# Patient Record
Sex: Female | Born: 1955
Health system: Southern US, Community
[De-identification: ages and names within clinical notes are randomized; demographics above are authoritative.]

## PROBLEM LIST (undated history)

## (undated) DIAGNOSIS — E1159 Type 2 diabetes mellitus with other circulatory complications: Secondary | ICD-10-CM

## (undated) DIAGNOSIS — K3184 Gastroparesis: Secondary | ICD-10-CM

## (undated) DIAGNOSIS — M545 Low back pain, unspecified: Secondary | ICD-10-CM

## (undated) DIAGNOSIS — N9489 Other specified conditions associated with female genital organs and menstrual cycle: Secondary | ICD-10-CM

## (undated) DIAGNOSIS — K760 Fatty (change of) liver, not elsewhere classified: Secondary | ICD-10-CM

## (undated) DIAGNOSIS — E1169 Type 2 diabetes mellitus with other specified complication: Secondary | ICD-10-CM

## (undated) DIAGNOSIS — E119 Type 2 diabetes mellitus without complications: Secondary | ICD-10-CM

## (undated) DIAGNOSIS — G35D Multiple sclerosis, unspecified: Secondary | ICD-10-CM

## (undated) DIAGNOSIS — K219 Gastro-esophageal reflux disease without esophagitis: Secondary | ICD-10-CM

## (undated) DIAGNOSIS — I10 Essential (primary) hypertension: Secondary | ICD-10-CM

## (undated) DIAGNOSIS — E669 Obesity, unspecified: Secondary | ICD-10-CM

## (undated) DIAGNOSIS — R0600 Dyspnea, unspecified: Secondary | ICD-10-CM

## (undated) DIAGNOSIS — G8929 Other chronic pain: Secondary | ICD-10-CM

## (undated) DIAGNOSIS — M1990 Unspecified osteoarthritis, unspecified site: Secondary | ICD-10-CM

## (undated) DIAGNOSIS — D509 Iron deficiency anemia, unspecified: Secondary | ICD-10-CM

## (undated) DIAGNOSIS — G35 Multiple sclerosis: Secondary | ICD-10-CM

## (undated) DIAGNOSIS — G4730 Sleep apnea, unspecified: Secondary | ICD-10-CM

## (undated) HISTORY — DX: Obesity, unspecified: E66.9

## (undated) HISTORY — DX: Other chronic pain: G89.29

## (undated) HISTORY — DX: Gastro-esophageal reflux disease without esophagitis: K21.9

## (undated) HISTORY — DX: Low back pain, unspecified: M54.50

## (undated) HISTORY — DX: Essential (primary) hypertension: I10

## (undated) HISTORY — PX: TENDON LENGTHENING: SHX395

## (undated) HISTORY — PX: UMBILICAL HERNIA REPAIR: SHX196

## (undated) HISTORY — PX: BLADDER SUSPENSION: SHX72

## (undated) HISTORY — PX: CHOLECYSTECTOMY: SHX55

## (undated) HISTORY — PX: KNEE ARTHROSCOPY W/ DEBRIDEMENT: SHX1867

## (undated) HISTORY — DX: Multiple sclerosis: G35

## (undated) HISTORY — DX: Sleep apnea, unspecified: G47.30

## (undated) HISTORY — DX: Iron deficiency anemia, unspecified: D50.9

## (undated) HISTORY — DX: Multiple sclerosis, unspecified: G35.D

## (undated) HISTORY — DX: Type 2 diabetes mellitus with other circulatory complications: E11.59

## (undated) HISTORY — DX: Other specified conditions associated with female genital organs and menstrual cycle: N94.89

## (undated) HISTORY — DX: Type 2 diabetes mellitus without complications: E11.9

## (undated) HISTORY — PX: LAPAROSCOPY ABDOMEN DIAGNOSTIC: PRO50

## (undated) HISTORY — DX: Fatty (change of) liver, not elsewhere classified: K76.0

## (undated) HISTORY — DX: Gastroparesis: K31.84

## (undated) HISTORY — DX: Low back pain: M54.5

## (undated) HISTORY — PX: ROTATOR CUFF REPAIR: SHX139

## (undated) HISTORY — DX: Type 2 diabetes mellitus with other specified complication: E11.69

## (undated) HISTORY — PX: DILATION AND CURETTAGE OF UTERUS: SHX78

---
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
## 2000-10-02 ENCOUNTER — Encounter: Payer: Self-pay | Admitting: Family Medicine

## 2000-10-02 ENCOUNTER — Ambulatory Visit (HOSPITAL_COMMUNITY): Admission: RE | Admit: 2000-10-02 | Discharge: 2000-10-02 | Payer: Self-pay | Admitting: Family Medicine

## 2000-11-12 ENCOUNTER — Ambulatory Visit (HOSPITAL_COMMUNITY): Admission: RE | Admit: 2000-11-12 | Discharge: 2000-11-12 | Payer: Self-pay | Admitting: Family Medicine

## 2000-11-12 ENCOUNTER — Encounter: Payer: Self-pay | Admitting: Family Medicine

## 2001-05-02 ENCOUNTER — Ambulatory Visit (HOSPITAL_COMMUNITY): Admission: RE | Admit: 2001-05-02 | Discharge: 2001-05-02 | Payer: Self-pay | Admitting: Family Medicine

## 2001-05-02 ENCOUNTER — Encounter: Payer: Self-pay | Admitting: Family Medicine

## 2001-05-15 ENCOUNTER — Ambulatory Visit (HOSPITAL_BASED_OUTPATIENT_CLINIC_OR_DEPARTMENT_OTHER): Admission: RE | Admit: 2001-05-15 | Discharge: 2001-05-15 | Payer: Self-pay | Admitting: Orthopedic Surgery

## 2001-10-24 ENCOUNTER — Ambulatory Visit (HOSPITAL_COMMUNITY): Admission: RE | Admit: 2001-10-24 | Discharge: 2001-10-24 | Payer: Self-pay | Admitting: Orthopedic Surgery

## 2001-10-24 ENCOUNTER — Encounter: Payer: Self-pay | Admitting: Orthopedic Surgery

## 2001-12-29 ENCOUNTER — Ambulatory Visit (HOSPITAL_COMMUNITY): Admission: RE | Admit: 2001-12-29 | Discharge: 2001-12-29 | Payer: Self-pay | Admitting: Unknown Physician Specialty

## 2001-12-29 ENCOUNTER — Encounter: Payer: Self-pay | Admitting: Family Medicine

## 2002-01-14 ENCOUNTER — Ambulatory Visit (HOSPITAL_BASED_OUTPATIENT_CLINIC_OR_DEPARTMENT_OTHER): Admission: RE | Admit: 2002-01-14 | Discharge: 2002-01-14 | Payer: Self-pay | Admitting: Orthopedic Surgery

## 2002-01-27 ENCOUNTER — Encounter (HOSPITAL_COMMUNITY): Admission: RE | Admit: 2002-01-27 | Discharge: 2002-02-26 | Payer: Self-pay | Admitting: Orthopedic Surgery

## 2002-02-27 ENCOUNTER — Encounter (HOSPITAL_COMMUNITY): Admission: RE | Admit: 2002-02-27 | Discharge: 2002-03-29 | Payer: Self-pay | Admitting: Orthopedic Surgery

## 2002-04-08 ENCOUNTER — Ambulatory Visit (HOSPITAL_COMMUNITY): Admission: RE | Admit: 2002-04-08 | Discharge: 2002-04-08 | Payer: Self-pay | Admitting: Orthopedic Surgery

## 2002-04-08 ENCOUNTER — Encounter: Payer: Self-pay | Admitting: Orthopedic Surgery

## 2002-04-16 ENCOUNTER — Ambulatory Visit (HOSPITAL_COMMUNITY): Admission: RE | Admit: 2002-04-16 | Discharge: 2002-04-16 | Payer: Self-pay | Admitting: Orthopedic Surgery

## 2002-04-16 ENCOUNTER — Encounter: Payer: Self-pay | Admitting: Orthopedic Surgery

## 2002-06-02 ENCOUNTER — Encounter: Payer: Self-pay | Admitting: Neurology

## 2002-06-02 ENCOUNTER — Ambulatory Visit (HOSPITAL_COMMUNITY): Admission: RE | Admit: 2002-06-02 | Discharge: 2002-06-02 | Payer: Self-pay | Admitting: Neurology

## 2002-06-10 ENCOUNTER — Encounter: Payer: Self-pay | Admitting: Family Medicine

## 2002-06-10 ENCOUNTER — Ambulatory Visit (HOSPITAL_COMMUNITY): Admission: RE | Admit: 2002-06-10 | Discharge: 2002-06-10 | Payer: Self-pay | Admitting: Family Medicine

## 2003-06-24 ENCOUNTER — Emergency Department (HOSPITAL_COMMUNITY): Admission: EM | Admit: 2003-06-24 | Discharge: 2003-06-25 | Payer: Self-pay | Admitting: Emergency Medicine

## 2003-07-08 ENCOUNTER — Ambulatory Visit (HOSPITAL_COMMUNITY): Admission: RE | Admit: 2003-07-08 | Discharge: 2003-07-08 | Payer: Self-pay | Admitting: Family Medicine

## 2003-08-13 ENCOUNTER — Ambulatory Visit (HOSPITAL_COMMUNITY): Admission: RE | Admit: 2003-08-13 | Discharge: 2003-08-13 | Payer: Self-pay | Admitting: Family Medicine

## 2003-11-02 ENCOUNTER — Other Ambulatory Visit: Admission: RE | Admit: 2003-11-02 | Discharge: 2003-11-02 | Payer: Self-pay | Admitting: Obstetrics and Gynecology

## 2003-12-17 ENCOUNTER — Ambulatory Visit (HOSPITAL_COMMUNITY): Admission: RE | Admit: 2003-12-17 | Discharge: 2003-12-17 | Payer: Self-pay | Admitting: Otolaryngology

## 2003-12-23 ENCOUNTER — Ambulatory Visit (HOSPITAL_COMMUNITY): Admission: RE | Admit: 2003-12-23 | Discharge: 2003-12-23 | Payer: Self-pay | Admitting: General Surgery

## 2004-01-26 ENCOUNTER — Ambulatory Visit: Payer: Self-pay | Admitting: Family Medicine

## 2004-04-11 ENCOUNTER — Ambulatory Visit: Payer: Self-pay | Admitting: Family Medicine

## 2004-06-20 ENCOUNTER — Ambulatory Visit: Payer: Self-pay | Admitting: Family Medicine

## 2004-06-30 ENCOUNTER — Ambulatory Visit (HOSPITAL_COMMUNITY): Admission: RE | Admit: 2004-06-30 | Discharge: 2004-06-30 | Payer: Self-pay | Admitting: Family Medicine

## 2004-07-13 ENCOUNTER — Ambulatory Visit (HOSPITAL_COMMUNITY): Admission: RE | Admit: 2004-07-13 | Discharge: 2004-07-13 | Payer: Self-pay | Admitting: Family Medicine

## 2004-07-17 ENCOUNTER — Ambulatory Visit (HOSPITAL_COMMUNITY): Admission: RE | Admit: 2004-07-17 | Discharge: 2004-07-17 | Payer: Self-pay | Admitting: Family Medicine

## 2004-09-19 ENCOUNTER — Ambulatory Visit: Payer: Self-pay | Admitting: Family Medicine

## 2004-10-03 ENCOUNTER — Ambulatory Visit: Payer: Self-pay | Admitting: Family Medicine

## 2004-10-05 ENCOUNTER — Ambulatory Visit: Payer: Self-pay | Admitting: Internal Medicine

## 2004-10-09 ENCOUNTER — Ambulatory Visit (HOSPITAL_COMMUNITY): Admission: RE | Admit: 2004-10-09 | Discharge: 2004-10-09 | Payer: Self-pay | Admitting: Internal Medicine

## 2004-10-09 ENCOUNTER — Ambulatory Visit: Payer: Self-pay | Admitting: Internal Medicine

## 2004-10-11 ENCOUNTER — Ambulatory Visit (HOSPITAL_COMMUNITY): Admission: RE | Admit: 2004-10-11 | Discharge: 2004-10-11 | Payer: Self-pay | Admitting: Internal Medicine

## 2004-11-07 ENCOUNTER — Ambulatory Visit: Payer: Self-pay | Admitting: Family Medicine

## 2004-11-15 ENCOUNTER — Ambulatory Visit: Payer: Self-pay | Admitting: Internal Medicine

## 2004-12-22 ENCOUNTER — Ambulatory Visit: Payer: Self-pay | Admitting: Family Medicine

## 2005-03-26 ENCOUNTER — Ambulatory Visit: Payer: Self-pay | Admitting: Internal Medicine

## 2005-04-06 ENCOUNTER — Ambulatory Visit: Payer: Self-pay | Admitting: Family Medicine

## 2005-05-09 ENCOUNTER — Encounter (HOSPITAL_COMMUNITY): Admission: RE | Admit: 2005-05-09 | Discharge: 2005-06-08 | Payer: Self-pay | Admitting: *Deleted

## 2005-05-18 ENCOUNTER — Ambulatory Visit: Payer: Self-pay | Admitting: Family Medicine

## 2005-05-21 ENCOUNTER — Ambulatory Visit: Admission: RE | Admit: 2005-05-21 | Discharge: 2005-05-21 | Payer: Self-pay | Admitting: *Deleted

## 2005-05-29 ENCOUNTER — Ambulatory Visit: Payer: Self-pay | Admitting: Pulmonary Disease

## 2005-07-02 ENCOUNTER — Ambulatory Visit (HOSPITAL_COMMUNITY): Admission: RE | Admit: 2005-07-02 | Discharge: 2005-07-02 | Payer: Self-pay | Admitting: Neurology

## 2005-07-12 ENCOUNTER — Ambulatory Visit: Payer: Self-pay | Admitting: Family Medicine

## 2005-07-12 ENCOUNTER — Ambulatory Visit (HOSPITAL_COMMUNITY): Admission: RE | Admit: 2005-07-12 | Discharge: 2005-07-12 | Payer: Self-pay | Admitting: Family Medicine

## 2005-07-19 ENCOUNTER — Ambulatory Visit (HOSPITAL_COMMUNITY): Admission: RE | Admit: 2005-07-19 | Discharge: 2005-07-19 | Payer: Self-pay | Admitting: Family Medicine

## 2005-10-12 ENCOUNTER — Ambulatory Visit: Payer: Self-pay | Admitting: Family Medicine

## 2005-10-30 ENCOUNTER — Ambulatory Visit: Payer: Self-pay | Admitting: Family Medicine

## 2005-12-20 ENCOUNTER — Ambulatory Visit: Payer: Self-pay | Admitting: Family Medicine

## 2005-12-21 ENCOUNTER — Ambulatory Visit (HOSPITAL_COMMUNITY): Admission: RE | Admit: 2005-12-21 | Discharge: 2005-12-21 | Payer: Self-pay | Admitting: Family Medicine

## 2005-12-24 ENCOUNTER — Other Ambulatory Visit: Admission: RE | Admit: 2005-12-24 | Discharge: 2005-12-24 | Payer: Self-pay | Admitting: Family Medicine

## 2005-12-24 ENCOUNTER — Encounter: Payer: Self-pay | Admitting: Family Medicine

## 2005-12-24 ENCOUNTER — Ambulatory Visit: Payer: Self-pay | Admitting: Family Medicine

## 2005-12-24 LAB — CONVERTED CEMR LAB: Pap Smear: NORMAL

## 2005-12-28 ENCOUNTER — Encounter: Payer: Self-pay | Admitting: Internal Medicine

## 2006-01-28 ENCOUNTER — Ambulatory Visit: Payer: Self-pay | Admitting: Family Medicine

## 2006-02-20 ENCOUNTER — Encounter: Payer: Self-pay | Admitting: Internal Medicine

## 2006-03-19 ENCOUNTER — Ambulatory Visit: Payer: Self-pay | Admitting: Internal Medicine

## 2006-04-29 ENCOUNTER — Ambulatory Visit: Payer: Self-pay | Admitting: Family Medicine

## 2006-04-29 LAB — CONVERTED CEMR LAB
ALT: 15 units/L (ref 0–35)
AST: 14 units/L (ref 0–37)
Albumin: 4.5 g/dL (ref 3.5–5.2)
Alkaline Phosphatase: 86 units/L (ref 39–117)
BUN: 24 mg/dL — ABNORMAL HIGH (ref 6–23)
Basophils Absolute: 0.1 10*3/uL (ref 0.0–0.1)
Basophils Relative: 1 % (ref 0–1)
Bilirubin, Direct: 0.1 mg/dL (ref 0.0–0.3)
CO2: 22 meq/L (ref 19–32)
Calcium: 9.7 mg/dL (ref 8.4–10.5)
Chloride: 104 meq/L (ref 96–112)
Cholesterol: 156 mg/dL (ref 0–200)
Creatinine, Ser: 0.77 mg/dL (ref 0.40–1.20)
Eosinophils Absolute: 0.1 10*3/uL (ref 0.0–0.7)
Eosinophils Relative: 1 % (ref 0–5)
Ferritin: 10 ng/mL (ref 10–291)
Folate: >20 ng/mL
Glucose, Bld: 102 mg/dL — ABNORMAL HIGH (ref 70–99)
HCT: 36.1 % (ref 36.0–46.0)
HDL: 56 mg/dL (ref >39)
Hemoglobin: 11.3 g/dL — ABNORMAL LOW (ref 12.0–15.0)
Hgb A1c MFr Bld: 7.2 % — ABNORMAL HIGH (ref 4.6–6.1)
Indirect Bilirubin: 0.3 mg/dL (ref 0.0–0.9)
Iron: 26 ug/dL — ABNORMAL LOW (ref 42–145)
LDL Cholesterol: 87 mg/dL (ref 0–99)
Lymphocytes Relative: 36 % (ref 12–46)
Lymphs Abs: 3 10*3/uL (ref 0.7–3.3)
MCHC: 31.3 g/dL (ref 30.0–36.0)
MCV: 78.3 fL (ref 78.0–100.0)
Microalb, Ur: 1.14 mg/dL (ref 0.00–1.89)
Monocytes Absolute: 0.4 10*3/uL (ref 0.2–0.7)
Monocytes Relative: 5 % (ref 3–11)
Neutro Abs: 4.8 10*3/uL (ref 1.7–7.7)
Neutrophils Relative %: 58 % (ref 43–77)
Platelets: 486 10*3/uL — ABNORMAL HIGH (ref 150–400)
Potassium: 4.5 meq/L (ref 3.5–5.3)
RBC: 4.61 M/uL (ref 3.87–5.11)
RDW: 17.9 % — ABNORMAL HIGH (ref 11.5–14.0)
Retic Count, Absolute: 55.3 (ref 19.0–186.0)
Retic Ct Pct: 1.2 % (ref 0.4–3.1)
Saturation Ratios: 6 % — ABNORMAL LOW (ref 20–55)
Sodium: 140 meq/L (ref 135–145)
TIBC: 401 ug/dL (ref 250–470)
Total Bilirubin: 0.4 mg/dL (ref 0.3–1.2)
Total CHOL/HDL Ratio: 2.8
Total Protein: 7.9 g/dL (ref 6.0–8.3)
Triglycerides: 64 mg/dL (ref <150)
UIBC: 375 ug/dL
VLDL: 13 mg/dL (ref 0–40)
Vitamin B-12: 456 pg/mL (ref 211–911)
WBC: 8.3 10*3/uL (ref 4.0–10.5)

## 2006-05-23 ENCOUNTER — Ambulatory Visit (HOSPITAL_COMMUNITY): Admission: RE | Admit: 2006-05-23 | Discharge: 2006-05-23 | Payer: Self-pay | Admitting: Family Medicine

## 2006-05-23 ENCOUNTER — Ambulatory Visit: Payer: Self-pay | Admitting: Family Medicine

## 2006-05-23 LAB — CONVERTED CEMR LAB
BUN: 10 mg/dL (ref 6–23)
Basophils Absolute: 0 10*3/uL (ref 0.0–0.1)
Basophils Relative: 0 % (ref 0–1)
CO2: 22 meq/L (ref 19–32)
Calcium: 8.8 mg/dL (ref 8.4–10.5)
Chloride: 99 meq/L (ref 96–112)
Creatinine, Ser: 1 mg/dL (ref 0.40–1.20)
Eosinophils Absolute: 0 10*3/uL (ref 0.0–0.7)
Eosinophils Relative: 0 % (ref 0–5)
Glucose, Bld: 110 mg/dL — ABNORMAL HIGH (ref 70–99)
HCT: 32.7 % — ABNORMAL LOW (ref 36.0–46.0)
Hemoglobin: 10.9 g/dL — ABNORMAL LOW (ref 12.0–15.0)
Lymphocytes Relative: 10 % — ABNORMAL LOW (ref 12–46)
Lymphs Abs: 1.4 10*3/uL (ref 0.7–3.3)
MCHC: 33.1 g/dL (ref 30.0–36.0)
MCV: 77.2 fL — ABNORMAL LOW (ref 78.0–100.0)
Monocytes Absolute: 0.9 10*3/uL — ABNORMAL HIGH (ref 0.2–0.7)
Monocytes Relative: 7 % (ref 3–11)
Neutro Abs: 10.1 10*3/uL — ABNORMAL HIGH (ref 1.7–7.7)
Neutrophils Relative %: 82 % — ABNORMAL HIGH (ref 43–77)
Platelets: 449 10*3/uL — ABNORMAL HIGH (ref 150–400)
Potassium: 3.7 meq/L (ref 3.5–5.3)
RBC: 4.24 M/uL (ref 3.87–5.11)
RDW: 18.6 % — ABNORMAL HIGH (ref 11.5–14.0)
Sodium: 131 meq/L — ABNORMAL LOW (ref 135–145)
WBC: 12.4 10*3/uL — ABNORMAL HIGH (ref 4.0–10.5)

## 2006-05-24 ENCOUNTER — Encounter: Payer: Self-pay | Admitting: Family Medicine

## 2006-06-05 ENCOUNTER — Ambulatory Visit: Payer: Self-pay | Admitting: Family Medicine

## 2006-06-05 LAB — CONVERTED CEMR LAB
Basophils Absolute: 0 10*3/uL (ref 0.0–0.1)
Basophils Relative: 1 % (ref 0–1)
Eosinophils Absolute: 0 10*3/uL (ref 0.0–0.7)
Eosinophils Relative: 0 % (ref 0–5)
HCT: 35.6 % — ABNORMAL LOW (ref 36.0–46.0)
Hemoglobin: 11.3 g/dL — ABNORMAL LOW (ref 12.0–15.0)
Lymphocytes Relative: 32 % (ref 12–46)
Lymphs Abs: 2.8 10*3/uL (ref 0.7–3.3)
MCHC: 31.7 g/dL (ref 30.0–36.0)
MCV: 79.1 fL (ref 78.0–100.0)
Monocytes Absolute: 0.4 10*3/uL (ref 0.2–0.7)
Monocytes Relative: 4 % (ref 3–11)
Neutro Abs: 5.4 10*3/uL (ref 1.7–7.7)
Neutrophils Relative %: 63 % (ref 43–77)
Platelets: 541 10*3/uL — ABNORMAL HIGH (ref 150–400)
RBC: 4.5 M/uL (ref 3.87–5.11)
RDW: 19.3 % — ABNORMAL HIGH (ref 11.5–14.0)
WBC: 8.5 10*3/uL (ref 4.0–10.5)

## 2006-06-20 ENCOUNTER — Encounter: Payer: Self-pay | Admitting: Family Medicine

## 2006-07-01 ENCOUNTER — Ambulatory Visit (HOSPITAL_COMMUNITY): Admission: RE | Admit: 2006-07-01 | Discharge: 2006-07-01 | Payer: Self-pay | Admitting: Family Medicine

## 2006-07-02 ENCOUNTER — Ambulatory Visit (HOSPITAL_COMMUNITY): Admission: RE | Admit: 2006-07-02 | Discharge: 2006-07-02 | Payer: Self-pay | Admitting: Family Medicine

## 2006-07-22 ENCOUNTER — Ambulatory Visit (HOSPITAL_COMMUNITY): Admission: RE | Admit: 2006-07-22 | Discharge: 2006-07-22 | Payer: Self-pay | Admitting: Family Medicine

## 2006-08-01 ENCOUNTER — Encounter: Payer: Self-pay | Admitting: Family Medicine

## 2006-08-01 LAB — CONVERTED CEMR LAB
BUN: 17 mg/dL (ref 6–23)
CO2: 19 meq/L (ref 19–32)
Calcium: 9.7 mg/dL (ref 8.4–10.5)
Chloride: 106 meq/L (ref 96–112)
Creatinine, Ser: 0.72 mg/dL (ref 0.40–1.20)
Glucose, Bld: 86 mg/dL (ref 70–99)
Hgb A1c MFr Bld: 6.6 % — ABNORMAL HIGH (ref 4.6–6.1)
Potassium: 4.9 meq/L (ref 3.5–5.3)
Sodium: 141 meq/L (ref 135–145)

## 2006-08-07 ENCOUNTER — Ambulatory Visit: Payer: Self-pay | Admitting: Family Medicine

## 2006-12-11 ENCOUNTER — Ambulatory Visit: Payer: Self-pay | Admitting: Family Medicine

## 2006-12-18 ENCOUNTER — Ambulatory Visit (HOSPITAL_COMMUNITY): Admission: RE | Admit: 2006-12-18 | Discharge: 2006-12-18 | Payer: Self-pay | Admitting: Family Medicine

## 2007-02-11 ENCOUNTER — Ambulatory Visit: Payer: Self-pay | Admitting: Family Medicine

## 2007-02-21 ENCOUNTER — Encounter: Payer: Self-pay | Admitting: Family Medicine

## 2007-03-13 DIAGNOSIS — G4730 Sleep apnea, unspecified: Secondary | ICD-10-CM

## 2007-03-13 HISTORY — DX: Sleep apnea, unspecified: G47.30

## 2007-03-14 ENCOUNTER — Encounter: Payer: Self-pay | Admitting: Family Medicine

## 2007-03-14 LAB — CONVERTED CEMR LAB
ALT: 38 units/L — ABNORMAL HIGH (ref 0–35)
AST: 28 units/L (ref 0–37)
Albumin: 4.4 g/dL (ref 3.5–5.2)
Alkaline Phosphatase: 86 units/L (ref 39–117)
BUN: 10 mg/dL (ref 6–23)
Bilirubin, Direct: 0.1 mg/dL (ref 0.0–0.3)
CO2: 23 meq/L (ref 19–32)
Calcium: 9.3 mg/dL (ref 8.4–10.5)
Chloride: 106 meq/L (ref 96–112)
Cholesterol: 177 mg/dL (ref 0–200)
Creatinine, Ser: 0.68 mg/dL (ref 0.40–1.20)
Glucose, Bld: 109 mg/dL — ABNORMAL HIGH (ref 70–99)
HDL: 51 mg/dL (ref >39)
Indirect Bilirubin: 0.2 mg/dL (ref 0.0–0.9)
LDL Cholesterol: 106 mg/dL — ABNORMAL HIGH (ref 0–99)
Potassium: 4.5 meq/L (ref 3.5–5.3)
Sodium: 141 meq/L (ref 135–145)
Total Bilirubin: 0.3 mg/dL (ref 0.3–1.2)
Total CHOL/HDL Ratio: 3.5
Total Protein: 7.7 g/dL (ref 6.0–8.3)
Triglycerides: 98 mg/dL (ref <150)
VLDL: 20 mg/dL (ref 0–40)

## 2007-03-17 ENCOUNTER — Encounter: Payer: Self-pay | Admitting: Family Medicine

## 2007-03-17 ENCOUNTER — Ambulatory Visit: Payer: Self-pay | Admitting: Family Medicine

## 2007-03-17 ENCOUNTER — Other Ambulatory Visit: Admission: RE | Admit: 2007-03-17 | Discharge: 2007-03-17 | Payer: Self-pay | Admitting: Family Medicine

## 2007-03-17 ENCOUNTER — Ambulatory Visit (HOSPITAL_COMMUNITY): Admission: RE | Admit: 2007-03-17 | Discharge: 2007-03-17 | Payer: Self-pay | Admitting: Family Medicine

## 2007-03-17 LAB — CONVERTED CEMR LAB: Pap Smear: NORMAL

## 2007-03-20 ENCOUNTER — Ambulatory Visit (HOSPITAL_COMMUNITY): Admission: RE | Admit: 2007-03-20 | Discharge: 2007-03-20 | Payer: Self-pay | Admitting: Family Medicine

## 2007-04-18 ENCOUNTER — Ambulatory Visit: Payer: Self-pay | Admitting: Internal Medicine

## 2007-04-18 DIAGNOSIS — J309 Allergic rhinitis, unspecified: Secondary | ICD-10-CM | POA: Insufficient documentation

## 2007-04-18 LAB — CONVERTED CEMR LAB
Basophils Absolute: 0.1 10*3/uL (ref 0.0–0.1)
Basophils Relative: 1 % (ref 0.0–1.0)
Eosinophils Absolute: 0 10*3/uL (ref 0.0–0.6)
Eosinophils Relative: 0.4 % (ref 0.0–5.0)
HCT: 35.1 % — ABNORMAL LOW (ref 36.0–46.0)
Hemoglobin: 11.5 g/dL — ABNORMAL LOW (ref 12.0–15.0)
IgE (Immunoglobulin E), Serum: 132 intl units/mL (ref 0.0–180.0)
Lymphocytes Relative: 30 % (ref 12.0–46.0)
MCHC: 32.9 g/dL (ref 30.0–36.0)
MCV: 81.7 fL (ref 78.0–100.0)
Monocytes Absolute: 0.5 10*3/uL (ref 0.2–0.7)
Monocytes Relative: 7.6 % (ref 3.0–11.0)
Neutro Abs: 4.3 10*3/uL (ref 1.4–7.7)
Neutrophils Relative %: 61 % (ref 43.0–77.0)
Platelets: 446 10*3/uL — ABNORMAL HIGH (ref 150–400)
RBC: 4.3 M/uL (ref 3.87–5.11)
RDW: 14.6 % (ref 11.5–14.6)
WBC: 7 10*3/uL (ref 4.5–10.5)

## 2007-04-20 DIAGNOSIS — E1143 Type 2 diabetes mellitus with diabetic autonomic (poly)neuropathy: Secondary | ICD-10-CM | POA: Insufficient documentation

## 2007-04-20 DIAGNOSIS — I10 Essential (primary) hypertension: Secondary | ICD-10-CM | POA: Insufficient documentation

## 2007-04-20 DIAGNOSIS — E1165 Type 2 diabetes mellitus with hyperglycemia: Secondary | ICD-10-CM

## 2007-05-09 ENCOUNTER — Encounter: Payer: Self-pay | Admitting: Internal Medicine

## 2007-05-09 ENCOUNTER — Ambulatory Visit: Admission: RE | Admit: 2007-05-09 | Discharge: 2007-05-09 | Payer: Self-pay | Admitting: Internal Medicine

## 2007-05-15 ENCOUNTER — Ambulatory Visit: Payer: Self-pay | Admitting: Family Medicine

## 2007-05-16 ENCOUNTER — Ambulatory Visit: Payer: Self-pay | Admitting: Internal Medicine

## 2007-05-16 DIAGNOSIS — G4733 Obstructive sleep apnea (adult) (pediatric): Secondary | ICD-10-CM | POA: Insufficient documentation

## 2007-05-17 ENCOUNTER — Ambulatory Visit: Payer: Self-pay | Admitting: Internal Medicine

## 2007-06-03 ENCOUNTER — Encounter: Payer: Self-pay | Admitting: Internal Medicine

## 2007-06-16 ENCOUNTER — Ambulatory Visit: Payer: Self-pay | Admitting: Internal Medicine

## 2007-06-16 DIAGNOSIS — G35 Multiple sclerosis: Secondary | ICD-10-CM | POA: Insufficient documentation

## 2007-06-26 ENCOUNTER — Ambulatory Visit: Payer: Self-pay | Admitting: Family Medicine

## 2007-06-27 ENCOUNTER — Encounter: Payer: Self-pay | Admitting: Family Medicine

## 2007-06-27 LAB — CONVERTED CEMR LAB: Microalb, Ur: 3.1 mg/dL — ABNORMAL HIGH (ref 0.00–1.89)

## 2007-07-24 ENCOUNTER — Ambulatory Visit (HOSPITAL_COMMUNITY): Admission: RE | Admit: 2007-07-24 | Discharge: 2007-07-24 | Payer: Self-pay | Admitting: Family Medicine

## 2007-12-19 ENCOUNTER — Ambulatory Visit: Payer: Self-pay | Admitting: Family Medicine

## 2007-12-19 LAB — CONVERTED CEMR LAB
Blood Glucose, Fasting: 159 mg/dL
Hgb A1c MFr Bld: 8.8 %

## 2007-12-29 ENCOUNTER — Encounter: Payer: Self-pay | Admitting: Family Medicine

## 2007-12-30 ENCOUNTER — Encounter: Payer: Self-pay | Admitting: Family Medicine

## 2007-12-30 LAB — CONVERTED CEMR LAB
ALT: 37 units/L — ABNORMAL HIGH (ref 0–35)
AST: 24 units/L (ref 0–37)
Albumin: 4.5 g/dL (ref 3.5–5.2)
Alkaline Phosphatase: 72 units/L (ref 39–117)
BUN: 11 mg/dL (ref 6–23)
Bilirubin, Direct: 0.1 mg/dL (ref 0.0–0.3)
CO2: 20 meq/L (ref 19–32)
Calcium: 9.5 mg/dL (ref 8.4–10.5)
Chloride: 106 meq/L (ref 96–112)
Cholesterol: 156 mg/dL (ref 0–200)
Creatinine, Ser: 0.74 mg/dL (ref 0.40–1.20)
Glucose, Bld: 94 mg/dL (ref 70–99)
HDL: 57 mg/dL (ref >39)
Indirect Bilirubin: 0.2 mg/dL (ref 0.0–0.9)
LDL Cholesterol: 89 mg/dL (ref 0–99)
Potassium: 4.6 meq/L (ref 3.5–5.3)
Sodium: 140 meq/L (ref 135–145)
Total Bilirubin: 0.3 mg/dL (ref 0.3–1.2)
Total CHOL/HDL Ratio: 2.7
Total Protein: 7.4 g/dL (ref 6.0–8.3)
Triglycerides: 49 mg/dL (ref <150)
VLDL: 10 mg/dL (ref 0–40)

## 2008-02-02 ENCOUNTER — Ambulatory Visit: Payer: Self-pay | Admitting: Family Medicine

## 2008-02-04 LAB — CONVERTED CEMR LAB
ALT: 20 units/L (ref 0–35)
AST: 17 units/L (ref 0–37)
Albumin: 4.6 g/dL (ref 3.5–5.2)
Alkaline Phosphatase: 79 units/L (ref 39–117)
Bilirubin, Direct: 0.1 mg/dL (ref 0.0–0.3)
Indirect Bilirubin: 0.3 mg/dL (ref 0.0–0.9)
Total Bilirubin: 0.4 mg/dL (ref 0.3–1.2)
Total Protein: 7.7 g/dL (ref 6.0–8.3)

## 2008-03-12 HISTORY — PX: ESOPHAGOGASTRODUODENOSCOPY: SHX1529

## 2008-03-12 HISTORY — PX: COLONOSCOPY: SHX174

## 2008-03-31 ENCOUNTER — Ambulatory Visit: Payer: Self-pay | Admitting: Family Medicine

## 2008-03-31 LAB — CONVERTED CEMR LAB
Blood Glucose, Fasting: 118 mg/dL
Hgb A1c MFr Bld: 6.7 %

## 2008-04-21 ENCOUNTER — Encounter: Payer: Self-pay | Admitting: Family Medicine

## 2008-05-12 ENCOUNTER — Other Ambulatory Visit: Admission: RE | Admit: 2008-05-12 | Discharge: 2008-05-12 | Payer: Self-pay | Admitting: Obstetrics and Gynecology

## 2008-05-25 ENCOUNTER — Ambulatory Visit (HOSPITAL_COMMUNITY): Admission: RE | Admit: 2008-05-25 | Discharge: 2008-05-25 | Payer: Self-pay | Admitting: Obstetrics and Gynecology

## 2008-06-21 ENCOUNTER — Telehealth: Payer: Self-pay | Admitting: Family Medicine

## 2008-07-01 ENCOUNTER — Ambulatory Visit: Payer: Self-pay | Admitting: Family Medicine

## 2008-07-01 LAB — CONVERTED CEMR LAB
Blood Glucose, Fasting: 97 mg/dL
Hgb A1c MFr Bld: 7 %

## 2008-07-06 LAB — CONVERTED CEMR LAB
ALT: 18 units/L (ref 0–35)
AST: 18 units/L (ref 0–37)
Albumin: 4.3 g/dL (ref 3.5–5.2)
Alkaline Phosphatase: 84 units/L (ref 39–117)
BUN: 11 mg/dL (ref 6–23)
Basophils Absolute: 0.1 10*3/uL (ref 0.0–0.1)
Basophils Relative: 1 % (ref 0–1)
Bilirubin, Direct: 0.1 mg/dL (ref 0.0–0.3)
CO2: 21 meq/L (ref 19–32)
Calcium: 9.3 mg/dL (ref 8.4–10.5)
Chloride: 103 meq/L (ref 96–112)
Cholesterol: 171 mg/dL (ref 0–200)
Creatinine, Ser: 0.66 mg/dL (ref 0.40–1.20)
Creatinine, Urine: 163.6 mg/dL
Eosinophils Absolute: 0.1 10*3/uL (ref 0.0–0.7)
Eosinophils Relative: 1 % (ref 0–5)
Glucose, Bld: 73 mg/dL (ref 70–99)
HCT: 34.6 % — ABNORMAL LOW (ref 36.0–46.0)
HDL: 60 mg/dL (ref >39)
Hemoglobin: 11.3 g/dL — ABNORMAL LOW (ref 12.0–15.0)
Indirect Bilirubin: 0.2 mg/dL (ref 0.0–0.9)
LDL Cholesterol: 97 mg/dL (ref 0–99)
Lymphocytes Relative: 20 % (ref 12–46)
Lymphs Abs: 2 10*3/uL (ref 0.7–4.0)
MCHC: 32.7 g/dL (ref 30.0–36.0)
MCV: 82.2 fL (ref 78.0–100.0)
Microalb Creat Ratio: 20.2 mg/g (ref 0.0–30.0)
Microalb, Ur: 3.3 mg/dL — ABNORMAL HIGH (ref 0.00–1.89)
Monocytes Absolute: 0.7 10*3/uL (ref 0.1–1.0)
Monocytes Relative: 7 % (ref 3–12)
Neutro Abs: 7 10*3/uL (ref 1.7–7.7)
Neutrophils Relative %: 72 % (ref 43–77)
Platelets: 442 10*3/uL — ABNORMAL HIGH (ref 150–400)
Potassium: 4 meq/L (ref 3.5–5.3)
RBC: 4.21 M/uL (ref 3.87–5.11)
RDW: 16.4 % — ABNORMAL HIGH (ref 11.5–15.5)
Sodium: 138 meq/L (ref 135–145)
TSH: 3.256 microintl units/mL (ref 0.350–4.500)
Total Bilirubin: 0.3 mg/dL (ref 0.3–1.2)
Total CHOL/HDL Ratio: 2.9
Total Protein: 7.9 g/dL (ref 6.0–8.3)
Triglycerides: 70 mg/dL (ref <150)
VLDL: 14 mg/dL (ref 0–40)
WBC: 9.8 10*3/uL (ref 4.0–10.5)

## 2008-07-26 ENCOUNTER — Ambulatory Visit (HOSPITAL_COMMUNITY): Admission: RE | Admit: 2008-07-26 | Discharge: 2008-07-26 | Payer: Self-pay | Admitting: Family Medicine

## 2008-08-06 ENCOUNTER — Encounter: Payer: Self-pay | Admitting: Family Medicine

## 2008-09-07 ENCOUNTER — Ambulatory Visit: Payer: Self-pay | Admitting: Family Medicine

## 2008-09-07 LAB — CONVERTED CEMR LAB: Glucose, Bld: 129 mg/dL

## 2008-09-13 DIAGNOSIS — F4321 Adjustment disorder with depressed mood: Secondary | ICD-10-CM | POA: Insufficient documentation

## 2008-09-21 ENCOUNTER — Telehealth: Payer: Self-pay | Admitting: Family Medicine

## 2008-10-05 ENCOUNTER — Ambulatory Visit: Payer: Self-pay | Admitting: Family Medicine

## 2008-10-05 LAB — CONVERTED CEMR LAB
Glucose, Bld: 205 mg/dL
Hgb A1c MFr Bld: 7.8 %

## 2008-11-01 DIAGNOSIS — K219 Gastro-esophageal reflux disease without esophagitis: Secondary | ICD-10-CM | POA: Insufficient documentation

## 2008-11-01 DIAGNOSIS — K3184 Gastroparesis: Secondary | ICD-10-CM | POA: Insufficient documentation

## 2008-11-02 ENCOUNTER — Ambulatory Visit: Payer: Self-pay | Admitting: Internal Medicine

## 2008-11-02 DIAGNOSIS — K921 Melena: Secondary | ICD-10-CM | POA: Insufficient documentation

## 2008-11-02 DIAGNOSIS — D509 Iron deficiency anemia, unspecified: Secondary | ICD-10-CM | POA: Insufficient documentation

## 2008-11-02 DIAGNOSIS — R1013 Epigastric pain: Secondary | ICD-10-CM | POA: Insufficient documentation

## 2008-11-03 ENCOUNTER — Ambulatory Visit: Payer: Self-pay | Admitting: Internal Medicine

## 2008-11-09 ENCOUNTER — Encounter: Payer: Self-pay | Admitting: Internal Medicine

## 2008-11-26 ENCOUNTER — Encounter: Payer: Self-pay | Admitting: Internal Medicine

## 2008-11-26 ENCOUNTER — Ambulatory Visit (HOSPITAL_COMMUNITY): Admission: RE | Admit: 2008-11-26 | Discharge: 2008-11-26 | Payer: Self-pay | Admitting: Internal Medicine

## 2008-11-26 ENCOUNTER — Ambulatory Visit: Payer: Self-pay | Admitting: Internal Medicine

## 2008-11-30 ENCOUNTER — Encounter: Payer: Self-pay | Admitting: Internal Medicine

## 2008-12-01 ENCOUNTER — Ambulatory Visit (HOSPITAL_COMMUNITY): Admission: RE | Admit: 2008-12-01 | Discharge: 2008-12-01 | Payer: Self-pay | Admitting: Internal Medicine

## 2008-12-01 ENCOUNTER — Encounter: Payer: Self-pay | Admitting: Internal Medicine

## 2008-12-29 ENCOUNTER — Ambulatory Visit: Payer: Self-pay | Admitting: Family Medicine

## 2008-12-29 ENCOUNTER — Ambulatory Visit: Payer: Self-pay | Admitting: Gastroenterology

## 2008-12-29 DIAGNOSIS — R740 Nonspecific elevation of levels of transaminase and lactic acid dehydrogenase [LDH]: Secondary | ICD-10-CM

## 2008-12-29 DIAGNOSIS — R7401 Elevation of levels of liver transaminase levels: Secondary | ICD-10-CM | POA: Insufficient documentation

## 2008-12-29 DIAGNOSIS — R7402 Elevation of levels of lactic acid dehydrogenase (LDH): Secondary | ICD-10-CM | POA: Insufficient documentation

## 2009-01-04 ENCOUNTER — Encounter: Payer: Self-pay | Admitting: Internal Medicine

## 2009-01-10 ENCOUNTER — Encounter: Payer: Self-pay | Admitting: Family Medicine

## 2009-01-10 LAB — CONVERTED CEMR LAB
ALT: 51 units/L — ABNORMAL HIGH (ref 0–35)
AST: 31 units/L (ref 0–37)
Albumin: 4.9 g/dL (ref 3.5–5.2)
Alkaline Phosphatase: 82 units/L (ref 39–117)
BUN: 12 mg/dL (ref 6–23)
Bilirubin, Direct: 0.1 mg/dL (ref 0.0–0.3)
CO2: 20 meq/L (ref 19–32)
Calcium: 9.9 mg/dL (ref 8.4–10.5)
Chloride: 102 meq/L (ref 96–112)
Cholesterol: 171 mg/dL (ref 0–200)
Creatinine, Ser: 0.7 mg/dL (ref 0.40–1.20)
Glucose, Bld: 106 mg/dL — ABNORMAL HIGH (ref 70–99)
HDL: 57 mg/dL (ref >39)
Indirect Bilirubin: 0.2 mg/dL (ref 0.0–0.9)
LDL Cholesterol: 97 mg/dL (ref 0–99)
Potassium: 3.9 meq/L (ref 3.5–5.3)
Sodium: 141 meq/L (ref 135–145)
Total Bilirubin: 0.3 mg/dL (ref 0.3–1.2)
Total CHOL/HDL Ratio: 3
Total Protein: 8.2 g/dL (ref 6.0–8.3)
Triglycerides: 83 mg/dL (ref <150)
VLDL: 17 mg/dL (ref 0–40)

## 2009-01-11 ENCOUNTER — Encounter: Payer: Self-pay | Admitting: Gastroenterology

## 2009-01-12 ENCOUNTER — Ambulatory Visit: Payer: Self-pay | Admitting: Family Medicine

## 2009-01-12 ENCOUNTER — Encounter: Payer: Self-pay | Admitting: Gastroenterology

## 2009-01-12 DIAGNOSIS — N3000 Acute cystitis without hematuria: Secondary | ICD-10-CM | POA: Insufficient documentation

## 2009-01-12 LAB — CONVERTED CEMR LAB
Blood in Urine, dipstick: NEGATIVE
Glucose, Bld: 161 mg/dL
Glucose, Urine, Semiquant: NEGATIVE
Hgb A1c MFr Bld: 8 %
Nitrite: NEGATIVE
Specific Gravity, Urine: 1.025
Urobilinogen, UA: 0.2
WBC Urine, dipstick: NEGATIVE
pH: 5.5

## 2009-01-14 LAB — CONVERTED CEMR LAB
Ferritin: 22 ng/mL (ref 10–291)
HCV Ab: NEGATIVE
Hepatitis B Surface Ag: NEGATIVE
Iron: 24 ug/dL — ABNORMAL LOW (ref 42–145)
Saturation Ratios: 6 % — ABNORMAL LOW (ref 20–55)
TIBC: 378 ug/dL (ref 250–470)
UIBC: 354 ug/dL

## 2009-01-26 ENCOUNTER — Telehealth: Payer: Self-pay | Admitting: Family Medicine

## 2009-01-26 ENCOUNTER — Encounter (INDEPENDENT_AMBULATORY_CARE_PROVIDER_SITE_OTHER): Payer: Self-pay

## 2009-03-02 ENCOUNTER — Ambulatory Visit: Payer: Self-pay | Admitting: Family Medicine

## 2009-03-02 LAB — CONVERTED CEMR LAB: Glucose, Bld: 91 mg/dL

## 2009-03-08 ENCOUNTER — Encounter: Payer: Self-pay | Admitting: Family Medicine

## 2009-03-12 DIAGNOSIS — I10 Essential (primary) hypertension: Secondary | ICD-10-CM

## 2009-03-12 DIAGNOSIS — E669 Obesity, unspecified: Secondary | ICD-10-CM

## 2009-03-12 DIAGNOSIS — E1169 Type 2 diabetes mellitus with other specified complication: Secondary | ICD-10-CM

## 2009-03-12 DIAGNOSIS — E1159 Type 2 diabetes mellitus with other circulatory complications: Secondary | ICD-10-CM

## 2009-03-12 HISTORY — DX: Type 2 diabetes mellitus with other specified complication: E11.69

## 2009-03-12 HISTORY — DX: Obesity, unspecified: E66.9

## 2009-04-01 ENCOUNTER — Telehealth: Payer: Self-pay | Admitting: Family Medicine

## 2009-04-04 ENCOUNTER — Encounter: Payer: Self-pay | Admitting: Family Medicine

## 2009-04-13 ENCOUNTER — Ambulatory Visit (HOSPITAL_COMMUNITY): Admission: RE | Admit: 2009-04-13 | Discharge: 2009-04-13 | Payer: Self-pay | Admitting: Neurology

## 2009-04-14 ENCOUNTER — Encounter: Payer: Self-pay | Admitting: Gastroenterology

## 2009-04-22 ENCOUNTER — Encounter: Payer: Self-pay | Admitting: Gastroenterology

## 2009-04-22 LAB — CONVERTED CEMR LAB
Basophils Absolute: 0.1 10*3/uL (ref 0.0–0.1)
Basophils Relative: 1 % (ref 0–1)
Eosinophils Absolute: 0 10*3/uL (ref 0.0–0.7)
Eosinophils Relative: 1 % (ref 0–5)
HCT: 35.3 % — ABNORMAL LOW (ref 36.0–46.0)
Hemoglobin: 11.3 g/dL — ABNORMAL LOW (ref 12.0–15.0)
Lymphocytes Relative: 39 % (ref 12–46)
Lymphs Abs: 2.6 10*3/uL (ref 0.7–4.0)
MCHC: 32 g/dL (ref 30.0–36.0)
MCV: 80.6 fL (ref 78.0–100.0)
Monocytes Absolute: 0.3 10*3/uL (ref 0.1–1.0)
Monocytes Relative: 4 % (ref 3–12)
Neutro Abs: 3.7 10*3/uL (ref 1.7–7.7)
Neutrophils Relative %: 55 % (ref 43–77)
Platelets: 485 10*3/uL — ABNORMAL HIGH (ref 150–400)
RBC: 4.38 M/uL (ref 3.87–5.11)
RDW: 18.5 % — ABNORMAL HIGH (ref 11.5–15.5)
WBC: 6.6 10*3/uL (ref 4.0–10.5)

## 2009-05-02 ENCOUNTER — Encounter (INDEPENDENT_AMBULATORY_CARE_PROVIDER_SITE_OTHER): Payer: Self-pay

## 2009-05-03 ENCOUNTER — Ambulatory Visit: Payer: Self-pay | Admitting: Family Medicine

## 2009-05-03 DIAGNOSIS — M25559 Pain in unspecified hip: Secondary | ICD-10-CM | POA: Insufficient documentation

## 2009-05-03 LAB — CONVERTED CEMR LAB: Glucose, Bld: 122 mg/dL

## 2009-05-06 ENCOUNTER — Encounter (INDEPENDENT_AMBULATORY_CARE_PROVIDER_SITE_OTHER): Payer: Self-pay | Admitting: *Deleted

## 2009-06-15 ENCOUNTER — Telehealth: Payer: Self-pay | Admitting: Family Medicine

## 2009-06-27 ENCOUNTER — Encounter: Payer: Self-pay | Admitting: Gastroenterology

## 2009-06-27 ENCOUNTER — Encounter (INDEPENDENT_AMBULATORY_CARE_PROVIDER_SITE_OTHER): Payer: Self-pay | Admitting: *Deleted

## 2009-06-28 ENCOUNTER — Telehealth (INDEPENDENT_AMBULATORY_CARE_PROVIDER_SITE_OTHER): Payer: Self-pay

## 2009-07-12 ENCOUNTER — Ambulatory Visit: Payer: Self-pay | Admitting: Internal Medicine

## 2009-07-12 LAB — CONVERTED CEMR LAB
ALT: 13 units/L (ref 0–35)
AST: 14 units/L (ref 0–37)
Albumin: 4.2 g/dL (ref 3.5–5.2)
Alkaline Phosphatase: 78 units/L (ref 39–117)
Basophils Absolute: 0.1 10*3/uL (ref 0.0–0.1)
Basophils Relative: 1 % (ref 0–1)
Bilirubin, Direct: 0.1 mg/dL (ref 0.0–0.3)
Eosinophils Absolute: 0 10*3/uL (ref 0.0–0.7)
Eosinophils Relative: 0 % (ref 0–5)
HCT: 33.4 % — ABNORMAL LOW (ref 36.0–46.0)
Hemoglobin: 10.9 g/dL — ABNORMAL LOW (ref 12.0–15.0)
Indirect Bilirubin: 0.3 mg/dL (ref 0.0–0.9)
Lymphocytes Relative: 26 % (ref 12–46)
Lymphs Abs: 2.5 10*3/uL (ref 0.7–4.0)
MCHC: 32.6 g/dL (ref 30.0–36.0)
MCV: 80.9 fL (ref 78.0–100.0)
Monocytes Absolute: 0.4 10*3/uL (ref 0.1–1.0)
Monocytes Relative: 4 % (ref 3–12)
Neutro Abs: 6.7 10*3/uL (ref 1.7–7.7)
Neutrophils Relative %: 69 % (ref 43–77)
Platelets: 440 10*3/uL — ABNORMAL HIGH (ref 150–400)
RBC: 4.13 M/uL (ref 3.87–5.11)
RDW: 15.5 % (ref 11.5–15.5)
Total Bilirubin: 0.4 mg/dL (ref 0.3–1.2)
Total Protein: 7.3 g/dL (ref 6.0–8.3)
WBC: 9.8 10*3/uL (ref 4.0–10.5)

## 2009-07-13 ENCOUNTER — Encounter: Payer: Self-pay | Admitting: Internal Medicine

## 2009-07-13 DIAGNOSIS — Z860100 Personal history of colon polyps, unspecified: Secondary | ICD-10-CM | POA: Insufficient documentation

## 2009-07-13 DIAGNOSIS — K760 Fatty (change of) liver, not elsewhere classified: Secondary | ICD-10-CM | POA: Insufficient documentation

## 2009-07-27 ENCOUNTER — Ambulatory Visit (HOSPITAL_COMMUNITY): Admission: RE | Admit: 2009-07-27 | Discharge: 2009-07-27 | Payer: Self-pay | Admitting: Family Medicine

## 2009-08-01 ENCOUNTER — Ambulatory Visit: Payer: Self-pay | Admitting: Family Medicine

## 2009-08-01 DIAGNOSIS — M5441 Lumbago with sciatica, right side: Secondary | ICD-10-CM | POA: Insufficient documentation

## 2009-08-03 LAB — CONVERTED CEMR LAB
BUN: 9 mg/dL (ref 6–23)
CO2: 21 meq/L (ref 19–32)
Calcium: 9.6 mg/dL (ref 8.4–10.5)
Chloride: 103 meq/L (ref 96–112)
Creatinine, Ser: 0.67 mg/dL (ref 0.40–1.20)
Creatinine, Urine: 156.3 mg/dL
Glucose, Bld: 103 mg/dL — ABNORMAL HIGH (ref 70–99)
Hgb A1c MFr Bld: 6.8 % — ABNORMAL HIGH (ref <5.7)
Microalb Creat Ratio: 4.4 mg/g (ref 0.0–30.0)
Microalb, Ur: 0.68 mg/dL (ref 0.00–1.89)
Potassium: 4.1 meq/L (ref 3.5–5.3)
Sodium: 138 meq/L (ref 135–145)
Vit D, 25-Hydroxy: 32 ng/mL (ref 30–89)

## 2009-08-25 ENCOUNTER — Telehealth: Payer: Self-pay | Admitting: Family Medicine

## 2009-08-29 ENCOUNTER — Encounter: Payer: Self-pay | Admitting: Family Medicine

## 2009-09-19 ENCOUNTER — Ambulatory Visit: Payer: Self-pay | Admitting: Family Medicine

## 2009-09-22 ENCOUNTER — Ambulatory Visit (HOSPITAL_COMMUNITY): Admission: RE | Admit: 2009-09-22 | Discharge: 2009-09-22 | Payer: Self-pay | Admitting: Family Medicine

## 2009-09-26 ENCOUNTER — Telehealth: Payer: Self-pay | Admitting: Family Medicine

## 2009-10-05 ENCOUNTER — Encounter (INDEPENDENT_AMBULATORY_CARE_PROVIDER_SITE_OTHER): Payer: Self-pay

## 2009-10-17 LAB — CONVERTED CEMR LAB
ALT: 23 units/L (ref 0–35)
AST: 17 units/L (ref 0–37)
Albumin: 4 g/dL (ref 3.5–5.2)
Alkaline Phosphatase: 67 units/L (ref 39–117)
Bilirubin, Direct: 0.1 mg/dL (ref 0.0–0.3)
Indirect Bilirubin: 0.1 mg/dL (ref 0.0–0.9)
Total Bilirubin: 0.2 mg/dL — ABNORMAL LOW (ref 0.3–1.2)
Total Protein: 7 g/dL (ref 6.0–8.3)

## 2009-11-28 ENCOUNTER — Ambulatory Visit: Payer: Self-pay | Admitting: Family Medicine

## 2009-11-28 DIAGNOSIS — R5381 Other malaise: Secondary | ICD-10-CM | POA: Insufficient documentation

## 2009-11-28 DIAGNOSIS — R5383 Other fatigue: Secondary | ICD-10-CM

## 2009-11-29 LAB — CONVERTED CEMR LAB
ALT: 44 units/L — ABNORMAL HIGH (ref 0–35)
AST: 30 units/L (ref 0–37)
Albumin: 4.5 g/dL (ref 3.5–5.2)
Alkaline Phosphatase: 77 units/L (ref 39–117)
BUN: 11 mg/dL (ref 6–23)
Bilirubin, Direct: 0.1 mg/dL (ref 0.0–0.3)
CO2: 23 meq/L (ref 19–32)
Calcium: 9.4 mg/dL (ref 8.4–10.5)
Chloride: 107 meq/L (ref 96–112)
Cholesterol: 154 mg/dL (ref 0–200)
Creatinine, Ser: 0.68 mg/dL (ref 0.40–1.20)
Glucose, Bld: 89 mg/dL (ref 70–99)
HDL: 55 mg/dL (ref >39)
Hgb A1c MFr Bld: 7.6 % — ABNORMAL HIGH (ref <5.7)
Indirect Bilirubin: 0.3 mg/dL (ref 0.0–0.9)
LDL Cholesterol: 86 mg/dL (ref 0–99)
Potassium: 4 meq/L (ref 3.5–5.3)
Sodium: 141 meq/L (ref 135–145)
TSH: 3.679 microintl units/mL (ref 0.350–4.500)
Total Bilirubin: 0.4 mg/dL (ref 0.3–1.2)
Total CHOL/HDL Ratio: 2.8
Total Protein: 7.5 g/dL (ref 6.0–8.3)
Triglycerides: 64 mg/dL (ref <150)
VLDL: 13 mg/dL (ref 0–40)

## 2010-01-10 ENCOUNTER — Ambulatory Visit: Payer: Self-pay | Admitting: Internal Medicine

## 2010-01-10 DIAGNOSIS — R110 Nausea: Secondary | ICD-10-CM | POA: Insufficient documentation

## 2010-01-11 ENCOUNTER — Telehealth: Payer: Self-pay | Admitting: Family Medicine

## 2010-01-12 ENCOUNTER — Encounter: Payer: Self-pay | Admitting: Gastroenterology

## 2010-01-16 ENCOUNTER — Encounter: Payer: Self-pay | Admitting: Urgent Care

## 2010-01-27 ENCOUNTER — Encounter (INDEPENDENT_AMBULATORY_CARE_PROVIDER_SITE_OTHER): Payer: Self-pay

## 2010-02-15 ENCOUNTER — Telehealth: Payer: Self-pay | Admitting: Family Medicine

## 2010-02-24 ENCOUNTER — Encounter: Payer: Self-pay | Admitting: Gastroenterology

## 2010-02-28 ENCOUNTER — Ambulatory Visit: Payer: Self-pay | Admitting: Family Medicine

## 2010-02-28 LAB — CONVERTED CEMR LAB
Bilirubin Urine: NEGATIVE
Blood in Urine, dipstick: NEGATIVE
Glucose, Urine, Semiquant: NEGATIVE
Ketones, urine, test strip: NEGATIVE
Nitrite: NEGATIVE
Protein, U semiquant: NEGATIVE
Specific Gravity, Urine: 1.025
Urobilinogen, UA: 0.2
WBC Urine, dipstick: NEGATIVE
pH: 5.5

## 2010-03-02 LAB — CONVERTED CEMR LAB: Hgb A1c MFr Bld: 7.9 % — ABNORMAL HIGH (ref <5.7)

## 2010-03-07 ENCOUNTER — Encounter (INDEPENDENT_AMBULATORY_CARE_PROVIDER_SITE_OTHER): Payer: Self-pay

## 2010-03-09 ENCOUNTER — Telehealth (INDEPENDENT_AMBULATORY_CARE_PROVIDER_SITE_OTHER): Payer: Self-pay | Admitting: *Deleted

## 2010-03-09 LAB — CONVERTED CEMR LAB
ALT: 39 units/L — ABNORMAL HIGH (ref 0–35)
AST: 30 units/L (ref 0–37)
Albumin: 4.6 g/dL (ref 3.5–5.2)
Alkaline Phosphatase: 76 units/L (ref 39–117)
Bilirubin, Direct: 0.1 mg/dL (ref 0.0–0.3)
Indirect Bilirubin: 0.2 mg/dL (ref 0.0–0.9)
Total Bilirubin: 0.3 mg/dL (ref 0.3–1.2)
Total Protein: 7.8 g/dL (ref 6.0–8.3)

## 2010-03-29 ENCOUNTER — Telehealth: Payer: Self-pay | Admitting: Family Medicine

## 2010-04-02 ENCOUNTER — Encounter: Payer: Self-pay | Admitting: Obstetrics and Gynecology

## 2010-04-02 ENCOUNTER — Encounter: Payer: Self-pay | Admitting: Family Medicine

## 2010-04-11 NOTE — Progress Notes (Signed)
Summary: WANTS NEURO APPT  Phone Note Call from Patient   Summary of Call: WHEN HERE LAST TIME DR ASKED HER DID SHE WANT HER TO CONTACT A NEUOSURGEON AND NOW SHE WANTS TO  BUT SHE WANTS TO SCHEDULE IT HER SELF BUT NEEDS A NAME OF ONE  CALL BACK AT [phone] Initial call taken by: [name],  September 26, 2009 10:24 AM  Follow-up for Phone Call        called pt and gave her [facility] number. she was going to call and see if she could get appt.  Follow-up by: Rudene Anda,  September 26, 2009 11:58 AM

## 2010-04-11 NOTE — Assessment & Plan Note (Signed)
Summary: 1 month/apc   Visit Type:  Follow-up Referred by:  M. Lodema Hong PCP:  Lodema Hong  Chief Complaint:  follow up.  History of Present Illness: Current Problems:  HYPERTENSION (ICD-401.9) DIABETES, TYPE 2 (ICD-250.00) ALLERGIC RHINITIS (ICD-477.9) ? of SLEEP APNEA (ICD-780.57)  Returns for follow-up after sleep study. ESS 16/24. AHI 12.3 with desat to 87%. Questionable bruxism noted. Her nasal congestion and watering eyes may have been just a little better recently. We discussed sleep apnea , medical concerns and availble treatments. she had cpap before but had stopped using it, partly because of her head congestion. Dislikes taste of Astepro and we discussed alternative samples. Would give astelin but we are out.       Current Allergies (reviewed today): ! PERCOCET ! PCN  Past Medical History:    Reviewed history from 04/18/2007 and no changes required:       Allergic Rhinitis       Diabetes, Type 2       Hypertension       Sleep Apnea NPSG 05/09/07 AHI 12.3, ESS 16/24       MS  Past Surgical History:    Reviewed history from 04/18/2007 and no changes required:       left rotator cuff repair       Right knee repair       C-section x 2       Hernia repair       R wrist     Review of Systems      See HPI   Vital Signs:  Patient Profile:   55 Years Old Female Weight:      212 pounds O2 Sat:      97 % O2 treatment:    Room Air Pulse rate:   97 / minute BP sitting:   122 / 80  (right arm) Cuff size:   regular  Vitals Entered By: Reynaldo Minium CMA (May 16, 2007 10:32 AM)                 Physical Exam  General:     obese.   Eyes:     periorbital edema/ fat Nose:     mild turbinate edema Mouth:     Melampatti Class III.   Neck:     no masses, thyromegaly, or abnormal cervical nodes Lungs:     clear bilaterally to auscultation and percussion Heart:     regular rate and rhythm, S1, S2 without murmurs, rubs, gallops, or clicks Cervical  Nodes:     no significant adenopathy     Impression & Recommendations:  Problem # 1:  SLEEP APNEA (ICD-780.57) Mild. Will ask [facility] to do autotitration. Orders: Est. Patient Level III ([address]) DME Referral (DME)   Problem # 2:  ALLERGIC RHINITIS (ICD-477.9) Emphasized environmental precautions.Will try sample Patanase.May need to reassess allergy vaccine option. She will ask Dr. Willa Rough to send skin test results. Her updated medication list for this problem includes:    Rhinocort Aqua 32 Mcg/act Susp (Budesonide (nasal)) ..... One spray each nostril once daily    Allegra 180 Mg Tabs (Fexofenadine hcl) ..... One tab by mouth once daily    Astepro 137 Mcg/spray Soln (Azelastine hcl) ..... Use as directed    Fexofenadine Hcl 180 Mg Tabs (Fexofenadine hcl) .Marland Kitchen... Take 1 tablet by mouth once a day  Orders: Est. Patient Level III (60454)   Medications Added to Medication List This Visit: 1)  Reglan 10 Mg Tabs (Metoclopramide hcl) .Marland KitchenMarland KitchenMarland Kitchen  Take 1 by mouth before meals and at bedtime   Patient Instructions: 1)  Please schedule a follow-up appointment in 1 month. 2)  [facility] will contact you about doing an autotitration cpap study at home 3)  Try sample Patanase nasal spray, 1 puff each nostril twice daily as needed. 4)  Please have Dr. Esperanza Richters office send results of your previous allergy testing.    ]

## 2010-04-11 NOTE — Miscellaneous (Signed)
Summary: HIPAA FORM  HIPAA FORM   Imported By: [name] 07/01/2008 10:05:02  _____________________________________________________________________  External Attachment:    Type:   Image     Comment:   External Document

## 2010-04-11 NOTE — Miscellaneous (Signed)
Summary: due labs and ov  Please note:  Patient is due for CBC and LFTs. Should have prior to OV with RMR only. Please make appt with first available with him.  Appended Document: due labs and ov unable to reach pt by phone. mailed letter with next available appt for RMR. 07/20/09 @3 :30pm

## 2010-04-11 NOTE — Letter (Signed)
Summary: TCS/EGD ORDER  TCS/EGD ORDER   Imported By: Ave Filter 11/09/2008 08:00:16  _____________________________________________________________________  External Attachment:    Type:   Image     Comment:   External Document

## 2010-04-11 NOTE — Miscellaneous (Signed)
Clinical Lists Changes  L-Hepatic Function Panel (HFP / LFT) - STATUS: Final                                            Perform Date: 17Sep10 10:00  Ordered By: Jena Gauss MD , Gerrit Friends           Ordered Date: 17Sep10 09:37                                       Last Updated Date: 17Sep10 10:38  Facility: [facility]                               Department: [facility]  Accession #: [id number]                    USN:       [id number]  Findings  Result Name                              Result     Abnl   Normal Range     Units      Perf. Loc.  Bilirubin, Total                         0.4               0.3-1.2          mg/dL  Bilirubin, Direct                        0.1               0.0-0.3          mg/dL  Indirect Bilirubin                       0.3               0.3-0.9          mg/dL  Alkaline Phosphatase                     81                39-117           U/L  SGOT (AST)                               65         h      0-37             U/L  SGPT (ALT)                               71         h      0-35             U/L  Total  Protein  7.3               6.0-8.3          g/dL  Albumin-Blood                            3.9               3.5-5.2          g/dL  Additional Information  HL7 RESULT STATUS : F  External IF Update Timestamp : 2008-11-26:10:36:00.000000

## 2010-04-11 NOTE — Progress Notes (Signed)
Summary: PERIOD  Phone Note Call from Patient   Summary of Call: period came on nov 14 AND CAME BACK ON  NOV 24 STILL SPOTTING STOMACH PAIN  PLEASE CALL BACK AT [phone] Initial call taken by: [name],  February 15, 2010 9:57 AM  Follow-up for Phone Call        pls verify that she sees gynae for her pap,with her, if so, she needs to call gynae Follow-up by: Syliva Overman MD,  February 16, 2010 1:01 PM  Additional Follow-up for Phone Call Additional follow up Details #1::        returned call, no answer Additional Follow-up by: Adella Hare LPN,  February 16, 2010 1:39 PM    Additional Follow-up for Phone Call Additional follow up Details #2::    patient aware Follow-up by: Adella Hare LPN,  February 16, 2010 2:33 PM

## 2010-04-11 NOTE — Assessment & Plan Note (Signed)
Summary: office visit   Vital Signs:  Patient profile:   55 year old female Menstrual status:  perimenopausal Height:      61.5 inches Weight:      205.25 pounds BMI:     38.29 O2 Sat:      95 % Pulse rate:   87 / minute Pulse rhythm:   regular Resp:     16 per minute BP sitting:   130 / 82  (left arm) Cuff size:   large  Vitals Entered By: Everitt Amber LPN (May 03, 2009 9:36 AM)  Nutrition Counseling: Patient's BMI is greater than 25 and therefore counseled on weight management options. CC: she was using some cleaner lastweek called awesome and she must have inhaled some because she has been coughing since lastweek   Primary Care Provider:  Syliva Overman  CC:  she was using some cleaner lastweek called awesome and she must have inhaled some because she has been coughing since lastweek.  History of Present Illness: 1 week h/o sinus pressure, head and chest congestion, fever , undocumented and chills. nasal drainage and sputum so fasr is clear. she always links her symptom trigger I again advised her to get a cleaner  as thisis a problenm  Current Medications (verified): 1)  Glipizide 10 Mg  Tb24 (Glipizide) .... One Tab By Mouth Two Times A Day 2)  Allegra 180 Mg  Tabs (Fexofenadine Hcl) .... One Tab By Mouth Once Daily 3)  Benazepril-Hydrochlorothiazide 20-12.5 Mg  Tabs (Benazepril-Hydrochlorothiazide) .... Two Tab By Mouth Once Daily 4)  Patanase 0.6 %  Soln (Olopatadine Hcl) .Marland Kitchen.. 1-2 Sprays Each Nostril Two Times A Day As Needed 5)  Cpap 8 [facility] 6)  Nifedipine 90 Mg Xr24h-Tab (Nifedipine) .... One Tab By Mouth Once Daily 7)  Astepro 137 Mcg/spray Soln (Azelastine Hcl) .... 2 Puffs Per Nostril Twice Daily 8)  Singulair 10 Mg Tabs (Montelukast Sodium) .... Take 1 Tablet By Mouth Once A Day 9)  Benadryl 25 Mg Caps (Diphenhydramine Hcl) .... As Needed 10)  Multivitamins  Tabs (Multiple Vitamin) .... Once Daily 11)  Dexilant 60 Mg Cpdr  (Dexlansoprazole) .... One By Mouth 30 Mins Before Breakfast 12)  Docusate Sodium 100 Mg Caps (Docusate Sodium) .... Take 1 Tablet By Mouth Once A Day As Needed 13)  Actoplus Met 15-850 Mg Tabs (Pioglitazone Hcl-Metformin Hcl) .... Take 1 Tablet By Mouth Two Times A Day 14)  Onetouch Ultra Test  Strp (Glucose Blood) .... Test One Time Daily. 15)  Tussionex Pennkinetic Er 8-10 Mg/90ml Lqcr (Chlorpheniramine-Hydrocodone) .... 5cc By Mouth Two Times A Day Prn 16)  Polysaccharide Iron 150 Mg Caps (Polysaccharide Iron Complex) .... One By Mouth Daily  Allergies (verified): 1)  ! Percocet 2)  ! Pcn 3)  ! Asa  Review of Systems      See HPI General:  Complains of fatigue, malaise, and weakness. ENT:  Complains of nasal congestion and sinus pressure. CV:  Denies chest pain or discomfort and palpitations. Resp:  Complains of cough and sputum productive. GI:  Denies abdominal pain, constipation, diarrhea, nausea, and vomiting. GU:  Denies dysuria and urinary frequency. MS:  Complains of joint pain and stiffness; bilatyeral. Endo:  reports testing at least 6 days per week once daily, but has lows to the 50's as ofteb as twice weekly, will need to further work on carb counting, test twice daily , definitely at bedtime tll resolved.  Physical Exam  General:  Well-developed, obese,in no acute distress;  alert,appropriate and cooperative throughout examination. HEENT: No facial asymmetry,  EOMI, No sinus tenderness, TM's Clear, oropharynx  pink and moist.   Chest: Clear to auscultation bilaterally.  CVS: S1, S2, No murmurs, No S3.   Abd: Soft, Nontender.  MS: Adequate ROM spine, , shoulders and knees.Decreased in hips  Ext: No edema.   CNS: CN 2-12 intact, power tone and sensation normal throughout.   Skin: Intact, no visible lesions or rashes.  Psych: Good eye contact, normal affect.  Memory intact, not anxious or depressed appearing.    Impression & Recommendations:  Problem # 1:  HIP PAIN,  BILATERAL (ICD-719.45) Assessment Deteriorated  Orders: Depo- Medrol 80mg  (J1040) Ketorolac-Toradol 15mg  (W1191) Admin of Therapeutic Inj  intramuscular or subcutaneous (47829)  Problem # 2:  ACUTE BRONCHITIS (ICD-466.0) Assessment: Comment Only  The following medications were removed from the medication list:    Doxycycline Hyclate 100 Mg Caps (Doxycycline hyclate) .Marland Kitchen... Take 1 tablet by mouth two times a day Her updated medication list for this problem includes:    Singulair 10 Mg Tabs (Montelukast sodium) .Marland Kitchen... Take 1 tablet by mouth once a day    Tussionex Pennkinetic Er 8-10 Mg/76ml Lqcr (Chlorpheniramine-hydrocodone) .Marland Kitchen... 5cc by mouth two times a day prn    Erythromycin Stearate 500 Mg Tabs (Erythromycin stearate) .Marland Kitchen... Take 1 tablet by mouth three times a day    Tessalon Perles 100 Mg Caps (Benzonatate) .Marland Kitchen... Take 1 capsule by mouth three times a day  Take antibiotics and other medications as directed. Encouraged to push clear liquids, get enough rest, and take acetaminophen as needed. To be seen in 5-7 days if no improvement, sooner if worse.  Problem # 3:  OBESITY (ICD-278.00) Assessment: Unchanged  Ht: 61.5 (05/03/2009)   Wt: 205.25 (05/03/2009)   BMI: 38.29 (05/03/2009)  Problem # 4:  HYPERTENSION (ICD-401.9) Assessment: Improved  Her updated medication list for this problem includes:    Benazepril-hydrochlorothiazide 20-12.5 Mg Tabs (Benazepril-hydrochlorothiazide) .Marland Kitchen..Marland Kitchen Two tab by mouth once daily    Nifedipine 90 Mg Xr24h-tab (Nifedipine) ..... One tab by mouth once daily  BP today: 130/82 Prior BP: 154/98 (03/02/2009)  Labs Reviewed: K+: 3.9 (01/10/2009) Creat: : 0.70 (01/10/2009)   Chol: 171 (01/10/2009)   HDL: 57 (01/10/2009)   LDL: 97 (01/10/2009)   TG: 83 (01/10/2009)  Problem # 5:  DIABETES, TYPE 2 (ICD-250.00) Assessment: Comment Only  The following medications were removed from the medication list:    Actoplus Met 15-850 Mg Tabs (Pioglitazone  hcl-metformin hcl) .Marland Kitchen... Take 1 tablet by mouth two times a day Her updated medication list for this problem includes:    Glipizide 10 Mg Tb24 (Glipizide) ..... One tab by mouth two times a day    Benazepril-hydrochlorothiazide 20-12.5 Mg Tabs (Benazepril-hydrochlorothiazide) .Marland Kitchen..Marland Kitchen Two tab by mouth once daily    Janumet 50-1000 Mg Tabs (Sitagliptin-metformin hcl) .Marland Kitchen... Take 1 tablet by mouth two times a day  Orders: Glucose, (CBG) (82962) T- Hemoglobin A1C [phone])  Labs Reviewed: Creat: 0.70 (01/10/2009)    Reviewed HgBA1c results: 8.0 (01/12/2009)  7.8 (10/05/2008)  Complete Medication List: 1)  Glipizide 10 Mg Tb24 (Glipizide) .... One tab by mouth two times a day 2)  Allegra 180 Mg Tabs (Fexofenadine hcl) .... One tab by mouth once daily 3)  Benazepril-hydrochlorothiazide 20-12.5 Mg Tabs (Benazepril-hydrochlorothiazide) .... Two tab by mouth once daily 4)  Patanase 0.6 % Soln (Olopatadine hcl) .Marland Kitchen.. 1-2 sprays each nostril two times a day as needed 5)  Cpap 8 Cwp [facility]  6)  Nifedipine 90 Mg Xr24h-tab (Nifedipine) .... One tab by mouth once daily 7)  Astepro 137 Mcg/spray Soln (Azelastine hcl) .... 2 puffs per nostril twice daily 8)  Singulair 10 Mg Tabs (Montelukast sodium) .... Take 1 tablet by mouth once a day 9)  Benadryl 25 Mg Caps (Diphenhydramine hcl) .... As needed 10)  Multivitamins Tabs (Multiple vitamin) .... Once daily 11)  Dexilant 60 Mg Cpdr (Dexlansoprazole) .... One by mouth 30 mins before breakfast 12)  Docusate Sodium 100 Mg Caps (Docusate sodium) .... Take 1 tablet by mouth once a day as needed 13)  Onetouch Ultra Test Strp (Glucose blood) .... Test one time daily. 14)  Tussionex Pennkinetic Er 8-10 Mg/76ml Lqcr (Chlorpheniramine-hydrocodone) .... 5cc by mouth two times a day prn 15)  Polysaccharide Iron 150 Mg Caps (Polysaccharide iron complex) .... One by mouth daily 16)  Erythromycin Stearate 500 Mg Tabs (Erythromycin stearate) .... Take 1  tablet by mouth three times a day 17)  Tessalon Perles 100 Mg Caps (Benzonatate) .... Take 1 capsule by mouth three times a day 18)  Janumet 50-1000 Mg Tabs (Sitagliptin-metformin hcl) .... Take 1 tablet by mouth two times a day 19)  Lancets and Strips  .... Two times a day testing for relion meter  Patient Instructions: 1)  Please schedule a follow-up appointment in 3 months. 2)  It is important that you exercise regularly at least 40 minutes 5 times a week. If you develop chest pain, have severe difficulty breathing, or feel very tired , stop exercising immediately and seek medical attention. 3)  You need to lose weight. Consider a lower calorie diet and regular exercise.  4)  HbgA1C prior to visit, ICD-9:  today  5)  pls start tesrting twice daily your blood sugars 6)  cal;l neurologist for result in 3 days if you do not hear from him pl;s 7)  pls stop actoplusmet on completion of current bottle new med to be started 8)  you are being treated for sinusitis and bronchitis, pls take all meds 9)  You will get injections today for your hip  Prescriptions: LANCETS AND STRIPS two times a day testing for Relion meter  #60 x 5   Entered and Authorized by:   Syliva Overman MD   Signed by:   Syliva Overman MD on 05/06/2009   Method used:   Handwritten   RxID:   [id number] JANUMET 50-1000 MG TABS (SITAGLIPTIN-METFORMIN HCL) Take 1 tablet by mouth two times a day  #60 x 3   Entered and Authorized by:   Syliva Overman MD   Signed by:   Syliva Overman MD on 05/03/2009   Method used:   Printed then faxed to ...       [facility]  [address] [phone]* (retail)       [address]       [place], [place]  [address]       Ph: [phone] or [phone]       Fax: [phone]   RxID:   [id number] TESSALON PERLES 100 MG CAPS (BENZONATATE) Take 1 capsule by mouth three times a day  #30 x 0   Entered and Authorized by:   Syliva Overman MD   Signed by:   Syliva Overman MD on  05/03/2009   Method used:   Electronically to        [facility] [phone]* (retail)       [address]  [place], [place]  [address]       Ph: [phone] or [phone]       Fax: [phone]   RxID:   [id number] ERYTHROMYCIN STEARATE 500 MG TABS (ERYTHROMYCIN STEARATE) Take 1 tablet by mouth three times a day  #30 x 0   Entered and Authorized by:   Syliva Overman MD   Signed by:   Syliva Overman MD on 05/03/2009   Method used:   [facility]ronically to        [facility] [phone]* (retail)       [address]       [place], [place]  [address]       Ph: [phone] or [phone]       Fax: [phone]   RxID:   [id number]   Laboratory Results   Blood Tests     Glucose (random): 122 mg/dL   (Normal Range: 38-453)      Medication Administration  Injection # 1:    Medication: Depo- Medrol 80mg     Diagnosis: HIP PAIN, BILATERAL (ICD-719.45)    Route: IM    Site: RUOQ gluteus    Exp Date: 01/2010    Lot #: [id number]    Mfr: [employer]    Comments: 80mg  given     Patient tolerated injection without complications    Given by: Everitt Amber LPN (May 03, 2009 10:35 AM)  Injection # 2:    Medication: Ketorolac-Toradol 15mg     Diagnosis: HIP PAIN, BILATERAL (ICD-719.45)    Route: IM    Site: LUOQ gluteus    Exp Date: 10/2010    Lot #: [id number]    Mfr: [email or web address]    Comments: 60mg  given     Patient tolerated injection without complications    Given by: Everitt Amber LPN (May 03, 2009 10:36 AM)  Orders Added: 1)  Glucose, (CBG) [82962] 2)  Est. Patient Level IV [64680] 3)  T- Hemoglobin A1C [83036-23375] 4)  Depo- Medrol 80mg  [J1040] 5)  Ketorolac-Toradol 15mg  [J1885] 6)  Admin of Therapeutic Inj  intramuscular or subcutaneous [32122]

## 2010-04-11 NOTE — Letter (Signed)
Summary: Appointment Reminder  [facility]  [address]   [place], [place] [address]   Phone: [phone]  Fax: [phone]       May 06, 2009   KATHRENE SINOPOLI [address] [place], [place]  [address] 10/30/1955    Dear Ms. Carmical,  We have been unable to reach you by phone to schedule a follow up   appointment that was recommended for you by Dr. Jena Gauss. It is very   important that we reach you to schedule an appointment. We hope that you  allow [place] to participate in your health care needs. Please contact us at  [phone] at your earliest convenience to schedule your appointment.  Sincerely,    [facility] R. Roetta Sessions, M.D.    Kassie Mends, M.D. Lorenza Burton, FNP-BC    Tana Coast, PA-C Phone: [phone]    Fax: [phone]

## 2010-04-11 NOTE — Assessment & Plan Note (Signed)
Summary: ov   Vital Signs:  Patient profile:   55 year old female Menstrual status:  perimenopausal Height:      61.5 inches Weight:      211.13 pounds BMI:     39.39 Pulse rate:   80 / minute Pulse rhythm:   regular Resp:     16 per minute BP sitting:   150 / 90  (left arm)  Vitals Entered By: Worthy Keeler LPN (September 07, 2008 11:22 AM)  Nutrition Counseling: Patient's BMI is greater than 25 and therefore counseled on weight management options. CC: follow-up visit Is Patient Diabetic? Yes  Pain Assessment Patient in pain? no        Primary Care Provider:  Lodema Hong  CC:  follow-up visit.  History of Present Illness: pt recently lost her ailing mother and is recovering from this slowly. Though she is happy that her mom is no longer suffering , she misses her, which I advise is natural. She recently had her bP checked at [facility], and was told it was very high , so she made this appt. She does admit to some sleep disturbance and inc stress since her mother's passing and feels as though this is the root of her problem. She has not been testing her blood sugars as she should, but denies symptoms of uncontrolled blood sugars. She denies head or chest congestion.   Current Medications (verified): 1)  Glipizide 10 Mg  Tb24 (Glipizide) .... One Tab By Mouth Two Times A Day 2)  Allegra 180 Mg  Tabs (Fexofenadine Hcl) .... One Tab By Mouth Once Daily 3)  Benazepril-Hydrochlorothiazide 20-12.5 Mg  Tabs (Benazepril-Hydrochlorothiazide) .... Two Tab By Mouth Once Daily 4)  Zegerid 40-1100 Mg  Caps (Omeprazole-Sodium Bicarbonate) .... Take 1 Tablet By Mouth Once A Day 5)  Patanase 0.6 %  Soln (Olopatadine Hcl) .Marland Kitchen.. 1-2 Sprays Each Nostril Two Times A Day As Needed 6)  Cpap 8 [facility] 7)  Nifedipine 90 Mg Xr24h-Tab (Nifedipine) .... One Tab By Mouth Once Daily 8)  Astepro 137 Mcg/spray Soln (Azelastine Hcl) .... 2 Puffs Per Nostril Twice Daily 9)  Singulair 10 Mg Tabs  (Montelukast Sodium) .... Take 1 Tablet By Mouth Once A Day 10)  Janumet 50-1000 Mg Tabs (Sitagliptin-Metformin Hcl) .... Take 1 Tablet By Mouth Two Times A Day  Allergies (verified): 1)  ! Percocet 2)  ! Pcn  Past History:  Past medical, surgical, family and social histories (including risk factors) reviewed, and no changes noted (except as noted below).  Past Medical History: Reviewed history from 05/16/2007 and no changes required. Allergic Rhinitis Diabetes, Type 2 Hypertension Sleep Apnea NPSG 05/09/07 AHI 12.3, ESS 16/24 MS  Past Surgical History: Reviewed history from 04/18/2007 and no changes required. left rotator cuff repair Right knee repair C-section x 2 Hernia repair R wrist  Family History: Reviewed history from 06/16/2007 and no changes required. allergies Asthma Aunt with cancer Mother deceasd in 2010, ventricular fibbrilation with cardiac arrest  Social History: Reviewed history from 04/18/2007 and no changes required. Patient never smoked.  Positive history of passive tobacco smoke exposure- lives with smokers.  Married, adult children, all healthy. unemployed.  Review of Systems General:  See HPI; Complains of fatigue and sleep disorder; denies chills and fever. ENT:  See HPI; Denies hoarseness, nasal congestion, sinus pressure, and sore throat. CV:  Denies chest pain or discomfort, palpitations, and swelling of feet. Resp:  See HPI; Denies cough and sputum productive. GI:  Denies abdominal  pain, constipation, diarrhea, nausea, and vomiting. GU:  Denies dysuria and urinary frequency. MS:  Denies joint pain, low back pain, mid back pain, and stiffness. Derm:  Denies itching, lesion(s), and rash. Psych:  See HPI; Complains of anxiety, depression, and easily tearful; mild due to mourning recent loss of her mom. Endo:  Denies cold intolerance, excessive hunger, excessive thirst, excessive urination, heat intolerance, polyuria, and weight  change.  Physical Exam  General:  alert, well-hydrated, and overweight-appearing.  HEENT: No facial asymmetry,  EOMI, No sinus tenderness, TM's Clear, oropharynx  pink and moist.   Chest: Clear to auscultation bilaterally.  CVS: S1, S2, No murmurs, No S3.   Abd: Soft, Nontender.  MS: Adequate ROM spine, hips, shoulders and knees.  Ext: No edema.   CNS: CN 2-12 intact, power tone and sensation normal throughout.   Skin: Intact, no visible lesions or rashes.  Psych: Good eye contact, normal affect.  Memory intact,tearful and mildly depressed appearing   Impression & Recommendations:  Problem # 1:  MORBID OBESITY (ICD-278.01) Assessment Improved  Ht: 61.5 (09/07/2008)   Wt: 211.13 (09/07/2008)   BMI: 39.39 (09/07/2008)  Problem # 2:  HYPERTENSION (ICD-401.9) Assessment: Deteriorated  Her updated medication list for this problem includes:    Benazepril-hydrochlorothiazide 20-12.5 Mg Tabs (Benazepril-hydrochlorothiazide) .Marland Kitchen..Marland Kitchen Two tab by mouth once daily    Nifedipine 90 Mg Xr24h-tab (Nifedipine) ..... One tab by mouth once daily  BP today: 150/90 Prior BP: 120/80 (07/01/2008), no change in meds, I think this is related to her current psychosocial situation  Labs Reviewed: K+: 4.0 (07/01/2008) Creat: : 0.66 (07/01/2008)   Chol: 171 (07/01/2008)   HDL: 60 (07/01/2008)   LDL: 97 (07/01/2008)   TG: 70 (07/01/2008)  Problem # 3:  DIABETES, TYPE 2 (ICD-250.00) Assessment: Unchanged  Her updated medication list for this problem includes:    Glipizide 10 Mg Tb24 (Glipizide) ..... One tab by mouth two times a day    Benazepril-hydrochlorothiazide 20-12.5 Mg Tabs (Benazepril-hydrochlorothiazide) .Marland Kitchen..Marland Kitchen Two tab by mouth once daily    Janumet 50-1000 Mg Tabs (Sitagliptin-metformin hcl) .Marland Kitchen... Take 1 tablet by mouth two times a day  Orders: Glucose, (CBG) [phone])  Labs Reviewed: Creat: 0.66 (07/01/2008)    Reviewed HgBA1c results: 7.0 (07/01/2008)  6.7 (03/31/2008)  Problem # 4:   MULTIPLE SCLEROSIS (ICD-340) Assessment: Unchanged pt non compliant with neurology f/u and needs to change this  Problem # 5:  GRIEF REACTION, ACUTE (ICD-309.0) Assessment: Comment Only pt encouraged to verbalise her feelings, and i made her aware that how she was feeling was normal, no therapy indicated or requested at this time  Complete Medication List: 1)  Glipizide 10 Mg Tb24 (Glipizide) .... One tab by mouth two times a day 2)  Allegra 180 Mg Tabs (Fexofenadine hcl) .... One tab by mouth once daily 3)  Benazepril-hydrochlorothiazide 20-12.5 Mg Tabs (Benazepril-hydrochlorothiazide) .... Two tab by mouth once daily 4)  Zegerid 40-1100 Mg Caps (Omeprazole-sodium bicarbonate) .... Take 1 tablet by mouth once a day 5)  Patanase 0.6 % Soln (Olopatadine hcl) .Marland Kitchen.. 1-2 sprays each nostril two times a day as needed 6)  Cpap 8 [facility]  7)  Nifedipine 90 Mg Xr24h-tab (Nifedipine) .... One tab by mouth once daily 8)  Astepro 137 Mcg/spray Soln (Azelastine hcl) .... 2 puffs per nostril twice daily 9)  Singulair 10 Mg Tabs (Montelukast sodium) .... Take 1 tablet by mouth once a day 10)  Janumet 50-1000 Mg Tabs (Sitagliptin-metformin hcl) .... Take 1 tablet by  mouth two times a day  Patient Instructions: 1)  F/U in 3 weeks, July 22 please cancel any later ijn July. 2)  It is important that you exercise regularly at least 20 minutes 5 times a week. If you develop chest pain, have severe difficulty breathing, or feel very tired , stop exercising immediately and seek medical attention. 3)  You need to lose weight. Consider a lower calorie diet and regular exercise.  4)  YOUR BP today is 152/90. IT is VITAL that you take your meds regularly and at the same time as they are prescribed.  Laboratory Results   Blood Tests   Date/Time Received: 09/07/08 Date/Time Reported: 09/07/08  Glucose (random): 129 mg/dL   (Normal Range: 16-109)

## 2010-04-11 NOTE — Letter (Signed)
Summary: Recall, Labs Needed  [facility]  [address]   [place], [place] [address]   Phone: [phone]  Fax: [phone]    January 26, 2009  ZANYLA KLEBBA [address] [place], [place]  [address] Jun 23, 1955   Dear Ms. Tonelli,   Our records indicate it is time to repeat your blood work.  You can take the enclosed form to the lab on or near the date indicated.  Please make note of the new location of the lab:   [address], [address]nd floor   [facility]  Our office will call you within a week to ten business days with the results.  If you do not hear from [place] in 10 business days, you should call the office.  If you have any questions regarding this, call the office at [phone], and ask for the nurse.  Labs are due on 03/08/09.   Sincerely,    Hendricks Limes LPN  [facility] Ph: [phone]   Fax: [phone]

## 2010-04-11 NOTE — Letter (Signed)
Summary: HANDICAP CARD  HANDICAP CARD   Imported By: [name] 11/28/2009 14:04:23  _____________________________________________________________________  External Attachment:    Type:   Image     Comment:   External Document

## 2010-04-11 NOTE — Medication Information (Signed)
Summary: Tax adviser   Imported By: [name] 03/08/2009 11:41:49  _____________________________________________________________________  External Attachment:    Type:   Image     Comment:   External Document

## 2010-04-11 NOTE — Progress Notes (Signed)
  Phone Note Call from Patient   Summary of Call: Yesterday she felt like she had sand in her eyes and this morning she woke up with her eyes matted shut with green discharge and when she looked at light everything was foggy. Advised that it sounded like an eye infection and she may need to go to urgent care. She stated she did not like urgent care and asked if she could see her eye doctor and I told her that would be fine. And to call back if she needed anything Initial call taken by: Everitt Amber,  June 21, 2008 8:12 AM

## 2010-04-11 NOTE — Assessment & Plan Note (Signed)
Summary: F UP   Vital Signs:  Patient Profile:   55 Years Old Female Height:     61 inches (154.94 cm) Weight:      209.31 pounds (95.14 kg) BMI:     39.69 BSA:     1.93 O2 Sat:      96 % Pulse rate:   71 / minute Resp:     14 per minute BP sitting:   140 / 86  (right arm)  Pt. in pain?   yes    Location:   shoulder    Intensity:   5    Type:       aching  Vitals Entered ByMarland Kitchen Everitt Amber (February 02, 2008 9:54 AM)                  Referred by:  Judie Petit. Lodema Hong PCP:  Lodema Hong  Chief Complaint:  Follow up and some joint pain and sinus pressure.  History of Present Illness: Right maxillary pressure for 1 week, inc postnasal drainage, chills no fever, using all allergy meds but symptoms are worsening. Cough started  last night and the cough is dry. Pt. has been having increased L shoulder and R knee pain and stiffness for the pst 1 week. Blood sugars are much improved  on actoplusmet, pt testing dauly had 1 hypoglycemic episode to 51 otherwise fasting blood sugars avg less than 130. Weight gain is a problem at this time however. Numbness in left buttock, cannot lie on the side and she experiences numbness down the left leg. Tender swelling on R foot by 5th toe for 1 month. The pt denies depression, anxiety or insomnia. a    Updated Prior Medication List: RHINOCORT AQUA 32 MCG/ACT  SUSP (BUDESONIDE (NASAL)) one spray each nostril once daily GLIPIZIDE 10 MG  TB24 (GLIPIZIDE) one tab by mouth two times a day ALLEGRA 180 MG  TABS (FEXOFENADINE HCL) one tab by mouth once daily BENAZEPRIL-HYDROCHLOROTHIAZIDE 20-12.5 MG  TABS (BENAZEPRIL-HYDROCHLOROTHIAZIDE) one tab by mouth once daily ZEGERID 40-1100 MG  CAPS (OMEPRAZOLE-SODIUM BICARBONATE) Take 1 tablet by mouth once a day PATANASE 0.6 %  SOLN (OLOPATADINE HCL) 1-2 sprays each nostril two times a day as needed * CPAP 8 [facility]  NIFEDIPINE 90 MG XR24H-TAB (NIFEDIPINE) one tab by mouth once daily COLON CLEANSER  CAPS  (MISC NATURAL PRODUCTS) One tab as needed ACTOPLUS MET 15-850 MG TABS (PIOGLITAZONE HCL-METFORMIN HCL) one tab by mouth bid  Current Allergies: ! PERCOCET ! PCN     Review of Systems  General      Complains of chills.      Denies fever and weight loss.  ENT      Complains of earache, postnasal drainage, sinus pressure, and sore throat.      Denies hoarseness.  CV      Denies chest pain or discomfort, palpitations, and swelling of feet.  Resp      Complains of cough.      Denies sputum productive and wheezing.  GI      Denies abdominal pain, constipation, diarrhea, nausea, and vomiting.  GU      Denies dysuria and urinary frequency.  MS      See HPI      Complains of joint pain and stiffness.  Endo      Complains of excessive thirst.      Denies cold intolerance, excessive hunger, excessive urination, heat intolerance, polyuria, and weight change.   Physical Exam  General:  obese pt in no C/P distress. Head:     normocephalic. positive R maxillary tenderness  Eyes:     vision grossly intact.   Ears:     External ear exam shows no significant lesions or deformities.  Otoscopic examination reveals clear canals, tympanic membranes are intact bilaterally without bulging, retraction, inflammation or discharge. Hearing is grossly normal bilaterally. Nose:     no external deformity.positiveerythema and edema of nasal mucosa   Mouth:     Oral mucosa and oropharynx without lesions or exudates.  Teeth in good repair. Neck:     No deformities, masses, or tenderness noted. Lungs:     Normal respiratory effort, chest expands symmetrically. Lungs are clear to auscultation, no crackles or wheezes. Heart:     Normal rate and regular rhythm. S1 and S2 normal without gallop, murmur, click, rub or other extra sounds. Abdomen:     soft and non-tender.   Msk:     decreased ROM lshoulder and R knee Extremities:     no edema Neurologic:     alert & oriented X3, cranial  nerves II-XII intact, and strength normal in all extremities.    Diabetes Management Exam:    Foot Exam (with socks and/or shoes not present):       Sensory-Pinprick/Light touch:          Left medial foot (L-4): diminished          Left dorsal foot (L-5): diminished          Left lateral foot (S-1): diminished          Right medial foot (L-4): diminished          Right dorsal foot (L-5): diminished          Right lateral foot (S-1): diminished       Inspection:          Left foot: normal          Right foot: normal       Nails:          Left foot: normal          Right foot: normal    Eye Exam:       Eye Exam done elsewhere    Impression & Recommendations:  Problem # 1:  SHOULDER PAIN, LEFT (ICD-719.41) Assessment: Deteriorated  Her updated medication list for this problem includes:    Ibu 800 Mg Tabs (Ibuprofen) .Marland Kitchen... Take 1 tablet by mouth two times a day as needed  Orders: Ketorolac-Toradol 15mg  (Z6109) Admin of Therapeutic Inj  intramuscular or subcutaneous ([address])   Problem # 2:  MORBID OBESITY (ICD-278.01) Assessment: Deteriorated i explained that actosmay be contributing to this andit is imptthat shereduce calories and incactivity to facilitate weight loss  Problem # 3:  HYPERTENSION (ICD-401.9) Assessment: Improved  Her updated medication list for this problem includes:    Benazepril-hydrochlorothiazide 20-12.5 Mg Tabs (Benazepril-hydrochlorothiazide) .Marland Kitchen..Marland Kitchen Two tab by mouth once daily    Nifedipine 90 Mg Xr24h-tab (Nifedipine) ..... One tab by mouth once daily Pt. has been using decongestants with her symptoms Orders: T-Hepatic Function (09811-91478)  BP today: 140/86 Prior BP: 160/86 (12/19/2007)  Labs Reviewed: Creat: 0.74 (12/29/2007) Chol: 156 (12/30/2007)   HDL: 57 (12/30/2007)   LDL: 89 (12/30/2007)   TG: 49 (12/30/2007)   Problem # 4:  DIABETES, TYPE 2 (ICD-250.00) Assessment: Improved  The following medications were removed from the  medication list:    Actoplus Met 15-850 Mg Tabs (Pioglitazone hcl-metformin hcl) .Marland KitchenMarland KitchenMarland KitchenMarland Kitchen  One tab by mouth bid  Her updated medication list for this problem includes:    Glipizide 10 Mg Tb24 (Glipizide) ..... One tab by mouth two times a day    Benazepril-hydrochlorothiazide 20-12.5 Mg Tabs (Benazepril-hydrochlorothiazide) .Marland Kitchen..Marland Kitchen Two tab by mouth once daily    Actoplus Met 15-850 Mg Tabs (Pioglitazone hcl-metformin hcl) ..... One tab by mouth bid  Labs Reviewed: HgBA1c: 8.8 (12/19/2007)   Creat: 0.74 (12/29/2007)   Microalbumin: 3.10 (06/27/2007)   Problem # 5:  ALLERGIC RHINITIS (ICD-477.9) Assessment: Deteriorated  Her updated medication list for this problem includes:    Rhinocort Aqua 32 Mcg/act Susp (Budesonide (nasal)) ..... One spray each nostril once daily    Allegra 180 Mg Tabs (Fexofenadine hcl) ..... One tab by mouth once daily    Patanase 0.6 % Soln (Olopatadine hcl) .Marland Kitchen... 1-2 sprays each nostril two times a day as needed    Astepro 137 Mcg/spray Soln (Azelastine hcl) .Marland Kitchen... 2 puffs per nostril twice daily   Problem # 6:  ACUTE MAXILLARY SINUSITIS (ICD-461.0) Assessment: Comment Only  Her updated medication list for this problem includes:    Rhinocort Aqua 32 Mcg/act Susp (Budesonide (nasal)) ..... One spray each nostril once daily    Patanase 0.6 % Soln (Olopatadine hcl) .Marland Kitchen... 1-2 sprays each nostril two times a day as needed    Septra Ds 800-160 Mg Tabs (Sulfamethoxazole-trimethoprim) .Marland Kitchen... Take 1 tablet by mouth two times a day    Astepro 137 Mcg/spray Soln (Azelastine hcl) .Marland Kitchen... 2 puffs per nostril twice daily   Complete Medication List: 1)  Rhinocort Aqua 32 Mcg/act Susp (Budesonide (nasal)) .... One spray each nostril once daily 2)  Glipizide 10 Mg Tb24 (Glipizide) .... One tab by mouth two times a day 3)  Allegra 180 Mg Tabs (Fexofenadine hcl) .... One tab by mouth once daily 4)  Benazepril-hydrochlorothiazide 20-12.5 Mg Tabs (Benazepril-hydrochlorothiazide) .... Two  tab by mouth once daily 5)  Zegerid 40-1100 Mg Caps (Omeprazole-sodium bicarbonate) .... Take 1 tablet by mouth once a day 6)  Patanase 0.6 % Soln (Olopatadine hcl) .Marland Kitchen.. 1-2 sprays each nostril two times a day as needed 7)  Cpap 8 [facility]  8)  Nifedipine 90 Mg Xr24h-tab (Nifedipine) .... One tab by mouth once daily 9)  Colon Cleanser Caps (Misc natural products) .... One tab as needed 10)  Actoplus Met 15-850 Mg Tabs (Pioglitazone hcl-metformin hcl) .... One tab by mouth bid 11)  Septra Ds 800-160 Mg Tabs (Sulfamethoxazole-trimethoprim) .... Take 1 tablet by mouth two times a day 12)  Ibu 800 Mg Tabs (Ibuprofen) .... Take 1 tablet by mouth two times a day as needed 13)  Astepro 137 Mcg/spray Soln (Azelastine hcl) .... 2 puffs per nostril twice daily   Patient Instructions: 1)  F/U in med January. 2)  Hepatic panel today. 3)  It is important that you exercise regularly at least 20 minutes 5 times a week. If you develop chest pain, have severe difficulty breathing, or feel very tired , stop exercising immediately and seek medical attention. 4)  You need to lose weight. Consider a lower calorie diet and regular exercise.  5)  Check your blood sugars regularly. If your readings are usually above : or below 70 you should contact our office. 6)  You will get injection today for your joint pains Toradol. 7)  Medication is sent in for arthritis and sinusitis.   Prescriptions: NIFEDIPINE 90 MG XR24H-TAB (NIFEDIPINE) one tab by mouth once daily  #90 x 5  Entered by:   Everitt Amber   Authorized by:   Syliva Overman MD   Signed by:   Everitt Amber on 02/02/2008   Method used:   Electronically to        [facility] [address] [phone]* (retail)       [address]       [place], [place]  [address]       Ph: [phone] or [phone]       Fax: [phone]   RxID:   [id number] ALLEGRA 180 MG  TABS (FEXOFENADINE HCL) one tab by mouth once daily  #30 x 5   Entered  by:   Everitt Amber   Authorized by:   Syliva Overman MD   Signed by:   Everitt Amber on 02/02/2008   Method used:   [facility]        [facility] [address] [phone]* (retail)       [address]       [place], [place]  [address]       Ph: [phone] or [phone]       Fax: [phone]   RxID:   [id number] GLIPIZIDE 10 MG  TB24 (GLIPIZIDE) one tab by mouth two times a day  #60 Tablet x 5   Entered by:   Everitt Amber   Authorized by:   Syliva Overman MD   Signed by:   Everitt Amber on 02/02/2008   Method used:   [facility]        [facility] [address] [phone]* (retail)       [address]       [place], [place]  [address]       Ph: [phone] or [phone]       Fax: [phone]   RxID:   [id number] BENAZEPRIL-HYDROCHLOROTHIAZIDE 20-12.5 MG  TABS (BENAZEPRIL-HYDROCHLOROTHIAZIDE) two tab by mouth once daily  #60 x 5   Entered and Authorized by:   Syliva Overman MD   Signed by:   Syliva Overman MD on 02/02/2008   Method used:   [facility]ronically to        [facility] [phone]* (retail)       [address]       [place], [place]  [address]       Ph: [phone] or [phone]       Fax: [phone]   RxID:   [id number] ASTEPRO 137 MCG/SPRAY SOLN (AZELASTINE HCL) 2 puffs per nostril twice daily  #1 x 3   Entered and Authorized by:   Syliva Overman MD   Signed by:   Syliva Overman MD on 02/02/2008   Method used:   Electronically to        [facility] [phone]* (retail)       [address]       [place], [place]  [address]       Ph: [phone] or [phone]       Fax: [phone]   RxID:   [id number] IBU 800 MG TABS (IBUPROFEN) Take 1 tablet by mouth two times a day as needed  #60 x 2   Entered and Authorized by:   Syliva Overman MD   Signed by:   Syliva Overman MD on 02/02/2008   Method used:  Electronically to        [facility] [phone]* (retail)       [address]       [place], [place]  [address]       Ph: [phone] or [phone]       Fax: [phone]   RxID:   [id number] SEPTRA DS 800-160 MG TABS (SULFAMETHOXAZOLE-TRIMETHOPRIM) Take 1 tablet by mouth two times a day  #28 x 0   Entered and Authorized by:   Syliva Overman MD   Signed by:   Syliva Overman MD on 02/02/2008   Method used:   Electronically to        [facility] [phone]* (retail)       [address]       [place], [place]  [address]       Ph: [phone] or [phone]       Fax: [phone]   RxID:   [id number]  ]  Medication Administration  Injection # 1:    Medication: Ketorolac-Toradol 15mg     Diagnosis: SHOULDER PAIN, LEFT (ICD-719.41)    Route: IM    Site: LUOQ gluteus    Exp Date: 05/10/2009    Lot #: [id number]    Mfr: Novaplus    Comments: 60 mg given    Patient tolerated injection without complications    Given by: Everitt Amber (February 02, 2008 10:52 AM)  Orders Added: 1)  Est. Patient Level IV [42706] 2)  T-Hepatic Function [23762-83151] 3)  Ketorolac-Toradol 15mg  [J1885] 4)  Admin of Therapeutic Inj  intramuscular or subcutaneous [76160]

## 2010-04-11 NOTE — Assessment & Plan Note (Signed)
Summary: office visit   Vital Signs:  Patient Profile:   55 Years Old Female Height:     61.5 inches (156.21 cm) Weight:      208.13 pounds (94.60 kg) BMI:     38.83 Pulse rate:   90 / minute Pulse rhythm:   regular Resp:     16 per minute BP sitting:   110 / 64  (left arm)  Pt. in pain?   no  Vitals Entered By: Worthy Keeler LPN (March 31, 2008 10:19 AM)                  Referred by:  Judie Petit. Lodema Hong PCP:  Lodema Hong  Chief Complaint:  follow up chronic problems.  History of Present Illness: Pt . has been doing much better with her blood sugars and is compliant with meds with excellent results.She denies fatigue, blurred visiob, polyuria, polydypsia or blurred vision.  She has started a dry cough with kerosene exposure, she used codeine cough syrup with some relief, but still is not taking her allergy meds as prescribed.  C/O bilat thigh pain while on cycles since past year, ibuprofen does not provide relief .she has an upcoming appt with gynae for her annual exam, and will have these concerns addressed at that visit also.  She denies depression, anxiety or insomnia.    Updated Prior Medication List: RHINOCORT AQUA 32 MCG/ACT  SUSP (BUDESONIDE (NASAL)) one spray each nostril once daily GLIPIZIDE 10 MG  TB24 (GLIPIZIDE) one tab by mouth two times a day ALLEGRA 180 MG  TABS (FEXOFENADINE HCL) one tab by mouth once daily BENAZEPRIL-HYDROCHLOROTHIAZIDE 20-12.5 MG  TABS (BENAZEPRIL-HYDROCHLOROTHIAZIDE) two tab by mouth once daily ZEGERID 40-1100 MG  CAPS (OMEPRAZOLE-SODIUM BICARBONATE) Take 1 tablet by mouth once a day PATANASE 0.6 %  SOLN (OLOPATADINE HCL) 1-2 sprays each nostril two times a day as needed * CPAP 8 [facility]  NIFEDIPINE 90 MG XR24H-TAB (NIFEDIPINE) one tab by mouth once daily ACTOPLUS MET 15-850 MG TABS (PIOGLITAZONE HCL-METFORMIN HCL) one tab by mouth bid IBU 800 MG TABS (IBUPROFEN) Take 1 tablet by mouth two times a day as needed ASTEPRO 137  MCG/SPRAY SOLN (AZELASTINE HCL) 2 puffs per nostril twice daily  Current Allergies: ! PERCOCET ! PCN     Review of Systems  General      See HPI  ENT      Denies earache, hoarseness, sinus pressure, and sore throat.  CV      Denies chest pain or discomfort, palpitations, shortness of breath with exertion, and swelling of feet.  Resp      Denies cough, sputum productive, and wheezing.  GI      Denies abdominal pain, constipation, diarrhea, nausea, and vomiting.  GU      Denies dysuria and urinary frequency.  MS      Complains of joint pain and stiffness.  Derm      Denies itching, lesion(s), and rash.  Psych      See HPI  Endo      Denies cold intolerance, excessive hunger, excessive thirst, excessive urination, heat intolerance, polyuria, and weight change.   Physical Exam  General:     Alert and oriented x3; no cardiopulmonary distress; HEENT; no sinus tenderness, TM clear, EOMI, oropharynx pink and moist. neck supple with no adenopathy; MS: Adequate ROM spine, hips, shoulders and knees;  Psych: Good eye contact, normal affect, memory intact, not anxious or depressed appearing; CNS: CN2-12 grossly intact.Power, tone ,and sensation  are  normal RESP;: chest clear to ascultation CVS: S1,S2, no murmurs. GI: Abdomen soft and non tender EXTREMITIES: No edema SKIN: Intact, no visible lesions or rashes.   Diabetes Management Exam:    Foot Exam (with socks and/or shoes not present):       Sensory-Pinprick/Light touch:          Left medial foot (L-4): normal          Left dorsal foot (L-5): normal          Left lateral foot (S-1): normal          Right medial foot (L-4): normal          Right dorsal foot (L-5): normal          Right lateral foot (S-1): normal       Inspection:          Left foot: normal          Right foot: normal       Nails:          Left foot: normal          Right foot: normal    Eye Exam:       Eye Exam done  elsewhere    Impression & Recommendations:  Problem # 1:  MORBID OBESITY (ICD-278.01) Assessment: Unchanged  Orders: T-Basic Metabolic Panel [id number]) T-TSH [id number])   Problem # 2:  HYPERTENSION (ICD-401.9) Assessment: Improved  Her updated medication list for this problem includes:    Benazepril-hydrochlorothiazide 20-12.5 Mg Tabs (Benazepril-hydrochlorothiazide) .Marland Kitchen..Marland Kitchen Two tab by mouth once daily    Nifedipine 90 Mg Xr24h-tab (Nifedipine) ..... One tab by mouth once daily  Orders: T-Hepatic Function [phone]) T-Lipid Profile [id number])  BP today: 110/64 Prior BP: 140/86 (02/02/2008)  Labs Reviewed: Creat: 0.74 (12/29/2007) Chol: 156 (12/30/2007)   HDL: 57 (12/30/2007)   LDL: 89 (12/30/2007)   TG: 49 (12/30/2007)   Problem # 3:  DIABETES, TYPE 2 (ICD-250.00) Assessment: Improved  Her updated medication list for this problem includes:    Glipizide 10 Mg Tb24 (Glipizide) ..... One tab by mouth two times a day    Benazepril-hydrochlorothiazide 20-12.5 Mg Tabs (Benazepril-hydrochlorothiazide) .Marland Kitchen..Marland Kitchen Two tab by mouth once daily    Actoplus Met 15-850 Mg Tabs (Pioglitazone hcl-metformin hcl) ..... One tab by mouth bid  Orders: Glucose, (CBG) (82962) Hemoglobin A1C (28413) Ophthalmology Referral (Ophthalmology)  Labs Reviewed: HgBA1c: 6.7 (03/31/2008)   Creat: 0.74 (12/29/2007)   Microalbumin: 3.10 (06/27/2007)   Problem # 4:  ALLERGIC RHINITIS (ICD-477.9) Assessment: Deteriorated  Her updated medication list for this problem includes:    Rhinocort Aqua 32 Mcg/act Susp (Budesonide (nasal)) ..... One spray each nostril once daily    Allegra 180 Mg Tabs (Fexofenadine hcl) ..... One tab by mouth once daily    Patanase 0.6 % Soln (Olopatadine hcl) .Marland Kitchen... 1-2 sprays each nostril two times a day as needed    Astepro 137 Mcg/spray Soln (Azelastine hcl) .Marland Kitchen... 2 puffs per nostril twice daily pt is noncompliant with meds  Complete Medication List: 1)   Rhinocort Aqua 32 Mcg/act Susp (Budesonide (nasal)) .... One spray each nostril once daily 2)  Glipizide 10 Mg Tb24 (Glipizide) .... One tab by mouth two times a day 3)  Allegra 180 Mg Tabs (Fexofenadine hcl) .... One tab by mouth once daily 4)  Benazepril-hydrochlorothiazide 20-12.5 Mg Tabs (Benazepril-hydrochlorothiazide) .... Two tab by mouth once daily 5)  Zegerid 40-1100 Mg Caps (Omeprazole-sodium bicarbonate) .... Take 1 tablet by mouth once a day 6)  Patanase 0.6 % Soln (Olopatadine hcl) .Marland Kitchen.. 1-2 sprays each nostril two times a day as needed 7)  Cpap 8 [facility]  8)  Nifedipine 90 Mg Xr24h-tab (Nifedipine) .... One tab by mouth once daily 9)  Actoplus Met 15-850 Mg Tabs (Pioglitazone hcl-metformin hcl) .... One tab by mouth bid 10)  Ibu 800 Mg Tabs (Ibuprofen) .... Take 1 tablet by mouth two times a day as needed 11)  Astepro 137 Mcg/spray Soln (Azelastine hcl) .... 2 puffs per nostril twice daily   Patient Instructions: 1)  Please schedule a follow-up appointment in 3 months. 2)  It is important that you exercise regularly at least 20 minutes 5 times a week. If you develop chest pain, have severe difficulty breathing, or feel very tired , stop exercising immediately and seek medical attention. 3)  You need to lose weight. Consider a lower calorie diet and regular exercise.  4)  CONGRATS on theblood sugar. 5)  You will start to take the meds for allergies daily. 6)  I will precribe a strong pain med for use as needed.     Prescriptions: GLIPIZIDE 10 MG  TB24 (GLIPIZIDE) one tab by mouth two times a day  #60 Tablet x 5   Entered and Authorized by:   Syliva Overman MD   Signed by:   Syliva Overman MD on 04/04/2008   Method used:   [facility]ronically to        [facility] [phone][facility] (retail)       [address]       [place], [place]  [address]       Ph: [phone] or [phone]       Fax: [phone]   RxID:   [id number]   Laboratory  Results   Blood Tests   Date/Time Received: 03/31/08 10:21am Date/Time Reported: 03/31/08 10:21am  Glucose (fasting): 118 mg/dL   (Normal Range: 36-644) HGBA1C: 6.7%   (Normal Range: Non-Diabetic - 3-6%   Control Diabetic - 6-8%)    note: the prescriptions were handwritten and given to the pt.  Appended Document: office visit        Current Allergies: ! PERCOCET ! PCN        Complete Medication List: 1)  Rhinocort Aqua 32 Mcg/act Susp (Budesonide (nasal)) .... One spray each nostril once daily 2)  Glipizide 10 Mg Tb24 (Glipizide) .... One tab by mouth two times a day 3)  Allegra 180 Mg Tabs (Fexofenadine hcl) .... One tab by mouth once daily 4)  Benazepril-hydrochlorothiazide 20-12.5 Mg Tabs (Benazepril-hydrochlorothiazide) .... Two tab by mouth once daily 5)  Zegerid 40-1100 Mg Caps (Omeprazole-sodium bicarbonate) .... Take 1 tablet by mouth once a day 6)  Patanase 0.6 % Soln (Olopatadine hcl) .Marland Kitchen.. 1-2 sprays each nostril two times a day as needed 7)  Cpap 8 [facility]  8)  Nifedipine 90 Mg Xr24h-tab (Nifedipine) .... One tab by mouth once daily 9)  Actoplus Met 15-850 Mg Tabs (Pioglitazone hcl-metformin hcl) .... One tab by mouth bid 10)  Ibu 800 Mg Tabs (Ibuprofen) .... Take 1 tablet by mouth two times a day as needed 11)  Astepro 137 Mcg/spray Soln (Azelastine hcl) .... 2 puffs per nostril twice daily 12)  [facility]othe[facility] Cough Syrup  .Marland Kitchen.. 1 tsp every 6 hours as needed    Prescriptions: [facility] COUGH SYRUP 1 tsp every 6 hours as needed  #120 mL x 0   Entered and Authorized by:  Syliva Overman MD   Signed by:   Syliva Overman MD on 04/06/2008   Method used:   Handwritten   RxID:   [id number]

## 2010-04-11 NOTE — Progress Notes (Signed)
Summary: REF  Phone Note Call from Patient   Summary of Call: WANTS TO KNOW HAVE YOU SEND OVER HER PAPERS FOR DR. Leodis Binet BACK AT [phone] Initial call taken by: [name],  January 26, 2009 9:04 AM  Follow-up for Phone Call        referred pt to dr.reynolds office for numbness. Faxed information and there referral person will call pt with appt. pt notified  Follow-up by: Rudene Anda,  January 26, 2009 10:20 AM

## 2010-04-11 NOTE — Assessment & Plan Note (Signed)
Summary: stomach hurting/ss   Visit Type:  Follow-up Visit Primary Care Provider:  simpson  CC:  abdominal pain.  History of Present Illness: Michaela Peterson presents today with complaints of epigastric pain. She has a history of gastroparesis, diabetes,  iron deficiency anemia, and mildly elevated transaminases secondary to NAFLD. She last saw [place] in May 2011, where she was doing fairly well but was instructed to take Dexilant daily. She states that she used the samples given, but she never filled the actual prescription. Reports epigastric pain started mid-September, reports as constant, dull ache. Eating/drinking sometimes worsens. +intermittent nausea, worsened with eating/drinking. Uses stool softeners daily, 1-2 BM daily, no brbpr or melena. Takes Zegerid as needed. Hasn't seen gynecologist yet (due to heavy menses),she is hesitant to do this but understands the importance. Last EGD/colonoscopy September 2010. Takes ibuprofen several tiems a week. LFTs done Sept 2011 with PCP.   Current Medications (verified): 1)  Glipizide 10 Mg  Tb24 (Glipizide) .... One Tab By Mouth Two Times A Day 2)  Cpap 8 [facility] 3)  Nifedipine 90 Mg Xr24h-Tab (Nifedipine) .... One Tab By Mouth Once Daily 4)  Onetouch Ultra Test  Strp (Glucose Blood) .... Test One Time Daily. 5)  Janumet 50-1000 Mg Tabs (Sitagliptin-Metformin Hcl) .... Take 1 Tablet By Mouth Two Times A Day 6)  Lancets and Strips .... Two Times A Day Testing For Relion Meter 7)  Ibuprofen 800 Mg Tabs (Ibuprofen) .... Take 1 Tablet By Mouth Two Times A Day As Needed 8)  Restasis 0.05 % Emul (Cyclosporine) .... As Directed 9)  Stool Softner .Marland Kitchen.. 2-3 Tablets Daily  Allergies (verified): 1)  ! Percocet 2)  ! Pcn 3)  ! Jonne Ply  Past History:  Past Medical History: Allergic Rhinitis Diabetes, Type 2 Hypertension Sleep Apnea NPSG 05/09/07 AHI 12.3, ESS 16/24 MS Gastroparesis IDA Left complex cystic adnexal mass followed by Dr.  Emelda Fear EGD/Colonoscopy Sept 2010: EGD: 1. Normal esophagus, small hiatal hernia,     questionable pale duodenal mucosa, status post biopsy. 2. Colonoscopy findings:  Normal rectum, few scattered diverticula,     diminutive polyp at base of ileocecal valve, status post cold     biopsy.  Remainder of the colonic mucosa and terminal mucosa     appeared normal.    Review of Systems General:  Denies fever, chills, and anorexia. Eyes:  Denies blurring, diplopia, and irritation. ENT:  Denies earache, sore throat, hoarseness, and difficulty swallowing. CV:  Denies chest pains, palpitations, and syncope. Resp:  Denies dyspnea at rest, cough, and wheezing. GI:  Complains of nausea and abdominal pain; denies difficulty swallowing, pain on swallowing, constipation, change in bowel habits, and bloody BM's. GU:  Denies urinary burning, blood in urine, and urinary frequency. MS:  Denies joint pain / LOM, joint swelling, and joint stiffness. Derm:  Denies rash, itching, and dry skin. Neuro:  Denies weakness, syncope, and headache. Psych:  Denies depression and anxiety.  Vital Signs:  Patient profile:   55 year old female Menstrual status:  perimenopausal Height:      61.5 inches Weight:      202.50 pounds BMI:     37.78 Temp:     98.1 degrees F oral Pulse rate:   72 / minute BP sitting:   138 / 82  (left arm) Cuff size:   large  Vitals Entered By: Cloria Spring LPN (January 10, 2010 9:51 AM)  Physical Exam  General:  Well developed, well nourished, no acute distress.  Head:  Normocephalic and atraumatic. Eyes:  sclera non-icteric Mouth:  No deformity or lesions, dentition normal. Lungs:  Clear throughout to auscultation. Heart:  Regular rate and rhythm; no murmurs, rubs,  or bruits. Abdomen:  normal bowel sounds, obese, epigastric tenderness, without guarding, without rebound, and no hepatomegally or splenomegaly.   Msk:  Symmetrical with no gross deformities. Normal posture. Pulses:   Normal pulses noted. Extremities:  No clubbing, cyanosis, edema or deformities noted. Neurologic:  Alert and  oriented x4;  grossly normal neurologically.  Impression & Recommendations:  Problem # 1:  EPIGASTRIC PAIN (ICD-789.06)  Epigastric pain X 1 month, exacerbated by eating/drinking. +nausea. Was prescribed Dexilant but only took the samples, never filled prescription. Takes Zegerid as needed. Ibuprofen several times/week.   [facility]  Instructed on importance of daily dosing, not skipping, giving adequate time for PPI to take effect Contact us if no improvement in 10-14 days  Orders: Est. Patient Level III (46270)  Problem # 2:  NAUSEA (ICD-787.02)  Likely secondary to epigastric pain. See #1  Orders: Est. Patient Level III (35009)  Patient Instructions: 1)  Dexilant daily, call if no improvement 2)  Gastroparesis diet 3)  Avoid Ibuprofen 4)  Have LFTs drawn in mid-December   Appended Document: stomach hurting/ss    Prescriptions: DEXILANT 60 MG CPDR (DEXLANSOPRAZOLE) take one by mouth daily  #30 x 11   Entered and Authorized by:   Gerrit Halls NP   Signed by:   Gerrit Halls NP on 01/10/2010   Method used:   [facility]       [facility]  [address] [phone][facility] ([facility]ail[facility]       [address]       [place], [place]  [address]       Ph: [phone] or [phone]       Fax: [phone]   RxID:   [id number]

## 2010-04-11 NOTE — Assessment & Plan Note (Signed)
Summary: office visit   Vital Signs:  Patient profile:   55 year old female Menstrual status:  perimenopausal Height:      61.5 inches Weight:      204.06 pounds BMI:     38.07 O2 Sat:      96 % Pulse rate:   72 / minute Pulse rhythm:   regular Resp:     16 per minute BP sitting:   160 / 98  (left arm) Cuff size:   large  Vitals Entered By: Everitt Amber (January 12, 2009 10:16 AM)  Nutrition Counseling: Patient's BMI is greater than 25 and therefore counseled on weight management options. CC: Follow up chronic problems, states she is hurting all over, knees and shoulders. She feel the friday before labor day and she has been having numbness off and on in different places since. Sometimes in leg and ankle bone   Primary Care Provider:  Syliva Overman  CC:  Follow up chronic problems, states she is hurting all over, and knees and shoulders. She feel the friday before labor day and she has been having numbness off and on in different places since. Sometimes in leg and ankle bone.  History of Present Illness: Reports  that she has been doing fairly well. she unfortunately reports that she has not been as diligent in taking her medsa andfollowing a diabetic diet as she should. She recently fell, about 6 weeks ago, since then she has experienced increased numbness, she has MS and has not seen her neurologist for years and is on no medication. Denies recent fever or chills. Denies sinus pressure, nasal congestion , ear pain or sore throat. Denies chest congestion, or cough productive of sputum. Denies chest pain, palpitations, PND, orthopnea or leg swelling. Denies abdominal pain, nausea, vomitting, diarrhea or constipation. Denies change in bowel movements or bloody stool.   Denies headaches, vertigo, seizures. Denies depression, anxiety or insomnia. Denies  rash, lesions, or itch.     Current Medications (verified): 1)  Glipizide 10 Mg  Tb24 (Glipizide) .... One Tab By Mouth  Two Times A Day 2)  Allegra 180 Mg  Tabs (Fexofenadine Hcl) .... One Tab By Mouth Once Daily 3)  Benazepril-Hydrochlorothiazide 20-12.5 Mg  Tabs (Benazepril-Hydrochlorothiazide) .... Two Tab By Mouth Once Daily 4)  Patanase 0.6 %  Soln (Olopatadine Hcl) .Marland Kitchen.. 1-2 Sprays Each Nostril Two Times A Day As Needed 5)  Cpap 8 [facility] 6)  Nifedipine 90 Mg Xr24h-Tab (Nifedipine) .... One Tab By Mouth Once Daily 7)  Astepro 137 Mcg/spray Soln (Azelastine Hcl) .... 2 Puffs Per Nostril Twice Daily 8)  Singulair 10 Mg Tabs (Montelukast Sodium) .... Take 1 Tablet By Mouth Once A Day 9)  Janumet 50-1000 Mg Tabs (Sitagliptin-Metformin Hcl) .... Take 1 Tablet By Mouth Two Times A Day 10)  Benadryl 25 Mg Caps (Diphenhydramine Hcl) .... As Needed 11)  Multivitamins  Tabs (Multiple Vitamin) .... Once Daily 12)  Dexilant 60 Mg Cpdr (Dexlansoprazole) .... One By Mouth 30 Mins Before Breakfast 13)  Promethazine Hcl 25 Mg Tabs (Promethazine Hcl) .... 1/2 To One By Mouth Every 6 Hours As Needed N/v.  Be Aware of Sedation. 14)  Docusate Sodium 100 Mg Caps (Docusate Sodium) .... Take 1 Tablet By Mouth Once A Day As Needed  Allergies (verified): 1)  ! Percocet 2)  ! Pcn 3)  ! Jonne Ply  Family History:    Mother deceased in 2008-08-08, ventricular fibbrilation with cardiac arrest Father deceased secondary to stroke,  age 60 No FH of Colon Cancer:  Social History: Reviewed history from 11/02/2008 and no changes required. Patient never smoked.  Positive history of passive tobacco smoke exposure- lives with smokers.  Married, adult children, 3, all healthy. unemployed.  Review of Systems      See HPI Eyes:  Denies blurring, discharge, and red eye. GU:  Complains of dysuria and urinary frequency; 3 day history. MS:  Complains of joint pain, joint swelling, low back pain, mid back pain, muscle aches, and stiffness; increased joint andgeneralised pains since recent fall. Endo:  Denies cold intolerance,  excessive hunger, excessive thirst, excessive urination, heat intolerance, polyuria, and weight change; tests on avg 3 times weekly, reports that her sugars are "high", she does not have her meter or log book. Allergy:  Complains of seasonal allergies.  Physical Exam  General:  Well-developed,obese,in no acute distress; alert,appropriate and cooperative throughout examination HEENT: No facial asymmetry,  EOMI, No sinus tenderness, TM's Clear, oropharynx  pink and moist.   Chest: Clear to auscultation bilaterally.  CVS: S1, S2, No murmurs, No S3.   Abd: Soft, Nontender.  MS: Adequate ROM spine, hips, shoulders and knees.  Ext: No edema.   CNS: CN 2-12 intact, power tone and sensation normal throughout.   Skin: Intact, no visible lesions or rashes.  Psych: Good eye contact, normal affect.  Memory intact, not anxious or depressed appearing.    Impression & Recommendations:  Problem # 1:  ACUTE CYSTITIS (ICD-595.0) Assessment Comment Only  Orders: UA Dipstick W/ Micro (manual) (09811)  Encouraged to push clear liquids, get enough rest, and take acetaminophen as needed. To be seen in 10 days if no improvement, sooner if worse.  Problem # 2:  MORBID OBESITY (ICD-278.01) Assessment: Deteriorated  Ht: 61.5 (01/12/2009)   Wt: 204.06 (01/12/2009)   BMI: 38.07 (01/12/2009)  Problem # 3:  HYPERTENSION (ICD-401.9) Assessment: Deteriorated  Her updated medication list for this problem includes:    Benazepril-hydrochlorothiazide 20-12.5 Mg Tabs (Benazepril-hydrochlorothiazide) .Marland Kitchen..Marland Kitchen Two tab by mouth once daily    Nifedipine 90 Mg Xr24h-tab (Nifedipine) ..... One tab by mouth once daily  BP today: 160/98 Prior BP: 138/100 (12/29/2008), pt non compliant with meds prescribed  Labs Reviewed: K+: 3.9 (01/10/2009) Creat: : 0.70 (01/10/2009)   Chol: 171 (01/10/2009)   HDL: 57 (01/10/2009)   LDL: 97 (01/10/2009)   TG: 83 (01/10/2009)  Problem # 4:  DIABETES, TYPE 2 (ICD-250.00) Assessment:  Deteriorated  The following medications were removed from the medication list:    Janumet 50-1000 Mg Tabs (Sitagliptin-metformin hcl) .Marland Kitchen... Take 1 tablet by mouth two times a day Her updated medication list for this problem includes:    Glipizide 10 Mg Tb24 (Glipizide) ..... One tab by mouth two times a day    Benazepril-hydrochlorothiazide 20-12.5 Mg Tabs (Benazepril-hydrochlorothiazide) .Marland Kitchen..Marland Kitchen Two tab by mouth once daily    Actoplus Met 15-850 Mg Tabs (Pioglitazone hcl-metformin hcl) .Marland Kitchen... Take 1 tablet by mouth two times a day  Orders: Glucose, (CBG) (91478) Hemoglobin A1C (29562)  Labs Reviewed: Creat: 0.70 (01/10/2009)    Reviewed HgBA1c results: 8.0 (01/12/2009)  7.8 (10/05/2008)  Problem # 5:  MULTIPLE SCLEROSIS (ICD-340) Assessment: Comment Only recommendation again made that she sched an appt with her neurologist esp in light of c/o generalised numbness  Problem # 6:  ALLERGIC RHINITIS (ICD-477.9) Assessment: Unchanged  The following medications were removed from the medication list:    Astelin 137 Mcg/spray Soln (Azelastine hcl) .Marland Kitchen... 2 sprays two times a day as  needed Her updated medication list for this problem includes:    Allegra 180 Mg Tabs (Fexofenadine hcl) ..... One tab by mouth once daily    Patanase 0.6 % Soln (Olopatadine hcl) .Marland Kitchen... 1-2 sprays each nostril two times a day as needed    Astepro 137 Mcg/spray Soln (Azelastine hcl) .Marland Kitchen... 2 puffs per nostril twice daily    Benadryl 25 Mg Caps (Diphenhydramine hcl) .Marland Kitchen... As needed    Promethazine Hcl 25 Mg Tabs (Promethazine hcl) .Marland Kitchen... 1/2 to one by mouth every 6 hours as needed n/v.  be aware of sedation.  Complete Medication List: 1)  Glipizide 10 Mg Tb24 (Glipizide) .... One tab by mouth two times a day 2)  Allegra 180 Mg Tabs (Fexofenadine hcl) .... One tab by mouth once daily 3)  Benazepril-hydrochlorothiazide 20-12.5 Mg Tabs (Benazepril-hydrochlorothiazide) .... Two tab by mouth once daily 4)  Patanase 0.6 %  Soln (Olopatadine hcl) .Marland Kitchen.. 1-2 sprays each nostril two times a day as needed 5)  Cpap 8 [facility]  6)  Nifedipine 90 Mg Xr24h-tab (Nifedipine) .... One tab by mouth once daily 7)  Astepro 137 Mcg/spray Soln (Azelastine hcl) .... 2 puffs per nostril twice daily 8)  Singulair 10 Mg Tabs (Montelukast sodium) .... Take 1 tablet by mouth once a day 9)  Benadryl 25 Mg Caps (Diphenhydramine hcl) .... As needed 10)  Multivitamins Tabs (Multiple vitamin) .... Once daily 11)  Dexilant 60 Mg Cpdr (Dexlansoprazole) .... One by mouth 30 mins before breakfast 12)  Promethazine Hcl 25 Mg Tabs (Promethazine hcl) .... 1/2 to one by mouth every 6 hours as needed n/v.  be aware of sedation. 13)  Docusate Sodium 100 Mg Caps (Docusate sodium) .... Take 1 tablet by mouth once a day as needed 14)  Actoplus Met 15-850 Mg Tabs (Pioglitazone hcl-metformin hcl) .... Take 1 tablet by mouth two times a day 15)  Onetouch Ultra Test Strp (Glucose blood) .... Test one time daily.  Patient Instructions: 1)  F/U in 7 weeks. 2)  It is important that you exercise regularly at least 20 minutes 5 times a week. If you develop chest pain, have severe difficulty breathing, or feel very tired , stop exercising immediately and seek medical attention. 3)  You need to lose weight. Consider a lower calorie diet and regular exercise.  4)  Check your blood sugars regularly. If your readings are usually above : or below 70 you should contact our office. 5)  Med changes as discussed. 6)  PLEASE TAKE your BP meds every day as prescribed, ansd PLS  test  once daily and record. 7)  Pls contact Dr. Thad Ranger about your new numbness, and also Jernnifer for nutrition counselling.  Prescriptions: ONETOUCH ULTRA TEST  STRP (GLUCOSE BLOOD) Test one time daily.  #1 month x 5   Entered by:   Everitt Amber   Authorized by:   Syliva Overman MD   Signed by:   Everitt Amber on 01/12/2009   Method used:   Electronically to        [facility]  [address]  [phone]* (retail)       [address]       [place], [place]  [address]       Ph: [phone] or [phone]       Fax: [phone]   RxID:   [id number] BENAZEPRIL-HYDROCHLOROTHIAZIDE 20-12.5 MG  TABS (BENAZEPRIL-HYDROCHLOROTHIAZIDE) two tab by mouth once daily  #60 Tablet x 4  Entered by:   Worthy Keeler LPN   Authorized by:   Syliva Overman MD   Signed by:   Worthy Keeler LPN on 16/12/9602   Method used:   Electronically to        [facility] [phone]* (retail)       [address]       [place], [place]  [address]       Ph: [phone] or [phone]       Fax: [phone]   RxID:   [id number] ACTOPLUS MET 15-850 MG TABS (PIOGLITAZONE HCL-METFORMIN HCL) Take 1 tablet by mouth two times a day  #60 x 4   Entered and Authorized by:   Syliva Overman MD   Signed by:   Syliva Overman MD on 01/12/2009   Method used:   [facility]ronically to        [facility] [phone]* (retail)       [address]       [place], [place]  [address]       Ph: [phone] or [phone]       Fax: [phone]   RxID:   [id number]   Laboratory Results   Urine Tests  Date/Time Received: January 12, 2009  Date/Time Reported: January 12, 2009   Routine Urinalysis   Color: yellow Glucose: negative   (Normal Range: Negative) Bilirubin: small   (Normal Range: Negative) Ketone: trace (5)   (Normal Range: Negative) Spec. Gravity: 1.025   (Normal Range: 1.003-1.035) Blood: negative   (Normal Range: Negative) pH: 5.5   (Normal Range: 5.0-8.0) Protein: trace   (Normal Range: Negative) Urobilinogen: 0.2   (Normal Range: 0-1) Nitrite: negative   (Normal Range: Negative) Leukocyte Esterace: negative   (Normal Range: Negative)     Blood Tests   Date/Time Received: January 12, 2009  Date/Time Reported: January 12, 2009   Glucose (random): 161 mg/dL   (Normal Range: 62-831) HGBA1C: 8.0%   (Normal Range: Non-Diabetic - 3-6%   Control  Diabetic - 6-8%)

## 2010-04-11 NOTE — Assessment & Plan Note (Signed)
Summary: office visit   Vital Signs:  Patient profile:   55 year old female Menstrual status:  perimenopausal Height:      61.5 inches Weight:      211 pounds BMI:     39.36 O2 Sat:      98 % Pulse rate:   91 / minute Pulse rhythm:   regular Resp:     16 per minute BP sitting:   112 / 80  (left arm) Cuff size:   large  Vitals Entered By: Everitt Amber (October 05, 2008 10:47 AM)  Nutrition Counseling: Patient's BMI is greater than 25 and therefore counseled on weight management options. CC: Follow up chronic problems Is Patient Diabetic? Yes   Primary Care Provider:  Lodema Hong  CC:  Follow up chronic problems.  History of Present Illness: the pt is still grieving the recnt unexpected loss of her mother. As a result she has not been as diligent with diet and exercise as she had been and her blood sugar control has deteriorated.She denies poluria, polydypsia, blurred vision or hypoglycemic episodes. She is not testing regularly as she staes the supplies are too expensive. I have suggested she call her ins company to see if they favor A PARTICULAR TYPE OF METER TO HELP CONTAIN THE COST. sHE HAS NASAL AND SINUS CONGESTION AND PRESSURE, BUT SHE DENIES FEVER , CHILLS, SORE THROAT, COUGH OR YELLOW SPUTUM. sHE DENIES CHEST PAIN, palpitations , pnd or orthopnea.  Current Medications (verified): 1)  Glipizide 10 Mg  Tb24 (Glipizide) .... One Tab By Mouth Two Times A Day 2)  Allegra 180 Mg  Tabs (Fexofenadine Hcl) .... One Tab By Mouth Once Daily 3)  Benazepril-Hydrochlorothiazide 20-12.5 Mg  Tabs (Benazepril-Hydrochlorothiazide) .... Two Tab By Mouth Once Daily 4)  Zegerid 40-1100 Mg  Caps (Omeprazole-Sodium Bicarbonate) .... Take 1 Tablet By Mouth Once A Day 5)  Patanase 0.6 %  Soln (Olopatadine Hcl) .Marland Kitchen.. 1-2 Sprays Each Nostril Two Times A Day As Needed 6)  Cpap 8 [facility] 7)  Nifedipine 90 Mg Xr24h-Tab (Nifedipine) .... One Tab By Mouth Once Daily 8)  Astepro 137 Mcg/spray  Soln (Azelastine Hcl) .... 2 Puffs Per Nostril Twice Daily 9)  Singulair 10 Mg Tabs (Montelukast Sodium) .... Take 1 Tablet By Mouth Once A Day 10)  Janumet 50-1000 Mg Tabs (Sitagliptin-Metformin Hcl) .... Take 1 Tablet By Mouth Two Times A Day  Allergies (verified): 1)  ! Percocet 2)  ! Pcn  Review of Systems      See HPI General:  Complains of fatigue; denies chills and fever. ENT:  See HPI. CV:  See HPI. Resp:  See HPI. GI:  Denies abdominal pain, constipation, diarrhea, nausea, and vomiting. GU:  Denies dysuria and urinary frequency; recently hd her pap, supposedly nl. MS:  Denies joint pain, low back pain, and muscle aches. Derm:  Denies itching, lesion(s), and rash. Psych:  See HPI; Denies depression. Endo:  See HPI.  Physical Exam  General:  alert, well-hydrated, and overweight-appearing. HEENT: No facial asymmetry,  EOMI, No sinus tenderness, TM's Clear, oropharynx  pink and moist.   Chest: Clear to auscultation bilaterally.  CVS: S1, S2, No murmurs, No S3.   Abd: Soft, Nontender.  MS: Adequate ROM spine, hips, shoulders and knees.  Ext: No edema.   CNS: CN 2-12 intact, power tone and sensation normal throughout.   Skin: Intact, no visible lesions or rashes.  Psych: Good eye contact, normal affect.  Memory intact, not anxious or depressed  appearing. ]    Diabetes Management Exam:    Foot Exam (with socks and/or shoes not present):       Sensory-Monofilament:          Left foot: normal          Right foot: normal       Inspection:          Left foot: normal          Right foot: normal       Nails:          Left foot: normal          Right foot: normal   Impression & Recommendations:  Problem # 1:  GRIEF REACTION, ACUTE (ICD-309.0) Assessment Improved  Problem # 2:  MORBID OBESITY (ICD-278.01) Assessment: Unchanged  Orders: T-Hepatic Function ([id number]) T-Lipid Profile (14782-95621)  Ht: 61.5 (10/05/2008)   Wt: 211 (10/05/2008)   BMI: 39.36  (10/05/2008)  Problem # 3:  HYPERTENSION (ICD-401.9) Assessment: Improved  Her updated medication list for this problem includes:    Benazepril-hydrochlorothiazide 20-12.5 Mg Tabs (Benazepril-hydrochlorothiazide) .Marland Kitchen..Marland Kitchen Two tab by mouth once daily    Nifedipine 90 Mg Xr24h-tab (Nifedipine) ..... One tab by mouth once daily  Orders: T-Basic Metabolic Panel [id number])  BP today: 112/80 Prior BP: 150/90 (09/07/2008)  Labs Reviewed: K+: 4.0 (07/01/2008) Creat: : 0.66 (07/01/2008)   Chol: 171 (07/01/2008)   HDL: 60 (07/01/2008)   LDL: 97 (07/01/2008)   TG: 70 (07/01/2008)  Problem # 4:  DIABETES, TYPE 2 (ICD-250.00) Assessment: Deteriorated  Her updated medication list for this problem includes:    Glipizide 10 Mg Tb24 (Glipizide) ..... One tab by mouth two times a day    Benazepril-hydrochlorothiazide 20-12.5 Mg Tabs (Benazepril-hydrochlorothiazide) .Marland Kitchen..Marland Kitchen Two tab by mouth once daily    Janumet 50-1000 Mg Tabs (Sitagliptin-metformin hcl) .Marland Kitchen... Take 1 tablet by mouth two times a day  Orders: Glucose, (CBG) (82962) Hemoglobin A1C (83036)  Labs Reviewed: Creat: 0.66 (07/01/2008)    Reviewed HgBA1c results: 7.8 (10/05/2008)  7.0 (07/01/2008)  Problem # 5:  ALLERGIC RHINITIS (ICD-477.9) Assessment: Improved  Her updated medication list for this problem includes:    Allegra 180 Mg Tabs (Fexofenadine hcl) ..... One tab by mouth once daily    Patanase 0.6 % Soln (Olopatadine hcl) .Marland Kitchen... 1-2 sprays each nostril two times a day as needed    Astepro 137 Mcg/spray Soln (Azelastine hcl) .Marland Kitchen... 2 puffs per nostril twice daily  Complete Medication List: 1)  Glipizide 10 Mg Tb24 (Glipizide) .... One tab by mouth two times a day 2)  Allegra 180 Mg Tabs (Fexofenadine hcl) .... One tab by mouth once daily 3)  Benazepril-hydrochlorothiazide 20-12.5 Mg Tabs (Benazepril-hydrochlorothiazide) .... Two tab by mouth once daily 4)  Zegerid 40-1100 Mg Caps (Omeprazole-sodium bicarbonate) .... Take 1  tablet by mouth once a day 5)  Patanase 0.6 % Soln (Olopatadine hcl) .Marland Kitchen.. 1-2 sprays each nostril two times a day as needed 6)  Cpap 8 [facility]  7)  Nifedipine 90 Mg Xr24h-tab (Nifedipine) .... One tab by mouth once daily 8)  Astepro 137 Mcg/spray Soln (Azelastine hcl) .... 2 puffs per nostril twice daily 9)  Singulair 10 Mg Tabs (Montelukast sodium) .... Take 1 tablet by mouth once a day 10)  Janumet 50-1000 Mg Tabs (Sitagliptin-metformin hcl) .... Take 1 tablet by mouth two times a day  Patient Instructions: 1)  Please schedule a follow-up appointment in 3 months. 2)  It is important that you exercise regularly  at least 30 minutes 6 times a week. If you develop chest pain, have severe difficulty breathing, or feel very tired , stop exercising immediately and seek medical attention. 3)  You need to lose weight. Consider a lower calorie diet and regular exercise. 4)  You need to be more compliant with your ditet and your meds. 5)  You are having kidney damage from uncontrolled blood sugars, so it is VITAL that you get your sugars righ . Your BP is greAT. 6)  BMP prior to visit, ICD-9: 7)  Hepatic Panel prior to visit, ICD-9: FASTING IN 8)  Lipid Panel prior to visit, ICD-9:  Laboratory Results   Blood Tests   Date/Time Received: October 05, 2008  Date/Time Reported: October 05, 2008   Glucose (random): 205 mg/dL   (Normal Range: 28-413) HGBA1C: 7.8%   (Normal Range: Non-Diabetic - 3-6%   Control Diabetic - 6-8%)

## 2010-04-11 NOTE — Miscellaneous (Signed)
Summary: Orders Update  Clinical Lists Changes  Orders: Added new Test order of T-Hepatic Function ([id number]) - Signed 

## 2010-04-11 NOTE — Assessment & Plan Note (Signed)
Summary: OV   Vital Signs:  Patient Profile:   55 Years Old Female Height:     61 inches Weight:      204 pounds BMI:     38.68 O2 Sat:      97 % Pulse rate:   87 / minute Resp:     16 per minute BP sitting:   160 / 86  (right arm)  Pt. in pain?   no  Vitals Entered By: Everitt Amber (December 19, 2007 10:35 AM)                  Referred by:  Judie Petit. Lodema Hong PCP:  Lodema Hong  Chief Complaint:  Follow up.  History of Present Illness: Pt. reports that she has been non compliant with meds and diet, she has noted elevated blood sugars for some time but did not think it impt. to call for med adjustments. She denies fever or chills. She denies depression or anxiety. She stays busy helping both children and grandchildren.    Updated Prior Medication List: RHINOCORT AQUA 32 MCG/ACT  SUSP (BUDESONIDE (NASAL)) one spray each nostril once daily METFORMIN HCL 1000 MG  TABS (METFORMIN HCL) one tab by mouth two times a day GLIPIZIDE 10 MG  TB24 (GLIPIZIDE) one tab by mouth two times a day ALLEGRA 180 MG  TABS (FEXOFENADINE HCL) one tab by mouth once daily BENAZEPRIL-HYDROCHLOROTHIAZIDE 20-12.5 MG  TABS (BENAZEPRIL-HYDROCHLOROTHIAZIDE) one tab by mouth once daily ZEGERID 40-1100 MG  CAPS (OMEPRAZOLE-SODIUM BICARBONATE) Take 1 tablet by mouth once a day PATANASE 0.6 %  SOLN (OLOPATADINE HCL) 1-2 sprays each nostril two times a day as needed * CPAP 8 [facility]  NIFEDIPINE 90 MG XR24H-TAB (NIFEDIPINE) one tab by mouth once daily COLON CLEANSER  CAPS (MISC NATURAL PRODUCTS) One tab as needed  Current Allergies: ! PERCOCET ! PCN     Review of Systems       The patient complains of weight gain.  The patient denies anorexia and fever.    Eyes      Denies blurring, discharge, eye pain, itching, and light sensitivity.  ENT      See HPI  CV      See HPI  Resp      See HPI  GI      Denies abdominal pain, change in bowel habits, constipation, diarrhea, nausea, and  vomiting.  GU      Denies abnormal vaginal bleeding, decreased libido, discharge, dysuria, genital sores, hematuria, incontinence, nocturia, urinary frequency, and urinary hesitancy.  MS      Complains of joint pain, muscle weakness, and stiffness.  Neuro      Denies falling down, memory loss, numbness, poor balance, seizures, and sensation of room spinning.  Endo      Denies excessive thirst and polyuria.  Allergy      Denies hives or rash, persistent infections, and sneezing.   Physical Exam  General:     overweight-appearing.   Head:     Normocephalic and atraumatic without obvious abnormalities. No apparent alopecia or balding. Eyes:     vision grossly intact.   Ears:     External ear exam shows no significant lesions or deformities.  Otoscopic examination reveals clear canals, tympanic membranes are intact bilaterally without bulging, retraction, inflammation or discharge. Hearing is grossly normal bilaterally. Nose:     External nasal examination shows no deformity or inflammation. Nasal mucosa are pink and moist without lesions or exudates. Mouth:  Oral mucosa and oropharynx without lesions or exudates.  Teeth in good repair. Neck:     No deformities, masses, or tenderness noted. Lungs:     Normal respiratory effort, chest expands symmetrically. Lungs are clear to auscultation, no crackles or wheezes. Heart:     Normal rate and regular rhythm. S1 and S2 normal without gallop, murmur, click, rub or other extra sounds. Abdomen:     soft and non-tender.   Extremities:     No clubbing, cyanosis, edema, or deformity noted with normal full range of motion of all joints.   Skin:     Intact without suspicious lesions or rashes Psych:     Cognition and judgment appear intact. Alert and cooperative with normal attention span and concentration. No apparent delusions, illusions, hallucinations  Diabetes Management Exam:    Foot Exam (with socks and/or shoes not  present):       Sensory-Pinprick/Light touch:          Left medial foot (L-4): normal          Left dorsal foot (L-5): normal          Left lateral foot (S-1): normal          Right dorsal foot (L-5): normal          Right lateral foot (S-1): normal       Inspection:          Left foot: normal          Right foot: normal       Nails:          Left foot: normal          Right foot: normal    Eye Exam:       Eye Exam done elsewhere    Impression & Recommendations:  Problem # 1:  MULTIPLE SCLEROSIS (ICD-340) Assessment: Comment Only pt. neds to f/u with her neurologist, she is non compliant with her treatment plan  Problem # 2:  HYPERTENSION (ICD-401.9) Assessment: Deteriorated  Her updated medication list for this problem includes:    Benazepril-hydrochlorothiazide 20-12.5 Mg Tabs (Benazepril-hydrochlorothiazide) ..... One tab by mouth once daily    Nifedipine 90 Mg Xr24h-tab (Nifedipine) ..... One tab by mouth once daily  Orders: T-Basic Metabolic Panel [id number])  BP today: 160/86 Prior BP: 124/74 (06/16/2007)Pt non compliant and often misses meds  Labs Reviewed: Creat: 0.68 (03/14/2007) Chol: 177 (03/14/2007)   HDL: 51 (03/14/2007)   LDL: 106 (03/14/2007)   TG: 98 (03/14/2007)   Problem # 3:  DIABETES, TYPE 2 (ICD-250.00) Assessment: Deteriorated  The following medications were removed from the medication list:    Metformin Hcl 1000 Mg Tabs (Metformin hcl) ..... One tab by mouth two times a day  Her updated medication list for this problem includes:    Glipizide 10 Mg Tb24 (Glipizide) ..... One tab by mouth two times a day    Benazepril-hydrochlorothiazide 20-12.5 Mg Tabs (Benazepril-hydrochlorothiazide) ..... One tab by mouth once daily    Actoplus Met 15-850 Mg Tabs (Pioglitazone hcl-metformin hcl) ..... One tab by mouth bid     Orders: Glucose, (CBG) (82962) Hemoglobin A1C (83036)  Labs Reviewed: HgBA1c: 8.8 (12/19/2007)   Creat: 0.68 (03/14/2007)    Microalbumin: 3.10 (06/27/2007)  The following medications were removed from the medication list:    Metformin Hcl 1000 Mg Tabs (Metformin hcl) ..... One tab by mouth two times a day  Her updated medication list for this problem includes:    Glipizide 10  Mg Tb24 (Glipizide) ..... One tab by mouth two times a day    Benazepril-hydrochlorothiazide 20-12.5 Mg Tabs (Benazepril-hydrochlorothiazide) ..... One tab by mouth once daily    Actoplus Met 15-850 Mg Tabs (Pioglitazone hcl-metformin hcl) ..... One tab by mouth bid    Actoplus Met 15-850 Mg Tabs (Pioglitazone hcl-metformin hcl) ..... One tab by mouth bid   Problem # 4:  MORBID OBESITY (ICD-278.01) Assessment: Deteriorated  Complete Medication List: 1)  Rhinocort Aqua 32 Mcg/act Susp (Budesonide (nasal)) .... One spray each nostril once daily 2)  Glipizide 10 Mg Tb24 (Glipizide) .... One tab by mouth two times a day 3)  Allegra 180 Mg Tabs (Fexofenadine hcl) .... One tab by mouth once daily 4)  Benazepril-hydrochlorothiazide 20-12.5 Mg Tabs (Benazepril-hydrochlorothiazide) .... One tab by mouth once daily 5)  Zegerid 40-1100 Mg Caps (Omeprazole-sodium bicarbonate) .... Take 1 tablet by mouth once a day 6)  Patanase 0.6 % Soln (Olopatadine hcl) .Marland Kitchen.. 1-2 sprays each nostril two times a day as needed 7)  Cpap 8 [facility]  8)  Nifedipine 90 Mg Xr24h-tab (Nifedipine) .... One tab by mouth once daily 9)  Colon Cleanser Caps (Misc natural products) .... One tab as needed 10)  Actoplus Met 15-850 Mg Tabs (Pioglitazone hcl-metformin hcl) .... One tab by mouth bid 11)  Actoplus Met 15-850 Mg Tabs (Pioglitazone hcl-metformin hcl) .... One tab by mouth bid  Other Orders: T-Lipid Profile [id number]) T-Hepatic Function [phone])   Patient Instructions: 1)  Foll0ow up in 6 weeks. 2)  Your blood sugar is high,  3)  STOP metfiormin and resume actoplusmet. 4)  It is important that you exercise regularly at least 20 minutes 5  times a week. If you develop chest pain, have severe difficulty breathing, or feel very tired , stop exercising immediately and seek medical attention. 5)  You need to lose weight. Consider a lower calorie diet and regular exercise.  6)  Check your blood sugars regularly. If your readings are usually above : or below 70 you should contact our office. 7)  PLEASE take your meds as prescribed for blood pressure, it is high. 8)  See your eye doctor yearly to check for diabetic eye damage.   Prescriptions: ACTOPLUS MET 15-850 MG TABS (PIOGLITAZONE HCL-METFORMIN HCL) one tab by mouth bid  #60 x 5   Entered and Authorized by:   Syliva Overman MD   Signed by:   Syliva Overman MD on 12/21/2007   Method used:   Handwritten   RxID:   [id number]  ] Laboratory Results   Blood Tests   Date/Time Received: December 19, 2007 10:00am Date/Time Reported: December 19, 2007 10:00am  Glucose (fasting): 159 mg/dL   (Normal Range: 59-563) HGBA1C: 8.8%   (Normal Range: Non-Diabetic - 3-6%   Control Diabetic - 6-8%)     Appended Document: OV    Clinical Lists Changes  Problems: Added new problem of BACK PAIN (ICD-724.5) Orders: Added new Test order of T-Basic Metabolic Panel [id number]) - Signed Added new Service order of Depo- Medrol 80mg  (J1040) - Signed Added new Service order of Ketorolac-Toradol 15mg  (J8841) - Signed Added new Service order of Admin of Therapeutic Inj  intramuscular or subcutaneous (66063) - Signed       Medication Administration  Injection # 1:    Medication: Depo- Medrol 80mg     Diagnosis: BACK PAIN (ICD-724.5)    Route: IM    Site: RUOQ gluteus    Exp Date: 12/2008  Lot #: [id number]    Mfr: Sicor    Comments: Depo 80mg  given    Patient tolerated injection without complications    Given by: Everitt Amber (December 29, 2007 11:41 AM)  Injection # 2:    Medication: Ketorolac-Toradol 15mg     Diagnosis: BACK PAIN (ICD-724.5)    Route: IM    Site:  LUOQ gluteus    Exp Date: 05/2009    Lot #: [id number]    Mfr: Novaplus    Comments: Toradol 60mg  given    Patient tolerated injection without complications    Given by: Everitt Amber (December 29, 2007 11:41 AM)  Orders Added: 1)  T-Basic Metabolic Panel [[id number]] 2)  Depo- Medrol 80mg  [J1040] 3)  Ketorolac-Toradol 15mg  [J1885] 4)  Admin of Therapeutic Inj  intramuscular or subcutaneous [[id number]]

## 2010-04-11 NOTE — Progress Notes (Signed)
Summary: [facility]  [facility]   Imported By: [name] 09/06/2009 12:59:28  _____________________________________________________________________  External Attachment:    Type:   Image     Comment:   External Document

## 2010-04-11 NOTE — Letter (Signed)
Summary: Patient Notice, Endo Biopsy Results  [facility]  [address]   [place], [place] [address]   Phone: [phone]  Fax: [phone]       November 30, 2008   [address]a [address] [place], [place]  [address] 1956/01/05    Dear Ms. Anastas,  I am pleased to inform you that the biopsies taken during your recent endoscopic examination did not show any evidence of cancer upon pathologic examination.  There was no evidence for celiac disease as well.  Additional information/recommendations:   Continue with the treatment plan as outlined on the day of your exam.  You should have a repeat endoscopic examination in 10 years.  Please call us if you are having persistent problems or have questions about your condition that have not been fully answered at this time.  Sincerely,    R. Roetta Sessions MD  [facility] Ph: [phone]   Fax: [phone]   Appended Document: Patient Notice, Endo Biopsy Results Letter mailed to pt.

## 2010-04-11 NOTE — Medication Information (Signed)
Summary: Tax adviser   Imported By: [name] 03/08/2009 11:42:16  _____________________________________________________________________  External Attachment:    Type:   Image     Comment:   External Document

## 2010-04-11 NOTE — Progress Notes (Signed)
Summary: COUGH  Phone Note Call from Patient   Summary of Call: coughing and sneezing  BUT IT IS WHITE  AND WOULD LIKE SOME COUGH SYRUP [facility] [place] AND HAS BEEN AROUND GRANDCHILDREN AND THIS ALL STARTED WITH A COUGH AND BEING AROUND THE KIDS CALL HER BACK AT [phone] Initial call taken by: [name],  January 26, 2009 9:00 AM  Follow-up for Phone Call        advise and erx tussionex 5 cc twice daily as needed x 300cc no refill pls Follow-up by: Syliva Overman MD,  January 26, 2009 11:51 AM  Additional Follow-up for Phone Call Additional follow up Details #1::        Phone Call Completed, Rx Called In Additional Follow-up by: Worthy Keeler LPN,  January 26, 2009 1:56 PM    New/Updated Medications: [name] ER 8-10 MG/5ML LQCR (CHLORPHENIRAMINE-HYDROCODONE) 5cc by mouth two times a day prn Prescriptions: TUSSIONEX PENNKINETIC ER 8-10 MG/5ML LQCR (CHLORPHENIRAMINE-HYDROCODONE) 5cc by mouth two times a day prn  #300cc x 0   Entered by:   Worthy Keeler LPN   Authorized by:   Syliva Overman MD   Signed by:   Worthy Keeler LPN on 78/29/5621   Method used:   Print[facility] then [facility] ...       [facility]  [address] [phone]* (retail)       [address]       [place], [place]  [address]       Ph: [phone] or [phone]       Fax: [phone]   RxID:   [id number]

## 2010-04-11 NOTE — Miscellaneous (Signed)
Summary: Sensitivity tests/[facility]  Sensitivity tests/[facility]   Imported By: Lester Winchester 08/13/2007 08:34:54  _____________________________________________________________________  External Attachment:    Type:   Image     Comment:   External Document

## 2010-04-11 NOTE — Miscellaneous (Signed)
Summary: Orders Update  Clinical Lists Changes  Orders: Added new Test order of T-CBC w/Diff ([id number]) - Signed 

## 2010-04-11 NOTE — Medication Information (Signed)
Summary: Tax adviser   Imported By: [name] 08/10/2008 08:31:02  _____________________________________________________________________  External Attachment:    Type:   Image     Comment:   External Document

## 2010-04-11 NOTE — Assessment & Plan Note (Signed)
Summary: FOBT/AMS   Pt returned the iFOB and it was negative.     Allergies: 1)  ! Percocet 2)  ! Pcn 3)  ! Asa  Other Orders: Immuno-chemical Fecal Occult ([address])  Appended Document: FOBT/AMS Called and no answer.  Appended Document: FOBT/AMS Pt informed.

## 2010-04-11 NOTE — Assessment & Plan Note (Signed)
Summary: office visit   Vital Signs:  Patient profile:   55 year old female Menstrual status:  perimenopausal Height:      61.5 inches (156.21 cm) Weight:      217 pounds (98.64 kg) BMI:     40.48 O2 Sat:      98 % Pulse rate:   75 / minute Pulse rhythm:   regular Resp:     16 per minute BP sitting:   120 / 80  (left arm) Cuff size:   large  Vitals Entered By: Everitt Amber (July 01, 2008 10:07 AM)  Nutrition Counseling: Patient's BMI is greater than 25 and therefore counseled on weight management options. CC: Sore throat at night and nasal drainage  years   days  Menstrual Status perimenopausal Last PAP Result Normal   Primary Care Provider:  Lodema Hong  CC:  Sore throat at night and nasal drainage.  History of Present Illness: Pt has a 1 week h/o progressive facial pressure, with green nasal drainage, cough which is noin productive, fatigue and chills.She denies sore throat. She denies depression or anxiety. She has been non compliant with diet and exercise with weight gain and deterioration inher blood sugar control. She denies significant joint pain and stiffness.  Current Medications (verified): 1)  Glipizide 10 Mg  Tb24 (Glipizide) .... One Tab By Mouth Two Times A Day 2)  Allegra 180 Mg  Tabs (Fexofenadine Hcl) .... One Tab By Mouth Once Daily 3)  Benazepril-Hydrochlorothiazide 20-12.5 Mg  Tabs (Benazepril-Hydrochlorothiazide) .... Two Tab By Mouth Once Daily 4)  Zegerid 40-1100 Mg  Caps (Omeprazole-Sodium Bicarbonate) .... Take 1 Tablet By Mouth Once A Day 5)  Patanase 0.6 %  Soln (Olopatadine Hcl) .Marland Kitchen.. 1-2 Sprays Each Nostril Two Times A Day As Needed 6)  Cpap 8 [facility] 7)  Nifedipine 90 Mg Xr24h-Tab (Nifedipine) .... One Tab By Mouth Once Daily 8)  Actoplus Met 15-850 Mg Tabs (Pioglitazone Hcl-Metformin Hcl) .... One Tab By Mouth Bid 9)  Ibu 800 Mg Tabs (Ibuprofen) .... Take 1 Tablet By Mouth Two Times A Day As Needed 10)  Astepro 137 Mcg/spray  Soln (Azelastine Hcl) .... 2 Puffs Per Nostril Twice Daily 11)  Vigamox 0.5 % Soln (Moxifloxacin Hcl) .... Instill One Drop Into Both Eyes Once Daily  Allergies (verified): 1)  ! Percocet 2)  ! Pcn  Review of Systems General:  See HPI; Complains of chills, fatigue, and malaise; denies fever. ENT:  See HPI; Complains of postnasal drainage, sinus pressure, and sore throat. CV:  Denies chest pain or discomfort, palpitations, and swelling of feet. Resp:  See HPI; Complains of cough; denies shortness of breath, sputum productive, and wheezing. GI:  Denies abdominal pain, constipation, diarrhea, and indigestion. GU:  Denies dysuria, urinary frequency, and urinary hesitancy. MS:  Denies joint pain and stiffness. Derm:  Denies itching, lesion(s), and rash. Psych:  Denies anxiety and depression.  Physical Exam  General:  alert, well-hydrated, and overweight-appearing. HEENT: No facial asymmetry,  EOMI,maxillary sinus tenderness, TM's Clear, oropharynx  pink and moist.   Chest: Clear to auscultation bilaterally.  CVS: S1, S2, No murmurs, No S3.   Abd: Soft, Nontender.  MS: Adequate ROM spine, hips, shoulders and knees.  Ext: No edema.   CNS: CN 2-12 intact, power tone and sensation normal throughout.   Skin: Intact, no visible lesions or rashes.  Psych: Good eye contact, normal affect.  Memory intact, not anxious or depressed appearing.     Diabetes Management Exam:  Foot Exam (with socks and/or shoes not present):       Sensory-Monofilament:          Left foot: normal          Right foot: normal       Inspection:          Left foot: normal          Right foot: normal       Nails:          Left foot: normal          Right foot: normal   Impression & Recommendations:  Problem # 1:  ACUTE MAXILLARY SINUSITIS (ICD-461.0) Assessment Comment Only  The following medications were removed from the medication list:    Rhinocort Aqua 32 Mcg/act Susp (Budesonide (nasal)) ..... One  spray each nostril once daily Her updated medication list for this problem includes:    Patanase 0.6 % Soln (Olopatadine hcl) .Marland Kitchen... 1-2 sprays each nostril two times a day as needed    Astepro 137 Mcg/spray Soln (Azelastine hcl) .Marland Kitchen... 2 puffs per nostril twice daily    Septra Ds 800-160 Mg Tabs (Sulfamethoxazole-trimethoprim) .Marland Kitchen... Take 1 tablet by mouth two times a day  Problem # 2:  MORBID OBESITY (ICD-278.01) Assessment: Deteriorated  Ht: 61.5 (07/01/2008)   Wt: 217 (07/01/2008)   BMI: 40.48 (07/01/2008)  Problem # 3:  HYPERTENSION (ICD-401.9) Assessment: Unchanged  Her updated medication list for this problem includes:    Benazepril-hydrochlorothiazide 20-12.5 Mg Tabs (Benazepril-hydrochlorothiazide) .Marland Kitchen..Marland Kitchen Two tab by mouth once daily    Nifedipine 90 Mg Xr24h-tab (Nifedipine) ..... One tab by mouth once daily  BP today: 120/80 Prior BP: 110/64 (03/31/2008)  Labs Reviewed: K+: 4.6 (12/29/2007) Creat: : 0.74 (12/29/2007)   Chol: 156 (12/30/2007)   HDL: 57 (12/30/2007)   LDL: 89 (12/30/2007)   TG: 49 (12/30/2007)  Problem # 4:  DIABETES, TYPE 2 (ICD-250.00) Assessment: Deteriorated  The following medications were removed from the medication list:    Actoplus Met 15-850 Mg Tabs (Pioglitazone hcl-metformin hcl) ..... One tab by mouth bid Her updated medication list for this problem includes:    Glipizide 10 Mg Tb24 (Glipizide) ..... One tab by mouth two times a day    Benazepril-hydrochlorothiazide 20-12.5 Mg Tabs (Benazepril-hydrochlorothiazide) .Marland Kitchen..Marland Kitchen Two tab by mouth once daily    Janumet 50-1000 Mg Tabs (Sitagliptin-metformin hcl) .Marland Kitchen... Take 1 tablet by mouth two times a day  Orders: Glucose, (CBG) (82962) Hemoglobin A1C (83036) T-Urine Microalbumin w/creat. ratio [phone] / [id number])  Labs Reviewed: Creat: 0.74 (12/29/2007)    Reviewed HgBA1c results: 7.0 (07/01/2008)  6.7 (03/31/2008)  Complete Medication List: 1)  Glipizide 10 Mg Tb24 (Glipizide) .... One tab by  mouth two times a day 2)  Allegra 180 Mg Tabs (Fexofenadine hcl) .... One tab by mouth once daily 3)  Benazepril-hydrochlorothiazide 20-12.5 Mg Tabs (Benazepril-hydrochlorothiazide) .... Two tab by mouth once daily 4)  Zegerid 40-1100 Mg Caps (Omeprazole-sodium bicarbonate) .... Take 1 tablet by mouth once a day 5)  Patanase 0.6 % Soln (Olopatadine hcl) .Marland Kitchen.. 1-2 sprays each nostril two times a day as needed 6)  Cpap 8 [facility]  7)  Nifedipine 90 Mg Xr24h-tab (Nifedipine) .... One tab by mouth once daily 8)  Ibu 800 Mg Tabs (Ibuprofen) .... Take 1 tablet by mouth two times a day as needed 9)  Astepro 137 Mcg/spray Soln (Azelastine hcl) .... 2 puffs per nostril twice daily 10)  Vigamox 0.5 % Soln (Moxifloxacin hcl) .... Instill  one drop into both eyes once daily 11)  Septra Ds 800-160 Mg Tabs (Sulfamethoxazole-trimethoprim) .... Take 1 tablet by mouth two times a day 12)  Singulair 10 Mg Tabs (Montelukast sodium) .... Take 1 tablet by mouth once a day 13)  Janumet 50-1000 Mg Tabs (Sitagliptin-metformin hcl) .... Take 1 tablet by mouth two times a day  Other Orders: T-CBC w/Diff ([id number])  Patient Instructions: 1)  Please schedule a follow-up appointment in 2 months. 2)  CBC w/ Diff prior to visit, ICD-9: 3)  Urine Microalbumin prior to visit, ICD-9:  today 4)  It is important that you exercise regularly at least 20 minutes 5 times a week. If you develop chest pain, have severe difficulty breathing, or feel very tired , stop exercising immediately and seek medical attention. 5)  You need to lose weight. Consider a lower calorie diet and regular exercise.  6)  sTOP the actoplusmet you will take a new prescription Janumet 50/1000 0ne twice daily. 7)  Start singulair 1 daily for allergies and continue the other meds. 8)  you will be prescriped a3 week course of septra for your sinuses..  Prescriptions: JANUMET 50-1000 MG TABS (SITAGLIPTIN-METFORMIN HCL) Take 1 tablet by mouth  two times a day  #60 x 4   Entered and Authorized by:   Syliva Overman MD   Signed by:   Syliva Overman MD on 07/01/2008   Method used:   [facility]        [facility] [phone]* (retail)       [address]       [place], [place]  [address]       Ph: [phone] or [phone]       Fax: [phone]   RxID:   [id number] SINGULAIR 10 MG TABS (MONTELUKAST SODIUM) Take 1 tablet by mouth once a day  #30 x 3   Entered and Authorized by:   Syliva Overman MD   Signed by:   Syliva Overman MD on 07/01/2008   Method used:   [facility]ically to        [facility] [phone]* (retail)       [address]       [place], [place]  [address]       Ph: [phone] or [phone]       Fax: [phone]   RxID:   [id number] SEPTRA DS 800-160 MG TABS (SULFAMETHOXAZOLE-TRIMETHOPRIM) Take 1 tablet by mouth two times a day  #42 x 0   Entered and Authorized by:   Syliva Overman MD   Signed by:   Syliva Overman MD on 07/01/2008   Method used:   [facility]ronically to        [facility] [phone]* (retail)       [address]       [place], [place]  [address]       Ph: [phone] or [phone]       Fax: [phone]   RxID:   [id number]   Laboratory Results   Blood Tests   Date/Time Received: July 01, 2008  Date/Time Reported: July 01, 2008 10am  Glucose (fasting): 97 mg/dL   (Normal Range: 77-824) HGBA1C: 7.0%   (Normal Range: Non-Diabetic - 3-6%   Control Diabetic - 6-8%)

## 2010-04-11 NOTE — Assessment & Plan Note (Signed)
Summary: office visit   Vital Signs:  Patient profile:   55 year old female Menstrual status:  perimenopausal Height:      61.5 inches Weight:      207.75 pounds BMI:     38.76 O2 Sat:      96 % Pulse rate:   87 / minute Pulse rhythm:   regular Resp:     16 per minute BP sitting:   128 / 84  (left arm) Cuff size:   large  Vitals Entered By: Everitt Amber LPN (September 19, 2009 8:48 AM)  Nutrition Counseling: Patient's BMI is greater than 25 and therefore counseled on weight management options. CC: Follow up chronic problems   Primary Care Provider:  Johnisha Louks  CC:  Follow up chronic problems.  History of Present Illness: Pt comes in for review of uncontrolled HTN and other chronic problems. she denies any adverse side effects from her meds, specifically light headedness, cramps.or leg swelling. She c/o increase stabbing pain in her left mid thigh whioch causes her to stop in her tracks at times. This is becoming more frequent and severe and occurs on a daily basis. She also experiences low back pai with burning in the left buttock and left groin , also tingling and burning around the left knee and ankle. She has recently been to her neurologist, but before these symptoms started, I have advise she mention them to him also and still believe that an mRI of her low back will be useful. Reports  that she has otherwise been well. Denies recent fever or chills. Denies sinus pressure, nasal congestion , ear pain or sore throat. Denies chest congestion, or cough productive of sputum. Denies chest pain, palpitations, PND, orthopnea or leg swelling. Denies abdominal pain, nausea, vomitting, diarrhea or constipation. Denies change in bowel movements or bloody stool. Denies dysuria , frequency, incontinence or hesitancy.  Denies headaches, vertigo, seizures. Denies depression, anxiety or insomnia. Denies  rash, lesions, or itch.     Current Medications (verified): 1)  Glipizide 10 Mg   Tb24 (Glipizide) .... One Tab By Mouth Two Times A Day 2)  Allegra 180 Mg  Tabs (Fexofenadine Hcl) .... One Tab By Mouth Once Daily 3)  Benazepril-Hydrochlorothiazide 20-12.5 Mg  Tabs (Benazepril-Hydrochlorothiazide) .... Two Tab By Mouth Once Daily 4)  Patanase 0.6 %  Soln (Olopatadine Hcl) .Marland Kitchen.. 1-2 Sprays Each Nostril Two Times A Day As Needed 5)  Cpap 8 [facility] 6)  Nifedipine 90 Mg Xr24h-Tab (Nifedipine) .... One Tab By Mouth Once Daily 7)  Astepro 137 Mcg/spray Soln (Azelastine Hcl) .... 2 Puffs Per Nostril Twice Daily 8)  Singulair 10 Mg Tabs (Montelukast Sodium) .... Take 1 Tablet By Mouth Once A Day 9)  Multivitamins  Tabs (Multiple Vitamin) .... Once Daily 10)  Onetouch Ultra Test  Strp (Glucose Blood) .... Test One Time Daily. 11)  Janumet 50-1000 Mg Tabs (Sitagliptin-Metformin Hcl) .... Take 1 Tablet By Mouth Two Times A Day 12)  Lancets and Strips .... Two Times A Day Testing For Relion Meter 13)  Vitamin D .... Once Daily 14)  Cyclobenzaprine Hcl 10 Mg Tabs (Cyclobenzaprine Hcl) .... Take 1 Tab By Mouth At Bedtime As Needed 15)  Tramadol Hcl 50 Mg Tabs (Tramadol Hcl) .... One Tablet Twice Daily As Needed For Pain  Allergies (verified): 1)  ! Percocet 2)  ! Pcn 3)  ! Asa  Review of Systems      See HPI General:  Complains of fatigue and malaise;  denies chills and fever. Eyes:  Denies discharge, eye pain, and red eye. MS:  Complains of muscle weakness; left thigh pain stabbing and grabbing up to a 10, no aggravating factor, feels as though she will fall with the pain wonders if coming fall last fall in the tub, 3 times daily every day , abnormal sensations above left knee and ankle intermittently, symptoms , initially was in the groin and  buttock. Endo:  Complains of excessive thirst and excessive urination; tests 4 times weekly, fasting 130 to 140. Allergy:  Complains of seasonal allergies; denies hives or rash and itching eyes.  Physical Exam  General:   Well-developed, obese,in no acute distress; alert,appropriate and cooperative throughout examination. HEENT: No facial asymmetry,  EOMI, No sinus tenderness, TM's Clear, oropharynx  pink and moist. No jVD, no lymphadenopathy.  Chest: Clear to auscultation bilaterally.  CVS: S1, S2, No murmurs, No S3.   Abd: Soft, Nontender.  MS: Adequate ROM spine, , shoulders and knees.Decreased in hips, right greater than left  Ext: No edema.   CNS: CN 2-12 intact, decreased power and sensation in left thigh, otherwise nl throughout.   Skin: Intact, no visible lesions or rashes.  Psych: Good eye contact, normal affect.  Memory intact, not anxious or depressed appearing.   Diabetes Management Exam:    Foot Exam (with socks and/or shoes not present):       Sensory-Monofilament:          Left foot: diminished          Right foot: diminished       Inspection:          Left foot: normal          Right foot: normal       Nails:          Left foot: thickened          Right foot: thickened   Impression & Recommendations:  Problem # 1:  BACK PAIN WITH RADICULOPATHY (ICD-729.2) Assessment Deteriorated  Orders: Radiology Referral (Radiology)  Problem # 2:  OBESITY (ICD-278.00) Assessment: Improved  Ht: 61.5 (09/19/2009)   Wt: 207.75 (09/19/2009)   BMI: 38.76 (09/19/2009)  Problem # 3:  MULTIPLE SCLEROSIS (ICD-340) Assessment: Comment Only has upcoming neurology f/u which i encourage jher strongly to keep, esp in light of new symptoms  Problem # 4:  HYPERTENSION (ICD-401.9) Assessment: Improved  Her updated medication list for this problem includes:    Benazepril-hydrochlorothiazide 20-12.5 Mg Tabs (Benazepril-hydrochlorothiazide) .Marland Kitchen..Marland Kitchen Two tab by mouth once daily    Nifedipine 90 Mg Xr24h-tab (Nifedipine) ..... One tab by mouth once daily  Orders: T-Basic Metabolic Panel [id number])  BP today: 128/84 Prior BP: 182/86 (08/01/2009)  Labs Reviewed: K+: 4.1 (08/02/2009) Creat: : 0.67  (08/02/2009)   Chol: 171 (01/10/2009)   HDL: 57 (01/10/2009)   LDL: 97 (01/10/2009)   TG: 83 (01/10/2009)  Problem # 5:  ALLERGIC RHINITIS (ICD-477.9) Assessment: Improved  The following medications were removed from the medication list:    Benadryl 25 Mg Caps (Diphenhydramine hcl) .Marland Kitchen... As needed Her updated medication list for this problem includes:    Allegra 180 Mg Tabs (Fexofenadine hcl) ..... One tab by mouth once daily    Patanase 0.6 % Soln (Olopatadine hcl) .Marland Kitchen... 1-2 sprays each nostril two times a day as needed    Astepro 137 Mcg/spray Soln (Azelastine hcl) .Marland Kitchen... 2 puffs per nostril twice daily  Complete Medication List: 1)  Glipizide 10 Mg Tb24 (Glipizide) .... One tab  by mouth two times a day 2)  Allegra 180 Mg Tabs (Fexofenadine hcl) .... One tab by mouth once daily 3)  Benazepril-hydrochlorothiazide 20-12.5 Mg Tabs (Benazepril-hydrochlorothiazide) .... Two tab by mouth once daily 4)  Patanase 0.6 % Soln (Olopatadine hcl) .Marland Kitchen.. 1-2 sprays each nostril two times a day as needed 5)  Cpap 8 [facility]  6)  Nifedipine 90 Mg Xr24h-tab (Nifedipine) .... One tab by mouth once daily 7)  Astepro 137 Mcg/spray Soln (Azelastine hcl) .... 2 puffs per nostril twice daily 8)  Singulair 10 Mg Tabs (Montelukast sodium) .... Take 1 tablet by mouth once a day 9)  Multivitamins Tabs (Multiple vitamin) .... Once daily 10)  Onetouch Ultra Test Strp (Glucose blood) .... Test one time daily. 11)  Janumet 50-1000 Mg Tabs (Sitagliptin-metformin hcl) .... Take 1 tablet by mouth two times a day 12)  Lancets and Strips  .... Two times a day testing for relion meter 13)  Vitamin D  .... Once daily 14)  Cyclobenzaprine Hcl 10 Mg Tabs (Cyclobenzaprine hcl) .... Take 1 tab by mouth at bedtime as needed 15)  Tramadol Hcl 50 Mg Tabs (Tramadol hcl) .... One tablet twice daily as needed for pain  Other Orders: T- Hemoglobin A1C ([id number])  Patient Instructions: 1)  f/u in mid september 2)   BMP prior to visit, ICD-9:   mid Sept, not fasting 3)  HbgA1C prior to visit, ICD-9: 4)  iwill refer you fro an MRI of your low back 5)  your bP is great ,no med change. 6)  congrats on weight loss 7)  It is important that you exercise regularly at least 20 minutes 5 times a week. If you develop chest pain, have severe difficulty breathing, or feel very tired , stop exercising immediately and seek medical attention. 8)  You need to lose weight. Consider a lower calorie diet and regular exercise.  Prescriptions: TRAMADOL HCL 50 MG TABS (TRAMADOL HCL) one tablet twice daily as needed for pain  #60 x 0   Entered by:   Everitt Amber LPN   Authorized by:   Syliva Overman MD   Signed by:   Everitt Amber LPN on 34/74/2595   Method used:   Electronically to        [facility] [phone]* (retail)       [address]       [place], [place]  [address]       Ph: [phone] or [phone]       Fax: [phone]   RxID:   [id number] CYCLOBENZAPRINE HCL 10 MG TABS (CYCLOBENZAPRINE HCL) Take 1 tab by mouth at bedtime as needed  #30 x 0   Entered by:   Everitt Amber LPN   Authorized by:   Syliva Overman MD   Signed by:   Everitt Amber LPN on 17/61/6073   Method used:   Electronically to        [facility] [phone]* (retail)       [address]       [place], [place]  [address]       Ph: [phone] or [phone]       Fax: [phone]   RxID:   [id number] SINGULAIR 10 MG TABS (MONTELUKAST SODIUM) Take 1 tablet by mouth once a day  #30 x 3   Entered by:   Everitt Amber LPN   Authorized by:  Syliva Overman MD   Signed by:   Everitt Amber LPN on 95/62/1308   Method used:   Electronically to        [facility]. [phone]* (retail)       [address]       [place], [place]  [address]       Ph: [phone] or [phone]       Fax: [phone]   RxID:   [id number] BENAZEPRIL-HYDROCHLOROTHIAZIDE 20-12.5 MG  TABS  (BENAZEPRIL-HYDROCHLOROTHIAZIDE) two tab by mouth once daily  #60 Tablet x 4   Entered by:   Everitt Amber LPN   Authorized by:   Syliva Overman MD   Signed by:   Everitt Amber LPN on 41/66/0630   Method used:   Electronically to        [facility] [phone]* (retail)       [address]       [place], [place]  [address]       Ph: [phone] or [phone]       Fax: [phone]   RxID:   [id number] ALLEGRA 180 MG  TABS (FEXOFENADINE HCL) one tab by mouth once daily  #30 x 4   Entered by:   Everitt Amber LPN   Authorized by:   Syliva Overman MD   Signed by:   Everitt Amber LPN on 35/00/9381   Method used:   Electronically to        [facility] [phone]* (retail)       [address]       [place], [place]  [address]       Ph: [phone] or [phone]       Fax: [phone]   RxID:   [id number] GLIPIZIDE 10 MG  TB24 (GLIPIZIDE) one tab by mouth two times a day  #60 Tablet x 4   Entered by:   Everitt Amber LPN   Authorized by:   Syliva Overman MD   Signed by:   Everitt Amber LPN on 32/67/1245   Method used:   Electronically to        [facility] [phone]* (retail)       [address]       [place], [place]  [address]       Ph: [phone] or [phone]       Fax: [phone]   RxID:   [id number]

## 2010-04-11 NOTE — Assessment & Plan Note (Signed)
Summary: FLU SHOT  Nurse Visit   Vitals Entered By: Everitt Amber (December 29, 2008 10:24 AM)  Allergies: 1)  ! Percocet 2)  ! Pcn 3)  ! Asa  Orders Added: 1)  Influenza Vaccine NON MCR [00028]   Influenza Vaccine    Vaccine Type: Fluvax Non-MCR    Site: left deltoid    Mfr: novartis    Dose: 0.5 ml    Route: IM    Given by: Everitt Amber    Exp. Date: 06/2009    Lot #: [id number]

## 2010-04-11 NOTE — Consult Note (Signed)
Summary: Consultation Report/[facility]  Consultation Report/[facility]   Imported By: Lester Evans 08/13/2007 08:33:13  _____________________________________________________________________  External Attachment:    Type:   Image     Comment:   External Document

## 2010-04-11 NOTE — Assessment & Plan Note (Signed)
Summary: SLEEP APNEA/ MBW   Visit Type:  Initial Consult Referred by:  M. Simpson PCP:  Lodema Hong  Chief Complaint:  unable to use sleep apnea machine-Pt c/o stops breathing while sleeping.  History of Present Illness: Current Problems: Consult for Dr. Lodema Hong  CC stop  breathing in sleep also c/o allergies  55 yo woman whose husband tells her she stops breathing in her sleep, snores loudly. She has to sleep up on extra pillows to feel comfortable, partly because of nasal congestion. Bed time 11 PM to 1:00 AM with variable sleep latency. Wakes up to twice, bathroom. Usually up at 6:30 AM. Had a sleep study at [facility] 2 years ago, diagnosed with sleep apnea and got CPAP but couldn't keep it on comfortably, didn't follow-up and stopped using CPAP. Not told that she exhibits other unusual sleep behaviors. Tired in the daytime. Legs crawl at bedtime. Wakes choking or gasping.Sleep not refreshing .Daytime naps. Weight has varied on steroids given for knee pain and chronic sinus congestion. Had a CT scan of sinuses negative for nasal polyps by her report. No history of ENT surgery or of thyroid disease. Nasal congestion is a major complaint. She had seen Dr. Katrinka Blazing in [place] and Dr. Willa Rough in [place] with skin testing positive for mold and dust mite. She says allergy vaccine was never discussed as an option and she isn't seeing those doctors any longer. Has tried several eye dropsw and nasal sprays. Environmental exposures were reviewed. The home had a water leak long ago, but she says that was repaired and there is no obvious mildew or musty odor noted in the home now. No pets. Lives with smokers.     Current Allergies: ! PERCOCET ! PCN  Past Medical History:    Reviewed history and no changes required:       Allergic Rhinitis       Diabetes, Type 2       Hypertension       Sleep Apnea       MS  Past Surgical History:    Reviewed history and no changes required:       left rotator  cuff repair       Right knee repair       C-section x 2       Hernia repair       R wrist   Family History:    Reviewed history and no changes required:       alleries       Asthma       Aunt with cancer       Mother has OSA  Social History:    Reviewed history and no changes required:       Patient never smoked.        Positive history of passive tobacco smoke exposure- lives with smokers.    Risk Factors:  Tobacco use:  never Passive smoke exposure:  yes   Review of Systems       Indigestion Denies wheezing, use of or need for inhaler but intolerant of strong odors that bother nose and chest.   Vital Signs:  Patient Profile:   55 Years Old Female Weight:      211 pounds O2 Sat:      99 % O2 treatment:    Room Air Pulse rate:   78 / minute BP sitting:   162 / 90  (right arm)  Vitals Entered By: Darra Lis RMA (April 18, 2007  10:16 AM)             Is Patient Diabetic? Yes  Comments Medications reviewed with patient ..................................................................Marland KitchenDarra Lis RMA  April 18, 2007 10:29 AM      Physical Exam  General:     obese.  alert, looks uncomfortable Eyes:     periorbital edema overflow lacrimation, with tears running down her cheeks Ears:     TMs intact and clear with normal canals Nose:     Turbiates edematous with pale mucosa, thin mucus, no visible polyps Mouth:     Melampatti Class IV.   Neck:     no masses, thyromegaly, or abnormal cervical nodes no JVD.Voice normal  Lungs:     clear bilaterally to auscultation and percussion Heart:     regular rate and rhythm, S1, S2 without murmurs, rubs, gallops, or clicks Neurologic:     Grossly normal while sitting in exam room     Impression & Recommendations:  Problem # 1:  ? of SLEEP APNEA (ICD-780.57) History srongly consistent. Don't have original NPSG. Preliminary discussion done.Will schedule NPSG split at [facility], then work with cpap  if confirmed. She used [employer] before. Majpor limiting issue is her nasal congestion but weight loss would also help.  Orders: Consultation Level IV (81191) Sleep Disorder Referral (Sleep Disorder)   Problem # 2:  ALLERGIC RHINITIS (ICD-477.9) Not sure how much is atopic vs irritant, but clearly nasal congestion is a problem and will affect her use of cpap if needed. Will give sample Astepro for trial. there may also be mechanical obstruction and possibly lacrimal duct insufficiency. Pror steroids may have helped.  The following medications were removed from the medication list:    Flonase 50 Mcg/act Susp (Fluticasone propionate) ..... One puff each nostril once daily  Her updated medication list for this problem includes:    Rhinocort Aqua 32 Mcg/act Susp (Budesonide (nasal)) ..... One spray each nostril once daily    Allegra 180 Mg Tabs (Fexofenadine hcl) ..... One tab by mouth once daily    Astepro 137 Mcg/spray Soln (Azelastine hcl) ..... Use as directed    Fexofenadine Hcl 180 Mg Tabs (Fexofenadine hcl) .Marland Kitchen... Take 1 tablet by mouth once a day  Orders: Consultation Level IV (47829) TLB-CBC Platelet - w/Differential (85025-CBCD) T-"RAST" (Allergy Full Profile) IGE ([id number]) "RAST" (Allergy Full Profile) IGE ([id number]) "RAST" (Allergy Full Profile) IGE ([id number]) "RAST" (Allergy Full Profile) IGE ([id number]) "RAST" (Allergy Full Profile) IGE ([id number]) "RAST" (Allergy Full Profile) IGE ([id number]) "RAST" (Allergy Full Profile) IGE ([id number]) "RAST" (Allergy Full Profile) IGE ([id number]) "RAST" (Allergy Full Profile) IGE ([id number]) "RAST" (Allergy Full Profile) IGE ([id number]) "RAST" (Allergy Full Profile) IGE ([id number])   Medications Added to Medication List This Visit: 1)  Astepro 137 Mcg/spray Soln (Azelastine hcl) .... Use as directed 2)  Fexofenadine Hcl 180 Mg Tabs (Fexofenadine hcl) .... Take 1 tablet by mouth once a day 3)   Pataday 0.2 % Soln (Olopatadine hcl) .... Use as directed 4)  Zegerid 40-1100 Mg Caps (Omeprazole-sodium bicarbonate) .... Take 1 tablet by mouth once a day 5)  Alahist Dm 7.07-13-13 Mg/64ml Liqd (Phenylephrine-bromphen-dm) .... Use as directed 6)  Doxycycline Hyclate 100 Mg Cpep (Doxycycline hyclate) .... Take 1 tablet by mouth twice a day 7)  Levaquin 500 Mg Tabs (Levofloxacin) .... Take 1 tablet by mouth once a day   Patient Instructions: 1)  Please schedule a follow-up appointment in 1 month. 2)  [facility] will help schedule a sleep study at  [facility] for my reading. 3)  Try sample Astepro nasal spray, 1 puff each nostril twice daily as needed.    ]

## 2010-04-11 NOTE — Assessment & Plan Note (Signed)
Summary: FU/LABS PRIOR TO APPT/RMR ONLY/MORNING APPT ONLY/SS   Visit Type:  Follow-up Visit Primary Care Provider:  simpson  Chief Complaint:  follow up- doing better.  History of Present Illness: 55 year old lady with diabetes and GERD. History of iron deficiency anemia. History of gastroparesis.  Patient states her bloating and reflux are much much better. She takes Dexilant only sporadically when needed. She is watching her diet and trying  to eat multiple small meals daily. She's not been able to loose weight, however. Heavy menstrual flow has tapered off as well. Her mild transaminitis the fatty-appearing liver on prior imaging. Hepatitis C and B serologies negative.  Overall, she said she doing very well from a GI standpoint. IDA felt to be due to menstrual losses. She is on iron supplementation. She knows that she is supposed to see the gynecologist. She is hesitant in making an appointment. Colonoscopy last year demonstrated a small adenoma.  Current Medications (verified): 1)  Glipizide 10 Mg  Tb24 (Glipizide) .... One Tab By Mouth Two Times A Day 2)  Allegra 180 Mg  Tabs (Fexofenadine Hcl) .... One Tab By Mouth Once Daily 3)  Benazepril-Hydrochlorothiazide 20-12.5 Mg  Tabs (Benazepril-Hydrochlorothiazide) .... Two Tab By Mouth Once Daily 4)  Patanase 0.6 %  Soln (Olopatadine Hcl) .Marland Kitchen.. 1-2 Sprays Each Nostril Two Times A Day As Needed 5)  Cpap 8 [facility] 6)  Nifedipine 90 Mg Xr24h-Tab (Nifedipine) .... One Tab By Mouth Once Daily 7)  Astepro 137 Mcg/spray Soln (Azelastine Hcl) .... 2 Puffs Per Nostril Twice Daily 8)  Singulair 10 Mg Tabs (Montelukast Sodium) .... Take 1 Tablet By Mouth Once A Day 9)  Benadryl 25 Mg Caps (Diphenhydramine Hcl) .... As Needed 10)  Multivitamins  Tabs (Multiple Vitamin) .... Once Daily 11)  Dexilant 60 Mg Cpdr (Dexlansoprazole) .... One By Mouth 30 Mins Before Breakfast As Needed 12)  Docusate Sodium 100 Mg Caps (Docusate Sodium) ....  Take 1 Tablet By Mouth Once A Day As Needed 13)  Onetouch Ultra Test  Strp (Glucose Blood) .... Test One Time Daily. 14)  Janumet 50-1000 Mg Tabs (Sitagliptin-Metformin Hcl) .... Take 1 Tablet By Mouth Two Times A Day 15)  Lancets and Strips .... Two Times A Day Testing For Relion Meter 16)  Vitamin D .... Once Daily  Allergies (verified): 1)  ! Percocet 2)  ! Pcn 3)  ! Jonne Ply  Past History:  Past Medical History: Last updated: 11/02/2008 Allergic Rhinitis Diabetes, Type 2 Hypertension Sleep Apnea NPSG 05/09/07 AHI 12.3, ESS 16/24 MS Gastroparesis IDA Left complex cystic adnexal mass followed by Dr. Emelda Fear  Past Surgical History: Last updated: 11/01/2008  C SECTION X 2 FOUR D&Cs LUMPECTOMY LEFT BREAST LAPAROSCOPY FOR ADHESIONS TENDINITIS SURGERY INGUINAL HERNINA REPAIR CHOLECYSTECTOMY RIGHT KNEE SURGERY LEFT SHOULDER SURGERY BLADDER TACKING PROCEDURE  Family History: Last updated: 02-08-09    Mother deceased in 2008/08/18, ventricular fibbrilation with cardiac arrest Father deceased secondary to stroke, age 72 No FH of Colon Cancer:  Social History: Last updated: 11/02/2008 Patient never smoked.  Positive history of passive tobacco smoke exposure- lives with smokers.  Married, adult children, 3, all healthy. unemployed.  Risk Factors: Smoking Status: never (04/18/2007) Passive Smoke Exposure: yes (04/18/2007)  Vital Signs:  Patient profile:   55 year old female Menstrual status:  perimenopausal Height:      61.5 inches Weight:      213 pounds BMI:     39.74 Temp:     97.7 degrees F oral Pulse  rate:   60 / minute BP sitting:   138 / 82  (left arm) Cuff size:   regular  Vitals Entered By: Hendricks Limes LPN (Jul 13, 8411 11:09 AM)  Physical Exam  General:  alert conversant no acute distress Eyes:  no scleral icterus. Lungs:  clear to auscultation Heart:  regular rate rhythm without murmur gallop rub Abdomen:  obese positive bowel sounds. No  succussion splash. Abdomen is soft and nontender without appreciable mass or organomegaly  Impression & Recommendations: Impression: 55 year old lady with obesity type 2 diabetes mellitus and nonalcoholic fatty liver disease, gastroparesis and iron deficiency anemia doing very well from a GI standpoint. History of colonic adenoma.  Recommendations: Hepatic function profile 3 months  Office visit here in one year.  Aerobic exercise 30 minutes 3 times weekly.  I recommended she continue taking Dexilant  60 mg orally daily. The benefits outweigh the risks.  Appended Document: Orders Update    Clinical Lists Changes  Problems: Added new problem of OTHER CHRONIC NONALCOHOLIC LIVER DISEASE (ICD-571.8) Added new problem of COLONIC POLYPS, HX OF (ICD-V12.72) Orders: Added new Service order of Est. Patient Level IV (24401) - Signed

## 2010-04-11 NOTE — Letter (Signed)
Summary: Recall, Labs Needed  [facility]  [address]   [place], [place] [address]   Phone: [phone]  Fax: [phone]    January 27, 2010  ZEYNA MKRTCHYAN [address] [place], [place]  [address] 1955-04-06   Dear Ms. Faerber,   Our records indicate it is time to repeat your blood work.  You can take the enclosed form to the lab on or near the date indicated.  Please make note of the new location of the lab:   [address], 2nd floor   [facility]  Our office will call you within a week to ten business days with the results.  If you do not hear from [place] in 10 business days, you should call the office.  If you have any questions regarding this, call the office at [phone], and ask for the nurse.  Labs are due on 02/23/2010.   Sincerely,    Hendricks Limes LPN  [facility] Ph: [phone]   Fax: [phone]

## 2010-04-11 NOTE — Letter (Signed)
Summary: TO DR. EYNOLDS  TO DR. EYNOLDS   Imported By: [name] 05/03/2009 16:02:37  _____________________________________________________________________  External Attachment:    Type:   Image     Comment:   External Document

## 2010-04-11 NOTE — Letter (Signed)
Summary: Recall, Labs Needed  [facility]  [address]   [place], [place] [address]   Phone: [phone]  Fax: [phone]    May 02, 2009  Michaela Peterson [address] [place], [place]  [address] [birth year]   Dear Ms. Bernstein,   Our records indicate it is time to repeat your blood work.  You can take the enclosed form to the lab on or near the date indicated.  Please make note of the new location of the lab:   [address], [address] floor   [facility]  Our office will call you within a week to ten business days with the results.  If you do not hear from [place] in 10 business days, you should call the office.  If you have any questions regarding this, call the office at [phone], and ask for the nurse.  Labs are due on 05/12/2009.   Sincerely,    Hendricks Limes LPN  [facility] Ph: [phone]   Fax: [phone]

## 2010-04-11 NOTE — Assessment & Plan Note (Signed)
Summary: office visit   Vital Signs:  Patient profile:   55 year old female Menstrual status:  perimenopausal Height:      61.5 inches Weight:      209.75 pounds BMI:     39.13 O2 Sat:      97 % Pulse rate:   59 / minute Pulse rhythm:   regular Resp:     16 per minute BP sitting:   180 / 100  (left arm) Cuff size:   large  Vitals Entered By: Everitt Amber LPN (November 28, 2009 9:24 AM)  Nutrition Counseling: Patient's BMI is greater than 25 and therefore counseled on weight management options. CC: Follow up chronic problems    Primary Care Provider:  Tywaun Hiltner  CC:  Follow up chronic problems .  History of Present Illness: Pt reports non compliance withher meds, as well as with blood sugar testing. States she "just gets tired of taking meds, and is bad".As usual I reiterate the looming disaster with this approach , adn she state sshe will change. She has nor ad her pap recurrent irreg bleeding. She saw the neurologist who is pleasantly surprised by her neurologic exam, and she remains off of med for MS.   Current Medications (verified): 1)  Glipizide 10 Mg  Tb24 (Glipizide) .... One Tab By Mouth Two Times A Day 2)  Allegra 180 Mg  Tabs (Fexofenadine Hcl) .... One Tab By Mouth Once Daily 3)  Benazepril-Hydrochlorothiazide 20-12.5 Mg  Tabs (Benazepril-Hydrochlorothiazide) .... Two Tab By Mouth Once Daily 4)  Patanase 0.6 %  Soln (Olopatadine Hcl) .Marland Kitchen.. 1-2 Sprays Each Nostril Two Times A Day As Needed 5)  Cpap 8 [facility] 6)  Nifedipine 90 Mg Xr24h-Tab (Nifedipine) .... One Tab By Mouth Once Daily 7)  Singulair 10 Mg Tabs (Montelukast Sodium) .... Take 1 Tablet By Mouth Once A Day 8)  Multivitamins  Tabs (Multiple Vitamin) .... Once Daily 9)  Onetouch Ultra Test  Strp (Glucose Blood) .... Test One Time Daily. 10)  Janumet 50-1000 Mg Tabs (Sitagliptin-Metformin Hcl) .... Take 1 Tablet By Mouth Two Times A Day 11)  Lancets and Strips .... Two Times A Day Testing For  Relion Meter 12)  Vitamin D .... Once Daily  Allergies (verified): 1)  ! Percocet 2)  ! Pcn 3)  ! Asa  Review of Systems      See HPI General:  Complains of fatigue; denies chills and fever. Eyes:  Complains of blurring and light sensitivity; pt dx with dry eyes , but non compliant with m,eds. ENT:  Denies hoarseness, nasal congestion, and sinus pressure. CV:  Denies chest pain or discomfort, difficulty breathing while lying down, palpitations, shortness of breath with exertion, and swelling of feet. Resp:  Denies cough and sputum productive. GI:  Complains of constipation; denies abdominal pain, diarrhea, nausea, and vomiting. GU:  Denies dysuria, incontinence, and urinary frequency. MS:  Complains of joint pain. Derm:  Denies itching and rash. Neuro:  MS is stable per neurologist has f/u in 6 monthss. Psych:  Denies anxiety and depression. Endo:  Denies excessive thirst and excessive urination; tests occasionally, generally fastings are in the 140's. Heme:  Denies abnormal bruising and bleeding. Allergy:  Complains of seasonal allergies.  Physical Exam  General:  Well-developed, obese,in no acute distress; alert,appropriate and cooperative throughout examination. HEENT: No facial asymmetry,  EOMI, No sinus tenderness, TM's Clear, oropharynx  pink and moist. No jVD, no lymphadenopathy.  Chest: Clear to auscultation bilaterally.  CVS: S1,  S2, No murmurs, No S3.   Abd: Soft, Nontender.  MS: Adequate ROM spine, , shoulders and knees.Decreased in hips, right greater than left  Ext: No edema.   CNS: CN 2-12 intact, decreased power and sensation in left thigh, otherwise nl throughout.   Skin: Intact, no visible lesions or rashes.  Psych: Good eye contact, normal affect.  Memory intact, not anxious or depressed appearing.   Diabetes Management Exam:    Foot Exam (with socks and/or shoes not present):       Sensory-Monofilament:          Left foot: diminished          Right foot:  diminished       Inspection:          Left foot: normal          Right foot: normal       Nails:          Left foot: normal          Right foot: normal   Impression & Recommendations:  Problem # 1:  OBESITY (ICD-278.00) Assessment Unchanged  Ht: 61.5 (11/28/2009)   Wt: 209.75 (11/28/2009)   BMI: 39.13 (11/28/2009) therapeutic lifestyle change discussed and encouraged  Problem # 2:  HYPERTENSION (ICD-401.9) Assessment: Deteriorated  Her updated medication list for this problem includes:    Benazepril-hydrochlorothiazide 20-12.5 Mg Tabs (Benazepril-hydrochlorothiazide) .Marland Kitchen..Marland Kitchen Two tab by mouth once daily    Nifedipine 90 Mg Xr24h-tab (Nifedipine) ..... One tab by mouth once daily  Orders: T-Basic Metabolic Panel [phone])  BP today: 180/100 Prior BP: 128/84 (09/19/2009)  Labs Reviewed: K+: 4.1 (08/02/2009) Creat: : 0.67 (08/02/2009)   Chol: 171 (01/10/2009)   HDL: 57 (01/10/2009)   LDL: 97 (01/10/2009)   TG: 83 (01/10/2009)  Problem # 3:  DIABETES, TYPE 2 (ICD-250.00) Assessment: Comment Only  Her updated medication list for this problem includes:    Glipizide 10 Mg Tb24 (Glipizide) ..... One tab by mouth two times a day    Benazepril-hydrochlorothiazide 20-12.5 Mg Tabs (Benazepril-hydrochlorothiazide) .Marland Kitchen..Marland Kitchen Two tab by mouth once daily    Janumet 50-1000 Mg Tabs (Sitagliptin-metformin hcl) .Marland Kitchen... Take 1 tablet by mouth two times a day Patient advised to reduce carbs and sweets, commit to regular physical activity, take meds as prescribed, test blood sugars as directed, and attempt to lose weight , to improve blood sugar control.   Orders: T- Hemoglobin A1C ([id number])  Complete Medication List: 1)  Glipizide 10 Mg Tb24 (Glipizide) .... One tab by mouth two times a day 2)  Allegra 180 Mg Tabs (Fexofenadine hcl) .... One tab by mouth once daily 3)  Benazepril-hydrochlorothiazide 20-12.5 Mg Tabs (Benazepril-hydrochlorothiazide) .... Two tab by mouth once daily 4)   Patanase 0.6 % Soln (Olopatadine hcl) .Marland Kitchen.. 1-2 sprays each nostril two times a day as needed 5)  Cpap 8 [facility]  6)  Nifedipine 90 Mg Xr24h-tab (Nifedipine) .... One tab by mouth once daily 7)  Singulair 10 Mg Tabs (Montelukast sodium) .... Take 1 tablet by mouth once a day 8)  Multivitamins Tabs (Multiple vitamin) .... Once daily 9)  Onetouch Ultra Test Strp (Glucose blood) .... Test one time daily. 10)  Janumet 50-1000 Mg Tabs (Sitagliptin-metformin hcl) .... Take 1 tablet by mouth two times a day 11)  Lancets and Strips  .... Two times a day testing for relion meter 12)  Vitamin D  .... Once daily 13)  Ibuprofen 800 Mg Tabs (Ibuprofen) .... Take 1 tablet by mouth  two times a day as needed  Other Orders: Influenza Vaccine NON MCR ([address]) T-Lipid Profile ([id number]) T-Hepatic Function [phone]) T-TSH [phone]) Ketorolac-Toradol 15mg  (M0102) Admin of Therapeutic Inj  intramuscular or subcutaneous (72536)  Patient Instructions: 1)  Please schedule a follow-up appointment in 3 months. 2)  It is important that you exercise regularly at least 40 minutes 5 times a week. If you develop chest pain, have severe difficulty breathing, or feel very tired , stop exercising immediately and seek medical attention. 3)  You need to lose weight. Consider a lower calorie diet and regular exercise.  4)  Your BP is extrem,ly high, it is VITAL that you take your meds as prescribed. 5)  You will get fasting labs today 6)  Patient advised to ensure adequate intake of fruit and vegetables daily, at least 3 servings of each, as well as water intake of at least 48 ounces daily, and regular exercise. 7)  OTC stool softeners are helpful for daily use , up to 4 daily eg colace. 8)  Fiber intake daily is needed, in the form of Bran, Shredded Wheat or senekot. 9)  Laxative every 3 days if no BM eg dulcolax, magcitrate, milk of magnesia. Prescriptions: IBUPROFEN 800 MG TABS (IBUPROFEN) Take 1  tablet by mouth two times a day as needed  #60 x 4   Entered and Authorized by:   Syliva Overman MD   Signed by:   Syliva Overman MD on 11/28/2009   Method used:   [facility]ronically to        [facility] [phone]* (retail)       [address]       [place], [place]  [address]       Ph: [phone] or [phone]       Fax: [phone]   RxID:   [id number]     Influenza Vaccine    Vaccine Type: Fluvax Non-MCR    Site: left deltoid    Mfr: novartis     Dose: 0.5 ml    Route: IM    Given by: Everitt Amber LPN    Exp. Date: 07/2010    Lot #: [id number]    Medication Administration  Injection # 1:    Medication: Ketorolac-Toradol 15mg     Diagnosis: BACK PAIN WITH RADICULOPATHY (ICD-729.2)    Route: IM    Site: LUOQ gluteus    Exp Date: 05/2011    Lot #: [id number]     Mfr: novaplus    Comments: 60mg  given     Patient tolerated injection without complications    Given by: Everitt Amber LPN (November 28, 2009 10:15 AM)  Orders Added: 1)  Influenza Vaccine NON MCR [00028] 2)  Est. Patient Level IV [99214] 3)  T- Hemoglobin A1C [83036-23375] 4)  T-Lipid Profile [80061-22930] 5)  T-Hepatic Function [80076-22960] 6)  T-Basic Metabolic Panel [[id number]] 7)  T-TSH [28315-17616] 8)  Ketorolac-Toradol 15mg  [J1885] 9)  Admin of Therapeutic Inj  intramuscular or subcutaneous [07371]

## 2010-04-11 NOTE — Progress Notes (Signed)
Summary: eye examination  eye examination   Imported By: [name] 04/21/2008 08:11:24  _____________________________________________________________________  External Attachment:    Type:   Image     Comment:   External Document

## 2010-04-11 NOTE — Progress Notes (Signed)
Summary: phone note/   Phone Note Call from Patient   Caller: Patient Summary of Call: Pt left VM that she has been unable to do stool test, because she has been on Ibuprofen for awhile and her literature advised against it while she is on the Ibuprofen. Initial call taken by: Cloria Spring LPN,  June 28, 2009 11:53 AM     Appended Document: phone note/  Make sure she is doing labs as recommended.  Appended Document: phone note/  Pt informed.

## 2010-04-11 NOTE — Progress Notes (Signed)
Summary: results from mri  Phone Note Call from Patient   Summary of Call: wants results on her mri that dr.reynolds had done but last time here dr. Lodema Hong  had received the results and she wants someone from here to call her and give her the results [phone] Initial call taken by: [name],  June 15, 2009 3:25 PM  Follow-up for Phone Call        patient aware to call dr Thad Ranger office Follow-up by: Adella Hare LPN,  June 16, 2009 10:29 AM

## 2010-04-11 NOTE — Progress Notes (Signed)
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

## 2010-04-11 NOTE — Assessment & Plan Note (Signed)
Summary: FOLLOW UP   Vital Signs:  Patient profile:   55 year old female Menstrual status:  perimenopausal Height:      61.5 inches Weight:      201 pounds BMI:     37.50 O2 Sat:      99 % Pulse rate:   66 / minute Pulse rhythm:   regular Resp:     16 per minute BP sitting:   154 / 98 Cuff size:   large  Vitals Entered By: Everitt Amber (March 02, 2009 10:18 AM)  Nutrition Counseling: Patient's BMI is greater than 25 and therefore counseled on weight management options. CC: has had a cough for over a month, everytime she sprays something with an odor it makes her cough Is Patient Diabetic? Yes   Primary Care Provider:  Syliva Overman  CC:  has had a cough for over a month and everytime she sprays something with an odor it makes her cough.  History of Present Illness: Reports  that she has been doing fairly well. Denies recent fever or chills. Denies sinus pressure, nasal congestion , ear pain or sore throat. Denies chest congestion, or cough productive of sputum.she states however that she has had uncontrolled cough for the past 1 month, often triggerd by exposure to cleaners, which she is unable to avoid. Denies chest pain, palpitations, PND, orthopnea or leg swelling. Denies abdominal pain, nausea, vomitting, diarrhea or constipation. Denies change in bowel movements or bloody stool. Denies dysuria , frequency, incontinence or hesitancy. Denies  joint pain, swelling, or reduced mobility. Denies headaches, vertigo, seizures. Denies depression, anxiety or insomnia. Denies  rash, lesions, or itch.     Current Medications (verified): 1)  Glipizide 10 Mg  Tb24 (Glipizide) .... One Tab By Mouth Two Times A Day 2)  Allegra 180 Mg  Tabs (Fexofenadine Hcl) .... One Tab By Mouth Once Daily 3)  Benazepril-Hydrochlorothiazide 20-12.5 Mg  Tabs (Benazepril-Hydrochlorothiazide) .... Two Tab By Mouth Once Daily 4)  Patanase 0.6 %  Soln (Olopatadine Hcl) .Marland Kitchen.. 1-2 Sprays Each  Nostril Two Times A Day As Needed 5)  Cpap 8 [facility] 6)  Nifedipine 90 Mg Xr24h-Tab (Nifedipine) .... One Tab By Mouth Once Daily 7)  Astepro 137 Mcg/spray Soln (Azelastine Hcl) .... 2 Puffs Per Nostril Twice Daily 8)  Singulair 10 Mg Tabs (Montelukast Sodium) .... Take 1 Tablet By Mouth Once A Day 9)  Benadryl 25 Mg Caps (Diphenhydramine Hcl) .... As Needed 10)  Multivitamins  Tabs (Multiple Vitamin) .... Once Daily 11)  Dexilant 60 Mg Cpdr (Dexlansoprazole) .... One By Mouth 30 Mins Before Breakfast 12)  Promethazine Hcl 25 Mg Tabs (Promethazine Hcl) .... 1/2 To One By Mouth Every 6 Hours As Needed N/v.  Be Aware of Sedation. 13)  Docusate Sodium 100 Mg Caps (Docusate Sodium) .... Take 1 Tablet By Mouth Once A Day As Needed 14)  Actoplus Met 15-850 Mg Tabs (Pioglitazone Hcl-Metformin Hcl) .... Take 1 Tablet By Mouth Two Times A Day 15)  Onetouch Ultra Test  Strp (Glucose Blood) .... Test One Time Daily. 16)  Tussionex Pennkinetic Er 8-10 Mg/46ml Lqcr (Chlorpheniramine-Hydrocodone) .... 5cc By Mouth Two Times A Day Prn  Allergies (verified): 1)  ! Percocet 2)  ! Pcn 3)  ! Asa  Review of Systems Eyes:  Denies blurring and discharge. ENT:  Complains of nasal congestion and postnasal drainage; denies nosebleeds and sinus pressure. Resp:  Complains of cough; denies sputum productive and wheezing. Neuro:  Denies headaches, seizures,  and sensation of room spinning; needs to resched appt with neurology regarding her MS. Endo:  Denies cold intolerance, excessive hunger, excessive thirst, excessive urination, heat intolerance, polyuria, and weight change; testing ion avg once daily , and reports improvement in her blood sugars, with fastings averaging less than 130. Heme:  Denies abnormal bruising and bleeding. Allergy:  Complains of seasonal allergies; uncontrolled allergic symptoms , often related to exposure to cleaning agents.  Physical Exam  General:  Well-developed,obese,in  no acute distress; alert,appropriate and cooperative throughout examination HEENT: No facial asymmetry,  EOMI, No sinus tenderness, TM's Clear, oropharynx  pink and moist.   Chest: Clear to auscultation bilaterally.  CVS: S1, S2, No murmurs, No S3.   Abd: Soft, Nontender.  MS: Adequate ROM spine, hips, shoulders and knees.  Ext: No edema.   CNS: CN 2-12 intact, power tone and sensation normal throughout.   Skin: Intact, no visible lesions or rashes.  Psych: Good eye contact, normal affect.  Memory intact, not anxious or depressed appearing.    Impression & Recommendations:  Problem # 1:  GERD (ICD-530.81) Assessment Improved  Her updated medication list for this problem includes:    Dexilant 60 Mg Cpdr (Dexlansoprazole) ..... One by mouth 30 mins before breakfast  Problem # 2:  OBESITY (ICD-278.00) Assessment: Improved  Ht: 61.5 (03/02/2009)   Wt: 201 (03/02/2009)   BMI: 37.50 (03/02/2009)  Problem # 3:  HYPERTENSION (ICD-401.9) Assessment: Unchanged  Her updated medication list for this problem includes:    Benazepril-hydrochlorothiazide 20-12.5 Mg Tabs (Benazepril-hydrochlorothiazide) .Marland Kitchen..Marland Kitchen Two tab by mouth once daily    Nifedipine 90 Mg Xr24h-tab (Nifedipine) ..... One tab by mouth once daily  BP today: 154/98, pt reports still being non compliant with meds, not taking on a regular basisi as prescribed Prior BP: 160/98 (01/12/2009)  Labs Reviewed: K+: 3.9 (01/10/2009) Creat: : 0.70 (01/10/2009)   Chol: 171 (01/10/2009)   HDL: 57 (01/10/2009)   LDL: 97 (01/10/2009)   TG: 83 (01/10/2009)  Problem # 4:  DIABETES, TYPE 2 (ICD-250.00) Assessment: Improved  Her updated medication list for this problem includes:    Glipizide 10 Mg Tb24 (Glipizide) ..... One tab by mouth two times a day    Benazepril-hydrochlorothiazide 20-12.5 Mg Tabs (Benazepril-hydrochlorothiazide) .Marland Kitchen..Marland Kitchen Two tab by mouth once daily    Actoplus Met 15-850 Mg Tabs (Pioglitazone hcl-metformin hcl) .Marland Kitchen... Take  1 tablet by mouth two times a day  Orders: Glucose, (CBG) [phone])  Labs Reviewed: Creat: 0.70 (01/10/2009)    Reviewed HgBA1c results: 8.0 (01/12/2009)  7.8 (10/05/2008)  Complete Medication List: 1)  Glipizide 10 Mg Tb24 (Glipizide) .... One tab by mouth two times a day 2)  Allegra 180 Mg Tabs (Fexofenadine hcl) .... One tab by mouth once daily 3)  Benazepril-hydrochlorothiazide 20-12.5 Mg Tabs (Benazepril-hydrochlorothiazide) .... Two tab by mouth once daily 4)  Patanase 0.6 % Soln (Olopatadine hcl) .Marland Kitchen.. 1-2 sprays each nostril two times a day as needed 5)  Cpap 8 [facility]  6)  Nifedipine 90 Mg Xr24h-tab (Nifedipine) .... One tab by mouth once daily 7)  Astepro 137 Mcg/spray Soln (Azelastine hcl) .... 2 puffs per nostril twice daily 8)  Singulair 10 Mg Tabs (Montelukast sodium) .... Take 1 tablet by mouth once a day 9)  Benadryl 25 Mg Caps (Diphenhydramine hcl) .... As needed 10)  Multivitamins Tabs (Multiple vitamin) .... Once daily 11)  Dexilant 60 Mg Cpdr (Dexlansoprazole) .... One by mouth 30 mins before breakfast 12)  Promethazine Hcl 25 Mg Tabs (  Promethazine hcl) .... 1/2 to one by mouth every 6 hours as needed n/v.  be aware of sedation. 13)  Docusate Sodium 100 Mg Caps (Docusate sodium) .... Take 1 tablet by mouth once a day as needed 14)  Actoplus Met 15-850 Mg Tabs (Pioglitazone hcl-metformin hcl) .... Take 1 tablet by mouth two times a day 15)  Onetouch Ultra Test Strp (Glucose blood) .... Test one time daily. 16)  Tussionex Pennkinetic Er 8-10 Mg/64ml Lqcr (Chlorpheniramine-hydrocodone) .... 5cc by mouth two times a day prn  Patient Instructions: 1)  F/U ijn mid Feb. 2)  PLS take  your  meds on a daily regular schedule as prescribed, your BP is too high, because you do not. 3)  It is important that you exercise regularly at least 20 minutes 5 times a week. If you develop chest pain, have severe difficulty breathing, or feel very tired , stop exercising  immediately and seek medical attention. 4)  You need to lose weight. Consider a lower calorie diet and regular exercise.  Prescriptions: [name] ER 8-10 MG/5ML LQCR (CHLORPHENIRAMINE-HYDROCODONE) 5cc by mouth two times a day prn  #300cc x 3   Entered by:   Worthy Keeler LPN   Authorized by:   Syliva Overman MD   Signed by:   Worthy Keeler LPN on 16/12/9602   Method used:   Printed then [facility]ed [facility] ...       [facility]  [address] [phone][facility] (retail)       [address]       [place], [place]  [address]       Ph: [phone] or [phone]       Fax: [phone]   RxID:   [id number]   Laboratory Results   Blood Tests   Date/Time Received: March 02, 2009  Date/Time Reported: March 02, 2009   Glucose (random): 91 mg/dL   (Normal Range: 74-259)

## 2010-04-11 NOTE — Assessment & Plan Note (Signed)
Summary: fu ov with LSL per RMR,abd pain/ss   Visit Type:  f/u Primary Care Provider:  Syliva Overman  Chief Complaint:  follow up- still has pain at times and depends on what she eats.  History of Present Illness:    Michaela Peterson is here for a followup visit. She has h/o abd pain, gastroparesis, GERD.  She underwent EGD/TCS/TI recently which showedWhen asked her to come back for followup abdominal pain as well as abnormal LFTs. At time of her EGD she had LFTs that showed normal esophagus, small hiatal hernia, questionable pale duodenal mucosa, status post biopsy. Colonoscopy findings:  Normal rectum, few scattered diverticula, diminutive polyp at base of ileocecal valve, remainder of the colonic mucosa and terminal mucosa appeared normal.  Duodenal bx negative.  Polyp was tubular adenoma. Abd u/s showed fatty liver.  LFTs showed  AST of 65 and ALT of 71. Previous transaminases were normal over the last couple years.   She tells me she continues to feel bloated especially if she doesn't have a regular bowel movement or doesn't exercise daily. She notes that if she drinks in Ensure instead of larger breakfast she feels better. Her reflux is generally well controlled whenever she takes Zegerid. She has trouble affording the medication. Previously she was on AcipHex with good relief but  her insurance wouldn't pay. She took omeprazole in the remote but doesn't remember significant benefit. Denies any blood in the stool or melena.      Current Medications (verified): 1)  Glipizide 10 Mg  Tb24 (Glipizide) .... One Tab By Mouth Two Times A Day 2)  Allegra 180 Mg  Tabs (Fexofenadine Hcl) .... One Tab By Mouth Once Daily 3)  Benazepril-Hydrochlorothiazide 20-12.5 Mg  Tabs (Benazepril-Hydrochlorothiazide) .... Two Tab By Mouth Once Daily 4)  Zegerid 40-1100 Mg  Caps (Omeprazole-Sodium Bicarbonate) .... Take 1 Tablet By Mouth Once A Day 5)  Patanase 0.6 %  Soln (Olopatadine Hcl) .Marland Kitchen.. 1-2 Sprays Each  Nostril Two Times A Day As Needed 6)  Cpap 8 [facility] 7)  Nifedipine 90 Mg Xr24h-Tab (Nifedipine) .... One Tab By Mouth Once Daily 8)  Astepro 137 Mcg/spray Soln (Azelastine Hcl) .... 2 Puffs Per Nostril Twice Daily 9)  Singulair 10 Mg Tabs (Montelukast Sodium) .... Take 1 Tablet By Mouth Once A Day 10)  Janumet 50-1000 Mg Tabs (Sitagliptin-Metformin Hcl) .... Take 1 Tablet By Mouth Two Times A Day 11)  Astelin 137 Mcg/spray Soln (Azelastine Hcl) .... 2 Sprays Two Times A Day As Needed 12)  Benadryl 25 Mg Caps (Diphenhydramine Hcl) .... As Needed 13)  Multivitamins  Tabs (Multiple Vitamin) .... Once Daily  Allergies (verified): 1)  ! Percocet 2)  ! Pcn 3)  ! Asa  Review of Systems      See HPI  Vital Signs:  Patient profile:   55 year old female Menstrual status:  perimenopausal Height:      61.5 inches Weight:      207 pounds BMI:     38.62 Temp:     97.8 degrees F oral Pulse rate:   64 / minute BP sitting:   138 / 100  (right arm) Cuff size:   regular  Vitals Entered By: Hendricks Limes LPN (December 29, 2008 10:48 AM)  Physical Exam  General:  Well developed, well nourished, no acute distress. Head:  Normocephalic and atraumatic. Eyes:  Scleral nonicteric. Mouth:  OP moist. Lungs:  Clear throughout to auscultation. Heart:  Regular rate and rhythm; no  murmurs, rubs,  or bruits. Abdomen:  Bowel sounds normal.  Abdomen is soft, nontender, nondistended.  No rebound or guarding.  No hepatosplenomegaly, masses or hernias.  No abdominal bruits.  Extremities:  No clubbing, cyanosis, edema or deformities noted. Neurologic:  Alert and  oriented x4;  grossly normal neurologically. Skin:  Intact without significant lesions or rashes. Psych:  Alert and cooperative. Normal mood and affect.  Impression & Recommendations:  Problem # 1:  GERD (ICD-530.81)  stop Zegerid, try Dexilant 60 mg daily. Samples provided as well as prescription and rebate card. Antireflux  measures addressed. Encouraged ongoing exercise and weight loss.  Orders: Est. Patient Level III ([address])  Problem # 2:  GASTROPARESIS (ICD-536.3)  intolerant to Reglan. Does better when she follows gastroparesis diet. Reiterated need to avoid fatty and fried foods, continue to eat multiple small meals daily, never overeat. At this time would avoid promotility agent. She asked for anti-emetics to use only when absolutely needed.  Orders: Est. Patient Level III ([address])  Problem # 3:  TRANSAMINASES, SERUM, ELEVATED (ICD-790.4) New transaminitis. Likely due to fatty liver. Encouraged weight loss and exercise. Encouraged tight controlled diabetes mellitus. Begin workup of elevated LFTs. Orders: T-Hepatic Function [phone]) T-Ferritin [phone]) T-Iron [phone]) T-Iron Binding Capacity (TIBC) ([id number]) T-Hepatitis B Surface Antigen ([id number]) T-Hepatitis C Antibody ([phone]) Est. Patient Level III ([address])  Patient Instructions: 1)  Stop Zegerid. 2)  Try Dexilant one 30 mins before breakfast.  Samples given.  [facility]  Re[facility] card given, please take to pharmacy with you. 3)  Do lab work. 4)  [facility]a [facility]. 5)  The medication list was reviewed and reconciled.  All changed / newly prescribed medications were explained.  A complete medication list was provided to the patient / caregiver. Prescriptions: PROMETHAZINE HCL 25 MG TABS (PROMETHAZINE HCL) 1/2 to one by mouth every 6 hours as needed n/v.  Be aware of sedation.  #10 x 0   Entered and Authorized by:   Leanna Battles. Dixon Boos   Signed by:   Leanna Battles Dixon Boos on 12/29/2008   Method used:   [facility] to        [facility] [phone]* (retail)       [address]       [place], [place]  [address]       Ph: [phone] or [phone]       Fax: [phone]   RxID:   [id number] DEXILANT 60 MG CPDR (DEXLANSOPRAZOLE) one by mouth 30 mins before breakfast  #30 x 5    Entered and Authorized by:   Leanna Battles. Dixon Boos   Signed by:   Leanna Battles Dixon Boos on 12/29/2008   Method used:   [facility]ically to        [facility] [phone]* (retail)       [address]       [place], [place]  [address]       Ph: [phone] or [phone]       Fax: [phone]   RxID:   [id number]   Appended Document: fu ov with LSL per RMR,abd pain/ss Please find out if she has done BW yet.  Appended Document: fu ov with LSL per RMR,abd pain/ss Pt said she will go Monday and do her labs.

## 2010-04-11 NOTE — Progress Notes (Signed)
Summary: sinus infection  Phone Note Call from Patient   Summary of Call: having sinus problems and would [phone] Initial call taken by: Rudene Anda,  September 21, 2008 8:35 AM  Follow-up for Phone Call        sneezing, sore throat, cough with clear sputum, clear nasal drainage, feels like she does have some fever Follow-up by: Worthy Keeler LPN,  September 21, 2008 8:44 AM  Additional Follow-up for Phone Call Additional follow up Details #1::        advise without established fever , yellow/gree drainage or sputum itis unlikely she actually has bac infection, I advise normal saline nasal washes 3 to  times daily, use her allergy meds, and mucinex1 twice daily for 5 days.Drink alot of fluids and tylenol for pain as needed. Call back if she develops the symptoms mentioned Additional Follow-up by: Syliva Overman MD,  September 21, 2008 12:59 PM    Additional Follow-up for Phone Call Additional follow up Details #2::    returned call, no answer Follow-up by: Worthy Keeler LPN,  September 21, 2008 2:20 PM  Additional Follow-up for Phone Call Additional follow up Details #3:: Details for Additional Follow-up Action Taken: returned call, no answer Additional Follow-up by: Worthy Keeler LPN,  September 22, 2008 9:25 AM

## 2010-04-11 NOTE — Progress Notes (Signed)
Summary: [facility]  [facility]   Imported By: [name] 04/04/2009 14:16:08  _____________________________________________________________________  External Attachment:    Type:   Image     Comment:   External Document

## 2010-04-11 NOTE — Letter (Signed)
Summary: Recall, Labs Needed  [facility]  [address]   [place], [place] [address]   Phone: [phone]  Fax: [phone]    October 05, 2009  DEVINE DANT [address] [place], [place]  [address] October 05, 1955   Dear Ms. Allensworth,   Our records indicate it is time to repeat your blood work.  You can take the enclosed form to the lab on or near the date indicated.  Please make note of the new location of the lab:   [address], [address]nd floor   [facility]  Our office will call you within a week to ten business days with the results.  If you do not hear from [place] in 10 business days, you should call the office.  If you have any questions regarding this, call the office at [phone], and ask for the nurse.  Labs are due on 10/13/2009.   Sincerely,    Hendricks Limes LPN  [facility] Ph: [phone]   Fax: [phone]

## 2010-04-11 NOTE — Progress Notes (Signed)
  Phone Note Call from Patient   Summary of Call: has a boil in her pubic area and now she has one down further and wants something for it. [facility] Initial call taken by: Everitt Amber,  April 01, 2009 10:04 AM  Follow-up for Phone Call        antibiotic sent in per verbal order from dr. Lodema Hong Follow-up by: Everitt Amber,  April 01, 2009 10:45 AM    New/Updated Medications: DOXYCYCLINE HYCLATE 100 MG CAPS (DOXYCYCLINE HYCLATE) Take 1 tablet by mouth two times a day Prescriptions: DOXYCYCLINE HYCLATE 100 MG CAPS (DOXYCYCLINE HYCLATE) Take 1 tablet by mouth two times a day  #14 x 0   Entered by:   Everitt Amber   Authorized by:   Syliva Overman MD   Signed by:   Everitt Amber on 04/01/2009   Method used:   Electronically to        [facility] [phone][facility] (retail)       [address]       [place], [place]  [address]       Ph: [phone] or [phone]       Fax: [phone]   RxID:   [id number]

## 2010-04-11 NOTE — Assessment & Plan Note (Signed)
Summary: F UP   Vital Signs:  Patient profile:   55 year old female Menstrual status:  perimenopausal Height:      61.5 inches Weight:      210 pounds BMI:     39.18 O2 Sat:      97 % Pulse rate:   65 / minute Pulse rhythm:   regular Resp:     16 per minute BP sitting:   182 / 86  (left arm) Cuff size:   large  Vitals Entered By: Everitt Amber LPN (Aug 01, 2009 9:18 AM)  Nutrition Counseling: Patient's BMI is greater than 25 and therefore counseled on weight management options. CC: Follow up chronic problems, some left leg pain   Primary Care Provider:  Ivanell Deshotel  CC:  Follow up chronic problems and some left leg pain.  History of Present Illness: Pt reports that hse was out of her BP med fpr approx 2 weeks, siting trans[pt as the problem, she did fill it this past weekend but did not take it yesterday since she was going to [place]. She states it causes her to urinat often , eso when she just resumes talking it, the option of changing  the med was brought up, however, she wants to give her current meds more of a chance. denies any recent fever or chills, reports regularly testing at least once daily, and reports some hypoglycemia into the 50's , as a result she seldom take her glipizide as prescribed. She denies any recent fever or chills , head or chest congerstion.   Current Medications (verified): 1)  Glipizide 10 Mg  Tb24 (Glipizide) .... One Tab By Mouth Two Times A Day 2)  Allegra 180 Mg  Tabs (Fexofenadine Hcl) .... One Tab By Mouth Once Daily 3)  Benazepril-Hydrochlorothiazide 20-12.5 Mg  Tabs (Benazepril-Hydrochlorothiazide) .... Two Tab By Mouth Once Daily 4)  Patanase 0.6 %  Soln (Olopatadine Hcl) .Marland Kitchen.. 1-2 Sprays Each Nostril Two Times A Day As Needed 5)  Cpap 8 [facility] 6)  Nifedipine 90 Mg Xr24h-Tab (Nifedipine) .... One Tab By Mouth Once Daily 7)  Astepro 137 Mcg/spray Soln (Azelastine Hcl) .... 2 Puffs Per Nostril Twice Daily 8)  Singulair 10 Mg Tabs  (Montelukast Sodium) .... Take 1 Tablet By Mouth Once A Day 9)  Benadryl 25 Mg Caps (Diphenhydramine Hcl) .... As Needed 10)  Multivitamins  Tabs (Multiple Vitamin) .... Once Daily 11)  Dexilant 60 Mg Cpdr (Dexlansoprazole) .... One By Mouth 30 Mins Before Breakfast As Needed 12)  Docusate Sodium 100 Mg Caps (Docusate Sodium) .... Take 1 Tablet By Mouth Once A Day As Needed 13)  Onetouch Ultra Test  Strp (Glucose Blood) .... Test One Time Daily. 14)  Janumet 50-1000 Mg Tabs (Sitagliptin-Metformin Hcl) .... Take 1 Tablet By Mouth Two Times A Day 15)  Lancets and Strips .... Two Times A Day Testing For Relion Meter 16)  Vitamin D .... Once Daily  Allergies (verified): 1)  ! Percocet 2)  ! Pcn 3)  ! Asa  Review of Systems      See HPI General:  Complains of fatigue. Eyes:  Denies blurring and discharge; needs eye exam. ENT:  Denies hoarseness, nasal congestion, and sinus pressure. CV:  Denies chest pain or discomfort, difficulty breathing while lying down, palpitations, and swelling of feet. Resp:  Denies cough, sputum productive, and wheezing. GI:  Denies abdominal pain, constipation, diarrhea, nausea, and vomiting. GU:  Denies dysuria and urinary frequency. MS:  Complains of joint  pain and stiffness; 4 nights  ago she experienced sharp pain in the left calf, medial aspect , first episode,no provocation, lasted approx  3 days was still sore, at it's worst irt was  an 8, she also reports that intermittently she has had a sharp pain from the left buttock to groin intermittently since she did a split in her tub last Sept, and the joint feels as if it will give out, nearly fell [employer] yesterday as a result, also c/o left shoulder pain x 2 weeks. Derm:  Denies itching, lesion(s), and rash. Neuro:  Denies falling down, inability to speak, poor balance, seizures, and sensation of room spinning. Psych:  Denies anxiety and depression. Endo:  Denies excessive thirst and excessive urination; tests  twice daily on avg 5 days per week, mornings range between 90 to 133, bedtime range 139 to 159. Rarely up to 200, have had lows into the 40's and does not always take the glipizide. Heme:  Denies abnormal bruising and bleeding. Allergy:  Complains of seasonal allergies.  Physical Exam  General:  Well-developed, obese,in no acute distress; alert,appropriate and cooperative throughout examination. HEENT: No facial asymmetry,  EOMI, No sinus tenderness, TM's Clear, oropharynx  pink and moist. No jVD, no lymphadenopathy.  Chest: Clear to auscultation bilaterally.  CVS: S1, S2, No murmurs, No S3.   Abd: Soft, Nontender.  MS: Adequate ROM spine, , shoulders and knees.Decreased in hips, right greater than left  Ext: No edema.   CNS: CN 2-12 intact, power and sensation normal in left thigh, otherwise nl .  Skin: Intact, no visible lesions or rashes.  Psych: Good eye contact, normal affect.  Memory intact, not anxious or depressed appearing.   Diabetes Management Exam:    Foot Exam (with socks and/or shoes not present):       Sensory-Monofilament:          Left foot: normal          Right foot: normal       Inspection:          Left foot: normal          Right foot: normal       Nails:          Left foot: normal          Right foot: normal   Impression & Recommendations:  Problem # 1:  BACK PAIN WITH RADICULOPATHY (ICD-729.2) Assessment Deteriorated  Orders: Radiology Referral (Radiology)  Problem # 2:  HIP PAIN, LEFT (ZOX-096.04) Assessment: Deteriorated  Orders: Radiology Referral (Radiology)  Problem # 3:  OBESITY (ICD-278.00) Assessment: Unchanged  Ht: 61.5 (08/01/2009)   Wt: 210 (08/01/2009)   BMI: 39.18 (08/01/2009)  Problem # 4:  HYPERTENSION (ICD-401.9) Assessment: Deteriorated  Her updated medication list for this problem includes:    Benazepril-hydrochlorothiazide 20-12.5 Mg Tabs (Benazepril-hydrochlorothiazide) .Marland Kitchen..Marland Kitchen Two tab by mouth once daily    Nifedipine  90 Mg Xr24h-tab (Nifedipine) ..... One tab by mouth once daily  Orders: T-Basic Metabolic Panel [id number])  BP today: 182/86 Prior BP: 138/82 (07/12/2009)  Labs Reviewed: K+: 3.9 (01/10/2009) Creat: : 0.70 (01/10/2009)   Chol: 171 (01/10/2009)   HDL: 57 (01/10/2009)   LDL: 97 (01/10/2009)   TG: 83 (01/10/2009)  Problem # 5:  DIABETES, TYPE 2 (ICD-250.00) Assessment: Comment Only  Her updated medication list for this problem includes:    Glipizide 10 Mg Tb24 (Glipizide) ..... One tab by mouth two times a day    Benazepril-hydrochlorothiazide 20-12.5 Mg Tabs (  Benazepril-hydrochlorothiazide) .Marland Kitchen..Marland Kitchen Two tab by mouth once daily    Janumet 50-1000 Mg Tabs (Sitagliptin-metformin hcl) .Marland Kitchen... Take 1 tablet by mouth two times a day  Orders: T-Urine Microalbumin w/creat. ratio [id number]) T- Hemoglobin A1C ([id number])Future Orders: Ophthalmology Referral (Ophthalmology) ... 08/18/2009  Labs Reviewed: Creat: 0.70 (01/10/2009)    Reviewed HgBA1c results: 8.0 (01/12/2009)  7.8 (10/05/2008)  Complete Medication List: 1)  Glipizide 10 Mg Tb24 (Glipizide) .... One tab by mouth two times a day 2)  Allegra 180 Mg Tabs (Fexofenadine hcl) .... One tab by mouth once daily 3)  Benazepril-hydrochlorothiazide 20-12.5 Mg Tabs (Benazepril-hydrochlorothiazide) .... Two tab by mouth once daily 4)  Patanase 0.6 % Soln (Olopatadine hcl) .Marland Kitchen.. 1-2 sprays each nostril two times a day as needed 5)  Cpap 8 [facility]  6)  Nifedipine 90 Mg Xr24h-tab (Nifedipine) .... One tab by mouth once daily 7)  Astepro 137 Mcg/spray Soln (Azelastine hcl) .... 2 puffs per nostril twice daily 8)  Singulair 10 Mg Tabs (Montelukast sodium) .... Take 1 tablet by mouth once a day 9)  Benadryl 25 Mg Caps (Diphenhydramine hcl) .... As needed 10)  Multivitamins Tabs (Multiple vitamin) .... Once daily 11)  Dexilant 60 Mg Cpdr (Dexlansoprazole) .... One by mouth 30 mins before breakfast as needed 12)  Docusate  Sodium 100 Mg Caps (Docusate sodium) .... Take 1 tablet by mouth once a day as needed 13)  Onetouch Ultra Test Strp (Glucose blood) .... Test one time daily. 14)  Janumet 50-1000 Mg Tabs (Sitagliptin-metformin hcl) .... Take 1 tablet by mouth two times a day 15)  Lancets and Strips  .... Two times a day testing for relion meter 16)  Vitamin D  .... Once daily  Other Orders: T-Vitamin D (25-Hydroxy) [id number])  Patient Instructions: 1)  f/u in 6 week' 2)  it is vital you take bP meds daily as prescribed.This will reduce the risk of stroke, heart disease, loss of vision and kidney failure. 3)  Check your blood sugars regularly. If your readings are usually above : or below 70 you should contact our office. 4)  Urine Microalbumin prior to visit, ICD-9:  today 5)  vitamin d chem7, hBa1c 6)  you are referred for eye exam,, also for tests regarding back pain aND LEFT LEG WEAKNESS

## 2010-04-11 NOTE — Progress Notes (Signed)
Summary: CALL BACK  Phone Note Call from Patient   Summary of Call: Michaela Peterson TO CALL HER AT [phone] Initial call taken by: [name],  August 25, 2009 3:18 PM  Follow-up for Phone Call        MRI denied by insurance  wants to know what should she take? what is plan of action for her pain? Follow-up by: Adella Hare LPN,  August 25, 2009 4:07 PM  Additional Follow-up for Phone Call Additional follow up Details #1::        pls advise I have sent in a muscle relaxant and tramadol, both have a potentially sedative s/e, so take at bedtime initially, or do not plan to drive until accustomed to the med Additional Follow-up by: Syliva Overman MD,  August 25, 2009 5:35 PM    Additional Follow-up for Phone Call Additional follow up Details #2::    called patient, no answer Follow-up by: Adella Hare LPN,  August 26, 2009 10:35 AM  Additional Follow-up for Phone Call Additional follow up Details #3:: Details for Additional Follow-up Action Taken: patient aware Additional Follow-up by: Adella Hare LPN,  August 29, 2009 1:36 PM  New/Updated Medications: CYCLOBENZAPRINE HCL 10 MG TABS (CYCLOBENZAPRINE HCL) Take 1 tab by mouth at bedtime as needed TRAMADOL HCL 50 MG TABS (TRAMADOL HCL) one tablet twice daily as needed for pain Prescriptions: TRAMADOL HCL 50 MG TABS (TRAMADOL HCL) one tablet twice daily as needed for pain  #60 x 0   Entered and Authorized by:   Syliva Overman MD   Signed by:   Syliva Overman MD on 08/25/2009   Method used:   [facility]        [facility] [phone]* (retail)       [address]       [place], [place]  [address]       Ph: [phone] or [phone]       Fax: [phone]   RxID:   [id number] CYCLOBENZAPRINE HCL 10 MG TABS (CYCLOBENZAPRINE HCL) Take 1 tab by mouth at bedtime as needed  #30 x 0   Entered and Authorized by:   Syliva Overman MD   Signed by:   Syliva Overman MD on 08/25/2009   Method used:   [facility] to         [facility] [phone]* (retail)       [address]       [place], [place]  [address]       Ph: [phone] or [phone]       Fax: [phone]   RxID:   [id number]

## 2010-04-11 NOTE — Miscellaneous (Signed)
Summary: PA Dexilant  Clinical Lists Changes  t has to try & fail prilosec, prevacid and protonix prior to dexilant being covered.  Has she done this?  KJ  Appended Document: PA Dexilant pt has tried and failed prilosec and prevacid.  Appended Document: PA Dexilant Please let her know trial of protonix 40mg  daily x 1 month, 0RF  Appended Document: PA Dexilant [facility] [place]  Appended Document: PA Dexilant tried to call pt- NA  Appended Document: PA Dexilant pt aware

## 2010-04-11 NOTE — Assessment & Plan Note (Signed)
Summary: having trouble eating,gets full fast/ams   Visit Type:  Follow-up Visit Primary Care Provider:  Syliva Overman  Chief Complaint:  difficulty eating and abd pain.  History of Present Illness: Patient is a pleasant 55 year old [place] American female, patient of Dr. Lodema Hong, who presents today for further evaluation of abdominal pain and trouble eating. She was last seen in January of 2008. She has a history of gastroesophageal reflux disease, gastroparesis. She previously took low-dose Reglan for gastroparesis but tells me that she stopped the medication due to facial twitching. She complains of postprandial fullness and early satiety. She does have heartburn at times and sometimes has pain in her chest which is relieved with belching. She admits to forgetting to take her over-the-counter Zegerid at times. She complains of intermittent epigastric pain. She has early a.m. nausea. She denies dysphagia. She states her bowel movements are fairly regular sometimes more frequent than others. She complains of a pulling or stretching sensation around her navel. She is also concerned that this spring Dr. Emelda Fear told her she was Hemoccult positive on digital rectal exam. Her last colonoscopy was around Aug 04, 2003 by Dr. Elpidio Anis and she was told that she had hemorrhoids at that time. She also has a history of iron deficiency anemia dating back to at least Aug 04, 2006. She does have frequent heavy menses with heavy bleeding at least 2 days of her cycle. Each cycle occurs about every 21 days or so.    Current Medications (verified): 1)  Glipizide 10 Mg  Tb24 (Glipizide) .... One Tab By Mouth Two Times A Day 2)  Allegra 180 Mg  Tabs (Fexofenadine Hcl) .... One Tab By Mouth Once Daily 3)  Benazepril-Hydrochlorothiazide 20-12.5 Mg  Tabs (Benazepril-Hydrochlorothiazide) .... Two Tab By Mouth Once Daily 4)  Zegerid 40-1100 Mg  Caps (Omeprazole-Sodium Bicarbonate) .... Take 1 Tablet By Mouth Once A Day 5)  Patanase  0.6 %  Soln (Olopatadine Hcl) .Marland Kitchen.. 1-2 Sprays Each Nostril Two Times A Day As Needed 6)  Cpap 8 [facility] 7)  Nifedipine 90 Mg Xr24h-Tab (Nifedipine) .... One Tab By Mouth Once Daily 8)  Astepro 137 Mcg/spray Soln (Azelastine Hcl) .... 2 Puffs Per Nostril Twice Daily 9)  Singulair 10 Mg Tabs (Montelukast Sodium) .... Take 1 Tablet By Mouth Once A Day 10)  Janumet 50-1000 Mg Tabs (Sitagliptin-Metformin Hcl) .... Take 1 Tablet By Mouth Two Times A Day 11)  Astelin 137 Mcg/spray Soln (Azelastine Hcl) .... 2 Sprays Two Times A Day As Needed 12)  Benadryl 25 Mg Caps (Diphenhydramine Hcl) .... As Needed 13)  Multivitamins  Tabs (Multiple Vitamin) .... Once Daily  Allergies (verified): 1)  ! Percocet 2)  ! Pcn 3)  ! Jonne Ply  Past History:  Past Surgical History: Last updated: 11/01/2008  C SECTION X 2 FOUR D&Cs LUMPECTOMY LEFT BREAST LAPAROSCOPY FOR ADHESIONS TENDINITIS SURGERY INGUINAL HERNINA REPAIR CHOLECYSTECTOMY RIGHT KNEE SURGERY LEFT SHOULDER SURGERY BLADDER TACKING PROCEDURE  Past Medical History: Allergic Rhinitis Diabetes, Type 2 Hypertension Sleep Apnea NPSG 05/09/07 AHI 12.3, ESS 16/24 MS Gastroparesis IDA Left complex cystic adnexal mass followed by Dr. Emelda Fear  Family History: allergies Asthma Aunt with cancer Mother deceased in Aug 03, 2008, ventricular fibbrilation with cardiac arrest Father deceased secondary to stroke, age 66 No FH of Colon Cancer:  Social History: Patient never smoked.  Positive history of passive tobacco smoke exposure- lives with smokers.  Married, adult children, 3, all healthy. unemployed.  Review of Systems General:  Denies fever, chills, and weight loss. Eyes:  Denies vision loss. ENT:  Denies nasal congestion, loss of smell, sore throat, hoarseness, and difficulty swallowing. CV:  Denies chest pains, angina, palpitations, dyspnea on exertion, and peripheral edema. Resp:  Denies dyspnea at rest, dyspnea with exercise,  and cough. GI:  See HPI. GU:  Denies urinary burning and blood in urine. MS:  Denies joint pain / LOM. Derm:  Denies rash. Neuro:  Denies weakness, paralysis, memory loss, and confusion. Psych:  Denies depression and anxiety. Endo:  Denies unusual weight change. Heme:  Denies bruising and bleeding. Allergy:  Denies hives and recurrent infections.  Vital Signs:  Patient profile:   55 year old female Menstrual status:  perimenopausal Height:      61.5 inches Weight:      208 pounds BMI:     38.80 Temp:     98.0 degrees F oral Pulse rate:   76 / minute BP sitting:   122 / 78  (left arm) Cuff size:   regular  Vitals Entered By: Hendricks Limes LPN (November 02, 2008 9:19 AM)  Physical Exam  General:  Well developed, well nourished, no acute distress. Head:  Normocephalic and atraumatic. Eyes:  Conjunctivae pink, no scleral icterus.  Mouth:  Oropharyngeal mucosa moist, pink.  No lesions, erythema or exudate.    Neck:  Supple; no masses or thyromegaly. Lungs:  Clear throughout to auscultation. Heart:  Regular rate and rhythm; no murmurs, rubs,  or bruits. Abdomen:  Bowel sounds normal.  Abdomen is soft, nontender, nondistended.  No rebound or guarding.  No hepatosplenomegaly, masses or hernias.  No abdominal bruits.  Extremities:  No clubbing, cyanosis, edema or deformities noted. Neurologic:  Alert and  oriented x4;  grossly normal neurologically. Skin:  Intact without significant lesions or rashes. Cervical Nodes:  No significant cervical adenopathy. Psych:  Alert and cooperative. Normal mood and affect.  Impression & Recommendations:  Problem # 1:  GASTROPARESIS (ICD-536.3)  History of mild gastroparesis with postprandial early satiety, poor appetite in the setting of recent loss of mother. She previously did well on low-dose Reglan until she developed facial twitching therefore would avoid further use. We may be able to adjust her diet and control her symptoms. Otherwise choices  would include erythromycin or domperidone. I explained to her that domperidone is not FDA approved and would have to be obtained from [place]. For now she desires to follow a gastroparesis diet.  Orders: Est. Patient Level IV (54098)  Problem # 2:  ANEMIA, IRON DEFICIENCY, CHRONIC (ICD-280.9)  Chronic iron deficiency anemia for at least 2 years duration. Previous anemia profile consistent with IDA with low ferritin as well. Hemoglobin has been stable in the 11 range. She recalls having a recent occult positive stool DRE by Dr. Emelda Fear. Last colonoscopy 2005. She has frequent heavy menses which could easily explain IDA however we need to exclude source from chronic occult GI bleeding. Initially would like to complete I FOBT. This will help [place] determine the extent of workup needed. She will likely need colonoscopy and endoscopy. Further recommendations to follow.  Orders: Est. Patient Level IV (11914)  Problem # 3:  GERD (ICD-530.81)  controlled on Zegerid OTC when she takes the medication.  Orders: Est. Patient Level IV (78295)  Problem # 4:  EPIGASTRIC PAIN (ICD-789.06)  mild intermittent epigastric pain. Encouraged daily Zegerid use. If symptoms are ongoing she will need endoscopy. Await pending IF and BP.  Orders: Est. Patient Level IV (62130)  Appended Document: having trouble eating,gets full fast/ams ifobt  negative.  Recent heme positive by Dr. Emelda Fear, DRE.  IDA, chronic.  Positive UGI symptoms.  Last TCS 2005.  Discussed with Dr. Jena Gauss.  Recommend TCS/EGD.  Day of prep, take 1/2 dose of glipizide and janumet.  Appended Document: having trouble eating,gets full fast/ams Called and no answer.  Appended Document: having trouble eating,gets full fast/ams Pt informed.  Appended Document: having trouble eating,gets full fast/ams Pt is scheduled for 11/26/08@ 8:15am  Pt is aware of appt.  Instructions placed in the mail

## 2010-04-11 NOTE — Assessment & Plan Note (Signed)
Summary: 1 month/apc   Visit Type:  Follow-up Referred by:  M. Lodema Hong PCP:  Lodema Hong  Chief Complaint:  follow up.  History of Present Illness: Current Problems:  HYPERTENSION (ICD-401.9) DIABETES, TYPE 2 (ICD-250.00) ALLERGIC RHINITIS (ICD-477.9) SLEEP APNEA (ICD-780.57)  Michaela Peterson lost the skin test results that she was going to get [place] from her previous allergist and is going to ask for another copy.  She feels tired, swollen and puffy.  She can't tell if there's been any change in her multiple sclerosis for which she sees Dr. Thad Ranger.  Indoor heat makes her feel worse.  She's always itching sneezing and watering without seasonal change.  She has tried Zyrtec and she has tried Zadiitor eyedrops.  She prefers Patanase nasal spray, which was refilled.  She had CPAP auto titration done at [facility].  The chip has been turned in but we don't have the report yet.        Current Allergies (reviewed today): ! PERCOCET ! PCN  Past Medical History:    Reviewed history from 05/16/2007 and no changes required:       Allergic Rhinitis       Diabetes, Type 2       Hypertension       Sleep Apnea NPSG 05/09/07 AHI 12.3, ESS 16/24       MS  Past Surgical History:    Reviewed history from 04/18/2007 and no changes required:       left rotator cuff repair       Right knee repair       C-section x 2       Hernia repair       R wrist   Family History:    Reviewed history from 04/18/2007 and no changes required:       allergies       Asthma       Aunt with cancer       Mother has OSA  Social History:    Reviewed history from 04/18/2007 and no changes required:       Patient never smoked.        Positive history of passive tobacco smoke exposure- lives with smokers.     Review of Systems      See HPI   Vital Signs:  Patient Profile:   55 Years Old Female Weight:      217.25 pounds O2 Sat:      96 % O2 treatment:    Room Air Pulse rate:   73 / minute BP  sitting:   124 / 74  (left arm) Cuff size:   large  Vitals Entered By: Reynaldo Minium CMA (June 16, 2007 10:55 AM)             Comments Medications reviewed with patient n ..................................................................Marland KitchenReynaldo Minium CMA  June 16, 2007 10:55 AM      Physical Exam  Eyes:     periorbital edema conjuctivae not injected Ears:     TMs intact and clear with normal canals Nose:     mild turbinated edema Mouth:     Melampatti Class III.   Neck:     no JVD.   Lungs:     clear bilaterally to auscultation and percussion Heart:     regular rate and rhythm, S1, S2 without murmurs, rubs, gallops, or clicks     Impression & Recommendations:  Problem # 1:  ALLERGIC RHINITIS (ICD-477.9) Assessment: Deteriorated Marked seasonal exacerbation. She likes  Patanase and that is being refilled. The following medications were removed from the medication list:    Astepro 137 Mcg/spray Soln (Azelastine hcl) ..... Use as directed    Fexofenadine Hcl 180 Mg Tabs (Fexofenadine hcl) .Marland Kitchen... Take 1 tablet by mouth once a day  Her updated medication list for this problem includes:    Rhinocort Aqua 32 Mcg/act Susp (Budesonide (nasal)) ..... One spray each nostril once daily    Allegra 180 Mg Tabs (Fexofenadine hcl) ..... One tab by mouth once daily    Patanase 0.6 % Soln (Olopatadine hcl) .Marland Kitchen... 1-2 sprays each nostril two times a day as needed  Orders: Est. Patient Level III ([address])   Problem # 2:  SLEEP APNEA (ICD-780.57) some of her fatigue may reflect her MS. She is to turn in her autotitration chip to [facility]. She will work with her neurologist to assess importance of Multiple Sclerosis in her symptoms. Orders: Est. Patient Level III (60454) DME Referral (DME)   Medications Added to Medication List This Visit: 1)  Patanase 0.6 % Soln (Olopatadine hcl) .Marland Kitchen.. 1-2 sprays each nostril two times a day as needed   Patient Instructions: 1)   Please schedule a follow-up appointment in 4 months. 2)  See your MS doctor  3)  Try taking Zyrtec at night so it won't make you sleepier    Prescriptions: PATANASE 0.6 %  SOLN (OLOPATADINE HCL) 1-2 sprays each nostril two times a day as needed  #1 x prn   Entered and Authorized by:   Waymon Budge MD   Signed by:   Waymon Budge MD on 06/16/2007   Method used:   Electronically sent to ...       [facility]  [place] [phone][facility]       [address]       [place], [place]  [address]       Ph: [phone] or [phone]       Fax: [phone]   RxID:   [id number]  ]  Appended Document: Orders Update [facility] autotitration download for cpap 8   Clinical Lists Changes  Medications: Added new medication of * CPAP 8 [facility] Orders: Added new Referral order of DME Referral (DME) - Signed

## 2010-04-11 NOTE — Letter (Signed)
Summary: Scheduled Appointment  [facility]  [address]   [place], [place] [address]   Phone: [phone]  Fax: [phone]    June 27, 2009   Dear: Michaela Peterson            DOB: December 12, 1955    I have been instructed to schedule you an appointment in our office.  Your appointment is as follows:   Date:  Jul 20, 2009   Time:  3:30 PM   Please be here 15 minutes early.   Provider:  DR Jena Gauss    Please contact the office if you need to reschedule this appointment for a more convenient time.  Also, please have lab work done prior to your appointment.   Thank you,    Diana Eves       [facility] Ph: [phone]   Fax: [phone]

## 2010-04-12 ENCOUNTER — Other Ambulatory Visit: Payer: Self-pay | Admitting: Obstetrics and Gynecology

## 2010-04-12 ENCOUNTER — Other Ambulatory Visit (HOSPITAL_COMMUNITY)
Admission: RE | Admit: 2010-04-12 | Discharge: 2010-04-12 | Disposition: A | Discharge status: Home / Self Care | Payer: BC Managed Care – PPO | Source: Ambulatory Visit | Attending: Obstetrics and Gynecology | Admitting: Obstetrics and Gynecology

## 2010-04-12 DIAGNOSIS — Z01419 Encounter for gynecological examination (general) (routine) without abnormal findings: Secondary | ICD-10-CM | POA: Insufficient documentation

## 2010-04-13 ENCOUNTER — Telehealth: Payer: Self-pay | Admitting: Family Medicine

## 2010-04-13 NOTE — Medication Information (Signed)
Summary: PANTOPRAZOLE SOD DR 40MG   PANTOPRAZOLE SOD DR 40MG    Imported By: Rexene Alberts 02/24/2010 09:55:45  _____________________________________________________________________  External Attachment:    Type:   Image     Comment:   External Document  Appended Document: PANTOPRAZOLE SOD DR 40MG  are we able to do a PA for dexilant now?  Appended Document: PANTOPRAZOLE SOD DR 40MG  pt needed to try, prilosec, prevacid and protonix prior to getting dexilant  Appended Document: PANTOPRAZOLE SOD DR 40MG  per documentation pt has tried all three. Do you want to start PA?  Appended Document: PANTOPRAZOLE SOD DR 40MG  yes that'd be great. If she needs an additional refill to get her through, we can do that. Pantoprazole 40 mg by mouth 30 minutes prior to meal, disp#30 with 0 refills to pharmacy. maybe that will get [place] through till dexilant covered (if it does).   Appended Document: PANTOPRAZOLE SOD DR 40MG  spoke with pt- she would like to try the pantoprazole one more month prior to doing PA for dexilant. Asked pt to call us before she runs out to let us know if she would like to continue pantoprazole or try PA for dexilant so we have time to do paperwork for PA. pt verbalized understanding. [facility] [place] [facility]

## 2010-04-13 NOTE — Letter (Signed)
Summary: Plan of Care, Need to Discuss  [facility]  [address]   [place], [place] [address]   Phone: [phone]  Fax: [phone]    March 07, 2010  Michaela Peterson [address] [place], [place]  [address] Jun 09, 1955   Dear Ms. Strain,   We are writing this letter to inform you of treatment plans and/or discuss your plan of care.  We have tried several times to contact you; however, we have yet to reach you.  We ask that you please contact our office for follow-up on your gastrointestinal issues.  We can  be reached at [phone] to schedule an appointment, or to speak with someone regarding your health care needs.  Please do not neglect your health.   Sincerely,    Cloria Spring LPN  [facility] Ph: [phone]    Fax: [phone]

## 2010-04-13 NOTE — Progress Notes (Signed)
  Phone Note Call from Patient   Reason for Call: Talk to Nurse Summary of Call: Pt received letter and has questions regarding her lab work done recently. [phone] Initial call taken by: Diana Eves,  March 09, 2010 2:32 PM     Appended Document:  spoke with pt, informed of lab results

## 2010-04-13 NOTE — Assessment & Plan Note (Signed)
Summary: office visit   Vital Signs:  Patient profile:   55 year old female Menstrual status:  perimenopausal Height:      61.5 inches Weight:      201.75 pounds BMI:     37.64 O2 Sat:      98 % on Room air Pulse rate:   84 / minute Pulse rhythm:   regular Resp:     16 per minute BP sitting:   126 / 80  (left arm)  Vitals Entered By: Adella Hare LPN (February 28, 2010 10:22 AM)  Nutrition Counseling: Patient's BMI is greater than 25 and therefore counseled on weight management options.  O2 Flow:  Room air CC: follow-up visit Is Patient Diabetic? Yes   Primary Care Provider:  Mannix Kroeker  CC:  follow-up visit.  History of Present Illness: Reports  that she has been doing fairly well Denies recent fever or chills. Denies sinus pressure, nasal congestion ,  or sore throat. Denies chest congestion, or cough productive of sputum. Denies chest pain, palpitations, PND, orthopnea or leg swelling. Denies abdominal pain, nausea, vomitting, diarrhea or constipation. Denies change in bowel movements or bloody stool.  Denies  joint pain, swelling, or reduced mobility. Denies headaches, vertigo, seizures. Denies depression, anxiety or insomnia. Denies  rash, lesions, or itch.     Current Medications (verified): 1)  Glipizide 10 Mg  Tb24 (Glipizide) .... One Tab By Mouth Two Times A Day 2)  Cpap 8 [facility] 3)  Nifedipine 90 Mg Xr24h-Tab (Nifedipine) .... One Tab By Mouth Once Daily 4)  Onetouch Ultra Test  Strp (Glucose Blood) .... Test One Time Daily. 5)  Janumet 50-1000 Mg Tabs (Sitagliptin-Metformin Hcl) .... Take 1 Tablet By Mouth Two Times A Day 6)  Lancets and Strips .... Two Times A Day Testing For Relion Meter 7)  Ibuprofen 800 Mg Tabs (Ibuprofen) .... Take 1 Tablet By Mouth Two Times A Day As Needed 8)  Restasis 0.05 % Emul (Cyclosporine) .... As Directed 9)  Stool Softner .Marland Kitchen.. 2-3 Tablets Daily 10)  Pantoprazole Sodium 40 Mg Tbec (Pantoprazole Sodium)  .... One Tab By Mouth Once Daily 11)  Lotensin Hct 20-12.5 Mg Tabs (Benazepril-Hydrochlorothiazide) .... Two Tabs By Mouth Once Daily  Allergies (verified): 1)  ! Percocet 2)  ! Pcn 3)  ! Asa  Review of Systems      See HPI General:  Complains of fatigue. Eyes:  Denies discharge, eye pain, and red eye. ENT:  bilateral itching ears with vertigo and pressure. GU:  Complains of dysuria and urinary frequency; pressure with urinating, x 3 weeks. Neuro:  Complains of sensation of room spinning; 2 weeks. Endo:  Denies cold intolerance, excessive thirst, excessive urination, and heat intolerance; testing on avg once daily, blood sugars uncontrolled, elevated out of range. Heme:  Denies abnormal bruising and bleeding. Allergy:  Complains of seasonal allergies; denies hives or rash and itching eyes.  Physical Exam  General:  Well-developed,well-nourished,in no acute distress; alert,appropriate and cooperative throughout examination HEENT: No facial asymmetry,  EOMI, No sinus tenderness, TM's Clear, oropharynx  pink and moist.   Chest: Clear to auscultation bilaterally.  CVS: S1, S2, No murmurs, No S3.   Abd: Soft, Nontender.  MS: Adequate ROM spine, hips, shoulders and knees.  Ext: No edema.   CNS: CN 2-12 intact, power tone and sensation normal throughout.   Skin: Intact, no visible lesions or rashes.  Psych: Good eye contact, normal affect.  Memory intact, not anxious or depressed appearing.  Impression & Recommendations:  Problem # 1:  ACUTE CYSTITIS (ICD-595.0) Assessment Comment Only  Orders: Urinalysis (81003-65000)negative for infection  Encouraged to push clear liquids, get enough rest, and take acetaminophen as needed. To be seen in 10 days if no improvement, sooner if worse.  Problem # 2:  OBESITY (ICD-278.00) Assessment: Unchanged  Ht: 61.5 (02/28/2010)   Wt: 201.75 (02/28/2010)   BMI: 37.64 (02/28/2010) therapeutic lifestyle change discussed and  encouraged  Problem # 3:  DIABETES, TYPE 2 (ICD-250.00) Assessment: Comment Only  Her updated medication list for this problem includes:    Glipizide 10 Mg Tb24 (Glipizide) ..... One tab by mouth two times a day    Janumet 50-1000 Mg Tabs (Sitagliptin-metformin hcl) .Marland Kitchen... Take 1 tablet by mouth two times a day    Lotensin Hct 20-12.5 Mg Tabs (Benazepril-hydrochlorothiazide) .Marland Kitchen..Marland Kitchen Two tabs by mouth once daily Patient advised to reduce carbs and sweets, commit to regular physical activity, take meds as prescribed, test blood sugars as directed, and attempt to lose weight , to improve blood sugar control.  Orders: T- Hemoglobin A1C [phone]) T- Hemoglobin A1C ([id number])  Labs Reviewed: Creat: 0.68 (11/28/2009)    Reviewed HgBA1c results: 7.6 (11/28/2009)  6.8 (08/02/2009)  Problem # 4:  MULTIPLE SCLEROSIS (ICD-340) Assessment: Comment Only recently seen by pA , need to find neurologist to follow pt, on no meds  Complete Medication List: 1)  Glipizide 10 Mg Tb24 (Glipizide) .... One tab by mouth two times a day 2)  Cpap 8 [facility]  3)  Nifedipine 90 Mg Xr24h-tab (Nifedipine) .... One tab by mouth once daily 4)  Onetouch Ultra Test Strp (Glucose blood) .... Test one time daily. 5)  Janumet 50-1000 Mg Tabs (Sitagliptin-metformin hcl) .... Take 1 tablet by mouth two times a day 6)  Lancets and Strips  .... Two times a day testing for relion meter 7)  Ibuprofen 800 Mg Tabs (Ibuprofen) .... Take 1 tablet by mouth two times a day as needed 8)  Restasis 0.05 % Emul (Cyclosporine) .... As directed 9)  Stool Softner  .Marland Kitchen.. 2-3 tablets daily 10)  Pantoprazole Sodium 40 Mg Tbec (Pantoprazole sodium) .... One tab by mouth once daily 11)  Lotensin Hct 20-12.5 Mg Tabs (Benazepril-hydrochlorothiazide) .... Two tabs by mouth once daily 12)  Meclizine Hcl 25 Mg Chew (Meclizine hcl) .... Take 1 tablet by mouth three times a day as needed for vertigo 13)  Auralgan 1.4-5.5 % Soln  (Benzocaine-antipyrine) .... One drop to affected ear(s) three times daily as needed for pain  Other Orders: T-Basic Metabolic Panel [id number]) T-Hepatic Function [id number]) T-Lipid Profile [id number])  Patient Instructions: 1)  Please schedule a follow-up appointment in 3 months. 2)  It is important that you exercise regularly at least 20 minutes 5 times a week. If you develop chest pain, have severe difficulty breathing, or feel very tired , stop exercising immediately and seek medical attention. 3)  You need to lose weight. Consider a lower calorie diet and regular exercise.  4)  Check your blood sugars once daily. If your readings are usually above 250 or below 70 you should contact our office. 5)  HbgA1C prior to visit, ICD-9:  today. 6)  BMP prior to visit, ICD-9: 7)  Hepatic Panel prior to visit, ICD-9: 8)  Lipid Panel prior to visit, ICD-9:  fasting  in 3 months 9)  HbgA1C prior to visit, ICD-9: 10)  we will  check you urine for infection, if abn we will send ciprofloxacin for  this, we will let you know Prescriptions: JANUMET 50-1000 MG TABS (SITAGLIPTIN-METFORMIN HCL) Take 1 tablet by mouth two times a day  #60 Each x 3   Entered by:   Adella Hare LPN   Authorized by:   Syliva Overman MD   Signed by:   Adella Hare LPN on 40/34/7425   Method used:   [facility] to        [facility] [phone][facility] (retail)       [address]       [place], [place]  [address]       Ph: [phone] or [phone]       Fax: [phone]   RxID:   [id number] AURALGAN 1.4-5.5 % SOLN (BENZOCAINE-ANTIPYRINE) one drop to affected ear(s) three times daily as needed for pain  #10cc x 0   Entered and Authorized by:   Syliva Overman MD   Signed by:   Syliva Overman MD on 02/28/2010   Method used:   Electronically to        [facility] [phone]* (retail)       [address]       [place], [place]  [address]       Ph: [phone] or [phone]        Fax: [phone]   RxID:   [id number] MECLIZINE HCL 25 MG CHEW (MECLIZINE HCL) Take 1 tablet by mouth three times a day as needed for vertigo  #30 x 0   Entered and Authorized by:   Syliva Overman MD   Signed by:   Syliva Overman MD on 02/28/2010   Method used:   Electronically to        [facility] [phone]* (retail)       [address]       [place], [place]  [address]       Ph: [phone] or [phone]       Fax: [phone]   RxID:   [id number]    Orders Added: 1)  Est. Patient Level IV [76734] 2)  T- Hemoglobin A1C [83036-23375] 3)  T-Basic Metabolic Panel [[id number]] 4)  T-Hepatic Function [80076-22960] 5)  T-Lipid Profile [80061-22930] 6)  T- Hemoglobin A1C [83036-23375] 7)  Urinalysis [81003-65000]    Laboratory Results   Urine Tests  Date/Time Received: February 28, 2010 11:26 AM  Date/Time Reported: February 28, 2010 11:26 AM   Routine Urinalysis   Color: yellow Appearance: Clear Glucose: negative   (Normal Range: Negative) Bilirubin: negative   (Normal Range: Negative) Ketone: negative   (Normal Range: Negative) Spec. Gravity: 1.025   (Normal Range: 1.003-1.035) Blood: negative   (Normal Range: Negative) pH: 5.5   (Normal Range: 5.0-8.0) Protein: negative   (Normal Range: Negative) Urobilinogen: 0.2   (Normal Range: 0-1) Nitrite: negative   (Normal Range: Negative) Leukocyte Esterace: negative   (Normal Range: Negative)

## 2010-04-13 NOTE — Progress Notes (Signed)
Summary: medicine  Phone Note Call from Patient   Summary of Call: needs her fexofenadine 180 mg [facility] [place] [facility] said that they [facility]ed over the rx and have not received anyhing Initial call taken by: [name],  March 29, 2010 2:03 PM    New/Updated Medications: FEXOFENADINE HCL 180 MG TABS (FEXOFENADINE HCL) one tab by mouth once daily Prescriptions: FEXOFENADINE HCL 180 MG TABS (FEXOFENADINE HCL) one tab by mouth once daily  #30 x 2   Entered by:   Adella Hare LPN   Authorized by:   Syliva Overman MD   Signed by:   Adella Hare LPN on 42/59/5638   Method used:   Electronically to        [facility] [phone]* (retail)       [address]       [place], [place]  [address]       Ph: [phone]       Fax: [phone]   RxID:   [id number]

## 2010-04-25 ENCOUNTER — Other Ambulatory Visit: Payer: Self-pay | Admitting: Obstetrics and Gynecology

## 2010-04-27 NOTE — Progress Notes (Signed)
Summary: MEDICINE  Phone Note Call from Patient   Summary of Call: PATIENT WANTS HER GENERIC RX 90 DAY SUPPLY TO [facility] Initial call taken by: [name],  April 13, 2010 8:34 AM  Follow-up for Phone Call        what med?  called patient, left message Follow-up by: Adella Hare LPN,  April 13, 2010 10:45 AM  Additional Follow-up for Phone Call Additional follow up Details #1::        called patient, no answer Additional Follow-up by: Adella Hare LPN,  April 14, 2010 1:21 PM    Prescriptions: LOTENSIN HCT 20-12.5 MG TABS (BENAZEPRIL-HYDROCHLOROTHIAZIDE) two tabs by mouth once daily  #180 x 0   Entered by:   Adella Hare LPN   Authorized by:   Syliva Overman MD   Signed by:   Adella Hare LPN on 16/12/9602   Method used:   Faxed to ...       [facility] (mail-order)             , [place]         Ph: [phone]       Fax: [phone]   RxID:   [id number] NIFEDIPINE 90 MG XR24H-TAB (NIFEDIPINE) one tab by mouth once daily  #90 x 0   Entered by:   Adella Hare LPN   Authorized by:   Syliva Overman MD   Signed by:   Adella Hare LPN on 32/44/0102   Method used:   Faxed to ...       [facility] (mail-order)             , [place]         Ph: [phone]       Fax: [phone]   RxID:   [id number] GLIPIZIDE 10 MG  TB24 (GLIPIZIDE) one tab by mouth two times a day  #180 x 0   Entered by:   Adella Hare LPN   Authorized by:   Syliva Overman MD   Signed by:   Adella Hare LPN on 09/09/1599   Method used:   Faxed to ...       [facility] (mail-order)             , [place]         Ph: [phone]       Fax: [phone]   RxID:   [id number]

## 2010-05-22 ENCOUNTER — Telehealth: Payer: Self-pay | Admitting: Family Medicine

## 2010-05-23 ENCOUNTER — Encounter: Payer: Self-pay | Admitting: Internal Medicine

## 2010-05-29 ENCOUNTER — Encounter: Payer: Self-pay | Admitting: Family Medicine

## 2010-05-30 ENCOUNTER — Ambulatory Visit (INDEPENDENT_AMBULATORY_CARE_PROVIDER_SITE_OTHER): Payer: BC Managed Care – PPO | Admitting: Family Medicine

## 2010-05-30 ENCOUNTER — Encounter: Payer: Self-pay | Admitting: Family Medicine

## 2010-05-30 ENCOUNTER — Telehealth: Payer: Self-pay | Admitting: Family Medicine

## 2010-05-30 ENCOUNTER — Other Ambulatory Visit: Payer: Self-pay | Admitting: Family Medicine

## 2010-05-30 VITALS — BP 122/82 | HR 74 | Resp 16 | Ht 61.0 in | Wt 198.4 lb

## 2010-05-30 DIAGNOSIS — H9209 Otalgia, unspecified ear: Secondary | ICD-10-CM

## 2010-05-30 DIAGNOSIS — R002 Palpitations: Secondary | ICD-10-CM

## 2010-05-30 DIAGNOSIS — E669 Obesity, unspecified: Secondary | ICD-10-CM

## 2010-05-30 DIAGNOSIS — K219 Gastro-esophageal reflux disease without esophagitis: Secondary | ICD-10-CM

## 2010-05-30 DIAGNOSIS — R42 Dizziness and giddiness: Secondary | ICD-10-CM

## 2010-05-30 DIAGNOSIS — G4730 Sleep apnea, unspecified: Secondary | ICD-10-CM

## 2010-05-30 DIAGNOSIS — IMO0001 Reserved for inherently not codable concepts without codable children: Secondary | ICD-10-CM

## 2010-05-30 DIAGNOSIS — E119 Type 2 diabetes mellitus without complications: Secondary | ICD-10-CM

## 2010-05-30 DIAGNOSIS — I10 Essential (primary) hypertension: Secondary | ICD-10-CM

## 2010-05-30 DIAGNOSIS — Z79899 Other long term (current) drug therapy: Secondary | ICD-10-CM

## 2010-05-30 DIAGNOSIS — R079 Chest pain, unspecified: Secondary | ICD-10-CM

## 2010-05-30 DIAGNOSIS — L0390 Cellulitis, unspecified: Secondary | ICD-10-CM

## 2010-05-30 LAB — BASIC METABOLIC PANEL
BUN: 16 mg/dL (ref 6–23)
CO2: 22 mEq/L (ref 19–32)
Calcium: 9.6 mg/dL (ref 8.4–10.5)
Chloride: 104 mEq/L (ref 96–112)
Creat: 0.78 mg/dL (ref 0.40–1.20)
Glucose, Bld: 105 mg/dL — ABNORMAL HIGH (ref 70–99)
Potassium: 4 mEq/L (ref 3.5–5.3)
Sodium: 139 mEq/L (ref 135–145)

## 2010-05-30 LAB — HEMOGLOBIN A1C
Hgb A1c MFr Bld: 6.9 % — ABNORMAL HIGH (ref <5.7)
Mean Plasma Glucose: 151 mg/dL — ABNORMAL HIGH (ref <117)

## 2010-05-30 MED ORDER — TERBINAFINE HCL 250 MG PO TABS: 250.0000 mg | ORAL_TABLET | Freq: Every day | ORAL | Status: AC

## 2010-05-30 NOTE — Progress Notes (Signed)
  Subjective:    Patient ID: Michaela Peterson, female    DOB: [birth year], 55 y.o.   MRN: [id number]  HPI HPI Pt in for follow up of her chronic health problems. She does have new concerns regarding her cardiovascular system in particular, as well as her skin and nails , and chronic ear pain and vertigo. Patient reports that she is otherwise doing well Denies recent fever or chills. Denies sinus pressure, nasal congestion,or sore throat. Denies chest congestion, productive cough or wheezing. Denies abdominal pain, nausea, vomiting,diarrhea or constipation.  Denies rectal bleeding or change in bowel movement. Denies dysuria, frequency, hesitancy or incontinence. Intermittent  joint pain, and limitation and mobility. Denies headaches, seizure, numbness, or tingling. Denies depression, anxiety or insomnia. Denies skin break down       Review of Systems  HENT: Positive for ear pain ( 6 month history, bilateral, with itching and crawly sensation).   Respiratory: Positive for chest tightness and shortness of breath.   Cardiovascular: Positive for chest pain, palpitations and leg swelling.       Chronic complaints , more than 1 year, but concerned due to personal and family history of CAD, last cardiac eval over 4 yrs ago  Skin: Positive for rash.       Thickened toenails and cracked feet  Neurological: Positive for dizziness (3 times weekly , less than 5 mins, not driving as a result).       Objective:   Physical Exam        Assessment & Plan:   Patient alert and oriented and in no Cardiopulmonary distress.  HEENT: No facial asymmetry, EOMI, no sinus tenderness, TM's clear, Oropharynx pink and moist.  Neck supple no adenopathy.  Chest: Clear to auscultation bilaterally.  CVS: S1, S2 no murmurs, no S3.  ABD: Soft non tender. Bowel sounds normal.  Ext: No edema  MS: Adequate ROM spine, shoulders, hips and knees.  Skin: Intact,cracked soles, with thickened toenails Psych:  Good eye contact, normal affect. Memory intact not anxious or depressed appearing.  CNS: CN 2-12 intact, power, tone and sensation normal throughout.   Asesment  And plan. Hypertension: controlled, no med change High cardiovascular risk due to hTn and diabetes, with increased chest pain, palpitation and fatigue, will refer for cardiology re-eval, last was approx 5 yrs ago  3. Type 2 DM: pt encouraged to follow treatment plan and diet and test regulalrly, check HBA1C today. 4.Obesity: lifestyle change to facilitate weight loss discussed and encouraged, with goals se tby pt 5.Otalgia, unchanged, with normal ear exam, pt to call if symptoms worsen for ENT eval. Vertigo; chronic, antivert as needed and possible ENT evakl per pt decision.Marland Kitchen

## 2010-05-30 NOTE — Progress Notes (Signed)
Summary: strips  Phone Note Call from Patient   Summary of Call: needs her strips sent to [facility] in [place] Initial call taken by: [name],  May 22, 2010 10:41 AM    New/Updated Medications: RELION CONFIRM/MICRO TEST  STRP (GLUCOSE BLOOD) two times a day testing Prescriptions: RELION CONFIRM/MICRO TEST  STRP (GLUCOSE BLOOD) two times a day testing  #60 x 3   Entered by:   Everitt Amber LPN   Authorized by:   Syliva Overman MD   Signed by:   Everitt Amber LPN on 16/12/9602   Method used:   Historical   RxID:   [id number]

## 2010-05-30 NOTE — Telephone Encounter (Signed)
Patient will need lab test which insurance is not likely to pay for. Is this correct?

## 2010-05-30 NOTE — Letter (Signed)
Summary: RX REQUEST PANTOPRAZOLE  RX REQUEST PANTOPRAZOLE   Imported By: Rexene Alberts 05/23/2010 10:36:06  _____________________________________________________________________  External Attachment:    Type:   Image     Comment:   External Document

## 2010-05-31 LAB — CREATININE, SERUM
Creatinine, Ser: 0.66 mg/dL (ref 0.4–1.2)
GFR calc Af Amer: >60 mL/min (ref >60)
GFR calc non Af Amer: >60 mL/min (ref >60)

## 2010-05-31 NOTE — Telephone Encounter (Signed)
Message meant for Dr Lodema Hong

## 2010-05-31 NOTE — Telephone Encounter (Signed)
Blood type is not covered,labs she needs were requested at [facility] yesterday, pls let her know

## 2010-05-31 NOTE — Telephone Encounter (Signed)
Patient aware.

## 2010-06-01 MED ORDER — MUPIROCIN CALCIUM 2 % EX CREA: TOPICAL_CREAM | CUTANEOUS | Status: DC

## 2010-06-01 NOTE — Telephone Encounter (Signed)
rx ordered in [facility], pls let pt know

## 2010-06-02 NOTE — Telephone Encounter (Signed)
Med sent to pharmacy.

## 2010-06-04 NOTE — Patient Instructions (Signed)
F/u in 3 months.  Labs due and are ordered. You are referred to cardiology. Please call for ENT eval if you decide. Medication is sent in for fungal infection.  Lifestyle changes and regular blood sugar testing as discussed for improved health.

## 2010-06-16 LAB — HEPATIC FUNCTION PANEL
ALT: 71 U/L — ABNORMAL HIGH (ref 0–35)
AST: 65 U/L — ABNORMAL HIGH (ref 0–37)
Albumin: 3.9 g/dL (ref 3.5–5.2)
Alkaline Phosphatase: 81 U/L (ref 39–117)
Bilirubin, Direct: 0.1 mg/dL (ref 0.0–0.3)
Indirect Bilirubin: 0.3 mg/dL (ref 0.3–0.9)
Total Bilirubin: 0.4 mg/dL (ref 0.3–1.2)
Total Protein: 7.3 g/dL (ref 6.0–8.3)

## 2010-06-16 LAB — GLUCOSE, CAPILLARY: Glucose-Capillary: 172 mg/dL — ABNORMAL HIGH (ref 70–99)

## 2010-06-26 ENCOUNTER — Encounter: Payer: Self-pay | Admitting: Cardiology

## 2010-06-26 ENCOUNTER — Encounter: Payer: Self-pay | Admitting: *Deleted

## 2010-06-26 ENCOUNTER — Ambulatory Visit (INDEPENDENT_AMBULATORY_CARE_PROVIDER_SITE_OTHER): Payer: BC Managed Care – PPO | Admitting: Cardiology

## 2010-06-26 DIAGNOSIS — I10 Essential (primary) hypertension: Secondary | ICD-10-CM | POA: Insufficient documentation

## 2010-06-26 DIAGNOSIS — G4730 Sleep apnea, unspecified: Secondary | ICD-10-CM

## 2010-06-26 DIAGNOSIS — R002 Palpitations: Secondary | ICD-10-CM

## 2010-06-26 DIAGNOSIS — R0602 Shortness of breath: Secondary | ICD-10-CM

## 2010-06-26 DIAGNOSIS — I272 Pulmonary hypertension, unspecified: Secondary | ICD-10-CM | POA: Insufficient documentation

## 2010-06-26 DIAGNOSIS — E785 Hyperlipidemia, unspecified: Secondary | ICD-10-CM

## 2010-06-26 DIAGNOSIS — G35 Multiple sclerosis: Secondary | ICD-10-CM

## 2010-06-26 MED ORDER — PRAVASTATIN SODIUM 40 MG PO TABS: 40.0000 mg | ORAL_TABLET | Freq: Every day | ORAL | Status: DC

## 2010-06-26 NOTE — Assessment & Plan Note (Addendum)
Diagnosis apparently was not definitive, but patient was thought to have restless leg syndrome.  She may well have sleep apnea in addition and may benefit from positive pressure ventilation.  A repeat sleep study will be obtained.  Patient is strongly encouraged to lose weight, which will likely benefit orthopaedic issues, ventilatory problems and mobility.

## 2010-06-26 NOTE — Progress Notes (Signed)
HPI:  Michaela Peterson is seen at the kind request of Dr. Lodema Hong for assessment of palpitations, exertional dyspnea and chest discomfort in the setting of multiple cardiovascular risk factors.  This nice woman has enjoyed generally good health.  She was evaluated by [facility] a few years ago without cardiac disease being identified.  She generally is capable of performing her activities of daily living , but has class II dyspnea on exertion.  She notes occasional intermittent brief palpitations consistent with the description of PVCs or PACs.  She has minimal intermittent chest discomfort.  Current Outpatient Prescriptions on File Prior to Visit  Medication Sig Dispense Refill  . benazepril-hydrochlorthiazide (LOTENSIN HCT) 20-12.5 MG per tablet Take 2 tablets by mouth daily.        . Benzocaine-Antipyrine (AURALGAN) 1.4-5.5 % SOLN Place in ear(s). Apply one drop to affected ear (s) three times a day as needed for pain      . cycloSPORINE (RESTASIS) 0.05 % ophthalmic emulsion 1 drop as directed.        . fexofenadine (ALLEGRA) 180 MG tablet Take 180 mg by mouth daily.        Marland Kitchen glipiZIDE (GLIPIZIDE XL) 10 MG 24 hr tablet Take 10 mg by mouth daily.        Marland Kitchen glucose blood (RELION CONFIRM/MICRO TEST) test strip 1 each by Other route 2 (two) times daily. Use as instructed       . glucose blood test strip 1 each by Other route as needed. Use as instructed       . ibuprofen (ADVIL,MOTRIN) 800 MG tablet Take 800 mg by mouth. Take one tablet by mouth two times a day as needed       . Lancets MISC by Does not apply route.        . Meclizine HCl 25 MG CHEW Chew by mouth. Take one tablet by mouth three times a day as needed for vertigo       . montelukast (SINGULAIR) 10 MG tablet Take 10 mg by mouth at bedtime.        . mupirocin (BACTROBAN) 2 % cream Apply to affected area 3 times daily  30 g  0  . NIFEdipine (PROCARDIA XL) 90 MG 24 hr tablet Take 90 mg by mouth daily.        . pantoprazole (PROTONIX)  40 MG tablet Take 40 mg by mouth daily.        . pravastatin (PRAVACHOL) 40 MG tablet Take 1 tablet (40 mg total) by mouth daily.  30 tablet  6  . sitaGLIPtan-metformin (JANUMET) 50-1000 MG per tablet Take 1 tablet by mouth 2 (two) times daily with a meal.        . terbinafine (LAMISIL) 250 MG tablet Take 1 tablet (250 mg total) by mouth daily.  90 tablet  0    Allergies  Allergen Reactions  . Aspirin     REACTION: unknown reaction  . Oxycodone-Acetaminophen   . Penicillins       Past Medical History  Diagnosis Date  . Diabetes mellitus, type 2     A1c of 6.9 in 05/2010  . Sleep apnea 2009  . MS (multiple sclerosis)   . GERD (gastroesophageal reflux disease)     GastroparesisS.;small hiatal hernia  . Hypertension   . Chronic low back pain   . Nonalcoholic fatty liver disease   . Obesity   . Anemia, iron deficiency   . Adnexal mass     Left; followed  by Dr. Emelda Fear     Past Surgical History  Procedure Date  . Dilation and curettage of uterus     4  . Laparoscopy abdomen diagnostic     lysis of adhesions  . Tendinitis surgery   . Cholecystectomy   . Right knee surgery   . Left shoulder surgery   . Bladder suspension   . Cesarean section     x2   . Inguinal hernia repair   . Knee arthroscopy     Right  . Shoulder arthroscopy   . Wrist surgery   . Colonoscopy 2010    polyp biopsy-negative     Family History  Problem Relation Age of Onset  . Heart failure Mother   . Hypertension Mother   . Arthritis Mother   . Stroke Father   . Hypertension Sister   . Diabetes Sister   . Heart disease Sister   . Hypertension Son      History   Social History  . Marital Status: Married    Spouse Name: N/A    Number of Children: 3  . Years of Education: N/A   Occupational History  . unemployed     Social History Main Topics  . Smoking status: Never Smoker   . Smokeless tobacco: Not on file   Comment: patient lives with a smoker   . Alcohol Use: No  . Drug Use:  No  . Sexually Active: Not on file   Other Topics Concern  . Not on file   Social History Narrative  . No narrative on file     ROS: occasionally lightheaded; requires corrective lenses for near vision; rare episodes of vertigo; partial dentures; mild intermittent pedal edema.  All other systems reviewed and are negative.  PHYSICAL EXAM: BP 128/78  Pulse 72  Ht 5\' 1"  (1.549 m)  Wt 195 lb (88.451 kg)  BMI 36.84 kg/m2  SpO2 96%  LMP 05/14/2010  General-Well-developed; no acute distress HEENT-[place]/AT; PERRL; EOM intact; conjunctiva and lids nl Neck-No JVD; no carotid bruits Endocrine-No thyromegaly Lungs-No tachypnea, clear without rales, rhonchi or wheezes Cardiovascular- normal PMI; normal S1 and S2 Abdomen-BS normal; soft and non-tender without masses or organomegaly Musculoskeletal-No deformities, cyanosis or clubbing Neurologic-Nl cranial nerves; symmetric strength and tone Skin- Warm, no sig. Lesions Extremities-Nl distal pulses; 1/2+ edema  EKG:   Normal sinus rhythm; prominent QRS voltage; delayed R-wave progression; otherwise normal.  No previous tracings for comparison.  ASSESSMENT AND PLAN:

## 2010-06-26 NOTE — Patient Instructions (Signed)
Your physician recommends that you schedule a follow-up appointment in:4 MONTHS Your physician has recommended you make the following change in your medication:PRAVASTATIN 20MG  DAILY Your physician recommends that you return for lab work in: 2 MONTHS Your physician encouraged you to lose weight for better health.  Your physician discussed the importance of regular exercise and recommended that you start or continue a regular exercise program for good health.

## 2010-06-26 NOTE — Assessment & Plan Note (Signed)
Probably the result of hypoventilation obesity syndrome with the patient weight of 288 pounds and moderate CO2 retention.  With a moderately elevated d-dimer and a history of pulmonary embolism, chronic pulmonary embolization should be excluded with a VQ scan.

## 2010-06-26 NOTE — Assessment & Plan Note (Signed)
Blood pressure control is good; current medications will be continued. 

## 2010-06-29 ENCOUNTER — Other Ambulatory Visit: Payer: Self-pay | Admitting: Family Medicine

## 2010-06-29 DIAGNOSIS — Z139 Encounter for screening, unspecified: Secondary | ICD-10-CM

## 2010-07-11 ENCOUNTER — Ambulatory Visit: Payer: BC Managed Care – PPO | Admitting: Gastroenterology

## 2010-07-25 NOTE — Procedures (Signed)
NAME:  Michaela Peterson, Michaela Peterson NO.:  [id number]   MEDICAL RECORD NO.:  [id number]          PATIENT TYPE:  OUT   LOCATION:  SLEEP CENTER                 FACILITY:  [facility]   PHYSICIAN:  Clinton D. Maple Hudson, MD, FCCP, FACPDATE OF BIRTH:  29-Jul-1955   DATE OF STUDY:  05/09/2007                            NOCTURNAL POLYSOMNOGRAM   REFERRING PHYSICIAN:  Clinton D. Young, MD, FCCP, FACP   INDICATIONS FOR PROCEDURE:  Hypersomnia with sleep apnea.   RESULTS:  Epward sleepiness score 16/24, BMI 51, weight 270 pounds,  height 61 inches, neck 15 inches.   HOME MEDICATIONS:  Charted and reviewed.   SLEEP ARCHITECTURE:  Total sleep time 214.5 minutes with sleep  efficiency 49.3%. Stage 1 was 3.7%; Stage 2, 52.4%; Stage 3, 28%; REM  15.9% of total sleep time. Sleep latency 7 minutes, REM latency 45  minutes. Awake after sleep onset 9 minutes. Arousal index 3.9.   MEDICATIONS:  No bedtime medication was taken.   RESPIRATORY DATA:  Apnea hypopnea index (AHI) 12.3 per hour, indicating  mild obstructive sleep apnea/hypopnea syndrome. There were a total of 43  respiratory events including 1 mixed apnea and 42 hypopnea's. REM AHI  22.9. Events were more common while supine. There were insufficient  early events to permit CPAP titration by split protocol. The patient  indicated that she was uncomfortable on her back.   OXYGEN DATA:  Moderately loud snoring with oxygen desaturation to a  nadir of 87%. Mean oxygen saturation through the study was 93.7% on room  air.   CARDIAC DATA:  Normal sinus rhythm.   MOVEMENT/PARASOMNIA:  No movement disturbance with arousal. No bathroom  trips.   IMPRESSION/RECOMMENDATION:  1. Mild obstructive sleep apnea/hypopnea syndrome, AHI 12.3 per hour      with events more common while supine. Moderately loud snoring with      oxygen desaturation to a nadir of 87%.  2. Technician does not state why the patient wished to end the study      at 2:45 a.m.,  except to report that she had      been uncomfortable and was up to the bathroom.  3. Consider trial of CPAP therapy, returning for CPAP titration if      appropriate.      Clinton D. Maple Hudson, MD, FCCP, FACP  Diplomate, Biomedical engineer of Sleep Medicine  Electronically Signed     CDY/MEDQ  D:  05/17/2007 15:31:15  T:  05/18/2007 13:22:19  Job:  [id number]

## 2010-07-28 NOTE — Op Note (Signed)
[facility]  Patient:    Michaela Peterson, Michaela Peterson Visit Number: [id number] MRN: [id number]          Service Type: DSU Location: [facility] Attending Physician:  Colbert Ewing Dictated by:   Loreta Ave, M.D. Proc. Date: 05/15/01 Admit Date:  05/15/2001 Discharge Date: 05/15/2001                             Operative Report  PREOPERATIVE DIAGNOSIS:  Impingement syndrome, left shoulder.  POSTOPERATIVE DIAGNOSES:  Impingement syndrome, left shoulder, with torn anterior labrum, partial tearing long head biceps tendon, and grade 3 chondromalacia, acromioclavicular joint.  PROCEDURES: 1. Left shoulder examination under anesthesia, arthroscopy, with debridement    of labrum and biceps tendon, left shoulder. 2. Arthroscopic acromioplasty and bursectomy, left shoulder. 3. Excision, distal clavicle, left shoulder.  SURGEON:  Loreta Ave, M.D.  ASSISTANT:  Arlys John D. Petrarca, P.A.-C.  ANESTHESIA:  General.  ESTIMATED BLOOD LOSS:  Minimal.  SPECIMENS:  None.  CULTURES:  None.  COMPLICATIONS:  None.  DRESSING:  Soft compressive.  DESCRIPTION OF PROCEDURE:  Patient brought to the operating room and placed on the operating table in supine position.  After adequate anesthesia had been obtained, the left shoulder examined.  Full motion, good stability.  Placed in beach chair position on the shoulder positioner, prepped and draped in the usual sterile fashion.  Three standard arthroscopic portals, anterior, posterior, and lateral.  Shoulder entered with a blunt obturator.  Distended and inspected with findings as follows:  No degenerative changes.  Capsular and ligamentous structures intact.  Frayed anterior labrum, debrided with a shaver to a stable surface.  Little fraying biceps tendon near the attachment, also debrided.  No significant compromise in structure.  No SLAP lesion. Undersurface rotator cuff, remaining structures normal.  Cannulas  were redirected subacromially.  Typical impingement with a type 2 acromion.  Bursa resected, cuff debrided.  Acromioplasty to a type 1 acromion.  CA ligament released.  The cuff intact.  Distal clavicle had spurs contributing to impingement and also grade 3 chondromalacia.  Lateral centimeter sharply resected.  Adequacy of decompression confirmed viewing from all portals. Instruments and fluid removed.  Portals, shoulder, and bursa injected with Marcaine.  Portals closed with 4-0 nylon.  Sterile compressive dressing applied.  Anesthesia reversed.  Brought to the recovery room.  Tolerated the surgery well with no complications. Dictated by:   Loreta Ave, M.D. Attending Physician:  Colbert Ewing DD:  05/15/01 TD:  05/17/01 Job: [id number]

## 2010-07-28 NOTE — H&P (Signed)
NAME:  Michaela Peterson, Michaela Peterson                 ACCOUNT NO.:  [id number]   MEDICAL RECORD NO.:  [id number]          PATIENT TYPE:  AMB   LOCATION:  DAY                           FACILITY:  [facility]   PHYSICIAN:  Jerolyn Shin C. Katrinka Blazing, M.D.   DATE OF BIRTH:  Apr 04, 1955   DATE OF ADMISSION:  DATE OF DISCHARGE:  LH                                HISTORY & PHYSICAL   A 55 year old female with history of guaiac-positive stool, history of  intermittent diarrhea with noted blood in her stool.  She has a negative  family history of colon cancer or polyps.  The patient also has symptomatic  gastroesophageal reflux disease and increasing dyspepsia.  She is scheduled  for upper and lower endoscopy.   PAST MEDICAL HISTORY:  1.  Hypertension.  2.  Diabetes mellitus.  3.  Osteoarthritis.  4.  Ventricular arrhythmias.  5.  Multiple sclerosis.   MEDICATIONS:  1.  Glipizide 10 mg 2 daily.  2.  Metformin 100 mg 2 daily.  3.  Lotrel 10/20 daily.  4.  Benazepril HCT 20/12.5 daily.   ALLERGIES:  PENICILLIN, PERCOCET.   PAST SURGICAL HISTORY:  1.  Cholecystectomy.  2.  Umbilical hernia repair.  3.  Laparoscopic adhesiolysis.  4.  C section x 2.  5.  Left breast biopsy.  6.  Left rotator cuff repair.   SOCIAL HISTORY:  Unremarkable.   PHYSICAL EXAMINATION:  VITAL SIGNS:  Blood pressure 156/88, pulse 80,  respirations 18, weight 194 pounds.  HEENT:  Unremarkable.  NECK:  Supple.  No JVD or bruit.  CHEST:  Clear to auscultation.  HEART:  Regular rate and rhythm without murmur, gallop, or rub.  ABDOMEN:  Soft, nontender.  No masses.  RECTAL:  Not repeated since the patient has guaiac-positive stool and bright  red rectal bleeding.  EXTREMITIES:  No cyanosis, clubbing, or edema.  NEUROLOGIC:  No focal motor, sensory, or cerebellar deficits.   IMPRESSION:  1.  Bright red rectal bleeding.  2.  Guaiac-positive stool.  3.  Diabetes mellitus.  4.  Hypertension.  5.  Osteoarthritis.  6.  Chronic dyspepsia.  7.   Multiple sclerosis.   PLAN:  Upper and lower endoscopy.     Lero   LCS/MEDQ  D:  12/22/2003  T:  12/22/2003  Job:  [id number]   cc:   Milus Mallick. Lodema Hong, M.D.  [address]  [place], [place] [address]  Fax: [phone]   [facility]  Fax: [phone]

## 2010-07-28 NOTE — Consult Note (Signed)
NAME:  Michaela Peterson, Michaela Peterson                 ACCOUNT NO.:  [id number]   MEDICAL RECORD NO.:  [id number]          PATIENT TYPE:  AMB   LOCATION:  DAY                           FACILITY:  [facility]   PHYSICIAN:  R. Roetta Sessions, M.D. DATE OF BIRTH:  [birth year]   DATE OF CONSULTATION:  DATE OF DISCHARGE:                                   CONSULTATION   REASON FOR CONSULTATION:  Abdominal pain, nausea, bloating.   Michaela Peterson is a pleasant 55 year old African-American female with type  2 diabetes mellitus.  Referred by Dr. Syliva Overman to further evaluate  at least a six month to eight month history of vague upper abdominal  fullness, bloating, regurgitation and reflux symptoms.  She has difficulty  sometimes swallowing pills, but really reports no difficulty with meats or  bread or other formed food elements.   She tells me she can eat supper at 7 o'clock and still taste it and  regurgitates it at midnight.  She rarely has any vomiting.  She has this  vague fullness quite a bit of the time, although she really denies true  early satiety and has not lost any weight per her report.  She is a little  on the constipated side, but has not had any melena or rectal bleeding.  She  was found to be Hemoccult positive on digital exam last year for which she  underwent a colonoscopy with Dr. Katrinka Blazing.  Apparently, she was told she had  hemorrhoids, but without other significant findings.  EGD also demonstrated  gastritis. I do not have the reports for review at this time.  Her  gallbladder was removed by Dr. Katrinka Blazing years ago.  It was reported to be  nonfunctioning.  Although I note her hemoglobin A1C was in the 6.8 range  one year ago, she says it is over 8 now.  She has had trouble with keeping  her blood sugars controlled.  She also tells me Dr. Lodema Hong gave her some  Aciphex, but she really did not take it for any longer than a week and could  not tell any difference in her symptoms.  She admits that  she does not like  taking medications.  She feels she is taking too many medications at this  time anyway.   She has not had a fever or chills.  Labs from December 02, 2003:  CBC 9.1,  H&H 11.2/35.3, MCV 81.1.  MET-7 okay except for glucose 127.  Liver profile  normal.  H. pylori 0.5.  Serologies normal at 0.5.  As stated above, her  LFTs are completely normal.   PAST MEDICAL HISTORY:  She really has a history of multiple sclerosis  followed by Dr. Thad Ranger in [place].  History of hypertension and  diabetes mellitus.   PAST SURGICAL HISTORY:  1.  C-section x2.  2.  Four D&Cs.  3.  Lumpectomy left breast.  4.  Laparoscopy for adhesions.  5.  Tendinitis surgery.  6.  Inguinal hernia repair.  7.  Cholecystectomy.  8.  Right knee surgery.  9.  Left shoulder surgery.  10. Bladder tacking procedure.   CURRENT MEDICATIONS:  1.  Nifedical XL 60 mg daily.  2.  Benazepril/HCTZ 20/12.5 mg daily.  3.  Glipizide 10 mg b.i.d.  4.  Actos plus 15/850 mg b.i.d.   ALLERGIES:  1.  PENICILLIN.  2.  PERCOCET.  3.  ASPIRIN.   FAMILY HISTORY:  Negative for chronic GI or liver disease.  Mother is alive  in fair health.  Father deceased secondary to CVA at age 3.  Otherwise  negative.   SOCIAL HISTORY:  The patient is married and has three sons.  She is  disabled.  No tobacco, no alcohol, no illicit drugs.  She does take herbal  remedies from time to time.   REVIEW OF SYSTEMS:  No chest pain or dyspnea on exertion.  No change in  weight.   PHYSICAL EXAMINATION:  GENERAL:  Reveals a pleasant 55 year old lady,  resting comfortably.  VITAL SIGNS:  Weight 193.5, height 5 feet 1 inches, temperature 98.1, blood  pressure 150/86, pulse 68.  SKIN:  Warm and dry.  HEENT:  No scleral icterus.  Conjunctivae are pink.  Oral cavity:  Very few  teeth remain.  Dentition is poor.  No lesions.  NECK:  JVD is not prominent.  CHEST:  Lungs are clear to auscultation.  CARDIOVASCULAR:  Regular rate  and rhythm without murmur, gallop or rub.  ABDOMEN:  Nondistended, obese, positive bowel sounds.  She has minimal  epigastric tenderness.  No succussion splash.  No obvious mass or  organomegaly.  EXTREMITIES:  No edema.   IMPRESSION:  Michaela Peterson is a pleasant 55 year old lady with vague upper  abdominal symptoms including bloating and regurgitation.  She does have a  component of gastroesophageal reflux disease symptoms.   She has some difficulty getting pills to go down, but no difficulties with  other solid food.  I really do not get a sense that she has true esophageal  dysphagia symptoms.   I am concerned about the possibility of gastroesophageal reflux disease  possibly compounded by occult gastroparesis.  Of course, we need to consider  other intraluminal upper GI pathology such as peptic ulcer disease, etc.  It  is good to know she had an unremarkable colonoscopy just one year ago.   RECOMMENDATIONS:  I feel we should go ahead and perform an EGD at this time,  to reassess her upper GI tract, potential risks, benefits, alternatives have  been reviewed.  The potential for esophageal dilation as appropriate have  also been reviewed with Ms. Manson Passey.  Depending on what is found at EGD, I  told Ms. Manson Passey we may end up  getting a solid phase gastric emptying study to further evaluate this nice  lady for potential gastroparesis.  We will plan to perform further  evaluation in the very near future.   I thank Dr. Syliva Overman for letting me see this nice lady today.       RMR/MEDQ  D:  10/05/2004  T:  10/05/2004  Job:  [id number]   cc:   Milus Mallick. Lodema Hong, M.D.  [address]  [place], [place] [address]  Fax: [phone]

## 2010-07-28 NOTE — Op Note (Signed)
NAME:  Michaela Peterson, Michaela Peterson                 ACCOUNT NO.:  [id number]   MEDICAL RECORD NO.:  [id number]          PATIENT TYPE:  AMB   LOCATION:  DAY                           FACILITY:  [facility]   PHYSICIAN:  R. Roetta Sessions, M.D. DATE OF BIRTH:  11-10-1955   DATE OF PROCEDURE:  10/09/2004  DATE OF DISCHARGE:                                 OPERATIVE REPORT   PROCEDURE:  Esophagogastroduodenoscopy with Elease Hashimoto dilation.   INDICATIONS:  The patient is a 55 year old lady with abdominal bloody  regurgitation in the setting of diabetes mellitus.  She has some difficulty  initiating swallowing with pills.  Does not have any typical esophageal  dysphagia symptoms.  An EGD is now being done.  This approach has been  discussed with the patient at length.  The potential risks, benefits and  alternatives have been reviewed, questions answered and she is agreeable.  Please see documentation in the medical record.   DESCRIPTION OF PROCEDURE:  Oxygen saturation, blood pressure, pulse and  respiration were monitored throughout the entire procedure.  Conscious  sedation with Versed 4 mg IV, Demerol 75 mg IV in divided doses.  The  instrument was the Olympus video chip system.   FINDINGS:  Esophagus:  Examination of the tubular esophagus revealed a very  noncritical subtle Schatzki's ring.  The esophageal mucosa otherwise  appeared normal.  UES appeared normal.  The EG junction was easily  traversed.   Stomach:  The gastric cavity had quite a bit of bile-stained mucus within  it.  It insufflated well with air.  A thoroughly examination of the gastric  mucosa included retroflexion into the proximal stomach, esophagogastric  junction demonstrated no abnormalities otherwise.  The pylorus was patent  and easily traversed.  Examination of the bulb and second portion revealed  no abnormalities.   THERAPY AND DIAGNOSTIC MANEUVERS:  The scope was withdrawn.  A 58-French  Maloney dilator was passed to fully easily  and met with no apparent  complications related to passage of the dilator.  The patient tolerated the  procedure well and was reactive after  endoscopy.   IMPRESSION:  1.  Noncritical/subtle Schatzki's ring, status post passage of 58-French      Maloney dilator.  Otherwise normal esophagus.  2.  Bile-stained gastric mucus.  Otherwise normal gastric mucosa.  3.  Normal D1 and D2.   RECOMMENDATIONS:  1.  Will put her on proton pump inhibitor therapy empirically and with      Aciphex 20 mg orally daily.  2.  Will proceed with a __________ gastric emptying study, rule out occult      gastroparesis.  3.  Further recommendations to follow.       RMR/MEDQ  D:  10/09/2004  T:  10/09/2004  Job:  [id number]   cc:   Milus Mallick. Lodema Hong, M.D.  [address]  [place], [place] [address]  Fax: [phone]

## 2010-07-28 NOTE — Procedures (Signed)
NAME:  Michaela Peterson, Michaela Peterson NO.:  [id number]   MEDICAL RECORD NO.:  [id number]          PATIENT TYPE:  REC   LOCATION:                                FACILITY:  [facility]   PHYSICIAN:  Dani Gobble, MD       DATE OF BIRTH:  1955-10-01   DATE OF PROCEDURE:  DATE OF DISCHARGE:                                  ECHOCARDIOGRAM   INDICATION:  A 55 year old patient with past medical history of hypertension  and diabetes who has been experiencing chest pain.   Technical quality of the study is adequate.   The aorta measures normally at 2.6 cm.   The left atrium also measures normally at 3.8 cm.   The interventricular septum and posterior wall are mildly thickened measured  at 1.4 cm and 1.2 cm, respectively.   The aortic valve appears minimally thickened but with normal leaflet  excursion. No significant aortic insufficiency is noted. Doppler  interrogation of the aortic valve is mildly elevated consistent with mild  aortic sclerosis without hemodynamically significant aortic stenosis.   Mitral valve appears grossly structurally normal. No mitral valve prolapse  is noted. Mild mitral regurgitation is noted. Doppler interrogation of the  mitral valve is within normal limits.   The pulmonic valve is not well visualized.   The tricuspid valve appears grossly structurally normal with mild tricuspid  regurgitation noted.   Left ventricle is normal size with LVIDD measured at 4 cm and LVISD measured  at 2.7 cm. Overall left ventricular systolic function is normal. No regional  wall motion abnormalities are noted. The presence of diastolic dysfunction  is inferred from pulsar Doppler across the mitral valve.   The right ventricle is mild to moderately dilated with preserved right  ventricular systolic function. The right atrium is normal size.   IMPRESSION:  1.  Mild concentric left ventricular hypertrophy.  2.  Mild aortic sclerosis without hemodynamic significant aortic  stenosis.  3.  Mild mitral and tricuspid regurgitation.  4.  Normal left ventricular size and systolic function without regional wall      motion abnormality noted.  5.  Presence of diastolic dysfunction is inferred from pulsar Doppler across      the mitral valve.  6.  Mild to moderate right ventricular enlargement with preserved right      ventricular systolic function.           ______________________________  Dani Gobble, MD     AB/MEDQ  D:  05/09/2005  T:  05/10/2005  Job:  [id number]

## 2010-07-28 NOTE — Op Note (Signed)
NAME:  Michaela Peterson, Michaela Peterson NO.:  [id number]   MEDICAL RECORD NO.:  [id number]                   PATIENT TYPE:  AMB   LOCATION:  DSC                                  FACILITY:  [facility]   PHYSICIAN:  Loreta Ave, M.D.              DATE OF BIRTH:  06-02-1955   DATE OF PROCEDURE:  01/14/2002  DATE OF DISCHARGE:                                 OPERATIVE REPORT   PREOPERATIVE DIAGNOSIS:  Degenerative arthritis, chondromalacia of the  patella, and lateral meniscus tear, right knee.   POSTOPERATIVE DIAGNOSIS:  Degenerative arthritis, chondromalacia of the  patella, and lateral meniscus tear, right knee.   PROCEDURES:  1. Right knee examination under anesthesia.  2. Arthroscopy.  3. Chondroplasty of the patellofemoral joint mainly on the trochlea.  4 . Chondroplasty of the medial femoral condyle and lateral tibial plateau.  1. Removal of chondral loose bodies.  2. Partial lateral meniscectomy.   SURGEON:  Loreta Ave, M.D.   ASSISTANT:  Arlys John D. Petrarca, P.A.-C.   ANESTHESIA:  General.   ESTIMATED BLOOD LOSS:  Minimal.   TOURNIQUET:  Not employed.   SPECIMENS:  None.   CULTURES:  None.   COMPLICATIONS:  None.   DRESSING:  Soft compressive.   DESCRIPTION OF PROCEDURE:  Patient brought to the operating room and placed  on the operating table in supine position.  After adequate anesthesia had  been obtained, knee examined.  Full motion and good stability.  Lateral  tracking and increased Q-angle but did not significantly tether.  All  ligaments stable, including ACL.  Tourniquet and leg holder applied.  Leg  prepped and draped in the usual sterile fashion.  Three portals created, one  superolateral, one each medial and lateral parapatellar.  The inflow  catheter introduced, knee distended, arthroscope introduced, and knee  inspected.  Lateral tracking but without tethering of patellofemoral joint.  The patella itself looked good.  Deep  grade 3, almost grade 4 lesion  centrally in the trochlea 12-13 mm in diameter.  Treated with chondroplasty  throughout.  Traces of hyaline cartilage and fibrocartilage at the base, so  drilling not indicated.  Cruciate ligaments were intact.  Medial compartment  revealed deep grade 3 changes with flaps and fissuring.  Articular cartilage  over much of the weightbearing dome.  Chondroplasty to a stable surface,  leaving half thickness of cartilage at completion.  Medial meniscus without  tears.  Medial tibial plateau intact.  Lateral meniscus had frayed flap  tearing, degenerative type, centrally and anterior third.  Saucerized out,  tapered in smoothly.  No other significant tears posteriorly.  Marked grade  3 changes on the plateau with flaps of articular cartilage.  Chondroplasty  to a stable surface.  Reasonable thickness of cartilage left.  The lateral  femoral condyle looked good.  The entire knee examined and no other  significant findings appreciated.  Instruments and fluid removed.  Portals  and knee injected with Marcaine.  Portals closed with 4-0 nylon.  Sterile  compressive dressing applied.  Anesthesia reversed.  Brought to the recovery  room.  Tolerated the surgery well with no complications.                                               Loreta Ave, M.D.    DFM/MEDQ  D:  01/14/2002  T:  01/15/2002  Job:  [id number]

## 2010-07-28 NOTE — Procedures (Signed)
NAMETYCHELLE, PURKEY NO.:  [id number]   MEDICAL RECORD NO.:  [id number]          PATIENT TYPE:  OUT   LOCATION:  SLEEP LAB                     FACILITY:  [facility]   PHYSICIAN:  Marcelyn Bruins, M.D. [facility] DATE OF BIRTH:  May 10, 1955   DATE OF STUDY:  05/21/2005                              NOCTURNAL POLYSOMNOGRAM   REFERRING PHYSICIAN:  Dr. Kem Boroughs.   DATE OF STUDY:  May 21, 2005.   INDICATION FOR STUDY:  Hypersomnia with sleep apnea.   EPWORTH SCORE:  14.   SLEEP ARCHITECTURE:  The patient had a total sleep time of 252 minutes with  decreased slow wave sleep and REM. Sleep onset latency was prolonged at 37  minutes as was REM onset at 226 minutes. Sleep efficiency was decreased to  65%.   RESPIRATORY DATA:  The patient was found to have 136 hypopneas as well as 9  obstructive apneas for a respiratory disturbance index of 35 events per  hour. Events were not positional but mild to moderate snoring was noted  throughout.   OXYGEN DATA:  The patient had O2 desaturation as low as 86% with her  obstructive events.   CARDIAC DATA:  No clinically significant cardiac arrhythmias.   MOVEMENT/PARASOMNIA:  The patient was found to have very small numbers of  leg jerks that were not clinically significant.   IMPRESSION/RECOMMENDATIONS:  Moderate to severe obstructive sleep apnea with  a respiratory disturbance index of 35 events per hour and O2 desaturation as  low as 86%. Treatment for this degree of sleep apnea should focus on weight  loss as well as C-PAP therapy. Other alternatives can be considered,  however, there success rate is quite variable. The patient should also be  instructed to not drive a motor vehicle if she is sleepy.                                           ______________________________  Marcelyn Bruins, M.D. [facility]  Diplomate, American Board of Sleep  Medicine    KC/MEDQ  D:  05/30/2005 11:36:23  T:  05/31/2005 01:37:52  Job:  [id number]

## 2010-07-28 NOTE — Procedures (Signed)
NAMELINDSAY, STRAKA NO.:  [id number]   MEDICAL RECORD NO.:  [id number]         PATIENT TYPE:  REC   LOCATION:                                FACILITY:  [facility]   PHYSICIAN:  Dani Gobble, MD       DATE OF BIRTH:  Dec 08, 1955   DATE OF PROCEDURE:  05/09/2005  DATE OF DISCHARGE:                                    STRESS TEST   INDICATIONS:  Chest discomfort.   ELECTROCARDIOGRAPHIC HEMODYNAMIC DATA:  Baseline blood pressure was 132/74,  pulse 76 beats per minutes.  Based on 12-lead EKG reveals normal sinus  rhythm without significant changes noted.  The patient was infused with  Adenosine per protocol.  The patient experienced chest discomfort described  as heaviness, abdominal discomfort and a sensation in her throat which  resolved by __________ .  She had mild nonspecific ST changes during the  effusion.   IMPRESSION:  1.  EKG negative for ischemia.  2.  Scintigraphic images are pending.           ______________________________  Dani Gobble, MD     AB/MEDQ  D:  05/09/2005  T:  05/09/2005  Job:  [id number]

## 2010-07-31 ENCOUNTER — Ambulatory Visit (HOSPITAL_COMMUNITY)
Admission: RE | Admit: 2010-07-31 | Discharge: 2010-07-31 | Disposition: A | Discharge status: Home / Self Care | Payer: BC Managed Care – PPO | Source: Ambulatory Visit | Attending: Family Medicine | Admitting: Family Medicine

## 2010-07-31 DIAGNOSIS — Z139 Encounter for screening, unspecified: Secondary | ICD-10-CM

## 2010-07-31 DIAGNOSIS — Z1231 Encounter for screening mammogram for malignant neoplasm of breast: Secondary | ICD-10-CM | POA: Insufficient documentation

## 2010-08-22 ENCOUNTER — Other Ambulatory Visit: Payer: Self-pay | Admitting: *Deleted

## 2010-08-22 MED ORDER — SITAGLIPTIN PHOS-METFORMIN HCL 50-1000 MG PO TABS: 1.0000 | ORAL_TABLET | Freq: Two times a day (BID) | ORAL | Status: DC

## 2010-08-28 ENCOUNTER — Telehealth: Payer: Self-pay | Admitting: Family Medicine

## 2010-08-28 NOTE — Telephone Encounter (Signed)
Called patient, left message.

## 2010-08-29 ENCOUNTER — Encounter: Payer: Self-pay | Admitting: Family Medicine

## 2010-08-29 NOTE — Telephone Encounter (Signed)
ADVISED HER CARDIOLOGIST ORDERED LABS AT HER LAST VISIT

## 2010-08-30 ENCOUNTER — Encounter: Payer: Self-pay | Admitting: Family Medicine

## 2010-08-30 ENCOUNTER — Ambulatory Visit (INDEPENDENT_AMBULATORY_CARE_PROVIDER_SITE_OTHER): Payer: BC Managed Care – PPO | Admitting: Family Medicine

## 2010-08-30 VITALS — BP 140/90 | HR 74 | Resp 16 | Ht 61.5 in | Wt 196.1 lb

## 2010-08-30 DIAGNOSIS — Z13820 Encounter for screening for osteoporosis: Secondary | ICD-10-CM

## 2010-08-30 DIAGNOSIS — L0390 Cellulitis, unspecified: Secondary | ICD-10-CM

## 2010-08-30 DIAGNOSIS — I10 Essential (primary) hypertension: Secondary | ICD-10-CM

## 2010-08-30 DIAGNOSIS — L0291 Cutaneous abscess, unspecified: Secondary | ICD-10-CM

## 2010-08-30 DIAGNOSIS — Z23 Encounter for immunization: Secondary | ICD-10-CM

## 2010-08-30 DIAGNOSIS — E669 Obesity, unspecified: Secondary | ICD-10-CM

## 2010-08-30 DIAGNOSIS — E119 Type 2 diabetes mellitus without complications: Secondary | ICD-10-CM

## 2010-08-30 DIAGNOSIS — Z13220 Encounter for screening for lipoid disorders: Secondary | ICD-10-CM

## 2010-08-30 DIAGNOSIS — D509 Iron deficiency anemia, unspecified: Secondary | ICD-10-CM

## 2010-08-30 MED ORDER — FLUCONAZOLE 150 MG PO TABS: 150.0000 mg | ORAL_TABLET | Freq: Once | ORAL | Status: DC

## 2010-08-30 MED ORDER — DOXYCYCLINE HYCLATE 100 MG PO TABS: 100.0000 mg | ORAL_TABLET | Freq: Two times a day (BID) | ORAL | Status: AC

## 2010-08-30 NOTE — Assessment & Plan Note (Signed)
Painful lesions ion scalp intermittently

## 2010-08-30 NOTE — Progress Notes (Signed)
  Subjective:    Patient ID: Michaela Peterson, female    DOB: Mar 21, 1955, 55 y.o.   MRN: [id number]  HPI Hit left foorearm last week, still has tender nodule in the are.  Concerned about januvia and link with pancreatic  Cancer.I m unaware of this, called pharmacy at hosp who is also unaware, but will send any info available. Pt to attempt to improve blood sugars by testing regularly and eating more vegetables, with a weight loss goal  Not testing but will startas well as changing her diett with a goal a goal of d/c januvia in the next 3 month C/o painful bumps in her scalp intermittently x 3 months   Review of Systems Denies recent fever or chills. Denies sinus pressure, nasal congestion, ear pain or sore throat. Denies chest congestion, productive cough or wheezing. Denies chest pains, palpitations, paroxysmal nocturnal dyspnea, orthopnea and leg swelling Denies abdominal pain, nausea, vomiting,diarrhea or constipation.  Denies rectal bleeding or change in bowel movement. Denies dysuria, frequency, hesitancy or incontinence. Denies joint pain, swelling and limitation in mobility. Denies headaches, seizure, numbness, or tingling. Denies depression, anxiety or insomnia.       Objective:   Physical Exam Patient alert and oriented and in no Cardiopulmonary distress.  HEENT: No facial asymmetry, EOMI, no sinus tenderness, TM's clear, Oropharynx pink and moist.  Neck supple no adenopathy.  Chest: Clear to auscultation bilaterally.  CVS: S1, S2 no murmurs, no S3.  ABD: Soft non tender. Bowel sounds normal.  Ext: No edema  MS: Adequate ROM spine, shoulders, hips and knees.  Skin: painful scalp lesions, and cystic nodule on forearm.  Psych: Good eye contact, normal affect. Memory intact not anxious or depressed appearing.  CNS: CN 2-12 intact, power, tone and sensation normal throughout.        Assessment & Plan:

## 2010-08-30 NOTE — Assessment & Plan Note (Signed)
Controlled, no change in medication  

## 2010-08-30 NOTE — Assessment & Plan Note (Signed)
Improved, rept lab today, pt to try to wean off januvia due to safety concerns

## 2010-08-30 NOTE — Patient Instructions (Signed)
F/u in 3.5 months.  It is important that you exercise regularly at least 30 minutes 5 times a week. If you develop chest pain, have severe difficulty breathing, or feel very tired, stop exercising immediately and seek medical attention    A healthy diet is rich in fruit, vegetables and whole grains. Poultry fish, nuts and beans are a healthy choice for protein rather then red meat. A low sodium diet and drinking 64 ounces of water daily is generally recommended. Oils and sweet should be limited. Carbohydrates especially for those who are diabetic or overweight, should be limited to 30-45 gram per meal. It is important to eat on a regular schedule, at least 3 times daily. Snacks should be primarily fruits, vegetables or nuts.  Fasting labs today.  hBA1c and chem 7 in 3.5 months.  Med is sent in for the bumps on your scalp

## 2010-08-30 NOTE — Assessment & Plan Note (Signed)
Improved. Pt applauded on succesful weight loss through lifestyle change, and encouraged to continue same. Weight loss goal set for the next several months.  

## 2010-08-31 LAB — HEMOGLOBIN A1C
Hgb A1c MFr Bld: 7.2 % — ABNORMAL HIGH (ref <5.7)
Mean Plasma Glucose: 160 mg/dL — ABNORMAL HIGH (ref <117)

## 2010-08-31 LAB — CBC WITH DIFFERENTIAL/PLATELET
Basophils Absolute: 0.1 10*3/uL (ref 0.0–0.1)
Basophils Relative: 1 % (ref 0–1)
Eosinophils Absolute: 0.1 10*3/uL (ref 0.0–0.7)
Eosinophils Relative: 1 % (ref 0–5)
HCT: 33.6 % — ABNORMAL LOW (ref 36.0–46.0)
Hemoglobin: 10.7 g/dL — ABNORMAL LOW (ref 12.0–15.0)
Lymphocytes Relative: 33 % (ref 12–46)
Lymphs Abs: 3.1 10*3/uL (ref 0.7–4.0)
MCH: 24.2 pg — ABNORMAL LOW (ref 26.0–34.0)
MCHC: 31.8 g/dL (ref 30.0–36.0)
MCV: 75.8 fL — ABNORMAL LOW (ref 78.0–100.0)
Monocytes Absolute: 0.4 10*3/uL (ref 0.1–1.0)
Monocytes Relative: 5 % (ref 3–12)
Neutro Abs: 5.8 10*3/uL (ref 1.7–7.7)
Neutrophils Relative %: 62 % (ref 43–77)
Platelets: 527 10*3/uL — ABNORMAL HIGH (ref 150–400)
RBC: 4.43 MIL/uL (ref 3.87–5.11)
RDW: 16.8 % — ABNORMAL HIGH (ref 11.5–15.5)
WBC: 9.4 10*3/uL (ref 4.0–10.5)

## 2010-08-31 LAB — LIPID PANEL
Cholesterol: 170 mg/dL (ref 0–200)
HDL: 53 mg/dL (ref >39)
LDL Cholesterol: 105 mg/dL — ABNORMAL HIGH (ref 0–99)
Total CHOL/HDL Ratio: 3.2 Ratio
Triglycerides: 59 mg/dL (ref <150)
VLDL: 12 mg/dL (ref 0–40)

## 2010-09-01 LAB — COMPLETE METABOLIC PANEL WITH GFR
ALT: 21 U/L (ref 0–35)
AST: 21 U/L (ref 0–37)
Albumin: 4.5 g/dL (ref 3.5–5.2)
Alkaline Phosphatase: 80 U/L (ref 39–117)
BUN: 16 mg/dL (ref 6–23)
CO2: 22 mEq/L (ref 19–32)
Calcium: 10 mg/dL (ref 8.4–10.5)
Chloride: 105 mEq/L (ref 96–112)
Creat: 0.78 mg/dL (ref 0.50–1.10)
GFR, Est African American: >60 mL/min (ref >60)
GFR, Est Non African American: >60 mL/min (ref >60)
Glucose, Bld: 101 mg/dL — ABNORMAL HIGH (ref 70–99)
Potassium: 4.6 mEq/L (ref 3.5–5.3)
Sodium: 139 mEq/L (ref 135–145)
Total Bilirubin: 0.4 mg/dL (ref 0.3–1.2)
Total Protein: 7.8 g/dL (ref 6.0–8.3)

## 2010-09-01 LAB — MICROALBUMIN / CREATININE URINE RATIO
Creatinine, Urine: 199.2 mg/dL
Microalb Creat Ratio: 3.6 mg/g (ref 0.0–30.0)
Microalb, Ur: 0.72 mg/dL (ref 0.00–1.89)

## 2010-09-02 LAB — VITAMIN D 1,25 DIHYDROXY
Vitamin D 1, 25 (OH)2 Total: 61 pg/mL (ref 18–72)
Vitamin D2 1, 25 (OH)2: <8 pg/mL
Vitamin D3 1, 25 (OH)2: 61 pg/mL

## 2010-09-05 ENCOUNTER — Other Ambulatory Visit: Payer: Self-pay | Admitting: Family Medicine

## 2010-11-22 ENCOUNTER — Other Ambulatory Visit: Payer: Self-pay | Admitting: Family Medicine

## 2010-12-05 ENCOUNTER — Other Ambulatory Visit: Payer: Self-pay | Admitting: Family Medicine

## 2010-12-18 ENCOUNTER — Ambulatory Visit (INDEPENDENT_AMBULATORY_CARE_PROVIDER_SITE_OTHER): Payer: BC Managed Care – PPO | Admitting: Family Medicine

## 2010-12-18 ENCOUNTER — Encounter: Payer: Self-pay | Admitting: Family Medicine

## 2010-12-18 VITALS — BP 130/78 | HR 78 | Resp 16 | Ht 61.5 in | Wt 196.1 lb

## 2010-12-18 DIAGNOSIS — J209 Acute bronchitis, unspecified: Secondary | ICD-10-CM | POA: Insufficient documentation

## 2010-12-18 DIAGNOSIS — J309 Allergic rhinitis, unspecified: Secondary | ICD-10-CM

## 2010-12-18 DIAGNOSIS — R21 Rash and other nonspecific skin eruption: Secondary | ICD-10-CM

## 2010-12-18 MED ORDER — AZITHROMYCIN 250 MG PO TABS: ORAL_TABLET | ORAL | Status: AC

## 2010-12-18 MED ORDER — GUAIFENESIN-CODEINE 100-10 MG/5ML PO SYRP: 5.0000 mL | ORAL_SOLUTION | Freq: Three times a day (TID) | ORAL | Status: AC | PRN

## 2010-12-18 MED ORDER — ANTIPYRINE-BENZOCAINE 55-14 MG/ML OT SOLN: 1.0000 [drp] | Freq: Three times a day (TID) | OTIC | Status: DC | PRN

## 2010-12-18 MED ORDER — OLOPATADINE HCL 0.6 % NA SOLN: NASAL | Status: DC

## 2010-12-18 NOTE — Assessment & Plan Note (Signed)
Zpak given, pt is diabetic, non smoker but exposed to second hand smoke Cough medication Given red flags, if no improvement- CXR

## 2010-12-18 NOTE — Progress Notes (Signed)
  Subjective:    Patient ID: Michaela Peterson, female    DOB: 01-16-1956, 55 y.o.   MRN: [id number]  HPI  1. Cough productive, started initially as sore sore throat 2 weeks ago, cough started 8 days ago,+ ear pain, allergies, unclear of fever, +chills, no bodyaches, has not received flu shot, received pneumonia shot , +sick contacts   Using OTC Mucinex and tussin which has not helped much, out of her nasal spray   2.Rash- has had a  few skin lesions on face and abdomen and scalp- currently using bactroban which dries them up, no pus expressed from lesions, told they were boils, uses Sulfer 8 products  ROS- When laying flat has SOB, has sleep apnea, no Chest pain, no N/V, no change in stools+ feels drainage down throat    Review of Systems - per above     Objective:   Physical Exam GEN- NAD, alert and oriented x3, fatigued appearing HEENT- PERRL, EOMI, non injected sclera, pink conjunctiva, MMM, erythema of posterior oropharynx, no exudate, TM clear bilat, no effusion Neck- Supple,  Nodes- small shotty nodes along anterior cervical chain and submandibular region, mild TTP CVS- RRR, no murmur RESP-slightly decreased at bases, no wheeze, no rhonchi, harsh cough EXT- No edema Pulses- Radial, DP- 2+ Skin- small fluid filled cyst on right temple, dry crusted lesion in posterior occipital region, well healed scars on abdomen and chest, no erythema       Assessment & Plan:

## 2010-12-18 NOTE — Patient Instructions (Signed)
I am treating you for bronchitis  Take the antibiotics-start today Use the cough medication as needed Restart the nasal spray If you are not better in 1 week please call.  If you have worse shortness of breath, chest pain or high fever > 101F that wont go down with tylenol please call

## 2010-12-18 NOTE — Assessment & Plan Note (Signed)
Refilled Pantanase, pt has post nasal drip at this time

## 2010-12-18 NOTE — Assessment & Plan Note (Signed)
Based on location of lesions likley boils on trunk. Possible form of acne on face and post scalp line. Continue bactroban as this dries lesions up. If no improvement or they return consider derm referral

## 2010-12-28 ENCOUNTER — Telehealth: Payer: Self-pay | Admitting: Family Medicine

## 2010-12-28 MED ORDER — DOXYCYCLINE HYCLATE 100 MG PO TABS: 100.0000 mg | ORAL_TABLET | Freq: Two times a day (BID) | ORAL | Status: AC

## 2010-12-28 NOTE — Telephone Encounter (Signed)
Patient aware.

## 2010-12-28 NOTE — Telephone Encounter (Addendum)
I treated her for bronchitis on Oct 8th, is she still having cough that is productive because that is different for her sinus- which she has nasal spray. The antibiotics should have been finished after 5 days. Can you clarify??  I have sent doxycyline. If she is still having productive cough she needs a 2 view CXR.

## 2010-12-28 NOTE — Telephone Encounter (Signed)
Called patient left message

## 2011-01-02 ENCOUNTER — Encounter: Payer: Self-pay | Admitting: Family Medicine

## 2011-01-03 ENCOUNTER — Encounter: Payer: Self-pay | Admitting: Family Medicine

## 2011-01-03 ENCOUNTER — Ambulatory Visit (INDEPENDENT_AMBULATORY_CARE_PROVIDER_SITE_OTHER): Payer: BC Managed Care – PPO | Admitting: Family Medicine

## 2011-01-03 VITALS — BP 128/82 | HR 64 | Resp 16 | Ht 61.5 in | Wt 197.4 lb

## 2011-01-03 DIAGNOSIS — E785 Hyperlipidemia, unspecified: Secondary | ICD-10-CM

## 2011-01-03 DIAGNOSIS — I10 Essential (primary) hypertension: Secondary | ICD-10-CM

## 2011-01-03 DIAGNOSIS — J309 Allergic rhinitis, unspecified: Secondary | ICD-10-CM

## 2011-01-03 DIAGNOSIS — E669 Obesity, unspecified: Secondary | ICD-10-CM

## 2011-01-03 DIAGNOSIS — H9209 Otalgia, unspecified ear: Secondary | ICD-10-CM | POA: Insufficient documentation

## 2011-01-03 DIAGNOSIS — E119 Type 2 diabetes mellitus without complications: Secondary | ICD-10-CM

## 2011-01-03 LAB — BASIC METABOLIC PANEL
BUN: 13 mg/dL (ref 6–23)
CO2: 23 mEq/L (ref 19–32)
Calcium: 9.5 mg/dL (ref 8.4–10.5)
Chloride: 104 mEq/L (ref 96–112)
Creat: 0.73 mg/dL (ref 0.50–1.10)
Glucose, Bld: 84 mg/dL (ref 70–99)
Potassium: 4.4 mEq/L (ref 3.5–5.3)
Sodium: 139 mEq/L (ref 135–145)

## 2011-01-03 LAB — HEMOGLOBIN A1C
Hgb A1c MFr Bld: 6.8 % — ABNORMAL HIGH (ref <5.7)
Mean Plasma Glucose: 148 mg/dL — ABNORMAL HIGH (ref <117)

## 2011-01-03 NOTE — Patient Instructions (Addendum)
F/U in 3.5 months.  A healthy diet is rich in fruit, vegetables and whole grains. Poultry fish, nuts and beans are a healthy choice for protein rather then red meat. A low sodium diet and drinking 64 ounces of water daily is generally recommended. Oils and sweet should be limited. Carbohydrates especially for those who are diabetic or overweight, should be limited to 30-45 gram per meal. It is important to eat on a regular schedule, at least 3 times daily. Snacks should be primarily fruits, vegetables or nuts.   It is important that you exercise regularly at least 30 minutes 5 times a week. If you develop chest pain, have severe difficulty breathing, or feel very tired, stop exercising immediately and seek medical attention   LABWORK  NEEDS TO BE DONE BETWEEN 3 TO 7 DAYS BEFORE YOUR NEXT SCEDULED  VISIT.  THIS WILL IMPROVE THE QUALITY OF YOUR CARE.  CMP and EGFR , hBa1C, lipid in 3.5 month, target weight loss of 6 pounds Return for flu vaccine in 2 weeks  You are referred to ENT for ear pain and dizziness  You are being given 2 scripts, fill only ONE, either generic claritin or singulair  Info on how to make saline nasal flushes, and the [facility] low carb diet info is being given

## 2011-01-04 ENCOUNTER — Telehealth: Payer: Self-pay | Admitting: Family Medicine

## 2011-01-04 NOTE — Telephone Encounter (Signed)
Patient is aware of the appointment. 

## 2011-01-04 NOTE — Telephone Encounter (Signed)
Pt was called due to teoh office calling back and changed her appt again back to 01/11/2011 4:00. Pt is aware

## 2011-01-17 ENCOUNTER — Ambulatory Visit (INDEPENDENT_AMBULATORY_CARE_PROVIDER_SITE_OTHER): Payer: BC Managed Care – PPO

## 2011-01-17 DIAGNOSIS — Z23 Encounter for immunization: Secondary | ICD-10-CM

## 2011-01-18 ENCOUNTER — Ambulatory Visit (INDEPENDENT_AMBULATORY_CARE_PROVIDER_SITE_OTHER): Payer: BC Managed Care – PPO | Admitting: Otolaryngology

## 2011-02-02 DIAGNOSIS — E785 Hyperlipidemia, unspecified: Secondary | ICD-10-CM | POA: Insufficient documentation

## 2011-02-02 NOTE — Assessment & Plan Note (Signed)
Controlled, no change in medication  

## 2011-02-02 NOTE — Assessment & Plan Note (Signed)
Uncontrolled, low fat diet discussed and encouraged. No med change

## 2011-02-02 NOTE — Assessment & Plan Note (Signed)
Increased symptoms, pt to start saline flushes, info provided on how to make these

## 2011-02-02 NOTE — Assessment & Plan Note (Signed)
Markedly improved, no med change, encouraged pt to be more diligent with diet

## 2011-02-02 NOTE — Progress Notes (Signed)
  Subjective:    Patient ID: Michaela Peterson, female    DOB: 30-Aug-1955, 55 y.o.   MRN: [id number]  HPI The PT is here for follow up and re-evaluation of chronic medical conditions, medication management and review of any available recent lab and radiology data.  Preventive health is updated, specifically  Cancer screening and Immunization.   Questions or concerns regarding consultations or procedures which the PT has had in the interim are  addressed. The PT denies any adverse reactions to current medications since the last visit.  There are no new concerns.  C/o increased and uncontrolled allergy symptoms x 2 weeks. Nasal congestion with clear drainage. Denies polyuria, polysipsia, blurred vision or hypoglycemic episodes. Fasting blood sugars are seldom over 140    Review of Systems See HPI Denies recent fever or chills. Denies ear pain or sore throat. Denies chest congestion, productive cough or wheezing. Denies chest pains, palpitations and leg swelling Denies abdominal pain, nausea, vomiting,diarrhea or constipation.   Denies dysuria, frequency, hesitancy or incontinence. Denies joint pain, swelling and limitation in mobility. Denies headaches, seizures, numbness, or tingling. Denies depression, anxiety or insomnia. Denies skin break down or rash.        Objective:   Physical Exam Patient alert and oriented and in no cardiopulmonary distress.  HEENT: No facial asymmetry, EOMI, no sinus tenderness,  oropharynx pink and moist.  Neck supple no adenopathy.  Chest: Clear to auscultation bilaterally.  CVS: S1, S2 no murmurs, no S3.  ABD: Soft non tender. Bowel sounds normal.  Ext: No edema  MS: Adequate ROM spine, shoulders, hips and knees.  Skin: Intact, no ulcerations or rash noted.  Psych: Good eye contact, normal affect. Memory intact not anxious or depressed appearing.  CNS: CN 2-12 intact, power, tone and sensation normal throughout.        Assessment &  Plan:

## 2011-02-02 NOTE — Assessment & Plan Note (Signed)
Unchanged. Patient re-educated about  the importance of commitment to a  minimum of 150 minutes of exercise per week. The importance of healthy food choices with portion control discussed. Encouraged to start a food diary, count calories and to consider  joining a support group. Sample diet sheets offered. Goals set by the patient for the next several months.    

## 2011-03-27 ENCOUNTER — Other Ambulatory Visit: Payer: Self-pay | Admitting: Family Medicine

## 2011-04-18 ENCOUNTER — Other Ambulatory Visit: Payer: Self-pay | Admitting: Family Medicine

## 2011-05-01 ENCOUNTER — Telehealth: Payer: Self-pay | Admitting: Family Medicine

## 2011-05-01 NOTE — Telephone Encounter (Signed)
Left message for pt and faxed the order for fasting bloodwork to the lab

## 2011-05-02 ENCOUNTER — Ambulatory Visit (INDEPENDENT_AMBULATORY_CARE_PROVIDER_SITE_OTHER): Payer: BC Managed Care – PPO | Admitting: Family Medicine

## 2011-05-02 ENCOUNTER — Encounter: Payer: Self-pay | Admitting: Family Medicine

## 2011-05-02 VITALS — BP 140/80 | HR 75 | Resp 18 | Ht 61.5 in | Wt 200.0 lb

## 2011-05-02 DIAGNOSIS — J309 Allergic rhinitis, unspecified: Secondary | ICD-10-CM

## 2011-05-02 DIAGNOSIS — I10 Essential (primary) hypertension: Secondary | ICD-10-CM

## 2011-05-02 DIAGNOSIS — G35 Multiple sclerosis: Secondary | ICD-10-CM

## 2011-05-02 DIAGNOSIS — E119 Type 2 diabetes mellitus without complications: Secondary | ICD-10-CM

## 2011-05-02 DIAGNOSIS — M940 Chondrocostal junction syndrome [Tietze]: Secondary | ICD-10-CM

## 2011-05-02 DIAGNOSIS — E785 Hyperlipidemia, unspecified: Secondary | ICD-10-CM

## 2011-05-02 LAB — LIPID PANEL
Cholesterol: 169 mg/dL (ref 0–200)
HDL: 59 mg/dL (ref >39)
LDL Cholesterol: 100 mg/dL — ABNORMAL HIGH (ref 0–99)
Total CHOL/HDL Ratio: 2.9 Ratio
Triglycerides: 51 mg/dL (ref <150)
VLDL: 10 mg/dL (ref 0–40)

## 2011-05-02 LAB — COMPLETE METABOLIC PANEL WITH GFR
ALT: 26 U/L (ref 0–35)
AST: 20 U/L (ref 0–37)
Albumin: 4 g/dL (ref 3.5–5.2)
Alkaline Phosphatase: 74 U/L (ref 39–117)
BUN: 10 mg/dL (ref 6–23)
CO2: 24 mEq/L (ref 19–32)
Calcium: 9.1 mg/dL (ref 8.4–10.5)
Chloride: 105 mEq/L (ref 96–112)
Creat: 0.73 mg/dL (ref 0.50–1.10)
GFR, Est African American: >89 mL/min (ref >90)
GFR, Est Non African American: >89 mL/min (ref >90)
Glucose, Bld: 83 mg/dL (ref 70–99)
Potassium: 4 mEq/L (ref 3.5–5.3)
Sodium: 136 mEq/L (ref 135–145)
Total Bilirubin: 0.4 mg/dL (ref 0.3–1.2)
Total Protein: 6.9 g/dL (ref 6.0–8.3)

## 2011-05-02 LAB — HEMOGLOBIN A1C
Hgb A1c MFr Bld: 6.7 % — ABNORMAL HIGH (ref <5.7)
Mean Plasma Glucose: 146 mg/dL — ABNORMAL HIGH (ref <117)

## 2011-05-02 MED ORDER — PANTOPRAZOLE SODIUM 40 MG PO TBEC: 40.0000 mg | DELAYED_RELEASE_TABLET | Freq: Every day | ORAL | Status: DC

## 2011-05-02 MED ORDER — BENAZEPRIL-HYDROCHLOROTHIAZIDE 20-12.5 MG PO TABS: 2.0000 | ORAL_TABLET | Freq: Every day | ORAL | Status: DC

## 2011-05-02 MED ORDER — NIFEDIPINE ER OSMOTIC RELEASE 90 MG PO TB24: 90.0000 mg | ORAL_TABLET | Freq: Every day | ORAL | Status: DC

## 2011-05-02 MED ORDER — GLIPIZIDE 10 MG PO TABS: 10.0000 mg | ORAL_TABLET | Freq: Two times a day (BID) | ORAL | Status: DC

## 2011-05-02 NOTE — Assessment & Plan Note (Signed)
Uncontrolled due to non compliance, pt to start daily med and saline flush

## 2011-05-02 NOTE — Assessment & Plan Note (Signed)
Sub optimal control,. No med change at this time however

## 2011-05-02 NOTE — Assessment & Plan Note (Signed)
Non compliant with statin but good numbers, will use 3 times weekly

## 2011-05-02 NOTE — Assessment & Plan Note (Signed)
Controlled, no change in medication Concerned about cost of janumet, will ask pt to price kombiglyze

## 2011-05-02 NOTE — Assessment & Plan Note (Signed)
1 week h/o right chest wall pain aggravated by movement, pt to take ibuprofen OTC 200mg  one twice daily for 1 week, and stop exercise

## 2011-05-02 NOTE — Patient Instructions (Signed)
F/U in 4 month, end June  CBC, HBA1C, TSH  And chem 7 in 4 month, non fasting, Microalb, June 20 or after  It is important that you exercise regularly at least 30 minutes 5 times a week. If you develop chest pain, have severe difficulty breathing, or feel very tired, stop exercising immediately and seek medical attention    A healthy diet is rich in fruit, vegetables and whole grains. Poultry fish, nuts and beans are a healthy choice for protein rather then red meat. A low sodium diet and drinking 64 ounces of water daily is generally recommended. Oils and sweet should be limited. Carbohydrates especially for those who are diabetic or overweight, should be limited to 34-45 gram per meal. It is important to eat on a regular schedule, at least 3 times daily. Snacks should be primarily fruits, vegetables or nuts.  Pls check the cost of the janumet  And kombiglyze, we will provide coupons for both so you can see if there is a big price difference  Pls lower sodium intake , and use the CPAP machine

## 2011-05-07 ENCOUNTER — Telehealth: Payer: Self-pay | Admitting: Family Medicine

## 2011-05-07 NOTE — Telephone Encounter (Signed)
None on past or present med list that I can see, advise keigel exercises,[id number] she is having excessive symptoms and wants to try med let me know. If she can hold till next visit to start med that's better.

## 2011-05-07 NOTE — Assessment & Plan Note (Signed)
Asymptomatic , followed by neurology 

## 2011-05-07 NOTE — Telephone Encounter (Signed)
[phone] call let her know

## 2011-05-07 NOTE — Telephone Encounter (Signed)
Is she supposed to be taking urinary incontinence med?

## 2011-05-07 NOTE — Progress Notes (Signed)
  Subjective:    Patient ID: Michaela Peterson, female    DOB: 1955-10-05, 56 y.o.   MRN: [id number]  HPI The PT is here for follow up and re-evaluation of chronic medical conditions, medication management and review of any available recent lab and radiology data.  Preventive health is updated, specifically  Cancer screening and Immunization.   Questions or concerns regarding consultations or procedures which the PT has had in the interim are  addressed. The PT denies any adverse reactions to current medications since the last visit.  1 week h/o chest wall pain aggravated by movement. C/o increased nasal congestion with clear drainage, no fever or chills, no productive cough. Denies polyuria, polydipsia or blurred vision. Fasting sugars are seldom over 110    Review of Systems See HPI Denies recent fever or chills. Denies sinus pressure, nasal congestion, ear pain or sore throat. Denies chest congestion, productive cough or wheezing. Denies  palpitations and leg swelling Denies abdominal pain, nausea, vomiting,diarrhea or constipation.   Denies dysuria, frequency, hesitancy or incontinence. Denies joint pain, swelling and limitation in mobility. Denies headaches, seizures, numbness, or tingling. Denies depression, anxiety or insomnia. Denies skin break down or rash.        Objective:   Physical Exam Patient alert and oriented and in no cardiopulmonary distress.  HEENT: No facial asymmetry, EOMI, no sinus tenderness,  oropharynx pink and moist.  Neck supple no adenopathy.  Chest: Clear to auscultation bilaterally.  CVS: S1, S2 no murmurs, no S3.  ABD: Soft non tender. Bowel sounds normal.  Ext: No edema  MS: Adequate ROM spine, shoulders, hips and knees.  Skin: Intact, no ulcerations or rash noted.  Psych: Good eye contact, normal affect. Memory intact not anxious or depressed appearing.  CNS: CN 2-12 intact, power, tone and sensation normal throughout.          Assessment & Plan:

## 2011-05-10 NOTE — Telephone Encounter (Signed)
She was needed something for allergies but she has been taking benadryl and is better now

## 2011-05-11 NOTE — Progress Notes (Signed)
  Subjective:    Patient ID: Michaela Peterson, female    DOB: 11-Jan-1956, 56 y.o.   MRN: [id number]  HPI    Review of Systems     Objective:   Physical Exam Diabetic Foot Check:  Appearance -  calluses Skin - no unusual pallor or redness Sensation - grossly intact to light touch Monofilament testing -  Right - Great toe, medial, central, lateral ball and posterior foot diminished Left - Great toe, medial, central, lateral ball and posterior foot diminished Pulses Left - Dorsalis Pedis and Posterior Tibia diminished Right - Dorsalis Pedis and Posterior Tibia diminished        Assessment & Plan:

## 2011-07-02 ENCOUNTER — Other Ambulatory Visit: Payer: Self-pay | Admitting: Family Medicine

## 2011-07-02 DIAGNOSIS — Z139 Encounter for screening, unspecified: Secondary | ICD-10-CM

## 2011-08-06 ENCOUNTER — Ambulatory Visit (HOSPITAL_COMMUNITY): Payer: BC Managed Care – PPO

## 2011-08-07 ENCOUNTER — Ambulatory Visit (HOSPITAL_COMMUNITY): Payer: BC Managed Care – PPO

## 2011-08-13 ENCOUNTER — Ambulatory Visit (HOSPITAL_COMMUNITY)
Admission: RE | Admit: 2011-08-13 | Discharge: 2011-08-13 | Disposition: A | Discharge status: Home / Self Care | Payer: BC Managed Care – PPO | Source: Ambulatory Visit | Attending: Family Medicine | Admitting: Family Medicine

## 2011-08-13 DIAGNOSIS — Z139 Encounter for screening, unspecified: Secondary | ICD-10-CM

## 2011-08-13 DIAGNOSIS — Z1231 Encounter for screening mammogram for malignant neoplasm of breast: Secondary | ICD-10-CM | POA: Insufficient documentation

## 2011-08-31 LAB — BASIC METABOLIC PANEL
BUN: 14 mg/dL (ref 6–23)
CO2: 25 mEq/L (ref 19–32)
Calcium: 9.7 mg/dL (ref 8.4–10.5)
Chloride: 104 mEq/L (ref 96–112)
Creat: 0.66 mg/dL (ref 0.50–1.10)
Glucose, Bld: 112 mg/dL — ABNORMAL HIGH (ref 70–99)
Potassium: 4.3 mEq/L (ref 3.5–5.3)
Sodium: 140 mEq/L (ref 135–145)

## 2011-08-31 LAB — CBC
HCT: 34.8 % — ABNORMAL LOW (ref 36.0–46.0)
Hemoglobin: 11.4 g/dL — ABNORMAL LOW (ref 12.0–15.0)
MCH: 24.1 pg — ABNORMAL LOW (ref 26.0–34.0)
MCHC: 32.8 g/dL (ref 30.0–36.0)
MCV: 73.6 fL — ABNORMAL LOW (ref 78.0–100.0)
Platelets: 498 10*3/uL — ABNORMAL HIGH (ref 150–400)
RBC: 4.73 MIL/uL (ref 3.87–5.11)
RDW: 17.4 % — ABNORMAL HIGH (ref 11.5–15.5)
WBC: 10.3 10*3/uL (ref 4.0–10.5)

## 2011-08-31 LAB — TSH: TSH: 2.97 u[IU]/mL (ref 0.350–4.500)

## 2011-09-01 LAB — HEMOGLOBIN A1C
Hgb A1c MFr Bld: 7.3 % — ABNORMAL HIGH (ref <5.7)
Mean Plasma Glucose: 163 mg/dL — ABNORMAL HIGH (ref <117)

## 2011-09-01 LAB — MICROALBUMIN / CREATININE URINE RATIO
Creatinine, Urine: 107.5 mg/dL
Microalb Creat Ratio: 5.2 mg/g (ref 0.0–30.0)
Microalb, Ur: 0.56 mg/dL (ref 0.00–1.89)

## 2011-09-03 ENCOUNTER — Ambulatory Visit (INDEPENDENT_AMBULATORY_CARE_PROVIDER_SITE_OTHER): Payer: BC Managed Care – PPO | Admitting: Family Medicine

## 2011-09-03 ENCOUNTER — Encounter: Payer: Self-pay | Admitting: Family Medicine

## 2011-09-03 VITALS — BP 130/80 | HR 68 | Resp 18 | Ht 61.5 in | Wt 201.1 lb

## 2011-09-03 DIAGNOSIS — E669 Obesity, unspecified: Secondary | ICD-10-CM

## 2011-09-03 DIAGNOSIS — E119 Type 2 diabetes mellitus without complications: Secondary | ICD-10-CM

## 2011-09-03 DIAGNOSIS — G35 Multiple sclerosis: Secondary | ICD-10-CM

## 2011-09-03 DIAGNOSIS — N926 Irregular menstruation, unspecified: Secondary | ICD-10-CM

## 2011-09-03 DIAGNOSIS — E785 Hyperlipidemia, unspecified: Secondary | ICD-10-CM

## 2011-09-03 DIAGNOSIS — I10 Essential (primary) hypertension: Secondary | ICD-10-CM

## 2011-09-03 MED ORDER — MONTELUKAST SODIUM 10 MG PO TABS: 10.0000 mg | ORAL_TABLET | Freq: Every day | ORAL | Status: DC

## 2011-09-03 NOTE — Progress Notes (Signed)
  Subjective:    Patient ID: Michaela Peterson, female    DOB: 21-Aug-1955, 56 y.o.   MRN: [id number]  HPI The PT is here for follow up and re-evaluation of chronic medical conditions, medication management and review of any available recent lab and radiology data.  Preventive health is updated, specifically  Cancer screening and Immunization.   Questions or concerns regarding consultations or procedures which the PT has had in the interim are  addressed. The PT denies any adverse reactions to current medications since the last visit.  There are no new concerns. Has not been good with diet, increased sugar intake, exercise also inconsistent Still experiencing irregular menstrual bleeding and is lost to gyne follow up      Review of Systems See HPI Denies recent fever or chills. Denies sinus pressure, nasal congestion, ear pain or sore throat.Increased allergy symptoms x 1 month Denies chest congestion, productive cough or wheezing. Denies chest pains, palpitations and leg swelling Denies abdominal pain, nausea, vomiting,diarrhea or constipation.   Denies dysuria, frequency, hesitancy or incontinence. Denies uncontrolled  joint pain, swelling and limitation in mobility. Denies headaches, seizures, numbness, or tingling. Denies depression, anxiety or insomnia. Denies skin break down or rash.Tick recently removed from toes, still irtches, not painful and no purulent drainage        Objective:   Physical Exam Patient alert and oriented and in no cardiopulmonary distress.  HEENT: No facial asymmetry, EOMI, no sinus tenderness,  oropharynx pink and moist.  Neck supple no adenopathy.  Chest: Clear to auscultation bilaterally.  CVS: S1, S2 no murmurs, no S3.  ABD: Soft non tender. Bowel sounds normal.  Ext: No edema  MS: Adequate ROM spine, shoulders, hips and knees.  Skin: Intact, no ulcerations or rash noted.  Psych: Good eye contact, normal affect. Memory intact not anxious or  depressed appearing.  CNS: CN 2-12 intact, power, tone and sensation normal throughout. Diabetic Foot Check:  Appearance - no lesions, ulcers or calluses Skin - no unusual pallor or redness Sensation - grossly intact to light touch Monofilament testing -  Right - Great toe, medial, central, lateral ball and posterior foot intact Left - Great toe, medial, central, lateral ball and posterior foot intact Pulses Left - Dorsalis Pedis and Posterior Tibia normal Right - Dorsalis Pedis and Posterior Tibia normal        Assessment & Plan:

## 2011-09-03 NOTE — Patient Instructions (Addendum)
F/u in 4 month  Your blood sugar is not as good as it was, you need to cut back/cut out the "sweets" you have been eating.One serving per two weeks the most.  Continue to commit to regular exercise   Blood pressure is excellent  Pelvic US will be scheduled for October for irregular bleeding  HBa1C , fasting lipid and cmp and EGFr in 4 months  Use sudafed one daily for 3 to 5 days, then , as needed for sinus pressure.  Eye exam in September

## 2011-09-03 NOTE — Assessment & Plan Note (Signed)
Unchanged. Patient re-educated about  the importance of commitment to a  minimum of 150 minutes of exercise per week. The importance of healthy food choices with portion control discussed. Encouraged to start a food diary, count calories and to consider  joining a support group. Sample diet sheets offered. Goals set by the patient for the next several months.    

## 2011-09-03 NOTE — Assessment & Plan Note (Signed)
deriorated and uncontrolled. No med changes, just better dietary control

## 2011-09-03 NOTE — Assessment & Plan Note (Signed)
Asymptomatic , followed by neurology

## 2011-09-03 NOTE — Assessment & Plan Note (Signed)
Elevated LDL, counseled re low fat diet, updated labs next visit, no med change

## 2011-09-03 NOTE — Assessment & Plan Note (Signed)
Controlled, no change in medication  

## 2011-09-03 NOTE — Assessment & Plan Note (Signed)
Ongoing problem needs f/u pelvic US, currently lost to gyne follow up

## 2011-09-04 ENCOUNTER — Other Ambulatory Visit: Payer: Self-pay | Admitting: Family Medicine

## 2011-09-14 ENCOUNTER — Telehealth: Payer: Self-pay | Admitting: Family Medicine

## 2011-09-14 NOTE — Telephone Encounter (Signed)
error 

## 2011-09-18 ENCOUNTER — Ambulatory Visit (HOSPITAL_COMMUNITY)
Admission: RE | Admit: 2011-09-18 | Discharge: 2011-09-18 | Disposition: A | Discharge status: Home / Self Care | Payer: BC Managed Care – PPO | Source: Ambulatory Visit | Attending: Family Medicine | Admitting: Family Medicine

## 2011-09-18 ENCOUNTER — Other Ambulatory Visit: Payer: Self-pay | Admitting: Family Medicine

## 2011-09-18 DIAGNOSIS — N926 Irregular menstruation, unspecified: Secondary | ICD-10-CM

## 2011-09-18 DIAGNOSIS — D251 Intramural leiomyoma of uterus: Secondary | ICD-10-CM | POA: Insufficient documentation

## 2011-09-27 ENCOUNTER — Telehealth: Payer: Self-pay | Admitting: Family Medicine

## 2011-09-27 NOTE — Telephone Encounter (Signed)
Message note  Documented alreadyn with request to advise pt, pls read note and let her know

## 2011-09-28 NOTE — Telephone Encounter (Signed)
Pt aware and will await on referral at this time.

## 2011-09-28 NOTE — Telephone Encounter (Signed)
She may be referred to ortho of her choice re hand pain, may also use brace fo carpal tunnel at night see if this helps.let me know if needs referral will address next week

## 2011-09-29 NOTE — Telephone Encounter (Signed)
Noted, no referral now

## 2011-10-03 NOTE — Telephone Encounter (Signed)
Patient aware.

## 2011-10-04 ENCOUNTER — Telehealth: Payer: Self-pay | Admitting: Family Medicine

## 2011-10-04 NOTE — Telephone Encounter (Signed)
Advise her to use antibiotic ointment to affected areas twice daily and keep areas clean and dry, appts will not be available before next week, if worsens go to [facility]

## 2011-10-04 NOTE — Telephone Encounter (Signed)
Patient aware.

## 2011-10-04 NOTE — Telephone Encounter (Signed)
Has been having recurrent boils and now she has one between her eyes and she has been using warm compresses and its almost come to a white head and now she has one that has come up beside her ear but that one looks better. The one between her eyes is red and sore and she wants to know do you want her to come in or does she just need something called in for it?

## 2011-11-02 ENCOUNTER — Other Ambulatory Visit: Payer: Self-pay | Admitting: Family Medicine

## 2011-11-03 ENCOUNTER — Other Ambulatory Visit: Payer: Self-pay | Admitting: Family Medicine

## 2011-11-15 ENCOUNTER — Encounter: Payer: Self-pay | Admitting: Family Medicine

## 2011-11-15 ENCOUNTER — Ambulatory Visit (HOSPITAL_COMMUNITY)
Admission: RE | Admit: 2011-11-15 | Discharge: 2011-11-15 | Disposition: A | Discharge status: Home / Self Care | Payer: BC Managed Care – PPO | Source: Ambulatory Visit | Attending: Family Medicine | Admitting: Family Medicine

## 2011-11-15 ENCOUNTER — Ambulatory Visit (INDEPENDENT_AMBULATORY_CARE_PROVIDER_SITE_OTHER): Payer: BC Managed Care – PPO | Admitting: Family Medicine

## 2011-11-15 VITALS — BP 128/74 | HR 95 | Resp 18 | Ht 61.5 in | Wt 196.1 lb

## 2011-11-15 DIAGNOSIS — I10 Essential (primary) hypertension: Secondary | ICD-10-CM

## 2011-11-15 DIAGNOSIS — E119 Type 2 diabetes mellitus without complications: Secondary | ICD-10-CM

## 2011-11-15 DIAGNOSIS — R05 Cough: Secondary | ICD-10-CM | POA: Insufficient documentation

## 2011-11-15 DIAGNOSIS — J309 Allergic rhinitis, unspecified: Secondary | ICD-10-CM

## 2011-11-15 DIAGNOSIS — J019 Acute sinusitis, unspecified: Secondary | ICD-10-CM

## 2011-11-15 DIAGNOSIS — J209 Acute bronchitis, unspecified: Secondary | ICD-10-CM

## 2011-11-15 DIAGNOSIS — R059 Cough, unspecified: Secondary | ICD-10-CM | POA: Insufficient documentation

## 2011-11-15 MED ORDER — METHYLPREDNISOLONE ACETATE 80 MG/ML IJ SUSP
80.0000 mg | Freq: Once | INTRAMUSCULAR | Status: AC
  Administered 2011-11-15: 80 mg via INTRAMUSCULAR

## 2011-11-15 MED ORDER — SULFAMETHOXAZOLE-TRIMETHOPRIM 800-160 MG PO TABS: 1.0000 | ORAL_TABLET | Freq: Two times a day (BID) | ORAL | Status: AC

## 2011-11-15 MED ORDER — GLIPIZIDE 10 MG PO TABS: 10.0000 mg | ORAL_TABLET | Freq: Two times a day (BID) | ORAL | Status: DC

## 2011-11-15 MED ORDER — BENAZEPRIL-HYDROCHLOROTHIAZIDE 20-12.5 MG PO TABS: 2.0000 | ORAL_TABLET | Freq: Every day | ORAL | Status: DC

## 2011-11-15 MED ORDER — FEXOFENADINE HCL 180 MG PO TABS: 180.0000 mg | ORAL_TABLET | Freq: Every day | ORAL | Status: DC

## 2011-11-15 MED ORDER — NIFEDIPINE ER OSMOTIC RELEASE 90 MG PO TB24: 90.0000 mg | ORAL_TABLET | Freq: Every day | ORAL | Status: DC

## 2011-11-15 MED ORDER — PREDNISONE (PAK) 5 MG PO TABS: 5.0000 mg | ORAL_TABLET | ORAL | Status: DC

## 2011-11-15 MED ORDER — FLUCONAZOLE 150 MG PO TABS: ORAL_TABLET | ORAL | Status: AC

## 2011-11-15 MED ORDER — ALBUTEROL SULFATE HFA 108 (90 BASE) MCG/ACT IN AERS: 2.0000 | INHALATION_SPRAY | Freq: Four times a day (QID) | RESPIRATORY_TRACT | Status: DC | PRN

## 2011-11-15 MED ORDER — PROMETHAZINE-DM 6.25-15 MG/5ML PO SYRP: ORAL_SOLUTION | ORAL | Status: AC

## 2011-11-15 NOTE — Patient Instructions (Addendum)
F/u as before  You are being treated for acute sinusitis , bronchitis, uncontrolled allergies. new scripts are sent in. Use sudafed one daily to reduce sinus drainage please  Depo medrol 80mg  IM in the office and a breathing treatment.  CXR today please. Lab sheet will be provided for labs before next visit

## 2011-11-16 ENCOUNTER — Other Ambulatory Visit: Payer: Self-pay

## 2011-11-16 MED ORDER — GLIPIZIDE 10 MG PO TABS: 10.0000 mg | ORAL_TABLET | Freq: Two times a day (BID) | ORAL | Status: DC

## 2011-11-16 MED ORDER — NIFEDIPINE ER OSMOTIC RELEASE 90 MG PO TB24: 90.0000 mg | ORAL_TABLET | Freq: Every day | ORAL | Status: DC

## 2011-11-16 MED ORDER — BENAZEPRIL-HYDROCHLOROTHIAZIDE 20-12.5 MG PO TABS: 2.0000 | ORAL_TABLET | Freq: Every day | ORAL | Status: DC

## 2011-11-17 DIAGNOSIS — J019 Acute sinusitis, unspecified: Secondary | ICD-10-CM | POA: Insufficient documentation

## 2011-11-17 NOTE — Assessment & Plan Note (Signed)
Antibiotic course prescribed 

## 2011-11-17 NOTE — Assessment & Plan Note (Signed)
Uncontrolled, prednisone dose pack prescribed 

## 2011-11-17 NOTE — Assessment & Plan Note (Signed)
Uncontrolled when last checked. The importance of low carb diet stressed and regular physical activity

## 2011-11-17 NOTE — Assessment & Plan Note (Signed)
antibiotic course prescribed 

## 2011-11-17 NOTE — Progress Notes (Signed)
  Subjective:    Patient ID: Michaela Peterson, female    DOB: [birth year], 56 y.o.   MRN: [id number]  HPI 2 week h/o worsening head and chest congestion, facial pressure with drainage, and low grade fever, cough and chest tightness ,  C/o excessive nasal congestion and difficulty breathing through nostrils and sore thraot with ear pain. Blood sugars still generally average under 130 when checked in the mornings Review of Systems See HPI  Denies chest pains, palpitations and leg swelling Denies abdominal pain, nausea, vomiting,diarrhea or constipation.   Denies dysuria, frequency, hesitancy or incontinence. Denies joint pain, swelling and limitation in mobility. Denies headaches, seizures, numbness, or tingling. Denies depression, anxiety or insomnia. Denies skin break down or rash.        Objective:   Physical Exam Patient alert and oriented and in mild cardiopulmonary distress.  HEENT: No facial asymmetry,  Frontal and maxillary sinus tenderness,  oropharynx pink and moist.  Neck supple no adenopathy.  Chest: decreased air entry, scattered crackles, few wheezes  CVS: S1, S2 no murmurs, no S3.  ABD: Soft non tender. Bowel sounds normal.  Ext: No edema  MS: Adequate ROM spine, shoulders, hips and knees.  Skin: Intact, no ulcerations or rash noted.  Psych: Good eye contact, normal affect. Memory intact not anxious or depressed appearing.  CNS: CN 2-12 intact, power, tone and sensation normal throughout.        Assessment & Plan:

## 2011-11-17 NOTE — Assessment & Plan Note (Signed)
Controlled, no change in medication DASH diet and commitment to daily physical activity for a minimum of 30 minutes discussed and encouraged, as a part of hypertension management. The importance of attaining a healthy weight is also discussed.  

## 2012-01-07 ENCOUNTER — Other Ambulatory Visit: Payer: Self-pay | Admitting: Family Medicine

## 2012-01-08 ENCOUNTER — Other Ambulatory Visit (HOSPITAL_COMMUNITY): Payer: BC Managed Care – PPO

## 2012-01-08 LAB — COMPLETE METABOLIC PANEL WITH GFR
ALT: 34 U/L (ref 0–35)
AST: 24 U/L (ref 0–37)
Albumin: 4.4 g/dL (ref 3.5–5.2)
Alkaline Phosphatase: 81 U/L (ref 39–117)
BUN: 9 mg/dL (ref 6–23)
CO2: 26 mEq/L (ref 19–32)
Calcium: 9.6 mg/dL (ref 8.4–10.5)
Chloride: 104 mEq/L (ref 96–112)
Creat: 0.61 mg/dL (ref 0.50–1.10)
GFR, Est African American: >89 mL/min
GFR, Est Non African American: >89 mL/min
Glucose, Bld: 85 mg/dL (ref 70–99)
Potassium: 4 mEq/L (ref 3.5–5.3)
Sodium: 141 mEq/L (ref 135–145)
Total Bilirubin: 0.4 mg/dL (ref 0.3–1.2)
Total Protein: 7.7 g/dL (ref 6.0–8.3)

## 2012-01-08 LAB — LIPID PANEL
Cholesterol: 175 mg/dL (ref 0–200)
HDL: 64 mg/dL (ref >39)
LDL Cholesterol: 99 mg/dL (ref 0–99)
Total CHOL/HDL Ratio: 2.7 Ratio
Triglycerides: 61 mg/dL (ref <150)
VLDL: 12 mg/dL (ref 0–40)

## 2012-01-08 LAB — HEMOGLOBIN A1C
Hgb A1c MFr Bld: 7.2 % — ABNORMAL HIGH (ref <5.7)
Mean Plasma Glucose: 160 mg/dL — ABNORMAL HIGH (ref <117)

## 2012-01-09 ENCOUNTER — Ambulatory Visit (INDEPENDENT_AMBULATORY_CARE_PROVIDER_SITE_OTHER): Payer: BC Managed Care – PPO | Admitting: Family Medicine

## 2012-01-09 ENCOUNTER — Encounter: Payer: Self-pay | Admitting: Family Medicine

## 2012-01-09 VITALS — BP 126/76 | HR 76 | Resp 18 | Ht 61.5 in | Wt 198.0 lb

## 2012-01-09 DIAGNOSIS — I10 Essential (primary) hypertension: Secondary | ICD-10-CM

## 2012-01-09 DIAGNOSIS — H9209 Otalgia, unspecified ear: Secondary | ICD-10-CM

## 2012-01-09 DIAGNOSIS — H9201 Otalgia, right ear: Secondary | ICD-10-CM | POA: Insufficient documentation

## 2012-01-09 DIAGNOSIS — E785 Hyperlipidemia, unspecified: Secondary | ICD-10-CM

## 2012-01-09 DIAGNOSIS — E119 Type 2 diabetes mellitus without complications: Secondary | ICD-10-CM

## 2012-01-09 DIAGNOSIS — E669 Obesity, unspecified: Secondary | ICD-10-CM

## 2012-01-09 DIAGNOSIS — G35 Multiple sclerosis: Secondary | ICD-10-CM

## 2012-01-09 DIAGNOSIS — Z23 Encounter for immunization: Secondary | ICD-10-CM

## 2012-01-09 DIAGNOSIS — R42 Dizziness and giddiness: Secondary | ICD-10-CM

## 2012-01-09 NOTE — Patient Instructions (Addendum)
Annual exam in 4 month, early March  It is important that you exercise regularly at least 30 minutes 5 times a week. If you develop chest pain, have severe difficulty breathing, or feel very tired, stop exercising immediately and seek medical attention   A healthy diet is rich in fruit, vegetables and whole grains. Poultry fish, nuts and beans are a healthy choice for protein rather then red meat. A low sodium diet and drinking 64 ounces of water daily is generally recommended. Oils and sweet should be limited. Carbohydrates especially for those who are diabetic or overweight, should be limited to 34-45 gram per meal. It is important to eat on a regular schedule, at least 3 times daily. Snacks should be primarily fruits, vegetables or nuts.   BLOOD sugar needs to improve  hBA1C in March, non fast   Excellent blood pressure and cholesterol. No cholesterol med  Needed   Commit to regular meals and janumet as prescribed, start off with half glipizide once or twice daily   Flu vaccine today  Make appt for check on MS with neurologist  Use vaseline only on the lesion on your left forearm, do not squeeze, if persists or enlarges call, you will be referred to dermatology  You are referred to ENT re right ear pain and recurrent vertigo

## 2012-01-09 NOTE — Progress Notes (Signed)
  Subjective:    Patient ID: Michaela Peterson, female    DOB: 03-Sep-1955, 56 y.o.   MRN: [id number]  HPI The PT is here for follow up and re-evaluation of chronic medical conditions, medication management and review of any available recent lab and radiology data.  Preventive health is updated, specifically  Cancer screening and Immunization.   Questions or concerns regarding consultations or procedures which the PT has had in the interim are  addressed. The PT denies any adverse reactions to current medications since the last visit.  C/o persistent right ear discomfort and excessive itching has also had recurrent vertigo. C/o intermittent sharp stinging pain n lower and upper ext , still has neither made nor kept her neurology appt, has a dx of MS with no other neurologic complaints. C/o dark lesion on left forearm,had purulent drainage at one time, not currently painful Non compliant with diabetic meds, and does not eat on a regular schedule     Review of Systems See HPI Denies recent fever or chills. Denies sinus pressure, nasal congestion,  or sore throat. Denies chest congestion, productive cough or wheezing. Denies chest pains, palpitations and leg swelling Denies abdominal pain, nausea, vomiting,diarrhea or constipation.   Denies dysuria, frequency, hesitancy or incontinence. Denies joint pain, swelling and limitation in mobility. Denies headaches, seizures, numbness, or tingling. Denies depression, anxiety or insomnia.         Objective:   Physical Exam Patient alert and oriented and in no cardiopulmonary distress.  HEENT: No facial asymmetry, EOMI, no sinus tenderness,  oropharynx pink and moist.  Neck supple no adenopathy.Right TM dull, not erythematous  Chest: Clear to auscultation bilaterally.  CVS: S1, S2 no murmurs, no S3.  ABD: Soft non tender. Bowel sounds normal.  Ext: No edema  MS: Adequate ROM spine, shoulders, hips and knees.  Skin: Intact,  hyperpigmented lesion dia approx 2mm noted on forearm, non tender, no erythema or darinage  Psych: Good eye contact, normal affect. Memory intact not anxious or depressed appearing.  CNS: CN 2-12 intact, power, tone and sensation normal throughout.  Diabetic Foot Check:  Appearance - no lesions, ulcers or calluses Skin - no unusual pallor or redness Sensation - grossly intact to light touch  Pulses Left - Dorsalis Pedis and Posterior Tibia normal Right - Dorsalis Pedis and Posterior Tibia normal       Assessment & Plan:

## 2012-01-10 MED ORDER — MONTELUKAST SODIUM 10 MG PO TABS: 10.0000 mg | ORAL_TABLET | Freq: Every day | ORAL | Status: DC

## 2012-01-12 ENCOUNTER — Other Ambulatory Visit: Payer: Self-pay | Admitting: Family Medicine

## 2012-01-14 NOTE — Assessment & Plan Note (Signed)
Deteriorated. Patient re-educated about  the importance of commitment to a  minimum of 150 minutes of exercise per week. The importance of healthy food choices with portion control discussed. Encouraged to start a food diary, count calories and to consider  joining a support group. Sample diet sheets offered. Goals set by the patient for the next several months.    

## 2012-01-14 NOTE — Assessment & Plan Note (Signed)
Deteriorated, non compliant with med, diet or exercise, needs to work on all 3

## 2012-01-14 NOTE — Assessment & Plan Note (Signed)
Controlled, no change in medication DASH diet and commitment to daily physical activity for a minimum of 30 minutes discussed and encouraged, as a part of hypertension management. The importance of attaining a healthy weight is also discussed.  

## 2012-01-14 NOTE — Assessment & Plan Note (Signed)
persistent associated with recurrent vertigo, ENT eval

## 2012-01-14 NOTE — Assessment & Plan Note (Signed)
At goal and has been on no medications, no meds indicat for lipids at this time though asa diabetic the case can be made, pt is electing to stay off statins now

## 2012-01-14 NOTE — Assessment & Plan Note (Signed)
Needs to f/u with neurologist, new complaints of spotty abnormal sensation in upper and lower extremities, intermittently in the past 3 to 4 month

## 2012-02-21 ENCOUNTER — Ambulatory Visit (INDEPENDENT_AMBULATORY_CARE_PROVIDER_SITE_OTHER): Payer: BC Managed Care – PPO | Admitting: Otolaryngology

## 2012-02-21 DIAGNOSIS — H9209 Otalgia, unspecified ear: Secondary | ICD-10-CM

## 2012-02-21 DIAGNOSIS — R42 Dizziness and giddiness: Secondary | ICD-10-CM

## 2012-02-21 DIAGNOSIS — H93299 Other abnormal auditory perceptions, unspecified ear: Secondary | ICD-10-CM

## 2012-03-25 ENCOUNTER — Telehealth: Payer: Self-pay | Admitting: Family Medicine

## 2012-03-25 NOTE — Telephone Encounter (Signed)
Tylenol, salt water gargles, for couigh robitussin DM as directed on bottle, pls let her know if worsens in next 7 to 10 days with fever , chills, green sputum etc needs Ov

## 2012-03-26 ENCOUNTER — Encounter: Payer: Self-pay | Admitting: Family Medicine

## 2012-03-26 ENCOUNTER — Ambulatory Visit (INDEPENDENT_AMBULATORY_CARE_PROVIDER_SITE_OTHER): Payer: BC Managed Care – PPO | Admitting: Family Medicine

## 2012-03-26 VITALS — BP 134/82 | HR 71 | Temp 98.5°F | Resp 16

## 2012-03-26 DIAGNOSIS — J302 Other seasonal allergic rhinitis: Secondary | ICD-10-CM | POA: Insufficient documentation

## 2012-03-26 DIAGNOSIS — Z9109 Other allergy status, other than to drugs and biological substances: Secondary | ICD-10-CM

## 2012-03-26 DIAGNOSIS — Z889 Allergy status to unspecified drugs, medicaments and biological substances status: Secondary | ICD-10-CM | POA: Insufficient documentation

## 2012-03-26 DIAGNOSIS — E785 Hyperlipidemia, unspecified: Secondary | ICD-10-CM

## 2012-03-26 DIAGNOSIS — I10 Essential (primary) hypertension: Secondary | ICD-10-CM

## 2012-03-26 DIAGNOSIS — J029 Acute pharyngitis, unspecified: Secondary | ICD-10-CM

## 2012-03-26 DIAGNOSIS — L049 Acute lymphadenitis, unspecified: Secondary | ICD-10-CM

## 2012-03-26 DIAGNOSIS — L040 Acute lymphadenitis of face, head and neck: Secondary | ICD-10-CM | POA: Insufficient documentation

## 2012-03-26 DIAGNOSIS — J309 Allergic rhinitis, unspecified: Secondary | ICD-10-CM

## 2012-03-26 LAB — POCT RAPID STREP A (OFFICE): Rapid Strep A Screen: NEGATIVE

## 2012-03-26 MED ORDER — METHYLPREDNISOLONE ACETATE 80 MG/ML IJ SUSP
80.0000 mg | Freq: Once | INTRAMUSCULAR | Status: AC
  Administered 2012-03-26: 80 mg via INTRAMUSCULAR

## 2012-03-26 MED ORDER — PREDNISONE 5 MG PO TABS: 5.0000 mg | ORAL_TABLET | Freq: Two times a day (BID) | ORAL | Status: AC

## 2012-03-26 MED ORDER — TRIAMTERENE-HCTZ 37.5-25 MG PO TABS: 1.0000 | ORAL_TABLET | Freq: Every day | ORAL | Status: DC

## 2012-03-26 MED ORDER — AZITHROMYCIN 250 MG PO TABS: ORAL_TABLET | ORAL | Status: AC

## 2012-03-26 NOTE — Patient Instructions (Addendum)
F/u as before.  You are treated for uncontrolled allergies, bronchitis and tender node in your  Neck  You will be referred to Dr Willa Rough for allergy testing again. STOP benazepril/hctz, I believe this is a cause of some of your cough. New for your blood pressure is maxzide in place of this. Start taking this tomorrow

## 2012-03-26 NOTE — Progress Notes (Signed)
  Subjective:    Patient ID: Michaela Peterson, female    DOB: 1955/06/19, 57 y.o.   MRN: [id number]  HPI Pt c/o sore throat x 3 days, and tender gland under left jaw. She has had a cough with excessive nasal drainage and pressure x 2 month. She has had intermittent chills but no documented fever Her eyes are swollen, she c/o tickle in her throat  and dry cough constantly      Review of Systems See HPI Denies chest pains, palpitations and leg swelling Denies abdominal pain, nausea, vomiting,diarrhea or constipation.   Denies dysuria, frequency, hesitancy or incontinence. Denies joint pain, swelling and limitation in mobility. Denies headaches, seizures, numbness, or tingling. Denies depression, anxiety or insomnia. Denies skin break down or rash.        Objective:   Physical Exam  Patient alert and oriented and in no cardiopulmonary distress.  HEENT: No facial asymmetry, EOMI, no sinus tenderness,  oropharynx pink and moist.No visible exudate  Neck supple left submental adenitis, bilateral supraorbital edema  Chest: adequate air entry, bilateral crackles,  No wheezes  CVS: S1, S2 no murmurs, no S3.  ABD: Soft non tender. Bowel sounds normal.  Ext: No edema  MS: Adequate ROM spine, shoulders, hips and knees.  Skin: Intact, no ulcerations or rash noted.  Psych: Good eye contact, normal affect. Memory intact not anxious or depressed appearing.  CNS: CN 2-12 intact, power, tone and sensation normal throughout.       Assessment & Plan:

## 2012-03-26 NOTE — Telephone Encounter (Signed)
Patient states that problem has been going on since Thanksgiving and she has tried the advised meds already.  Given work in slot for 1/15

## 2012-03-27 ENCOUNTER — Telehealth: Payer: Self-pay | Admitting: Family Medicine

## 2012-03-27 NOTE — Telephone Encounter (Signed)
Med refaxed

## 2012-03-30 DIAGNOSIS — J029 Acute pharyngitis, unspecified: Secondary | ICD-10-CM | POA: Insufficient documentation

## 2012-03-30 NOTE — Assessment & Plan Note (Signed)
Acute pharyngitis, rapid strep test negative

## 2012-03-30 NOTE — Assessment & Plan Note (Signed)
Uncontrolled, refer for allergy testing

## 2012-03-30 NOTE — Assessment & Plan Note (Signed)
Hyperlipidemia:Low fat diet discussed and encouraged.  Controlled, no change in medication   

## 2012-03-30 NOTE — Assessment & Plan Note (Signed)
Uncontrolled, recurrent/continues allergy complaints, refer for testing again

## 2012-03-30 NOTE — Assessment & Plan Note (Signed)
Controlled, however I believe the ACe is contributing to the cough therefore will change to maxzide DASH diet and commitment to daily physical activity for a minimum of 30 minutes discussed and encouraged, as a part of hypertension management. The importance of attaining a healthy weight is also discussed.

## 2012-03-30 NOTE — Assessment & Plan Note (Signed)
Acute tender left submandibular node, antibiotic prescribed. No dental pathology identified

## 2012-04-04 ENCOUNTER — Other Ambulatory Visit: Payer: Self-pay

## 2012-05-12 ENCOUNTER — Other Ambulatory Visit: Payer: Self-pay | Admitting: Family Medicine

## 2012-06-02 ENCOUNTER — Other Ambulatory Visit: Payer: Self-pay | Admitting: Family Medicine

## 2012-06-03 ENCOUNTER — Other Ambulatory Visit (HOSPITAL_COMMUNITY)
Admission: RE | Admit: 2012-06-03 | Discharge: 2012-06-03 | Disposition: A | Discharge status: Home / Self Care | Payer: BC Managed Care – PPO | Source: Ambulatory Visit | Attending: Family Medicine | Admitting: Family Medicine

## 2012-06-03 ENCOUNTER — Other Ambulatory Visit: Payer: Self-pay | Admitting: Family Medicine

## 2012-06-03 ENCOUNTER — Ambulatory Visit (INDEPENDENT_AMBULATORY_CARE_PROVIDER_SITE_OTHER): Payer: BC Managed Care – PPO | Admitting: Family Medicine

## 2012-06-03 ENCOUNTER — Encounter: Payer: Self-pay | Admitting: Family Medicine

## 2012-06-03 VITALS — BP 136/82 | HR 100 | Resp 18 | Ht 61.5 in | Wt 203.0 lb

## 2012-06-03 DIAGNOSIS — I10 Essential (primary) hypertension: Secondary | ICD-10-CM

## 2012-06-03 DIAGNOSIS — M25569 Pain in unspecified knee: Secondary | ICD-10-CM

## 2012-06-03 DIAGNOSIS — D509 Iron deficiency anemia, unspecified: Secondary | ICD-10-CM

## 2012-06-03 DIAGNOSIS — M25561 Pain in right knee: Secondary | ICD-10-CM | POA: Insufficient documentation

## 2012-06-03 DIAGNOSIS — E785 Hyperlipidemia, unspecified: Secondary | ICD-10-CM

## 2012-06-03 DIAGNOSIS — Z1211 Encounter for screening for malignant neoplasm of colon: Secondary | ICD-10-CM

## 2012-06-03 DIAGNOSIS — Z0000 Encounter for general adult medical examination without abnormal findings: Secondary | ICD-10-CM

## 2012-06-03 DIAGNOSIS — Z1151 Encounter for screening for human papillomavirus (HPV): Secondary | ICD-10-CM | POA: Insufficient documentation

## 2012-06-03 DIAGNOSIS — E119 Type 2 diabetes mellitus without complications: Secondary | ICD-10-CM

## 2012-06-03 DIAGNOSIS — E669 Obesity, unspecified: Secondary | ICD-10-CM

## 2012-06-03 DIAGNOSIS — Z01419 Encounter for gynecological examination (general) (routine) without abnormal findings: Secondary | ICD-10-CM | POA: Insufficient documentation

## 2012-06-03 DIAGNOSIS — J309 Allergic rhinitis, unspecified: Secondary | ICD-10-CM

## 2012-06-03 DIAGNOSIS — Z124 Encounter for screening for malignant neoplasm of cervix: Secondary | ICD-10-CM

## 2012-06-03 LAB — HEMOGLOBIN A1C
Hgb A1c MFr Bld: 7.9 % — ABNORMAL HIGH (ref <5.7)
Mean Plasma Glucose: 180 mg/dL — ABNORMAL HIGH (ref <117)

## 2012-06-03 LAB — POC HEMOCCULT BLD/STL (OFFICE/1-CARD/DIAGNOSTIC): Fecal Occult Blood, POC: NEGATIVE

## 2012-06-03 NOTE — Patient Instructions (Addendum)
F/u in 4 month  Please change your eating habits, your blood sugar is out of control  Fasting lipid, cmp and EGFr, HBA1C, TSH, CBC, and microalb in 4 month  You are referred to Dr Eulah Pont re right knee pain  pls remember to discuss voiding symptoms again at next visit if they continue to be a problem

## 2012-06-09 DIAGNOSIS — Z0000 Encounter for general adult medical examination without abnormal findings: Secondary | ICD-10-CM | POA: Insufficient documentation

## 2012-06-09 NOTE — Assessment & Plan Note (Signed)
Uncontrolled and increased with intermittent instability, ortho to re eval

## 2012-06-09 NOTE — Assessment & Plan Note (Signed)
Controlled, no change in medication DASH diet and commitment to daily physical activity for a minimum of 30 minutes discussed and encouraged, as a part of hypertension management. The importance of attaining a healthy weight is also discussed.  

## 2012-06-09 NOTE — Assessment & Plan Note (Signed)
Uncontrolled and deteriorated. Has been non compliant with med management No med change Patient advised to reduce carb and sweets, commit to regular physical activity, take meds as prescribed, test blood as directed, and attempt to lose weight, to improve blood sugar control.

## 2012-06-09 NOTE — Assessment & Plan Note (Signed)
Pelvic, breast and rectal exam as documented at visit. Pt to see ortho re worsening knee pain and instability. Closer attention to diet to improve blood sugar as well as med compliance stressed

## 2012-06-09 NOTE — Progress Notes (Signed)
  Subjective:    Patient ID: Michaela Peterson, female    DOB: [birth year], 57 y.o.   MRN: [id number]  HPI The PT is here for annual exam  and re-evaluation of chronic medical conditions, medication management and review of any available recent lab and radiology data.  Preventive health is updated, specifically  Cancer screening and Immunization.   Questions or concerns regarding consultations or procedures which the PT has had in the interim are  addressed. The PT denies any adverse reactions to current medications since the last visit.  C/o worsening knee pain and instability, denies falls, has established osteoarthritis Non compliant with diabetic meds    Review of Systems See HPI Denies recent fever or chills. Denies sinus pressure, nasal congestion, ear pain or sore throat. Denies chest congestion, productive cough or wheezing. Denies chest pains, palpitations and leg swelling Denies abdominal pain, nausea, vomiting,diarrhea or constipation.   Denies dysuria, frequency, hesitancy or incontinence.  Denies headaches, seizures, numbness, or tingling. Denies depression, anxiety or insomnia. Denies skin break down or rash.        Objective:   Physical Exam Pleasant well nourished female, alert and oriented x 3, in no cardio-pulmonary distress. Afebrile. HEENT No facial trauma or asymetry. Sinuses non tender.  EOMI, PERTL, fundoscopic exam is normal, no hemorhage or exudate.  External ears normal, tympanic membranes clear. Oropharynx moist, no exudate, fair dentition. Neck: supple, no adenopathy,JVD or thyromegaly.No bruits.  Chest: Clear to ascultation bilaterally.No crackles or wheezes. Non tender to palpation  Breast: No asymetry,no masses. No nipple discharge or inversion.No dimpling of skin or skin lesions No axillary or supraclavicular adenopathy  Cardiovascular system; Heart sounds normal,  S1 and  S2 ,no S3.  No murmur, or thrill. Apical beat not  displaced Peripheral pulses normal.  Abdomen: Soft, non tender, no organomegaly or masses. No bruits. Bowel sounds normal. No guarding, tenderness or rebound.  Rectal:  No mass. Guaiac negative stool.  GU: External genitalia normal. No lesions. Vaginal canal normal.No discharge. Uterus normal size, no adnexal masses, no cervical motion or adnexal tenderness.  Musculoskeletal exam: Full ROM of spine, hips , shoulders and reduced in right  knee. No deformity ,swelling or crepitus noted. No muscle wasting or atrophy.   Neurologic: Cranial nerves 2 to 12 intact. Power, tone ,sensation and reflexes normal throughout. No disturbance in gait. No tremor.  Skin: Intact, no ulceration, erythema , scaling or rash noted. Pigmentation normal throughout  Psych; Normal mood and affect. Judgement and concentration normal        Assessment & Plan:

## 2012-06-09 NOTE — Assessment & Plan Note (Signed)
Controlled, no change in medication  

## 2012-07-04 ENCOUNTER — Telehealth: Payer: Self-pay | Admitting: Internal Medicine

## 2012-07-04 NOTE — Telephone Encounter (Signed)
Pt also wants a prescription sent to [facility] for at least 10 tablets since the mail order will take awhile. She has 3 tablets left and I told her it might be Monday before it was done.

## 2012-07-04 NOTE — Telephone Encounter (Signed)
Patient is asking for a new Rx for Pantoprazole [facility] to [facility] fax number [phone]

## 2012-07-07 ENCOUNTER — Other Ambulatory Visit: Payer: Self-pay | Admitting: Family Medicine

## 2012-07-07 DIAGNOSIS — Z139 Encounter for screening, unspecified: Secondary | ICD-10-CM

## 2012-07-07 NOTE — Telephone Encounter (Signed)
It appears that we have not seen this patient since 01/2010. We cannot provide refills without OV.

## 2012-07-07 NOTE — Telephone Encounter (Signed)
Called and informed pt. She is going to check with her PCP.

## 2012-07-08 ENCOUNTER — Other Ambulatory Visit: Payer: Self-pay | Admitting: Family Medicine

## 2012-07-08 ENCOUNTER — Other Ambulatory Visit: Payer: Self-pay

## 2012-07-08 ENCOUNTER — Telehealth: Payer: Self-pay | Admitting: Family Medicine

## 2012-07-08 MED ORDER — PANTOPRAZOLE SODIUM 40 MG PO TBEC: 40.0000 mg | DELAYED_RELEASE_TABLET | Freq: Every day | ORAL | Status: DC

## 2012-07-08 NOTE — Telephone Encounter (Signed)
Med refilled.

## 2012-07-09 ENCOUNTER — Other Ambulatory Visit: Payer: Self-pay | Admitting: Family Medicine

## 2012-07-25 ENCOUNTER — Ambulatory Visit (INDEPENDENT_AMBULATORY_CARE_PROVIDER_SITE_OTHER): Payer: BC Managed Care – PPO | Admitting: Nurse Practitioner

## 2012-07-25 ENCOUNTER — Encounter: Payer: Self-pay | Admitting: Nurse Practitioner

## 2012-07-25 VITALS — BP 151/85 | HR 95 | Ht 61.5 in | Wt 203.0 lb

## 2012-07-25 DIAGNOSIS — G35 Multiple sclerosis: Secondary | ICD-10-CM

## 2012-07-25 NOTE — Patient Instructions (Addendum)
Neuro exam normal Followup yearly

## 2012-07-25 NOTE — Progress Notes (Signed)
HPI: Patient returns for followup after last visit with  Dr. Marjory Lies 06/02/2010. She has a history of multiple sclerosis without significant clinical progression. Last MRI every second 2011 did not show progression from previous, she is not interested in resuming any MS treatment even after discussing the importance of maintaining her current functional status. She is a prior patient of Dr. Thad Ranger who  left our practice. She was diagnosed in 2004 on the basis of demyelinating plaques in the cervical spine and typical CSF findings. She was initially treated with Rebif but discontinued the drug due to her injection site reactions in April of 2005. She has not been on disease modifying treatment since that time, she reports some numbness and tingling in the feet which she feels has been stable for many years. She denies any double vision, dysarthria, dysphagia, bowel or bladder difficulties.   ROS:  fatigue, swelling in legs, snoring, blurred vision, feeling hot, joint pain, joint swelling, allergies, runny nose, skin sensitivity, headace, restless leg, dizziness,    Physical Exam General: well developed, obese female , seated, in no evident distress Head: head normocephalic and atraumatic. Oropharynx benign Neck: supple with no carotid or supraclavicular bruits Cardiovascular: regular rate and rhythm, no murmurs  Neurologic Exam Mental Status: Awake and fully alert. Oriented to place and time. Follows all commands speech and language are normal .  Cranial Nerves: Fundoscopic exam reveals sharp disc margins. Pupils equal, briskly reactive to light. Extraocular movements full without nystagmus. Visual fields full to confrontation.Exophthalmos.  Hearing intact and symmetric to finger snap. Facial sensation intact. Face, tongue, palate move normally and symmetrically. Neck flexion and extension normal.  Motor: Normal bulk and tone. Normal strength in all tested extremity muscles. Sensory.: intact to  touch and pinprick and vibratory.  Coordination: Rapid alternating movements normal in all extremities. Finger-to-nose and heel-to-shin performed accurately bilaterally. Gait and Station: Arises from chair without difficulty. Stance is normal. Gait demonstrates normal stride length and balance . Able to heel, toe and tandem walk without difficulty.  Reflexes: 2+ and symmetric except absent at ankles. Toes downgoing.     ASSESSMENT: History of multiple sclerosis diagnosed in 2004, has not been on disease modifying medications since April 2005 MS is clinically stable with no progression of the disease on MRI 04/13/2009.     PLAN: Discussed starting disease modifying therapies but patient is not interested in medication at this time. She is aware that there are oral preparations on the market. She wants to continue to observe even though we discussed  treatment to avoid long-term progression to disability.  Nilda Riggs, GNP-BC APRN

## 2012-07-28 NOTE — Progress Notes (Signed)
I reviewed note and agree with plan.   Murlin Schrieber R. Elizabeht Suto, MD  Certified in Neurology, Neurophysiology and Neuroimaging  [facility] [address], [address] [place], [place] [address] [phone]   

## 2012-08-18 ENCOUNTER — Ambulatory Visit (HOSPITAL_COMMUNITY)
Admission: RE | Admit: 2012-08-18 | Discharge: 2012-08-18 | Disposition: A | Discharge status: Home / Self Care | Payer: BC Managed Care – PPO | Source: Ambulatory Visit | Attending: Family Medicine | Admitting: Family Medicine

## 2012-08-18 DIAGNOSIS — Z139 Encounter for screening, unspecified: Secondary | ICD-10-CM

## 2012-08-18 DIAGNOSIS — Z1231 Encounter for screening mammogram for malignant neoplasm of breast: Secondary | ICD-10-CM | POA: Insufficient documentation

## 2012-09-30 ENCOUNTER — Other Ambulatory Visit: Payer: Self-pay

## 2012-09-30 MED ORDER — GLUCOSE BLOOD VI STRP: ORAL_STRIP | Status: DC

## 2012-10-06 ENCOUNTER — Other Ambulatory Visit: Payer: Self-pay | Admitting: Family Medicine

## 2012-10-07 LAB — COMPLETE METABOLIC PANEL WITH GFR
ALT: 81 U/L — ABNORMAL HIGH (ref 0–35)
AST: 68 U/L — ABNORMAL HIGH (ref 0–37)
Albumin: 4.6 g/dL (ref 3.5–5.2)
Alkaline Phosphatase: 64 U/L (ref 39–117)
BUN: 13 mg/dL (ref 6–23)
CO2: 25 mEq/L (ref 19–32)
Calcium: 9.7 mg/dL (ref 8.4–10.5)
Chloride: 103 mEq/L (ref 96–112)
Creat: 0.7 mg/dL (ref 0.50–1.10)
GFR, Est African American: >89 mL/min
GFR, Est Non African American: >89 mL/min
Glucose, Bld: 113 mg/dL — ABNORMAL HIGH (ref 70–99)
Potassium: 4.1 mEq/L (ref 3.5–5.3)
Sodium: 139 mEq/L (ref 135–145)
Total Bilirubin: 0.4 mg/dL (ref 0.3–1.2)
Total Protein: 7.9 g/dL (ref 6.0–8.3)

## 2012-10-07 LAB — CBC WITH DIFFERENTIAL/PLATELET
Basophils Absolute: 0.1 10*3/uL (ref 0.0–0.1)
Basophils Relative: 1 % (ref 0–1)
Eosinophils Absolute: 0.1 10*3/uL (ref 0.0–0.7)
Eosinophils Relative: 1 % (ref 0–5)
HCT: 34.9 % — ABNORMAL LOW (ref 36.0–46.0)
Hemoglobin: 11.5 g/dL — ABNORMAL LOW (ref 12.0–15.0)
Lymphocytes Relative: 34 % (ref 12–46)
Lymphs Abs: 2.8 10*3/uL (ref 0.7–4.0)
MCH: 25.6 pg — ABNORMAL LOW (ref 26.0–34.0)
MCHC: 33 g/dL (ref 30.0–36.0)
MCV: 77.7 fL — ABNORMAL LOW (ref 78.0–100.0)
Monocytes Absolute: 0.4 10*3/uL (ref 0.1–1.0)
Monocytes Relative: 5 % (ref 3–12)
Neutro Abs: 4.8 10*3/uL (ref 1.7–7.7)
Neutrophils Relative %: 59 % (ref 43–77)
Platelets: 484 10*3/uL — ABNORMAL HIGH (ref 150–400)
RBC: 4.49 MIL/uL (ref 3.87–5.11)
RDW: 16.2 % — ABNORMAL HIGH (ref 11.5–15.5)
WBC: 8.1 10*3/uL (ref 4.0–10.5)

## 2012-10-07 LAB — HEMOGLOBIN A1C
Hgb A1c MFr Bld: 8.1 % — ABNORMAL HIGH (ref <5.7)
Mean Plasma Glucose: 186 mg/dL — ABNORMAL HIGH (ref <117)

## 2012-10-07 LAB — MICROALBUMIN / CREATININE URINE RATIO
Creatinine, Urine: 206.3 mg/dL
Microalb Creat Ratio: 5.2 mg/g (ref 0.0–30.0)
Microalb, Ur: 1.08 mg/dL (ref 0.00–1.89)

## 2012-10-07 LAB — LIPID PANEL
Cholesterol: 162 mg/dL (ref 0–200)
HDL: 57 mg/dL (ref >39)
LDL Cholesterol: 92 mg/dL (ref 0–99)
Total CHOL/HDL Ratio: 2.8 Ratio
Triglycerides: 67 mg/dL (ref <150)
VLDL: 13 mg/dL (ref 0–40)

## 2012-10-07 LAB — TSH: TSH: 2.147 u[IU]/mL (ref 0.350–4.500)

## 2012-10-08 ENCOUNTER — Encounter: Payer: Self-pay | Admitting: Family Medicine

## 2012-10-08 ENCOUNTER — Ambulatory Visit (INDEPENDENT_AMBULATORY_CARE_PROVIDER_SITE_OTHER): Payer: BC Managed Care – PPO | Admitting: Family Medicine

## 2012-10-08 ENCOUNTER — Telehealth: Payer: Self-pay | Admitting: Family Medicine

## 2012-10-08 VITALS — BP 130/80 | HR 88 | Resp 18 | Ht 61.5 in | Wt 202.0 lb

## 2012-10-08 DIAGNOSIS — I10 Essential (primary) hypertension: Secondary | ICD-10-CM

## 2012-10-08 DIAGNOSIS — K219 Gastro-esophageal reflux disease without esophagitis: Secondary | ICD-10-CM

## 2012-10-08 DIAGNOSIS — E669 Obesity, unspecified: Secondary | ICD-10-CM

## 2012-10-08 DIAGNOSIS — E119 Type 2 diabetes mellitus without complications: Secondary | ICD-10-CM

## 2012-10-08 DIAGNOSIS — J309 Allergic rhinitis, unspecified: Secondary | ICD-10-CM

## 2012-10-08 MED ORDER — DAPAGLIFLOZIN PROPANEDIOL 5 MG PO TABS: 1.0000 | ORAL_TABLET | Freq: Every day | ORAL | Status: DC

## 2012-10-08 NOTE — Patient Instructions (Addendum)
F/u in 3 month, call 2nd week in September if blood sugar is still high for dose adjustment  PLEASE keep apppt with dietitian, impt  Daily exercise 15 to 20 mins  nEW is farxiga 5mg  one daily, continue other medication for diabetes  You will get info from the office re healthy diabetic eating  Test once every day and record  Goal for fasting blood sugar ranges from 80 to 120 and 2 hours after any meal or at bedtime should be between 130 to 170.  HBA1C and chem 7 noon fast in 3 month, before visit  Hepatitis panel result will be sent to you, sign in office into my chart today  WEIGHT LOSS is essentiol for your health  Headache is due to sinus allergies, flush EVERY DAY  No sign of infection or foreign body in your toe  Blood sugar  diary will be given,please use this

## 2012-10-08 NOTE — Telephone Encounter (Signed)
Noted  

## 2012-10-08 NOTE — Progress Notes (Signed)
  Subjective:    Patient ID: Claudell Kyle, female    DOB: 13-Apr-1955, 57 y.o.   MRN: [id number]  HPI The PT is here for follow up and re-evaluation of chronic medical conditions, medication management and review of any available recent lab and radiology data.  Preventive health is updated, specifically  Cancer screening and Immunization.   The PT denies any adverse reactions to current medications since the last visit.  There are no new concerns.  There are no specific complaints  Has not made the necessary commitment to improved blood sugar control, has not been testing regularly either and this has significantly deteriorated. Denies polyuria , polydipsia or hypoglycemic episodes      Review of Systems See HPI Denies recent fever or chills. Chronic  sinus pressure, nasal congestion, improved more faithful use of medication, denies ear pain or sore throat. Denies chest congestion, productive cough or wheezing. Denies chest pains, palpitations and leg swelling Denies abdominal pain, nausea, vomiting,diarrhea or constipation.   Denies dysuria, frequency, hesitancy or incontinence. Denies joint pain, swelling and limitation in mobility. Denies headaches, seizures, numbness, or tingling. Denies depression, anxiety or insomnia. Denies skin break down or rash.        Objective:   Physical Exam  Patient alert and oriented and in no cardiopulmonary distress.  HEENT: No facial asymmetry, EOMI, no sinus tenderness,  oropharynx pink and moist.  Neck supple no adenopathy.  Chest: Clear to auscultation bilaterally.  CVS: S1, S2 no murmurs, no S3.  ABD: Soft non tender. Bowel sounds normal.  Ext: No edema  MS: Adequate ROM spine, shoulders, hips and knees.  Skin: Intact, no ulcerations or rash noted.  Psych: Good eye contact, normal affect. Memory intact not anxious or depressed appearing.  CNS: CN 2-12 intact, power, tone and sensation normal throughout.       Assessment  & Plan:

## 2012-10-09 ENCOUNTER — Other Ambulatory Visit: Payer: Self-pay

## 2012-10-09 LAB — HEPATITIS PANEL, ACUTE
HCV Ab: NEGATIVE
Hep A IgM: NEGATIVE
Hep B C IgM: NEGATIVE
Hepatitis B Surface Ag: NEGATIVE

## 2012-10-09 MED ORDER — SITAGLIPTIN PHOS-METFORMIN HCL 50-1000 MG PO TABS: ORAL_TABLET | ORAL | Status: DC

## 2012-10-09 NOTE — Telephone Encounter (Signed)
Noted  

## 2012-10-10 MED ORDER — LORATADINE 10 MG PO TABS: ORAL_TABLET | ORAL | Status: DC

## 2012-10-11 NOTE — Assessment & Plan Note (Signed)
Controlled, no change in medication  

## 2012-10-11 NOTE — Assessment & Plan Note (Signed)
Controlled, no change in medication DASH diet and commitment to daily physical activity for a minimum of 30 minutes discussed and encouraged, as a part of hypertension management. The importance of attaining a healthy weight is also discussed.  

## 2012-10-11 NOTE — Assessment & Plan Note (Signed)
Deteriorated. Patient re-educated about  the importance of commitment to a  minimum of 150 minutes of exercise per week. The importance of healthy food choices with portion control discussed. Encouraged to start a food diary, count calories and to consider  joining a support group. Sample diet sheets offered. Goals set by the patient for the next several months.    

## 2012-10-11 NOTE — Assessment & Plan Note (Signed)
Deteriorated, needs diabetic ed, also needs to follow plan for improved control discussed Patient advised to reduce carb and sweets, commit to regular physical activity, take meds as prescribed, test blood as directed, and attempt to lose weight, to improve blood sugar control. Additional med , farxiga, call back if uncontrolled still

## 2012-10-11 NOTE — Assessment & Plan Note (Signed)
Unchanged, pt encouraged and reminded to continue daily medication for improved symptom control

## 2012-11-11 ENCOUNTER — Other Ambulatory Visit: Payer: Self-pay

## 2012-11-11 MED ORDER — NIFEDIPINE ER OSMOTIC RELEASE 90 MG PO TB24: 90.0000 mg | ORAL_TABLET | Freq: Every day | ORAL | Status: DC

## 2012-11-11 MED ORDER — GLIPIZIDE 10 MG PO TABS: 10.0000 mg | ORAL_TABLET | Freq: Two times a day (BID) | ORAL | Status: DC

## 2012-11-21 ENCOUNTER — Other Ambulatory Visit: Payer: Self-pay | Admitting: Family Medicine

## 2012-11-21 MED ORDER — DAPAGLIFLOZIN PROPANEDIOL 10 MG PO TABS: 1.0000 | ORAL_TABLET | Freq: Every day | ORAL | Status: DC

## 2012-11-21 NOTE — Telephone Encounter (Signed)
Scri[pt was printed and I generated a tele msg

## 2012-11-21 NOTE — Telephone Encounter (Signed)
Noted  

## 2012-11-21 NOTE — Telephone Encounter (Signed)
pls inc farxiga to 10mg  one daily let pt know send in historically entered script pls

## 2013-01-08 ENCOUNTER — Encounter: Payer: Self-pay | Admitting: Family Medicine

## 2013-01-08 ENCOUNTER — Ambulatory Visit (INDEPENDENT_AMBULATORY_CARE_PROVIDER_SITE_OTHER): Payer: BC Managed Care – PPO | Admitting: Family Medicine

## 2013-01-08 VITALS — BP 160/84 | HR 66 | Resp 16 | Ht 61.5 in | Wt 200.1 lb

## 2013-01-08 DIAGNOSIS — E1149 Type 2 diabetes mellitus with other diabetic neurological complication: Secondary | ICD-10-CM

## 2013-01-08 DIAGNOSIS — E669 Obesity, unspecified: Secondary | ICD-10-CM

## 2013-01-08 DIAGNOSIS — G35 Multiple sclerosis: Secondary | ICD-10-CM

## 2013-01-08 DIAGNOSIS — J309 Allergic rhinitis, unspecified: Secondary | ICD-10-CM

## 2013-01-08 DIAGNOSIS — F919 Conduct disorder, unspecified: Secondary | ICD-10-CM

## 2013-01-08 DIAGNOSIS — R4689 Other symptoms and signs involving appearance and behavior: Secondary | ICD-10-CM

## 2013-01-08 DIAGNOSIS — E119 Type 2 diabetes mellitus without complications: Secondary | ICD-10-CM

## 2013-01-08 DIAGNOSIS — Z79899 Other long term (current) drug therapy: Secondary | ICD-10-CM

## 2013-01-08 DIAGNOSIS — Z23 Encounter for immunization: Secondary | ICD-10-CM

## 2013-01-08 DIAGNOSIS — I10 Essential (primary) hypertension: Secondary | ICD-10-CM

## 2013-01-08 LAB — BASIC METABOLIC PANEL
BUN: 14 mg/dL (ref 6–23)
CO2: 26 mEq/L (ref 19–32)
Calcium: 9.8 mg/dL (ref 8.4–10.5)
Chloride: 101 mEq/L (ref 96–112)
Creat: 0.77 mg/dL (ref 0.50–1.10)
Glucose, Bld: 111 mg/dL — ABNORMAL HIGH (ref 70–99)
Potassium: 4.3 mEq/L (ref 3.5–5.3)
Sodium: 139 mEq/L (ref 135–145)

## 2013-01-08 LAB — HEMOGLOBIN A1C
Hgb A1c MFr Bld: 8.1 % — ABNORMAL HIGH (ref <5.7)
Mean Plasma Glucose: 186 mg/dL — ABNORMAL HIGH (ref <117)

## 2013-01-08 MED ORDER — DAPAGLIFLOZIN PROPANEDIOL 10 MG PO TABS: 1.0000 | ORAL_TABLET | Freq: Every day | ORAL | Status: DC

## 2013-01-08 MED ORDER — SITAGLIP PHOS-METFORMIN HCL ER 50-1000 MG PO TB24: 2.0000 | ORAL_TABLET | Freq: Every day | ORAL | Status: DC

## 2013-01-08 NOTE — Progress Notes (Signed)
  Subjective:    Patient ID: Michaela Peterson, female    DOB: [birth year], 57 y.o.   MRN: [id number]  HPI The PT is here for follow up and re-evaluation of chronic medical conditions, medication management and review of any available recent lab and radiology data.  Preventive health is updated, specifically  Cancer screening and Immunization.    The PT denies any adverse reactions to current medications since the last visit. Still takes chronic medication inconsistently, often "forgetting" and no regular testing of blood sugar There are no new concerns. Continues to report the responsibility of adult children and grandchildren is taking up all of her energy, denies depression There are no specific complaints       Review of Systems See HPI Denies recent fever or chills. Denies sinus pressure, nasal congestion, ear pain or sore throat. Denies chest congestion, productive cough or wheezing. Denies chest pains, palpitations and leg swelling Denies abdominal pain, nausea, vomiting,diarrhea or constipation.   Denies dysuria, frequency, hesitancy or incontinence. Denies joint pain, swelling and limitation in mobility. Denies headaches, seizures, numbness, or tingling. Denies depression, anxiety or insomnia. Denies skin break down or rash.        Objective:   Physical Exam Patient alert and oriented and in no cardiopulmonary distress.  HEENT: No facial asymmetry, EOMI, no sinus tenderness,  oropharynx pink and moist.  Neck supple no adenopathy.  Chest: Clear to auscultation bilaterally.  CVS: S1, S2 no murmurs, no S3.  ABD: Soft non tender. Bowel sounds normal.  Ext: No edema  MS: Adequate ROM spine, shoulders, hips and knees.  Skin: Intact, no ulcerations or rash noted.  Psych: Good eye contact, normal affect. Memory intact not anxious or depressed appearing.  CNS: CN 2-12 intact, power, tone and sensation normal throughout.        Assessment & Plan:

## 2013-01-08 NOTE — Patient Instructions (Addendum)
F/u in 3 month, call if you need me before  NEW for blood sugar take all 3 with breakfast  Glipizide 10mg  tablet take HALF every morning Janumet TWO every morning Farxiga 10mg  ONE every morning (coupon today)  Goal for fasting blood sugar ranges from 80 to 120 and 2 hours after any meal or at bedtime should be between 130 to 170.   Flu vaccine  Today    Fasting lipid, cmp and EGFr and HBA1c in 3 month  Take the 2 blood pressure pills at breakfast along with all 3 diabetes tablets  Keep away from sugar

## 2013-01-11 DIAGNOSIS — R4689 Other symptoms and signs involving appearance and behavior: Secondary | ICD-10-CM | POA: Insufficient documentation

## 2013-01-11 NOTE — Assessment & Plan Note (Signed)
Stable off medication, has had neurologic eval this year

## 2013-01-11 NOTE — Assessment & Plan Note (Signed)
Uncontrolled, non compliant with meds, states has a lot on her mind with family , often "forgets " to take meds. Re educated re need to change this habit to improve her health, she remains at high risk for all the complications of uncontrolled HTN due to ongoing non compliance DASH diet and commitment to daily physical activity for a minimum of 30 minutes discussed and encouraged, as a part of hypertension management. The importance of attaining a healthy weight is also discussed.

## 2013-01-11 NOTE — Assessment & Plan Note (Signed)
Ongoing poor control due to on compliance. Pt not even testing blood sugars regularly or taking meds as prescribed. Re educated and encouraged to change, to return in 6 to 7 weeks with log and meter Patient advised to reduce carb and sweets, commit to regular physical activity, take meds as prescribed, test blood as directed, and attempt to lose weight, to improve blood sugar control.

## 2013-01-11 NOTE — Assessment & Plan Note (Signed)
Ongoing non compliance with medical mangement , increasing risk of CVD, increased morbidity and increased risk of mortality

## 2013-01-11 NOTE — Assessment & Plan Note (Addendum)
unchnagedeteriorated. Patient re-educated about  the importance of commitment to a  minimum of 150 minutes of exercise per week. The importance of healthy food choices with portion control discussed. Encouraged to start a food diary, count calories and to consider  joining a support group. Sample diet sheets offered. Goals set by the patient for the next several months.

## 2013-01-11 NOTE — Assessment & Plan Note (Signed)
Improved, no recent flares

## 2013-01-20 ENCOUNTER — Telehealth: Payer: Self-pay | Admitting: Family Medicine

## 2013-01-20 NOTE — Telephone Encounter (Signed)
erx fluconazole 150mg  #2 tabs, one tab once daily as needed, for vag itch refill 1 pls [place] let her know that as she reduce sweet and carbs , which is the goal, she should nott be bothered by yeast infection

## 2013-01-20 NOTE — Telephone Encounter (Signed)
Please advise 

## 2013-01-21 MED ORDER — FLUCONAZOLE 150 MG PO TABS: 150.0000 mg | ORAL_TABLET | Freq: Every day | ORAL | Status: DC | PRN

## 2013-01-21 NOTE — Telephone Encounter (Signed)
Patient aware and med sent to pharmacy.  

## 2013-03-24 ENCOUNTER — Other Ambulatory Visit: Payer: Self-pay | Admitting: Family Medicine

## 2013-04-09 ENCOUNTER — Encounter: Payer: Self-pay | Admitting: Family Medicine

## 2013-04-09 ENCOUNTER — Ambulatory Visit (INDEPENDENT_AMBULATORY_CARE_PROVIDER_SITE_OTHER): Payer: BC Managed Care – PPO | Admitting: Family Medicine

## 2013-04-09 VITALS — BP 140/82 | HR 70 | Resp 18 | Ht 61.5 in | Wt 193.0 lb

## 2013-04-09 DIAGNOSIS — E669 Obesity, unspecified: Secondary | ICD-10-CM

## 2013-04-09 DIAGNOSIS — G909 Disorder of the autonomic nervous system, unspecified: Secondary | ICD-10-CM

## 2013-04-09 DIAGNOSIS — N926 Irregular menstruation, unspecified: Secondary | ICD-10-CM

## 2013-04-09 DIAGNOSIS — J309 Allergic rhinitis, unspecified: Secondary | ICD-10-CM

## 2013-04-09 DIAGNOSIS — E1169 Type 2 diabetes mellitus with other specified complication: Secondary | ICD-10-CM

## 2013-04-09 DIAGNOSIS — M25561 Pain in right knee: Secondary | ICD-10-CM

## 2013-04-09 DIAGNOSIS — M25569 Pain in unspecified knee: Secondary | ICD-10-CM

## 2013-04-09 DIAGNOSIS — I10 Essential (primary) hypertension: Secondary | ICD-10-CM

## 2013-04-09 DIAGNOSIS — E1149 Type 2 diabetes mellitus with other diabetic neurological complication: Secondary | ICD-10-CM

## 2013-04-09 DIAGNOSIS — E1143 Type 2 diabetes mellitus with diabetic autonomic (poly)neuropathy: Secondary | ICD-10-CM

## 2013-04-09 DIAGNOSIS — IMO0002 Reserved for concepts with insufficient information to code with codable children: Secondary | ICD-10-CM

## 2013-04-09 DIAGNOSIS — E1159 Type 2 diabetes mellitus with other circulatory complications: Secondary | ICD-10-CM

## 2013-04-09 DIAGNOSIS — R9389 Abnormal findings on diagnostic imaging of other specified body structures: Secondary | ICD-10-CM

## 2013-04-09 DIAGNOSIS — K219 Gastro-esophageal reflux disease without esophagitis: Secondary | ICD-10-CM

## 2013-04-09 DIAGNOSIS — E1165 Type 2 diabetes mellitus with hyperglycemia: Secondary | ICD-10-CM

## 2013-04-09 DIAGNOSIS — E119 Type 2 diabetes mellitus without complications: Secondary | ICD-10-CM

## 2013-04-09 DIAGNOSIS — M7989 Other specified soft tissue disorders: Secondary | ICD-10-CM | POA: Insufficient documentation

## 2013-04-09 LAB — COMPLETE METABOLIC PANEL WITH GFR
ALT: 44 U/L — ABNORMAL HIGH (ref 0–35)
AST: 30 U/L (ref 0–37)
Albumin: 4.5 g/dL (ref 3.5–5.2)
Alkaline Phosphatase: 87 U/L (ref 39–117)
BUN: 16 mg/dL (ref 6–23)
CO2: 27 mEq/L (ref 19–32)
Calcium: 9.9 mg/dL (ref 8.4–10.5)
Chloride: 104 mEq/L (ref 96–112)
Creat: 0.77 mg/dL (ref 0.50–1.10)
GFR, Est African American: >89 mL/min
GFR, Est Non African American: 86 mL/min
Glucose, Bld: 108 mg/dL — ABNORMAL HIGH (ref 70–99)
Potassium: 4.4 mEq/L (ref 3.5–5.3)
Sodium: 139 mEq/L (ref 135–145)
Total Bilirubin: 0.3 mg/dL (ref 0.2–1.2)
Total Protein: 7.8 g/dL (ref 6.0–8.3)

## 2013-04-09 LAB — LIPID PANEL
Cholesterol: 166 mg/dL (ref 0–200)
HDL: 61 mg/dL (ref >39)
LDL Cholesterol: 93 mg/dL (ref 0–99)
Total CHOL/HDL Ratio: 2.7 Ratio
Triglycerides: 60 mg/dL (ref <150)
VLDL: 12 mg/dL (ref 0–40)

## 2013-04-09 LAB — HEMOGLOBIN A1C
Hgb A1c MFr Bld: 7.5 % — ABNORMAL HIGH (ref <5.7)
Mean Plasma Glucose: 169 mg/dL — ABNORMAL HIGH (ref <117)

## 2013-04-09 MED ORDER — DICLOFENAC SODIUM 1 % TD GEL: 2.0000 g | Freq: Four times a day (QID) | TRANSDERMAL | Status: DC

## 2013-04-09 MED ORDER — IBUPROFEN 800 MG PO TABS: 800.0000 mg | ORAL_TABLET | Freq: Three times a day (TID) | ORAL | Status: DC | PRN

## 2013-04-09 NOTE — Patient Instructions (Addendum)
F./U  In 4 month, call if you need me before  Congrats on improved blood sugar and weight loss , keep it up  You are referred to Dr [name] re irreg bleeding  Please try to get eye exam  Ibuprofen twice daily for 5 days for knee and shoulder pain, then as needed  Try voltaren gel (new) when NOT using ibuprofen, may apply to affected areas up to 4 times daily  Non fasting chem 7 and EGFR and HBA1C in 4 month  Spoke with pt , she still has not been told of doppler for rigth leg, will address with referral staff, she needs this in the morning, she is aware, it is actually past due

## 2013-04-12 MED ORDER — NIFEDIPINE ER 90 MG PO TB24: 90.0000 mg | ORAL_TABLET | Freq: Every day | ORAL | Status: DC

## 2013-04-12 NOTE — Assessment & Plan Note (Signed)
Controlled, no change in medication  

## 2013-04-12 NOTE — Assessment & Plan Note (Signed)
Thickened endometrium with irregular bleeding, needs gyne f/u , referral entered

## 2013-04-12 NOTE — Progress Notes (Signed)
   Subjective:    Patient ID: Michaela Peterson, female    DOB: 12-May-1955, 58 y.o.   MRN: [id number]  HPI The PT is here for follow up and re-evaluation of chronic medical conditions, medication management and review of any available recent lab and radiology data.  Preventive health is updated, specifically  Cancer screening and Immunization.   Has been having a lot of dental work, still needs more done, and is trying to get this done before she follows up on eye exam C/o right calf pain and swelling ever since wearing a pair of boots approx 4 weeks ago, Thinks she feels "a knot" denies cough, shortness of breath or hemoptysis , no previous h/o DVt The PT denies any adverse reactions to current medications since the last visit.  There are no new concerns. Has been more diligent with diet and exercise, doing well on farxiga, blood sugars have improved, denies polyuria, polydipsia or blurred vision     Review of Systems See HPI Denies recent fever or chills. Denies sinus pressure, nasal congestion, ear pain or sore throat.Has had difficulty eating solid food due to ongoing dental work Denies chest congestion, productive cough or wheezing. Denies chest pains, palpitations PND or orthopnea, c/o swelling and tenderness in right calf Denies abdominal pain, nausea, vomiting,diarrhea or constipation.   Denies dysuria, frequency, hesitancy or incontinence. Denies joint pain, swelling and limitation in mobility. Denies headaches, seizures, numbness, or tingling. Denies depression, anxiety or insomnia. Denies skin break down or rash.        Objective:   Physical Exam Patient alert and oriented and in no cardiopulmonary distress.  HEENT: No facial asymmetry, EOMI, no sinus tenderness,  oropharynx pink and moist.  Neck supple no adenopathy.  Chest: Clear to auscultation bilaterally.  CVS: S1, S2 no murmurs, no S3.  ABD: Soft non tender. Bowel sounds normal.  Ext: No edema. Right calf  larger than left with mild tenderness to palpation. No "nodule" palpated, Homan's negative  MS: Adequate ROM spine, shoulders, hips and knees.  Skin: Intact, no ulcerations or rash noted.  Psych: Good eye contact, normal affect. Memory intact not anxious or depressed appearing.  CNS: CN 2-12 intact, power, tone and sensation normal throughout.        Assessment & Plan:

## 2013-04-12 NOTE — Assessment & Plan Note (Signed)
Controlled, no change in medication DASH diet and commitment to daily physical activity for a minimum of 30 minutes discussed and encouraged, as a part of hypertension management. The importance of attaining a healthy weight is also discussed.  

## 2013-04-12 NOTE — Assessment & Plan Note (Signed)
Controlled on daily claritin, continue same

## 2013-04-12 NOTE — Addendum Note (Signed)
Addended by: [name] on: 04/12/2013 03:47 PM   Modules accepted: Orders

## 2013-04-12 NOTE — Assessment & Plan Note (Signed)
Physica;l exam inconclusive, needs venous doppler to r/o DVt , exam is ordered as a stat,

## 2013-04-12 NOTE — Assessment & Plan Note (Signed)
Improved, needs to improve further. Dietary improvement with reduced carb intake stressed

## 2013-04-12 NOTE — Assessment & Plan Note (Signed)
Improved. Pt applauded on succesful weight loss through lifestyle change, and encouraged to continue same. Weight loss goal set for the next several months.  

## 2013-04-13 ENCOUNTER — Ambulatory Visit (HOSPITAL_COMMUNITY)
Admission: RE | Admit: 2013-04-13 | Discharge: 2013-04-13 | Disposition: A | Discharge status: Home / Self Care | Payer: BC Managed Care – PPO | Source: Ambulatory Visit | Attending: Family Medicine | Admitting: Family Medicine

## 2013-04-13 ENCOUNTER — Encounter: Payer: Self-pay | Admitting: Family Medicine

## 2013-04-13 DIAGNOSIS — M7989 Other specified soft tissue disorders: Secondary | ICD-10-CM | POA: Insufficient documentation

## 2013-04-13 DIAGNOSIS — M79609 Pain in unspecified limb: Secondary | ICD-10-CM | POA: Insufficient documentation

## 2013-04-14 ENCOUNTER — Telehealth: Payer: Self-pay | Admitting: Family Medicine

## 2013-04-14 ENCOUNTER — Other Ambulatory Visit: Payer: Self-pay

## 2013-04-14 DIAGNOSIS — M25561 Pain in right knee: Secondary | ICD-10-CM

## 2013-04-14 MED ORDER — DICLOFENAC SODIUM 1 % TD GEL: 2.0000 g | Freq: Four times a day (QID) | TRANSDERMAL | Status: DC

## 2013-04-15 NOTE — Telephone Encounter (Signed)
Medication clarification given.  Med cancelled and sent too local pharmacy.

## 2013-04-22 ENCOUNTER — Encounter: Payer: Self-pay | Admitting: Obstetrics and Gynecology

## 2013-04-22 ENCOUNTER — Ambulatory Visit (INDEPENDENT_AMBULATORY_CARE_PROVIDER_SITE_OTHER): Payer: BC Managed Care – PPO | Admitting: Obstetrics and Gynecology

## 2013-04-22 VITALS — BP 140/76 | Ht 61.5 in | Wt 194.0 lb

## 2013-04-22 DIAGNOSIS — D259 Leiomyoma of uterus, unspecified: Secondary | ICD-10-CM

## 2013-04-22 DIAGNOSIS — N950 Postmenopausal bleeding: Secondary | ICD-10-CM

## 2013-04-22 NOTE — Progress Notes (Signed)
This chart was scribed by Jenne Campus, Medical Scribe, for Dr. Mallory Shirk on @DATE @ at 10:49 AM. This chart was reviewed by Dr. Mallory Shirk and is accurate.     [facility] Visit  Patient name: Michaela Peterson MRN [id number]  Date of birth: 02-12-1956  CC & HPI:  Michaela Peterson is a 58 y.o. female presenting today for vaginal bleeding after PAP last year (from March to September) done at [facility] office. She has a h/o fibroids. She had an endometrial biopsy in 2012 that was normal. She had an [place] in 2013 that showed a thin, normal endometrial lining.  ROS:  Vaginal bleeding last year after PAP, none since   Pertinent History Reviewed:  Medical & Surgical Hx:  Reviewed: Significant for fibroids Medications: Reviewed & Updated - see associated section Social History: Reviewed -  reports that she has never smoked. She has never used smokeless tobacco.  Objective Findings:  Vitals: BP 140/76  Ht 5' 1.5" (1.562 m)  Wt 194 lb (87.998 kg)  BMI 36.07 kg/m2  Physical Examination: General appearance - alert, well appearing, and in no distress and oriented to person, place, and time Mental status - alert, oriented to person, place, and time, normal mood, behavior, speech, dress, motor activity, and thought processes   Assessment & Plan:  Postmenopausal bleeding in 2014,  Hx fibroids , hx benign endometrial biopsy 2012 for irreg bleeding  Pt to return on 04/30/13 for an US transvaginal to assess endometrium

## 2013-04-23 ENCOUNTER — Other Ambulatory Visit: Payer: Self-pay

## 2013-04-23 DIAGNOSIS — I10 Essential (primary) hypertension: Secondary | ICD-10-CM

## 2013-04-23 MED ORDER — TRIAMTERENE-HCTZ 37.5-25 MG PO TABS: 1.0000 | ORAL_TABLET | Freq: Every day | ORAL | Status: DC

## 2013-04-30 ENCOUNTER — Other Ambulatory Visit: Payer: BC Managed Care – PPO

## 2013-05-04 ENCOUNTER — Other Ambulatory Visit: Payer: BC Managed Care – PPO

## 2013-05-06 ENCOUNTER — Ambulatory Visit (INDEPENDENT_AMBULATORY_CARE_PROVIDER_SITE_OTHER): Payer: BC Managed Care – PPO | Admitting: Obstetrics and Gynecology

## 2013-05-06 ENCOUNTER — Ambulatory Visit (INDEPENDENT_AMBULATORY_CARE_PROVIDER_SITE_OTHER): Payer: BC Managed Care – PPO

## 2013-05-06 ENCOUNTER — Other Ambulatory Visit: Payer: Self-pay | Admitting: Obstetrics and Gynecology

## 2013-05-06 ENCOUNTER — Encounter: Payer: Self-pay | Admitting: Obstetrics and Gynecology

## 2013-05-06 VITALS — BP 152/62 | Ht 61.0 in | Wt 193.0 lb

## 2013-05-06 DIAGNOSIS — N950 Postmenopausal bleeding: Secondary | ICD-10-CM

## 2013-05-06 DIAGNOSIS — D259 Leiomyoma of uterus, unspecified: Secondary | ICD-10-CM

## 2013-05-06 NOTE — Progress Notes (Signed)
This chart was scribed by Jenne Campus, Medical Scribe, for Dr. Mallory Shirk on 05/06/13 at 11:54 AM. This chart was reviewed by Dr. Mallory Shirk and is accurate.    [facility] Visit  Patient name: Michaela Peterson MRN [id number]  Date of birth: Dec 07, 1955  CC & HPI:  Michaela Peterson is a 58 y.o. female presenting today for U/S for uterine fibroids. She denies having any further vaginal bleeding. Last PAP was 06/03/12. Last biopsy was 3 years ago for the same problem, endometrial bx was "benign Proliferative Endometrium. ROS:  Denies any complaints  Pertinent History Reviewed:  Medical & Surgical Hx:  Reviewed: Significant for uterine fibroids Medications: Reviewed & Updated - see associated section Social History: Reviewed -  reports that she has never smoked. She has never used smokeless tobacco.  Objective Findings:  Vitals: BP 152/62  Ht 5\' 1"  (1.549 m)  Wt 193 lb (87.544 kg)  BMI 36.49 kg/m2  LMP 08/18/2012  Physical Examination: General appearance - alert, well appearing, and in no distress and oriented to person, place, and time Mental status - alert, oriented to person, place, and time, normal mood, behavior, speech, dress, motor activity, and thought processes  U/S 05/06/13 Michaela Peterson is a 58 y.o.for a pelvic sonogram for post menopausal bleeding and h/o fibroid.  Uterus 8.4 x 5.5 x 5.0 cm, anteverted, 1 fibroid noted within posterior fundus= 2.3 x 1.7cm  Endometrium 2.6 mm, distorted anterior by posterior fibroid  Right ovary 2.7 x 1.6 x 1.0 cm,  Left ovary 2.9 x 1.8 x 1.6 cm,  No free fluid or adnexal masses noted within pelvis  Technician Comments:  Anteverted uterus noted with posterior fundal fibroid noted, endom=2.7mm, bilateral adnexa/ovaries appears wnl, no free fluid or adnexal masses noted within pelvis   Assessment & Plan:  A: Postmenopausal bleeding , recurrent, with benign u/s P: endometrium biopsy in 4 weeks, with ECC at same visit.

## 2013-05-06 NOTE — Patient Instructions (Signed)
Take some Motrin before your visit to help with discomfort Endometrial Biopsy Endometrial biopsy is a procedure in which a tissue sample is taken from inside the uterus. The tissue sample is then looked at under a microscope to see if the tissue is normal or abnormal. The endometrium is the lining of the uterus. This procedure helps determine where you are in your menstrual cycle and how hormone levels are affecting the lining of the uterus. This procedure may also be used to evaluate uterine bleeding or to diagnose endometrial cancer, tuberculosis, polyps, or inflammatory conditions.  LET [facility] CARE PROVIDER KNOW ABOUT:  Any allergies you have.  All medicines you are taking, including vitamins, herbs, eye drops, creams, and over-the-counter medicines.  Previous problems you or members of your family have had with the use of anesthetics.  Any blood disorders you have.  Previous surgeries you have had.  Medical conditions you have.  Possibility of pregnancy. RISKS AND COMPLICATIONS Generally, this is a safe procedure. However, as with any procedure, complications can occur. Possible complications include:  Bleeding.  Pelvic infection.  Puncture of the uterine wall with the biopsy device (rare). BEFORE THE PROCEDURE   Keep a record of your menstrual cycles as directed by your health care provider. You may need to schedule your procedure for a specific time in your cycle.  You may want to bring a sanitary pad to wear home after the procedure.  Arrange for someone to drive you home after the procedure if you will be given a medicine to help you relax (sedative). PROCEDURE   You may be given a sedative to relax you.  You will lie on an exam table with your feet and legs supported as in a pelvic exam.  Your health care provider will insert an instrument (speculum) into your vagina to see your cervix.  Your cervix will be cleansed with an antiseptic solution. A medicine  (local anesthetic) will be used to numb the cervix.  A forceps instrument (tenaculum) will be used to hold your cervix steady for the biopsy.  A thin, rodlike instrument (uterine sound) will be inserted through your cervix to determine the length of your uterus and the location where the biopsy sample will be removed.  A thin, flexible tube (catheter) will be inserted through your cervix and into the uterus. The catheter is used to collect the biopsy sample from your endometrial tissue.  The catheter and speculum will then be removed, and the tissue sample will be sent to a lab for examination. AFTER THE PROCEDURE  You will rest in a recovery area until you are ready to go home.  You may have mild cramping and a small amount of vaginal bleeding for a few days after the procedure. This is normal.  Make sure you find out how to get your test results. Document Released: 06/29/2004 Document Revised: 10/29/2012 Document Reviewed: 08/13/2012 [facility] Patient Information 2014 [place], [place].

## 2013-05-28 LAB — HM DIABETES EYE EXAM

## 2013-06-03 ENCOUNTER — Other Ambulatory Visit: Payer: BC Managed Care – PPO | Admitting: Obstetrics and Gynecology

## 2013-07-06 ENCOUNTER — Other Ambulatory Visit: Payer: Self-pay

## 2013-07-06 ENCOUNTER — Telehealth: Payer: Self-pay

## 2013-07-06 ENCOUNTER — Other Ambulatory Visit: Payer: Self-pay | Admitting: Family Medicine

## 2013-07-06 MED ORDER — GLIPIZIDE 10 MG PO TABS: 10.0000 mg | ORAL_TABLET | Freq: Two times a day (BID) | ORAL | Status: DC

## 2013-07-06 NOTE — Telephone Encounter (Signed)
Med sent.

## 2013-07-10 ENCOUNTER — Telehealth: Payer: Self-pay | Admitting: Family Medicine

## 2013-07-10 DIAGNOSIS — L0390 Cellulitis, unspecified: Secondary | ICD-10-CM

## 2013-07-10 MED ORDER — MUPIROCIN CALCIUM 2 % EX CREA: TOPICAL_CREAM | CUTANEOUS | Status: AC

## 2013-07-10 NOTE — Telephone Encounter (Signed)
Patient states that she has a boil in her groin area as she has had in the past.  Area is draining clear fluid and is sore to the touch.  No redness or warmth.  Would like to know if a cream that was prescribed in the past can be refilled to [facility].

## 2013-07-10 NOTE — Telephone Encounter (Signed)
Med sent to pharmacy.  Patient aware to check with pharmacy this evening.

## 2013-07-10 NOTE — Telephone Encounter (Signed)
pls re send /reorder [place] ordered since 2012, same direction, no refill and let her know , thanks

## 2013-07-10 NOTE — Addendum Note (Signed)
Addended by: Denman George B on: 07/10/2013 02:17 PM   Modules accepted: Orders

## 2013-07-15 ENCOUNTER — Other Ambulatory Visit: Payer: Self-pay | Admitting: Family Medicine

## 2013-07-15 DIAGNOSIS — Z1231 Encounter for screening mammogram for malignant neoplasm of breast: Secondary | ICD-10-CM

## 2013-07-27 ENCOUNTER — Ambulatory Visit (INDEPENDENT_AMBULATORY_CARE_PROVIDER_SITE_OTHER): Payer: BC Managed Care – PPO | Admitting: Nurse Practitioner

## 2013-07-27 ENCOUNTER — Encounter: Payer: Self-pay | Admitting: Nurse Practitioner

## 2013-07-27 ENCOUNTER — Telehealth: Payer: Self-pay | Admitting: Family Medicine

## 2013-07-27 VITALS — BP 140/80 | HR 59 | Ht 61.5 in | Wt 201.0 lb

## 2013-07-27 DIAGNOSIS — G35 Multiple sclerosis: Secondary | ICD-10-CM

## 2013-07-27 NOTE — Progress Notes (Signed)
[facility]  PATIENT: Michaela Peterson DOB: [birth year]   REASON FOR VISIT: follow up for MS   HISTORY OF PRESENT ILLNESS: Michaela Peterson, 58 year old female returns for followup. She was last seen 07/25/2012. She has a history of multiple sclerosis without significant clinical progression. Last MRI of the brain in  2011 did not show progression from previous, she is not interested in resuming any MS treatment even after discussing the importance of maintaining her current functional status. She is a prior patient of Dr. Doy Mince who left our practice. She was diagnosed in 2004 on the basis of demyelinating plaques in the cervical spine and typical CSF findings. She was initially treated with Rebif but discontinued the drug due to her injection site reactions in April of 2005. She has not been on disease modifying treatment since that time, she reports some numbness and tingling in the feet which she feels has been stable for many years. She denies any double vision, dysarthria, dysphagia, bowel or bladder difficulties. She has not had any falls. She denies any balance issues. She has multiple other chronic conditions including diabetes which is not in good control. She returns for reevaluation   REVIEW OF SYSTEMS: Full 14 system review of systems performed and notable only for those listed, all others are neg:  Constitutional: N/A  Cardiovascular: Swelling legs, palpitations Ear/Nose/Throat: N/A  Skin: N/A  Eyes: N/A  Respiratory: N/A  Gastroitestinal: N/A  Hematology/Lymphatic: N/A  Endocrine: N/A Musculoskeletal: Joint pain, aching muscles Allergy/Immunology: Seasonal allergies Neurological: Numbness Psychiatric: N/A Sleep : NA   ALLERGIES: Allergies  Allergen Reactions  . Ace Inhibitors Cough  . Aspirin     REACTION: unknown reaction  . Oxycodone-Acetaminophen   . Penicillins   . Statins     NAFLD    HOME MEDICATIONS: Outpatient Prescriptions Prior to Visit    Medication Sig Dispense Refill  . cycloSPORINE (RESTASIS) 0.05 % ophthalmic emulsion 1 drop as directed.        . Dapagliflozin Propanediol 10 MG TABS Take 1 tablet by mouth daily with breakfast.  30 tablet  4  . docusate sodium (COLACE) 50 MG capsule Take by mouth daily as needed.        Marland Kitchen glipiZIDE (GLUCOTROL) 10 MG tablet Take 1 tablet (10 mg total) by mouth 2 (two) times daily before a meal.  180 tablet  0  . glucose blood (RELION CONFIRM/MICRO TEST) test strip Use one strip to test blood sugar twice daily dx 250.00  100 each  5  . ibuprofen (ADVIL,MOTRIN) 800 MG tablet Take 1 tablet (800 mg total) by mouth every 8 (eight) hours as needed.  30 tablet  0  . loratadine (CLARITIN) 10 MG tablet TAKE 1 TABLET BY MOUTH EVERY DAY  90 tablet  1  . mupirocin cream (BACTROBAN) 2 % Apply to affected area 3 times daily  30 g  0  . NIFEdipine (ADALAT CC) 90 MG 24 hr tablet Take 1 tablet (90 mg total) by mouth daily.  90 tablet  3  . Olopatadine HCl (PATANASE) 0.6 % SOLN Place into the nose.      . pantoprazole (PROTONIX) 40 MG tablet TAKE 1 TABLET EVERY DAY  30 tablet  3  . RELION CONFIRM/MICRO TEST test strip USE ONE STRIP TO TEST BLOOD SUGAR TWICE DAILY  60 each  5  . RELION ULTRA THIN PLUS LANCETS MISC USE ONE LANCET TO TEST BLOOD SUGAR TWICE DAILY  100 each  4  . triamterene-hydrochlorothiazide (  MAXZIDE-25) 37.5-25 MG per tablet TAKE 1 TABLET BY MOUTH EVERY DAY  30 tablet  2  . diclofenac sodium (VOLTAREN) 1 % GEL Apply 2 g topically 4 (four) times daily.  100 g  3  . SitaGLIPtin-MetFORMIN HCl 50-1000 MG TB24 Take 2 tablets by mouth daily with breakfast.  60 tablet  11   No facility-administered medications prior to visit.    PAST MEDICAL HISTORY: Past Medical History  Diagnosis Date  . Diabetes mellitus, type 2     A1c of 6.9 in 05/2010  . Sleep apnea 2009  . MS (multiple sclerosis)   . GERD (gastroesophageal reflux disease)     GastroparesisS.;small hiatal hernia  . Hypertension   .  Chronic low back pain   . Nonalcoholic fatty liver disease   . Obesity   . Anemia, iron deficiency   . Adnexal mass     Left; followed by Dr. [name]    PAST SURGICAL HISTORY: Past Surgical History  Procedure Laterality Date  . Dilation and curettage of uterus      4  . Laparoscopy abdomen diagnostic      lysis of adhesions  . Tendon lengthening      Left wrist  . Cholecystectomy    . Knee arthroscopy w/ debridement    . Rotator cuff repair    . Bladder suspension    . Cesarean section      x2   . Umbilical hernia repair    . Colonoscopy  2010    polyp biopsy-negative    FAMILY HISTORY: Family History  Problem Relation Age of Onset  . Heart failure Mother   . Hypertension Mother   . Arthritis Mother   . Stroke Father   . Hypertension Sister   . Diabetes Sister   . Heart disease Sister   . Hypertension Son     SOCIAL HISTORY: History   Social History  . Marital Status: Married    Spouse Name: [name]     Number of Children: 4  . Years of Education: 13   Occupational History  . unemployed     Social History Main Topics  . Smoking status: Never Smoker   . Smokeless tobacco: Never Used     Comment: patient lives with a smoker   . Alcohol Use: No  . Drug Use: No  . Sexual Activity: Yes   Other Topics Concern  . Not on file   Social History Narrative   Patient lives at home with husband [name].    Patient has 4 children.    Patient has a some college.    Patient is right handed.    Patient is currently not working.      PHYSICAL EXAM  Filed Vitals:   07/27/13 1022 07/27/13 1027  BP: 192/79 181/81  Pulse: 65 59  Height: 5' 1.5" (1.562 m)   Weight: 201 lb (91.173 kg)    Body mass index is 37.37 kg/(m^2).  Generalized: Well developed, obese female in no acute distress  Head: normocephalic and atraumatic,. Oropharynx benign  Neck: Supple, no carotid bruits  Cardiac: Regular rate rhythm, no murmur  Musculoskeletal: No deformity   Neurological  examination   Mentation: Alert oriented to time, place, history taking. Follows all commands speech and language fluent  Cranial nerve II-XII: Visual acuity 20/20 bilaterally. Fundoscopic exam reveals sharp disc margins.Pupils were equal round reactive to light extraocular movements were full, visual field were full on confrontational test. Facial sensation and strength were normal.  hearing was intact to finger rubbing bilaterally. Uvula tongue midline. head turning and shoulder shrug were normal and symmetric.Tongue protrusion into cheek strength was normal. Motor: normal bulk and tone, full strength in the BUE, BLE, fine finger movements normal, no pronator drift. No focal weakness Sensory: normal and symmetric to light touch, pinprick, and  vibration in the upper and lower extremities Coordination: finger-nose-finger, heel-to-shin bilaterally, no dysmetria Reflexes: Brachioradialis 2/2, biceps 2/2, triceps 2/2, patellar 2/2, Achilles absent, plantar responses were flexor bilaterally. Gait and Station: Rising up from seated position without assistance, normal stance,  moderate stride, good arm swing, smooth turning, able to perform tiptoe, and heel walking without difficulty. Tandem gait is steady  DIAGNOSTIC DATA (LABS, IMAGING, TESTING) - I reviewed patient records, labs, notes, testing and imaging myself where available.      Component Value Date/Time   NA 139 04/08/2013 1104   K 4.4 04/08/2013 1104   CL 104 04/08/2013 1104   CO2 27 04/08/2013 1104   GLUCOSE 108* 04/08/2013 1104   BUN 16 04/08/2013 1104   CREATININE 0.77 04/08/2013 1104   CREATININE 0.68 11/28/2009 2005   CALCIUM 9.9 04/08/2013 1104   PROT 7.8 04/08/2013 1104   ALBUMIN 4.5 04/08/2013 1104   AST 30 04/08/2013 1104   ALT 44* 04/08/2013 1104   ALKPHOS 87 04/08/2013 1104   BILITOT 0.3 04/08/2013 1104   GFRNONAA 86 04/08/2013 1104   GFRNONAA >60 04/13/2009 1046   GFRAA >89 04/08/2013 1104   GFRAA  Value: >60        The eGFR has been  calculated using the MDRD equation. This calculation has not been validated in all clinical situations. eGFR's persistently <60 mL/min signify possible Chronic Kidney Disease. 04/13/2009 1046   Lab Results  Component Value Date   CHOL 166 04/08/2013   HDL 61 04/08/2013   LDLCALC 93 04/08/2013   TRIG 60 04/08/2013   CHOLHDL 2.7 04/08/2013   Lab Results  Component Value Date   HGBA1C 7.5* 04/08/2013       ASSESSMENT AND PLAN  58 y.o. year old female  has a past medical history of  Sleep apnea (2009); MS (multiple sclerosis); diagnosed in 2004 and has been off of disease modifying therapy since April 2005. Most  recent MRI of the brain in 2011 did not show any progression of the disease since 2007 MRI of the brain. Discussed long-term progression to disability without treatment.    Discussed disease modifying agents, specifically oral preparations that are now available but patient is not interested Would like to repeat MRI of the brain and compare to 2011 which was the last MRI of the brain , patient will call me back if she wants to have this done She will followup yearly and when necessary Dennie Bible, [facility], [facility], [facility] [address] [place], [place] [address] [phone]

## 2013-07-27 NOTE — Telephone Encounter (Signed)
Pt is advised to Call ins and see if kombiglyze is covered or what will cover in  it's place,( pt advised to do this) and she is to call back tomorrow with the info. If you have access to her formulary pls check if she still has no answer by Wednesday  (If unable to get coverage for combination drug them metformin 1000mg  twice daily can be sent, but PLS let us discuss this before changing to that , less effective)

## 2013-07-27 NOTE — Patient Instructions (Signed)
Discussed disease modifying agents, specifically oral preparations that are now available but patient is not interested Would like to repeat MRI of the brain and compare to 2011 which was the last MRI of the brain , patient will call me back if she wants to have this done She will followup yearly and when necessary

## 2013-07-28 NOTE — Telephone Encounter (Signed)
The kombiglyze is covered but she would still have to pay an $80 copay, as well as with the janumet. Wants to know if she can have glipizide or metformin and she will really watch her diet. Please advise

## 2013-07-28 NOTE — Telephone Encounter (Signed)
Ok metformin 1000mg  one twice daily ,Pls verify still taking farxiga 10mg  daily. Stop janumet, get hBa1C which is past due and will see if she also needs the glipizide . Needs to test daily and call if BG is higher than goal consisitently

## 2013-07-29 ENCOUNTER — Telehealth: Payer: Self-pay | Admitting: Family Medicine

## 2013-07-29 ENCOUNTER — Other Ambulatory Visit: Payer: Self-pay

## 2013-07-29 MED ORDER — METFORMIN HCL 1000 MG PO TABS
1000.0000 mg | ORAL_TABLET | Freq: Two times a day (BID) | ORAL | Status: DC
Start: 2013-07-29 — End: 2014-01-21

## 2013-07-29 NOTE — Telephone Encounter (Signed)
Patient aware if too expensive that it can be sent to [facility]

## 2013-07-29 NOTE — Telephone Encounter (Signed)
Patient aware and new med sent in. Still on farxiga

## 2013-08-12 ENCOUNTER — Ambulatory Visit (INDEPENDENT_AMBULATORY_CARE_PROVIDER_SITE_OTHER): Payer: BC Managed Care – PPO | Admitting: Family Medicine

## 2013-08-12 ENCOUNTER — Encounter: Payer: Self-pay | Admitting: Family Medicine

## 2013-08-12 VITALS — BP 124/80 | HR 98 | Resp 16 | Wt 203.0 lb

## 2013-08-12 DIAGNOSIS — J309 Allergic rhinitis, unspecified: Secondary | ICD-10-CM

## 2013-08-12 DIAGNOSIS — K219 Gastro-esophageal reflux disease without esophagitis: Secondary | ICD-10-CM

## 2013-08-12 DIAGNOSIS — E1165 Type 2 diabetes mellitus with hyperglycemia: Principal | ICD-10-CM

## 2013-08-12 DIAGNOSIS — E1149 Type 2 diabetes mellitus with other diabetic neurological complication: Secondary | ICD-10-CM

## 2013-08-12 DIAGNOSIS — K3189 Other diseases of stomach and duodenum: Secondary | ICD-10-CM

## 2013-08-12 DIAGNOSIS — G909 Disorder of the autonomic nervous system, unspecified: Secondary | ICD-10-CM

## 2013-08-12 DIAGNOSIS — R1013 Epigastric pain: Secondary | ICD-10-CM

## 2013-08-12 DIAGNOSIS — E1143 Type 2 diabetes mellitus with diabetic autonomic (poly)neuropathy: Secondary | ICD-10-CM

## 2013-08-12 DIAGNOSIS — R110 Nausea: Secondary | ICD-10-CM

## 2013-08-12 DIAGNOSIS — R0789 Other chest pain: Secondary | ICD-10-CM

## 2013-08-12 DIAGNOSIS — I10 Essential (primary) hypertension: Secondary | ICD-10-CM

## 2013-08-12 DIAGNOSIS — IMO0002 Reserved for concepts with insufficient information to code with codable children: Secondary | ICD-10-CM

## 2013-08-12 LAB — COMPLETE METABOLIC PANEL WITH GFR
ALT: 54 U/L — ABNORMAL HIGH (ref 0–35)
AST: 35 U/L (ref 0–37)
Albumin: 4.5 g/dL (ref 3.5–5.2)
Alkaline Phosphatase: 87 U/L (ref 39–117)
BUN: 19 mg/dL (ref 6–23)
CO2: 24 mEq/L (ref 19–32)
Calcium: 9.8 mg/dL (ref 8.4–10.5)
Chloride: 98 mEq/L (ref 96–112)
Creat: 0.86 mg/dL (ref 0.50–1.10)
GFR, Est African American: 87 mL/min
GFR, Est Non African American: 75 mL/min
Glucose, Bld: 166 mg/dL — ABNORMAL HIGH (ref 70–99)
Potassium: 4.4 mEq/L (ref 3.5–5.3)
Sodium: 139 mEq/L (ref 135–145)
Total Bilirubin: 0.5 mg/dL (ref 0.2–1.2)
Total Protein: 7.7 g/dL (ref 6.0–8.3)

## 2013-08-12 LAB — LIPASE: Lipase: 61 U/L — ABNORMAL HIGH (ref 11–59)

## 2013-08-12 LAB — POCT URINALYSIS DIPSTICK
Bilirubin, UA: NEGATIVE
Blood, UA: NEGATIVE
Glucose, UA: 500
Ketones, UA: NEGATIVE
Leukocytes, UA: NEGATIVE
Nitrite, UA: NEGATIVE
Protein, UA: NEGATIVE
Spec Grav, UA: 1.015
Urobilinogen, UA: 0.2
pH, UA: 6

## 2013-08-12 LAB — GLUCOSE, POCT (MANUAL RESULT ENTRY): POC Glucose: 190 mg/dl — AB (ref 70–99)

## 2013-08-12 LAB — HEMOGLOBIN A1C
Hgb A1c MFr Bld: 9.2 % — ABNORMAL HIGH (ref <5.7)
Mean Plasma Glucose: 217 mg/dL — ABNORMAL HIGH (ref <117)

## 2013-08-12 LAB — AMYLASE: Amylase: 57 U/L (ref 0–105)

## 2013-08-12 MED ORDER — ONDANSETRON HCL 4 MG/2ML IJ SOLN
4.0000 mg | Freq: Once | INTRAMUSCULAR | Status: AC
  Administered 2013-08-12: 4 mg via INTRAMUSCULAR

## 2013-08-12 MED ORDER — ONDANSETRON HCL 4 MG PO TABS: 4.0000 mg | ORAL_TABLET | Freq: Three times a day (TID) | ORAL | Status: DC | PRN

## 2013-08-12 NOTE — Progress Notes (Signed)
   Subjective:    Patient ID: Michaela Peterson, female    DOB: 05-23-1955, 58 y.o.   MRN: [id number]  HPI  The PT is here for follow up and re-evaluation of chronic medical conditions, medication management and review of any available recent lab and radiology data.  Preventive health is updated, specifically  Cancer screening and Immunization.   4 day h/o intermittent LUQ and epigastric burning pain duration 2 to 3 mins once daily for the past  4days,  Nausea this morning, no change in BM, No fever  Or chills has hot flashes LLQ pain intermittently x 4 day Left knee pain and instability still holding out on surgery Increased and uncontrolled allergy symptoms , not using medication daily , for past 1 month     Review of Systems See HPI Denies recent fever or chills.But feels acutely ill on day of visit and generally states she has fe;ly excessively tired in past month. Blood sugar when testd is seldom under 200, inconsitent with both diet and medciation Denies ear pain or sore throat Denies chest congestion, productive cough or wheezing. C/o central chest pain x 1 day, no lightheadedness or diaphoresis, more associated with gI symptoms. No aggravation by physical activity. Denies PND, orthopnea palpitations and leg swelling   Denies dysuria, frequency, hesitancy or incontinence. . Denies headaches, seizures, numbness, or tingling. Denies depression, anxiety or insomnia. Denies skin break down or rash.        Objective:   Physical Exam BP 124/80  Pulse 98  Resp 16  Wt 203 lb (92.08 kg)  SpO2 100%  LMP 08/18/2012 Patient alert and oriented and in no cardiopulmonary distress.Ill appearing , visible weight loss since last visit, likley due to uncontrolled BG  HEENT: No facial asymmetry, EOMI,   oropharynx pink and moist.  Neck supple no JVD, no mass.  Chest: Clear to auscultation bilaterally.no reproducible chest wall tenderness  CVS: S1, S2 no murmurs, no S3. EKG: NSR no  ischemia  ABD: Soft  Epigastric tenderness, no rebound mild guarding, no mass or organomegaly. BS normal  Ext: No edema  MS: Adequate ROM spine, shoulders, hips and knees.  Skin: Intact, no ulcerations or rash noted.  Psych: Good eye contact, normal affect. Memory intact  anxious not  depressed appearing.  CNS: CN 2-12 intact, power,  normal throughout.no focal deficits noted.        Assessment & Plan:  Nausea alone Acute onset nausea and central abdominal pain, zofran administered, labs to eval for acute pancreatitis, H pylori and LFT  Uncontrolled diabetes mellitus with peripheral autonomic neuropathy Deteriorated, non compliance continues to detriment of pt's health, with both medication and ndiet, cost is not a barrier reportedly F/u in 6 weeks with log Patient advised to reduce carb and sweets, commit to regular physical activity, take meds as prescribed, test blood as directed, and attempt to lose weight, to improve blood sugar control.   HYPERTENSION Controlled, no change in medication DASH diet and commitment to daily physical activity for a minimum of 30 minutes discussed and encouraged, as a part of hypertension management. The importance of attaining a healthy weight is also discussed.   Atypical chest pain High risk for CAD and new epigastric pain radiating to both sides of chest EKG in office negative for acute ischemia , rhythm is regular  GERD Uncontrolled  With avcute nausea, refer for GI eval  ALLERGIC RHINITIS Uncontrolled needs to use medication daily, re educated.

## 2013-08-12 NOTE — Patient Instructions (Addendum)
F/u 2nd week in July, call if you need me befre  New medication to be taken daily  For nausea as needed, and heartburn/GERD every day  You are referred to  GI for further evaluation  Labs today stat amylase, lipase troponin , also H pylori not stat   EKG today shows no evidence of heart damage and rhythm is regular  If you feel worse or no better you need to go to the ED   Use the 2 medications for allergies every day , nasal spray and claritin   Blood sugar is too high, and this will make you tired, Take medication every day as prescribed and eat healthily. Record blood sugars daily and return with meter and log, call if blood sugars remain high

## 2013-08-13 LAB — TROPONIN I: Troponin I: <0.01 ng/mL (ref <0.06)

## 2013-08-13 LAB — H. PYLORI ANTIBODY, IGG: H Pylori IgG: <0.4 {ISR}

## 2013-08-16 DIAGNOSIS — R0789 Other chest pain: Secondary | ICD-10-CM | POA: Insufficient documentation

## 2013-08-16 DIAGNOSIS — R079 Chest pain, unspecified: Secondary | ICD-10-CM | POA: Insufficient documentation

## 2013-08-16 NOTE — Assessment & Plan Note (Signed)
Deteriorated, non compliance continues to detriment of pt's health, with both medication and ndiet, cost is not a barrier reportedly F/u in 6 weeks with log Patient advised to reduce carb and sweets, commit to regular physical activity, take meds as prescribed, test blood as directed, and attempt to lose weight, to improve blood sugar control.

## 2013-08-16 NOTE — Assessment & Plan Note (Signed)
Uncontrolled  With avcute nausea, refer for GI eval

## 2013-08-16 NOTE — Assessment & Plan Note (Signed)
High risk for CAD and new epigastric pain radiating to both sides of chest EKG in office negative for acute ischemia , rhythm is regular

## 2013-08-16 NOTE — Assessment & Plan Note (Signed)
Acute onset nausea and central abdominal pain, zofran administered, labs to eval for acute pancreatitis, H pylori and LFT

## 2013-08-16 NOTE — Assessment & Plan Note (Signed)
Uncontrolled needs to use medication daily, re educated.

## 2013-08-16 NOTE — Assessment & Plan Note (Signed)
Controlled, no change in medication DASH diet and commitment to daily physical activity for a minimum of 30 minutes discussed and encouraged, as a part of hypertension management. The importance of attaining a healthy weight is also discussed.  

## 2013-08-17 ENCOUNTER — Other Ambulatory Visit: Payer: Self-pay | Admitting: *Deleted

## 2013-08-18 ENCOUNTER — Ambulatory Visit: Payer: BC Managed Care – PPO | Admitting: Gastroenterology

## 2013-08-20 ENCOUNTER — Ambulatory Visit (HOSPITAL_COMMUNITY)
Admission: RE | Admit: 2013-08-20 | Discharge: 2013-08-20 | Disposition: A | Discharge status: Home / Self Care | Payer: BC Managed Care – PPO | Source: Ambulatory Visit | Attending: Family Medicine | Admitting: Family Medicine

## 2013-08-20 DIAGNOSIS — Z1231 Encounter for screening mammogram for malignant neoplasm of breast: Secondary | ICD-10-CM | POA: Insufficient documentation

## 2013-08-21 NOTE — Progress Notes (Signed)
I reviewed note and agree with plan.   Tiant Peixoto R. Laroy Mustard, MD  Certified in Neurology, Neurophysiology and Neuroimaging  [facility] [address], [address] [place], [place] [address] [phone]   

## 2013-08-26 ENCOUNTER — Encounter: Payer: Self-pay | Admitting: Family Medicine

## 2013-09-03 ENCOUNTER — Other Ambulatory Visit: Payer: Self-pay | Admitting: Family Medicine

## 2013-09-08 ENCOUNTER — Other Ambulatory Visit: Payer: Self-pay | Admitting: Family Medicine

## 2013-09-15 ENCOUNTER — Ambulatory Visit: Payer: BC Managed Care – PPO | Admitting: Family Medicine

## 2013-09-17 ENCOUNTER — Encounter: Payer: Self-pay | Admitting: Gastroenterology

## 2013-09-17 ENCOUNTER — Ambulatory Visit (INDEPENDENT_AMBULATORY_CARE_PROVIDER_SITE_OTHER): Payer: BC Managed Care – PPO | Admitting: Gastroenterology

## 2013-09-17 VITALS — BP 129/79 | HR 69 | Temp 97.4°F | Ht 61.0 in | Wt 189.4 lb

## 2013-09-17 DIAGNOSIS — D509 Iron deficiency anemia, unspecified: Secondary | ICD-10-CM

## 2013-09-17 DIAGNOSIS — K3189 Other diseases of stomach and duodenum: Secondary | ICD-10-CM

## 2013-09-17 DIAGNOSIS — R1013 Epigastric pain: Secondary | ICD-10-CM | POA: Insufficient documentation

## 2013-09-17 DIAGNOSIS — D369 Benign neoplasm, unspecified site: Secondary | ICD-10-CM

## 2013-09-17 MED ORDER — DEXLANSOPRAZOLE 60 MG PO CPDR: 60.0000 mg | DELAYED_RELEASE_CAPSULE | Freq: Every day | ORAL | Status: DC

## 2013-09-17 NOTE — Patient Instructions (Signed)
Please complete the blood work today.   Stop Protonix for now. Start taking samples of Dexilant once daily in the morning. I sent the prescription to your pharmacy as well. Hopefully, your insurance will cover this. If not, we may need to do a prior authorization.  We have scheduled you for a colonoscopy and upper endoscopy with Dr. [name].

## 2013-09-17 NOTE — Progress Notes (Signed)
Primary Care Physician:  Tula Nakayama, MD Primary Gastroenterologist:  Dr. [name]   Chief Complaint  Patient presents with  . Abdominal Pain  . Nausea    HPI:   Michaela Peterson presents today with a history of gastroparesis, IDA, mildly elevated transaminases in the setting of NAFLD, chronic abdominal pain. Last EGD in 2010 without acute findings. Last colonoscopy in 2010 with tubular adenoma. Due for surveillance now.  Notes intermittent epigastric pain for a few months. ACute on chronic. Eating stuff that makes her symptoms worse such as tomatoes. Associated nausea, no vomiting. When pain occurs, doesn't want to eat. Associated belching. Drinks a pepsi, starts burping, relieves discomfort. Went off Protonix for awhile and didn't have any trouble, now back on it for several months. Still with persistent pain despite PPI. Taking every day. Every blue moon will take Ibuprofen. No aspirin powders. No dysphagia. No melena. No hematochezia. No constipation, diarrhea.   Past Medical History  Diagnosis Date  . Diabetes mellitus, type 2     A1c of 6.9 in 05/2010  . Sleep apnea 2009  . MS (multiple sclerosis)   . GERD (gastroesophageal reflux disease)     GastroparesisS.;small hiatal hernia  . Hypertension   . Chronic low back pain   . Nonalcoholic fatty liver disease   . Obesity   . Anemia, iron deficiency   . Adnexal mass     Left; followed by Dr. [name]  . Gastroparesis     Past Surgical History  Procedure Laterality Date  . Dilation and curettage of uterus      4  . Laparoscopy abdomen diagnostic      lysis of adhesions  . Tendon lengthening      Left wrist  . Cholecystectomy    . Knee arthroscopy w/ debridement    . Rotator cuff repair    . Bladder suspension    . Cesarean section      x2   . Umbilical hernia repair    . Colonoscopy  2010    Dr. [name]: tubular adenoma, few scattered diverticula  . Esophagogastroduodenoscopy  2010    Dr. [name]: normal  esophagus, small hiatal hernia, questionable pale duodenal mucosa but negative for celiac sprue.     Current Outpatient Prescriptions  Medication Sig Dispense Refill  . cycloSPORINE (RESTASIS) 0.05 % ophthalmic emulsion 1 drop as directed.        . Dapagliflozin Propanediol 10 MG TABS Take 1 tablet by mouth daily with breakfast.  30 tablet  4  . diclofenac sodium (VOLTAREN) 1 % GEL Apply 2 g topically as needed.      . docusate sodium (COLACE) 50 MG capsule Take by mouth daily as needed.        Marland Kitchen glipiZIDE (GLUCOTROL) 10 MG tablet Take 1 tablet (10 mg total) by mouth 2 (two) times daily before a meal.  180 tablet  0  . ibuprofen (ADVIL,MOTRIN) 800 MG tablet Take 1 tablet (800 mg total) by mouth every 8 (eight) hours as needed.  30 tablet  0  . loratadine (CLARITIN) 10 MG tablet TAKE 1 TABLET BY MOUTH EVERY DAY  90 tablet  1  . metFORMIN (GLUCOPHAGE) 1000 MG tablet Take 1 tablet (1,000 mg total) by mouth 2 (two) times daily with a meal.  60 tablet  5  . mupirocin cream (BACTROBAN) 2 % Apply to affected area 3 times daily  30 g  0  . NIFEdipine (ADALAT CC) 90 MG 24  hr tablet Take 1 tablet (90 mg total) by mouth daily.  90 tablet  3  . Olopatadine HCl (PATANASE) 0.6 % SOLN Place into the nose.      . ondansetron (ZOFRAN) 4 MG tablet Take 1 tablet (4 mg total) by mouth every 8 (eight) hours as needed for nausea or vomiting.  20 tablet  0  . pantoprazole (PROTONIX) 40 MG tablet TAKE 1 TABLET EVERY DAY  30 tablet  3  . triamterene-hydrochlorothiazide (MAXZIDE-25) 37.5-25 MG per tablet TAKE 1 TABLET BY MOUTH EVERY DAY  30 tablet  2  . dexlansoprazole (DEXILANT) 60 MG capsule Take 1 capsule (60 mg total) by mouth daily.  30 capsule  3  . glucose blood (RELION CONFIRM/MICRO TEST) test strip Use one strip to test blood sugar twice daily dx 250.00  100 each  5  . loratadine (CLARITIN) 10 MG tablet TAKE 1 TABLET BY MOUTH EVERY DAY  90 tablet  0  . RELION CONFIRM/MICRO TEST test strip USE ONE STRIP TO TEST  BLOOD SUGAR TWICE DAILY  60 each  5  . RELION LANCETS THIN 26G MISC USE ONE LANCET TO CHECK GLUCOSE TWICE DAILY  100 each  3   No current facility-administered medications for this visit.    Allergies as of 09/17/2013 - Review Complete 09/17/2013  Allergen Reaction Noted  . Ace inhibitors Cough 03/26/2012  . Aspirin    . Oxycodone-acetaminophen  02/21/2007  . Penicillins    . Statins  10/11/2012    Family History  Problem Relation Age of Onset  . Heart failure Mother   . Hypertension Mother   . Arthritis Mother   . Stroke Father   . Hypertension Sister   . Diabetes Sister   . Heart disease Sister   . Hypertension Son   . Colon cancer Neg Hx     History   Social History  . Marital Status: Married    Spouse Name: [name]     Number of Children: 4  . Years of Education: 13   Occupational History  . unemployed     Social History Main Topics  . Smoking status: Never Smoker   . Smokeless tobacco: Never Used     Comment: patient lives with a smoker   . Alcohol Use: No  . Drug Use: No  . Sexual Activity: Yes   Other Topics Concern  . Not on file   Social History Narrative   Patient lives at home with husband [name].    Patient has 4 children.    Patient has a some college.    Patient is right handed.    Patient is currently not working.     Review of Systems: Gen: Denies any fever, chills, fatigue, weight loss, lack of appetite.  CV: Denies chest pain, heart palpitations, peripheral edema, syncope.  Resp: Denies shortness of breath at rest or with exertion. Denies wheezing or cough.  GI: see HPI GU : Denies urinary burning, urinary frequency, urinary hesitancy MS: +joint pain Derm: Denies rash, itching, dry skin Psych: Denies depression, anxiety, memory loss, and confusion Heme: Denies bruising, bleeding, and enlarged lymph nodes.  Physical Exam: BP 129/79  Pulse 69  Temp(Src) 97.4 F (36.3 C) (Oral)  Ht 5\' 1"  (1.549 m)  Wt 189 lb 6.4 oz (85.911 kg)  BMI  35.81 kg/m2  LMP 08/18/2012 General:   Alert and oriented. Pleasant and cooperative. Well-nourished and well-developed.  Head:  Normocephalic and atraumatic. Eyes:  Without icterus, sclera clear and  conjunctiva pink.  Ears:  Normal auditory acuity. Nose:  No deformity, discharge,  or lesions. Mouth:  No deformity or lesions, oral mucosa pink.  Lungs:  Clear to auscultation bilaterally. No wheezes, rales, or rhonchi. No distress.  Heart:  S1, S2 present without murmurs appreciated.  Abdomen:  +BS, soft, very mild tenderness epigastric/LUQ and non-distended. No HSM noted. No guarding or rebound. No masses appreciated.  Rectal:  Deferred  Msk:  Symmetrical without gross deformities. Normal posture. Extremities:  Without clubbing or edema. Neurologic:  Alert and  oriented x4;  grossly normal neurologically. Skin:  Intact without significant lesions or rashes. Psych:  Alert and cooperative. Normal mood and affect.  Lab Results  Component Value Date   ALT 54* 08/11/2013   AST 35 08/11/2013   ALKPHOS 87 08/11/2013   BILITOT 0.5 08/11/2013    Lab Results  Component Value Date   LIPASE 61* 08/12/2013   Lab Results  Component Value Date   AMYLASE 57 08/12/2013   Lab Results  Component Value Date   CREATININE 0.86 08/11/2013   BUN 19 08/11/2013   NA 139 08/11/2013   K 4.4 08/11/2013   CL 98 08/11/2013   CO2 24 08/11/2013   June 2015: H.pylori serology negative  Lab Results  Component Value Date   WBC 8.1 10/06/2012   HGB 11.5* 10/06/2012   HCT 34.9* 10/06/2012   MCV 77.7* 10/06/2012   PLT 484* 10/06/2012

## 2013-09-17 NOTE — Assessment & Plan Note (Signed)
58 year old female with worsening epigastric pain, acute on chronic but without any acute findings on exam. Overall, labs unimpressive to include H.pylori serology, which is negative. Last EGD in 2010 benign. Likely has chronic abdominal pain, possible NUD, gallbladder absent. Known fatty liver, with intermittent mild transaminitis over the years. Will stop Protonix and trial Dexilant; proceed with EGD with Dr. [name] in near future. CT scan if EGD negative. Risks and benefits discussed in detail with patient, who states understanding.

## 2013-09-17 NOTE — Assessment & Plan Note (Signed)
Last colonoscopy 2010. Proceed with surveillance at time of EGD. Proceed with TCS with Dr. [name] in near future: the risks, benefits, and alternatives have been discussed with the patient in detail. The patient states understanding and desires to proceed.

## 2013-09-17 NOTE — Progress Notes (Signed)
cc'd to pcp 

## 2013-09-17 NOTE — Assessment & Plan Note (Signed)
History of IDA. Check CBC, iron, ferritin now.

## 2013-09-18 LAB — CBC
HCT: 36.6 % (ref 36.0–46.0)
Hemoglobin: 12 g/dL (ref 12.0–15.0)
MCH: 25.2 pg — ABNORMAL LOW (ref 26.0–34.0)
MCHC: 32.8 g/dL (ref 30.0–36.0)
MCV: 76.9 fL — ABNORMAL LOW (ref 78.0–100.0)
Platelets: 455 10*3/uL — ABNORMAL HIGH (ref 150–400)
RBC: 4.76 MIL/uL (ref 3.87–5.11)
RDW: 16.9 % — ABNORMAL HIGH (ref 11.5–15.5)
WBC: 8.5 10*3/uL (ref 4.0–10.5)

## 2013-09-18 LAB — FERRITIN: Ferritin: 24 ng/mL (ref 10–291)

## 2013-09-18 LAB — IRON: Iron: 34 ug/dL — ABNORMAL LOW (ref 42–145)

## 2013-09-22 ENCOUNTER — Ambulatory Visit: Payer: BC Managed Care – PPO | Admitting: Family Medicine

## 2013-09-24 ENCOUNTER — Telehealth: Payer: Self-pay | Admitting: Family Medicine

## 2013-09-24 MED ORDER — PREDNISONE 5 MG PO KIT: PACK | ORAL | Status: DC

## 2013-09-24 MED ORDER — OLOPATADINE HCL 0.6 % NA SOLN: NASAL | Status: DC

## 2013-09-24 NOTE — Telephone Encounter (Signed)
Ensure she is using daily steroid nasal spray like flonase, the patanase nasal spray daily just prescribed , netty pot saline nose flushes and claritin, absolutely needs top do all 4 daily also send in pred 5 mg dose pack #21 for uncontrolled allergies pls, and let her knwio

## 2013-09-24 NOTE — Telephone Encounter (Signed)
Please advise.  Patient missed last appt that was offered.

## 2013-09-24 NOTE — Telephone Encounter (Signed)
Went and seen the eye Dr and he said that she has sinus and she is taking what Dr Moshe Cipro gave her and also suphedrine has helped some but not enough she is going out of town tomorrow and needs something or what else can she do please call patient back

## 2013-09-24 NOTE — Telephone Encounter (Signed)
Patient aware and med sent to pharmacy.  Refill on Patanase sent as well.

## 2013-09-24 NOTE — Addendum Note (Signed)
Addended by: Denman George B on: 09/24/2013 04:04 PM   Modules accepted: Orders

## 2013-09-29 NOTE — Progress Notes (Signed)
Quick Note:  Ferritin low normal. Iron still low. Hgb 12 Recommend iron 325 mg po BID Recheck in 6 months. ______

## 2013-09-30 ENCOUNTER — Encounter (HOSPITAL_COMMUNITY): Payer: Self-pay | Admitting: Pharmacy Technician

## 2013-10-01 ENCOUNTER — Encounter (HOSPITAL_COMMUNITY)
Admission: RE | Disposition: A | Discharge status: Home / Self Care | Payer: Self-pay | Source: Ambulatory Visit | Attending: Internal Medicine

## 2013-10-01 ENCOUNTER — Ambulatory Visit (HOSPITAL_COMMUNITY)
Admission: RE | Admit: 2013-10-01 | Discharge: 2013-10-01 | Disposition: A | Discharge status: Home / Self Care | Payer: BC Managed Care – PPO | Source: Ambulatory Visit | Attending: Internal Medicine | Admitting: Internal Medicine

## 2013-10-01 ENCOUNTER — Encounter (HOSPITAL_COMMUNITY): Payer: Self-pay | Admitting: *Deleted

## 2013-10-01 ENCOUNTER — Telehealth: Payer: Self-pay | Admitting: General Practice

## 2013-10-01 ENCOUNTER — Other Ambulatory Visit: Payer: Self-pay | Admitting: Family Medicine

## 2013-10-01 ENCOUNTER — Other Ambulatory Visit: Payer: Self-pay | Admitting: Internal Medicine

## 2013-10-01 DIAGNOSIS — Z09 Encounter for follow-up examination after completed treatment for conditions other than malignant neoplasm: Secondary | ICD-10-CM | POA: Insufficient documentation

## 2013-10-01 DIAGNOSIS — D509 Iron deficiency anemia, unspecified: Secondary | ICD-10-CM

## 2013-10-01 DIAGNOSIS — E669 Obesity, unspecified: Secondary | ICD-10-CM | POA: Insufficient documentation

## 2013-10-01 DIAGNOSIS — R109 Unspecified abdominal pain: Secondary | ICD-10-CM

## 2013-10-01 DIAGNOSIS — R1013 Epigastric pain: Secondary | ICD-10-CM | POA: Insufficient documentation

## 2013-10-01 DIAGNOSIS — D369 Benign neoplasm, unspecified site: Secondary | ICD-10-CM

## 2013-10-01 DIAGNOSIS — Z79899 Other long term (current) drug therapy: Secondary | ICD-10-CM | POA: Insufficient documentation

## 2013-10-01 DIAGNOSIS — E119 Type 2 diabetes mellitus without complications: Secondary | ICD-10-CM | POA: Insufficient documentation

## 2013-10-01 DIAGNOSIS — K317 Polyp of stomach and duodenum: Secondary | ICD-10-CM

## 2013-10-01 DIAGNOSIS — R110 Nausea: Secondary | ICD-10-CM | POA: Insufficient documentation

## 2013-10-01 DIAGNOSIS — Z6835 Body mass index (BMI) 35.0-35.9, adult: Secondary | ICD-10-CM | POA: Insufficient documentation

## 2013-10-01 DIAGNOSIS — D131 Benign neoplasm of stomach: Secondary | ICD-10-CM

## 2013-10-01 DIAGNOSIS — I10 Essential (primary) hypertension: Secondary | ICD-10-CM | POA: Insufficient documentation

## 2013-10-01 DIAGNOSIS — K3189 Other diseases of stomach and duodenum: Secondary | ICD-10-CM

## 2013-10-01 DIAGNOSIS — Z86010 Personal history of colonic polyps: Secondary | ICD-10-CM

## 2013-10-01 HISTORY — PX: ESOPHAGOGASTRODUODENOSCOPY: SHX5428

## 2013-10-01 HISTORY — PX: COLONOSCOPY: SHX5424

## 2013-10-01 LAB — GLUCOSE, CAPILLARY: Glucose-Capillary: 113 mg/dL — ABNORMAL HIGH (ref 70–99)

## 2013-10-01 SURGERY — COLONOSCOPY
Anesthesia: Moderate Sedation

## 2013-10-01 MED ORDER — MIDAZOLAM HCL 5 MG/5ML IJ SOLN
INTRAMUSCULAR | Status: AC
  Filled 2013-10-01: qty 10

## 2013-10-01 MED ORDER — MIDAZOLAM HCL 5 MG/5ML IJ SOLN
INTRAMUSCULAR | Status: DC | PRN
  Administered 2013-10-01 (×2): 1 mg via INTRAVENOUS
  Administered 2013-10-01: 2 mg via INTRAVENOUS

## 2013-10-01 MED ORDER — STERILE WATER FOR IRRIGATION IR SOLN
Status: DC | PRN
  Administered 2013-10-01: 13:00:00

## 2013-10-01 MED ORDER — LIDOCAINE VISCOUS 2 % MT SOLN
OROMUCOSAL | Status: AC
  Filled 2013-10-01: qty 15

## 2013-10-01 MED ORDER — ONDANSETRON HCL 4 MG/2ML IJ SOLN
INTRAMUSCULAR | Status: AC
  Filled 2013-10-01: qty 2

## 2013-10-01 MED ORDER — SODIUM CHLORIDE 0.9 % IV SOLN
INTRAVENOUS | Status: DC
  Administered 2013-10-01: 12:00:00 via INTRAVENOUS

## 2013-10-01 MED ORDER — MEPERIDINE HCL 100 MG/ML IJ SOLN
INTRAMUSCULAR | Status: DC | PRN
  Administered 2013-10-01: 25 mg via INTRAVENOUS
  Administered 2013-10-01: 50 mg via INTRAVENOUS

## 2013-10-01 MED ORDER — LIDOCAINE VISCOUS 2 % MT SOLN
OROMUCOSAL | Status: DC | PRN
  Administered 2013-10-01: 3 mL via OROMUCOSAL

## 2013-10-01 MED ORDER — MEPERIDINE HCL 100 MG/ML IJ SOLN
INTRAMUSCULAR | Status: AC
  Filled 2013-10-01: qty 2

## 2013-10-01 MED ORDER — ONDANSETRON HCL 4 MG/2ML IJ SOLN
INTRAMUSCULAR | Status: DC | PRN
  Administered 2013-10-01: 4 mg via INTRAVENOUS

## 2013-10-01 NOTE — Op Note (Signed)
[facility] [address] [place], [address]   ENDOSCOPY PROCEDURE REPORT  PATIENT: Michaela Peterson, Michaela Peterson  MR#: [id number] BIRTHDATE: 09-16-1955 , 38  yrs. old GENDER: Female ENDOSCOPIST: R.  Garfield Cornea, MD FACP FACG REFERRED BY:  Tula Nakayama, M.D. PROCEDURE DATE:  10/01/2013 PROCEDURE:     EGD with gastric biopsy  INDICATIONS:     Epigastric pain.  INFORMED CONSENT:   The risks, benefits, limitations, alternatives and imponderables have been discussed.  The potential for biopsy, esophogeal dilation, etc. have also been reviewed.  Questions have been answered.  All parties agreeable.  Please see the history and physical in the medical record for more information.  MEDICATIONS:     Versed 3 mg IV and Demerol 75 mg IV in divided doses. Xylocaine gel orally. Zofran 4 mg IV  DESCRIPTION OF PROCEDURE:   The QM-[id number] ([id number])  endoscope was introduced through the mouth and advanced to the second portion of the duodenum without difficulty or limitations.  The mucosal surfaces were surveyed very carefully during advancement of the scope and upon withdrawal.  Retroflexion view of the proximal stomach and esophagogastric junction was performed.      FINDINGS:Normal esophagus. Stomach empty. Multiple vitamin. Gastric polyps in the fundus and body. No ulcer, infiltrating process or abnormality observed. Patent pylorus. Normal first and second portion of the duodenum.  THERAPEUTIC / DIAGNOSTIC MANEUVERS PERFORMED:  One of the gastric polyp was biopsied.   COMPLICATIONS:  None  IMPRESSION:   gastric polyps-status post biopsy. Otherwise, normal EGD  RECOMMENDATIONS:   Followup on pathology. Proceed with abdominal and pelvic CT. See colonoscopy report.    _______________________________ R. Garfield Cornea, MD FACP [facility] eSigned:  R. Garfield Cornea, MD FACP [facility] 10/01/2013 1:25 PM     CC:

## 2013-10-01 NOTE — Telephone Encounter (Signed)
CT Is scheduled for Tuesday July 28th at 8:00 am and Michaela Peterson is aware

## 2013-10-01 NOTE — Telephone Encounter (Signed)
Per RMR the patient needs a ct scan of the abd/pelvis with contrast to evaluate for abd pain  Routing to Darius Bump

## 2013-10-01 NOTE — Discharge Instructions (Addendum)
Colonoscopy Discharge Instructions  Read the instructions outlined below and refer to this sheet in the next few weeks. These discharge instructions provide you with general information on caring for yourself after you leave the hospital. Your doctor may also give you specific instructions. While your treatment has been planned according to the most current medical practices available, unavoidable complications occasionally occur. If you have any problems or questions after discharge, call Dr. [name] at [phone]. ACTIVITY  You may resume your regular activity, but move at a slower pace for the next 24 hours.   Take frequent rest periods for the next 24 hours.   Walking will help get rid of the air and reduce the bloated feeling in your belly (abdomen).   No driving for 24 hours (because of the medicine (anesthesia) used during the test).    Do not sign any important legal documents or operate any machinery for 24 hours (because of the anesthesia used during the test).  NUTRITION  Drink plenty of fluids.   You may resume your normal diet as instructed by your doctor.   Begin with a light meal and progress to your normal diet. Heavy or fried foods are harder to digest and may make you feel sick to your stomach (nauseated).   Avoid alcoholic beverages for 24 hours or as instructed.  MEDICATIONS  You may resume your normal medications unless your doctor tells you otherwise.  WHAT YOU CAN EXPECT TODAY  Some feelings of bloating in the abdomen.   Passage of more gas than usual.   Spotting of blood in your stool or on the toilet paper.  IF YOU HAD POLYPS REMOVED DURING THE COLONOSCOPY:  No aspirin products for 7 days or as instructed.   No alcohol for 7 days or as instructed.   Eat a soft diet for the next 24 hours.  FINDING OUT THE RESULTS OF YOUR TEST Not all test results are available during your visit. If your test results are not back during the visit, make an appointment  with your caregiver to find out the results. Do not assume everything is normal if you have not heard from your caregiver or the medical facility. It is important for you to follow up on all of your test results.  SEEK IMMEDIATE MEDICAL ATTENTION IF:  You have more than a spotting of blood in your stool.   Your belly is swollen (abdominal distention).   You are nauseated or vomiting.   You have a temperature over 101.   You have abdominal pain or discomfort that is severe or gets worse throughout the day.   EGD Discharge instructions Please read the instructions outlined below and refer to this sheet in the next few weeks. These discharge instructions provide you with general information on caring for yourself after you leave the hospital. Your doctor may also give you specific instructions. While your treatment has been planned according to the most current medical practices available, unavoidable complications occasionally occur. If you have any problems or questions after discharge, please call your doctor. ACTIVITY  You may resume your regular activity but move at a slower pace for the next 24 hours.   Take frequent rest periods for the next 24 hours.   Walking will help expel (get rid of) the air and reduce the bloated feeling in your abdomen.   No driving for 24 hours (because of the anesthesia (medicine) used during the test).   You may shower.   Do not sign any  important legal documents or operate any machinery for 24 hours (because of the anesthesia used during the test).  NUTRITION  Drink plenty of fluids.   You may resume your normal diet.   Begin with a light meal and progress to your normal diet.   Avoid alcoholic beverages for 24 hours or as instructed by your caregiver.  MEDICATIONS  You may resume your normal medications unless your caregiver tells you otherwise.  WHAT YOU CAN EXPECT TODAY  You may experience abdominal discomfort such as a feeling of  fullness or gas pains.  FOLLOW-UP  Your doctor will discuss the results of your test with you.  SEEK IMMEDIATE MEDICAL ATTENTION IF ANY OF THE FOLLOWING OCCUR:  Excessive nausea (feeling sick to your stomach) and/or vomiting.   Severe abdominal pain and distention (swelling).   Trouble swallowing.   Temperature over 101 F (37.8 C).   Rectal bleeding or vomiting of blood.   Resume protonix daily for now  Repeat colonoscopy for surveillance in 5 years.  Contrast CT of the abdomen and pelvis to further evaluate abdominal pain   Further recommendations to follow pending pathology

## 2013-10-01 NOTE — Interval H&P Note (Signed)
History and Physical Interval Note:  10/01/2013 1:07 PM  Michaela Peterson  has presented today for surgery, with the diagnosis of DYSPEPSIA   SURVEILLANCE COLONOSCOPY  The various methods of treatment have been discussed with the patient and family. After consideration of risks, benefits and other options for treatment, the patient has consented to  Procedure(s) with comments: COLONOSCOPY (N/A) - 12:45 ESOPHAGOGASTRODUODENOSCOPY (EGD) (N/A) - 12:45 as a surgical intervention .  The patient's history has been reviewed, patient examined, no change in status, stable for surgery.  I have reviewed the patient's chart and labs.  Questions were answered to the patient's satisfaction.      No change. EGD and colonoscopy per plan.The risks, benefits, limitations, imponderables and alternatives regarding both EGD and colonoscopy have been reviewed with the patient. Questions have been answered. All parties agreeable.    Manus Rudd

## 2013-10-01 NOTE — Op Note (Signed)
[facility] [address] [place], [address]   COLONOSCOPY PROCEDURE REPORT  PATIENT: Michaela Peterson, Michaela Peterson  MR#:         [id number] BIRTHDATE: [birth year] , 50  yrs. old GENDER: Female ENDOSCOPIST: R.  Garfield Cornea, MD FACP FACG REFERRED BY:  Tula Nakayama, M.D. PROCEDURE DATE:  10/01/2013 PROCEDURE:     Surveillance colonoscopy  INDICATIONS: History of colonic adenoma  INFORMED CONSENT:  The risks, benefits, alternatives and imponderables including but not limited to bleeding, perforation as well as the possibility of a missed lesion have been reviewed.  The potential for biopsy, lesion removal, etc. have also been discussed.  Questions have been answered.  All parties agreeable. Please see the history and physical in the medical record for more information.  MEDICATIONS: Versed 4 mg IV and Demerol 75 mg IV in divided doses. Zofran 4 mg IV.  DESCRIPTION OF PROCEDURE:  After a digital rectal exam was performed, the EC-[id number] ([id number])  colonoscope was advanced from the anus through the rectum and colon to the area of the cecum, ileocecal valve and appendiceal orifice.  The cecum was deeply intubated.  These structures were well-seen and photographed for the record.  From the level of the cecum and ileocecal valve, the scope was slowly and cautiously withdrawn.  The mucosal surfaces were carefully surveyed utilizing scope tip deflection to facilitate fold flattening as needed.  The scope was pulled down into the rectum where a thorough examination including retroflexion was performed.    FINDINGS:  Adequate preparation. Normal rectum. Normal. Colonic mucosa.  THERAPEUTIC / DIAGNOSTIC MANEUVERS PERFORMED:  none  COMPLICATIONS: none  CECAL WITHDRAWAL TIME:  8 minutes  IMPRESSION:  Normal colonoscopy  RECOMMENDATIONS: Repeat examination 5 years   _______________________________ eSigned:  R. Garfield Cornea, MD FACP [facility] 10/01/2013 1:44  PM   CC:

## 2013-10-01 NOTE — H&P (View-Only) (Signed)
Primary Care Physician:  Tula Nakayama, MD Primary Gastroenterologist:  Dr. [name]   Chief Complaint  Patient presents with  . Abdominal Pain  . Nausea    HPI:   Michaela Peterson presents today with a history of gastroparesis, IDA, mildly elevated transaminases in the setting of NAFLD, chronic abdominal pain. Last EGD in 2010 without acute findings. Last colonoscopy in 2010 with tubular adenoma. Due for surveillance now.  Notes intermittent epigastric pain for a few months. ACute on chronic. Eating stuff that makes her symptoms worse such as tomatoes. Associated nausea, no vomiting. When pain occurs, doesn't want to eat. Associated belching. Drinks a pepsi, starts burping, relieves discomfort. Went off Protonix for awhile and didn't have any trouble, now back on it for several months. Still with persistent pain despite PPI. Taking every day. Every blue moon will take Ibuprofen. No aspirin powders. No dysphagia. No melena. No hematochezia. No constipation, diarrhea.   Past Medical History  Diagnosis Date  . Diabetes mellitus, type 2     A1c of 6.9 in 05/2010  . Sleep apnea 2009  . MS (multiple sclerosis)   . GERD (gastroesophageal reflux disease)     GastroparesisS.;small hiatal hernia  . Hypertension   . Chronic low back pain   . Nonalcoholic fatty liver disease   . Obesity   . Anemia, iron deficiency   . Adnexal mass     Left; followed by Dr. [name]  . Gastroparesis     Past Surgical History  Procedure Laterality Date  . Dilation and curettage of uterus      4  . Laparoscopy abdomen diagnostic      lysis of adhesions  . Tendon lengthening      Left wrist  . Cholecystectomy    . Knee arthroscopy w/ debridement    . Rotator cuff repair    . Bladder suspension    . Cesarean section      x2   . Umbilical hernia repair    . Colonoscopy  2010    Dr. [name]: tubular adenoma, few scattered diverticula  . Esophagogastroduodenoscopy  2010    Dr. [name]: normal  esophagus, small hiatal hernia, questionable pale duodenal mucosa but negative for celiac sprue.     Current Outpatient Prescriptions  Medication Sig Dispense Refill  . cycloSPORINE (RESTASIS) 0.05 % ophthalmic emulsion 1 drop as directed.        . Dapagliflozin Propanediol 10 MG TABS Take 1 tablet by mouth daily with breakfast.  30 tablet  4  . diclofenac sodium (VOLTAREN) 1 % GEL Apply 2 g topically as needed.      . docusate sodium (COLACE) 50 MG capsule Take by mouth daily as needed.        Marland Kitchen glipiZIDE (GLUCOTROL) 10 MG tablet Take 1 tablet (10 mg total) by mouth 2 (two) times daily before a meal.  180 tablet  0  . ibuprofen (ADVIL,MOTRIN) 800 MG tablet Take 1 tablet (800 mg total) by mouth every 8 (eight) hours as needed.  30 tablet  0  . loratadine (CLARITIN) 10 MG tablet TAKE 1 TABLET BY MOUTH EVERY DAY  90 tablet  1  . metFORMIN (GLUCOPHAGE) 1000 MG tablet Take 1 tablet (1,000 mg total) by mouth 2 (two) times daily with a meal.  60 tablet  5  . mupirocin cream (BACTROBAN) 2 % Apply to affected area 3 times daily  30 g  0  . NIFEdipine (ADALAT CC) 90 MG 24  hr tablet Take 1 tablet (90 mg total) by mouth daily.  90 tablet  3  . Olopatadine HCl (PATANASE) 0.6 % SOLN Place into the nose.      . ondansetron (ZOFRAN) 4 MG tablet Take 1 tablet (4 mg total) by mouth every 8 (eight) hours as needed for nausea or vomiting.  20 tablet  0  . pantoprazole (PROTONIX) 40 MG tablet TAKE 1 TABLET EVERY DAY  30 tablet  3  . triamterene-hydrochlorothiazide (MAXZIDE-25) 37.5-25 MG per tablet TAKE 1 TABLET BY MOUTH EVERY DAY  30 tablet  2  . dexlansoprazole (DEXILANT) 60 MG capsule Take 1 capsule (60 mg total) by mouth daily.  30 capsule  3  . glucose blood (RELION CONFIRM/MICRO TEST) test strip Use one strip to test blood sugar twice daily dx 250.00  100 each  5  . loratadine (CLARITIN) 10 MG tablet TAKE 1 TABLET BY MOUTH EVERY DAY  90 tablet  0  . RELION CONFIRM/MICRO TEST test strip USE ONE STRIP TO TEST  BLOOD SUGAR TWICE DAILY  60 each  5  . RELION LANCETS THIN 26G MISC USE ONE LANCET TO CHECK GLUCOSE TWICE DAILY  100 each  3   No current facility-administered medications for this visit.    Allergies as of 09/17/2013 - Review Complete 09/17/2013  Allergen Reaction Noted  . Ace inhibitors Cough 03/26/2012  . Aspirin    . Oxycodone-acetaminophen  02/21/2007  . Penicillins    . Statins  10/11/2012    Family History  Problem Relation Age of Onset  . Heart failure Mother   . Hypertension Mother   . Arthritis Mother   . Stroke Father   . Hypertension Sister   . Diabetes Sister   . Heart disease Sister   . Hypertension Son   . Colon cancer Neg Hx     History   Social History  . Marital Status: Married    Spouse Name: [name]     Number of Children: 4  . Years of Education: 13   Occupational History  . unemployed     Social History Main Topics  . Smoking status: Never Smoker   . Smokeless tobacco: Never Used     Comment: patient lives with a smoker   . Alcohol Use: No  . Drug Use: No  . Sexual Activity: Yes   Other Topics Concern  . Not on file   Social History Narrative   Patient lives at home with husband [name].    Patient has 4 children.    Patient has a some college.    Patient is right handed.    Patient is currently not working.     Review of Systems: Gen: Denies any fever, chills, fatigue, weight loss, lack of appetite.  CV: Denies chest pain, heart palpitations, peripheral edema, syncope.  Resp: Denies shortness of breath at rest or with exertion. Denies wheezing or cough.  GI: see HPI GU : Denies urinary burning, urinary frequency, urinary hesitancy MS: +joint pain Derm: Denies rash, itching, dry skin Psych: Denies depression, anxiety, memory loss, and confusion Heme: Denies bruising, bleeding, and enlarged lymph nodes.  Physical Exam: BP 129/79  Pulse 69  Temp(Src) 97.4 F (36.3 C) (Oral)  Ht 5\' 1"  (1.549 m)  Wt 189 lb 6.4 oz (85.911 kg)  BMI  35.81 kg/m2  LMP 08/18/2012 General:   Alert and oriented. Pleasant and cooperative. Well-nourished and well-developed.  Head:  Normocephalic and atraumatic. Eyes:  Without icterus, sclera clear and  conjunctiva pink.  Ears:  Normal auditory acuity. Nose:  No deformity, discharge,  or lesions. Mouth:  No deformity or lesions, oral mucosa pink.  Lungs:  Clear to auscultation bilaterally. No wheezes, rales, or rhonchi. No distress.  Heart:  S1, S2 present without murmurs appreciated.  Abdomen:  +BS, soft, very mild tenderness epigastric/LUQ and non-distended. No HSM noted. No guarding or rebound. No masses appreciated.  Rectal:  Deferred  Msk:  Symmetrical without gross deformities. Normal posture. Extremities:  Without clubbing or edema. Neurologic:  Alert and  oriented x4;  grossly normal neurologically. Skin:  Intact without significant lesions or rashes. Psych:  Alert and cooperative. Normal mood and affect.  Lab Results  Component Value Date   ALT 54* 08/11/2013   AST 35 08/11/2013   ALKPHOS 87 08/11/2013   BILITOT 0.5 08/11/2013    Lab Results  Component Value Date   LIPASE 61* 08/12/2013   Lab Results  Component Value Date   AMYLASE 57 08/12/2013   Lab Results  Component Value Date   CREATININE 0.86 08/11/2013   BUN 19 08/11/2013   NA 139 08/11/2013   K 4.4 08/11/2013   CL 98 08/11/2013   CO2 24 08/11/2013   June 2015: H.pylori serology negative  Lab Results  Component Value Date   WBC 8.1 10/06/2012   HGB 11.5* 10/06/2012   HCT 34.9* 10/06/2012   MCV 77.7* 10/06/2012   PLT 484* 10/06/2012

## 2013-10-02 ENCOUNTER — Encounter (HOSPITAL_COMMUNITY): Payer: Self-pay | Admitting: Internal Medicine

## 2013-10-05 ENCOUNTER — Encounter: Payer: Self-pay | Admitting: Internal Medicine

## 2013-10-06 ENCOUNTER — Encounter (HOSPITAL_COMMUNITY): Payer: Self-pay

## 2013-10-06 ENCOUNTER — Ambulatory Visit (HOSPITAL_COMMUNITY)
Admission: RE | Admit: 2013-10-06 | Discharge: 2013-10-06 | Disposition: A | Discharge status: Home / Self Care | Payer: BC Managed Care – PPO | Source: Ambulatory Visit | Attending: Internal Medicine | Admitting: Internal Medicine

## 2013-10-06 DIAGNOSIS — R109 Unspecified abdominal pain: Secondary | ICD-10-CM | POA: Insufficient documentation

## 2013-10-06 LAB — POCT I-STAT CREATININE: Creatinine, Ser: 0.9 mg/dL (ref 0.50–1.10)

## 2013-10-06 MED ORDER — SODIUM CHLORIDE 0.9 % IJ SOLN
INTRAMUSCULAR | Status: AC
  Filled 2013-10-06: qty 500

## 2013-10-06 MED ORDER — IOHEXOL 300 MG/ML  SOLN
100.0000 mL | Freq: Once | INTRAMUSCULAR | Status: AC | PRN
  Administered 2013-10-06: 100 mL via INTRAVENOUS

## 2013-10-09 NOTE — Progress Notes (Signed)
Quick Note:  No mention of patient having symptoms suggestive of lung infection.  I would suggest patient see PCP next week for these lung findings. If she has cough, fever, SOB she should be seen ASAP by PCP or ER.   RMR can address non-chest findings upon his return. ______

## 2013-10-12 ENCOUNTER — Telehealth: Payer: Self-pay | Admitting: Family Medicine

## 2013-10-12 ENCOUNTER — Other Ambulatory Visit: Payer: Self-pay | Admitting: Family Medicine

## 2013-10-12 DIAGNOSIS — R9389 Abnormal findings on diagnostic imaging of other specified body structures: Secondary | ICD-10-CM

## 2013-10-12 NOTE — Telephone Encounter (Signed)
Study is already ordered , I took care of this

## 2013-10-12 NOTE — Telephone Encounter (Signed)
Pls contact pt, let her know I have a msg from GI that her abdominal scan shows abnormality in lower chest which may reflect infection. Ask and document igfany productive cough, fever or chills, if no suiymptoms of infection, she will need to be seen by a lung specialist , but will need as dedicated chest scan. Pls give me feedback This is important

## 2013-10-12 NOTE — Telephone Encounter (Signed)
Patient not having any signs of infection. Still having the chronic nausea and every now and then will have a pain between her breast bone but no cough or any signs of infection. What is the diagnosis for referral and I will enter

## 2013-10-12 NOTE — Telephone Encounter (Signed)
Abnormal chest scna on imaging study is dx

## 2013-10-13 ENCOUNTER — Other Ambulatory Visit: Payer: Self-pay | Admitting: Family Medicine

## 2013-10-13 ENCOUNTER — Other Ambulatory Visit: Payer: Self-pay | Admitting: Gastroenterology

## 2013-10-13 DIAGNOSIS — D509 Iron deficiency anemia, unspecified: Secondary | ICD-10-CM

## 2013-10-16 ENCOUNTER — Ambulatory Visit (HOSPITAL_COMMUNITY)
Admission: RE | Admit: 2013-10-16 | Discharge: 2013-10-16 | Disposition: A | Discharge status: Home / Self Care | Payer: BC Managed Care – PPO | Source: Ambulatory Visit | Attending: Family Medicine | Admitting: Family Medicine

## 2013-10-16 DIAGNOSIS — J984 Other disorders of lung: Secondary | ICD-10-CM | POA: Insufficient documentation

## 2013-10-16 DIAGNOSIS — R9389 Abnormal findings on diagnostic imaging of other specified body structures: Secondary | ICD-10-CM

## 2013-10-16 MED ORDER — IOHEXOL 300 MG/ML  SOLN
80.0000 mL | Freq: Once | INTRAMUSCULAR | Status: AC | PRN
  Administered 2013-10-16: 80 mL via INTRAVENOUS

## 2013-10-19 ENCOUNTER — Ambulatory Visit (INDEPENDENT_AMBULATORY_CARE_PROVIDER_SITE_OTHER): Payer: BC Managed Care – PPO | Admitting: Family Medicine

## 2013-10-19 ENCOUNTER — Encounter: Payer: Self-pay | Admitting: Family Medicine

## 2013-10-19 VITALS — BP 122/78 | HR 82 | Resp 16 | Ht 61.5 in | Wt 191.1 lb

## 2013-10-19 DIAGNOSIS — G909 Disorder of the autonomic nervous system, unspecified: Secondary | ICD-10-CM

## 2013-10-19 DIAGNOSIS — Z23 Encounter for immunization: Secondary | ICD-10-CM

## 2013-10-19 DIAGNOSIS — R4689 Other symptoms and signs involving appearance and behavior: Secondary | ICD-10-CM

## 2013-10-19 DIAGNOSIS — IMO0002 Reserved for concepts with insufficient information to code with codable children: Secondary | ICD-10-CM

## 2013-10-19 DIAGNOSIS — E1149 Type 2 diabetes mellitus with other diabetic neurological complication: Secondary | ICD-10-CM

## 2013-10-19 DIAGNOSIS — I10 Essential (primary) hypertension: Secondary | ICD-10-CM

## 2013-10-19 DIAGNOSIS — E1143 Type 2 diabetes mellitus with diabetic autonomic (poly)neuropathy: Secondary | ICD-10-CM

## 2013-10-19 DIAGNOSIS — E1165 Type 2 diabetes mellitus with hyperglycemia: Secondary | ICD-10-CM

## 2013-10-19 DIAGNOSIS — Z79899 Other long term (current) drug therapy: Secondary | ICD-10-CM

## 2013-10-19 DIAGNOSIS — R9389 Abnormal findings on diagnostic imaging of other specified body structures: Secondary | ICD-10-CM

## 2013-10-19 DIAGNOSIS — E119 Type 2 diabetes mellitus without complications: Secondary | ICD-10-CM

## 2013-10-19 DIAGNOSIS — E1169 Type 2 diabetes mellitus with other specified complication: Secondary | ICD-10-CM

## 2013-10-19 DIAGNOSIS — E669 Obesity, unspecified: Secondary | ICD-10-CM

## 2013-10-19 DIAGNOSIS — E1159 Type 2 diabetes mellitus with other circulatory complications: Secondary | ICD-10-CM

## 2013-10-19 MED ORDER — GLIPIZIDE 10 MG PO TABS: ORAL_TABLET | ORAL | Status: DC

## 2013-10-19 MED ORDER — LORATADINE 10 MG PO TABS: ORAL_TABLET | ORAL | Status: DC

## 2013-10-19 NOTE — Assessment & Plan Note (Signed)
Deteriorated Patient educated about the importance of limiting  Carbohydrate intake , the need to commit to daily physical activity for a minimum of 30 minutes , and to commit weight loss. The fact that changes in all these areas will reduce or eliminate all together the development of diabetes is stressed.   \Updated lab needed at/ before next visit.  

## 2013-10-19 NOTE — Assessment & Plan Note (Signed)
Deteriorated. Patient re-educated about  the importance of commitment to a  minimum of 150 minutes of exercise per week. The importance of healthy food choices with portion control discussed. Encouraged to start a food diary, count calories and to consider  joining a support group. Sample diet sheets offered. Goals set by the patient for the next several months.    

## 2013-10-19 NOTE — Assessment & Plan Note (Signed)
Ground glass opacities on film in 10/2013. Pt asymptomatic, refer pulmonary

## 2013-10-19 NOTE — Assessment & Plan Note (Signed)
Vaccine administered.

## 2013-10-19 NOTE — Assessment & Plan Note (Signed)
Controlled, no change in medication DASH diet and commitment to daily physical activity for a minimum of 30 minutes discussed and encouraged, as a part of hypertension management. The importance of attaining a healthy weight is also discussed.  

## 2013-10-19 NOTE — Assessment & Plan Note (Signed)
Still not testing blood sugars regularly, and not following regular carb controlled diet. Voices understanding of the grave consequences she faces if this continues, states she will change, blood sugar has deteriorated

## 2013-10-19 NOTE — Progress Notes (Signed)
Subjective:    Patient ID: Michaela Peterson, female    DOB: 15-Feb-1956, 58 y.o.   MRN: [id number]  HPI The PT is here for follow up and re-evaluation of chronic medical conditions, medication management and review of any available recent lab and radiology data.  Preventive health is updated, specifically  Cancer screening and Immunization.   Has referral in for pulmonary due to abnormal finding on recent chest scan Questions or concerns regarding consultations or procedures which the PT has had in the interim are  addressed. The PT denies any adverse reactions to current medications since the last visit.  There are no new concerns.  There are no specific complaints  Still not testing regularly or following controlled nutrition, blood sugars have escalated and are out of control      Review of Systems See HPI Denies recent fever or chills. Denies sinus pressure, nasal congestion, ear pain or sore throat. Denies chest congestion, productive cough or wheezing. Denies chest pains, palpitations and leg swelling Denies abdominal pain, nausea, vomiting,diarrhea or constipation.   Denies dysuria, frequency, hesitancy or incontinence. Denies joint pain, swelling and limitation in mobility. Denies headaches, seizures, numbness, or tingling. Denies depression, anxiety or insomnia. Denies skin break down or rash.        Objective:   Physical Exam BP 122/78  Pulse 82  Resp 16  Ht 5' 1.5" (1.562 m)  Wt 191 lb 1.9 oz (86.691 kg)  BMI 35.53 kg/m2  SpO2 99%  LMP 08/18/2012 Patient alert and oriented and in no cardiopulmonary distress.  HEENT: No facial asymmetry, EOMI,   oropharynx pink and moist.  Neck supple no JVD, no mass.  Chest: Clear to auscultation bilaterally.  CVS: S1, S2 no murmurs, no S3.Regular rate.  ABD: Soft non tender.   Ext: No edema  MS: Adequate ROM spine, shoulders, hips and knees.  Skin: Intact, no ulcerations or rash noted.  Psych: Good eye contact,  normal affect. Memory intact not anxious or depressed appearing.  CNS: CN 2-12 intact, power,  normal throughout.no focal deficits noted.        Assessment & Plan:  Hypertension goal BP (blood pressure) < 140/90 Controlled, no change in medication DASH diet and commitment to daily physical activity for a minimum of 30 minutes discussed and encouraged, as a part of hypertension management. The importance of attaining a healthy weight is also discussed.   Uncontrolled diabetes mellitus with peripheral autonomic neuropathy Deteriorated. Patient educated about the importance of limiting  Carbohydrate intake , the need to commit to daily physical activity for a minimum of 30 minutes , and to commit weight loss. The fact that changes in all these areas will reduce or eliminate all together the development of diabetes is stressed.   Updated lab needed at/ before next visit.   Need for vaccination with 13-polyvalent pneumococcal conjugate vaccine Vaccine administered  Abnormal CT scan, chest Ground glass opacities on film in 10/2013. Pt asymptomatic, refer pulmonary  Non-compliant behavior Still not testing blood sugars regularly, and not following regular carb controlled diet. Voices understanding of the grave consequences she faces if this continues, states she will change, blood sugar has deteriorated  Obesity, diabetes, and hypertension syndrome Deteriorated. Patient re-educated about  the importance of commitment to a  minimum of 150 minutes of exercise per week. The importance of healthy food choices with portion control discussed. Encouraged to start a food diary, count calories and to consider  joining a support group. Sample diet sheets  offered. Goals set by the patient for the next several months.

## 2013-10-19 NOTE — Patient Instructions (Addendum)
F/u 2nd week in October, call if you need me before  You are referred to lung specialist re abnormal chest scan as we discussed  Microalb today  Prevnar today, call for flu vaccine  Commit to eating on a regular schedule and testing blood sugars every morning before breakfast. Each meal shoul;d have 45 to 60 gm carb, we will re educate here today further  Goal for fasting blood sugar ranges from 80 to 130 and 2 hours after any meal or at bedtime should be between 130 to 170.  Call every week that your fasting  blood sugars are high  Plan to attend class in September  Commit to daily walking/exercise in the home    Fasting lipid, cmp and EGFR, hBA1C 2nd week in October

## 2013-10-20 LAB — MICROALBUMIN / CREATININE URINE RATIO
Creatinine, Urine: 37.7 mg/dL
Microalb Creat Ratio: 13.3 mg/g (ref 0.0–30.0)
Microalb, Ur: 0.5 mg/dL (ref 0.00–1.89)

## 2013-10-22 ENCOUNTER — Other Ambulatory Visit: Payer: Self-pay | Admitting: Internal Medicine

## 2013-10-22 ENCOUNTER — Encounter: Payer: Self-pay | Admitting: Internal Medicine

## 2013-10-22 DIAGNOSIS — K760 Fatty (change of) liver, not elsewhere classified: Secondary | ICD-10-CM

## 2013-10-30 ENCOUNTER — Telehealth: Payer: Self-pay

## 2013-10-30 NOTE — Telephone Encounter (Signed)
FOLLOW UP CALL TO CHECK ON DIABETES States her sugars have been running a lot better. Some fasting readings are 114,106,129,171. The one high reading came from where she ate late. Encouraged her to continue testing and logging and to bring them to her next visit. Congratulated patient on getting her sugars down.

## 2013-11-10 ENCOUNTER — Ambulatory Visit (INDEPENDENT_AMBULATORY_CARE_PROVIDER_SITE_OTHER): Payer: BC Managed Care – PPO | Admitting: Emergency Medicine

## 2013-11-10 ENCOUNTER — Encounter: Payer: Self-pay | Admitting: Emergency Medicine

## 2013-11-10 VITALS — BP 110/70 | HR 89 | Ht 61.0 in | Wt 189.0 lb

## 2013-11-10 DIAGNOSIS — R9389 Abnormal findings on diagnostic imaging of other specified body structures: Secondary | ICD-10-CM

## 2013-11-10 DIAGNOSIS — R918 Other nonspecific abnormal finding of lung field: Secondary | ICD-10-CM

## 2013-11-10 NOTE — Assessment & Plan Note (Signed)
Etiology of groundglass infiltrates is unclear. This could be straightforward as acute inflammation due to a viral process or bronchitis. Must consider other etiologies including autoimmune disease, infectious or noninfectious pneumonitis, eosinophilic pneumonia, etc. She is asymptomatic at this point and I believe it would be reasonable to repeat her CT scan in 6 months. Do this sooner if she develops new symptoms. In the meantime we will perform pulmonary function testing to assess her for obstructive lung disease which could correspond with an etiology for her interstitial changes.

## 2013-11-10 NOTE — Progress Notes (Signed)
Subjective:    Patient ID: Michaela Peterson, female    DOB: Jan 04, 1956, 58 y.o.   MRN: [id number]  HPI 58 yo never smoker with hx MS, DM, HTN, OSA. She had a recent CT abdomen that showed some scattered GGI without clear etiology, no clear scarring. Led to a CT chest that confirmed the same. She is referred to eval the abnormal CT scan. She has OSA but cannot wear CPAP.   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 She is sensitive to perfumes and cleansers. She gets nasal congestion and obstruction at these times. Has required BD's before. She was treated for "walking pna" in the past as well.  No mold or animal exposure except dog. No family hx lung disease or auto-immune disease.    Review of Systems  Constitutional: Negative for fever and unexpected weight change.  HENT: Positive for sinus pressure. Negative for congestion, dental problem, ear pain, nosebleeds, postnasal drip, rhinorrhea, sneezing, sore throat and trouble swallowing.   Eyes: Negative for redness and itching.  Respiratory: Positive for shortness of breath. Negative for cough, chest tightness and wheezing.   Cardiovascular: Positive for chest pain, palpitations and leg swelling.  Gastrointestinal: Positive for nausea. Negative for vomiting.  Genitourinary: Negative for dysuria.  Musculoskeletal: Negative for joint swelling.  Skin: Negative for rash.  Neurological: Negative for headaches.  Hematological: Does not bruise/bleed easily.  Psychiatric/Behavioral: Negative for dysphoric mood. The patient is not nervous/anxious.     Past Medical History  Diagnosis Date  . Diabetes mellitus, type 2     A1c of 6.9 in 05/2010  . Sleep apnea 2009  . MS (multiple sclerosis)   . GERD (gastroesophageal reflux disease)     GastroparesisS.;small hiatal hernia  . Hypertension   . Chronic low back pain   . Nonalcoholic fatty liver disease   . Obesity   . Anemia, iron deficiency   . Adnexal mass     Left;  followed by Dr. [name]  . Gastroparesis      Family History  Problem Relation Age of Onset  . Heart failure Mother   . Hypertension Mother   . Arthritis Mother   . Stroke Father   . Hypertension Sister   . Diabetes Sister   . Heart disease Sister   . Hypertension Son   . Colon cancer Neg Hx      History   Social History  . Marital Status: Married    Spouse Name: [name]     Number of Children: 4  . Years of Education: 13   Occupational History  . unemployed     Social History Main Topics  . Smoking status: Never Smoker   . Smokeless tobacco: Never Used     Comment: patient lives with a smoker   . Alcohol Use: No  . Drug Use: No  . Sexual Activity: Yes   Other Topics Concern  . Not on file   Social History Narrative   Patient lives at home with husband [name].    Patient has 4 children.    Patient has a some college.    Patient is right handed.    Patient is currently not working.      Allergies  Allergen Reactions  . Ace Inhibitors Cough  . Aspirin     REACTION: unknown reaction  . Oxycodone-Acetaminophen   . Penicillins   . Statins     NAFLD     Outpatient Prescriptions Prior  to Visit  Medication Sig Dispense Refill  . cycloSPORINE (RESTASIS) 0.05 % ophthalmic emulsion Place 1 drop into both eyes 2 (two) times daily.       . diclofenac sodium (VOLTAREN) 1 % GEL Apply 2 g topically as needed (inflammation/pain).       Marland Kitchen docusate sodium (COLACE) 50 MG capsule Take 50 mg by mouth daily as needed for mild constipation.       Marland Kitchen FARXIGA 10 MG TABS TAKE 1 TABLET BY MOUTH EVERY DAY AT BREAKFAST  30 tablet  4  . glipiZIDE (GLUCOTROL) 10 MG tablet TAKE 1 TABLET (10 MG TOTAL) BY MOUTH 2 (TWO) TIMES DAILY BEFORE A MEAL.  180 tablet  1  . ibuprofen (ADVIL,MOTRIN) 800 MG tablet Take 1 tablet (800 mg total) by mouth every 8 (eight) hours as needed.  30 tablet  0  . loratadine (CLARITIN) 10 MG tablet TAKE 1 TABLET BY MOUTH EVERY DAY  90 tablet  1  . metFORMIN  (GLUCOPHAGE) 1000 MG tablet Take 1 tablet (1,000 mg total) by mouth 2 (two) times daily with a meal.  60 tablet  5  . mupirocin cream (BACTROBAN) 2 % Apply to affected area 3 times daily  30 g  0  . NIFEdipine (ADALAT CC) 90 MG 24 hr tablet Take 1 tablet (90 mg total) by mouth daily.  90 tablet  3  . Olopatadine HCl (PATANASE) 0.6 % SOLN As directed  30.5 g  2  . pantoprazole (PROTONIX) 40 MG tablet Take 40 mg by mouth daily.      . phenylephrine (SUDAFED PE) 10 MG TABS tablet Take 10 mg by mouth every 4 (four) hours as needed (congestion).      . Propylene Glycol (SYSTANE BALANCE OP) Apply 1 drop to eye daily.      Marland Kitchen triamterene-hydrochlorothiazide (MAXZIDE-25) 37.5-25 MG per tablet TAKE 1 TABLET BY MOUTH EVERY DAY  30 tablet  2  . dexlansoprazole (DEXILANT) 60 MG capsule Take 1 capsule (60 mg total) by mouth daily.  30 capsule  3  . ondansetron (ZOFRAN) 4 MG tablet Take 1 tablet (4 mg total) by mouth every 8 (eight) hours as needed for nausea or vomiting.  20 tablet  0   No facility-administered medications prior to visit.       Objective:   Physical Exam Filed Vitals:   11/10/13 1059  BP: 110/70  Pulse: 89  Height: 5\' 1"  (1.549 m)  Weight: 189 lb (85.73 kg)  SpO2: 96%   Gen: Pleasant, well-nourished, in no distress,  normal affect  ENT: No lesions,  mouth clear,  oropharynx clear, no postnasal drip  Neck: No JVD, no TMG, no carotid bruits  Lungs: No use of accessory muscles, no dullness to percussion, clear without rales or rhonchi  Cardiovascular: RRR, heart sounds normal, no murmur or gallops, no peripheral edema  Abdomen: soft and NT, no HSM,  BS normal  Musculoskeletal: No deformities, no cyanosis or clubbing  Neuro: alert, non focal  Skin: Warm, no lesions or rashes      10/16/13 --  COMPARISON: CT abdomen and pelvis 10/06/2013. Chest radiographs  11/15/2011.  FINDINGS:  No enlarged axillary or hilar lymph nodes are seen. Small  mediastinal lymph nodes are  noted. Heart is normal in size. There is  no pleural or pericardial effusion.  Patchy areas of predominantly peripheral ground-glass opacity are  again seen. Opacity in the medial right lower lobe and posterior  left lower lobe do not appear significantly  changed. Additional  areas not previously imaged are present more superiorly in the right  lower lobe. Minimal subpleural ground-glass opacity in the right  middle lobe and lingula do not appear significantly changed. No lung  nodules are seen.  Diffusely low attenuation of the liver is compatible with moderate  steatosis. Cholecystectomy clips are partially visualized.  IMPRESSION:  Bilateral, predominantly peripheral ground-glass lung opacities,  with opacities in the lung bases unchanged from recent abdominal CT.  While these may reflect sequelae of infection, early interstitial  lung disease is also a consideration. Follow-up high-resolution  chest CT is recommended in 1 year to assess for temporal change      Assessment & Plan:  SLEEP APNEA Untreated   Abnormal CT scan, chest Etiology of groundglass infiltrates is unclear. This could be straightforward as acute inflammation due to a viral process or bronchitis. Must consider other etiologies including autoimmune disease, infectious or noninfectious pneumonitis, eosinophilic pneumonia, etc. She is asymptomatic at this point and I believe it would be reasonable to repeat her CT scan in 6 months. Do this sooner if she develops new symptoms. In the meantime we will perform pulmonary function testing to assess her for obstructive lung disease which could correspond with an etiology for her interstitial changes.

## 2013-11-10 NOTE — Patient Instructions (Signed)
We will repeat your CT scan of the chest in 6 months We will perform full pulmonary function testing at your next visit Follow with Dr Lamonte Sakai in 6 months or sooner if you have any problems

## 2013-11-10 NOTE — Assessment & Plan Note (Signed)
Untreated  

## 2013-11-26 ENCOUNTER — Other Ambulatory Visit: Payer: Self-pay | Admitting: Family Medicine

## 2013-12-23 ENCOUNTER — Encounter: Payer: Self-pay | Admitting: Family Medicine

## 2013-12-23 ENCOUNTER — Other Ambulatory Visit: Payer: Self-pay

## 2013-12-23 ENCOUNTER — Ambulatory Visit (INDEPENDENT_AMBULATORY_CARE_PROVIDER_SITE_OTHER): Payer: BC Managed Care – PPO | Admitting: Family Medicine

## 2013-12-23 VITALS — BP 126/80 | HR 91 | Resp 18 | Ht 61.0 in | Wt 190.0 lb

## 2013-12-23 DIAGNOSIS — IMO0002 Reserved for concepts with insufficient information to code with codable children: Secondary | ICD-10-CM

## 2013-12-23 DIAGNOSIS — G990 Autonomic neuropathy in diseases classified elsewhere: Secondary | ICD-10-CM

## 2013-12-23 DIAGNOSIS — E119 Type 2 diabetes mellitus without complications: Secondary | ICD-10-CM

## 2013-12-23 DIAGNOSIS — E1143 Type 2 diabetes mellitus with diabetic autonomic (poly)neuropathy: Secondary | ICD-10-CM

## 2013-12-23 DIAGNOSIS — E1165 Type 2 diabetes mellitus with hyperglycemia: Principal | ICD-10-CM

## 2013-12-23 DIAGNOSIS — J3089 Other allergic rhinitis: Secondary | ICD-10-CM

## 2013-12-23 DIAGNOSIS — E1169 Type 2 diabetes mellitus with other specified complication: Secondary | ICD-10-CM

## 2013-12-23 DIAGNOSIS — I10 Essential (primary) hypertension: Secondary | ICD-10-CM

## 2013-12-23 DIAGNOSIS — K219 Gastro-esophageal reflux disease without esophagitis: Secondary | ICD-10-CM

## 2013-12-23 DIAGNOSIS — E1159 Type 2 diabetes mellitus with other circulatory complications: Secondary | ICD-10-CM

## 2013-12-23 DIAGNOSIS — Z23 Encounter for immunization: Secondary | ICD-10-CM

## 2013-12-23 DIAGNOSIS — E669 Obesity, unspecified: Secondary | ICD-10-CM

## 2013-12-23 DIAGNOSIS — K760 Fatty (change of) liver, not elsewhere classified: Secondary | ICD-10-CM

## 2013-12-23 LAB — COMPLETE METABOLIC PANEL WITH GFR
ALT: 42 U/L — ABNORMAL HIGH (ref 0–35)
AST: 35 U/L (ref 0–37)
Albumin: 4.4 g/dL (ref 3.5–5.2)
Alkaline Phosphatase: 94 U/L (ref 39–117)
BUN: 13 mg/dL (ref 6–23)
CO2: 26 mEq/L (ref 19–32)
Calcium: 10 mg/dL (ref 8.4–10.5)
Chloride: 104 mEq/L (ref 96–112)
Creat: 0.77 mg/dL (ref 0.50–1.10)
GFR, Est African American: >89 mL/min
GFR, Est Non African American: 85 mL/min
Glucose, Bld: 106 mg/dL — ABNORMAL HIGH (ref 70–99)
Potassium: 4.5 mEq/L (ref 3.5–5.3)
Sodium: 139 mEq/L (ref 135–145)
Total Bilirubin: 0.4 mg/dL (ref 0.2–1.2)
Total Protein: 7.4 g/dL (ref 6.0–8.3)

## 2013-12-23 LAB — HEMOGLOBIN A1C
Hgb A1c MFr Bld: 7.8 % — ABNORMAL HIGH (ref <5.7)
Mean Plasma Glucose: 177 mg/dL — ABNORMAL HIGH (ref <117)

## 2013-12-23 LAB — LIPID PANEL
Cholesterol: 162 mg/dL (ref 0–200)
HDL: 66 mg/dL (ref >39)
LDL Cholesterol: 85 mg/dL (ref 0–99)
Total CHOL/HDL Ratio: 2.5 Ratio
Triglycerides: 55 mg/dL (ref <150)
VLDL: 11 mg/dL (ref 0–40)

## 2013-12-23 NOTE — Assessment & Plan Note (Signed)
Vaccine administered at visit.  

## 2013-12-23 NOTE — Progress Notes (Signed)
   Subjective:    Patient ID: Michaela Peterson, female    DOB: 1955-06-22, 58 y.o.   MRN: [id number]  HPI The PT is here for follow up and re-evaluation of chronic medical conditions, medication management and review of any available recent lab and radiology data.  Preventive health is updated, specifically  Cancer screening and Immunization.   Has seen pulmonary for abnormal chest scan and has f/u in Februaury The PT denies any adverse reactions to current medications since the last visit.  There are no new concerns.  There are no specific complaints       Review of Systems See HPI Denies recent fever or chills. Denies sinus pressure, nasal congestion, ear pain or sore throat. Denies chest congestion, productive cough or wheezing. Denies chest pains, palpitations and leg swelling Denies abdominal pain, nausea, vomiting,diarrhea or constipation.   Denies dysuria, frequency, hesitancy or incontinence. Denies joint pain, swelling and limitation in mobility. Denies headaches, seizures, numbness, or tingling. Denies depression, anxiety or insomnia. Denies skin break down or rash.        Objective:   Physical Exam  BP 126/80  Pulse 91  Resp 18  Ht 5\' 1"  (1.549 m)  Wt 190 lb (86.183 kg)  BMI 35.92 kg/m2  SpO2 99%  LMP 08/18/2012 Patient alert and oriented and in no cardiopulmonary distress.  HEENT: No facial asymmetry, EOMI,   oropharynx pink and moist.  Neck supple no JVD, no mass.  Chest: Clear to auscultation bilaterally.  CVS: S1, S2 no murmurs, no S3.Regular rate.  ABD: Soft non tender.   Ext: No edema  MS: Adequate ROM spine, shoulders, hips and knees.  Skin: Intact, no ulcerations or rash noted.  Psych: Good eye contact, normal affect. Memory intact not anxious or depressed appearing.  CNS: CN 2-12 intact, power,  normal throughout.no focal deficits noted.       Assessment & Plan:  Hypertension goal BP (blood pressure) < 140/90 Controlled, no change  in medication DASH diet and commitment to daily physical activity for a minimum of 30 minutes discussed and encouraged, as a part of hypertension management. The importance of attaining a healthy weight is also discussed.   Uncontrolled diabetes mellitus with peripheral autonomic neuropathy Marked improvement, pt applauded on this New goal set for 6.9 Pt needs to remember to take meds regularly and to start regular exercise. No med changes Patient advised to reduce carb and sweets, commit to regular physical activity, take meds as prescribed, test blood as directed, and attempt to lose weight, to improve blood sugar control. Updated lab needed at/ before next visit.   Allergic rhinitis Controlled, no change in medication   Need for Tdap vaccination Vaccine administered at visit.   Need for prophylactic vaccination and inoculation against influenza Vaccine administered at visit.   GERD Controlled, no change in medication   Obesity, diabetes, and hypertension syndrome Unchanged Patient re-educated about  the importance of commitment to a  minimum of 150 minutes of exercise per week. The importance of healthy food choices with portion control discussed. Encouraged to start a food diary, count calories and to consider  joining a support group. Sample diet sheets offered. Goals set by the patient for the next several months.

## 2013-12-23 NOTE — Assessment & Plan Note (Signed)
Unchanged. Patient re-educated about  the importance of commitment to a  minimum of 150 minutes of exercise per week. The importance of healthy food choices with portion control discussed. Encouraged to start a food diary, count calories and to consider  joining a support group. Sample diet sheets offered. Goals set by the patient for the next several months.    

## 2013-12-23 NOTE — Assessment & Plan Note (Signed)
Controlled, no change in medication  

## 2013-12-23 NOTE — Assessment & Plan Note (Signed)
Controlled, no change in medication DASH diet and commitment to daily physical activity for a minimum of 30 minutes discussed and encouraged, as a part of hypertension management. The importance of attaining a healthy weight is also discussed.  

## 2013-12-23 NOTE — Assessment & Plan Note (Signed)
Marked improvement, pt applauded on this New goal set for 6.9 Pt needs to remember to take meds regularly and to start regular exercise. No med changes Patient advised to reduce carb and sweets, commit to regular physical activity, take meds as prescribed, test blood as directed, and attempt to lose weight, to improve blood sugar control. Updated lab needed at/ before next visit.

## 2013-12-23 NOTE — Patient Instructions (Addendum)
F/u in 4.month, call if you need me before  TDaP and Flu vaccine today  Congrats on excellent blood pressure and cholesterol, also marked improvement in blood sugar  PLS use pill box so that you remember to take all meds at same time every day  It is important that you exercise regularly at least 30 minutes 5 times a week. If you develop chest pain, have severe difficulty breathing, or feel very tired, stop exercising immediately and seek medical attention    Any bleeding after now will need to be eval;uated by gyne since you have achieved menopause  Cmp and EgFr , HBa!C non fast in 4 months

## 2013-12-24 ENCOUNTER — Other Ambulatory Visit: Payer: Self-pay

## 2013-12-24 MED ORDER — LORATADINE 10 MG PO TABS: ORAL_TABLET | ORAL | Status: DC

## 2013-12-27 ENCOUNTER — Other Ambulatory Visit: Payer: Self-pay | Admitting: Family Medicine

## 2014-01-15 ENCOUNTER — Telehealth: Payer: Self-pay | Admitting: Internal Medicine

## 2014-01-15 NOTE — Telephone Encounter (Signed)
PATIENT CALLED INQUIRING ABOUT LABS SHE IS SUPPOSED TO HAVE DRAWN.  DR SIMPSON DREW LABS LAST MONTH AND SHE WANTS TO MAKE SURE SHE IS NOT HAVING ANY REPEAT LABS TAKEN.  PLEASE ADVISE [phone]  SAID IF SHE IS NOT THERE SHE WILL HAVE ANSWERING MACHINE ON.

## 2014-01-19 ENCOUNTER — Telehealth: Payer: Self-pay | Admitting: Internal Medicine

## 2014-01-19 ENCOUNTER — Encounter: Payer: Self-pay | Admitting: *Deleted

## 2014-01-19 NOTE — Telephone Encounter (Signed)
Patient had called on 11/6 and hasn't heard back from [place]. She has an OV coming up with [place] soon and wants to know if she needs to get labs done or not. She said that she had some done with Dr Moshe Cipro recently and didn't know if it would be the same thing as we would need. Then she asked if she did need to get labs does she need to be fasting. Please advise

## 2014-01-19 NOTE — Telephone Encounter (Signed)
Pt has OV this Friday with [place]

## 2014-01-19 NOTE — Telephone Encounter (Signed)
Pt had labs done in October and LFT's were included. pts ov with AS is this week. I advised pt that this was in the bloodwork that Dr.Simpson had done and that if AS felt she needed anything else when she comes in she would let her know. I also informed her that we have her on the recall to repeat some blood work in February and that she would be getting another letter from [place] then. She said that was ok. Routing to AS for FYI

## 2014-01-20 NOTE — Telephone Encounter (Signed)
I spoke with the pt yesterday. See other phone note.

## 2014-01-20 NOTE — Telephone Encounter (Signed)
Noted  

## 2014-01-21 ENCOUNTER — Other Ambulatory Visit: Payer: Self-pay | Admitting: Family Medicine

## 2014-01-22 ENCOUNTER — Ambulatory Visit (INDEPENDENT_AMBULATORY_CARE_PROVIDER_SITE_OTHER): Payer: BC Managed Care – PPO | Admitting: Gastroenterology

## 2014-01-22 ENCOUNTER — Encounter: Payer: Self-pay | Admitting: Gastroenterology

## 2014-01-22 ENCOUNTER — Other Ambulatory Visit: Payer: Self-pay | Admitting: Gastroenterology

## 2014-01-22 VITALS — BP 156/91 | HR 69 | Temp 97.2°F | Ht 61.0 in | Wt 191.0 lb

## 2014-01-22 DIAGNOSIS — K219 Gastro-esophageal reflux disease without esophagitis: Secondary | ICD-10-CM

## 2014-01-22 DIAGNOSIS — D509 Iron deficiency anemia, unspecified: Secondary | ICD-10-CM

## 2014-01-22 DIAGNOSIS — K760 Fatty (change of) liver, not elsewhere classified: Secondary | ICD-10-CM

## 2014-01-22 DIAGNOSIS — D369 Benign neoplasm, unspecified site: Secondary | ICD-10-CM

## 2014-01-22 MED ORDER — FUSION PLUS PO CAPS: 1.0000 | ORAL_CAPSULE | Freq: Every day | ORAL | Status: DC

## 2014-01-22 NOTE — Progress Notes (Signed)
Referring Provider: [name], MD Primary Care Physician:  Tula Nakayama, MD  Primary GI: Dr. [name]   Chief Complaint  Patient presents with  . Follow-up    HPI:   Michaela Peterson presents today in follow-up with history of IDA, GERD, gastroparesis, mildly elevated transaminases in the setting of NAFLD. EGD and colonoscopy completed and benign. No overt signs of melena or hematochezia. Next colonoscopy in 2020 due to history of polyps. CT completed due to chronic abdominal pain, with no acute findings. Fatty liver. Recent labs as noted below. Iron low, ferritin low normal. Not taking iron as directed. No prior capsule study.   Dexilant without improvement. Back on Protonix once daily. Has retrosternal discomfort if eats late or eats the wrong thing. Not taking iron, states it constipates her. Has issues with dentures. Unable to chew nuts, meats well. Otherwise, doing well from a GI standpoint.    Past Medical History  Diagnosis Date  . Diabetes mellitus, type 2     A1c of 6.9 in 05/2010  . Sleep apnea 2009  . MS (multiple sclerosis)   . GERD (gastroesophageal reflux disease)     GastroparesisS.;small hiatal hernia  . Hypertension   . Chronic low back pain   . Nonalcoholic fatty liver disease   . Obesity   . Anemia, iron deficiency   . Adnexal mass     Left; followed by Dr. [name]  . Gastroparesis     Past Surgical History  Procedure Laterality Date  . Dilation and curettage of uterus      4  . Laparoscopy abdomen diagnostic      lysis of adhesions  . Tendon lengthening      Left wrist  . Cholecystectomy    . Knee arthroscopy w/ debridement    . Rotator cuff repair    . Bladder suspension    . Cesarean section      x2   . Umbilical hernia repair    . Colonoscopy  2010    Dr. [name]: tubular adenoma, few scattered diverticula  . Esophagogastroduodenoscopy  2010    Dr. [name]: normal esophagus, small hiatal hernia, questionable pale duodenal mucosa but  negative for celiac sprue.   . Colonoscopy N/A 10/01/2013    Dr. Pixie Casino preparation. Normal rectum. Normal colonic mucosa  . Esophagogastroduodenoscopy N/A 10/01/2013    Dr. Rourk:gastric polyps-status post biopsy. Otherwise, normal EGD. fundic gland polyp, negative H.pylori    Current Outpatient Prescriptions  Medication Sig Dispense Refill  . cycloSPORINE (RESTASIS) 0.05 % ophthalmic emulsion Place 1 drop into both eyes 2 (two) times daily.     . diclofenac sodium (VOLTAREN) 1 % GEL Apply 2 g topically as needed (inflammation/pain).     Marland Kitchen docusate sodium (COLACE) 50 MG capsule Take 50 mg by mouth daily as needed for mild constipation.     Marland Kitchen FARXIGA 10 MG TABS TAKE 1 TABLET BY MOUTH EVERY DAY AT BREAKFAST 30 tablet 4  . glipiZIDE (GLUCOTROL) 10 MG tablet TAKE 1 TABLET (10 MG TOTAL) BY MOUTH 2 (TWO) TIMES DAILY BEFORE A MEAL. 180 tablet 1  . loratadine (CLARITIN) 10 MG tablet TAKE 1 TABLET BY MOUTH EVERY DAY 90 tablet 1  . metFORMIN (GLUCOPHAGE) 1000 MG tablet TAKE 1 TABLET BY MOUTH TWICE A DAY WITH FOOD 60 tablet 5  . mupirocin cream (BACTROBAN) 2 % Apply to affected area 3 times daily 30 g 0  . NIFEdipine (ADALAT CC) 90 MG 24 hr tablet Take 1  tablet (90 mg total) by mouth daily. 90 tablet 3  . Olopatadine HCl (PATANASE) 0.6 % SOLN As directed 30.5 g 2  . pantoprazole (PROTONIX) 40 MG tablet TAKE 1 TABLET BY MOUTH EVERY DAY 30 tablet 3  . phenylephrine (SUDAFED PE) 10 MG TABS tablet Take 10 mg by mouth every 4 (four) hours as needed (congestion).    . Propylene Glycol (SYSTANE BALANCE OP) Apply 1 drop to eye daily.    Marland Kitchen RELION CONFIRM/MICRO TEST test strip USE STRIPS TO TEST BLOOD SUGAR TWICE DAILY 100 each 0  . triamterene-hydrochlorothiazide (MAXZIDE-25) 37.5-25 MG per tablet TAKE 1 TABLET BY MOUTH EVERY DAY 30 tablet 2   No current facility-administered medications for this visit.    Allergies as of 01/22/2014 - Review Complete 01/22/2014  Allergen Reaction Noted  . Ace  inhibitors Cough 03/26/2012  . Aspirin    . Oxycodone-acetaminophen  02/21/2007  . Penicillins    . Statins  10/11/2012    Family History  Problem Relation Age of Onset  . Heart failure Mother   . Hypertension Mother   . Arthritis Mother   . Stroke Father   . Hypertension Sister   . Diabetes Sister   . Heart disease Sister   . Hypertension Son   . Colon cancer Neg Hx     History   Social History  . Marital Status: Married    Spouse Name: [name]     Number of Children: 4  . Years of Education: 13   Occupational History  . unemployed     Social History Main Topics  . Smoking status: Never Smoker   . Smokeless tobacco: Never Used     Comment: patient lives with a smoker   . Alcohol Use: No  . Drug Use: No  . Sexual Activity: Yes   Other Topics Concern  . None   Social History Narrative   Patient lives at home with husband [name].    Patient has 4 children.    Patient has a some college.    Patient is right handed.    Patient is currently not working.     Review of Systems: As mentioned in HPI,.   Physical Exam: BP 156/91 mmHg  Pulse 69  Temp(Src) 97.2 F (36.2 C) (Oral)  Ht 5\' 1"  (1.549 m)  Wt 191 lb (86.637 kg)  BMI 36.11 kg/m2  LMP 08/18/2012 General:   Alert and oriented. No distress noted. Pleasant and cooperative.  Abdomen:  +BS, soft, non-tender and non-distended. No rebound or guarding. No HSM or masses noted. Msk:  Symmetrical without gross deformities. Normal posture. Extremities:  Without edema. Neurologic:  Alert and  oriented x4;  grossly normal neurologically. Skin:  Intact without significant lesions or rashes. Psych:  Alert and cooperative. Normal mood and affect.  Lab Results  Component Value Date   ALT 42* 12/22/2013   AST 35 12/22/2013   ALKPHOS 94 12/22/2013   BILITOT 0.4 12/22/2013   Lab Results  Component Value Date   WBC 8.5 09/17/2013   HGB 12.0 09/17/2013   HCT 36.6 09/17/2013   MCV 76.9* 09/17/2013   PLT 455*  09/17/2013   Lab Results  Component Value Date   IRON 34* 09/17/2013   TIBC 378 01/10/2009   FERRITIN 24 09/17/2013

## 2014-01-22 NOTE — Assessment & Plan Note (Signed)
Continue Protonix. Follow GERD diet. Dietary and behavior modifications discussed.

## 2014-01-22 NOTE — Assessment & Plan Note (Signed)
Follow low-fat diet. Last LFTs on file stable.

## 2014-01-22 NOTE — Assessment & Plan Note (Signed)
Next colonoscopy 2020.

## 2014-01-22 NOTE — Assessment & Plan Note (Signed)
Chronic. Ferritin low normal, iron low. EGD/TCS without significant findings. No prior capsule study. Start iron (fusion plus) daily. Recheck CBC, iron, ferritin in Jan. If persistent or no improvement, proceed with capsule to wrap up GI evaluation. No concerning features at time of visit today.

## 2014-01-22 NOTE — Patient Instructions (Signed)
Start taking the iron samples 1 capsule daily (Fusion plus). I have sent this to your pharmacy. If it is not covered, let me know.   I would like you to do blood work in January. We will be sending you orders for this.   Follow the reflux diet.   Further recommendations to follow after blood work completed!

## 2014-01-22 NOTE — Progress Notes (Signed)
cc'ed to pcp °

## 2014-01-23 ENCOUNTER — Other Ambulatory Visit: Payer: Self-pay | Admitting: Family Medicine

## 2014-01-26 ENCOUNTER — Telehealth: Payer: Self-pay | Admitting: *Deleted

## 2014-01-26 MED ORDER — CLOTRIMAZOLE-BETAMETHASONE 1-0.05 % EX CREA: 1.0000 "application " | TOPICAL_CREAM | Freq: Two times a day (BID) | CUTANEOUS | Status: DC

## 2014-01-26 NOTE — Telephone Encounter (Signed)
Itching and burning around her toes- thinks fungal infection. Wants something called in if the muprocin cream that she already has won't work for it. Please advise

## 2014-01-26 NOTE — Telephone Encounter (Signed)
Pt called stating her toes are itching and burning on the top. Pt is requesting to speak with a nurse about the mupirotine cream it was prescribed to her for the boils pt would like to know if she can put it on her feet. Please advise [phone]

## 2014-01-26 NOTE — Telephone Encounter (Signed)
Pt aware.

## 2014-01-26 NOTE — Telephone Encounter (Signed)
Med sent pls let her know 

## 2014-02-01 ENCOUNTER — Encounter: Payer: Self-pay | Admitting: Nurse Practitioner

## 2014-02-02 ENCOUNTER — Other Ambulatory Visit: Payer: Self-pay | Admitting: Family Medicine

## 2014-03-09 ENCOUNTER — Other Ambulatory Visit: Payer: Self-pay

## 2014-03-09 DIAGNOSIS — D509 Iron deficiency anemia, unspecified: Secondary | ICD-10-CM

## 2014-04-04 ENCOUNTER — Other Ambulatory Visit: Payer: Self-pay | Admitting: Family Medicine

## 2014-04-06 ENCOUNTER — Other Ambulatory Visit: Payer: Self-pay | Admitting: Family Medicine

## 2014-04-18 ENCOUNTER — Other Ambulatory Visit: Payer: Self-pay | Admitting: Family Medicine

## 2014-04-19 ENCOUNTER — Ambulatory Visit (HOSPITAL_COMMUNITY)
Admission: RE | Admit: 2014-04-19 | Discharge: 2014-04-19 | Disposition: A | Discharge status: Home / Self Care | Payer: BLUE CROSS/BLUE SHIELD | Source: Ambulatory Visit | Attending: Emergency Medicine | Admitting: Emergency Medicine

## 2014-04-19 DIAGNOSIS — K760 Fatty (change of) liver, not elsewhere classified: Secondary | ICD-10-CM | POA: Insufficient documentation

## 2014-04-19 DIAGNOSIS — R918 Other nonspecific abnormal finding of lung field: Secondary | ICD-10-CM

## 2014-04-19 DIAGNOSIS — R0602 Shortness of breath: Secondary | ICD-10-CM | POA: Diagnosis not present

## 2014-04-27 ENCOUNTER — Ambulatory Visit: Payer: BC Managed Care – PPO | Admitting: Family Medicine

## 2014-05-25 ENCOUNTER — Other Ambulatory Visit: Payer: Self-pay | Admitting: Family Medicine

## 2014-06-11 ENCOUNTER — Other Ambulatory Visit: Payer: Self-pay | Admitting: Gastroenterology

## 2014-06-11 LAB — CBC WITH DIFFERENTIAL/PLATELET
Basophils Absolute: 0.1 10*3/uL (ref 0.0–0.1)
Basophils Relative: 1 % (ref 0–1)
Eosinophils Absolute: 0.1 10*3/uL (ref 0.0–0.7)
Eosinophils Relative: 1 % (ref 0–5)
HCT: 37.1 % (ref 36.0–46.0)
Hemoglobin: 12.3 g/dL (ref 12.0–15.0)
Lymphocytes Relative: 37 % (ref 12–46)
Lymphs Abs: 3 10*3/uL (ref 0.7–4.0)
MCH: 26.9 pg (ref 26.0–34.0)
MCHC: 33.2 g/dL (ref 30.0–36.0)
MCV: 81.2 fL (ref 78.0–100.0)
MPV: 9.7 fL (ref 8.6–12.4)
Monocytes Absolute: 0.3 10*3/uL (ref 0.1–1.0)
Monocytes Relative: 4 % (ref 3–12)
Neutro Abs: 4.6 10*3/uL (ref 1.7–7.7)
Neutrophils Relative %: 57 % (ref 43–77)
Platelets: 414 10*3/uL — ABNORMAL HIGH (ref 150–400)
RBC: 4.57 MIL/uL (ref 3.87–5.11)
RDW: 16 % — ABNORMAL HIGH (ref 11.5–15.5)
WBC: 8.1 10*3/uL (ref 4.0–10.5)

## 2014-06-12 LAB — COMPLETE METABOLIC PANEL WITHOUT GFR
ALT: 41 U/L — ABNORMAL HIGH (ref 0–35)
AST: 27 U/L (ref 0–37)
Albumin: 4.6 g/dL (ref 3.5–5.2)
Alkaline Phosphatase: 98 U/L (ref 39–117)
BUN: 13 mg/dL (ref 6–23)
CO2: 27 meq/L (ref 19–32)
Calcium: 9.7 mg/dL (ref 8.4–10.5)
Chloride: 104 meq/L (ref 96–112)
Creat: 0.69 mg/dL (ref 0.50–1.10)
GFR, Est African American: >89 mL/min
GFR, Est Non African American: >89 mL/min
Glucose, Bld: 141 mg/dL — ABNORMAL HIGH (ref 70–99)
Potassium: 4.3 meq/L (ref 3.5–5.3)
Sodium: 138 meq/L (ref 135–145)
Total Bilirubin: 0.3 mg/dL (ref 0.2–1.2)
Total Protein: 7.8 g/dL (ref 6.0–8.3)

## 2014-06-12 LAB — HEMOGLOBIN A1C
Hgb A1c MFr Bld: 8.1 % — ABNORMAL HIGH (ref <5.7)
Mean Plasma Glucose: 186 mg/dL — ABNORMAL HIGH (ref <117)

## 2014-06-12 LAB — FERRITIN: Ferritin: 39 ng/mL (ref 10–291)

## 2014-06-12 LAB — IRON: Iron: 38 ug/dL — ABNORMAL LOW (ref 42–145)

## 2014-06-14 ENCOUNTER — Encounter: Payer: Self-pay | Admitting: Family Medicine

## 2014-06-14 ENCOUNTER — Ambulatory Visit (INDEPENDENT_AMBULATORY_CARE_PROVIDER_SITE_OTHER): Payer: BLUE CROSS/BLUE SHIELD | Admitting: Family Medicine

## 2014-06-14 VITALS — BP 120/70 | HR 80 | Resp 16 | Ht 61.0 in | Wt 196.0 lb

## 2014-06-14 DIAGNOSIS — I10 Essential (primary) hypertension: Secondary | ICD-10-CM

## 2014-06-14 DIAGNOSIS — G990 Autonomic neuropathy in diseases classified elsewhere: Secondary | ICD-10-CM

## 2014-06-14 DIAGNOSIS — I152 Hypertension secondary to endocrine disorders: Secondary | ICD-10-CM

## 2014-06-14 DIAGNOSIS — J309 Allergic rhinitis, unspecified: Secondary | ICD-10-CM | POA: Diagnosis not present

## 2014-06-14 DIAGNOSIS — E119 Type 2 diabetes mellitus without complications: Secondary | ICD-10-CM

## 2014-06-14 DIAGNOSIS — E1143 Type 2 diabetes mellitus with diabetic autonomic (poly)neuropathy: Secondary | ICD-10-CM

## 2014-06-14 DIAGNOSIS — E1159 Type 2 diabetes mellitus with other circulatory complications: Secondary | ICD-10-CM

## 2014-06-14 DIAGNOSIS — G35 Multiple sclerosis: Secondary | ICD-10-CM

## 2014-06-14 DIAGNOSIS — E669 Obesity, unspecified: Secondary | ICD-10-CM

## 2014-06-14 DIAGNOSIS — K219 Gastro-esophageal reflux disease without esophagitis: Secondary | ICD-10-CM

## 2014-06-14 DIAGNOSIS — E1165 Type 2 diabetes mellitus with hyperglycemia: Principal | ICD-10-CM

## 2014-06-14 DIAGNOSIS — M25561 Pain in right knee: Secondary | ICD-10-CM

## 2014-06-14 DIAGNOSIS — IMO0002 Reserved for concepts with insufficient information to code with codable children: Secondary | ICD-10-CM

## 2014-06-14 DIAGNOSIS — E1169 Type 2 diabetes mellitus with other specified complication: Secondary | ICD-10-CM

## 2014-06-14 NOTE — Patient Instructions (Addendum)
F/u in 3.5 month, call if you need me before  It is important that you exercise regularly at least 30 minutes 5 times a week. If you develop chest pain, have severe difficulty breathing, or feel very tired, stop exercising immediately and seek medical attention   A healthy diet is rich in fruit, vegetables and whole grains. Poultry fish, nuts and beans are a healthy choice for protein rather then red meat. A low sodium diet and drinking 64 ounces of water daily is generally recommended. Oils and sweet should be limited. Carbohydrates especially for those who are diabetic or overweight, should be limited to 60-45 gram per meal. It is important to eat on a regular schedule, at least 3 times daily. Snacks should be primarily fruits, vegetables or nuts.  You are referred to diabetic educator, morning appt as requested  Knees , call when decided  Keep eye exam appt, pls schedule  Goal for fasting blood sugar ranges from 90 to 120 and 2 hours after any meal or at bedtime should be between 140 to 180.    HBA1C, chem 7 and EGFR and TSH in 3 months, non fasting

## 2014-06-16 ENCOUNTER — Other Ambulatory Visit: Payer: Self-pay

## 2014-06-16 LAB — LIPID PANEL
Cholesterol: 187 mg/dL (ref 0–200)
HDL: 75 mg/dL (ref >=46)
LDL Cholesterol: 101 mg/dL — ABNORMAL HIGH (ref 0–99)
Total CHOL/HDL Ratio: 2.5 Ratio
Triglycerides: 54 mg/dL (ref <150)
VLDL: 11 mg/dL (ref 0–40)

## 2014-06-16 MED ORDER — DAPAGLIFLOZIN PROPANEDIOL 10 MG PO TABS: ORAL_TABLET | ORAL | Status: DC

## 2014-06-16 MED ORDER — TRIAMTERENE-HCTZ 37.5-25 MG PO TABS: 1.0000 | ORAL_TABLET | Freq: Every day | ORAL | Status: DC

## 2014-06-16 MED ORDER — LORATADINE 10 MG PO TABS: 10.0000 mg | ORAL_TABLET | Freq: Every day | ORAL | Status: DC

## 2014-06-16 MED ORDER — GLIPIZIDE 10 MG PO TABS: ORAL_TABLET | ORAL | Status: DC

## 2014-06-16 NOTE — Progress Notes (Signed)
Quick Note:  Not sure how this labs got attached to my name. I've never seen the patient and don't usually order lipid panels. Looks fine. Let patient know. Other labs ordered by Laban Emperor to be addressed.   FYI Dr. Moshe Cipro. ______

## 2014-06-17 ENCOUNTER — Telehealth: Payer: Self-pay | Admitting: Family Medicine

## 2014-06-17 ENCOUNTER — Other Ambulatory Visit: Payer: Self-pay | Admitting: Family Medicine

## 2014-06-17 NOTE — Telephone Encounter (Signed)
Pls let her know that I recommend she start a very low dose statin , pravachol 10 mg, to help to reduce risk of heart disease, as she is diabetic. Liver enzyme is only slightly elevated, will need cmp and EGFR instead of bMP ordered for July visit, if you have questions about this , pls ask Let her know cholesterol "slightly higher than it should bve, asa diabetic, needs to reduce fried and fatty foods   pLS send in the entered pravachol after you spk with her

## 2014-06-18 NOTE — Telephone Encounter (Signed)
Patient is refusing this medication at this time

## 2014-06-18 NOTE — Telephone Encounter (Signed)
Called patient and left message for them to return call at the office   

## 2014-07-21 ENCOUNTER — Other Ambulatory Visit: Payer: Self-pay | Admitting: Family Medicine

## 2014-07-23 ENCOUNTER — Other Ambulatory Visit: Payer: Self-pay | Admitting: Family Medicine

## 2014-07-23 DIAGNOSIS — Z1231 Encounter for screening mammogram for malignant neoplasm of breast: Secondary | ICD-10-CM

## 2014-07-26 NOTE — Progress Notes (Signed)
Quick Note:  Hgb normal at 12.3. Iron persistently around upper 30 range, still considered below normal. Ferritin is 39, which is low normal but still improved from the past. Is she taking iron? If so, to wrap up the GI evaluation, I would recommend a capsule study. HOWEVER: due to past abdominal surgeries and likely adhesive disease, proceed with agile patency capsule FIRST, then capsule study. ______

## 2014-07-27 NOTE — Progress Notes (Signed)
Quick Note:  Well, I had sent in iron to her pharmacy back in Nov 2015. Yes, start taking iron 325 mg po BID. Need to hold it 7 days before actual capsule study. Yes, proceed with agile capsule FIRST, then regular capsule study if no problems with agile. ______

## 2014-07-29 ENCOUNTER — Ambulatory Visit: Payer: BC Managed Care – PPO | Admitting: Nurse Practitioner

## 2014-07-29 ENCOUNTER — Encounter: Payer: Self-pay | Admitting: Nurse Practitioner

## 2014-07-29 ENCOUNTER — Ambulatory Visit (INDEPENDENT_AMBULATORY_CARE_PROVIDER_SITE_OTHER): Payer: BLUE CROSS/BLUE SHIELD | Admitting: Nurse Practitioner

## 2014-07-29 ENCOUNTER — Other Ambulatory Visit: Payer: Self-pay

## 2014-07-29 VITALS — BP 151/82 | HR 73 | Ht 61.0 in | Wt 193.2 lb

## 2014-07-29 DIAGNOSIS — G35 Multiple sclerosis: Secondary | ICD-10-CM | POA: Diagnosis not present

## 2014-07-29 DIAGNOSIS — D509 Iron deficiency anemia, unspecified: Secondary | ICD-10-CM

## 2014-07-29 NOTE — Progress Notes (Signed)
[facility]  PATIENT: Michaela Peterson DOB: September 03, 1955   REASON FOR VISIT: Follow-up for history of multiple sclerosis  HISTORY FROM: Patient    HISTORY OF PRESENT ILLNESS:Michaela Peterson, 59 year old female returns for followup. She was last seen 07/27/2013. She has a history of multiple sclerosis without significant clinical progression. Last MRI of the brain in 2011 did not show progression from previous, she is not interested in resuming any MS treatment even after discussing the importance of maintaining her current functional status. She is a prior patient of Dr. Doy Mince who left our practice. She was diagnosed in 2004 on the basis of demyelinating plaques in the cervical spine and typical CSF findings. She was initially treated with Rebif but discontinued the drug due to her injection site reactions in April of 2005. She has not been on disease modifying treatment since that time, she reports some numbness and tingling in the feet which she feels has been stable for many years. She is also a diabetic which is not in good control with most recent hemoglobin A1c 8.1 .She also  has hypertension. She denies any double vision, dysarthria, dysphagia, bowel or bladder difficulties. She has not had any falls. She denies any balance issues.  She returns for reevaluation   REVIEW OF SYSTEMS: Full 14 system review of systems performed and notable only for those listed, all others are neg:  Constitutional: neg  Cardiovascular: neg Ear/Nose/Throat: neg  Skin: neg Eyes: neg Respiratory: neg Gastroitestinal: neg  Hematology/Lymphatic: neg  Endocrine: neg Musculoskeletal: Joint pain, back pain Allergy/Immunology: neg Neurological: neg Psychiatric: neg Sleep : Snoring ALLERGIES: Allergies  Allergen Reactions  . Ace Inhibitors Cough  . Aspirin     REACTION: unknown reaction  . Oxycodone-Acetaminophen   . Penicillins   . Statins     NAFLD    HOME MEDICATIONS: Outpatient  Prescriptions Prior to Visit  Medication Sig Dispense Refill  . clotrimazole-betamethasone (LOTRISONE) cream Apply 1 application topically 2 (two) times daily. (Patient taking differently: Apply 1 application topically 2 (two) times daily as needed. ) 45 g 1  . cycloSPORINE (RESTASIS) 0.05 % ophthalmic emulsion Place 1 drop into both eyes 2 (two) times daily.     . dapagliflozin propanediol (FARXIGA) 10 MG TABS tablet TAKE 1 TABLET BY MOUTH EVERY DAY AT BREAKFAST 30 tablet 4  . diclofenac sodium (VOLTAREN) 1 % GEL Apply 2 g topically as needed (inflammation/pain).     Marland Kitchen docusate sodium (COLACE) 50 MG capsule Take 50 mg by mouth daily as needed for mild constipation.     Marland Kitchen FIBER COMPLETE PO Take 5 mg by mouth daily.    Marland Kitchen glipiZIDE (GLUCOTROL) 10 MG tablet TAKE 1 TABLET (10 MG TOTAL) BY MOUTH 2 (TWO) TIMES DAILY BEFORE A MEAL. 180 tablet 1  . loratadine (CLARITIN) 10 MG tablet Take 1 tablet (10 mg total) by mouth daily. 90 tablet 1  . metFORMIN (GLUCOPHAGE) 1000 MG tablet TAKE 1 TABLET BY MOUTH TWICE A DAY WITH FOOD 60 tablet 5  . NIFEdipine (ADALAT CC) 90 MG 24 hr tablet TAKE 1 TABLET DAILY 90 tablet 1  . Olopatadine HCl (PATANASE) 0.6 % SOLN As directed 30.5 g 2  . pantoprazole (PROTONIX) 40 MG tablet TAKE 1 TABLET BY MOUTH EVERY DAY 30 tablet 3  . Propylene Glycol (SYSTANE BALANCE OP) Apply 1 drop to eye daily.    Marland Kitchen RELION CONFIRM/MICRO TEST test strip USE STRIPS TO TEST BLOOD SUGAR TWICE DAILY 100 each 0  . triamterene-hydrochlorothiazide (  MAXZIDE-25) 37.5-25 MG per tablet Take 1 tablet by mouth daily. 30 tablet 4  . metFORMIN (GLUCOPHAGE) 1000 MG tablet TAKE 1 TABLET BY MOUTH TWICE A DAY WITH FOOD 60 tablet 4  . pravastatin (PRAVACHOL) 10 MG tablet Take 1 tablet (10 mg total) by mouth daily. 30 tablet 5   No facility-administered medications prior to visit.    PAST MEDICAL HISTORY: Past Medical History  Diagnosis Date  . Diabetes mellitus, type 2     A1c of 6.9 in 05/2010  . Sleep apnea  2009  . MS (multiple sclerosis)   . GERD (gastroesophageal reflux disease)     GastroparesisS.;small hiatal hernia  . Hypertension   . Chronic low back pain   . Nonalcoholic fatty liver disease   . Obesity   . Anemia, iron deficiency   . Adnexal mass     Left; followed by Dr. [name]  . Gastroparesis     PAST SURGICAL HISTORY: Past Surgical History  Procedure Laterality Date  . Dilation and curettage of uterus      4  . Laparoscopy abdomen diagnostic      lysis of adhesions  . Tendon lengthening      Left wrist  . Cholecystectomy    . Knee arthroscopy w/ debridement    . Rotator cuff repair    . Bladder suspension    . Cesarean section      x2   . Umbilical hernia repair    . Colonoscopy  2010    Dr. [name]: tubular adenoma, few scattered diverticula  . Esophagogastroduodenoscopy  2010    Dr. [name]: normal esophagus, small hiatal hernia, questionable pale duodenal mucosa but negative for celiac sprue.   . Colonoscopy N/A 10/01/2013    Dr. Pixie Casino preparation. Normal rectum. Normal colonic mucosa  . Esophagogastroduodenoscopy N/A 10/01/2013    Dr. Rourk:gastric polyps-status post biopsy. Otherwise, normal EGD. fundic gland polyp, negative H.pylori    FAMILY HISTORY: Family History  Problem Relation Age of Onset  . Heart failure Mother   . Hypertension Mother   . Arthritis Mother   . Stroke Father   . Hypertension Sister   . Diabetes Sister   . Heart disease Sister   . Hypertension Son   . Colon cancer Neg Hx     SOCIAL HISTORY: History   Social History  . Marital Status: Married    Spouse Name: [name]   . Number of Children: 4  . Years of Education: 13   Occupational History  . unemployed     Social History Main Topics  . Smoking status: Never Smoker   . Smokeless tobacco: Never Used     Comment: patient lives with a smoker   . Alcohol Use: No  . Drug Use: No  . Sexual Activity: Yes   Other Topics Concern  . Not on file   Social History  Narrative   Patient lives at home with husband [name].    Patient has 4 children.    Patient has a some college.    Patient is right handed.    Patient is currently not working.      PHYSICAL EXAM  Filed Vitals:   07/29/14 0915  BP: 151/82  Pulse: 73  Height: 5' 1"  (1.549 m)  Weight: 193 lb 3.2 oz (87.635 kg)   Body mass index is 36.52 kg/(m^2). Generalized: Well developed, obese female in no acute distress  Head: normocephalic and atraumatic,. Oropharynx benign  Neck: Supple, no carotid bruits  Musculoskeletal:  No deformity   Neurological examination   Mentation: Alert oriented to time, place, history taking. Follows all commands speech and language fluent  Cranial nerve II-XII: Visual acuity 20/30 bilaterally. Fundoscopic exam reveals sharp disc margins.Pupils were equal round reactive to light extraocular movements were full, visual field were full on confrontational test. Facial sensation and strength were normal. hearing was intact to finger rubbing bilaterally. Uvula tongue midline. head turning and shoulder shrug were normal and symmetric.Tongue protrusion into cheek strength was normal. Motor: normal bulk and tone, full strength in the BUE, BLE, fine finger movements normal, no pronator drift. No focal weakness Sensory: normal and symmetric to light touch, pinprick, and vibration in the upper and lower extremities Coordination: finger-nose-finger, heel-to-shin bilaterally, no dysmetria Reflexes: Brachioradialis 2/2, biceps 2/2, triceps 2/2, patellar 2/2, Achilles absent, plantar responses were flexor bilaterally. Gait and Station: Rising up from seated position without assistance, normal stance, moderate stride, good arm swing, smooth turning, able to perform tiptoe, and heel walking without difficulty. Tandem gait is steady. Romberg negative DIAGNOSTIC DATA (LABS, IMAGING, TESTING) - I reviewed patient records, labs, notes, testing and imaging myself where  available.  Lab Results  Component Value Date   WBC 8.1 06/11/2014   HGB 12.3 06/11/2014   HCT 37.1 06/11/2014   MCV 81.2 06/11/2014   PLT 414* 06/11/2014      Component Value Date/Time   NA 138 06/11/2014 1047   K 4.3 06/11/2014 1047   CL 104 06/11/2014 1047   CO2 27 06/11/2014 1047   GLUCOSE 141* 06/11/2014 1047   BUN 13 06/11/2014 1047   CREATININE 0.69 06/11/2014 1047   CREATININE 0.90 10/06/2013 0823   CALCIUM 9.7 06/11/2014 1047   PROT 7.8 06/11/2014 1047   ALBUMIN 4.6 06/11/2014 1047   AST 27 06/11/2014 1047   ALT 41* 06/11/2014 1047   ALKPHOS 98 06/11/2014 1047   BILITOT 0.3 06/11/2014 1047   GFRNONAA >89 06/11/2014 1047   GFRNONAA >60 04/13/2009 1046   GFRAA >89 06/11/2014 1047   GFRAA  04/13/2009 1046    >60        The eGFR has been calculated using the MDRD equation. This calculation has not been validated in all clinical situations. eGFR's persistently <60 mL/min signify possible Chronic Kidney Disease.   Lab Results  Component Value Date   CHOL 187 06/11/2014   HDL 75 06/11/2014   LDLCALC 101* 06/11/2014   TRIG 54 06/11/2014   CHOLHDL 2.5 06/11/2014   Lab Results  Component Value Date   HGBA1C 8.1* 06/11/2014    ASSESSMENT AND PLAN  59 y.o. year old female  has a past medical history of Diabetes mellitus, type 2; Sleep apnea (2009); MS (multiple sclerosis); diagnosed in 2004 and has been off of disease modifying therapy since April 2005 due to injection site problems. Most recent MRI of the brain in 2011 did not show any progression of the disease since 2007.  Once again discussed disease modifying agents specifically oral preparations that are available now for multiple sclerosis the patient is not interested in any treatment for her multiple sclerosis. She is not interested in repeat MRI of the brain  She will continue to follow up yearly and when necessary Dennie Bible, [facility], [facility], APRN  [facility] [address],  [place], [place] [address] [phone]

## 2014-07-29 NOTE — Patient Instructions (Signed)
MS symptoms are stable Regular exercise for overall health and well-being along with weight loss Follow-up yearly and when necessary

## 2014-08-01 NOTE — Assessment & Plan Note (Signed)
Increased pain and instability, pt not yet ready to have orhto re eval, cautioned to avoid falls, she will call when ready

## 2014-08-01 NOTE — Assessment & Plan Note (Signed)
Encouraged to keep f/u with neurology , which she was not doing as she should have in the past , but this has improved, denies any neurologic symptoms

## 2014-08-01 NOTE — Assessment & Plan Note (Signed)
Controlled, no change in medication  

## 2014-08-01 NOTE — Assessment & Plan Note (Signed)
Deteriorated Patient educated about the importance of limiting  Carbohydrate intake , the need to commit to daily physical activity for a minimum of 30 minutes , and to commit weight loss.  Diabetic Labs Latest Ref Rng 06/11/2014 12/22/2013 10/19/2013 10/06/2013 08/11/2013  HbA1c <5.7 % 8.1(H) 7.8(H) - - 9.2(H)  Microalbumin 0.00 - 1.89 mg/dL - - 0.50 - -  Micro/Creat Ratio 0.0 - 30.0 mg/g - - 13.3 - -  Chol 0 - 200 mg/dL 187 162 - - -  HDL >=46 mg/dL 75 66 - - -  Calc LDL 0 - 99 mg/dL 101(H) 85 - - -  Triglycerides <150 mg/dL 54 55 - - -  Creatinine 0.50 - 1.10 mg/dL 0.69 0.77 - 0.90 0.86   BP/Weight 07/29/2014 06/14/2014 01/22/2014 12/23/2013 11/10/2013 10/19/2013 08/16/3014  Systolic BP 010 932 355 732 202 542 706  Diastolic BP 82 70 91 80 70 78 51  Wt. (Lbs) 193.2 196 191 190 189 191.12 189  BMI 36.52 37.05 36.11 35.92 35.73 35.53 35.73   Foot/eye exam completion dates Latest Ref Rng 08/12/2013 05/28/2013  Eye Exam No Retinopathy - No Retinopathy  Foot Form Completion - Done -

## 2014-08-01 NOTE — Progress Notes (Signed)
Subjective:    Patient ID: Michaela Peterson, female    DOB: 1956/02/03, 59 y.o.   MRN: [id number]  HPI The PT is here for follow up and re-evaluation of chronic medical conditions, medication management and review of any available recent lab and radiology data.  Preventive health is updated, specifically  Cancer screening and Immunization.    The PT denies any adverse reactions to current medications since the last visit.  C/o increased knee pain and instability also wriost pain but holding off on ortho at this time Denies polyuria, polydipsia, blurred vision , or hypoglycemic episodes. Not testing as regularly as she should and states she knows blood sugars vary more than they should    Review of Systems See HPI Denies recent fever or chills. Denies sinus pressure, nasal congestion, ear pain or sore throat. Denies chest congestion, productive cough or wheezing. Denies chest pains, palpitations and leg swelling Denies abdominal pain, nausea, vomiting,diarrhea or constipation.   Denies dysuria, frequency, hesitancy or incontinence.  Denies headaches, seizures, numbness, or tingling. Denies depression, anxiety or insomnia. Denies skin break down or rash.        Objective:   Physical Exam BP 120/70 mmHg  Pulse 80  Resp 16  Ht 5\' 1"  (1.549 m)  Wt 196 lb (88.905 kg)  BMI 37.05 kg/m2  SpO2 97%  LMP 08/18/2012 Patient alert and oriented and in no cardiopulmonary distress.  HEENT: No facial asymmetry, EOMI,   oropharynx pink and moist.  Neck supple no JVD, no mass.  Chest: Clear to auscultation bilaterally.  CVS: S1, S2 no murmurs, no S3.Regular rate.  ABD: Soft non tender.   Ext: No edema  MS: Adequate ROM spine, shoulders, hips and reduced ROM knees.  Skin: Intact, no ulcerations or rash noted.  Psych: Good eye contact, normal affect. Memory intact not anxious or depressed appearing.  CNS: CN 2-12 intact, power,  normal throughout.no focal deficits  noted.        Assessment & Plan:  Hypertension goal BP (blood pressure) < 140/90 Controlled, no change in medication DASH diet and commitment to daily physical activity for a minimum of 30 minutes discussed and encouraged, as a part of hypertension management. The importance of attaining a healthy weight is also discussed.  BP/Weight 07/29/2014 06/14/2014 01/22/2014 12/23/2013 11/10/2013 10/19/2013 5/53/7482  Systolic BP 707 867 544 920 100 712 197  Diastolic BP 82 70 91 80 70 78 51  Wt. (Lbs) 193.2 196 191 190 189 191.12 189  BMI 36.52 37.05 36.11 35.92 35.73 35.53 35.73         Uncontrolled diabetes mellitus with peripheral autonomic neuropathy Deteriorated Patient educated about the importance of limiting  Carbohydrate intake , the need to commit to daily physical activity for a minimum of 30 minutes , and to commit weight loss.  Diabetic Labs Latest Ref Rng 06/11/2014 12/22/2013 10/19/2013 10/06/2013 08/11/2013  HbA1c <5.7 % 8.1(H) 7.8(H) - - 9.2(H)  Microalbumin 0.00 - 1.89 mg/dL - - 0.50 - -  Micro/Creat Ratio 0.0 - 30.0 mg/g - - 13.3 - -  Chol 0 - 200 mg/dL 187 162 - - -  HDL >=46 mg/dL 75 66 - - -  Calc LDL 0 - 99 mg/dL 101(H) 85 - - -  Triglycerides <150 mg/dL 54 55 - - -  Creatinine 0.50 - 1.10 mg/dL 0.69 0.77 - 0.90 0.86   BP/Weight 07/29/2014 06/14/2014 01/22/2014 12/23/2013 11/10/2013 10/19/2013 5/88/3254  Systolic BP 982 641 583 094 110 122 109  Diastolic BP 82 70 91 80 70 78 51  Wt. (Lbs) 193.2 196 191 190 189 191.12 189  BMI 36.52 37.05 36.11 35.92 35.73 35.53 35.73   Foot/eye exam completion dates Latest Ref Rng 08/12/2013 05/28/2013  Eye Exam No Retinopathy - No Retinopathy  Foot Form Completion - Done -        Obesity, diabetes, and hypertension syndrome Deteriorated. Patient re-educated about  the importance of commitment to a  minimum of 150 minutes of exercise per week.  The importance of healthy food choices with portion control discussed. Encouraged to  start a food diary, count calories and to consider  joining a support group. Sample diet sheets offered. Goals set by the patient for the next several months.   Weight /BMI 07/29/2014 06/14/2014 01/22/2014  WEIGHT 193 lb 3.2 oz 196 lb 191 lb  HEIGHT 5\' 1"  5\' 1"  5\' 1"   BMI 36.52 kg/m2 37.05 kg/m2 36.11 kg/m2    Current exercise per week 90 minutes.    Allergic rhinitis Controlled, no change in  management     GERD Controlled, no change in medication    Knee pain, right anterior Increased pain and instability, pt not yet ready to have orhto re eval, cautioned to avoid falls, she will call when ready   MULTIPLE SCLEROSIS Encouraged to keep f/u with neurology , which she was not doing as she should have in the past , but this has improved, denies any neurologic symptoms

## 2014-08-01 NOTE — Assessment & Plan Note (Signed)
Controlled, no change in medication DASH diet and commitment to daily physical activity for a minimum of 30 minutes discussed and encouraged, as a part of hypertension management. The importance of attaining a healthy weight is also discussed.  BP/Weight 07/29/2014 06/14/2014 01/22/2014 12/23/2013 11/10/2013 10/19/2013 9/41/7408  Systolic BP 144 818 563 149 702 637 858  Diastolic BP 82 70 91 80 70 78 51  Wt. (Lbs) 193.2 196 191 190 189 191.12 189  BMI 36.52 37.05 36.11 35.92 35.73 35.53 35.73

## 2014-08-01 NOTE — Assessment & Plan Note (Signed)
Deteriorated. Patient re-educated about  the importance of commitment to a  minimum of 150 minutes of exercise per week.  The importance of healthy food choices with portion control discussed. Encouraged to start a food diary, count calories and to consider  joining a support group. Sample diet sheets offered. Goals set by the patient for the next several months.   Weight /BMI 07/29/2014 06/14/2014 01/22/2014  WEIGHT 193 lb 3.2 oz 196 lb 191 lb  HEIGHT 5\' 1"  5\' 1"  5\' 1"   BMI 36.52 kg/m2 37.05 kg/m2 36.11 kg/m2    Current exercise per week 90 minutes.

## 2014-08-01 NOTE — Assessment & Plan Note (Signed)
Controlled , no change in management 

## 2014-08-02 ENCOUNTER — Telehealth: Payer: Self-pay

## 2014-08-02 NOTE — Telephone Encounter (Signed)
Noted thanks °

## 2014-08-02 NOTE — Telephone Encounter (Signed)
She has been taking the [place] 10 mg but admits to sometimes missing doses a couple times a week. She will try to do better. Reports her fasting readings are usually between 121-155

## 2014-08-11 ENCOUNTER — Ambulatory Visit (HOSPITAL_COMMUNITY)
Admission: RE | Admit: 2014-08-11 | Discharge: 2014-08-11 | Disposition: A | Discharge status: Home / Self Care | Payer: BLUE CROSS/BLUE SHIELD | Source: Ambulatory Visit | Attending: Internal Medicine | Admitting: Internal Medicine

## 2014-08-11 ENCOUNTER — Encounter (HOSPITAL_COMMUNITY): Payer: Self-pay | Admitting: *Deleted

## 2014-08-11 ENCOUNTER — Encounter (HOSPITAL_COMMUNITY)
Admission: RE | Disposition: A | Discharge status: Home / Self Care | Payer: Self-pay | Source: Ambulatory Visit | Attending: Internal Medicine

## 2014-08-11 DIAGNOSIS — D509 Iron deficiency anemia, unspecified: Secondary | ICD-10-CM | POA: Insufficient documentation

## 2014-08-11 DIAGNOSIS — K5900 Constipation, unspecified: Secondary | ICD-10-CM | POA: Diagnosis not present

## 2014-08-11 HISTORY — DX: Unspecified osteoarthritis, unspecified site: M19.90

## 2014-08-11 HISTORY — PX: AGILE CAPSULE: SHX5420

## 2014-08-11 SURGERY — AGILE CAPSULE

## 2014-08-12 ENCOUNTER — Encounter (HOSPITAL_COMMUNITY): Payer: Self-pay | Admitting: Internal Medicine

## 2014-08-12 ENCOUNTER — Ambulatory Visit (HOSPITAL_COMMUNITY): Payer: BLUE CROSS/BLUE SHIELD

## 2014-08-12 DIAGNOSIS — K5900 Constipation, unspecified: Secondary | ICD-10-CM | POA: Diagnosis not present

## 2014-08-13 ENCOUNTER — Encounter: Payer: Self-pay | Admitting: Nutrition

## 2014-08-13 ENCOUNTER — Encounter: Payer: BLUE CROSS/BLUE SHIELD | Attending: Family Medicine | Admitting: Nutrition

## 2014-08-13 VITALS — Ht 61.0 in | Wt 192.0 lb

## 2014-08-13 DIAGNOSIS — E118 Type 2 diabetes mellitus with unspecified complications: Secondary | ICD-10-CM | POA: Diagnosis present

## 2014-08-13 DIAGNOSIS — Z6835 Body mass index (BMI) 35.0-35.9, adult: Secondary | ICD-10-CM | POA: Diagnosis not present

## 2014-08-13 DIAGNOSIS — E669 Obesity, unspecified: Secondary | ICD-10-CM | POA: Insufficient documentation

## 2014-08-13 DIAGNOSIS — Z713 Dietary counseling and surveillance: Secondary | ICD-10-CM | POA: Diagnosis not present

## 2014-08-13 DIAGNOSIS — E1165 Type 2 diabetes mellitus with hyperglycemia: Secondary | ICD-10-CM

## 2014-08-13 DIAGNOSIS — IMO0002 Reserved for concepts with insufficient information to code with codable children: Secondary | ICD-10-CM

## 2014-08-13 NOTE — Patient Instructions (Signed)
Goal: 1. Follow Plate Method. 2. Eat three balanced meals daily. 3. Do not skip meals. 4. Avoid Kem Kays and all you can eat buffets. 5. Cut out snacks between meals. 6. Cut out sweets, cakes, cookies, sodas, diet sodas, tea, juices and junk food. 7. Increase fresh fruits and vegetables. 8. Drink 5 bottles of water daily 9. Get A1C to 7% in three months.

## 2014-08-13 NOTE — Progress Notes (Signed)
  Medical Nutrition Therapy:  Appt start time: 0930 end time:  1030.  Assessment:  Primary concerns today: Diabetes Type 2.. Lives with her husband and son. She does the cooking and shopping. Does a lot of frying and trying to cut back and baking and broiling more. Most recent A1C was 8.1%. Physical activity: Not exercising much. Has some burning in her feet. She sees her Podiatrist. She has to schedule her eye doctor appt. Meds: Metformin 1000 mg BID, Glipizide and Farxiga Admits to only eating 2 meals a day and overeating a lot. Eats a lot of fried food. Skip breakfast usually. Can't sleep a night due to frequent urination. Sleeps in in the morning and meal schedules are off schedule. She is willing to improve meal pattern and eating habits to get DM under better goal with A1C of less than 7%.    Preferred Learning Style:   Auditory  Visual  Hands on   Learning Readiness  Ready  Change in progress  MEDICATIONS: See list   DIETARY INTAKE:   24-hr recall:  B ( AM): skips usually: 1 cup of fruit or yogurt sometimes or frosted flakes or 2 eggs Snk ( AM): none L ( PM): [place]: broccoli, caulidfloer, string beans, yams, fish-fried 2-hot wings and roll,  Sweet team. Snk ( PM): none D ( PM): 5-6 fried squash, 1/2 [place] sandwich, water Snk ( PM): 2-3 pb crackers, or chips, Beverages: water, Diet Mt Dew or Sweet Tea or Pepsi  Usual physical activity: ADL's.  Estimated energy needs: 1500 calories 170 g carbohydrates 112 g protein 42 g fat  Progress Towards Goal(s):  In progress.   Nutritional Diagnosis:  NB-1.2 Harmful beliefs/attitudes about food or nutrition-related topics (use with caution) As related to Diabetes.  As evidenced by A1C 8.1%.    Intervention:  Nutrition and diabetes education provided on disease, CHO counting, meal planning, target ranges for blood sugars, monitoring and using results, portion sizes, signs/symptoms and treatment of hyper/hypoglycemia  and prevention of complications.. Low fat high fiber low salt diet.  Goal: 1. Follow Plate Method. 2. Eat three balanced meals daily. 3. Do not skip meals. 4. Avoid Kem Kays and all you can eat buffets. 5. Cut out snacks between meals. 6. Cut out sweets, cakes, cookies, sodas, diet sodas, tea, juices and junk food. 7. Increase fresh fruits and vegetables. 8. Drink 5 bottles of water daily 9. Get A1C to 7% in three months.   Teaching Method Utilized:  Visual Auditory Hands on  Handouts given during visit include:  The Plate Method  Meal Plan Card  Diabetes Instructions  Barriers to learning/adherence to lifestyle change: None  Demonstrated degree of understanding via:  Teach Back   Monitoring/Evaluation:  Dietary intake, exercise, meal planning, SBG, and body weight in 1 month(s).

## 2014-08-16 ENCOUNTER — Telehealth: Payer: Self-pay | Admitting: General Practice

## 2014-08-16 ENCOUNTER — Other Ambulatory Visit: Payer: Self-pay

## 2014-08-16 NOTE — Telephone Encounter (Signed)
Insurance approval is pending

## 2014-08-16 NOTE — Telephone Encounter (Signed)
-----   Message from Orvil Feil, NP sent at 08/16/2014 11:24 AM EDT ----- The xray wasn't forwarded to me to sign off on, so I didn't know it was done. I spoke with Thornton Papas, and he doesn't see a capsule there. So, yes, we can set up for a capsule study.   ----- Message -----    From: Idamae Schuller    Sent: 08/16/2014  11:10 AM      To: Daneil Dolin, MD, Orvil Feil, NP  Patient had an agile capsule stduy done last week.    Please decline the H&P request and note "it's not required".  Vicente Males, are we proceeding with the capsule study?  ----- Message -----    From: Daneil Dolin, MD    Sent: 08/16/2014   9:09 AM      To: Idamae Schuller  They have asked for H&P. Don't need H&P's for capsule studies. Was his capsule study done. Who is reading ?

## 2014-08-16 NOTE — Telephone Encounter (Signed)
Please see message below from Vicente Males to schedule the patient for a capsule study.   Hgb normal at 12.3. Iron persistently around upper 30 range, still considered below normal. Ferritin is 39, which is low normal but still improved from the past. Is she taking iron? If so, to wrap up the GI evaluation, I would recommend a capsule study.

## 2014-08-18 NOTE — Telephone Encounter (Signed)
refaxed clinical notes to [facility]

## 2014-08-19 ENCOUNTER — Telehealth: Payer: Self-pay | Admitting: Gastroenterology

## 2014-08-19 NOTE — Telephone Encounter (Signed)
Opened in error

## 2014-08-19 NOTE — Telephone Encounter (Signed)
Contacted insurance. Case is still pending.

## 2014-08-19 NOTE — Telephone Encounter (Deleted)
-----   Message from Idamae Schuller sent at 08/16/2014 11:10 AM EDT ----- Patient had an agile capsule stduy done last week.    Please decline the H&P request and note "it's not required".  Vicente Males, are we proceeding with the capsule study?  ----- Message -----    From: Daneil Dolin, MD    Sent: 08/16/2014   9:09 AM      To: Idamae Schuller  They have asked for H&P. Don't need H&P's for capsule studies. Was his capsule study done. Who is reading ?

## 2014-08-23 ENCOUNTER — Other Ambulatory Visit: Payer: Self-pay

## 2014-08-23 ENCOUNTER — Ambulatory Visit (HOSPITAL_COMMUNITY)
Admission: RE | Admit: 2014-08-23 | Discharge: 2014-08-23 | Disposition: A | Discharge status: Home / Self Care | Payer: BLUE CROSS/BLUE SHIELD | Source: Ambulatory Visit | Attending: Family Medicine | Admitting: Family Medicine

## 2014-08-23 DIAGNOSIS — Z1231 Encounter for screening mammogram for malignant neoplasm of breast: Secondary | ICD-10-CM | POA: Insufficient documentation

## 2014-08-23 DIAGNOSIS — D509 Iron deficiency anemia, unspecified: Secondary | ICD-10-CM

## 2014-08-23 NOTE — Telephone Encounter (Signed)
refaxed inquiry to [facility]. Mailed them clinical notes today as well. I hope to here from them soon.

## 2014-08-23 NOTE — Telephone Encounter (Signed)
Pt is aware and is set for 09/08/2014

## 2014-08-23 NOTE — Telephone Encounter (Signed)
Spoke with [employer]. From [employer] about Given pre-cert. She states that a Pre-Cert is not required. Ref # [id number]

## 2014-09-08 ENCOUNTER — Ambulatory Visit (HOSPITAL_COMMUNITY)
Admission: RE | Admit: 2014-09-08 | Discharge: 2014-09-08 | Disposition: A | Discharge status: Home / Self Care | Payer: BLUE CROSS/BLUE SHIELD | Source: Ambulatory Visit | Attending: Internal Medicine | Admitting: Internal Medicine

## 2014-09-08 ENCOUNTER — Encounter (HOSPITAL_COMMUNITY)
Admission: RE | Disposition: A | Discharge status: Home / Self Care | Payer: Self-pay | Source: Ambulatory Visit | Attending: Internal Medicine

## 2014-09-08 DIAGNOSIS — D509 Iron deficiency anemia, unspecified: Secondary | ICD-10-CM | POA: Insufficient documentation

## 2014-09-08 HISTORY — PX: GIVENS CAPSULE STUDY: SHX5432

## 2014-09-08 SURGERY — IMAGING PROCEDURE, GI TRACT, INTRALUMINAL, VIA CAPSULE

## 2014-09-10 ENCOUNTER — Encounter (HOSPITAL_COMMUNITY): Payer: Self-pay | Admitting: Internal Medicine

## 2014-09-15 ENCOUNTER — Ambulatory Visit (INDEPENDENT_AMBULATORY_CARE_PROVIDER_SITE_OTHER): Payer: BLUE CROSS/BLUE SHIELD | Admitting: Family Medicine

## 2014-09-15 ENCOUNTER — Encounter: Payer: Self-pay | Admitting: Family Medicine

## 2014-09-15 VITALS — BP 138/78 | HR 72 | Resp 18 | Ht 61.0 in | Wt 189.1 lb

## 2014-09-15 DIAGNOSIS — E1159 Type 2 diabetes mellitus with other circulatory complications: Secondary | ICD-10-CM

## 2014-09-15 DIAGNOSIS — E669 Obesity, unspecified: Secondary | ICD-10-CM

## 2014-09-15 DIAGNOSIS — I10 Essential (primary) hypertension: Secondary | ICD-10-CM

## 2014-09-15 DIAGNOSIS — E1165 Type 2 diabetes mellitus with hyperglycemia: Principal | ICD-10-CM

## 2014-09-15 DIAGNOSIS — IMO0002 Reserved for concepts with insufficient information to code with codable children: Secondary | ICD-10-CM

## 2014-09-15 DIAGNOSIS — D509 Iron deficiency anemia, unspecified: Secondary | ICD-10-CM | POA: Diagnosis not present

## 2014-09-15 DIAGNOSIS — E1143 Type 2 diabetes mellitus with diabetic autonomic (poly)neuropathy: Secondary | ICD-10-CM

## 2014-09-15 DIAGNOSIS — J309 Allergic rhinitis, unspecified: Secondary | ICD-10-CM

## 2014-09-15 DIAGNOSIS — E1169 Type 2 diabetes mellitus with other specified complication: Secondary | ICD-10-CM

## 2014-09-15 DIAGNOSIS — E119 Type 2 diabetes mellitus without complications: Secondary | ICD-10-CM

## 2014-09-15 DIAGNOSIS — G990 Autonomic neuropathy in diseases classified elsewhere: Secondary | ICD-10-CM

## 2014-09-15 LAB — HEMOGLOBIN A1C
Hgb A1c MFr Bld: 7.6 % — ABNORMAL HIGH (ref <5.7)
Mean Plasma Glucose: 171 mg/dL — ABNORMAL HIGH (ref <117)

## 2014-09-15 LAB — BASIC METABOLIC PANEL WITH GFR
BUN: 18 mg/dL (ref 6–23)
CO2: 25 mEq/L (ref 19–32)
Calcium: 9.9 mg/dL (ref 8.4–10.5)
Chloride: 100 mEq/L (ref 96–112)
Creat: 0.79 mg/dL (ref 0.50–1.10)
GFR, Est African American: >89 mL/min
GFR, Est Non African American: 83 mL/min
Glucose, Bld: 68 mg/dL — ABNORMAL LOW (ref 70–99)
Potassium: 4 mEq/L (ref 3.5–5.3)
Sodium: 136 mEq/L (ref 135–145)

## 2014-09-15 LAB — TSH: TSH: 2.983 u[IU]/mL (ref 0.350–4.500)

## 2014-09-15 NOTE — Assessment & Plan Note (Signed)
Improved, pt applauded on this, no med change Michaela Peterson is reminded of the importance of commitment to daily physical activity for 30 minutes or more, as able and the need to limit carbohydrate intake to 30 to 60 grams per meal to help with blood sugar control.   The need to take medication as prescribed, test blood sugar as directed, and to call between visits if there is a concern that blood sugar is uncontrolled is also discussed.   Michaela Peterson is reminded of the importance of daily foot exam, annual eye examination, and good blood sugar, blood pressure and cholesterol control.  Diabetic Labs Latest Ref Rng 09/14/2014 06/11/2014 12/22/2013 10/19/2013 10/06/2013  HbA1c <5.7 % 7.6(H) 8.1(H) 7.8(H) - -  Microalbumin 0.00 - 1.89 mg/dL - - - 0.50 -  Micro/Creat Ratio 0.0 - 30.0 mg/g - - - 13.3 -  Chol 0 - 200 mg/dL - 187 162 - -  HDL >=46 mg/dL - 75 66 - -  Calc LDL 0 - 99 mg/dL - 101(H) 85 - -  Triglycerides <150 mg/dL - 54 55 - -  Creatinine 0.50 - 1.10 mg/dL 0.79 0.69 0.77 - 0.90   BP/Weight 09/15/2014 09/08/2014 08/13/2014 08/11/2014 07/29/2014 4/65/0354 08/15/6810  Systolic BP 751 - - - - 700 174  Diastolic BP 78 - - - - 82 70  Wt. (Lbs) 189.08 190 192 193 193.2 - 196  BMI 35.74 35.92 36.3 36.49 36.52 - 37.05   Foot/eye exam completion dates Latest Ref Rng 09/15/2014 08/12/2013  Eye Exam No Retinopathy - -  Foot Form Completion - Done Done   Updated lab needed at/ before next visit.

## 2014-09-15 NOTE — Patient Instructions (Addendum)
F/u with rectal in 4 month, call if you need me before  CONGRATS on improved blood sugar  It is important that you exercise regularly at least 30 minutes 5 times a week. If you develop chest pain, have severe difficulty breathing, or feel very tired, stop exercising immediately and seek medical attention   Fasting lipid, cmp and EGFr and hBa1C and iron and ferritin in 4 months  SLow Fe (iron) 65 mg try one daily  Please make your eye appt  Please work on good  health habits so that your health will improve. 1. Commitment to daily physical activity for 30 to 60  minutes, if you are able to do this.  2. Commitment to wise food choices. Aim for half of your  food intake to be vegetable and fruit, one quarter starchy foods, and one quarter protein. Try to eat on a regular schedule  3 meals per day, snacking between meals should be limited to vegetables or fruits or small portions of nuts. 64 ounces of water per day is generally recommended, unless you have specific health conditions, like heart failure or kidney failure where you will need to limit fluid intake.  3. Commitment to sufficient and a  good quality of physical and mental rest daily, generally between 6 to 8 hours per day.  WITH PERSISTANCE AND PERSEVERANCE, THE IMPOSSIBLE , BECOMES THE NORM!   Thanks for choosing [facility], we consider it a privelige to serve you.

## 2014-09-15 NOTE — Assessment & Plan Note (Signed)
Controlled, no change in medication DASH diet and commitment to daily physical activity for a minimum of 30 minutes discussed and encouraged, as a part of hypertension management. The importance of attaining a healthy weight is also discussed.  BP/Weight 09/15/2014 09/08/2014 08/13/2014 08/11/2014 07/29/2014 4/81/8590 11/13/1119  Systolic BP 624 - - - - 469 507  Diastolic BP 78 - - - - 82 70  Wt. (Lbs) 189.08 190 192 193 193.2 - 196  BMI 35.74 35.92 36.3 36.49 36.52 - 37.05

## 2014-09-15 NOTE — Assessment & Plan Note (Signed)
Controlled, no change in medication  

## 2014-09-15 NOTE — Progress Notes (Signed)
Michaela Peterson     MRN: [id number]      DOB: 1955/09/01   HPI Michaela Peterson is here for follow up and re-evaluation of chronic medical conditions, medication management and review of any available recent lab and radiology data.  Preventive health is updated, specifically  Cancer screening and Immunization.   Questions or concerns regarding consultations or procedures which the PT has had in the interim are  addressed. The PT denies any adverse reactions to current medications since the last visit.  There are no new concerns.  There are no specific complaints  Denies polyuria, polydipsia, blurred vision , or hypoglycemic episodes. States her experience with the dietitian was beneficial, and indeed her blood sugar has improved  ROS Denies recent fever or chills. Denies sinus pressure, nasal congestion, ear pain or sore throat. Denies chest congestion, productive cough or wheezing. Denies chest pains, palpitations and leg swelling Denies abdominal pain, nausea, vomiting,diarrhea or constipation.   Denies dysuria, frequency, hesitancy or incontinence. C/o bilateral foot pai for 1 month after attempting to walk for 30 minutes Denies headaches, seizures, numbness, or tingling. Denies depression, anxiety or insomnia. Denies skin break down or rash.   PE  BP 138/78 mmHg  Pulse 72  Resp 18  Ht 5\' 1"  (1.549 m)  Wt 189 lb 1.3 oz (85.766 kg)  BMI 35.74 kg/m2  SpO2 95%  LMP 08/18/2012  Patient alert and oriented and in no cardiopulmonary distress.  HEENT: No facial asymmetry, EOMI,   oropharynx pink and moist.  Neck supple no JVD, no mass.  Chest: Clear to auscultation bilaterally.  CVS: S1, S2 no murmurs, no S3.Regular rate.  ABD: Soft non tender.   Ext: No edema  MS: Adequate ROM spine, shoulders, hips and knees.  Skin: Intact, no ulcerations or rash noted.  Psych: Good eye contact, normal affect. Memory intact not anxious or depressed appearing.  CNS: CN 2-12 intact, power,   normal throughout.no focal deficits noted.   Assessment & Plan   Uncontrolled diabetes mellitus with peripheral autonomic neuropathy Improved, pt applauded on this, no med change Michaela Peterson is reminded of the importance of commitment to daily physical activity for 30 minutes or more, as able and the need to limit carbohydrate intake to 30 to 60 grams per meal to help with blood sugar control.   The need to take medication as prescribed, test blood sugar as directed, and to call between visits if there is a concern that blood sugar is uncontrolled is also discussed.   Michaela Peterson is reminded of the importance of daily foot exam, annual eye examination, and good blood sugar, blood pressure and cholesterol control.  Diabetic Labs Latest Ref Rng 09/14/2014 06/11/2014 12/22/2013 10/19/2013 10/06/2013  HbA1c <5.7 % 7.6(H) 8.1(H) 7.8(H) - -  Microalbumin 0.00 - 1.89 mg/dL - - - 0.50 -  Micro/Creat Ratio 0.0 - 30.0 mg/g - - - 13.3 -  Chol 0 - 200 mg/dL - 187 162 - -  HDL >=46 mg/dL - 75 66 - -  Calc LDL 0 - 99 mg/dL - 101(H) 85 - -  Triglycerides <150 mg/dL - 54 55 - -  Creatinine 0.50 - 1.10 mg/dL 0.79 0.69 0.77 - 0.90   BP/Weight 09/15/2014 09/08/2014 08/13/2014 08/11/2014 07/29/2014 3/79/0240 11/17/3530  Systolic BP 992 - - - - 426 834  Diastolic BP 78 - - - - 82 70  Wt. (Lbs) 189.08 190 192 193 193.2 - 196  BMI 35.74 35.92 36.3 36.49 36.52 -  37.05   Foot/eye exam completion dates Latest Ref Rng 09/15/2014 08/12/2013  Eye Exam No Retinopathy - -  Foot Form Completion - Done Done   Updated lab needed at/ before next visit.        Obesity, diabetes, and hypertension syndrome Unchanged. Patient re-educated about  the importance of commitment to a  minimum of 150 minutes of exercise per week.  The importance of healthy food choices with portion control discussed. Encouraged to start a food diary, count calories and to consider  joining a support group. Sample diet sheets offered. Goals set by the  patient for the next several months.   Weight /BMI 09/15/2014 09/08/2014 08/13/2014  WEIGHT 189 lb 1.3 oz 190 lb 192 lb  HEIGHT 5\' 1"  5\' 1"  5\' 1"   BMI 35.74 kg/m2 35.92 kg/m2 36.3 kg/m2    Current exercise per week 30 minutes.   Hypertension goal BP (blood pressure) < 140/90 Controlled, no change in medication DASH diet and commitment to daily physical activity for a minimum of 30 minutes discussed and encouraged, as a part of hypertension management. The importance of attaining a healthy weight is also discussed.  BP/Weight 09/15/2014 09/08/2014 08/13/2014 08/11/2014 07/29/2014 6/76/1950 11/12/2669  Systolic BP 245 - - - - 809 983  Diastolic BP 78 - - - - 82 70  Wt. (Lbs) 189.08 190 192 193 193.2 - 196  BMI 35.74 35.92 36.3 36.49 36.52 - 37.05        Allergic rhinitis Controlled, no change in medication   IDA (iron deficiency anemia) Encouraged pt to commit to once daily low iron tab, and f/u with GI as to the plan of management of her iron deficiency, recently had givens capsule study and is to hear from them

## 2014-09-15 NOTE — Assessment & Plan Note (Signed)
Encouraged pt to commit to once daily low iron tab, and f/u with GI as to the plan of management of her iron deficiency, recently had givens capsule study and is to hear from them

## 2014-09-15 NOTE — Assessment & Plan Note (Signed)
Unchanged. Patient re-educated about  the importance of commitment to a  minimum of 150 minutes of exercise per week.  The importance of healthy food choices with portion control discussed. Encouraged to start a food diary, count calories and to consider  joining a support group. Sample diet sheets offered. Goals set by the patient for the next several months.   Weight /BMI 09/15/2014 09/08/2014 08/13/2014  WEIGHT 189 lb 1.3 oz 190 lb 192 lb  HEIGHT 5\' 1"  5\' 1"  5\' 1"   BMI 35.74 kg/m2 35.92 kg/m2 36.3 kg/m2    Current exercise per week 30 minutes.

## 2014-09-17 ENCOUNTER — Telehealth: Payer: Self-pay | Admitting: Gastroenterology

## 2014-09-17 NOTE — Telephone Encounter (Signed)
Capsule study complete:  59 year old female with history of low normal ferritin, low iron, Hgb in 12 range. Colonoscopy/EGD unremarkable, and capsule study overall unremarkable. No obvious source for IDA identified. Would recommend daily supplemental iron and serial checks. Avoid NSAIDs, aspirin powders. If persistent or worsening iron studies in future would refer to hematology; however, will follow serially for now.   1. Needs to take daily iron 325 mg.  2. I believe Dr. Moshe Cipro ordered ferritin and iron at her appt on July 6th. Let's make sure patient completes.  3. Recheck every 6-8 months (CBC, iron, ferritin)

## 2014-09-17 NOTE — Op Note (Signed)
Small Bowel Givens Capsule Study Procedure date:  09/08/14  Referring Provider:  Laban Emperor, NP PCP:  Dr. Tula Nakayama, MD  Indication for procedure:   59 year old female with chronic IDA and recent endoscopy/colonoscopy without significant findings. Ferritin remains low normal at 39, iron low at 38, Hgb in 12 range. No overt signs of GI bleeding. Capsule study now indicated to exclude any small bowel etiology for IDA, as no capsule study has been completed to date.     Findings:   Capsule study complete to the cecum. No obvious mass, tumor, stricture, overt bleeding, AVMs. Unable to definitively say any areas of erosions but possible scattered areas. Overall, poor prep which precludes complete visualization of entire small bowel.   First Gastric image:  00:00:25 First Duodenal image: 00:12:04 First Cecal image: 02:31:31 Gastric Passage time: 0h 93m Small Bowel Passage time:  2h 22m  Summary & Recommendations: 59 year old female with history of low normal ferritin, low iron, Hgb in 12 range. Colonoscopy/EGD unremarkable, and capsule study overall unremarkable. No obvious source for IDA identified. Would recommend daily supplemental iron and serial checks. Avoid NSAIDs, aspirin powders. If persistent or worsening iron studies in future would refer to hematology; however, will follow serially for now.   Orvil Feil, ANP-BC [facility] Gastroenterology

## 2014-09-20 NOTE — Telephone Encounter (Signed)
Tried to call pt- NA 

## 2014-09-22 NOTE — Telephone Encounter (Signed)
Tried to call pt- phone was busy. 

## 2014-09-23 ENCOUNTER — Other Ambulatory Visit: Payer: Self-pay | Admitting: Gastroenterology

## 2014-09-23 DIAGNOSIS — D509 Iron deficiency anemia, unspecified: Secondary | ICD-10-CM

## 2014-09-23 NOTE — Telephone Encounter (Signed)
Letter mailed to the pt.  Lab orders on file.  

## 2014-09-24 ENCOUNTER — Other Ambulatory Visit: Payer: Self-pay

## 2014-09-24 MED ORDER — FLUCONAZOLE 150 MG PO TABS: 150.0000 mg | ORAL_TABLET | Freq: Once | ORAL | Status: DC

## 2014-10-21 ENCOUNTER — Ambulatory Visit: Payer: BLUE CROSS/BLUE SHIELD | Admitting: Nutrition

## 2014-11-09 ENCOUNTER — Telehealth: Payer: Self-pay | Admitting: Emergency Medicine

## 2014-11-09 NOTE — Telephone Encounter (Signed)
Notes Recorded by Collene Gobble, MD on 11/06/2014 at 9:21 PM Needs an OV to discuss her OSA and to decide whether she needs any further CT scans performed. ------ Spoke with pt. She has been scheduled for 12/03/14 at 10:30am. Nothing further was needed.

## 2014-11-16 ENCOUNTER — Other Ambulatory Visit: Payer: Self-pay | Admitting: Family Medicine

## 2014-11-30 ENCOUNTER — Other Ambulatory Visit: Payer: Self-pay | Admitting: Family Medicine

## 2014-12-03 ENCOUNTER — Encounter: Payer: Self-pay | Admitting: Emergency Medicine

## 2014-12-03 ENCOUNTER — Ambulatory Visit (INDEPENDENT_AMBULATORY_CARE_PROVIDER_SITE_OTHER): Payer: BLUE CROSS/BLUE SHIELD | Admitting: Emergency Medicine

## 2014-12-03 VITALS — BP 128/88 | HR 78 | Ht 61.0 in | Wt 187.0 lb

## 2014-12-03 DIAGNOSIS — R9389 Abnormal findings on diagnostic imaging of other specified body structures: Secondary | ICD-10-CM

## 2014-12-03 DIAGNOSIS — R938 Abnormal findings on diagnostic imaging of other specified body structures: Secondary | ICD-10-CM

## 2014-12-03 DIAGNOSIS — J4520 Mild intermittent asthma, uncomplicated: Secondary | ICD-10-CM | POA: Diagnosis not present

## 2014-12-03 DIAGNOSIS — G4733 Obstructive sleep apnea (adult) (pediatric): Secondary | ICD-10-CM | POA: Diagnosis not present

## 2014-12-03 NOTE — Patient Instructions (Signed)
Your CT scan of the chest is improved compared with 2015 Your spirometry today does not show any evidence of active asthma Please continue to keep an albuterol inhaler available to use 2 puffs if needed for shortness of breath or wheezing Follow with Dr. Moshe Cipro as planned Follow with Dr. Lamonte Sakai if you have any changes in your breathing

## 2014-12-03 NOTE — Assessment & Plan Note (Signed)
She has CPAP at home but has been unable to tolerate. she is not interested in retrying at this time

## 2014-12-03 NOTE — Assessment & Plan Note (Addendum)
With transient groundglass infiltrates that were present in August 2015 but then resolved in February 2016. This could've reflected a pneumonitis versus air trapping. She does have a sensitivity to inhaled exposures and may have asthma. She does not currently have a short acting beta agonist. I would like for her to have spirometry today and depending on the results We will coach her on rescue albuterol use. Should not need any further CT scans as she changes clinically. She will follow-up with Dr. Moshe Cipro. I asked her to see me if her albuterol use increases or if her breathing changes in any way.

## 2014-12-03 NOTE — Progress Notes (Signed)
Subjective:    Patient ID: Michaela Peterson, female    DOB: 12-14-1955, 59 y.o.   MRN: [id number]  HPI 59 yo never smoker with hx MS, DM, HTN, OSA. She had a recent CT abdomen that showed some scattered GGI without clear etiology, no clear scarring. Led to a CT chest that confirmed the same. She is referred to eval the abnormal CT scan. She has OSA but cannot wear CPAP.   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 She is sensitive to perfumes and cleansers. She gets nasal congestion and obstruction at these times. Has required BD's before. She was treated for "walking pna" in the past as well.  No mold or animal exposure except dog. No family hx lung disease or auto-immune disease.   ROV 12/03/14 -- follow-up visit for history of groundglass changes on CT scan of the chest that date back to 10/16/13, then on repeat scanning 04/19/14 the groundglass changes had resolved with no evidence of bronchiectasis or architectural distortion or interstitial disease. Also no evidence of air trapping. She has been doing well as long as she is not exposed to fumes, smokes.  She has CPAP, does not use it.    Review of Systems  Constitutional: Negative for fever and unexpected weight change.  HENT: Positive for sinus pressure. Negative for congestion, dental problem, ear pain, nosebleeds, postnasal drip, rhinorrhea, sneezing, sore throat and trouble swallowing.   Eyes: Negative for redness and itching.  Respiratory: Positive for shortness of breath. Negative for cough, chest tightness and wheezing.   Cardiovascular: Positive for chest pain, palpitations and leg swelling.  Gastrointestinal: Positive for nausea. Negative for vomiting.  Genitourinary: Negative for dysuria.  Musculoskeletal: Negative for joint swelling.  Skin: Negative for rash.  Neurological: Negative for headaches.  Hematological: Does not bruise/bleed easily.  Psychiatric/Behavioral: Negative for dysphoric mood. The patient  is not nervous/anxious.     Past Medical History  Diagnosis Date  . Diabetes mellitus, type 2     A1c of 6.9 in 05/2010  . Sleep apnea 2009  . MS (multiple sclerosis)   . GERD (gastroesophageal reflux disease)     GastroparesisS.;small hiatal hernia  . Hypertension   . Chronic low back pain   . Nonalcoholic fatty liver disease   . Obesity   . Anemia, iron deficiency   . Adnexal mass     Left; followed by Dr. [name]  . Gastroparesis   . Arthritis      Family History  Problem Relation Age of Onset  . Heart failure Mother   . Hypertension Mother   . Arthritis Mother   . Stroke Father   . Hypertension Sister   . Diabetes Sister   . Heart disease Sister   . Hypertension Son   . Colon cancer Neg Hx      Social History   Social History  . Marital Status: Married    Spouse Name: [name]   . Number of Children: 4  . Years of Education: 13   Occupational History  . unemployed     Social History Main Topics  . Smoking status: Never Smoker   . Smokeless tobacco: Never Used     Comment: patient lives with a smoker   . Alcohol Use: No  . Drug Use: No  . Sexual Activity: Yes   Other Topics Concern  . Not on file   Social History Narrative   Patient lives at home with husband [name].  Patient has 4 children.    Patient has a some college.    Patient is right handed.    Patient is currently not working.      Allergies  Allergen Reactions  . Ace Inhibitors Cough  . Aspirin     REACTION: unknown reaction  . Oxycodone-Acetaminophen Hives  . Penicillins Hives  . Statins     NAFLD     Outpatient Prescriptions Prior to Visit  Medication Sig Dispense Refill  . clotrimazole-betamethasone (LOTRISONE) cream Apply 1 application topically 2 (two) times daily. (Patient taking differently: Apply 1 application topically 2 (two) times daily as needed (feet). ) 45 g 1  . cycloSPORINE (RESTASIS) 0.05 % ophthalmic emulsion Place 1 drop into both eyes 2 (two) times daily.       . dapagliflozin propanediol (FARXIGA) 10 MG TABS tablet TAKE 1 TABLET BY MOUTH EVERY DAY AT BREAKFAST (Patient taking differently: Take 10 mg by mouth daily. TAKE 1 TABLET BY MOUTH EVERY DAY AT BREAKFAST) 30 tablet 4  . diclofenac sodium (VOLTAREN) 1 % GEL Apply 2 g topically as needed (inflammation/pain).     Marland Kitchen docusate sodium (COLACE) 50 MG capsule Take 50 mg by mouth daily as needed for mild constipation.     Marland Kitchen FIBER COMPLETE PO Take 5 mg by mouth daily.    Marland Kitchen glipiZIDE (GLUCOTROL) 10 MG tablet TAKE 1 TABLET (10 MG TOTAL) BY MOUTH 2 (TWO) TIMES DAILY BEFORE A MEAL. 180 tablet 1  . ibuprofen (ADVIL,MOTRIN) 200 MG tablet Take 200 mg by mouth every 6 (six) hours as needed for moderate pain.     Marland Kitchen loratadine (CLARITIN) 10 MG tablet Take 1 tablet (10 mg total) by mouth daily. 90 tablet 1  . metFORMIN (GLUCOPHAGE) 1000 MG tablet TAKE 1 TABLET BY MOUTH TWICE A DAY WITH FOOD 60 tablet 5  . NIFEdipine (ADALAT CC) 90 MG 24 hr tablet TAKE 1 TABLET DAILY 90 tablet 0  . Olopatadine HCl (PATANASE) 0.6 % SOLN As directed (Patient taking differently: Place 1 puff into both nostrils daily as needed (congestion). As directed) 30.5 g 2  . pantoprazole (PROTONIX) 40 MG tablet TAKE 1 TABLET BY MOUTH EVERY DAY 30 tablet 3  . Propylene Glycol (SYSTANE BALANCE OP) Apply 1 drop to eye daily.    Marland Kitchen RELION CONFIRM/MICRO TEST test strip USE STRIPS TO TEST BLOOD SUGAR TWICE DAILY 100 each 0  . triamterene-hydrochlorothiazide (MAXZIDE-25) 37.5-25 MG per tablet TAKE 1 TABLET BY MOUTH DAILY. 30 tablet 4   No facility-administered medications prior to visit.       Objective:   Physical Exam Filed Vitals:   12/03/14 1025 12/03/14 1026  BP:  128/88  Pulse:  78  Height: 5\' 1"  (1.549 m)   Weight: 187 lb (84.823 kg)   SpO2:  98%   Gen: Pleasant, well-nourished, in no distress,  normal affect  ENT: No lesions,  mouth clear,  oropharynx clear, no postnasal drip  Neck: No JVD, no TMG, no carotid bruits  Lungs: No use  of accessory muscles, no dullness to percussion, clear without rales or rhonchi  Cardiovascular: RRR, heart sounds normal, no murmur or gallops, no peripheral edema  Abdomen: soft and NT, no HSM,  BS normal  Musculoskeletal: No deformities, no cyanosis or clubbing  Neuro: alert, non focal  Skin: Warm, no lesions or rashes      10/16/13 --  COMPARISON: CT abdomen and pelvis 10/06/2013. Chest radiographs  11/15/2011.  FINDINGS:  No enlarged axillary or hilar lymph  nodes are seen. Small  mediastinal lymph nodes are noted. Heart is normal in size. There is  no pleural or pericardial effusion.  Patchy areas of predominantly peripheral ground-glass opacity are  again seen. Opacity in the medial right lower lobe and posterior  left lower lobe do not appear significantly changed. Additional  areas not previously imaged are present more superiorly in the right  lower lobe. Minimal subpleural ground-glass opacity in the right  middle lobe and lingula do not appear significantly changed. No lung  nodules are seen.  Diffusely low attenuation of the liver is compatible with moderate  steatosis. Cholecystectomy clips are partially visualized.  IMPRESSION:  Bilateral, predominantly peripheral ground-glass lung opacities,  with opacities in the lung bases unchanged from recent abdominal CT.  While these may reflect sequelae of infection, early interstitial  lung disease is also a consideration. Follow-up high-resolution  chest CT is recommended in 1 year to assess for temporal change      Assessment & Plan:  Abnormal CT scan, chest With transient groundglass infiltrates that were present in August 2015 but then resolved in February 2016. This could've reflected a pneumonitis versus air trapping. She does have a sensitivity to inhaled exposures and may have asthma. She does not currently have a short acting beta agonist. I would like for her to have spirometry today and depending on the  results We will coach her on rescue albuterol use. Should not need any further CT scans as she changes clinically. She will follow-up with Dr. Moshe Cipro. I asked her to see me if her albuterol use increases or if her breathing changes in any way.   Obstructive sleep apnea She has CPAP at home but has been unable to tolerate. she is not interested in retrying at this time   Addendum: her spirometry today shows normal airflows. This makes asthma unlikely although certainly she could still have sensitivity to inhaled substances. I'd like for her to use albuterol when necessary, follow with Dr. Moshe Cipro.   Baltazar Apo, MD, PhD 12/03/2014, 11:00 AM  Pulmonary and Critical Care [phone] or if no answer [phone]

## 2014-12-19 ENCOUNTER — Other Ambulatory Visit: Payer: Self-pay | Admitting: Family Medicine

## 2015-01-03 ENCOUNTER — Other Ambulatory Visit: Payer: Self-pay | Admitting: Family Medicine

## 2015-01-15 ENCOUNTER — Other Ambulatory Visit: Payer: Self-pay | Admitting: Family Medicine

## 2015-01-17 ENCOUNTER — Other Ambulatory Visit: Payer: Self-pay | Admitting: Family Medicine

## 2015-01-17 LAB — HEMOGLOBIN A1C
Hgb A1c MFr Bld: 7.4 % — ABNORMAL HIGH (ref <5.7)
Mean Plasma Glucose: 166 mg/dL — ABNORMAL HIGH (ref <117)

## 2015-01-18 ENCOUNTER — Encounter: Payer: Self-pay | Admitting: Family Medicine

## 2015-01-18 ENCOUNTER — Ambulatory Visit (INDEPENDENT_AMBULATORY_CARE_PROVIDER_SITE_OTHER): Payer: BLUE CROSS/BLUE SHIELD | Admitting: Family Medicine

## 2015-01-18 VITALS — BP 124/84 | HR 71 | Resp 16 | Ht 61.0 in | Wt 186.0 lb

## 2015-01-18 DIAGNOSIS — I152 Hypertension secondary to endocrine disorders: Secondary | ICD-10-CM

## 2015-01-18 DIAGNOSIS — E119 Type 2 diabetes mellitus without complications: Secondary | ICD-10-CM

## 2015-01-18 DIAGNOSIS — IMO0002 Reserved for concepts with insufficient information to code with codable children: Secondary | ICD-10-CM

## 2015-01-18 DIAGNOSIS — K921 Melena: Secondary | ICD-10-CM

## 2015-01-18 DIAGNOSIS — E1165 Type 2 diabetes mellitus with hyperglycemia: Secondary | ICD-10-CM

## 2015-01-18 DIAGNOSIS — R195 Other fecal abnormalities: Secondary | ICD-10-CM

## 2015-01-18 DIAGNOSIS — Z1211 Encounter for screening for malignant neoplasm of colon: Secondary | ICD-10-CM | POA: Diagnosis not present

## 2015-01-18 DIAGNOSIS — E1143 Type 2 diabetes mellitus with diabetic autonomic (poly)neuropathy: Secondary | ICD-10-CM

## 2015-01-18 DIAGNOSIS — Z23 Encounter for immunization: Secondary | ICD-10-CM | POA: Diagnosis not present

## 2015-01-18 DIAGNOSIS — E1169 Type 2 diabetes mellitus with other specified complication: Secondary | ICD-10-CM

## 2015-01-18 DIAGNOSIS — E669 Obesity, unspecified: Secondary | ICD-10-CM

## 2015-01-18 DIAGNOSIS — I10 Essential (primary) hypertension: Secondary | ICD-10-CM

## 2015-01-18 DIAGNOSIS — E1159 Type 2 diabetes mellitus with other circulatory complications: Secondary | ICD-10-CM

## 2015-01-18 LAB — COMPLETE METABOLIC PANEL WITH GFR
ALT: 34 U/L — ABNORMAL HIGH (ref 6–29)
AST: 21 U/L (ref 10–35)
Albumin: 4.2 g/dL (ref 3.6–5.1)
Alkaline Phosphatase: 99 U/L (ref 33–130)
BUN: 15 mg/dL (ref 7–25)
CO2: 25 mmol/L (ref 20–31)
Calcium: 9.5 mg/dL (ref 8.6–10.4)
Chloride: 101 mmol/L (ref 98–110)
Creat: 0.82 mg/dL (ref 0.50–1.05)
GFR, Est African American: >89 mL/min (ref >=60)
GFR, Est Non African American: 79 mL/min (ref >=60)
Glucose, Bld: 140 mg/dL — ABNORMAL HIGH (ref 65–99)
Potassium: 4 mmol/L (ref 3.5–5.3)
Sodium: 140 mmol/L (ref 135–146)
Total Bilirubin: 0.3 mg/dL (ref 0.2–1.2)
Total Protein: 7.6 g/dL (ref 6.1–8.1)

## 2015-01-18 LAB — LIPID PANEL
Cholesterol: 167 mg/dL (ref 125–200)
HDL: 56 mg/dL (ref >=46)
LDL Cholesterol: 85 mg/dL (ref <130)
Total CHOL/HDL Ratio: 3 Ratio (ref <=5.0)
Triglycerides: 131 mg/dL (ref <150)
VLDL: 26 mg/dL (ref <30)

## 2015-01-18 LAB — FERRITIN: Ferritin: 44 ng/mL (ref 10–291)

## 2015-01-18 LAB — POC HEMOCCULT BLD/STL (OFFICE/1-CARD/DIAGNOSTIC): Fecal Occult Blood, POC: POSITIVE — AB

## 2015-01-18 LAB — IRON: Iron: 28 ug/dL — ABNORMAL LOW (ref 45–160)

## 2015-01-18 MED ORDER — METFORMIN HCL 1000 MG PO TABS: 1000.0000 mg | ORAL_TABLET | Freq: Two times a day (BID) | ORAL | Status: DC

## 2015-01-18 MED ORDER — FLUCONAZOLE 150 MG PO TABS: ORAL_TABLET | ORAL | Status: DC

## 2015-01-18 NOTE — Progress Notes (Signed)
Subjective:    Patient ID: Michaela Peterson, female    DOB: 1955-10-23, 59 y.o.   MRN: [id number]  HPI   Michaela Peterson     MRN: [id number]      DOB: 04-07-1955   HPI [name] is here for follow up and re-evaluation of chronic medical conditions, medication management and review of any available recent lab and radiology data.  Preventive health is updated, specifically  Cancer screening and Immunization.   Questions or concerns regarding consultations or procedures which the PT has had in the interim are  addressed. The PT denies any adverse reactions to current medications since the last visit.  C/o change in recent bowel habits Denies polyuria, polydipsia, blurred vision , or hypoglycemic episodes. ROS Denies recent fever or chills. Denies sinus pressure, nasal congestion, ear pain or sore throat. Denies chest congestion, productive cough or wheezing. Denies chest pains, palpitations and leg swelling Denies abdominal pain, nausea, vomiting,diarrhea or constipation.   Denies dysuria, frequency, hesitancy or incontinence. Denies joint pain, swelling and limitation in mobility. Denies headaches, seizures, numbness, or tingling. Denies depression, anxiety or insomnia. Denies skin break down or rash.   PE  BP 124/84 mmHg  Pulse 71  Resp 16  Ht 5\' 1"  (1.549 m)  Wt 186 lb (84.369 kg)  BMI 35.16 kg/m2  SpO2 97%  LMP 08/18/2012  Patient alert and oriented and in no cardiopulmonary distress.  HEENT: No facial asymmetry, EOMI,   oropharynx pink and moist.  Neck supple no JVD, no mass.  Chest: Clear to auscultation bilaterally.  CVS: S1, S2 no murmurs, no S3.Regular rate.  ABD: Soft non tender. No organomegaly or mass. Normal bS Rectal: no mass, heme positive stool  Ext: No edema  MS: Adequate ROM spine, shoulders, hips and knees.  Skin: Intact, no ulcerations or rash noted.  Psych: Good eye contact, normal affect. Memory intact not anxious or depressed appearing.  CNS:  CN 2-12 intact, power,  normal throughout.no focal deficits noted.   Assessment & Plan   Hypertension goal BP (blood pressure) < 140/90 Controlled, no change in medication DASH diet and commitment to daily physical activity for a minimum of 30 minutes discussed and encouraged, as a part of hypertension management. The importance of attaining a healthy weight is also discussed.  BP/Weight 01/18/2015 12/03/2014 09/15/2014 09/08/2014 08/13/2014 08/11/2014 5/39/7673  Systolic BP 419 379 024 - - - -  Diastolic BP 84 88 78 - - - -  Wt. (Lbs) 186 187 189.08 190 192 193 193.2  BMI 35.16 35.35 35.74 35.92 36.3 36.49 36.52        Uncontrolled diabetes mellitus with peripheral autonomic neuropathy Improved, she is applauded on this No change in medication [name] is reminded of the importance of commitment to daily physical activity for 30 minutes or more, as able and the need to limit carbohydrate intake to 30 to 60 grams per meal to help with blood sugar control.   The need to take medication as prescribed, test blood sugar as directed, and to call between visits if there is a concern that blood sugar is uncontrolled is also discussed.   [name] is reminded of the importance of daily foot exam, annual eye examination, and good blood sugar, blood pressure and cholesterol control.  Diabetic Labs Latest Ref Rng 01/18/2015 01/17/2015 09/14/2014 06/11/2014 12/22/2013  HbA1c <5.7 % - 7.4(H) 7.6(H) 8.1(H) 7.8(H)  Microalbumin Not estab mg/dL 0.7 - - - -  Micro/Creat Ratio <30 mcg/mg  creat 5 - - - -  Chol 125 - 200 mg/dL - 167 - 187 162  HDL >=46 mg/dL - 56 - 75 66  Calc LDL <130 mg/dL - 85 - 101(H) 85  Triglycerides <150 mg/dL - 131 - 54 55  Creatinine 0.50 - 1.05 mg/dL - 0.82 0.79 0.69 0.77   BP/Weight 01/18/2015 12/03/2014 09/15/2014 09/08/2014 08/13/2014 08/11/2014 11/25/9448  Systolic BP 388 828 003 - - - -  Diastolic BP 84 88 78 - - - -  Wt. (Lbs) 186 187 189.08 190 192 193 193.2  BMI 35.16 35.35 35.74  35.92 36.3 36.49 36.52   Foot/eye exam completion dates Latest Ref Rng 09/15/2014 08/12/2013  Eye Exam No Retinopathy - -  Foot Form Completion - Done Done         Obesity, diabetes, and hypertension syndrome Improved. Patient re-educated about  the importance of commitment to a  minimum of 150 minutes of exercise per week.  The importance of healthy food choices with portion control discussed. Encouraged to start a food diary, count calories and to consider  joining a support group. Sample diet sheets offered. Goals set by the patient for the next several months.   Weight /BMI 01/18/2015 12/03/2014 09/15/2014  WEIGHT 186 lb 187 lb 189 lb 1.3 oz  HEIGHT 5\' 1"  5\' 1"  5\' 1"   BMI 35.16 kg/m2 35.35 kg/m2 35.74 kg/m2    Current exercise per week 90 minutes.        Review of Systems     Objective:   Physical Exam        Assessment & Plan:

## 2015-01-18 NOTE — Patient Instructions (Signed)
CPE end March with papa, call if you need me sooner.  Congrats on exxcellent BP, labs and weight loss , keep it up  Microalb a,d urine test today  Fluconazole #2 tabs sent for itch and additional blood test ordered  There is hidden blood in stool, so because of recent change in stool, and abdominal pain you are referred to you GI Doc  Please work on good  health habits so that your health will improve. 1. Commitment to daily physical activity for 30 to 60  minutes, if you are able to do this.  2. Commitment to wise food choices. Aim for half of your  food intake to be vegetable and fruit, one quarter starchy foods, and one quarter protein. Try to eat on a regular schedule  3 meals per day, snacking between meals should be limited to vegetables or fruits or small portions of nuts. 64 ounces of water per day is generally recommended, unless you have specific health conditions, like heart failure or kidney failure where you will need to limit fluid intake.  3. Commitment to sufficient and a  good quality of physical and mental rest daily, generally between 6 to 8 hours per day.  WITH PERSISTANCE AND PERSEVERANCE, THE IMPOSSIBLE , BECOMES THE NORM!  Thanks for choosing [facility], we consider it a privelige to serve you.

## 2015-01-19 ENCOUNTER — Encounter: Payer: Self-pay | Admitting: Internal Medicine

## 2015-01-19 LAB — MICROALBUMIN / CREATININE URINE RATIO
Creatinine, Urine: 155 mg/dL (ref 20–320)
Microalb Creat Ratio: 5 mcg/mg creat (ref <30)
Microalb, Ur: 0.7 mg/dL

## 2015-01-20 LAB — HSV 2 ANTIBODY, IGG: HSV 2 Glycoprotein G Ab, IgG: <0.1 IV

## 2015-01-29 NOTE — Assessment & Plan Note (Signed)
Improved, she is applauded on this No change in medication Michaela Peterson is reminded of the importance of commitment to daily physical activity for 30 minutes or more, as able and the need to limit carbohydrate intake to 30 to 60 grams per meal to help with blood sugar control.   The need to take medication as prescribed, test blood sugar as directed, and to call between visits if there is a concern that blood sugar is uncontrolled is also discussed.   Michaela Peterson is reminded of the importance of daily foot exam, annual eye examination, and good blood sugar, blood pressure and cholesterol control.  Diabetic Labs Latest Ref Rng 01/18/2015 01/17/2015 09/14/2014 06/11/2014 12/22/2013  HbA1c <5.7 % - 7.4(H) 7.6(H) 8.1(H) 7.8(H)  Microalbumin Not estab mg/dL 0.7 - - - -  Micro/Creat Ratio <30 mcg/mg creat 5 - - - -  Chol 125 - 200 mg/dL - 167 - 187 162  HDL >=46 mg/dL - 56 - 75 66  Calc LDL <130 mg/dL - 85 - 101(H) 85  Triglycerides <150 mg/dL - 131 - 54 55  Creatinine 0.50 - 1.05 mg/dL - 0.82 0.79 0.69 0.77   BP/Weight 01/18/2015 12/03/2014 09/15/2014 09/08/2014 08/13/2014 08/11/2014 [id number]  Systolic BP A999333 0000000 0000000 - - - -  Diastolic BP 84 88 78 - - - -  Wt. (Lbs) 186 187 189.08 190 192 193 193.2  BMI 35.16 35.35 35.74 35.92 36.3 36.49 36.52   Foot/eye exam completion dates Latest Ref Rng 09/15/2014 08/12/2013  Eye Exam No Retinopathy - -  Foot Form Completion - Done Done

## 2015-01-29 NOTE — Assessment & Plan Note (Signed)
Improved. Patient re-educated about  the importance of commitment to a  minimum of 150 minutes of exercise per week.  The importance of healthy food choices with portion control discussed. Encouraged to start a food diary, count calories and to consider  joining a support group. Sample diet sheets offered. Goals set by the patient for the next several months.   Weight /BMI 01/18/2015 12/03/2014 09/15/2014  WEIGHT 186 lb 187 lb 189 lb 1.3 oz  HEIGHT 5\' 1"  5\' 1"  5\' 1"   BMI 35.16 kg/m2 35.35 kg/m2 35.74 kg/m2    Current exercise per week 90 minutes.

## 2015-01-29 NOTE — Assessment & Plan Note (Signed)
Controlled, no change in medication DASH diet and commitment to daily physical activity for a minimum of 30 minutes discussed and encouraged, as a part of hypertension management. The importance of attaining a healthy weight is also discussed.  BP/Weight 01/18/2015 12/03/2014 09/15/2014 09/08/2014 08/13/2014 08/11/2014 [id number]  Systolic BP A999333 0000000 0000000 - - - -  Diastolic BP 84 88 78 - - - -  Wt. (Lbs) 186 187 189.08 190 192 193 193.2  BMI 35.16 35.35 35.74 35.92 36.3 36.49 36.52

## 2015-02-09 ENCOUNTER — Encounter: Payer: Self-pay | Admitting: Gastroenterology

## 2015-02-09 ENCOUNTER — Ambulatory Visit (INDEPENDENT_AMBULATORY_CARE_PROVIDER_SITE_OTHER): Payer: BLUE CROSS/BLUE SHIELD | Admitting: Gastroenterology

## 2015-02-09 VITALS — BP 139/83 | HR 74 | Temp 97.3°F | Ht 61.0 in | Wt 187.6 lb

## 2015-02-09 DIAGNOSIS — K5900 Constipation, unspecified: Secondary | ICD-10-CM | POA: Diagnosis not present

## 2015-02-09 MED ORDER — LINACLOTIDE 145 MCG PO CAPS: 145.0000 ug | ORAL_CAPSULE | Freq: Every day | ORAL | Status: DC

## 2015-02-09 NOTE — Progress Notes (Signed)
cc'ed to pcp °

## 2015-02-09 NOTE — Patient Instructions (Signed)
Start taking Linzess 1 capsule each morning, 30 minutes before breakfast. You may have a small amount of diarrhea the first few days, but this should get better. If not, call me.   We will see you back in 6 weeks!

## 2015-02-09 NOTE — Progress Notes (Signed)
Referring Provider: [name], MD Primary Care Physician:  Tula Nakayama, MD  Primary GI: Dr. [name]   Chief Complaint  Patient presents with  . Constipation  . Blood In Stools    HPI:   Michaela Peterson is a 59 y.o. female presenting today with a history of low normal ferritin, low iron, Hgb in 12 range historically. Colonoscopy/EGD/capsule unremarkable. Recently heme positive through PCP office. Low iron at 28, ferritin actually better than prior results.   Notes chronic history of constipation. Off and on. Straining recently. Hasn't seen any overt GI bleeding. Taking 325 mg iron. Taking Miralax or stool softener in order to have a BM. Corn, tomatoes sometimes irritate her stomach. If she is constipated, just feels full and doesn't want to eat. Turnip greens constipates her.    Past Medical History  Diagnosis Date  . Diabetes mellitus, type 2 (HCC)     A1c of 6.9 in 05/2010  . Sleep apnea 2009  . MS (multiple sclerosis) ([place])   . GERD (gastroesophageal reflux disease)     GastroparesisS.;small hiatal hernia  . Hypertension   . Chronic low back pain   . Nonalcoholic fatty liver disease   . Obesity   . Anemia, iron deficiency   . Adnexal mass     Left; followed by Dr. [name]  . Gastroparesis   . Arthritis     Past Surgical History  Procedure Laterality Date  . Dilation and curettage of uterus      4  . Laparoscopy abdomen diagnostic      lysis of adhesions  . Tendon lengthening      Left wrist  . Cholecystectomy    . Knee arthroscopy w/ debridement    . Rotator cuff repair    . Bladder suspension    . Cesarean section      x2   . Umbilical hernia repair    . Colonoscopy  2010    Dr. [name]: tubular adenoma, few scattered diverticula  . Esophagogastroduodenoscopy  2010    Dr. [name]: normal esophagus, small hiatal hernia, questionable pale duodenal mucosa but negative for celiac sprue.   . Colonoscopy N/A 10/01/2013    Dr. Pixie Casino preparation.  Normal rectum. Normal colonic mucosa  . Esophagogastroduodenoscopy N/A 10/01/2013    Dr. Rourk:gastric polyps-status post biopsy. Otherwise, normal EGD. fundic gland polyp, negative H.pylori  . Agile capsule N/A 08/11/2014    Procedure: AGILE CAPSULE;  Surgeon: Daneil Dolin, MD;  Location: [facility];  Service: Endoscopy;  Laterality: N/A;  0700  . [place] capsule study N/A 09/08/2014    Poor prep but overall unremarkable     Current Outpatient Prescriptions  Medication Sig Dispense Refill  . clotrimazole-betamethasone (LOTRISONE) cream Apply 1 application topically 2 (two) times daily. (Patient taking differently: Apply 1 application topically 2 (two) times daily as needed (feet). ) 45 g 1  . cycloSPORINE (RESTASIS) 0.05 % ophthalmic emulsion Place 1 drop into both eyes 2 (two) times daily.     . diclofenac sodium (VOLTAREN) 1 % GEL Apply 2 g topically as needed (inflammation/pain).     Marland Kitchen docusate sodium (COLACE) 50 MG capsule Take 50 mg by mouth daily as needed for mild constipation.     Marland Kitchen FARXIGA 10 MG TABS tablet TAKE 1 TABLET BY MOUTH EVERY DAY AT BREAKFAST 30 tablet 4  . ferrous sulfate 325 (65 FE) MG tablet Take 325 mg by mouth daily with breakfast.    . FIBER COMPLETE PO  Take 5 mg by mouth daily.    . fluconazole (DIFLUCAN) 150 MG tablet One tablet once daily for 2 days 2 tablet 0  . glipiZIDE (GLUCOTROL) 10 MG tablet TAKE 1 TABLET BY MOUTH TWICE A DAY BEFORE A MEAL 180 tablet 0  . ibuprofen (ADVIL,MOTRIN) 200 MG tablet Take 200 mg by mouth every 6 (six) hours as needed for moderate pain.     Marland Kitchen loratadine (CLARITIN) 10 MG tablet Take 1 tablet (10 mg total) by mouth daily. 90 tablet 1  . metFORMIN (GLUCOPHAGE) 1000 MG tablet Take 1 tablet (1,000 mg total) by mouth 2 (two) times daily with a meal. 180 tablet 1  . NIFEdipine (ADALAT CC) 90 MG 24 hr tablet TAKE 1 TABLET DAILY 90 tablet 0  . Olopatadine HCl (PATANASE) 0.6 % SOLN As directed (Patient taking differently: Place 1 puff into  both nostrils daily as needed (congestion). As directed) 30.5 g 2  . pantoprazole (PROTONIX) 40 MG tablet TAKE 1 TABLET BY MOUTH EVERY DAY 30 tablet 4  . Propylene Glycol (SYSTANE BALANCE OP) Apply 1 drop to eye daily.    Marland Kitchen RELION CONFIRM/MICRO TEST test strip USE STRIPS TO TEST BLOOD SUGAR TWICE DAILY 100 each 0  . triamterene-hydrochlorothiazide (MAXZIDE-25) 37.5-25 MG per tablet TAKE 1 TABLET BY MOUTH DAILY. 30 tablet 4   No current facility-administered medications for this visit.    Allergies as of 02/09/2015 - Review Complete 02/09/2015  Allergen Reaction Noted  . Ace inhibitors Cough 03/26/2012  . Aspirin    . Oxycodone-acetaminophen Hives 02/21/2007  . Penicillins Hives   . Statins  10/11/2012    Family History  Problem Relation Age of Onset  . Heart failure Mother   . Hypertension Mother   . Arthritis Mother   . Stroke Father   . Hypertension Sister   . Diabetes Sister   . Heart disease Sister   . Hypertension Son   . Colon cancer Neg Hx     Social History   Social History  . Marital Status: Married    Spouse Name: [name]   . Number of Children: 4  . Years of Education: 13   Occupational History  . unemployed     Social History Main Topics  . Smoking status: Never Smoker   . Smokeless tobacco: Never Used     Comment: patient lives with a smoker   . Alcohol Use: No  . Drug Use: No  . Sexual Activity: Yes   Other Topics Concern  . None   Social History Narrative   Patient lives at home with husband [name].    Patient has 4 children.    Patient has a some college.    Patient is right handed.    Patient is currently not working.     Review of Systems: As mentioned in HPI   Physical Exam: BP 139/83 mmHg  Pulse 74  Temp(Src) 97.3 F (36.3 C) (Oral)  Ht 5\' 1"  (1.549 m)  Wt 187 lb 9.6 oz (85.095 kg)  BMI 35.47 kg/m2  LMP 08/18/2012 General:   Alert and oriented. No distress noted. Pleasant and cooperative.  Head:  Normocephalic and  atraumatic. Eyes:  Conjuctiva clear without scleral icterus. Mouth:  Oral mucosa pink and moist. Good dentition. No lesions. Abdomen:  +BS, soft, non-tender and non-distended. No rebound or guarding. No HSM or masses noted. Msk:  Symmetrical without gross deformities. Normal posture. Extremities:  Without edema. Neurologic:  Alert and  oriented x4;  grossly normal  neurologically. Psych:  Alert and cooperative. Normal mood and affect.

## 2015-02-09 NOTE — Assessment & Plan Note (Signed)
59 year old female with history of chronic constipation, failing OTC agents of stool softeners and Miralax. As of note, recently heme positive in PCP office; however, she has had a thorough work-up last year with TCS/EGD/capsule study due to possible evolving IDA. This was all normal. Likely heme positive stool benign in setting of constipation and straining. No overt GI bleeding.   Start Linzess  145 mcg daily Return in 6 weeks

## 2015-02-12 ENCOUNTER — Other Ambulatory Visit: Payer: Self-pay | Admitting: Family Medicine

## 2015-02-15 ENCOUNTER — Other Ambulatory Visit: Payer: Self-pay

## 2015-02-15 DIAGNOSIS — D509 Iron deficiency anemia, unspecified: Secondary | ICD-10-CM

## 2015-02-22 ENCOUNTER — Other Ambulatory Visit: Payer: Self-pay | Admitting: Family Medicine

## 2015-03-01 LAB — HM DIABETES EYE EXAM

## 2015-03-08 ENCOUNTER — Telehealth: Payer: Self-pay | Admitting: Family Medicine

## 2015-03-08 ENCOUNTER — Other Ambulatory Visit: Payer: Self-pay | Admitting: Family Medicine

## 2015-03-08 MED ORDER — FLUCONAZOLE 150 MG PO TABS: ORAL_TABLET | ORAL | Status: DC

## 2015-03-08 NOTE — Telephone Encounter (Signed)
Refill sent.

## 2015-03-08 NOTE — Telephone Encounter (Signed)
Patient is requesting a refill on Diflucan please advise?

## 2015-03-16 ENCOUNTER — Encounter: Payer: Self-pay | Admitting: Family Medicine

## 2015-03-16 ENCOUNTER — Other Ambulatory Visit (HOSPITAL_COMMUNITY)
Admission: RE | Admit: 2015-03-16 | Discharge: 2015-03-16 | Disposition: A | Discharge status: Home / Self Care | Payer: BLUE CROSS/BLUE SHIELD | Source: Ambulatory Visit | Attending: Family Medicine | Admitting: Family Medicine

## 2015-03-16 ENCOUNTER — Ambulatory Visit (INDEPENDENT_AMBULATORY_CARE_PROVIDER_SITE_OTHER): Payer: BLUE CROSS/BLUE SHIELD | Admitting: Family Medicine

## 2015-03-16 VITALS — BP 134/82 | HR 62 | Resp 16 | Ht 61.0 in | Wt 185.0 lb

## 2015-03-16 DIAGNOSIS — N760 Acute vaginitis: Secondary | ICD-10-CM

## 2015-03-16 DIAGNOSIS — IMO0002 Reserved for concepts with insufficient information to code with codable children: Secondary | ICD-10-CM

## 2015-03-16 DIAGNOSIS — E1165 Type 2 diabetes mellitus with hyperglycemia: Secondary | ICD-10-CM

## 2015-03-16 DIAGNOSIS — Z113 Encounter for screening for infections with a predominantly sexual mode of transmission: Secondary | ICD-10-CM | POA: Insufficient documentation

## 2015-03-16 DIAGNOSIS — I10 Essential (primary) hypertension: Secondary | ICD-10-CM | POA: Diagnosis not present

## 2015-03-16 DIAGNOSIS — J3089 Other allergic rhinitis: Secondary | ICD-10-CM

## 2015-03-16 DIAGNOSIS — E1143 Type 2 diabetes mellitus with diabetic autonomic (poly)neuropathy: Secondary | ICD-10-CM

## 2015-03-16 DIAGNOSIS — K219 Gastro-esophageal reflux disease without esophagitis: Secondary | ICD-10-CM

## 2015-03-16 MED ORDER — MONTELUKAST SODIUM 10 MG PO TABS: 10.0000 mg | ORAL_TABLET | Freq: Every day | ORAL | Status: DC

## 2015-03-16 MED ORDER — MUPIROCIN 2 % EX OINT: 1.0000 "application " | TOPICAL_OINTMENT | Freq: Two times a day (BID) | CUTANEOUS | Status: DC

## 2015-03-16 MED ORDER — AZELASTINE HCL 0.1 % NA SOLN: 2.0000 | Freq: Two times a day (BID) | NASAL | Status: DC

## 2015-03-16 MED ORDER — PROMETHAZINE-DM 6.25-15 MG/5ML PO SYRP: 5.0000 mL | ORAL_SOLUTION | Freq: Four times a day (QID) | ORAL | Status: DC | PRN

## 2015-03-16 MED ORDER — FLUCONAZOLE 150 MG PO TABS: 150.0000 mg | ORAL_TABLET | Freq: Once | ORAL | Status: DC

## 2015-03-16 NOTE — Progress Notes (Signed)
Subjective:    Patient ID: Michaela Peterson, female    DOB: [birth year], 60 y.o.   MRN: [id number]  HPI Vaginal itch and sting, stopped farxiga 3 days ago Uncontrolled nasal congestion , clear, cough x r 3 days, had chills one day, sputum is clear Uncontrolled reflux  With heartburn , protonix Denies polyuria, polydipsia, blurred vision , or hypoglycemic episodes.    Review of Systems    See HPI Denies recent fever or chills. . Denies chest congestion, productive cough or wheezing. Denies chest pains, palpitations and leg swelling Denies abdominal pain, nausea, vomiting,diarrhea or constipation.   Denies dysuria, frequency, hesitancy or incontinence. Denies joint pain, swelling and limitation in mobility. Denies headaches, seizures, numbness, or tingling. Denies depression, anxiety or insomnia. Denies skin break down or rash.     Objective:   Physical Exam BP 134/82 mmHg  Pulse 62  Resp 16  Ht 5\' 1"  (1.549 m)  Wt 185 lb (83.915 kg)  BMI 34.97 kg/m2  SpO2 99%  LMP 08/18/2012 Patient alert and oriented and in no cardiopulmonary distress.  HEENT: No facial asymmetry, EOMI,   oropharynx pink and moist.  Neck supple no JVD, no mass. Nasal mucosa erythematous and edematous, TM clear Chest: Clear to auscultation bilaterally.  CVS: S1, S2 no murmurs, no S3.Regular rate.  ABD: Soft non tender.  Pelvic: mild erythema of vaginal canal, no ulcers, no adnexal or cervical motion tenderness, white d/c present Ext: No edema  MS: Adequate ROM spine, shoulders, hips and knees.  Skin: Intact, no ulcerations or rash noted.  Psych: Good eye contact, normal affect. Memory intact not anxious or depressed appearing.  CNS: CN 2-12 intact, power,  normal throughout.no focal deficits noted.        Assessment & Plan:  Allergic rhinitis Uncontrolled , causing excessive cough Needs to commit top daily med and cough suppressant also prescriibed  Hypertension goal BP (blood  pressure) < 140/90 Controlled, no change in medication DASH diet and commitment to daily physical activity for a minimum of 30 minutes discussed and encouraged, as a part of hypertension management. The importance of attaining a healthy weight is also discussed.  BP/Weight 03/23/2015 03/16/2015 02/09/2015 01/18/2015 12/03/2014 09/15/2014 0000000  Systolic BP 0000000 [id number] A999333 0000000 0000000 -  Diastolic BP 80 82 83 84 88 78 -  Wt. (Lbs) 187.4 185 187.6 186 187 189.08 190  BMI 35.43 34.97 35.47 35.16 35.35 35.74 35.92        Uncontrolled diabetes mellitus with peripheral autonomic neuropathy Ms. Riach is reminded of the importance of commitment to daily physical activity for 30 minutes or more, as able and the need to limit carbohydrate intake to 30 to 60 grams per meal to help with blood sugar control.   The need to take medication as prescribed, test blood sugar as directed, and to call between visits if there is a concern that blood sugar is uncontrolled is also discussed.   Ms. Schlarb is reminded of the importance of daily foot exam, annual eye examination, and good blood sugar, blood pressure and cholesterol control. Updated lab needed at/ before next visit. Pt currently off farxiga due to excess vaginalitch, encouraged her to resume and reduce carb intake  Diabetic Labs Latest Ref Rng 01/18/2015 01/17/2015 09/14/2014 06/11/2014 12/22/2013  HbA1c <5.7 % - 7.4(H) 7.6(H) 8.1(H) 7.8(H)  Microalbumin Not estab mg/dL 0.7 - - - -  Micro/Creat Ratio <30 mcg/mg creat 5 - - - -  Chol 125 - 200 mg/dL -  167 - 187 162  HDL >=46 mg/dL - 56 - 75 66  Calc LDL <130 mg/dL - 85 - 101(H) 85  Triglycerides <150 mg/dL - 131 - 54 55  Creatinine 0.50 - 1.05 mg/dL - 0.82 0.79 0.69 0.77   BP/Weight 03/23/2015 03/16/2015 02/09/2015 01/18/2015 12/03/2014 09/15/2014 0000000  Systolic BP 0000000 [id number] A999333 0000000 0000000 -  Diastolic BP 80 82 83 84 88 78 -  Wt. (Lbs) 187.4 185 187.6 186 187 189.08 190  BMI 35.43 34.97 35.47 35.16 35.35  35.74 35.92   Foot/eye exam completion dates Latest Ref Rng 03/01/2015 09/15/2014  Eye Exam No Retinopathy No Retinopathy -  Foot Form Completion - - Done         Vaginitis and vulvovaginitis Excessive itching and stinging noted, likely yeast , swabs sent and pt tested for hSV2 exposure also Fluconazole sent in prsumptively  GERD Uncontrolled , not responding to med covered by her formulary, will address further with GI

## 2015-03-16 NOTE — Patient Instructions (Addendum)
F/u as before , in March, call if you need me sooner  Swabs today and lab  For uncontrolled allergies with cough astelin , singulair daily. Cough suppressant at night as needed  I suggest holding off on refilling protonix since not working ,  Discuss with GI next week  Fluconazole 2 tablets sent for itch, we will call with other results  Bactroban ointment twice daily as needed to areas of painful skin infection, do NOT scratrch skin , this increases risk of infection!

## 2015-03-17 ENCOUNTER — Other Ambulatory Visit: Payer: Self-pay | Admitting: Family Medicine

## 2015-03-17 ENCOUNTER — Telehealth: Payer: Self-pay

## 2015-03-17 LAB — CERVICOVAGINAL ANCILLARY ONLY
Chlamydia: NEGATIVE
Neisseria Gonorrhea: NEGATIVE
Wet Prep (BD Affirm): POSITIVE — AB

## 2015-03-17 LAB — HSV 2 ANTIBODY, IGG: HSV 2 Glycoprotein G Ab, IgG: <0.1 IV

## 2015-03-17 NOTE — Telephone Encounter (Signed)
Yes, restart the farxiga  If she has not been using the patanase daily for allergies, then she needs to do so consistently. Since her allergies were uncontrolled. If using the patanase gets allergies controlled , no need to fill the  Astelin, I will remove it , let her know to call in next 1 to 2 weeks if she still has [place] allergies , then I will re send the astelin, but needs to consistently use the med she has

## 2015-03-18 ENCOUNTER — Other Ambulatory Visit: Payer: Self-pay

## 2015-03-18 MED ORDER — FLUCONAZOLE 150 MG PO TABS: 150.0000 mg | ORAL_TABLET | Freq: Once | ORAL | Status: DC

## 2015-03-18 NOTE — Telephone Encounter (Signed)
Patient aware.

## 2015-03-23 ENCOUNTER — Ambulatory Visit (INDEPENDENT_AMBULATORY_CARE_PROVIDER_SITE_OTHER): Payer: BLUE CROSS/BLUE SHIELD | Admitting: Gastroenterology

## 2015-03-23 ENCOUNTER — Encounter: Payer: Self-pay | Admitting: Gastroenterology

## 2015-03-23 VITALS — BP 125/80 | HR 65 | Temp 97.5°F | Ht 61.0 in | Wt 187.4 lb

## 2015-03-23 DIAGNOSIS — D509 Iron deficiency anemia, unspecified: Secondary | ICD-10-CM | POA: Diagnosis not present

## 2015-03-23 DIAGNOSIS — K219 Gastro-esophageal reflux disease without esophagitis: Secondary | ICD-10-CM

## 2015-03-23 MED ORDER — PANTOPRAZOLE SODIUM 40 MG PO TBEC: 40.0000 mg | DELAYED_RELEASE_TABLET | Freq: Two times a day (BID) | ORAL | Status: DC

## 2015-03-23 NOTE — Patient Instructions (Signed)
I have increased Protonix to twice a day for 30 days, then decrease back to once a day. See if you notice any pattern with the discomfort eating. We may need to do an xray.   I will see you back in 4 months. Let's go ahead and get blood work today.

## 2015-03-23 NOTE — Progress Notes (Signed)
Referring Provider: [name], MD Primary Care Physician:  Tula Nakayama, MD  Primary GI: Dr. [name]   Chief Complaint  Patient presents with  . Follow-up    HPI:   Michaela Peterson is a 60 y.o. female presenting today with a history of low normal ferritin, low iron, Hgb in 12 range historically. Colonoscopy/EGD in July 2015 and capsule unremarkable June 2016. Heme positive last year but had recently been fully evaluated as noted. Likely benign anorectal source.   Ran out of Protonix. However, prior to this, would eat and get a heavy sensation in chest, take 2 swallows of pepsi and feels better after burping. No dysphagia. No trend in foods that she eats. Unable to eat corn.    Not taking Linzess. Taking stool softeners with good results.   Past Medical History  Diagnosis Date  . Diabetes mellitus, type 2 (HCC)     A1c of 6.9 in 05/2010  . Sleep apnea 2009  . MS (multiple sclerosis) ([place])   . GERD (gastroesophageal reflux disease)     GastroparesisS.;small hiatal hernia  . Hypertension   . Chronic low back pain   . Nonalcoholic fatty liver disease   . Obesity   . Anemia, iron deficiency   . Adnexal mass     Left; followed by Dr. [name]  . Gastroparesis   . Arthritis     Past Surgical History  Procedure Laterality Date  . Dilation and curettage of uterus      4  . Laparoscopy abdomen diagnostic      lysis of adhesions  . Tendon lengthening      Left wrist  . Cholecystectomy    . Knee arthroscopy w/ debridement    . Rotator cuff repair    . Bladder suspension    . Cesarean section      x2   . Umbilical hernia repair    . Colonoscopy  2010    Dr. [name]: tubular adenoma, few scattered diverticula  . Esophagogastroduodenoscopy  2010    Dr. [name]: normal esophagus, small hiatal hernia, questionable pale duodenal mucosa but negative for celiac sprue.   . Colonoscopy N/A 10/01/2013    Dr. Pixie Casino preparation. Normal rectum. Normal colonic mucosa    . Esophagogastroduodenoscopy N/A 10/01/2013    Dr. Rourk:gastric polyps-status post biopsy. Otherwise, normal EGD. fundic gland polyp, negative H.pylori  . Agile capsule N/A 08/11/2014    Procedure: AGILE CAPSULE;  Surgeon: Daneil Dolin, MD;  Location: [facility];  Service: Endoscopy;  Laterality: N/A;  0700  . [place]ivens capsule study N/A 09/08/2014    Poor prep but overall unremarkable     Current Outpatient Prescriptions  Medication Sig Dispense Refill  . clotrimazole-betamethasone (LOTRISONE) cream Apply 1 application topically 2 (two) times daily. (Patient taking differently: Apply 1 application topically 2 (two) times daily as needed (feet). ) 45 g 1  . cycloSPORINE (RESTASIS) 0.05 % ophthalmic emulsion Place 1 drop into both eyes 2 (two) times daily.     . diclofenac sodium (VOLTAREN) 1 % GEL Apply 2 g topically as needed (inflammation/pain). Reported on 03/16/2015    . docusate sodium (COLACE) 50 MG capsule Take 50 mg by mouth daily as needed for mild constipation.     Marland Kitchen FARXIGA 10 MG TABS tablet TAKE 1 TABLET BY MOUTH EVERY DAY AT BREAKFAST 30 tablet 4  . ferrous sulfate 325 (65 FE) MG tablet Take 325 mg by mouth daily with breakfast.    .  fluconazole (DIFLUCAN) 150 MG tablet Take 1 tablet (150 mg total) by mouth once. 2 tablet 2  . glipiZIDE (GLUCOTROL) 10 MG tablet TAKE 1 TABLET BY MOUTH TWICE A DAY BEFORE A MEAL 180 tablet 0  . ibuprofen (ADVIL,MOTRIN) 200 MG tablet Take 200 mg by mouth every 6 (six) hours as needed for moderate pain.     . metFORMIN (GLUCOPHAGE) 1000 MG tablet Take 1 tablet (1,000 mg total) by mouth 2 (two) times daily with a meal. 180 tablet 1  . montelukast (SINGULAIR) 10 MG tablet Take 1 tablet (10 mg total) by mouth at bedtime. 30 tablet 3  . mupirocin ointment (BACTROBAN) 2 % Apply 1 application topically 2 (two) times daily. 30 g 1  . NIFEdipine (ADALAT CC) 90 MG 24 hr tablet TAKE 1 TABLET DAILY 90 tablet 0  . Olopatadine HCl (PATANASE) 0.6 % SOLN As directed  (Patient taking differently: Place 1 puff into both nostrils daily as needed (congestion). As directed) 30.5 g 2  . pantoprazole (PROTONIX) 40 MG tablet TAKE 1 TABLET BY MOUTH EVERY DAY 30 tablet 4  . promethazine-dextromethorphan (PROMETHAZINE-DM) 6.25-15 MG/5ML syrup Take 5 mLs by mouth 4 (four) times daily as needed for cough. 118 mL 0  . Propylene Glycol (SYSTANE BALANCE OP) Apply 1 drop to eye daily.    Marland Kitchen RELION CONFIRM/MICRO TEST test strip USE STRIPS TO TEST BLOOD SUGAR TWICE DAILY 100 each 0  . triamterene-hydrochlorothiazide (MAXZIDE-25) 37.5-25 MG per tablet TAKE 1 TABLET BY MOUTH DAILY. 30 tablet 4   No current facility-administered medications for this visit.    Allergies as of 03/23/2015 - Review Complete 03/23/2015  Allergen Reaction Noted  . Ace inhibitors Cough 03/26/2012  . Aspirin    . Oxycodone-acetaminophen Hives 02/21/2007  . Penicillins Hives   . Statins  10/11/2012    Family History  Problem Relation Age of Onset  . Heart failure Mother   . Hypertension Mother   . Arthritis Mother   . Stroke Father   . Hypertension Sister   . Diabetes Sister   . Heart disease Sister   . Hypertension Son   . Colon cancer Neg Hx     Social History   Social History  . Marital Status: Married    Spouse Name: [name]   . Number of Children: 4  . Years of Education: 13   Occupational History  . unemployed     Social History Main Topics  . Smoking status: Never Smoker   . Smokeless tobacco: Never Used     Comment: patient lives with a smoker   . Alcohol Use: No  . Drug Use: No  . Sexual Activity: Yes   Other Topics Concern  . None   Social History Narrative   Patient lives at home with husband [name].    Patient has 4 children.    Patient has a some college.    Patient is right handed.    Patient is currently not working.     Review of Systems: As noted in HPI   Physical Exam: BP 125/80 mmHg  Pulse 65  Temp(Src) 97.5 F (36.4 C) (Oral)  Ht 5\' 1"  (1.549  m)  Wt 187 lb 6.4 oz (85.004 kg)  BMI 35.43 kg/m2  LMP 08/18/2012 General:   Alert and oriented. No distress noted. Pleasant and cooperative.  Head:  Normocephalic and atraumatic. Abdomen:  +BS, soft, non-tender and non-distended. No rebound or guarding. No HSM or masses noted. Msk:  Symmetrical without gross deformities.  Normal posture. Extremities:  Without edema. Neurologic:  Alert and  oriented x4;  grossly normal neurologically. Psych:  Alert and cooperative. Normal mood and affect.

## 2015-03-24 LAB — CBC WITH DIFFERENTIAL/PLATELET
Basophils Absolute: 0.1 10*3/uL (ref 0.0–0.1)
Basophils Relative: 1 % (ref 0–1)
Eosinophils Absolute: 0.1 10*3/uL (ref 0.0–0.7)
Eosinophils Relative: 1 % (ref 0–5)
HCT: 39 % (ref 36.0–46.0)
Hemoglobin: 12.8 g/dL (ref 12.0–15.0)
Lymphocytes Relative: 41 % (ref 12–46)
Lymphs Abs: 3.5 10*3/uL (ref 0.7–4.0)
MCH: 27.2 pg (ref 26.0–34.0)
MCHC: 32.8 g/dL (ref 30.0–36.0)
MCV: 83 fL (ref 78.0–100.0)
MPV: 9.4 fL (ref 8.6–12.4)
Monocytes Absolute: 0.4 10*3/uL (ref 0.1–1.0)
Monocytes Relative: 5 % (ref 3–12)
Neutro Abs: 4.4 10*3/uL (ref 1.7–7.7)
Neutrophils Relative %: 52 % (ref 43–77)
Platelets: 425 10*3/uL — ABNORMAL HIGH (ref 150–400)
RBC: 4.7 MIL/uL (ref 3.87–5.11)
RDW: 15.6 % — ABNORMAL HIGH (ref 11.5–15.5)
WBC: 8.5 10*3/uL (ref 4.0–10.5)

## 2015-03-25 LAB — FERRITIN: Ferritin: 63 ng/mL (ref 10–291)

## 2015-03-25 LAB — IRON: Iron: 48 ug/dL (ref 45–160)

## 2015-03-29 NOTE — Assessment & Plan Note (Signed)
Thorough work-up 2015/2016. Check CBC, iron, ferritin now.

## 2015-03-29 NOTE — Progress Notes (Signed)
CC'ED TO PCP 

## 2015-03-29 NOTE — Assessment & Plan Note (Signed)
Symptomatic off PPI. However, symptoms of belching were noted prior to coming off of Protonix. Increase to BID for one month and then back to once daily. Dietary and behavior modifications discussed. Return in 4 months. If no improvement in symptoms or symptoms of dysphagia, proceed with BPE.

## 2015-04-02 DIAGNOSIS — N760 Acute vaginitis: Secondary | ICD-10-CM | POA: Insufficient documentation

## 2015-04-02 NOTE — Assessment & Plan Note (Signed)
Uncontrolled , not responding to med covered by her formulary, will address further with GI

## 2015-04-02 NOTE — Assessment & Plan Note (Signed)
Excessive itching and stinging noted, likely yeast , swabs sent and pt tested for hSV2 exposure also Fluconazole sent in prsumptively

## 2015-04-02 NOTE — Assessment & Plan Note (Signed)
Michaela Peterson is reminded of the importance of commitment to daily physical activity for 30 minutes or more, as able and the need to limit carbohydrate intake to 30 to 60 grams per meal to help with blood sugar control.   The need to take medication as prescribed, test blood sugar as directed, and to call between visits if there is a concern that blood sugar is uncontrolled is also discussed.   Michaela Peterson is reminded of the importance of daily foot exam, annual eye examination, and good blood sugar, blood pressure and cholesterol control. Updated lab needed at/ before next visit. Pt currently off farxiga due to excess vaginalitch, encouraged her to resume and reduce carb intake  Diabetic Labs Latest Ref Rng 01/18/2015 01/17/2015 09/14/2014 06/11/2014 12/22/2013  HbA1c <5.7 % - 7.4(H) 7.6(H) 8.1(H) 7.8(H)  Microalbumin Not estab mg/dL 0.7 - - - -  Micro/Creat Ratio <30 mcg/mg creat 5 - - - -  Chol 125 - 200 mg/dL - 167 - 187 162  HDL >=46 mg/dL - 56 - 75 66  Calc LDL <130 mg/dL - 85 - 101(H) 85  Triglycerides <150 mg/dL - 131 - 54 55  Creatinine 0.50 - 1.05 mg/dL - 0.82 0.79 0.69 0.77   BP/Weight 03/23/2015 03/16/2015 02/09/2015 01/18/2015 12/03/2014 09/15/2014 0000000  Systolic BP 0000000 [id number] [id number] 0000000 0000000 -  Diastolic BP 80 82 83 84 88 78 -  Wt. (Lbs) 187.4 185 187.6 186 187 189.08 190  BMI 35.43 34.97 35.47 35.16 35.35 35.74 35.92   Foot/eye exam completion dates Latest Ref Rng 03/01/2015 09/15/2014  Eye Exam No Retinopathy No Retinopathy -  Foot Form Completion - - Done

## 2015-04-02 NOTE — Assessment & Plan Note (Signed)
Controlled, no change in medication DASH diet and commitment to daily physical activity for a minimum of 30 minutes discussed and encouraged, as a part of hypertension management. The importance of attaining a healthy weight is also discussed.  BP/Weight 03/23/2015 03/16/2015 02/09/2015 01/18/2015 12/03/2014 09/15/2014 0000000  Systolic BP 0000000 [id number] [id number] 0000000 0000000 -  Diastolic BP 80 82 83 84 88 78 -  Wt. (Lbs) 187.4 185 187.6 186 187 189.08 190  BMI 35.43 34.97 35.47 35.16 35.35 35.74 35.92

## 2015-04-02 NOTE — Assessment & Plan Note (Signed)
Uncontrolled , causing excessive cough Needs to commit top daily med and cough suppressant also prescriibed

## 2015-04-04 NOTE — Progress Notes (Signed)
Quick Note:  Iron and ferritin both improved. Hgb stable. She has chronic thrombocytosis. She may need hematology evaluation for this. Let's repeat CBC in 6 weeks. If persistent, we need to refer to hematology. ______

## 2015-04-06 ENCOUNTER — Other Ambulatory Visit: Payer: Self-pay | Admitting: Gastroenterology

## 2015-04-06 DIAGNOSIS — D509 Iron deficiency anemia, unspecified: Secondary | ICD-10-CM

## 2015-04-12 ENCOUNTER — Telehealth: Payer: Self-pay | Admitting: Family Medicine

## 2015-04-12 NOTE — Telephone Encounter (Addendum)
pls order HBA1C, chem 7 anfd EGFR and HBA1C and cBCand diff to be done Feb 7 Explain 10 months ago platelet was slightly above normal , and much lower than 3 years ago, all other bone marrow tests were norma If this platelet/ blood test is abnormal , I will refer her to hematology, NEEDS to get lab next TUESDAY

## 2015-04-12 NOTE — Telephone Encounter (Signed)
Wants to know if it will be ok to take biotin 10,000mg  daily.  Also said the GI dr told her that her platelets were high and had been high for a while and may have to send her to hematology. Said she had never heard anything about this from her labs here. Please advise

## 2015-04-12 NOTE — Telephone Encounter (Signed)
Michaela Peterson is asking for a returned call from the nurse, she has questions regarding her medications, please advise?

## 2015-04-13 NOTE — Telephone Encounter (Signed)
Patient wants to wait and do Laban Emperor recommendation and recheck in 6 weeks and have referral placed at that time if platelets are still elevated

## 2015-04-23 ENCOUNTER — Other Ambulatory Visit: Payer: Self-pay | Admitting: Family Medicine

## 2015-04-25 ENCOUNTER — Other Ambulatory Visit: Payer: Self-pay

## 2015-04-25 DIAGNOSIS — D509 Iron deficiency anemia, unspecified: Secondary | ICD-10-CM

## 2015-05-16 LAB — CBC WITH DIFFERENTIAL/PLATELET
Basophils Absolute: 0.1 10*3/uL (ref 0.0–0.1)
Basophils Relative: 1 % (ref 0–1)
Eosinophils Absolute: 0 10*3/uL (ref 0.0–0.7)
Eosinophils Relative: 0 % (ref 0–5)
HCT: 37.1 % (ref 36.0–46.0)
Hemoglobin: 12.8 g/dL (ref 12.0–15.0)
Lymphocytes Relative: 41 % (ref 12–46)
Lymphs Abs: 2.8 10*3/uL (ref 0.7–4.0)
MCH: 28.3 pg (ref 26.0–34.0)
MCHC: 34.5 g/dL (ref 30.0–36.0)
MCV: 81.9 fL (ref 78.0–100.0)
MPV: 9.3 fL (ref 8.6–12.4)
Monocytes Absolute: 0.3 10*3/uL (ref 0.1–1.0)
Monocytes Relative: 4 % (ref 3–12)
Neutro Abs: 3.7 10*3/uL (ref 1.7–7.7)
Neutrophils Relative %: 54 % (ref 43–77)
Platelets: 415 10*3/uL — ABNORMAL HIGH (ref 150–400)
RBC: 4.53 MIL/uL (ref 3.87–5.11)
RDW: 15.6 % — ABNORMAL HIGH (ref 11.5–15.5)
WBC: 6.9 10*3/uL (ref 4.0–10.5)

## 2015-05-20 ENCOUNTER — Telehealth: Payer: Self-pay | Admitting: Family Medicine

## 2015-05-20 NOTE — Telephone Encounter (Signed)
Yes  , pls let her know!

## 2015-05-20 NOTE — Telephone Encounter (Signed)
Patient is asking is she would benefit from doing a [facility] Screening that is coming to [place] on April 11th at the [facility] ([facility]), please advise?

## 2015-05-20 NOTE — Telephone Encounter (Signed)
Patient aware.

## 2015-05-25 ENCOUNTER — Other Ambulatory Visit: Payer: Self-pay | Admitting: Family Medicine

## 2015-06-07 NOTE — Progress Notes (Signed)
Quick Note:  Persistent mildly elevated thrombocytosis. Let's refer to hematology. ______

## 2015-06-08 ENCOUNTER — Ambulatory Visit (INDEPENDENT_AMBULATORY_CARE_PROVIDER_SITE_OTHER): Payer: BLUE CROSS/BLUE SHIELD | Admitting: Family Medicine

## 2015-06-08 ENCOUNTER — Encounter: Payer: Self-pay | Admitting: Family Medicine

## 2015-06-08 ENCOUNTER — Other Ambulatory Visit (HOSPITAL_COMMUNITY)
Admission: RE | Admit: 2015-06-08 | Discharge: 2015-06-08 | Disposition: A | Discharge status: Home / Self Care | Payer: BLUE CROSS/BLUE SHIELD | Source: Ambulatory Visit | Attending: Family Medicine | Admitting: Family Medicine

## 2015-06-08 VITALS — BP 140/80 | HR 60 | Resp 18 | Ht 61.0 in | Wt 184.0 lb

## 2015-06-08 DIAGNOSIS — Z01419 Encounter for gynecological examination (general) (routine) without abnormal findings: Secondary | ICD-10-CM | POA: Diagnosis present

## 2015-06-08 DIAGNOSIS — IMO0002 Reserved for concepts with insufficient information to code with codable children: Secondary | ICD-10-CM

## 2015-06-08 DIAGNOSIS — R21 Rash and other nonspecific skin eruption: Secondary | ICD-10-CM | POA: Diagnosis not present

## 2015-06-08 DIAGNOSIS — I10 Essential (primary) hypertension: Secondary | ICD-10-CM

## 2015-06-08 DIAGNOSIS — Z1151 Encounter for screening for human papillomavirus (HPV): Secondary | ICD-10-CM | POA: Insufficient documentation

## 2015-06-08 DIAGNOSIS — E1143 Type 2 diabetes mellitus with diabetic autonomic (poly)neuropathy: Secondary | ICD-10-CM

## 2015-06-08 DIAGNOSIS — Z124 Encounter for screening for malignant neoplasm of cervix: Secondary | ICD-10-CM

## 2015-06-08 DIAGNOSIS — Z1211 Encounter for screening for malignant neoplasm of colon: Secondary | ICD-10-CM

## 2015-06-08 DIAGNOSIS — Z0000 Encounter for general adult medical examination without abnormal findings: Secondary | ICD-10-CM | POA: Insufficient documentation

## 2015-06-08 DIAGNOSIS — R350 Frequency of micturition: Secondary | ICD-10-CM | POA: Diagnosis not present

## 2015-06-08 DIAGNOSIS — Z1159 Encounter for screening for other viral diseases: Secondary | ICD-10-CM

## 2015-06-08 DIAGNOSIS — D75839 Thrombocytosis, unspecified: Secondary | ICD-10-CM

## 2015-06-08 DIAGNOSIS — D473 Essential (hemorrhagic) thrombocythemia: Secondary | ICD-10-CM | POA: Diagnosis not present

## 2015-06-08 DIAGNOSIS — E1165 Type 2 diabetes mellitus with hyperglycemia: Secondary | ICD-10-CM

## 2015-06-08 LAB — LIPID PANEL
Cholesterol: 175 mg/dL (ref 125–200)
HDL: 65 mg/dL (ref >=46)
LDL Cholesterol: 94 mg/dL (ref <130)
Total CHOL/HDL Ratio: 2.7 Ratio (ref <=5.0)
Triglycerides: 79 mg/dL (ref <150)
VLDL: 16 mg/dL (ref <30)

## 2015-06-08 LAB — POCT URINALYSIS DIPSTICK
Bilirubin, UA: NEGATIVE
Blood, UA: NEGATIVE
Glucose, UA: 500
Leukocytes, UA: NEGATIVE
Nitrite, UA: NEGATIVE
Protein, UA: NEGATIVE
Spec Grav, UA: 1.02
Urobilinogen, UA: 0.2
pH, UA: 5

## 2015-06-08 LAB — COMPLETE METABOLIC PANEL WITH GFR
ALT: 38 U/L — ABNORMAL HIGH (ref 6–29)
AST: 28 U/L (ref 10–35)
Albumin: 4.5 g/dL (ref 3.6–5.1)
Alkaline Phosphatase: 88 U/L (ref 33–130)
BUN: 18 mg/dL (ref 7–25)
CO2: 26 mmol/L (ref 20–31)
Calcium: 9.9 mg/dL (ref 8.6–10.4)
Chloride: 103 mmol/L (ref 98–110)
Creat: 0.91 mg/dL (ref 0.50–1.05)
GFR, Est African American: 80 mL/min (ref >=60)
GFR, Est Non African American: 69 mL/min (ref >=60)
Glucose, Bld: 95 mg/dL (ref 65–99)
Potassium: 4.2 mmol/L (ref 3.5–5.3)
Sodium: 142 mmol/L (ref 135–146)
Total Bilirubin: 0.5 mg/dL (ref 0.2–1.2)
Total Protein: 7.6 g/dL (ref 6.1–8.1)

## 2015-06-08 LAB — POC HEMOCCULT BLD/STL (OFFICE/1-CARD/DIAGNOSTIC): Fecal Occult Blood, POC: NEGATIVE

## 2015-06-08 NOTE — Patient Instructions (Signed)
F/u in 4.5 mon, call iof you need me before  You are referred to dermatologist and also to Blood specialist, re rash and slight elevated platelet count (I will let Dr Sydell Axon know)  You need labs today and urine is being checked also.   Please work on good  health habits so that your health will improve. 1. Commitment to daily physical activity for 30 to 60  minutes, if you are able to do this.  2. Commitment to wise food choices. Aim for half of your  food intake to be vegetable and fruit, one quarter starchy foods, and one quarter protein. Try to eat on a regular schedule  3 meals per day, snacking between meals should be limited to vegetables or fruits or small portions of nuts. 64 ounces of water per day is generally recommended, unless you have specific health conditions, like heart failure or kidney failure where you will need to limit fluid intake.  3. Commitment to sufficient and a  good quality of physical and mental rest daily, generally between 6 to 8 hours per day.  WITH PERSISTANCE AND PERSEVERANCE, THE IMPOSSIBLE , BECOMES THE NORM! Thanks for choosing [facility], we consider it a privelige to serve you.

## 2015-06-08 NOTE — Assessment & Plan Note (Signed)

## 2015-06-08 NOTE — Progress Notes (Signed)
   Subjective:    Patient ID: Michaela Peterson, female    DOB: [birth year], 60 y.o.   MRN: [id number]  HPI Patient is in for annual physical exam. C/o recurrent rash on face and in scalp , wants more help with this  Rept ;labs by GI show chronic mild thrombocytosis, will refer to hematology , as discussion  Had been started by GI and pt wants follow through, will notify her GI Doc that referral completed. Recent labs, if available are reviewed. Immunization is reviewed , and  updated if needed.    Review of Systems See HPI     Objective:   Physical Exam BP 140/80 mmHg  Pulse 60  Resp 18  Ht 5\' 1"  (1.549 m)  Wt 184 lb 0.6 oz (83.48 kg)  BMI 34.79 kg/m2  SpO2 99%  LMP 08/18/2012 Pleasant well nourished female, alert and oriented x 3, in no cardio-pulmonary distress. Afebrile. HEENT No facial trauma or asymetry. Sinuses non tender.  Extra occullar muscles intact, pupils equally reactive to light. External ears normal, tympanic membranes clear. Oropharynx moist, no exudate, fairly good dentition. Neck: supple, no adenopathy,JVD or thyromegaly.No bruits.  Chest: Clear to ascultation bilaterally.No crackles or wheezes. Non tender to palpation  Breast: No asymetry,no masses or lumps. No tenderness. No nipple discharge or inversion. No axillary or supraclavicular adenopathy  Cardiovascular system; Heart sounds normal,  S1 and  S2 ,no S3.  No murmur, or thrill. Apical beat not displaced Peripheral pulses normal.  Abdomen: Soft, non tender, no organomegaly or masses. No bruits. Bowel sounds normal. No guarding, tenderness or rebound.  Rectal:  Normal sphincter tone. No mass.No rectal masses.  Guaiac negative stool.  GU: External genitalia normal female genitalia , female distribution of hair. No lesions. Urethral meatus normal in size, no  Prolapse, no lesions visibly  Present. Bladder non tender. Vagina pink and moist , with no visible lesions , discharge present  . Adequate pelvic support no  cystocele or rectocele noted Cervix pink and appears healthy, no lesions or ulcerations noted, no discharge noted from os Uterus normal size, no adnexal masses, no cervical motion or adnexal tenderness.   Musculoskeletal exam: Full ROM of spine, hips , shoulders and knees. No deformity ,swelling or crepitus noted. No muscle wasting or atrophy.   Neurologic: Cranial nerves 2 to 12 intact. Power, tone ,sensation and reflexes normal throughout. No disturbance in gait. No tremor.  Skin: Intact, erythematous macular rash on cheeks , and patchy alopecia and erythema on scalp No ulcers, no purulent drainage  Psych; Normal mood and affect. Judgement and concentration normal        Assessment & Plan:  Encounter for annual physical exam Annual exam as documented. Counseling done  re healthy lifestyle involving commitment to 150 minutes exercise per week, heart healthy diet, and attaining healthy weight.The importance of adequate sleep also discussed. Regular seat belt use and home safety, is also discussed. Changes in health habits are decided on by the patient with goals and time frames  set for achieving them. Immunization and cancer screening needs are specifically addressed at this visit.   Rash and nonspecific skin eruption recurerent pruritic rash on face and scalp , dermatology to eval  Thrombocytosis (HCC) Chronic mild thrombocytosis, refer for hematology evaluation, will alert GI to fact that referral completed

## 2015-06-08 NOTE — Assessment & Plan Note (Signed)
recurerent pruritic rash on face and scalp , dermatology to eval

## 2015-06-09 LAB — HIV ANTIBODY (ROUTINE TESTING W REFLEX): HIV 1&2 Ab, 4th Generation: NONREACTIVE

## 2015-06-09 LAB — HEMOGLOBIN A1C
Hgb A1c MFr Bld: 7.6 % — ABNORMAL HIGH (ref <5.7)
Mean Plasma Glucose: 171 mg/dL

## 2015-06-10 LAB — CYTOLOGY - PAP

## 2015-06-11 NOTE — Assessment & Plan Note (Signed)
Chronic mild thrombocytosis, refer for hematology evaluation, will alert GI to fact that referral completed

## 2015-06-13 ENCOUNTER — Other Ambulatory Visit: Payer: Self-pay | Admitting: Family Medicine

## 2015-07-15 ENCOUNTER — Other Ambulatory Visit: Payer: Self-pay | Admitting: Family Medicine

## 2015-07-15 DIAGNOSIS — Z1231 Encounter for screening mammogram for malignant neoplasm of breast: Secondary | ICD-10-CM

## 2015-07-21 ENCOUNTER — Encounter: Payer: Self-pay | Admitting: Gastroenterology

## 2015-07-21 ENCOUNTER — Ambulatory Visit: Payer: BLUE CROSS/BLUE SHIELD | Admitting: Gastroenterology

## 2015-07-21 ENCOUNTER — Ambulatory Visit (INDEPENDENT_AMBULATORY_CARE_PROVIDER_SITE_OTHER): Payer: BLUE CROSS/BLUE SHIELD | Admitting: Gastroenterology

## 2015-07-21 VITALS — BP 133/78 | HR 70 | Temp 97.0°F | Ht 61.0 in | Wt 179.2 lb

## 2015-07-21 DIAGNOSIS — K219 Gastro-esophageal reflux disease without esophagitis: Secondary | ICD-10-CM

## 2015-07-21 DIAGNOSIS — K5900 Constipation, unspecified: Secondary | ICD-10-CM

## 2015-07-21 DIAGNOSIS — K3184 Gastroparesis: Secondary | ICD-10-CM | POA: Diagnosis not present

## 2015-07-21 MED ORDER — ONDANSETRON HCL 4 MG PO TABS: 4.0000 mg | ORAL_TABLET | Freq: Three times a day (TID) | ORAL | Status: DC

## 2015-07-21 NOTE — Patient Instructions (Signed)
Start taking Protonix in the morning, 30 minutes before breakfast.   Take Miralax every evening to every other evening.   I have sent in a nausea medication to your pharmacy to take as needed.   We will see you in 6 weeks!

## 2015-07-21 NOTE — Progress Notes (Signed)
Referring Provider: [name], MD Primary Care Physician:  Tula Nakayama, MD  Primary GI: Dr. [name]   Chief Complaint  Patient presents with  . Follow-up    HPI:   Michaela Peterson is a 60 y.o. female presenting today with a history of low normal ferritin, low iron, Hgb in 12 range historically. Colonoscopy/EGD in July 2015 and capsule unremarkable June 2016. Recently heme negative (March 2017). Thrombocytosis noted chronically, and she has been referred to Hematology.    Has had a little bit of discomfort in epigastric region, points to xiphoid process. For a few months. Drinking ginger ale. Burps and gets some relief. Will eat some and feels like she needs to throw up. States she is under some considerable stress. Saw dermatologist because she has been having bumps in her scalp and face "coming up". Prescribed an antibiotic. On Protonix once a day. Rarely taking Ibuprofen unless hurting very bad. When doesn't have a good BM one day, can only eat a little bit the next day. After having a BM, she can eat better. Uses a stool softener. Has Linzess at home but hasn't ever taken it. Does not want to take this now.  Not exercising. Purposeful weight loss. Heme negative recently. Drinks a small amount of water or ginger ale at night and then lays down, feels it come up. Wonders if she is dehydrated. Not getting enough water in. Has been taking Protonix at night.   A1c 7.6 in March 2017.    Past Medical History  Diagnosis Date  . Diabetes mellitus, type 2 (HCC)     A1c of 6.9 in 05/2010  . Sleep apnea 2009  . MS (multiple sclerosis) ([place])   . GERD (gastroesophageal reflux disease)     GastroparesisS.;small hiatal hernia  . Hypertension   . Chronic low back pain   . Nonalcoholic fatty liver disease   . Obesity   . Anemia, iron deficiency   . Adnexal mass     Left; followed by Dr. [name]  . Gastroparesis   . Arthritis     Past Surgical History  Procedure Laterality Date   . Dilation and curettage of uterus      4  . Laparoscopy abdomen diagnostic      lysis of adhesions  . Tendon lengthening      Left wrist  . Cholecystectomy    . Knee arthroscopy w/ debridement    . Rotator cuff repair    . Bladder suspension    . Cesarean section      x2   . Umbilical hernia repair    . Colonoscopy  2010    Dr. [name]: tubular adenoma, few scattered diverticula  . Esophagogastroduodenoscopy  2010    Dr. [name]: normal esophagus, small hiatal hernia, questionable pale duodenal mucosa but negative for celiac sprue.   . Colonoscopy N/A 10/01/2013    Dr. Pixie Casino preparation. Normal rectum. Normal colonic mucosa  . Esophagogastroduodenoscopy N/A 10/01/2013    Dr. Rourk:gastric polyps-status post biopsy. Otherwise, normal EGD. fundic gland polyp, negative H.pylori  . Agile capsule N/A 08/11/2014    Procedure: AGILE CAPSULE;  Surgeon: Daneil Dolin, MD;  Location: [facility];  Service: Endoscopy;  Laterality: N/A;  0700  . [place] capsule study N/A 09/08/2014    Poor prep but overall unremarkable     Current Outpatient Prescriptions  Medication Sig Dispense Refill  . clotrimazole-betamethasone (LOTRISONE) cream Apply 1 application topically 2 (two) times daily. (Patient taking differently:  Apply 1 application topically 2 (two) times daily as needed (feet). ) 45 g 1  . cycloSPORINE (RESTASIS) 0.05 % ophthalmic emulsion Place 1 drop into both eyes 2 (two) times daily.     . diclofenac sodium (VOLTAREN) 1 % GEL Apply 2 g topically as needed (inflammation/pain). Reported on 03/16/2015    . docusate sodium (COLACE) 50 MG capsule Take 50 mg by mouth daily as needed for mild constipation.     Marland Kitchen doxycycline (VIBRAMYCIN) 100 MG capsule Take 100 mg by mouth 2 (two) times daily.    Marland Kitchen FARXIGA 10 MG TABS tablet TAKE 1 TABLET BY MOUTH EVERY DAY AT BREAKFAST 30 tablet 4  . ferrous sulfate 325 (65 FE) MG tablet Take 325 mg by mouth daily with breakfast.    . fluconazole (DIFLUCAN)  150 MG tablet Take 1 tablet (150 mg total) by mouth once. 2 tablet 2  . glipiZIDE (GLUCOTROL) 10 MG tablet TAKE 1 TABLET BY MOUTH TWICE A DAY BEFORE A MEAL 180 tablet 0  . ibuprofen (ADVIL,MOTRIN) 200 MG tablet Take 200 mg by mouth every 6 (six) hours as needed for moderate pain.     . metFORMIN (GLUCOPHAGE) 1000 MG tablet Take 1 tablet (1,000 mg total) by mouth 2 (two) times daily with a meal. 180 tablet 1  . montelukast (SINGULAIR) 10 MG tablet Take 1 tablet (10 mg total) by mouth at bedtime. 30 tablet 3  . mupirocin ointment (BACTROBAN) 2 % Apply 1 application topically 2 (two) times daily. 30 g 1  . NIFEdipine (ADALAT CC) 90 MG 24 hr tablet TAKE 1 TABLET DAILY 90 tablet 1  . Olopatadine HCl (PATANASE) 0.6 % SOLN As directed (Patient taking differently: Place 1 puff into both nostrils daily as needed (congestion). As directed) 30.5 g 2  . pantoprazole (PROTONIX) 40 MG tablet Take 1 tablet (40 mg total) by mouth 2 (two) times daily before a meal. 180 tablet 3  . promethazine-dextromethorphan (PROMETHAZINE-DM) 6.25-15 MG/5ML syrup Take 5 mLs by mouth 4 (four) times daily as needed for cough. 118 mL 0  . Propylene Glycol (SYSTANE BALANCE OP) Apply 1 drop to eye daily.    Marland Kitchen RELION CONFIRM/MICRO TEST test strip USE STRIPS TO TEST BLOOD SUGAR TWICE DAILY 100 each 0  . triamterene-hydrochlorothiazide (MAXZIDE-25) 37.5-25 MG tablet TAKE 1 TABLET BY MOUTH DAILY. 30 tablet 4   No current facility-administered medications for this visit.    Allergies as of 07/21/2015 - Review Complete 07/21/2015  Allergen Reaction Noted  . Ace inhibitors Cough 03/26/2012  . Aspirin    . Oxycodone-acetaminophen Hives 02/21/2007  . Penicillins Hives   . Statins  10/11/2012    Family History  Problem Relation Age of Onset  . Heart failure Mother   . Hypertension Mother   . Arthritis Mother   . Stroke Father   . Hypertension Sister   . Diabetes Sister   . Heart disease Sister   . Hypertension Son   . Colon  cancer Neg Hx     Social History   Social History  . Marital Status: Married    Spouse Name: [name]   . Number of Children: 4  . Years of Education: 13   Occupational History  . unemployed     Social History Main Topics  . Smoking status: Never Smoker   . Smokeless tobacco: Never Used     Comment: patient lives with a smoker   . Alcohol Use: No  . Drug Use: No  . Sexual  Activity: Yes   Other Topics Concern  . None   Social History Narrative   Patient lives at home with husband [name].    Patient has 4 children.    Patient has a some college.    Patient is right handed.    Patient is currently not working.     Review of Systems: Gen: Denies fever, chills, anorexia. Denies fatigue, weakness, weight loss.  CV: Denies chest pain, palpitations, syncope, peripheral edema, and claudication. Resp: Denies dyspnea at rest, cough, wheezing, coughing up blood, and pleurisy. GI: Denies vomiting blood, jaundice, and fecal incontinence.   Denies dysphagia or odynophagia. Derm: Denies rash, itching, dry skin Psych: Denies depression, anxiety, memory loss, confusion. No homicidal or suicidal ideation.  Heme: Denies bruising, bleeding, and enlarged lymph nodes.  Physical Exam: BP 133/78 mmHg  Pulse 70  Temp(Src) 97 F (36.1 C)  Ht 5\' 1"  (1.549 m)  Wt 179 lb 3.2 oz (81.285 kg)  BMI 33.88 kg/m2  LMP 08/18/2012 General:   Alert and oriented. No distress noted. Pleasant and cooperative.  Head:  Normocephalic and atraumatic. Eyes:  Conjuctiva clear without scleral icterus. Mouth:  Oral mucosa pink and moist. Good dentition. No lesions. Abdomen:  +BS, soft, non-tender and non-distended. No rebound or guarding. No HSM or masses noted. Msk:  Symmetrical without gross deformities. Normal posture. Extremities:  Without edema. Neurologic:  Alert and  oriented x4;  grossly normal neurologically. Psych:  Alert and cooperative. Normal mood and affect.

## 2015-07-25 ENCOUNTER — Ambulatory Visit (HOSPITAL_COMMUNITY): Payer: BLUE CROSS/BLUE SHIELD | Admitting: Hematology & Oncology

## 2015-07-26 ENCOUNTER — Encounter (HOSPITAL_COMMUNITY): Payer: BLUE CROSS/BLUE SHIELD | Attending: Oncology | Admitting: Oncology

## 2015-07-26 ENCOUNTER — Encounter (HOSPITAL_COMMUNITY): Payer: BLUE CROSS/BLUE SHIELD

## 2015-07-26 ENCOUNTER — Encounter (HOSPITAL_COMMUNITY): Payer: Self-pay | Admitting: Oncology

## 2015-07-26 VITALS — BP 138/71 | HR 75 | Temp 98.2°F | Resp 16 | Ht 61.5 in | Wt 182.5 lb

## 2015-07-26 DIAGNOSIS — D509 Iron deficiency anemia, unspecified: Secondary | ICD-10-CM | POA: Insufficient documentation

## 2015-07-26 DIAGNOSIS — D473 Essential (hemorrhagic) thrombocythemia: Secondary | ICD-10-CM

## 2015-07-26 DIAGNOSIS — G4730 Sleep apnea, unspecified: Secondary | ICD-10-CM | POA: Insufficient documentation

## 2015-07-26 DIAGNOSIS — Z9889 Other specified postprocedural states: Secondary | ICD-10-CM | POA: Diagnosis not present

## 2015-07-26 DIAGNOSIS — M1990 Unspecified osteoarthritis, unspecified site: Secondary | ICD-10-CM | POA: Diagnosis not present

## 2015-07-26 DIAGNOSIS — Z808 Family history of malignant neoplasm of other organs or systems: Secondary | ICD-10-CM | POA: Diagnosis not present

## 2015-07-26 DIAGNOSIS — E669 Obesity, unspecified: Secondary | ICD-10-CM | POA: Insufficient documentation

## 2015-07-26 DIAGNOSIS — G35 Multiple sclerosis: Secondary | ICD-10-CM | POA: Diagnosis not present

## 2015-07-26 DIAGNOSIS — G8929 Other chronic pain: Secondary | ICD-10-CM | POA: Diagnosis not present

## 2015-07-26 DIAGNOSIS — Z79899 Other long term (current) drug therapy: Secondary | ICD-10-CM | POA: Insufficient documentation

## 2015-07-26 DIAGNOSIS — D75839 Thrombocytosis, unspecified: Secondary | ICD-10-CM

## 2015-07-26 DIAGNOSIS — Z7984 Long term (current) use of oral hypoglycemic drugs: Secondary | ICD-10-CM | POA: Diagnosis not present

## 2015-07-26 DIAGNOSIS — M545 Low back pain: Secondary | ICD-10-CM | POA: Diagnosis not present

## 2015-07-26 DIAGNOSIS — K3184 Gastroparesis: Secondary | ICD-10-CM | POA: Insufficient documentation

## 2015-07-26 DIAGNOSIS — E119 Type 2 diabetes mellitus without complications: Secondary | ICD-10-CM | POA: Diagnosis not present

## 2015-07-26 DIAGNOSIS — Z880 Allergy status to penicillin: Secondary | ICD-10-CM | POA: Diagnosis not present

## 2015-07-26 DIAGNOSIS — K219 Gastro-esophageal reflux disease without esophagitis: Secondary | ICD-10-CM | POA: Insufficient documentation

## 2015-07-26 DIAGNOSIS — Z823 Family history of stroke: Secondary | ICD-10-CM | POA: Insufficient documentation

## 2015-07-26 DIAGNOSIS — I10 Essential (primary) hypertension: Secondary | ICD-10-CM | POA: Diagnosis not present

## 2015-07-26 DIAGNOSIS — Z9049 Acquired absence of other specified parts of digestive tract: Secondary | ICD-10-CM | POA: Insufficient documentation

## 2015-07-26 LAB — CBC WITH DIFFERENTIAL/PLATELET
Basophils Absolute: 0 10*3/uL (ref 0.0–0.1)
Basophils Relative: 1 %
Eosinophils Absolute: 0 10*3/uL (ref 0.0–0.7)
Eosinophils Relative: 0 %
HCT: 39.3 % (ref 36.0–46.0)
Hemoglobin: 13 g/dL (ref 12.0–15.0)
Lymphocytes Relative: 42 %
Lymphs Abs: 3.2 10*3/uL (ref 0.7–4.0)
MCH: 27.8 pg (ref 26.0–34.0)
MCHC: 33.1 g/dL (ref 30.0–36.0)
MCV: 84 fL (ref 78.0–100.0)
Monocytes Absolute: 0.3 10*3/uL (ref 0.1–1.0)
Monocytes Relative: 4 %
Neutro Abs: 4.1 10*3/uL (ref 1.7–7.7)
Neutrophils Relative %: 53 %
Platelets: 382 10*3/uL (ref 150–400)
RBC: 4.68 MIL/uL (ref 3.87–5.11)
RDW: 14.6 % (ref 11.5–15.5)
WBC: 7.7 10*3/uL (ref 4.0–10.5)

## 2015-07-26 LAB — FERRITIN: Ferritin: 53 ng/mL (ref 11–307)

## 2015-07-26 LAB — C-REACTIVE PROTEIN: CRP: 1.9 mg/dL — ABNORMAL HIGH (ref <1.0)

## 2015-07-26 LAB — SEDIMENTATION RATE: Sed Rate: 35 mm/hr — ABNORMAL HIGH (ref 0–22)

## 2015-07-26 MED ORDER — POLYSACCHAR IRON-FA-B12 150-1-25 MG-MG-MCG PO CAPS: 1.0000 | ORAL_CAPSULE | Freq: Every day | ORAL | Status: DC

## 2015-07-26 NOTE — Assessment & Plan Note (Addendum)
Thrombocytosis dating back to at least 2008 with preservation of WBC and HGB/RBC/HCT; but with a history of iron deficiency being managed with PO iron replacement therapy with normalization of HGB and improvement in Ferritin.  An elevated platelet count may be caused by either a cytokine-driven (reactive) mechanism), or may be the result of growth factor-independent (autonomous) overproduction of platelets by clonal/neoplastic megakaryocytes, as in one to the myeloproliferative or myelodysplastic disorders.  As mentioned, the initial diagnostic question is whether thrombocytosis is a reactive phenomenon or a marker for the presence of a hematologic disorder? Causes of reactive thrombocytosis:  Infection- 31%  Infectious plus postsurgical status- 27%  Postsurgical status- 16%  Malignancy- 9%  Postsplenectomy state- 9%  Acute blood loss or iron deficiency- 8% Reactive thrombocytosis is a much more frequent cause of thrombocytosis than autonomous; even in cases with extreme thrombocytosis (plt count > 1,000,000/microL).  Infection- 31%  Postsplenectomy or hyposplenism- 19%  Malignancy- 14%  Trauma- 14%  Inflammation (noninfectious- 9%  Blood loss- 6%  Rebound thrombocytosis- 3%  Uncertain cause- 4% Regardless of cause, a high platelet count has the potential to be associated with vasomotor (headache, visual symptoms, lightheadedness, atypical chest pain, acral dysesthesia, erythromelalgia), thrombotic, or bleeding complications.  However, these events are much less likely to occur in association with reactive thrombocytosis than autonomous thrombocytosis.   Initial evaluation should be confirmed on repeat testing and examination of the peripheral blood smear (to exclude cases of spurious thrombocytosis).  If thrombocytosis is confirmed, then a comprehensive history and physical examination is warranted with special attention to the following:  Recent trauma or surgery  Prior surgical removal of  the spleen  Local or systemic complaints suggesting infection or inflammation  Present and past history of bleeding, thrombosis, or iron deficiency  Prior diagnosis of a chronic hematologic disorder  Weight loss, fatigue, and other systemic complaints suggesting the presence of a malignancy  Medication use  If initial tests and observations are unrevealing, measurement of nonspecific markers of infection or inflammation may be helpful (all would be expected to be increased in patients with reactive thrombocytosis):  CRP  ESR  Plasma fibrinogen  Ferritin  If reactive process has been ruled out, the next steps include classifying the thrombocytosis as being due to one of the defined myeloproliferative or myelodysplastic disorders.  Generally speaking, autonomous thrombocytosis is a reasonable probability in a patient with chronic thrombocytosis ibn the setting of normal iron stores and intact spleen.  Patients with chronic myeloid leukemia (CML), primary myelofibrosis (PMF), polycythemia vera (PV), a number of myelodysplastic syndrome variants (MDS), atypical CML, chronic myelomonocytic leukemia (CMML), and acute myeloid leukemia (AML) can sometimes present with thrombocytosis as a prominent feature. Thus, all patients in whom a reactive process cannot be identified require a bone marrow examination with reticulin staining and cytogenetic studies. The following findings are helpful in making these diagnoses:  Chronic Myeloid Leukemia- splenomegaly, leukocytosis with circulating early granulocyte forms, thrombocytosis, and low leukocyte alkaline phosphatase activity, and BCR/ABL  Polycythemia vera-  Increased RBC mass, splenomegaly, normal arterial O2 sat, low serum EPO level, and positive JAK2 mutation  Primary myelofibrosis- splenomegaly and the presence of nucleated RBCs, teardrop RBCs, and early WBC precursors, on peripheral smear.  Presence of substantial bone marrow fibrosis also favors  diagnosis  Myelodysplastic syndromes- occasionally associated with thrombocytosis, variable degrees of anemia, leukocytosis or leukopenia, ringed sideroblasts, dysplasia, and clonal chromosomal abnormalities can occur in 40% of patients.  Acute myeloid leukemia- rarely patients with AML have thrombocytosis associated  with chromosome abnormalities and the presence of abnormal megakaryocytes on bone marrow examination.  Essential thrombocytosis- diagnosis of exclusion by excluding all causes of reactive thrombocytosis as well as all other causes of autonomous thrombocytosis.  There are no laboratory findings that are pathognomonic for ET.  Diagnostic criteria for ET proposed by the [facility] requires a sustained platelet count of over 450,000/microL . The rate of cytogenetic abnormalities is less than 5 percent, although the JAK2 V617F mutation is seen in approximately 50 percent of patients with ET.  Mutation in JAK2 has been noted in virtually all patients with PV and RARS-T (refractory anemia with ringed sideroblasts and thrombocytosis), in about 55 to 60 percent of patients with ET and PMF, and absent in patients with CML, reactive thrombocytosis, and secondary polycythemia. Mutations in CALR are present in 20 to 30 percent of patients with ET and PMF, while MPL mutations are seen in 5 to 10 percent of those with ET or PMF.  Labs today: CBC with differential, CRP, ESR, ferritin, pathologist smear review, JAK2 with reflex to exon 12/13 and CALR/E12/MPL.  Given that her platelet count is minimally elevated and improved since normalization of HGB and improvement in ferritin since starting ferrous sulfate ~ 9 months ago, suspicion for iron deficiency induced thrombocytosis is a consideration.  Return in 2-3 weeks for follow-up and review of aforementioned laboratory work.

## 2015-07-26 NOTE — Progress Notes (Signed)
[facility] Hematology/Oncology Consultation   Name: Michaela Peterson      MRN: [id number]   Date: 07/26/2015 Time:4:13 PM   REFERRING PHYSICIAN:  Tula Nakayama, MD (Primary Care Provider)  REASON FOR CONSULT:  Thrombocytosis   DIAGNOSIS:  Thrombocytosis dating back to at least 2008 with preservation of WBC and HGB/RBC/HCT  HISTORY OF PRESENT ILLNESS:   Michaela Peterson is a 60 y.o. female with a medical history significant for uncontrolled DM, obesity, hepatic steatosis, Multiple Sclerosis, H/O IDA, HTN, GERD, gastroparesis, radicular pain,  who is referred to the [facility] for thrombocytosis.  I personally reviewed and went over laboratory results with the patient.  The results are noted within this dictation.  Thrombocytosis dating back to at least 2008 with preservation of WBC and HGB/RBC/HCT  I personally reviewed and went over radiographic studies with the patient.  The results are noted within this dictation.  CT chest high-resolution from 04/21/2014 by Dr. Lamonte Sakai (pulmonology) showing no evidence of interstitial lung disease, probable mild postinfectious scarring in the medial aspect of right lower lobe, and hepatic steatosis. Mammogram performed on 08/24/2014 BI-RADS Category 1.  Chart reviewed.  Medications reviewed.  She underwent EGD/Colonoscopy by Dr. [name] in July 2015 showing a normal colon and gastric polyps treated with polypectomy and negative pathology.  She is not longer menstruating, with her [facility] about 2 years ago she reports.  She notes a history of menorrhagia secondary to uterine fibroids, but this has resolved naturally.  She denies any B symptoms.  14 lbs weight loss over the past 12 months is noted.  She denies a history and family history of blood clots or similar issue.   Review of Systems  Constitutional: Positive for weight loss (14 lbs weight loss over last 12 months) and malaise/fatigue. Negative for fever, chills and  diaphoresis.  HENT: Negative for ear pain, hearing loss, nosebleeds and tinnitus.   Eyes: Negative.  Negative for blurred vision, double vision and photophobia.  Respiratory: Negative.  Negative for cough.   Cardiovascular: Positive for chest pain (Intermittent, right side of chest). Negative for palpitations and claudication.  Gastrointestinal: Positive for nausea (increased since starting PO ferrous sulfate), abdominal pain (increased since starting PO ferrous sulfate) and constipation (increased since starting PO ferrous sulfate). Negative for blood in stool and melena.  Genitourinary: Positive for frequency. Negative for dysuria and hematuria.  Musculoskeletal: Positive for joint pain. Negative for back pain and falls.  Skin: Negative.  Negative for rash.  Neurological: Positive for headaches (Rare and intermittent with quick resolution/chronic). Negative for dizziness, loss of consciousness and weakness.  Endo/Heme/Allergies: Does not bruise/bleed easily.  Psychiatric/Behavioral: Negative.      PAST MEDICAL HISTORY:   Past Medical History  Diagnosis Date  . Diabetes mellitus, type 2 (HCC)     A1c of 6.9 in 05/2010  . Sleep apnea 2009  . MS (multiple sclerosis) ([place])   . GERD (gastroesophageal reflux disease)     GastroparesisS.;small hiatal hernia  . Hypertension   . Chronic low back pain   . Nonalcoholic fatty liver disease   . Obesity   . Anemia, iron deficiency   . Adnexal mass     Left; followed by Dr. [name]  . Gastroparesis   . Arthritis     ALLERGIES: Allergies  Allergen Reactions  . Ace Inhibitors Cough  . Aspirin     REACTION: unknown reaction  . Oxycodone-Acetaminophen Hives  . Penicillins Hives  .  Statins     NAFLD      MEDICATIONS: I have reviewed the patient's current medications.    Current Outpatient Prescriptions on File Prior to Visit  Medication Sig Dispense Refill  . clotrimazole-betamethasone (LOTRISONE) cream Apply 1 application topically  2 (two) times daily. (Patient taking differently: Apply 1 application topically 2 (two) times daily as needed (feet). ) 45 g 1  . cycloSPORINE (RESTASIS) 0.05 % ophthalmic emulsion Place 1 drop into both eyes 2 (two) times daily.     Marland Kitchen docusate sodium (COLACE) 50 MG capsule Take 50 mg by mouth daily as needed for mild constipation.     Marland Kitchen doxycycline (VIBRAMYCIN) 100 MG capsule Take 100 mg by mouth 2 (two) times daily.    Marland Kitchen FARXIGA 10 MG TABS tablet TAKE 1 TABLET BY MOUTH EVERY DAY AT BREAKFAST 30 tablet 4  . glipiZIDE (GLUCOTROL) 10 MG tablet TAKE 1 TABLET BY MOUTH TWICE A DAY BEFORE A MEAL 180 tablet 0  . ibuprofen (ADVIL,MOTRIN) 200 MG tablet Take 200 mg by mouth every 6 (six) hours as needed for moderate pain.     . metFORMIN (GLUCOPHAGE) 1000 MG tablet Take 1 tablet (1,000 mg total) by mouth 2 (two) times daily with a meal. 180 tablet 1  . montelukast (SINGULAIR) 10 MG tablet Take 1 tablet (10 mg total) by mouth at bedtime. 30 tablet 3  . mupirocin ointment (BACTROBAN) 2 % Apply 1 application topically 2 (two) times daily. 30 g 1  . NIFEdipine (ADALAT CC) 90 MG 24 hr tablet TAKE 1 TABLET DAILY 90 tablet 1  . Olopatadine HCl (PATANASE) 0.6 % SOLN As directed (Patient taking differently: Place 1 puff into both nostrils daily as needed (congestion). As directed) 30.5 g 2  . pantoprazole (PROTONIX) 40 MG tablet Take 1 tablet (40 mg total) by mouth 2 (two) times daily before a meal. 180 tablet 3  . RELION CONFIRM/MICRO TEST test strip USE STRIPS TO TEST BLOOD SUGAR TWICE DAILY 100 each 0  . triamterene-hydrochlorothiazide (MAXZIDE-25) 37.5-25 MG tablet TAKE 1 TABLET BY MOUTH DAILY. 30 tablet 4  . diclofenac sodium (VOLTAREN) 1 % GEL Apply 2 g topically as needed (inflammation/pain). Reported on 07/26/2015    . fluconazole (DIFLUCAN) 150 MG tablet Take 1 tablet (150 mg total) by mouth once. (Patient not taking: Reported on 07/26/2015) 2 tablet 2  . ondansetron (ZOFRAN) 4 MG tablet Take 1 tablet (4 mg  total) by mouth 4 (four) times daily -  before meals and at bedtime. (Patient not taking: Reported on 07/26/2015) 120 tablet 3  . promethazine-dextromethorphan (PROMETHAZINE-DM) 6.25-15 MG/5ML syrup Take 5 mLs by mouth 4 (four) times daily as needed for cough. (Patient not taking: Reported on 07/26/2015) 118 mL 0  . Propylene Glycol (SYSTANE BALANCE OP) Apply 1 drop to eye daily. Reported on 07/26/2015     No current facility-administered medications on file prior to visit.     PAST SURGICAL HISTORY Past Surgical History  Procedure Laterality Date  . Dilation and curettage of uterus      4  . Laparoscopy abdomen diagnostic      lysis of adhesions  . Tendon lengthening      Left wrist  . Cholecystectomy    . Knee arthroscopy w/ debridement    . Rotator cuff repair    . Bladder suspension    . Cesarean section      x2   . Umbilical hernia repair    . Colonoscopy  2010  Dr. [name]: tubular adenoma, few scattered diverticula  . Esophagogastroduodenoscopy  2010    Dr. [name]: normal esophagus, small hiatal hernia, questionable pale duodenal mucosa but negative for celiac sprue.   . Colonoscopy N/A 10/01/2013    Dr. Pixie Casino preparation. Normal rectum. Normal colonic mucosa  . Esophagogastroduodenoscopy N/A 10/01/2013    Dr. Rourk:gastric polyps-status post biopsy. Otherwise, normal EGD. fundic gland polyp, negative H.pylori  . Agile capsule N/A 08/11/2014    Procedure: AGILE CAPSULE;  Surgeon: Daneil Dolin, MD;  Location: [facility];  Service: Endoscopy;  Laterality: N/A;  0700  . Givens capsule study N/A 09/08/2014    Poor prep but overall unremarkable     FAMILY HISTORY: Family History  Problem Relation Age of Onset  . Heart failure Mother   . Hypertension Mother   . Arthritis Mother   . Stroke Father   . Hypertension Sister   . Diabetes Sister   . Heart disease Sister   . Hypertension Son   . Colon cancer Neg Hx    Mother deceased at the age of 25 secondary to  complications associated with heart disease Father deceased at the age of 3 from stroke and complications associated with ESRD. 2 sisters, one with DM and ESRD, the other has HTN and pre-diabetic. 2 brothers, one with schizophrenia and pre-diabeters, the other with pre-diabetes 3 sons, all healthy 1 daughter, healthy  She notes a family history of malignancy in an aunt who passed away from her cancer.  She had a "cancer in the fat."  Her one sister had a "female cancer" "not ovarian."  Treated with hysterectomy, but keeping both ovaries at time of surgery.  SOCIAL HISTORY: She denies tobacco abuse (including history of), EtOH use (including history of), and illicit drug abuse.  She is a [place] and reports that she is [place].  She is on disability secondary to [place] and joint issues.  She used to work at [employer] as a Quarry manager.  She is married x 40 years.  Her 41st anniversary is this coming September.  Social History   Social History  . Marital Status: Married    Spouse Name: [name]   . Number of Children: 4  . Years of Education: 13   Occupational History  . unemployed     Social History Main Topics  . Smoking status: Never Smoker   . Smokeless tobacco: Never Used     Comment: patient lives with a smoker   . Alcohol Use: No  . Drug Use: No  . Sexual Activity: Yes   Other Topics Concern  . None   Social History Narrative   Patient lives at home with husband [name].    Patient has 4 children.    Patient has a some college.    Patient is right handed.    Patient is currently not working.     PERFORMANCE STATUS: The patient's performance status is 1 - Symptomatic but completely ambulatory  PHYSICAL EXAM: Most Recent Vital Signs: Blood pressure 138/71, pulse 75, temperature 98.2 F (36.8 C), temperature source Oral, resp. rate 16, height 5' 1.5" (1.562 m), weight 182 lb 8 oz (82.781 kg), last menstrual period 08/18/2012, SpO2 98 %. General appearance: alert, cooperative,  appears older than stated age, no distress, moderately obese and accompanied by her husband Head: Normocephalic, without obvious abnormality, atraumatic Eyes: negative findings: lids and lashes normal, conjunctivae and sclerae normal and corneas clear Throat: normal findings: lips normal without lesions, buccal mucosa normal, tongue  midline and normal and oropharynx pink & moist without lesions or evidence of thrush and abnormal findings: dentition: upper dentures Neck: no adenopathy and supple, symmetrical, trachea midline Lungs: clear to auscultation bilaterally and normal percussion bilaterally Heart: regular rate and rhythm, S1, S2 normal, no murmur, click, rub or gallop Abdomen: soft, non-tender; bowel sounds normal; no masses,  no organomegaly Extremities: extremities normal, atraumatic, no cyanosis or edema Skin: Skin color, texture, turgor normal. No rashes or lesions Lymph nodes: Cervical, supraclavicular, and axillary nodes normal. Neurologic: Grossly normal  LABORATORY DATA:  CBC    Component Value Date/Time   WBC 7.7 07/26/2015 1220   RBC 4.68 07/26/2015 1220   HGB 13.0 07/26/2015 1220   HCT 39.3 07/26/2015 1220   PLT 382 07/26/2015 1220   MCV 84.0 07/26/2015 1220   MCH 27.8 07/26/2015 1220   MCHC 33.1 07/26/2015 1220   RDW 14.6 07/26/2015 1220   LYMPHSABS 3.2 07/26/2015 1220   MONOABS 0.3 07/26/2015 1220   EOSABS 0.0 07/26/2015 1220   BASOSABS 0.0 07/26/2015 1220      Chemistry      Component Value Date/Time   NA 142 06/08/2015 1130   K 4.2 06/08/2015 1130   CL 103 06/08/2015 1130   CO2 26 06/08/2015 1130   BUN 18 06/08/2015 1130   CREATININE 0.91 06/08/2015 1130   CREATININE 0.90 10/06/2013 0823      Component Value Date/Time   CALCIUM 9.9 06/08/2015 1130   ALKPHOS 88 06/08/2015 1130   AST 28 06/08/2015 1130   ALT 38* 06/08/2015 1130   BILITOT 0.5 06/08/2015 1130     Results for LANNIE, HEAPS (MRN [id number]) as of 07/27/2015 09:15  Ref. Range  08/31/2011 10:18 10/06/2012 13:40 09/17/2013 10:12 06/11/2014 10:50 03/24/2015 12:36 05/16/2015 11:57   Platelets Latest Ref Range: 150-400 K/uL 498 (H) 484 (H) 455 (H) 414 (H) 425 (H) 415 (H)    RADIOGRAPHY:  CLINICAL DATA: Abnormal CT chest 10/16/2013, shortness of breath with exertion. Evaluate pulmonary infiltrates.  EXAM: CT CHEST WITHOUT CONTRAST  TECHNIQUE: Multidetector CT imaging of the chest was performed following the standard protocol without intravenous contrast. High resolution imaging of the lungs, as well as inspiratory and expiratory imaging, was performed.  COMPARISON: 10/16/2013.  FINDINGS: Mediastinum/Nodes: A sub cm short axis submental lymph node is incidentally imaged. Mediastinal lymph nodes are not enlarged by CT size criteria. Hilar regions are difficult to definitively evaluate without IV contrast. No axillary adenopathy. Heart size normal. No pericardial effusion.  Lungs/Pleura: No subpleural reticulation, traction bronchiectasis/ bronchiolectasis, ground-glass, architectural distortion or honeycombing. Probable mild post infectious scarring in the medial right lower lobe (series 3, image 34). Lungs are otherwise clear. No pleural fluid. Airway is unremarkable. No air trapping.  Upper abdomen: Visualized portion of the liver is low in attenuation. Cholecystectomy. Visualized portions of the adrenal glands, kidneys, spleen, pancreas, stomach and bowel are grossly unremarkable. No upper abdominal adenopathy.  Musculoskeletal: No worrisome lytic or sclerotic lesions. Degenerative changes are seen in the spine.  IMPRESSION: 1. No evidence of interstitial lung disease. Probable mild post infectious scarring in the medial aspect of the right lower lobe. No findings to explain the patient's shortness of breath on exertion. 2. Hepatic steatosis.   Electronically Signed  By: Lorin Picket M.D.  On: 04/21/2014 08:43    PATHOLOGY:   N/A  ASSESSMENT/PLAN:   Thrombocytosis (HCC) Thrombocytosis dating back to at least 2008 with preservation of WBC and HGB/RBC/HCT; but with a history of iron deficiency being  managed with PO iron replacement therapy with normalization of HGB and improvement in Ferritin.  From Up to Date : An elevated platelet count may be caused by either a cytokine-driven (reactive) mechanism), or may be the result of growth factor-independent (autonomous) overproduction of platelets by clonal/neoplastic megakaryocytes, as in one to the myeloproliferative or myelodysplastic disorders.  As mentioned, the initial diagnostic question is whether thrombocytosis is a reactive phenomenon or a marker for the presence of a hematologic disorder? Causes of reactive thrombocytosis:  Infection- 31%  Infectious plus postsurgical status- 27%  Postsurgical status- 16%  Malignancy- 9%  Postsplenectomy state- 9%  Acute blood loss or iron deficiency- 8% Reactive thrombocytosis is a much more frequent cause of thrombocytosis than autonomous; even in cases with extreme thrombocytosis (plt count > 1,000,000/microL).  Infection- 31%  Postsplenectomy or hyposplenism- 19%  Malignancy- 14%  Trauma- 14%  Inflammation (noninfectious- 9%  Blood loss- 6%  Rebound thrombocytosis- 3%  Uncertain cause- 4% Regardless of cause, a high platelet count has the potential to be associated with vasomotor (headache, visual symptoms, lightheadedness, atypical chest pain, acral dysesthesia, erythromelalgia), thrombotic, or bleeding complications.  However, these events are much less likely to occur in association with reactive thrombocytosis than autonomous thrombocytosis.   Initial evaluation should be confirmed on repeat testing and examination of the peripheral blood smear (to exclude cases of spurious thrombocytosis).  If thrombocytosis is confirmed, then a comprehensive history and physical examination is warranted with special attention to the  following:  Recent trauma or surgery  Prior surgical removal of the spleen  Local or systemic complaints suggesting infection or inflammation  Present and past history of bleeding, thrombosis, or iron deficiency  Prior diagnosis of a chronic hematologic disorder  Weight loss, fatigue, and other systemic complaints suggesting the presence of a malignancy  Medication use  If initial tests and observations are unrevealing, measurement of nonspecific markers of infection or inflammation may be helpful (all would be expected to be increased in patients with reactive thrombocytosis):  CRP  ESR  Plasma fibrinogen  Ferritin   Will also evaluate for MPD. Labs today: CBC with differential, CRP, ESR, ferritin, pathologist smear review, JAK2 with reflex to exon 12/13 and CALR/E12/MPL.  Given that her platelet count is minimally elevated and improved since normalization of HGB and improvement in ferritin since starting ferrous sulfate ~ 9 months ago, suspicion for iron deficiency induced thrombocytosis is a consideration.  Return in 2-3 weeks for follow-up and review of aforementioned laboratory work.   IDA (iron deficiency anemia) Iron deficiency anemia, excellent response to ferrous sulfate 325 mg daily, but with intolerance consisting of abdominal pain, nausea, and constipation.  Due to intolerance issues, will switch iron preparation to Ferrex-Forte PO daily. She will call us if unable to tolerate Ferrex forte.     ORDERS PLACED FOR THIS ENCOUNTER: Orders Placed This Encounter  Procedures  . CBC with Differential  . Sedimentation rate  . Ferritin  . C-reactive protein  . JAK2 V617F, Rfx CALR/E12/MPL  . Pathologist smear review    MEDICATIONS PRESCRIBED THIS ENCOUNTER: Meds ordered this encounter  Medications  . Polysacchar Iron-FA-B12 (FERREX 150 FORTE) 150-1-25 MG-MG-MCG CAPS    Sig: Take 1 capsule by mouth daily.    Dispense:  30 capsule    Refill:  3    Order Specific  Question:  Supervising Provider    Answer:  Patrici Ranks [id number]    All questions were answered. The patient knows to call the clinic with  any problems, questions or concerns. We can certainly see the patient much sooner if necessary.  This note is electronically signed by: Molli Hazard, MD  07/26/2015 4:13 PM

## 2015-07-26 NOTE — Patient Instructions (Addendum)
[place] at [facility] Discharge Instructions  RECOMMENDATIONS MADE BY THE CONSULTANT AND ANY TEST RESULTS WILL BE SENT TO YOUR REFERRING PHYSICIAN.  Due to constipation, abdominal pain, and nausea, will change your iron to prescription strength oral iron.  This is sent into your pharmacy.  Please call us with any questions or issues related to iron. We will perform some lab work today to start the investigation  Regarding your elevated platelet count. Return in 2-3 weeks for follow-up, at which time we will review all of the lab results and make further recommendations.  Thank you for choosing [place] at [facility] to provide your oncology and hematology care.  To afford each patient quality time with our provider, please arrive at least 15 minutes before your scheduled appointment time.   Beginning January 23rd 2017 lab work for the [facility] will be done in the  Main lab at [facility] on 1st floor. If you have a lab appointment with the [facility] please come in thru the  Main Entrance and check in at the main information desk  You need to re-schedule your appointment should you arrive 10 or more minutes late.  We strive to give you quality time with our providers, and arriving late affects you and other patients whose appointments are after yours.  Also, if you no show three or more times for appointments you may be dismissed from the clinic at the providers discretion.     Again, thank you for choosing [facility].  Our hope is that these requests will decrease the amount of time that you wait before being seen by our physicians.       _____________________________________________________________  Should you have questions after your visit to [facility], please contact our office at [phone] between the hours of 8:30 a.m. and 4:30 p.m.  Voicemails left after 4:30 p.m. will not be returned until the  following business day.  For prescription refill requests, have your pharmacy contact our office.         Resources For Cancer Patients and their Caregivers ? American Cancer Society: Can assist with transportation, wigs, general needs, runs Look Good Feel Better.        831-658-9361 ? Cancer Care: Provides financial assistance, online support groups, medication/co-pay assistance.  1-800-813-HOPE 306-018-1395) ? [employer] Assists [place] cancer patients and their families through emotional , educational and financial support.  973-374-8597 ? [place] DSS Where to apply for food stamps, Medicaid and utility assistance. (256) 614-5354 ? [facility]: Transportation to medical appointments. 229-172-7174 ? Social Security Administration: May apply for disability if have a Stage IV cancer. (743)773-7776 828-629-1373 ? [employer], Disability and Transit Services: Assists with nutrition, care and transit needs. [place] Support Programs: @10RELATIVEDAYS @ > Cancer Support Group  2nd Tuesday of the month 1pm-2pm, Journey Room  > Creative Journey  3rd Tuesday of the month 1130am-1pm, Journey Room  > Look Good Feel Better  1st Wednesday of the month 10am-12 noon, Journey Room (Call [place] to register [phone])

## 2015-07-26 NOTE — Assessment & Plan Note (Signed)
Iron deficiency anemia, excellent response to ferrous sulfate 325 mg daily, but with intolerance consisting of abdominal pain, nausea, and constipation.  Due to intolerance issues, will switch iron preparation to Ferrex-Forte PO daily.

## 2015-07-27 LAB — PATHOLOGIST SMEAR REVIEW

## 2015-07-29 NOTE — Assessment & Plan Note (Signed)
Intermittent exacerbations: start taking Protonix in the morning instead of evening. 6 week return for close follow-up.

## 2015-07-29 NOTE — Assessment & Plan Note (Signed)
Last A1c 7.6. Likely worsening GERD symptoms. Zofran sent to pharmacy, dietary and behavior modification.

## 2015-07-29 NOTE — Assessment & Plan Note (Signed)
Previously offered Linzess but declining. She would like to try Miralax every other evening. 6 week return. Constipation likely playing a role in vague abdominal discomfort, bloating.

## 2015-08-01 ENCOUNTER — Other Ambulatory Visit: Payer: Self-pay | Admitting: Family Medicine

## 2015-08-01 ENCOUNTER — Other Ambulatory Visit: Payer: Self-pay

## 2015-08-01 ENCOUNTER — Ambulatory Visit: Payer: BLUE CROSS/BLUE SHIELD | Admitting: Nurse Practitioner

## 2015-08-01 NOTE — Progress Notes (Signed)
CC'D TO PCP °

## 2015-08-04 ENCOUNTER — Other Ambulatory Visit: Payer: Self-pay | Admitting: Family Medicine

## 2015-08-08 LAB — CALR + JAK2 E12-15 + MPL (REFLEXED)

## 2015-08-08 LAB — JAK2 V617F, W REFLEX TO CALR/E12/MPL

## 2015-08-10 ENCOUNTER — Encounter: Payer: Self-pay | Admitting: Nurse Practitioner

## 2015-08-10 ENCOUNTER — Ambulatory Visit (INDEPENDENT_AMBULATORY_CARE_PROVIDER_SITE_OTHER): Payer: BLUE CROSS/BLUE SHIELD | Admitting: Nurse Practitioner

## 2015-08-10 VITALS — BP 132/72 | HR 68 | Ht 61.5 in | Wt 183.0 lb

## 2015-08-10 DIAGNOSIS — G35 Multiple sclerosis: Secondary | ICD-10-CM | POA: Diagnosis not present

## 2015-08-10 NOTE — Progress Notes (Signed)
[facility]  PATIENT: Michaela Peterson DOB: 12/18/1955   REASON FOR VISIT: Follow-up for multiple sclerosis  HISTORY FROM: Patient    HISTORY OF PRESENT ILLNESS:[name], 60 year old female returns for followup. She was last seen 07/29/2014. She has a history of multiple sclerosis without significant clinical progression. Last MRI of the brain in 2011 did not show progression from previous, she is not interested in resuming any MS treatment even after discussing the importance of maintaining her current functional status.  She was diagnosed in 2004 on the basis of demyelinating plaques in the cervical spine and typical CSF findings. She was initially treated with Rebif but discontinued the drug due to her injection site reactions in April of 2005. She has not been on disease modifying treatment since that time, she reports some numbness and tingling in the feet which she feels has been stable for many years. She is also a diabetic which is not in good control with most recent hemoglobin A1c 7.6 .She also has hypertension. She denies any double vision, dysarthria, dysphagia, bowel or bladder difficulties. She has not had any falls. She denies any balance issues. She is walking for exercise. She has an intolerance to heat. She returns for reevaluation    REVIEW OF SYSTEMS: Full 14 system review of systems performed and notable only for those listed, all others are neg:  Constitutional: neg  Cardiovascular: neg Ear/Nose/Throat: neg  Skin: neg Eyes: neg Respiratory: neg Gastroitestinal: neg  Hematology/Lymphatic: neg  Endocrine: Heat intolerance Musculoskeletal: Joint swelling Allergy/Immunology: neg Neurological: Numbness in the toes  Psychiatric: neg Sleep : neg   ALLERGIES: Allergies  Allergen Reactions  . Ace Inhibitors Cough  . Aspirin     REACTION: unknown reaction  . Oxycodone-Acetaminophen Hives  . Penicillins Hives  . Statins     NAFLD    HOME  MEDICATIONS: Outpatient Prescriptions Prior to Visit  Medication Sig Dispense Refill  . clotrimazole-betamethasone (LOTRISONE) cream Apply 1 application topically 2 (two) times daily. (Patient taking differently: Apply 1 application topically 2 (two) times daily as needed (feet). ) 45 g 1  . cycloSPORINE (RESTASIS) 0.05 % ophthalmic emulsion Place 1 drop into both eyes 2 (two) times daily.     . diclofenac sodium (VOLTAREN) 1 % GEL Apply 2 g topically as needed (inflammation/pain). Reported on 07/26/2015    . docusate sodium (COLACE) 50 MG capsule Take 50 mg by mouth daily as needed for mild constipation.     Marland Kitchen doxycycline (VIBRAMYCIN) 100 MG capsule Take 100 mg by mouth 2 (two) times daily.    Marland Kitchen FARXIGA 10 MG TABS tablet TAKE 1 TABLET BY MOUTH EVERY DAY AT BREAKFAST 30 tablet 4  . fluconazole (DIFLUCAN) 150 MG tablet Take 1 tablet (150 mg total) by mouth once. 2 tablet 2  . glipiZIDE (GLUCOTROL) 10 MG tablet TAKE 1 TABLET BY MOUTH TWICE A DAY BEFORE A MEAL 180 tablet 0  . ibuprofen (ADVIL,MOTRIN) 200 MG tablet Take 200 mg by mouth every 6 (six) hours as needed for moderate pain.     . metFORMIN (GLUCOPHAGE) 1000 MG tablet Take 1 tablet (1,000 mg total) by mouth 2 (two) times daily with a meal. 180 tablet 1  . montelukast (SINGULAIR) 10 MG tablet TAKE 1 TABLET (10 MG TOTAL) BY MOUTH AT BEDTIME. 30 tablet 3  . mupirocin ointment (BACTROBAN) 2 % Apply 1 application topically 2 (two) times daily. 30 g 1  . NIFEdipine (ADALAT CC) 90 MG 24 hr tablet TAKE 1  TABLET DAILY 90 tablet 1  . Olopatadine HCl (PATANASE) 0.6 % SOLN As directed (Patient taking differently: Place 1 puff into both nostrils daily as needed (congestion). As directed) 30.5 g 2  . ondansetron (ZOFRAN) 4 MG tablet Take 1 tablet (4 mg total) by mouth 4 (four) times daily -  before meals and at bedtime. 120 tablet 3  . pantoprazole (PROTONIX) 40 MG tablet Take 1 tablet (40 mg total) by mouth 2 (two) times daily before a meal. 180 tablet 3    . Polysacchar Iron-FA-B12 (FERREX 150 FORTE) 150-1-25 MG-MG-MCG CAPS Take 1 capsule by mouth daily. 30 capsule 3  . promethazine-dextromethorphan (PROMETHAZINE-DM) 6.25-15 MG/5ML syrup Take 5 mLs by mouth 4 (four) times daily as needed for cough. 118 mL 0  . Propylene Glycol (SYSTANE BALANCE OP) Apply 1 drop to eye daily. Reported on 07/26/2015    . RELION CONFIRM/MICRO TEST test strip USE STRIPS TO TEST BLOOD SUGAR TWICE DAILY 100 each 0  . triamterene-hydrochlorothiazide (MAXZIDE-25) 37.5-25 MG tablet TAKE 1 TABLET BY MOUTH DAILY. 30 tablet 4  . glipiZIDE (GLUCOTROL) 10 MG tablet TAKE 1 TABLET (10 MG TOTAL) BY MOUTH 2 (TWO) TIMES DAILY BEFORE A MEAL. 180 tablet 0   No facility-administered medications prior to visit.    PAST MEDICAL HISTORY: Past Medical History  Diagnosis Date  . Diabetes mellitus, type 2 (HCC)     A1c of 6.9 in 05/2010  . Sleep apnea 2009  . MS (multiple sclerosis) ([place])   . GERD (gastroesophageal reflux disease)     GastroparesisS.;small hiatal hernia  . Hypertension   . Chronic low back pain   . Nonalcoholic fatty liver disease   . Obesity   . Anemia, iron deficiency   . Adnexal mass     Left; followed by Dr. [name]  . Gastroparesis   . Arthritis     PAST SURGICAL HISTORY: Past Surgical History  Procedure Laterality Date  . Dilation and curettage of uterus      4  . Laparoscopy abdomen diagnostic      lysis of adhesions  . Tendon lengthening      Left wrist  . Cholecystectomy    . Knee arthroscopy w/ debridement    . Rotator cuff repair    . Bladder suspension    . Cesarean section      x2   . Umbilical hernia repair    . Colonoscopy  2010    Dr. [name]: tubular adenoma, few scattered diverticula  . Esophagogastroduodenoscopy  2010    Dr. [name]: normal esophagus, small hiatal hernia, questionable pale duodenal mucosa but negative for celiac sprue.   . Colonoscopy N/A 10/01/2013    Dr. Pixie Casino preparation. Normal rectum. Normal colonic  mucosa  . Esophagogastroduodenoscopy N/A 10/01/2013    Dr. Rourk:gastric polyps-status post biopsy. Otherwise, normal EGD. fundic gland polyp, negative H.pylori  . Agile capsule N/A 08/11/2014    Procedure: AGILE CAPSULE;  Surgeon: Daneil Dolin, MD;  Location: [facility];  Service: Endoscopy;  Laterality: N/A;  0700  . Givens capsule study N/A 09/08/2014    Poor prep but overall unremarkable     FAMILY HISTORY: Family History  Problem Relation Age of Onset  . Heart failure Mother   . Hypertension Mother   . Arthritis Mother   . Stroke Father   . Hypertension Sister   . Diabetes Sister   . Heart disease Sister   . Hypertension Son   . Colon cancer Neg Hx  SOCIAL HISTORY: Social History   Social History  . Marital Status: Married    Spouse Name: [name]   . Number of Children: 4  . Years of Education: 13   Occupational History  . unemployed     Social History Main Topics  . Smoking status: Never Smoker   . Smokeless tobacco: Never Used     Comment: patient lives with a smoker   . Alcohol Use: No  . Drug Use: No  . Sexual Activity: Yes   Other Topics Concern  . Not on file   Social History Narrative   Patient lives at home with husband [name].    Patient has 4 children.    Patient has a some college.    Patient is right handed.    Patient is currently not working.      PHYSICAL EXAM  Filed Vitals:   08/10/15 1047  BP: 132/72  Pulse: 68  Height: 5' 1.5" (1.562 m)  Weight: 183 lb (83.008 kg)   Body mass index is 34.02 kg/(m^2). Generalized: Well developed, obese female in no acute distress  Head: normocephalic and atraumatic,. Oropharynx benign  Neck: Supple, no carotid bruits  Musculoskeletal: No deformity   Neurological examination   Mentation: Alert oriented to time, place, history taking. Follows all commands speech and language fluent  Cranial nerve II-XII: Visual acuity 20/20 bilaterally. Fundoscopic exam reveals sharp disc margins.Pupils  were equal round reactive to light extraocular movements were full, visual field were full on confrontational test. Facial sensation and strength were normal. hearing was intact to finger rubbing bilaterally. Uvula tongue midline. head turning and shoulder shrug were normal and symmetric.Tongue protrusion into cheek strength was normal. Motor: normal bulk and tone, full strength in the BUE, BLE, fine finger movements normal, no pronator drift. No focal weakness Sensory: normal and symmetric to light touch, pinprick, and vibration in the upper and lower extremities Coordination: finger-nose-finger, heel-to-shin bilaterally, no dysmetria Reflexes: Brachioradialis 2/2, biceps 2/2, triceps 2/2, patellar 2/2, Achilles absent, plantar responses were flexor bilaterally. Gait and Station: Rising up from seated position without assistance, normal stance, moderate stride, good arm swing, smooth turning, able to perform tiptoe, and heel walking without difficulty. Tandem gait is steady. Romberg negative   DIAGNOSTIC DATA (LABS, IMAGING, TESTING) - I reviewed patient records, labs, notes, testing and imaging myself where available.  Lab Results  Component Value Date   WBC 7.7 07/26/2015   HGB 13.0 07/26/2015   HCT 39.3 07/26/2015   MCV 84.0 07/26/2015   PLT 382 07/26/2015      Component Value Date/Time   NA 142 06/08/2015 1130   K 4.2 06/08/2015 1130   CL 103 06/08/2015 1130   CO2 26 06/08/2015 1130   GLUCOSE 95 06/08/2015 1130   BUN 18 06/08/2015 1130   CREATININE 0.91 06/08/2015 1130   CREATININE 0.90 10/06/2013 0823   CALCIUM 9.9 06/08/2015 1130   PROT 7.6 06/08/2015 1130   ALBUMIN 4.5 06/08/2015 1130   AST 28 06/08/2015 1130   ALT 38* 06/08/2015 1130   ALKPHOS 88 06/08/2015 1130   BILITOT 0.5 06/08/2015 1130   GFRNONAA 69 06/08/2015 1130   GFRNONAA >60 04/13/2009 1046   GFRAA 80 06/08/2015 1130   GFRAA  04/13/2009 1046    >60        The eGFR has been calculated using the MDRD  equation. This calculation has not been validated in all clinical situations. eGFR's persistently <60 mL/min signify possible Chronic Kidney Disease.   Lab  Results  Component Value Date   CHOL 175 06/08/2015   HDL 65 06/08/2015   LDLCALC 94 06/08/2015   TRIG 79 06/08/2015   CHOLHDL 2.7 06/08/2015   Lab Results  Component Value Date   HGBA1C 7.6* 06/08/2015      ASSESSMENT AND PLAN 60 y.o. year old female has a past medical history of Diabetes mellitus, type 2; Sleep apnea (2009); MS (multiple sclerosis); diagnosed in 2004 and has been off of disease modifying therapy since April 2005 due to injection site problems. Most recent MRI of the brain in 2011 did not show any progression of the disease since 2007.  Once again discussed disease modifying agents specifically oral preparations that are available now for multiple sclerosis the patient is not interested in any treatment for her multiple sclerosis. She is not interested in repeat MRI of the brain  MS symptoms are stable Continue regular exercise for overall health and well-being along with weight loss Given information on a cooling vest that can be ordered on line She will continue to follow up yearly and when necessary Dennie Bible, [facility], [facility], [facility] [address], [place], [place] [address] [phone]

## 2015-08-10 NOTE — Patient Instructions (Addendum)
MS symptoms are stable Regular exercise for overall health and well-being along with weight loss Given information on a cooling vest Follow-up yearly and when necessary

## 2015-08-11 ENCOUNTER — Other Ambulatory Visit: Payer: Self-pay | Admitting: Family Medicine

## 2015-08-15 ENCOUNTER — Encounter (HOSPITAL_COMMUNITY): Payer: Self-pay | Admitting: Hematology & Oncology

## 2015-08-15 ENCOUNTER — Ambulatory Visit (HOSPITAL_COMMUNITY): Payer: BLUE CROSS/BLUE SHIELD | Admitting: Hematology & Oncology

## 2015-08-15 ENCOUNTER — Encounter (HOSPITAL_COMMUNITY): Payer: BLUE CROSS/BLUE SHIELD | Attending: Oncology | Admitting: Hematology & Oncology

## 2015-08-15 VITALS — BP 147/58 | HR 61 | Temp 97.7°F | Resp 16 | Wt 182.5 lb

## 2015-08-15 DIAGNOSIS — K3184 Gastroparesis: Secondary | ICD-10-CM | POA: Insufficient documentation

## 2015-08-15 DIAGNOSIS — Z7984 Long term (current) use of oral hypoglycemic drugs: Secondary | ICD-10-CM | POA: Insufficient documentation

## 2015-08-15 DIAGNOSIS — Z9889 Other specified postprocedural states: Secondary | ICD-10-CM | POA: Insufficient documentation

## 2015-08-15 DIAGNOSIS — Z808 Family history of malignant neoplasm of other organs or systems: Secondary | ICD-10-CM | POA: Insufficient documentation

## 2015-08-15 DIAGNOSIS — M545 Low back pain: Secondary | ICD-10-CM | POA: Insufficient documentation

## 2015-08-15 DIAGNOSIS — I10 Essential (primary) hypertension: Secondary | ICD-10-CM | POA: Insufficient documentation

## 2015-08-15 DIAGNOSIS — D509 Iron deficiency anemia, unspecified: Secondary | ICD-10-CM | POA: Insufficient documentation

## 2015-08-15 DIAGNOSIS — M1990 Unspecified osteoarthritis, unspecified site: Secondary | ICD-10-CM | POA: Insufficient documentation

## 2015-08-15 DIAGNOSIS — Z9049 Acquired absence of other specified parts of digestive tract: Secondary | ICD-10-CM | POA: Insufficient documentation

## 2015-08-15 DIAGNOSIS — G35 Multiple sclerosis: Secondary | ICD-10-CM | POA: Insufficient documentation

## 2015-08-15 DIAGNOSIS — G4730 Sleep apnea, unspecified: Secondary | ICD-10-CM | POA: Insufficient documentation

## 2015-08-15 DIAGNOSIS — G8929 Other chronic pain: Secondary | ICD-10-CM | POA: Insufficient documentation

## 2015-08-15 DIAGNOSIS — E669 Obesity, unspecified: Secondary | ICD-10-CM | POA: Insufficient documentation

## 2015-08-15 DIAGNOSIS — D75839 Thrombocytosis, unspecified: Secondary | ICD-10-CM

## 2015-08-15 DIAGNOSIS — D473 Essential (hemorrhagic) thrombocythemia: Secondary | ICD-10-CM | POA: Diagnosis not present

## 2015-08-15 DIAGNOSIS — Z823 Family history of stroke: Secondary | ICD-10-CM | POA: Insufficient documentation

## 2015-08-15 DIAGNOSIS — E119 Type 2 diabetes mellitus without complications: Secondary | ICD-10-CM | POA: Insufficient documentation

## 2015-08-15 DIAGNOSIS — K219 Gastro-esophageal reflux disease without esophagitis: Secondary | ICD-10-CM | POA: Insufficient documentation

## 2015-08-15 DIAGNOSIS — Z880 Allergy status to penicillin: Secondary | ICD-10-CM | POA: Insufficient documentation

## 2015-08-15 DIAGNOSIS — Z79899 Other long term (current) drug therapy: Secondary | ICD-10-CM | POA: Insufficient documentation

## 2015-08-15 NOTE — Patient Instructions (Signed)
[place] at [facility] Discharge Instructions  RECOMMENDATIONS MADE BY THE CONSULTANT AND ANY TEST RESULTS WILL BE SENT TO YOUR REFERRING PHYSICIAN.  Labs every 6 weeks times 2.   Continue IRON . Return in 3 months with labs.    Thank you for choosing [place] at [facility] to provide your oncology and hematology care.  To afford each patient quality time with our provider, please arrive at least 15 minutes before your scheduled appointment time.   Beginning January 23rd 2017 lab work for the [facility] will be done in the  Main lab at [facility] on 1st floor. If you have a lab appointment with the [facility] please come in thru the  Main Entrance and check in at the main information desk  You need to re-schedule your appointment should you arrive 10 or more minutes late.  We strive to give you quality time with our providers, and arriving late affects you and other patients whose appointments are after yours.  Also, if you no show three or more times for appointments you may be dismissed from the clinic at the providers discretion.     Again, thank you for choosing [facility].  Our hope is that these requests will decrease the amount of time that you wait before being seen by our physicians.       _____________________________________________________________  Should you have questions after your visit to [facility], please contact our office at [phone] between the hours of 8:30 a.m. and 4:30 p.m.  Voicemails left after 4:30 p.m. will not be returned until the following business day.  For prescription refill requests, have your pharmacy contact our office.         Resources For Cancer Patients and their Caregivers ? American Cancer Society: Can assist with transportation, wigs, general needs, runs Look Good Feel Better.        7053870524 ? Cancer Care: Provides financial assistance, online  support groups, medication/co-pay assistance.  1-800-813-HOPE 863-450-2400) ? [facility] Assists [place] cancer patients and their families through emotional , educational and financial support.  (641)861-6917 ? [place] DSS Where to apply for food stamps, Medicaid and utility assistance. 419-360-5355 ? [facility]: Transportation to medical appointments. 214-457-3185 ? Social Security Administration: May apply for disability if have a Stage IV cancer. (775)788-1405 726-214-1714 ? [employer], Disability and Transit Services: Assists with nutrition, care and transit needs. [place] Support Programs: @10RELATIVEDAYS @ > Cancer Support Group  2nd Tuesday of the month 1pm-2pm, Journey Room  > Creative Journey  3rd Tuesday of the month 1130am-1pm, Journey Room  > Look Good Feel Better  1st Wednesday of the month 10am-12 noon, Journey Room (Call [place] to register [phone])

## 2015-08-15 NOTE — Progress Notes (Signed)
[facility] Hematology/Oncology Consultation   Name: Michaela Peterson      MRN: [id number]   Date: 08/15/2015 Time:12:47 PM   REFERRING PHYSICIAN:  Tula Nakayama, MD (Primary Care Provider)  REASON FOR CONSULT:  Thrombocytosis   DIAGNOSIS:  Thrombocytosis dating back to at least 2008 with preservation of WBC and HGB/RBC/HCT  HISTORY OF PRESENT ILLNESS:   Michaela Peterson is a 60 y.o. female with a medical history significant for uncontrolled DM, obesity, hepatic steatosis, Multiple Sclerosis, H/O IDA, HTN, GERD, gastroparesis, radicular pain,  who is referred to the [facility] for thrombocytosis.  I personally reviewed and went over laboratory results with the patient.  The results are noted within this dictation.  Thrombocytosis dating back to at least 2008 with preservation of WBC and HGB/RBC/HCT  She underwent EGD/Colonoscopy by Dr. [name] in July 2015 showing a normal colon and gastric polyps treated with polypectomy and negative pathology.  She is not longer menstruating, with her [facility] about 2 years ago she reports.  She notes a history of menorrhagia secondary to uterine fibroids, but this has resolved naturally.  She tolerates her niferex without difficulty. Ferrous sulfate was constipating, this is not.  She presents today to review laboratory studies from her last visit and for additional recommendations regarding her thrombocytosis.   Review of Systems  Constitutional: Positive for weight loss (14 lbs weight loss over last 12 months) and malaise/fatigue. Negative for fever, chills and diaphoresis.  HENT: Negative for ear pain, hearing loss, nosebleeds and tinnitus.   Eyes: Negative.  Negative for blurred vision, double vision and photophobia.  Respiratory: Negative.  Negative for cough.   Cardiovascular: Positive for chest pain (Intermittent, right side of chest). Negative for palpitations and claudication.  Gastrointestinal: Negative for nausea,  abdominal pain, constipation, blood in stool and melena.  Genitourinary: Positive for frequency. Negative for dysuria and hematuria.  Musculoskeletal: Positive for joint pain. Negative for back pain and falls.  Skin: Negative.  Negative for rash.  Neurological: Positive for headaches (Rare and intermittent with quick resolution/chronic). Negative for dizziness, loss of consciousness and weakness.  Endo/Heme/Allergies: Does not bruise/bleed easily.  Psychiatric/Behavioral: Negative.      PAST MEDICAL HISTORY:   Past Medical History  Diagnosis Date  . Diabetes mellitus, type 2 (HCC)     A1c of 6.9 in 05/2010  . Sleep apnea 2009  . MS (multiple sclerosis) ([place])   . GERD (gastroesophageal reflux disease)     GastroparesisS.;small hiatal hernia  . Hypertension   . Chronic low back pain   . Nonalcoholic fatty liver disease   . Obesity   . Anemia, iron deficiency   . Adnexal mass     Left; followed by Dr. [name]  . Gastroparesis   . Arthritis     ALLERGIES: Allergies  Allergen Reactions  . Ace Inhibitors Cough  . Aspirin     REACTION: unknown reaction  . Oxycodone-Acetaminophen Hives  . Penicillins Hives  . Statins     NAFLD      MEDICATIONS: I have reviewed the patient's current medications.    Current Outpatient Prescriptions on File Prior to Visit  Medication Sig Dispense Refill  . clotrimazole-betamethasone (LOTRISONE) cream Apply 1 application topically 2 (two) times daily. (Patient taking differently: Apply 1 application topically 2 (two) times daily as needed (feet). ) 45 g 1  . cycloSPORINE (RESTASIS) 0.05 % ophthalmic emulsion Place 1 drop into both eyes 2 (two) times  daily.     . diclofenac sodium (VOLTAREN) 1 % GEL Apply 2 g topically as needed (inflammation/pain). Reported on 07/26/2015    . docusate sodium (COLACE) 50 MG capsule Take 50 mg by mouth daily as needed for mild constipation.     Marland Kitchen doxycycline (VIBRAMYCIN) 100 MG capsule Take 100 mg by mouth 2 (two)  times daily.    Marland Kitchen FARXIGA 10 MG TABS tablet TAKE 1 TABLET BY MOUTH EVERY DAY AT BREAKFAST 30 tablet 4  . fluconazole (DIFLUCAN) 150 MG tablet Take 1 tablet (150 mg total) by mouth once. 2 tablet 2  . glipiZIDE (GLUCOTROL) 10 MG tablet TAKE 1 TABLET BY MOUTH TWICE A DAY BEFORE A MEAL 180 tablet 0  . ibuprofen (ADVIL,MOTRIN) 200 MG tablet Take 200 mg by mouth every 6 (six) hours as needed for moderate pain.     . metFORMIN (GLUCOPHAGE) 1000 MG tablet TAKE 1 TABLET TWICE A DAY WITH MEALS 180 tablet 1  . montelukast (SINGULAIR) 10 MG tablet TAKE 1 TABLET (10 MG TOTAL) BY MOUTH AT BEDTIME. 30 tablet 3  . mupirocin ointment (BACTROBAN) 2 % Apply 1 application topically 2 (two) times daily. 30 g 1  . NIFEdipine (ADALAT CC) 90 MG 24 hr tablet TAKE 1 TABLET DAILY 90 tablet 1  . Olopatadine HCl (PATANASE) 0.6 % SOLN As directed (Patient taking differently: Place 1 puff into both nostrils daily as needed (congestion). As directed) 30.5 g 2  . ondansetron (ZOFRAN) 4 MG tablet Take 1 tablet (4 mg total) by mouth 4 (four) times daily -  before meals and at bedtime. 120 tablet 3  . pantoprazole (PROTONIX) 40 MG tablet Take 1 tablet (40 mg total) by mouth 2 (two) times daily before a meal. 180 tablet 3  . Polysacchar Iron-FA-B12 (FERREX 150 FORTE) 150-1-25 MG-MG-MCG CAPS Take 1 capsule by mouth daily. 30 capsule 3  . promethazine-dextromethorphan (PROMETHAZINE-DM) 6.25-15 MG/5ML syrup Take 5 mLs by mouth 4 (four) times daily as needed for cough. 118 mL 0  . Propylene Glycol (SYSTANE BALANCE OP) Apply 1 drop to eye daily. Reported on 07/26/2015    . RELION CONFIRM/MICRO TEST test strip USE STRIPS TO TEST BLOOD SUGAR TWICE DAILY 100 each 0  . triamterene-hydrochlorothiazide (MAXZIDE-25) 37.5-25 MG tablet TAKE 1 TABLET BY MOUTH DAILY. 30 tablet 4   No current facility-administered medications on file prior to visit.     PAST SURGICAL HISTORY Past Surgical History  Procedure Laterality Date  . Dilation and  curettage of uterus      4  . Laparoscopy abdomen diagnostic      lysis of adhesions  . Tendon lengthening      Left wrist  . Cholecystectomy    . Knee arthroscopy w/ debridement    . Rotator cuff repair    . Bladder suspension    . Cesarean section      x2   . Umbilical hernia repair    . Colonoscopy  2010    Dr. [name]: tubular adenoma, few scattered diverticula  . Esophagogastroduodenoscopy  2010    Dr. [name]: normal esophagus, small hiatal hernia, questionable pale duodenal mucosa but negative for celiac sprue.   . Colonoscopy N/A 10/01/2013    Dr. Pixie Casino preparation. Normal rectum. Normal colonic mucosa  . Esophagogastroduodenoscopy N/A 10/01/2013    Dr. Rourk:gastric polyps-status post biopsy. Otherwise, normal EGD. fundic gland polyp, negative H.pylori  . Agile capsule N/A 08/11/2014    Procedure: AGILE CAPSULE;  Surgeon: Daneil Dolin, MD;  Location: [facility];  Service: Endoscopy;  Laterality: N/A;  0700  . Givens capsule study N/A 09/08/2014    Poor prep but overall unremarkable     FAMILY HISTORY: Family History  Problem Relation Age of Onset  . Heart failure Mother   . Hypertension Mother   . Arthritis Mother   . Stroke Father   . Hypertension Sister   . Diabetes Sister   . Heart disease Sister   . Hypertension Son   . Colon cancer Neg Hx    Mother deceased at the age of 16 secondary to complications associated with heart disease Father deceased at the age of 20 from stroke and complications associated with ESRD. 2 sisters, one with DM and ESRD, the other has HTN and pre-diabetic. 2 brothers, one with schizophrenia and pre-diabeters, the other with pre-diabetes 3 sons, all healthy 1 daughter, healthy  She notes a family history of malignancy in an aunt who passed away from her cancer.  She had a "cancer in the fat."  Her one sister had a "female cancer" "not ovarian."  Treated with hysterectomy, but keeping both ovaries at time of surgery.  SOCIAL  HISTORY: She denies tobacco abuse (including history of), EtOH use (including history of), and illicit drug abuse.  She is a [place] and reports that she is [place].  She is on disability secondary to [place] and joint issues.  She used to work at [employer] as a Quarry manager.  She is married x 40 years.  Her 41st anniversary is this coming September.  Social History   Social History  . Marital Status: Married    Spouse Name: [name]   . Number of Children: 4  . Years of Education: 13   Occupational History  . unemployed     Social History Main Topics  . Smoking status: Never Smoker   . Smokeless tobacco: Never Used     Comment: patient lives with a smoker   . Alcohol Use: No  . Drug Use: No  . Sexual Activity: Yes   Other Topics Concern  . None   Social History Narrative   Patient lives at home with husband [name].    Patient has 4 children.    Patient has a some college.    Patient is right handed.    Patient is currently not working.     PERFORMANCE STATUS: The patient's performance status is 1 - Symptomatic but completely ambulatory  PHYSICAL EXAM: Most Recent Vital Signs: Blood pressure 147/58, pulse 61, temperature 97.7 F (36.5 C), temperature source Oral, resp. rate 16, weight 182 lb 8 oz (82.781 kg), last menstrual period 08/18/2012, SpO2 99 %. General appearance: alert, cooperative, appears older than stated age, no distress, moderately obese and accompanied by her husband Head: Normocephalic, without obvious abnormality, atraumatic Eyes: negative findings: lids and lashes normal, conjunctivae and sclerae normal and corneas clear Throat: normal findings: lips normal without lesions, buccal mucosa normal, tongue midline and normal and oropharynx pink & moist without lesions or evidence of thrush and abnormal findings: dentition: upper dentures Neck: no adenopathy and supple, symmetrical, trachea midline Lungs: clear to auscultation bilaterally and normal percussion  bilaterally Heart: regular rate and rhythm, S1, S2 normal, no murmur, click, rub or gallop Abdomen: soft, non-tender; bowel sounds normal; no masses,  no organomegaly Extremities: extremities normal, atraumatic, no cyanosis or edema Skin: Skin color, texture, turgor normal. No rashes or lesions Lymph nodes: Cervical, supraclavicular, and axillary nodes normal. Neurologic: Grossly normal  LABORATORY  DATA:  CBC    Component Value Date/Time   WBC 7.7 07/26/2015 1220   RBC 4.68 07/26/2015 1220   HGB 13.0 07/26/2015 1220   HCT 39.3 07/26/2015 1220   PLT 382 07/26/2015 1220   MCV 84.0 07/26/2015 1220   MCH 27.8 07/26/2015 1220   MCHC 33.1 07/26/2015 1220   RDW 14.6 07/26/2015 1220   LYMPHSABS 3.2 07/26/2015 1220   MONOABS 0.3 07/26/2015 1220   EOSABS 0.0 07/26/2015 1220   BASOSABS 0.0 07/26/2015 1220      Chemistry      Component Value Date/Time   NA 142 06/08/2015 1130   K 4.2 06/08/2015 1130   CL 103 06/08/2015 1130   CO2 26 06/08/2015 1130   BUN 18 06/08/2015 1130   CREATININE 0.91 06/08/2015 1130   CREATININE 0.90 10/06/2013 0823      Component Value Date/Time   CALCIUM 9.9 06/08/2015 1130   ALKPHOS 88 06/08/2015 1130   AST 28 06/08/2015 1130   ALT 38* 06/08/2015 1130   BILITOT 0.5 06/08/2015 1130     Results for KEMANI, PURSLEY (MRN [id number]) as of 07/27/2015 09:15  Ref. Range 08/31/2011 10:18 10/06/2012 13:40 09/17/2013 10:12 06/11/2014 10:50 03/24/2015 12:36 05/16/2015 11:57   Platelets Latest Ref Range: 150-400 K/uL 498 (H) 484 (H) 455 (H) 414 (H) 425 (H) 415 (H)    JAK 2 [id number] mutation negative CALR mutation negative MPL mutation negative JAK 2 EXON 12 negative           RADIOGRAPHY:  CLINICAL DATA: Abnormal CT chest 10/16/2013, shortness of breath with exertion. Evaluate pulmonary infiltrates.  EXAM: CT CHEST WITHOUT CONTRAST  TECHNIQUE: Multidetector CT imaging of the chest was performed following the standard protocol without intravenous  contrast. High resolution imaging of the lungs, as well as inspiratory and expiratory imaging, was performed.  COMPARISON: 10/16/2013.  FINDINGS: Mediastinum/Nodes: A sub cm short axis submental lymph node is incidentally imaged. Mediastinal lymph nodes are not enlarged by CT size criteria. Hilar regions are difficult to definitively evaluate without IV contrast. No axillary adenopathy. Heart size normal. No pericardial effusion.  Lungs/Pleura: No subpleural reticulation, traction bronchiectasis/ bronchiolectasis, ground-glass, architectural distortion or honeycombing. Probable mild post infectious scarring in the medial right lower lobe (series 3, image 34). Lungs are otherwise clear. No pleural fluid. Airway is unremarkable. No air trapping.  Upper abdomen: Visualized portion of the liver is low in attenuation. Cholecystectomy. Visualized portions of the adrenal glands, kidneys, spleen, pancreas, stomach and bowel are grossly unremarkable. No upper abdominal adenopathy.  Musculoskeletal: No worrisome lytic or sclerotic lesions. Degenerative changes are seen in the spine.  IMPRESSION: 1. No evidence of interstitial lung disease. Probable mild post infectious scarring in the medial aspect of the right lower lobe. No findings to explain the patient's shortness of breath on exertion. 2. Hepatic steatosis.   Electronically Signed  By: Lorin Picket M.D.  On: 04/21/2014 08:43    ASSESSMENT/PLAN:   Thrombocytosis (HCC) IDA (iron deficiency anemia)  I reviewed labs obtained at her last visit. Evaluation for MPD was negative. Platelet count was WNL.  I suspect that her iron deficiency may have a role. She is tolerating niferex forte much better with no GI complaints today. She is agreeable to continuing with daily dosing. I would like to repeat labs in 6 weeks. She will follow-up in 3 months with repeat PE and labs.  Additional recommendations will be made at  follow-up. She is up to date in regards to GI evaluation. Pending  her ongoing ferritin levels we can discuss additional GI referral.   ORDERS PLACED FOR THIS ENCOUNTER: Orders Placed This Encounter  Procedures  . CBC with Differential  . Ferritin    All questions were answered. The patient knows to call the clinic with any problems, questions or concerns. We can certainly see the patient much sooner if necessary.  This note is electronically signed by: Molli Hazard, MD  08/15/2015 12:47 PM

## 2015-08-16 ENCOUNTER — Telehealth: Payer: Self-pay | Admitting: Family Medicine

## 2015-08-16 MED ORDER — METFORMIN HCL 1000 MG PO TABS: 1000.0000 mg | ORAL_TABLET | Freq: Two times a day (BID) | ORAL | Status: DC

## 2015-08-16 NOTE — Telephone Encounter (Signed)
Short supply of med sent in to [facility]

## 2015-08-16 NOTE — Telephone Encounter (Signed)
Ms. Kovalev is stating that she has not received her metFORMIN (GLUCOPHAGE) 1000 MG tablet  Through mail order yet and she is asking if some could be called in to [facility] in [place] until her mail order comes in

## 2015-08-24 ENCOUNTER — Ambulatory Visit (HOSPITAL_COMMUNITY)
Admission: RE | Admit: 2015-08-24 | Discharge: 2015-08-24 | Disposition: A | Discharge status: Home / Self Care | Payer: BLUE CROSS/BLUE SHIELD | Source: Ambulatory Visit | Attending: Family Medicine | Admitting: Family Medicine

## 2015-08-24 DIAGNOSIS — Z1231 Encounter for screening mammogram for malignant neoplasm of breast: Secondary | ICD-10-CM | POA: Insufficient documentation

## 2015-08-24 DIAGNOSIS — R928 Other abnormal and inconclusive findings on diagnostic imaging of breast: Secondary | ICD-10-CM | POA: Insufficient documentation

## 2015-08-29 ENCOUNTER — Other Ambulatory Visit: Payer: Self-pay | Admitting: Family Medicine

## 2015-08-29 DIAGNOSIS — R928 Other abnormal and inconclusive findings on diagnostic imaging of breast: Secondary | ICD-10-CM

## 2015-09-07 ENCOUNTER — Ambulatory Visit (HOSPITAL_COMMUNITY)
Admission: RE | Admit: 2015-09-07 | Discharge: 2015-09-07 | Disposition: A | Discharge status: Home / Self Care | Payer: BLUE CROSS/BLUE SHIELD | Source: Ambulatory Visit | Attending: Family Medicine | Admitting: Family Medicine

## 2015-09-07 DIAGNOSIS — R928 Other abnormal and inconclusive findings on diagnostic imaging of breast: Secondary | ICD-10-CM

## 2015-09-07 DIAGNOSIS — N63 Unspecified lump in breast: Secondary | ICD-10-CM | POA: Diagnosis present

## 2015-09-14 ENCOUNTER — Ambulatory Visit: Payer: BLUE CROSS/BLUE SHIELD | Admitting: Gastroenterology

## 2015-09-18 ENCOUNTER — Other Ambulatory Visit: Payer: Self-pay | Admitting: Family Medicine

## 2015-09-20 ENCOUNTER — Encounter (HOSPITAL_COMMUNITY): Payer: BLUE CROSS/BLUE SHIELD

## 2015-09-26 ENCOUNTER — Encounter (HOSPITAL_COMMUNITY): Payer: BLUE CROSS/BLUE SHIELD | Attending: Oncology

## 2015-09-26 DIAGNOSIS — Z808 Family history of malignant neoplasm of other organs or systems: Secondary | ICD-10-CM | POA: Insufficient documentation

## 2015-09-26 DIAGNOSIS — Z79899 Other long term (current) drug therapy: Secondary | ICD-10-CM | POA: Insufficient documentation

## 2015-09-26 DIAGNOSIS — G4730 Sleep apnea, unspecified: Secondary | ICD-10-CM | POA: Diagnosis not present

## 2015-09-26 DIAGNOSIS — E119 Type 2 diabetes mellitus without complications: Secondary | ICD-10-CM | POA: Insufficient documentation

## 2015-09-26 DIAGNOSIS — D473 Essential (hemorrhagic) thrombocythemia: Secondary | ICD-10-CM | POA: Insufficient documentation

## 2015-09-26 DIAGNOSIS — M1990 Unspecified osteoarthritis, unspecified site: Secondary | ICD-10-CM | POA: Diagnosis not present

## 2015-09-26 DIAGNOSIS — Z880 Allergy status to penicillin: Secondary | ICD-10-CM | POA: Insufficient documentation

## 2015-09-26 DIAGNOSIS — K3184 Gastroparesis: Secondary | ICD-10-CM | POA: Insufficient documentation

## 2015-09-26 DIAGNOSIS — G35 Multiple sclerosis: Secondary | ICD-10-CM | POA: Insufficient documentation

## 2015-09-26 DIAGNOSIS — M545 Low back pain: Secondary | ICD-10-CM | POA: Insufficient documentation

## 2015-09-26 DIAGNOSIS — D509 Iron deficiency anemia, unspecified: Secondary | ICD-10-CM | POA: Diagnosis not present

## 2015-09-26 DIAGNOSIS — Z823 Family history of stroke: Secondary | ICD-10-CM | POA: Diagnosis not present

## 2015-09-26 DIAGNOSIS — E669 Obesity, unspecified: Secondary | ICD-10-CM | POA: Insufficient documentation

## 2015-09-26 DIAGNOSIS — Z9049 Acquired absence of other specified parts of digestive tract: Secondary | ICD-10-CM | POA: Insufficient documentation

## 2015-09-26 DIAGNOSIS — K219 Gastro-esophageal reflux disease without esophagitis: Secondary | ICD-10-CM | POA: Diagnosis not present

## 2015-09-26 DIAGNOSIS — G8929 Other chronic pain: Secondary | ICD-10-CM | POA: Insufficient documentation

## 2015-09-26 DIAGNOSIS — Z9889 Other specified postprocedural states: Secondary | ICD-10-CM | POA: Insufficient documentation

## 2015-09-26 DIAGNOSIS — I10 Essential (primary) hypertension: Secondary | ICD-10-CM | POA: Insufficient documentation

## 2015-09-26 DIAGNOSIS — D75839 Thrombocytosis, unspecified: Secondary | ICD-10-CM

## 2015-09-26 DIAGNOSIS — Z7984 Long term (current) use of oral hypoglycemic drugs: Secondary | ICD-10-CM | POA: Insufficient documentation

## 2015-09-26 LAB — CBC WITH DIFFERENTIAL/PLATELET
Basophils Absolute: 0.1 10*3/uL (ref 0.0–0.1)
Basophils Relative: 1 %
Eosinophils Absolute: 0.1 10*3/uL (ref 0.0–0.7)
Eosinophils Relative: 1 %
HCT: 36.4 % (ref 36.0–46.0)
Hemoglobin: 12.2 g/dL (ref 12.0–15.0)
Lymphocytes Relative: 45 %
Lymphs Abs: 3.4 10*3/uL (ref 0.7–4.0)
MCH: 28 pg (ref 26.0–34.0)
MCHC: 33.5 g/dL (ref 30.0–36.0)
MCV: 83.7 fL (ref 78.0–100.0)
Monocytes Absolute: 0.3 10*3/uL (ref 0.1–1.0)
Monocytes Relative: 4 %
Neutro Abs: 3.7 10*3/uL (ref 1.7–7.7)
Neutrophils Relative %: 49 %
Platelets: 358 10*3/uL (ref 150–400)
RBC: 4.35 MIL/uL (ref 3.87–5.11)
RDW: 14.2 % (ref 11.5–15.5)
WBC: 7.6 10*3/uL (ref 4.0–10.5)

## 2015-09-26 LAB — FERRITIN: Ferritin: 61 ng/mL (ref 11–307)

## 2015-10-19 ENCOUNTER — Encounter: Payer: Self-pay | Admitting: Family Medicine

## 2015-10-19 ENCOUNTER — Ambulatory Visit (INDEPENDENT_AMBULATORY_CARE_PROVIDER_SITE_OTHER): Payer: BLUE CROSS/BLUE SHIELD | Admitting: Family Medicine

## 2015-10-19 VITALS — BP 120/80 | HR 63 | Resp 16 | Ht 62.0 in | Wt 185.0 lb

## 2015-10-19 DIAGNOSIS — K760 Fatty (change of) liver, not elsewhere classified: Secondary | ICD-10-CM

## 2015-10-19 DIAGNOSIS — G35 Multiple sclerosis: Secondary | ICD-10-CM

## 2015-10-19 DIAGNOSIS — E119 Type 2 diabetes mellitus without complications: Secondary | ICD-10-CM

## 2015-10-19 DIAGNOSIS — E1159 Type 2 diabetes mellitus with other circulatory complications: Secondary | ICD-10-CM

## 2015-10-19 DIAGNOSIS — I10 Essential (primary) hypertension: Secondary | ICD-10-CM

## 2015-10-19 DIAGNOSIS — E1165 Type 2 diabetes mellitus with hyperglycemia: Secondary | ICD-10-CM

## 2015-10-19 DIAGNOSIS — B351 Tinea unguium: Secondary | ICD-10-CM | POA: Diagnosis not present

## 2015-10-19 DIAGNOSIS — K219 Gastro-esophageal reflux disease without esophagitis: Secondary | ICD-10-CM

## 2015-10-19 DIAGNOSIS — IMO0002 Reserved for concepts with insufficient information to code with codable children: Secondary | ICD-10-CM

## 2015-10-19 DIAGNOSIS — E1169 Type 2 diabetes mellitus with other specified complication: Secondary | ICD-10-CM

## 2015-10-19 DIAGNOSIS — E1143 Type 2 diabetes mellitus with diabetic autonomic (poly)neuropathy: Secondary | ICD-10-CM

## 2015-10-19 DIAGNOSIS — E669 Obesity, unspecified: Secondary | ICD-10-CM

## 2015-10-19 LAB — TSH: TSH: 1.65 mIU/L

## 2015-10-19 MED ORDER — TERBINAFINE HCL 250 MG PO TABS: 250.0000 mg | ORAL_TABLET | Freq: Every day | ORAL | 1 refills | Status: DC

## 2015-10-19 MED ORDER — NIFEDIPINE ER 90 MG PO TB24: 90.0000 mg | ORAL_TABLET | Freq: Every day | ORAL | 1 refills | Status: DC

## 2015-10-19 NOTE — Patient Instructions (Signed)
F/U in 3 month, call if you need me before, call if  You need me before  NEED to test once daily, you will get a book to record, call with concerns   Goal for fasting blood sugar ranges from 80 to 130  \ New is tablet for fungal infection in nails and on feet  Foot exam is good  Commit to 64 ounces water, NO MORE SUGARY DRINKS including pepsi and lemonade and sweet tea  Labs today HBA1C, cmp and EGFR, TSH, vit D  Fasting labs 1 week before next visit, fasting lipid, cmp and EGFr, hBA1c  Thank you  for choosing [facility]. We consider it a privelige to serve you.  Delivering excellent health care in a caring and  compassionate way is our goal.  Partnering with you,  so that together we can achieve this goal is our strategy.     Please work on good  health habits so that your health will improve. 1. Commitment to daily physical activity for 30 to 60  minutes, if you are able to do this.  2. Commitment to wise food choices. Aim for half of your  food intake to be vegetable and fruit, one quarter starchy foods, and one quarter protein. Try to eat on a regular schedule  3 meals per day, snacking between meals should be limited to vegetables or fruits or small portions of nuts. 64 ounces of water per day is generally recommended, unless you have specific health conditions, like heart failure or kidney failure where you will need to limit fluid intake.  3. Commitment to sufficient and a  good quality of physical and mental rest daily, generally between 6 to 8 hours per day.  WITH PERSISTANCE AND PERSEVERANCE, THE IMPOSSIBLE , BECOMES THE NORM!

## 2015-10-20 LAB — COMPLETE METABOLIC PANEL WITH GFR
ALT: 38 U/L — ABNORMAL HIGH (ref 6–29)
AST: 31 U/L (ref 10–35)
Albumin: 4.2 g/dL (ref 3.6–5.1)
Alkaline Phosphatase: 80 U/L (ref 33–130)
BUN: 16 mg/dL (ref 7–25)
CO2: 25 mmol/L (ref 20–31)
Calcium: 9.8 mg/dL (ref 8.6–10.4)
Chloride: 102 mmol/L (ref 98–110)
Creat: 0.87 mg/dL (ref 0.50–1.05)
GFR, Est African American: 84 mL/min (ref >=60)
GFR, Est Non African American: 73 mL/min (ref >=60)
Glucose, Bld: 99 mg/dL (ref 65–99)
Potassium: 4 mmol/L (ref 3.5–5.3)
Sodium: 141 mmol/L (ref 135–146)
Total Bilirubin: 0.4 mg/dL (ref 0.2–1.2)
Total Protein: 7.3 g/dL (ref 6.1–8.1)

## 2015-10-20 LAB — HEMOGLOBIN A1C
Hgb A1c MFr Bld: 7.4 % — ABNORMAL HIGH (ref <5.7)
Mean Plasma Glucose: 166 mg/dL

## 2015-10-20 LAB — VITAMIN D 25 HYDROXY (VIT D DEFICIENCY, FRACTURES): Vit D, 25-Hydroxy: 16 ng/mL — ABNORMAL LOW (ref 30–100)

## 2015-10-21 ENCOUNTER — Telehealth: Payer: Self-pay

## 2015-10-21 MED ORDER — VITAMIN D (ERGOCALCIFEROL) 1.25 MG (50000 UNIT) PO CAPS: 50000.0000 [IU] | ORAL_CAPSULE | ORAL | 1 refills | Status: DC

## 2015-10-21 NOTE — Telephone Encounter (Signed)
-----   Message from [name], MD sent at 10/20/2015  4:55 AM EDT ----- pls let her know that sugar is improving!!! GREAT Will be less than 7 the next time if she follows through with the plan, eats on schedule and takes meds as prescribed Please  advise vit D is low, and erx Vit D3 50,000 IU once weekly #4 refill 5, and let pt know .

## 2015-10-22 ENCOUNTER — Encounter: Payer: Self-pay | Admitting: Family Medicine

## 2015-10-22 NOTE — Assessment & Plan Note (Signed)
Present on foot exam, bilateraly affecting toenails and soles of feet, course of terbinafine ordered

## 2015-10-22 NOTE — Progress Notes (Signed)
Michaela Peterson     MRN: [id number]      DOB: 03-21-1955   HPI Michaela Peterson is here for follow up and re-evaluation of chronic medical conditions, medication management and review of any available recent lab and radiology data.  Preventive health is updated, specifically  Cancer screening and Immunization.   Questions or concerns regarding consultations or procedures which the PT has had in the interim are  addressed. The PT denies any adverse reactions to current medications since the last visit.  C/o intermittent swelling and darkness of skin around left ankle Denies polyuria, polydipsia, blurred vision , or hypoglycemic episodes. Not testing regularly,  Does not feel she is doing as well as she should be  ROS Denies recent fever or chills. Denies sinus pressure, nasal congestion, ear pain or sore throat. Denies chest congestion, productive cough or wheezing. Denies chest pains, palpitations and leg swelling Denies abdominal pain, nausea, vomiting,diarrhea or constipation.   Denies dysuria, frequency, hesitancy or incontinence. Denies joint pain, swelling and limitation in mobility. Denies headaches, seizures, numbness, or tingling. Denies depression, anxiety or insomnia. Denies skin break down or rash.   PE  BP 120/80   Pulse 63   Resp 16   Ht 5\' 2"  (1.575 m)   Wt 185 lb (83.9 kg)   LMP 08/18/2012   SpO2 99%   BMI 33.84 kg/m   Patient alert and oriented and in no cardiopulmonary distress.  HEENT: No facial asymmetry, EOMI,   oropharynx pink and moist.  Neck supple no JVD, no mass.  Chest: Clear to auscultation bilaterally.  CVS: S1, S2 no murmurs, no S3.Regular rate.  ABD: Soft non tender.   Ext: No edema  MS: Adequate ROM spine, shoulders, hips and knees.  Skin: Intact, no ulcerations or rash noted.  Psych: Good eye contact, normal affect. Memory intact not anxious or depressed appearing.  CNS: CN 2-12 intact, power,  normal throughout.no focal deficits  noted.   Assessment & Plan  Uncontrolled diabetes mellitus with peripheral autonomic neuropathy Improved, pt applauded on this Michaela Peterson is reminded of the importance of commitment to daily physical activity for 30 minutes or more, as able and the need to limit carbohydrate intake to 30 to 60 grams per meal to help with blood sugar control.   The need to take medication as prescribed, test blood sugar as directed, and to call between visits if there is a concern that blood sugar is uncontrolled is also discussed.   Michaela Peterson is reminded of the importance of daily foot exam, annual eye examination, and good blood sugar, blood pressure and cholesterol control.  Diabetic Labs Latest Ref Rng & Units 10/19/2015 06/08/2015 01/18/2015 01/17/2015 09/14/2014  HbA1c <5.7 % 7.4(H) 7.6(H) - 7.4(H) 7.6(H)  Microalbumin Not estab mg/dL - - 0.7 - -  Micro/Creat Ratio <30 mcg/mg creat - - 5 - -  Chol 125 - 200 mg/dL - 175 - 167 -  HDL >=46 mg/dL - 65 - 56 -  Calc LDL <130 mg/dL - 94 - 85 -  Triglycerides <150 mg/dL - 79 - 131 -  Creatinine 0.50 - 1.05 mg/dL 0.87 0.91 - 0.82 0.79   BP/Weight 10/19/2015 08/15/2015 08/10/2015 07/26/2015 07/21/2015 06/08/2015 Q000111Q  Systolic BP 123456 Q000111Q Q000111Q 0000000 Q000111Q [id number] 0000000  Diastolic BP 80 58 72 71 78 80 80  Wt. (Lbs) 185 182.5 183 182.5 179.2 184.04 187.4  BMI 33.84 33.93 34.02 33.93 33.88 34.79 35.43   Foot/eye exam completion dates Latest  Ref Rng & Units 10/19/2015 03/01/2015  Eye Exam No Retinopathy - No Retinopathy  Foot Form Completion - Done -        Onychomycosis Present on foot exam, bilateraly affecting toenails and soles of feet, course of terbinafine ordered  Hypertension goal BP (blood pressure) < 140/90 Controlled, no change in medication DASH diet and commitment to daily physical activity for a minimum of 30 minutes discussed and encouraged, as a part of hypertension management. The importance of attaining a healthy weight is also discussed.  BP/Weight  10/19/2015 08/15/2015 08/10/2015 07/26/2015 07/21/2015 06/08/2015 Q000111Q  Systolic BP 123456 Q000111Q Q000111Q 0000000 Q000111Q [id number] 0000000  Diastolic BP 80 58 72 71 78 80 80  Wt. (Lbs) 185 182.5 183 182.5 179.2 184.04 187.4  BMI 33.84 33.93 34.02 33.93 33.88 34.79 35.43       Obesity, diabetes, and hypertension syndrome Deteriorated. Patient re-educated about  the importance of commitment to a  minimum of 150 minutes of exercise per week.  The importance of healthy food choices with portion control discussed. Encouraged to start a food diary, count calories and to consider  joining a support group. Sample diet sheets offered. Goals set by the patient for the next several months.   Weight /BMI 10/19/2015 08/15/2015 08/10/2015  WEIGHT 185 lb 182 lb 8 oz 183 lb  HEIGHT 5\' 2"  - 5' 1.5"  BMI 33.84 kg/m2 33.93 kg/m2 34.02 kg/m2      MULTIPLE SCLEROSIS Stable and on no medication, sees neurology annually , has been this year  GERD Controlled, no change in medication

## 2015-10-22 NOTE — Assessment & Plan Note (Signed)
Controlled, no change in medication  

## 2015-10-22 NOTE — Assessment & Plan Note (Signed)
Deteriorated. Patient re-educated about  the importance of commitment to a  minimum of 150 minutes of exercise per week.  The importance of healthy food choices with portion control discussed. Encouraged to start a food diary, count calories and to consider  joining a support group. Sample diet sheets offered. Goals set by the patient for the next several months.   Weight /BMI 10/19/2015 08/15/2015 08/10/2015  WEIGHT 185 lb 182 lb 8 oz 183 lb  HEIGHT 5\' 2"  - 5' 1.5"  BMI 33.84 kg/m2 33.93 kg/m2 34.02 kg/m2

## 2015-10-22 NOTE — Assessment & Plan Note (Signed)
Controlled, no change in medication DASH diet and commitment to daily physical activity for a minimum of 30 minutes discussed and encouraged, as a part of hypertension management. The importance of attaining a healthy weight is also discussed.  BP/Weight 10/19/2015 08/15/2015 08/10/2015 07/26/2015 07/21/2015 06/08/2015 Q000111Q  Systolic BP 123456 Q000111Q Q000111Q 0000000 Q000111Q [id number] 0000000  Diastolic BP 80 58 72 71 78 80 80  Wt. (Lbs) 185 182.5 183 182.5 179.2 184.04 187.4  BMI 33.84 33.93 34.02 33.93 33.88 34.79 35.43

## 2015-10-22 NOTE — Assessment & Plan Note (Signed)
Improved, pt applauded on this Michaela Peterson is reminded of the importance of commitment to daily physical activity for 30 minutes or more, as able and the need to limit carbohydrate intake to 30 to 60 grams per meal to help with blood sugar control.   The need to take medication as prescribed, test blood sugar as directed, and to call between visits if there is a concern that blood sugar is uncontrolled is also discussed.   Michaela Peterson is reminded of the importance of daily foot exam, annual eye examination, and good blood sugar, blood pressure and cholesterol control.  Diabetic Labs Latest Ref Rng & Units 10/19/2015 06/08/2015 01/18/2015 01/17/2015 09/14/2014  HbA1c <5.7 % 7.4(H) 7.6(H) - 7.4(H) 7.6(H)  Microalbumin Not estab mg/dL - - 0.7 - -  Micro/Creat Ratio <30 mcg/mg creat - - 5 - -  Chol 125 - 200 mg/dL - 175 - 167 -  HDL >=46 mg/dL - 65 - 56 -  Calc LDL <130 mg/dL - 94 - 85 -  Triglycerides <150 mg/dL - 79 - 131 -  Creatinine 0.50 - 1.05 mg/dL 0.87 0.91 - 0.82 0.79   BP/Weight 10/19/2015 08/15/2015 08/10/2015 07/26/2015 07/21/2015 06/08/2015 Q000111Q  Systolic BP 123456 Q000111Q Q000111Q 0000000 Q000111Q [id number] 0000000  Diastolic BP 80 58 72 71 78 80 80  Wt. (Lbs) 185 182.5 183 182.5 179.2 184.04 187.4  BMI 33.84 33.93 34.02 33.93 33.88 34.79 35.43   Foot/eye exam completion dates Latest Ref Rng & Units 10/19/2015 03/01/2015  Eye Exam No Retinopathy - No Retinopathy  Foot Form Completion - Done -

## 2015-10-22 NOTE — Assessment & Plan Note (Signed)
Stable and on no medication, sees neurology annually , has been this year

## 2015-10-26 ENCOUNTER — Other Ambulatory Visit: Payer: Self-pay | Admitting: Family Medicine

## 2015-10-26 NOTE — Telephone Encounter (Signed)
Michaela Peterson is calling stating that [facility] hasn't received the Rx for Vitamin D, Ergocalciferol, (DRISDOL) 50000 units CAPS capsule, please advise?

## 2015-10-28 ENCOUNTER — Other Ambulatory Visit: Payer: Self-pay

## 2015-11-07 ENCOUNTER — Other Ambulatory Visit (HOSPITAL_COMMUNITY): Payer: Self-pay

## 2015-11-07 ENCOUNTER — Encounter (HOSPITAL_COMMUNITY): Payer: BLUE CROSS/BLUE SHIELD | Attending: Oncology

## 2015-11-07 ENCOUNTER — Telehealth (HOSPITAL_COMMUNITY): Payer: Self-pay | Admitting: *Deleted

## 2015-11-07 DIAGNOSIS — Z7984 Long term (current) use of oral hypoglycemic drugs: Secondary | ICD-10-CM | POA: Diagnosis not present

## 2015-11-07 DIAGNOSIS — G35 Multiple sclerosis: Secondary | ICD-10-CM | POA: Insufficient documentation

## 2015-11-07 DIAGNOSIS — Z79899 Other long term (current) drug therapy: Secondary | ICD-10-CM | POA: Insufficient documentation

## 2015-11-07 DIAGNOSIS — K219 Gastro-esophageal reflux disease without esophagitis: Secondary | ICD-10-CM | POA: Insufficient documentation

## 2015-11-07 DIAGNOSIS — G8929 Other chronic pain: Secondary | ICD-10-CM | POA: Diagnosis not present

## 2015-11-07 DIAGNOSIS — Z808 Family history of malignant neoplasm of other organs or systems: Secondary | ICD-10-CM | POA: Diagnosis not present

## 2015-11-07 DIAGNOSIS — D473 Essential (hemorrhagic) thrombocythemia: Secondary | ICD-10-CM | POA: Diagnosis not present

## 2015-11-07 DIAGNOSIS — Z9889 Other specified postprocedural states: Secondary | ICD-10-CM | POA: Diagnosis not present

## 2015-11-07 DIAGNOSIS — E119 Type 2 diabetes mellitus without complications: Secondary | ICD-10-CM | POA: Insufficient documentation

## 2015-11-07 DIAGNOSIS — Z823 Family history of stroke: Secondary | ICD-10-CM | POA: Diagnosis not present

## 2015-11-07 DIAGNOSIS — I10 Essential (primary) hypertension: Secondary | ICD-10-CM | POA: Insufficient documentation

## 2015-11-07 DIAGNOSIS — G4730 Sleep apnea, unspecified: Secondary | ICD-10-CM | POA: Insufficient documentation

## 2015-11-07 DIAGNOSIS — E669 Obesity, unspecified: Secondary | ICD-10-CM | POA: Insufficient documentation

## 2015-11-07 DIAGNOSIS — K3184 Gastroparesis: Secondary | ICD-10-CM | POA: Insufficient documentation

## 2015-11-07 DIAGNOSIS — Z9049 Acquired absence of other specified parts of digestive tract: Secondary | ICD-10-CM | POA: Insufficient documentation

## 2015-11-07 DIAGNOSIS — D509 Iron deficiency anemia, unspecified: Secondary | ICD-10-CM

## 2015-11-07 DIAGNOSIS — D75839 Thrombocytosis, unspecified: Secondary | ICD-10-CM

## 2015-11-07 DIAGNOSIS — M1990 Unspecified osteoarthritis, unspecified site: Secondary | ICD-10-CM | POA: Diagnosis not present

## 2015-11-07 DIAGNOSIS — M545 Low back pain: Secondary | ICD-10-CM | POA: Insufficient documentation

## 2015-11-07 DIAGNOSIS — Z880 Allergy status to penicillin: Secondary | ICD-10-CM | POA: Diagnosis not present

## 2015-11-07 LAB — CBC WITH DIFFERENTIAL/PLATELET
Basophils Absolute: 0.1 10*3/uL (ref 0.0–0.1)
Basophils Relative: 1 %
Eosinophils Absolute: 0.1 10*3/uL (ref 0.0–0.7)
Eosinophils Relative: 1 %
HCT: 36.3 % (ref 36.0–46.0)
Hemoglobin: 12.2 g/dL (ref 12.0–15.0)
Lymphocytes Relative: 41 %
Lymphs Abs: 3.8 10*3/uL (ref 0.7–4.0)
MCH: 28.4 pg (ref 26.0–34.0)
MCHC: 33.6 g/dL (ref 30.0–36.0)
MCV: 84.6 fL (ref 78.0–100.0)
Monocytes Absolute: 0.4 10*3/uL (ref 0.1–1.0)
Monocytes Relative: 4 %
Neutro Abs: 4.9 10*3/uL (ref 1.7–7.7)
Neutrophils Relative %: 53 %
Platelets: 390 10*3/uL (ref 150–400)
RBC: 4.29 MIL/uL (ref 3.87–5.11)
RDW: 14.4 % (ref 11.5–15.5)
WBC: 9.3 10*3/uL (ref 4.0–10.5)

## 2015-11-07 LAB — FERRITIN: Ferritin: 38 ng/mL (ref 11–307)

## 2015-11-07 NOTE — Telephone Encounter (Signed)
No need for repeat labs per Dr.  Muse, patient aware.

## 2015-11-15 ENCOUNTER — Other Ambulatory Visit (HOSPITAL_COMMUNITY): Payer: BLUE CROSS/BLUE SHIELD

## 2015-11-15 ENCOUNTER — Ambulatory Visit (HOSPITAL_COMMUNITY): Payer: BLUE CROSS/BLUE SHIELD | Admitting: Hematology & Oncology

## 2015-11-28 ENCOUNTER — Other Ambulatory Visit: Payer: Self-pay | Admitting: Family Medicine

## 2015-11-28 DIAGNOSIS — J3089 Other allergic rhinitis: Secondary | ICD-10-CM

## 2015-12-11 ENCOUNTER — Other Ambulatory Visit (HOSPITAL_COMMUNITY): Payer: Self-pay | Admitting: Oncology

## 2015-12-11 DIAGNOSIS — D509 Iron deficiency anemia, unspecified: Secondary | ICD-10-CM

## 2015-12-14 ENCOUNTER — Encounter (HOSPITAL_COMMUNITY): Payer: BLUE CROSS/BLUE SHIELD | Attending: Oncology | Admitting: Hematology & Oncology

## 2015-12-14 ENCOUNTER — Encounter (HOSPITAL_COMMUNITY): Payer: Self-pay | Admitting: Hematology & Oncology

## 2015-12-14 VITALS — BP 140/71 | HR 65 | Temp 98.2°F | Resp 16 | Wt 185.2 lb

## 2015-12-14 DIAGNOSIS — G35 Multiple sclerosis: Secondary | ICD-10-CM | POA: Insufficient documentation

## 2015-12-14 DIAGNOSIS — Z9049 Acquired absence of other specified parts of digestive tract: Secondary | ICD-10-CM | POA: Insufficient documentation

## 2015-12-14 DIAGNOSIS — E119 Type 2 diabetes mellitus without complications: Secondary | ICD-10-CM | POA: Insufficient documentation

## 2015-12-14 DIAGNOSIS — D473 Essential (hemorrhagic) thrombocythemia: Secondary | ICD-10-CM | POA: Insufficient documentation

## 2015-12-14 DIAGNOSIS — D509 Iron deficiency anemia, unspecified: Secondary | ICD-10-CM | POA: Diagnosis not present

## 2015-12-14 DIAGNOSIS — Z9889 Other specified postprocedural states: Secondary | ICD-10-CM | POA: Insufficient documentation

## 2015-12-14 DIAGNOSIS — K9089 Other intestinal malabsorption: Secondary | ICD-10-CM

## 2015-12-14 DIAGNOSIS — Z23 Encounter for immunization: Secondary | ICD-10-CM

## 2015-12-14 DIAGNOSIS — G4730 Sleep apnea, unspecified: Secondary | ICD-10-CM | POA: Insufficient documentation

## 2015-12-14 DIAGNOSIS — Z7984 Long term (current) use of oral hypoglycemic drugs: Secondary | ICD-10-CM | POA: Insufficient documentation

## 2015-12-14 DIAGNOSIS — Z0000 Encounter for general adult medical examination without abnormal findings: Secondary | ICD-10-CM

## 2015-12-14 DIAGNOSIS — M545 Low back pain: Secondary | ICD-10-CM | POA: Insufficient documentation

## 2015-12-14 DIAGNOSIS — K3184 Gastroparesis: Secondary | ICD-10-CM | POA: Insufficient documentation

## 2015-12-14 DIAGNOSIS — K909 Intestinal malabsorption, unspecified: Secondary | ICD-10-CM

## 2015-12-14 DIAGNOSIS — Z79899 Other long term (current) drug therapy: Secondary | ICD-10-CM | POA: Insufficient documentation

## 2015-12-14 DIAGNOSIS — M1990 Unspecified osteoarthritis, unspecified site: Secondary | ICD-10-CM | POA: Insufficient documentation

## 2015-12-14 DIAGNOSIS — Z808 Family history of malignant neoplasm of other organs or systems: Secondary | ICD-10-CM | POA: Insufficient documentation

## 2015-12-14 DIAGNOSIS — K219 Gastro-esophageal reflux disease without esophagitis: Secondary | ICD-10-CM | POA: Insufficient documentation

## 2015-12-14 DIAGNOSIS — I10 Essential (primary) hypertension: Secondary | ICD-10-CM | POA: Insufficient documentation

## 2015-12-14 DIAGNOSIS — G8929 Other chronic pain: Secondary | ICD-10-CM | POA: Insufficient documentation

## 2015-12-14 DIAGNOSIS — Z823 Family history of stroke: Secondary | ICD-10-CM | POA: Insufficient documentation

## 2015-12-14 DIAGNOSIS — E669 Obesity, unspecified: Secondary | ICD-10-CM | POA: Insufficient documentation

## 2015-12-14 DIAGNOSIS — D75839 Thrombocytosis, unspecified: Secondary | ICD-10-CM

## 2015-12-14 DIAGNOSIS — Z880 Allergy status to penicillin: Secondary | ICD-10-CM | POA: Insufficient documentation

## 2015-12-14 MED ORDER — INFLUENZA VAC SPLIT QUAD 0.5 ML IM SUSY
PREFILLED_SYRINGE | INTRAMUSCULAR | Status: AC
  Filled 2015-12-14: qty 0.5

## 2015-12-14 MED ORDER — INFLUENZA VAC SPLIT QUAD 0.5 ML IM SUSY
0.5000 mL | PREFILLED_SYRINGE | Freq: Once | INTRAMUSCULAR | Status: AC
  Administered 2015-12-14: 0.5 mL via INTRAMUSCULAR

## 2015-12-14 NOTE — Patient Instructions (Addendum)
[facility] at [facility] Discharge Instructions  RECOMMENDATIONS MADE BY THE CONSULTANT AND ANY TEST RESULTS WILL BE SENT TO YOUR REFERRING PHYSICIAN.  You saw Dr. Penland today. Follow up in 6 months with lab work.  Thank you for choosing [facility] at [facility] to provide your oncology and hematology care.  To afford each patient quality time with our provider, please arrive at least 15 minutes before your scheduled appointment time.   Beginning January 23rd 2017 lab work for the Cancer Center will be done in the  Main lab at [facility] on 1st floor. If you have a lab appointment with the Cancer Center please come in thru the  Main Entrance and check in at the main information desk  You need to re-schedule your appointment should you arrive 10 or more minutes late.  We strive to give you quality time with our providers, and arriving late affects you and other patients whose appointments are after yours.  Also, if you no show three or more times for appointments you may be dismissed from the clinic at the providers discretion.     Again, thank you for choosing [facility].  Our hope is that these requests will decrease the amount of time that you wait before being seen by our physicians.       _____________________________________________________________  Should you have questions after your visit to  Cancer Center, please contact our office at [phone] between the hours of 8:30 a.m. and 4:30 p.m.  Voicemails left after 4:30 p.m. will not be returned until the following business day.  For prescription refill requests, have your pharmacy contact our office.         Resources For Cancer Patients and their Caregivers ? American Cancer Society: Can assist with transportation, wigs, general needs, runs Look Good Feel Better.        1-888-227-6333 ? Cancer Care: Provides financial assistance, online support  groups, medication/co-pay assistance.  1-800-813-HOPE (4673) ? [facility] Assists [place] cancer patients and their families through emotional , educational and financial support.  336-427-4357 ? [place] DSS Where to apply for food stamps, Medicaid and utility assistance. 336-342-1394 ? [facility]: Transportation to medical appointments. 336-347-2287 ? Social Security Administration: May apply for disability if have a Stage IV cancer. 336-342-7796 1-800-772-1213 ? [place] Aging, Disability and Transit Services: Assists with nutrition, care and transit needs. 336-349-2343  Cancer Center Support Programs: @[email or web address]@ > Cancer Support Group  2nd Tuesday of the month 1pm-2pm, Journey Room  > Creative Journey  3rd Tuesday of the month 1130am-1pm, Journey Room  > Look Good Feel Better  1st Wednesday of the month 10am-12 noon, Journey Room (Call American Cancer Society to register 1-800-395-5775)    

## 2015-12-14 NOTE — Progress Notes (Signed)
[facility] Hematology/Oncology Progress Note  Name: Michaela Peterson      MRN: [id number]   Date: 12/15/2015 Time:6:39 PM   REFERRING PHYSICIAN:  Tula Nakayama, MD (Primary Care Provider)  REASON FOR CONSULT:  Thrombocytosis   DIAGNOSIS:  Thrombocytosis dating back to at least 2008 with preservation of WBC and HGB/RBC/HCT  HISTORY OF PRESENT ILLNESS:   Michaela Peterson is a 60 y.o. female with a medical history significant for uncontrolled DM, obesity, hepatic steatosis, Multiple Sclerosis, H/O IDA, HTN, GERD, gastroparesis, radicular pain,  Who returns to the [facility] for thrombocytosis.  Michaela Peterson is unaccompanied. I personally reviewed and went over laboratory studies with the patient.  She denies pagophagia. She reports feeling tired sometimes. She denies blood in stool or sticky, tarry stool. She takes oral iron.   She is doing fine with her MS. She does better with cooler weather than warm weather.   She reports pain in her stomach sometimes, like after she eats tomatoes. This is consistent with heartburn.   She had a mammogram this year in June.   She denies swelling in her legs. No bleeding or bruising. No other major concerns or complaints.   Review of Systems  Constitutional: Positive for malaise/fatigue. Negative for chills, diaphoresis and fever.  HENT: Negative for ear pain, hearing loss, nosebleeds and tinnitus.   Eyes: Negative.  Negative for blurred vision, double vision and photophobia.  Respiratory: Negative.  Negative for cough.   Cardiovascular: Negative for palpitations, claudication and leg swelling.  Gastrointestinal: Positive for heartburn. Negative for abdominal pain, blood in stool, constipation, melena and nausea.  Genitourinary: Positive for frequency. Negative for dysuria and hematuria.  Musculoskeletal: Positive for joint pain. Negative for back pain and falls.  Skin: Negative.  Negative for rash.  Neurological:  Positive for headaches (Rare and intermittent with quick resolution/chronic). Negative for dizziness, loss of consciousness and weakness.  Endo/Heme/Allergies: Does not bruise/bleed easily.  Psychiatric/Behavioral: Negative.   14 point review of systems was performed and is negative except as detailed under history of present illness and above   PAST MEDICAL HISTORY:   Past Medical History:  Diagnosis Date  . Adnexal mass    Left; followed by Dr. [name]  . Anemia, iron deficiency   . Arthritis   . Chronic low back pain   . Diabetes mellitus, type 2 (HCC)    A1c of 6.9 in 05/2010  . Gastroparesis   . GERD (gastroesophageal reflux disease)    GastroparesisS.;small hiatal hernia  . Hypertension   . MS (multiple sclerosis) ([place])   . Nonalcoholic fatty liver disease   . Obesity   . Sleep apnea 2009    ALLERGIES: Allergies  Allergen Reactions  . Ace Inhibitors Cough  . Aspirin     REACTION: unknown reaction  . Oxycodone-Acetaminophen Hives  . Penicillins Hives  . Statins     NAFLD      MEDICATIONS: I have reviewed the patient's current medications.    Current Outpatient Prescriptions on File Prior to Visit  Medication Sig Dispense Refill  . clobetasol (TEMOVATE) 0.05 % external solution APPLY TO AFFECTED AREA ON SCALP TWICE A DAY (NOT TO FACE, GROIN OR UNDERARMS)  2  . cycloSPORINE (RESTASIS) 0.05 % ophthalmic emulsion Place 1 drop into both eyes 2 (two) times daily.     Marland Kitchen docusate sodium (COLACE) 50 MG capsule Take 50 mg by mouth daily as needed for mild constipation.     Marland Kitchen  FARXIGA 10 MG TABS tablet TAKE 1 TABLET BY MOUTH EVERY DAY AT BREAKFAST 30 tablet 4  . FERREX 150 FORTE 150-1-25 MG-MG-MCG CAPS TAKE 1 CAPSULE BY MOUTH DAILY. 30 capsule 3  . glipiZIDE (GLUCOTROL) 10 MG tablet TAKE 1 TABLET (10 MG TOTAL) BY MOUTH 2 (TWO) TIMES DAILY BEFORE A MEAL. 180 tablet 0  . ibuprofen (ADVIL,MOTRIN) 200 MG tablet Take 200 mg by mouth every 6 (six) hours as needed for moderate pain.      . metFORMIN (GLUCOPHAGE) 1000 MG tablet Take 1 tablet (1,000 mg total) by mouth 2 (two) times daily with a meal. 14 tablet 0  . montelukast (SINGULAIR) 10 MG tablet TAKE 1 TABLET (10 MG TOTAL) BY MOUTH AT BEDTIME. 30 tablet 3  . mupirocin ointment (BACTROBAN) 2 % Apply 1 application topically 2 (two) times daily. 30 g 1  . NIFEdipine (ADALAT CC) 90 MG 24 hr tablet Take 1 tablet (90 mg total) by mouth daily. 90 tablet 1  . Olopatadine HCl (PATANASE) 0.6 % SOLN As directed (Patient taking differently: Place 1 puff into both nostrils daily as needed (congestion). As directed) 30.5 g 2  . pantoprazole (PROTONIX) 40 MG tablet Take 1 tablet (40 mg total) by mouth 2 (two) times daily before a meal. 180 tablet 3  . Propylene Glycol (SYSTANE BALANCE OP) Apply 1 drop to eye daily. Reported on 07/26/2015    . RELION CONFIRM/MICRO TEST test strip USE STRIPS TO TEST BLOOD SUGAR TWICE DAILY 100 each 0  . terbinafine (LAMISIL) 250 MG tablet Take 1 tablet (250 mg total) by mouth daily. 42 tablet 1  . triamterene-hydrochlorothiazide (MAXZIDE-25) 37.5-25 MG tablet TAKE 1 TABLET BY MOUTH DAILY. 30 tablet 4  . Vitamin D, Ergocalciferol, (DRISDOL) 50000 units CAPS capsule Take 1 capsule (50,000 Units total) by mouth every 7 (seven) days. 12 capsule 1   No current facility-administered medications on file prior to visit.      PAST SURGICAL HISTORY Past Surgical History:  Procedure Laterality Date  . AGILE CAPSULE N/A 08/11/2014   Procedure: AGILE CAPSULE;  Surgeon: Daneil Dolin, MD;  Location: [facility];  Service: Endoscopy;  Laterality: N/A;  0700  . BLADDER SUSPENSION    . CESAREAN SECTION     x2   . CHOLECYSTECTOMY    . COLONOSCOPY  2010   Dr. [name]: tubular adenoma, few scattered diverticula  . COLONOSCOPY N/A 10/01/2013   Dr. Pixie Casino preparation. Normal rectum. Normal colonic mucosa  . DILATION AND CURETTAGE OF UTERUS     4  . ESOPHAGOGASTRODUODENOSCOPY  2010   Dr. [name]: normal  esophagus, small hiatal hernia, questionable pale duodenal mucosa but negative for celiac sprue.   . ESOPHAGOGASTRODUODENOSCOPY N/A 10/01/2013   Dr. Rourk:gastric polyps-status post biopsy. Otherwise, normal EGD. fundic gland polyp, negative H.pylori  . GIVENS CAPSULE STUDY N/A 09/08/2014   Poor prep but overall unremarkable   . KNEE ARTHROSCOPY W/ DEBRIDEMENT    . LAPAROSCOPY ABDOMEN DIAGNOSTIC     lysis of adhesions  . ROTATOR CUFF REPAIR    . TENDON LENGTHENING     Left wrist  . UMBILICAL HERNIA REPAIR      FAMILY HISTORY: Family History  Problem Relation Age of Onset  . Hypertension Sister   . Diabetes Sister   . Heart disease Sister   . Heart failure Mother   . Hypertension Mother   . Arthritis Mother   . Stroke Father   . Hypertension Son   . Colon cancer Neg  Hx    Mother deceased at the age of 64 secondary to complications associated with heart disease Father deceased at the age of 53 from stroke and complications associated with ESRD. 2 sisters, one with DM and ESRD, the other has HTN and pre-diabetic. 2 brothers, one with schizophrenia and pre-diabeters, the other with pre-diabetes 3 sons, all healthy 1 daughter, healthy  She notes a family history of malignancy in an aunt who passed away from her cancer.  She had a "cancer in the fat."  Her one sister had a "female cancer" "not ovarian."  Treated with hysterectomy, but keeping both ovaries at time of surgery.  SOCIAL HISTORY: She denies tobacco abuse (including history of), EtOH use (including history of), and illicit drug abuse.  She is a [place] and reports that she is [place].  She is on disability secondary to [place] and joint issues.  She used to work at [employer] as a Quarry manager.  She is married x 40 years.  Her 41st anniversary is this coming September.  Social History   Social History  . Marital status: Married    Spouse name: [name]   . Number of children: 4  . Years of education: 20   Occupational History  .  unemployed  Unemployed   Social History Main Topics  . Smoking status: Never Smoker  . Smokeless tobacco: Never Used     Comment: patient lives with a smoker   . Alcohol use No  . Drug use: No  . Sexual activity: Yes   Other Topics Concern  . None   Social History Narrative   Patient lives at home with husband [name].    Patient has 4 children.    Patient has a some college.    Patient is right handed.    Patient is currently not working.     PERFORMANCE STATUS: The patient's performance status is 1 - Symptomatic but completely ambulatory  PHYSICAL EXAM: Most Recent Vital Signs: Blood pressure 140/71, pulse 65, temperature 98.2 F (36.8 C), temperature source Oral, resp. rate 16, weight 185 lb 3.2 oz (84 kg), last menstrual period 08/18/2012, SpO2 99 %. Physical Exam  Constitutional: She is oriented to person, place, and time and well-developed, well-nourished, and in no distress.  HENT:  Head: Normocephalic and atraumatic.  Mouth/Throat: Oropharynx is clear and moist.  Eyes: Conjunctivae and EOM are normal. Pupils are equal, round, and reactive to light.  Neck: Normal range of motion. Neck supple.  Cardiovascular: Normal rate, regular rhythm and normal heart sounds.   Pulmonary/Chest: Effort normal and breath sounds normal.  Abdominal: Soft. Bowel sounds are normal.  Musculoskeletal: Normal range of motion.  Neurological: She is alert and oriented to person, place, and time. Gait normal.  Skin: Skin is warm and dry.  Nursing note and vitals reviewed.   LABORATORY DATA:  I have reviewed the data as listed. CBC    Component Value Date/Time   WBC 9.3 11/07/2015 0925   RBC 4.29 11/07/2015 0925   HGB 12.2 11/07/2015 0925   HCT 36.3 11/07/2015 0925   PLT 390 11/07/2015 0925   MCV 84.6 11/07/2015 0925   MCH 28.4 11/07/2015 0925   MCHC 33.6 11/07/2015 0925   RDW 14.4 11/07/2015 0925   LYMPHSABS 3.8 11/07/2015 0925   MONOABS 0.4 11/07/2015 0925   EOSABS 0.1 11/07/2015  0925   BASOSABS 0.1 11/07/2015 0925      Chemistry      Component Value Date/Time   NA 141 10/19/2015  1156   K 4.0 10/19/2015 1156   CL 102 10/19/2015 1156   CO2 25 10/19/2015 1156   BUN 16 10/19/2015 1156   CREATININE 0.87 10/19/2015 1156      Component Value Date/Time   CALCIUM 9.8 10/19/2015 1156   ALKPHOS 80 10/19/2015 1156   AST 31 10/19/2015 1156   ALT 38 (H) 10/19/2015 1156   BILITOT 0.4 10/19/2015 1156     Results for KEKOA, BLUHM (MRN [id number])   Ref. Range 07/26/2015 12:20 09/26/2015 11:06 11/07/2015 09:25  Platelets Latest Ref Range: 150 - 400 K/uL 382 358 390    JAK 2 V617F mutation negative CALR mutation negative MPL mutation negative JAK 2 EXON 12 negative           RADIOGRAPHY: I have personally reviewed the radiological images as listed and agreed with the findings in the report.  Study Result   CLINICAL DATA:  60 year old female presenting for screening recall of possible bilateral breast distortions.  EXAM: 2D DIGITAL DIAGNOSTIC BILATERAL MAMMOGRAM WITH CAD AND ADJUNCT TOMO  COMPARISON:  Previous exam(s).  ACR Breast Density Category b: There are scattered areas of fibroglandular density.  FINDINGS: The distortion of concern in the bilateral breasts resolve with additional spot compression tomosynthesis imaging, compatible with overlapping fibroglandular tissue. No suspicious calcifications, masses or areas of distortion are seen in the bilateral breasts.  Mammographic images were processed with CAD.  IMPRESSION: 1. Resolution of the questioned areas of distortion in the bilateral breasts consistent with overlapping fibroglandular tissue.  2.  No mammographic evidence of malignancy in the bilateral breasts.  RECOMMENDATION: Screening mammogram in one year.(Code:SM-B-01Y)  I have discussed the findings and recommendations with the patient. Results were also provided in writing at the conclusion of the visit. If  applicable, a reminder letter will be sent to the patient regarding the next appointment.  BI-RADS CATEGORY  1: Negative.   Electronically Signed   By: Ammie Ferrier M.D.   On: 09/07/2015 09:18    ASSESSMENT/PLAN:   Thrombocytosis (HCC) IDA (iron deficiency anemia) Multiple Sclerosis Iron malabsorption  All of past labs reviewed were reviewed with the patient in detail. Causes of thrombocytosis were reviewed again. On last CBC her thrombocytosis had resolved. Ferritin remains low. Last colonoscopy was in 2015. I spoke with the patient about IV iron. She will continue to take oral iron and will contact our office if she decides to try IV iron. I suspect she may have a component of malabsorption or iron loss. She has been on Niferex since May and if compliant her ferritin should be much improved.   Last mammogram on 09/07/2015.  She is scheduled for follow up with PCP Dr. Moshe Cipro on 01/31/2016.  She will return for follow up in 6 months. For now I"d continue with niferex.    ORDERS PLACED FOR THIS ENCOUNTER: Orders Placed This Encounter  Procedures  . CBC with Differential  . Comprehensive metabolic panel  . Ferritin  . Iron and TIBC    All questions were answered. The patient knows to call the clinic with any problems, questions or concerns. We can certainly see the patient much sooner if necessary.  This document serves as a record of services personally performed by Ancil Linsey, MD. It was created on her behalf by Arlyce Harman, a trained medical scribe. The creation of this record is based on the scribe's personal observations and the provider's statements to them. This document has been checked and approved by the attending provider.  I have reviewed the above documentation for accuracy and completeness and I agree with the above.  This note is electronically signed by: Molli Hazard, MD  12/15/2015 6:39 PM

## 2015-12-14 NOTE — Progress Notes (Signed)
Michaela Peterson presents today for injection per MD orders. Flu Vaccination administered IM in left Upper Arm. Administration without incident. Patient tolerated well.

## 2015-12-15 ENCOUNTER — Encounter (HOSPITAL_COMMUNITY): Payer: Self-pay | Admitting: Hematology & Oncology

## 2015-12-26 ENCOUNTER — Other Ambulatory Visit: Payer: Self-pay | Admitting: Family Medicine

## 2016-01-13 ENCOUNTER — Other Ambulatory Visit: Payer: Self-pay

## 2016-01-13 MED ORDER — TRIAMTERENE-HCTZ 37.5-25 MG PO TABS: 1.0000 | ORAL_TABLET | Freq: Every day | ORAL | 1 refills | Status: DC

## 2016-01-23 ENCOUNTER — Other Ambulatory Visit: Payer: Self-pay | Admitting: Family Medicine

## 2016-01-26 ENCOUNTER — Other Ambulatory Visit: Payer: Self-pay

## 2016-01-26 MED ORDER — VITAMIN D (ERGOCALCIFEROL) 1.25 MG (50000 UNIT) PO CAPS: 50000.0000 [IU] | ORAL_CAPSULE | ORAL | 1 refills | Status: DC

## 2016-01-30 ENCOUNTER — Other Ambulatory Visit: Payer: Self-pay | Admitting: Family Medicine

## 2016-01-30 ENCOUNTER — Other Ambulatory Visit: Payer: Self-pay

## 2016-01-30 MED ORDER — GLIPIZIDE 10 MG PO TABS: ORAL_TABLET | ORAL | 1 refills | Status: DC

## 2016-01-31 ENCOUNTER — Ambulatory Visit: Payer: BLUE CROSS/BLUE SHIELD | Admitting: Family Medicine

## 2016-01-31 ENCOUNTER — Other Ambulatory Visit: Payer: Self-pay | Admitting: Family Medicine

## 2016-02-06 ENCOUNTER — Other Ambulatory Visit: Payer: Self-pay

## 2016-02-06 MED ORDER — GLIPIZIDE 10 MG PO TABS: ORAL_TABLET | ORAL | 1 refills | Status: DC

## 2016-02-09 ENCOUNTER — Encounter: Payer: Self-pay | Admitting: Family Medicine

## 2016-02-09 ENCOUNTER — Other Ambulatory Visit (HOSPITAL_COMMUNITY)
Admission: RE | Admit: 2016-02-09 | Discharge: 2016-02-09 | Disposition: A | Discharge status: Home / Self Care | Payer: BLUE CROSS/BLUE SHIELD | Source: Other Acute Inpatient Hospital | Attending: Family Medicine | Admitting: Family Medicine

## 2016-02-09 ENCOUNTER — Ambulatory Visit (INDEPENDENT_AMBULATORY_CARE_PROVIDER_SITE_OTHER): Payer: BLUE CROSS/BLUE SHIELD | Admitting: Family Medicine

## 2016-02-09 VITALS — BP 168/80 | HR 50 | Temp 99.2°F | Resp 16 | Ht 62.0 in | Wt 192.0 lb

## 2016-02-09 DIAGNOSIS — I10 Essential (primary) hypertension: Secondary | ICD-10-CM

## 2016-02-09 DIAGNOSIS — E1169 Type 2 diabetes mellitus with other specified complication: Secondary | ICD-10-CM

## 2016-02-09 DIAGNOSIS — E119 Type 2 diabetes mellitus without complications: Secondary | ICD-10-CM

## 2016-02-09 DIAGNOSIS — J209 Acute bronchitis, unspecified: Secondary | ICD-10-CM

## 2016-02-09 DIAGNOSIS — E1165 Type 2 diabetes mellitus with hyperglycemia: Secondary | ICD-10-CM | POA: Diagnosis present

## 2016-02-09 DIAGNOSIS — E1143 Type 2 diabetes mellitus with diabetic autonomic (poly)neuropathy: Secondary | ICD-10-CM | POA: Insufficient documentation

## 2016-02-09 DIAGNOSIS — E669 Obesity, unspecified: Secondary | ICD-10-CM

## 2016-02-09 DIAGNOSIS — E1159 Type 2 diabetes mellitus with other circulatory complications: Secondary | ICD-10-CM

## 2016-02-09 DIAGNOSIS — J440 Chronic obstructive pulmonary disease with acute lower respiratory infection: Secondary | ICD-10-CM | POA: Diagnosis not present

## 2016-02-09 DIAGNOSIS — E559 Vitamin D deficiency, unspecified: Secondary | ICD-10-CM

## 2016-02-09 DIAGNOSIS — IMO0002 Reserved for concepts with insufficient information to code with codable children: Secondary | ICD-10-CM

## 2016-02-09 MED ORDER — BENZONATATE 100 MG PO CAPS: 100.0000 mg | ORAL_CAPSULE | Freq: Two times a day (BID) | ORAL | 0 refills | Status: DC | PRN

## 2016-02-09 MED ORDER — PROMETHAZINE-DM 6.25-15 MG/5ML PO SYRP: ORAL_SOLUTION | ORAL | 0 refills | Status: DC

## 2016-02-09 MED ORDER — CLARITHROMYCIN 500 MG PO TABS: 500.0000 mg | ORAL_TABLET | Freq: Two times a day (BID) | ORAL | 0 refills | Status: DC

## 2016-02-09 NOTE — Assessment & Plan Note (Addendum)
3 week h/o cough and chills , positive sick contact, antibiotic , decongestant and cough suppressant prescribed

## 2016-02-09 NOTE — Patient Instructions (Addendum)
F/u as before, call if you  Need me sooner  You are treated for acute bronchitis, biaxin, decongestant and cough suppressant syrup are sent to your pharmacy  [facility] you feel better soon, call if worsening or nmot recovered in next 2  Weeks  microalb from office today

## 2016-02-10 LAB — MICROALBUMIN / CREATININE URINE RATIO
Creatinine, Urine: 158 mg/dL
Microalb Creat Ratio: 16.2 mg/g creat (ref 0.0–30.0)
Microalb, Ur: 25.6 ug/mL — ABNORMAL HIGH

## 2016-02-12 NOTE — Assessment & Plan Note (Signed)
Deteriorated. Patient re-educated about  the importance of commitment to a  minimum of 150 minutes of exercise per week.  The importance of healthy food choices with portion control discussed. Encouraged to start a food diary, count calories and to consider  joining a support group. Sample diet sheets offered. Goals set by the patient for the next several months.   Weight /BMI 02/09/2016 12/14/2015 10/19/2015  WEIGHT 192 lb 185 lb 3.2 oz 185 lb  HEIGHT 5\' 2"  - 5\' 2"   BMI 35.12 kg/m2 33.87 kg/m2 33.84 kg/m2

## 2016-02-12 NOTE — Assessment & Plan Note (Signed)
Uncontrolled at visit , has been using an excessive amount of decongestants, no med change today DASH diet and commitment to daily physical activity for a minimum of 30 minutes discussed and encouraged, as a part of hypertension management. The importance of attaining a healthy weight is also discussed.  BP/Weight 02/09/2016 12/14/2015 10/19/2015 08/15/2015 08/10/2015 07/26/2015 [id number]  Systolic BP XX123456 [id number] 123456 Q000111Q Q000111Q 0000000 Q000111Q  Diastolic BP 80 71 80 58 72 71 78  Wt. (Lbs) 192 185.2 185 182.5 183 182.5 179.2  BMI 35.12 33.87 33.84 33.93 34.02 33.93 33.88

## 2016-02-12 NOTE — Assessment & Plan Note (Signed)
Michaela Peterson is reminded of the importance of commitment to daily physical activity for 30 minutes or more, as able and the need to limit carbohydrate intake to 30 to 60 grams per meal to help with blood sugar control.   The need to take medication as prescribed, test blood sugar as directed, and to call between visits if there is a concern that blood sugar is uncontrolled is also discussed.   Michaela Peterson is reminded of the importance of daily foot exam, annual eye examination, and good blood sugar, blood pressure and cholesterol control.Updated lab needed at/ before next visit.   Diabetic Labs Latest Ref Rng & Units 02/09/2016 10/19/2015 06/08/2015 01/18/2015 01/17/2015  HbA1c <5.7 % - 7.4(H) 7.6(H) - 7.4(H)  Microalbumin Not Estab. ug/mL 25.6(H) - - 0.7 -  Micro/Creat Ratio 0.0 - 30.0 mg/g creat 16.2 - - 5 -  Chol 125 - 200 mg/dL - - 175 - 167  HDL >=46 mg/dL - - 65 - 56  Calc LDL <130 mg/dL - - 94 - 85  Triglycerides <150 mg/dL - - 79 - 131  Creatinine 0.50 - 1.05 mg/dL - 0.87 0.91 - 0.82   BP/Weight 02/09/2016 12/14/2015 10/19/2015 08/15/2015 08/10/2015 07/26/2015 [id number]  Systolic BP XX123456 XX123456 123456 Q000111Q Q000111Q 0000000 Q000111Q  Diastolic BP 80 71 80 58 72 71 78  Wt. (Lbs) 192 185.2 185 182.5 183 182.5 179.2  BMI 35.12 33.87 33.84 33.93 34.02 33.93 33.88   Foot/eye exam completion dates Latest Ref Rng & Units 10/19/2015 03/01/2015  Eye Exam No Retinopathy - No Retinopathy  Foot Form Completion - Done -

## 2016-02-12 NOTE — Progress Notes (Signed)
Michaela Peterson     MRN: [id number]      DOB: [birth year]   HPI Michaela Peterson is here with a 3 week h/o hacking cough with intermittent fever and chills, not improving , other family members ill. Denies sinus pressure, sore throat, nasal drainage or ear pain Denies polyuria, polydipsia, blurred vision , or hypoglycemic episodes.    ROS Denies chest pains, palpitations and leg swelling Denies abdominal pain, nausea, vomiting,diarrhea or constipation.   Denies dysuria, frequency, hesitancy or incontinence. Denies joint pain, swelling and limitation in mobility. Denies headaches, seizures, numbness, or tingling. Denies depression, anxiety or insomnia. Denies skin break down or rash.   PE  BP (!) 168/80   Pulse (!) 50   Temp 99.2 F (37.3 C) (Oral)   Resp 16   Ht 5\' 2"  (1.575 m)   Wt 192 lb (87.1 kg)   LMP 08/18/2012   SpO2 96%   BMI 35.12 kg/m   Patient alert and oriented and in no cardiopulmonary distress.  HEENT: No facial asymmetry, EOMI,   oropharynx pink and moist.  Neck supple no JVD, no mass.No sinus tenderness, TM clear  Chest: decreased though adequate air entry, scattered crackles no wheezes  CVS: S1, S2 no murmurs, no S3.Regular rate.  ABD: Soft non tender.   Ext: No edema  MS: Adequate ROM spine, shoulders, hips and knees.  Skin: Intact, no ulcerations or rash noted.  Psych: Good eye contact, normal affect. Memory intact not anxious or depressed appearing.  CNS: CN 2-12 intact, power,  normal throughout.no focal deficits noted.   Assessment & Plan Acute bronchitis 3 week h/o cough and chills , positive sick contact, antibiotic , decongestant and cough suppressant prescribed  Hypertension goal BP (blood pressure) < 140/90 Uncontrolled at visit , has been using an excessive amount of decongestants, no med change today DASH diet and commitment to daily physical activity for a minimum of 30 minutes discussed and encouraged, as a part of hypertension  management. The importance of attaining a healthy weight is also discussed.  BP/Weight 02/09/2016 12/14/2015 10/19/2015 08/15/2015 08/10/2015 07/26/2015 [id number]  Systolic BP XX123456 XX123456 123456 Q000111Q Q000111Q 0000000 Q000111Q  Diastolic BP 80 71 80 58 72 71 78  Wt. (Lbs) 192 185.2 185 182.5 183 182.5 179.2  BMI 35.12 33.87 33.84 33.93 34.02 33.93 33.88       Uncontrolled diabetes mellitus with peripheral autonomic neuropathy Michaela Peterson is reminded of the importance of commitment to daily physical activity for 30 minutes or more, as able and the need to limit carbohydrate intake to 30 to 60 grams per meal to help with blood sugar control.   The need to take medication as prescribed, test blood sugar as directed, and to call between visits if there is a concern that blood sugar is uncontrolled is also discussed.   Michaela Peterson is reminded of the importance of daily foot exam, annual eye examination, and good blood sugar, blood pressure and cholesterol control.Updated lab needed at/ before next visit.   Diabetic Labs Latest Ref Rng & Units 02/09/2016 10/19/2015 06/08/2015 01/18/2015 01/17/2015  HbA1c <5.7 % - 7.4(H) 7.6(H) - 7.4(H)  Microalbumin Not Estab. ug/mL 25.6(H) - - 0.7 -  Micro/Creat Ratio 0.0 - 30.0 mg/g creat 16.2 - - 5 -  Chol 125 - 200 mg/dL - - 175 - 167  HDL >=46 mg/dL - - 65 - 56  Calc LDL <130 mg/dL - - 94 - 85  Triglycerides <150 mg/dL - - 79 -  131  Creatinine 0.50 - 1.05 mg/dL - 0.87 0.91 - 0.82   BP/Weight 02/09/2016 12/14/2015 10/19/2015 08/15/2015 08/10/2015 07/26/2015 [id number]  Systolic BP XX123456 XX123456 123456 Q000111Q Q000111Q 0000000 Q000111Q  Diastolic BP 80 71 80 58 72 71 78  Wt. (Lbs) 192 185.2 185 182.5 183 182.5 179.2  BMI 35.12 33.87 33.84 33.93 34.02 33.93 33.88   Foot/eye exam completion dates Latest Ref Rng & Units 10/19/2015 03/01/2015  Eye Exam No Retinopathy - No Retinopathy  Foot Form Completion - Done -        Obesity, diabetes, and hypertension syndrome Deteriorated. Patient re-educated about  the  importance of commitment to a  minimum of 150 minutes of exercise per week.  The importance of healthy food choices with portion control discussed. Encouraged to start a food diary, count calories and to consider  joining a support group. Sample diet sheets offered. Goals set by the patient for the next several months.   Weight /BMI 02/09/2016 12/14/2015 10/19/2015  WEIGHT 192 lb 185 lb 3.2 oz 185 lb  HEIGHT 5\' 2"  - 5\' 2"   BMI 35.12 kg/m2 33.87 kg/m2 33.84 kg/m2

## 2016-02-25 ENCOUNTER — Other Ambulatory Visit: Payer: Self-pay | Admitting: Family Medicine

## 2016-02-25 DIAGNOSIS — R9389 Abnormal findings on diagnostic imaging of other specified body structures: Secondary | ICD-10-CM

## 2016-02-27 ENCOUNTER — Other Ambulatory Visit: Payer: Self-pay | Admitting: Family Medicine

## 2016-02-27 ENCOUNTER — Other Ambulatory Visit: Payer: Self-pay | Admitting: Gastroenterology

## 2016-02-28 LAB — COMPREHENSIVE METABOLIC PANEL
ALT: 38 U/L — ABNORMAL HIGH (ref 6–29)
AST: 30 U/L (ref 10–35)
Albumin: 4.5 g/dL (ref 3.6–5.1)
Alkaline Phosphatase: 78 U/L (ref 33–130)
BUN: 24 mg/dL (ref 7–25)
CO2: 24 mmol/L (ref 20–31)
Calcium: 9.8 mg/dL (ref 8.6–10.4)
Chloride: 104 mmol/L (ref 98–110)
Creat: 0.88 mg/dL (ref 0.50–0.99)
Glucose, Bld: 124 mg/dL — ABNORMAL HIGH (ref 65–99)
Potassium: 4.1 mmol/L (ref 3.5–5.3)
Sodium: 142 mmol/L (ref 135–146)
Total Bilirubin: 0.3 mg/dL (ref 0.2–1.2)
Total Protein: 7.6 g/dL (ref 6.1–8.1)

## 2016-02-28 LAB — HEMOGLOBIN A1C
Hgb A1c MFr Bld: 7.3 % — ABNORMAL HIGH (ref <5.7)
Mean Plasma Glucose: 163 mg/dL

## 2016-02-28 LAB — LIPID PANEL
Cholesterol: 204 mg/dL — ABNORMAL HIGH (ref <200)
HDL: 66 mg/dL (ref >50)
LDL Cholesterol: 125 mg/dL — ABNORMAL HIGH (ref <100)
Total CHOL/HDL Ratio: 3.1 Ratio (ref <5.0)
Triglycerides: 67 mg/dL (ref <150)
VLDL: 13 mg/dL (ref <30)

## 2016-02-28 LAB — VITAMIN D 25 HYDROXY (VIT D DEFICIENCY, FRACTURES): Vit D, 25-Hydroxy: 51 ng/mL (ref 30–100)

## 2016-02-29 ENCOUNTER — Encounter: Payer: Self-pay | Admitting: Family Medicine

## 2016-02-29 ENCOUNTER — Ambulatory Visit (INDEPENDENT_AMBULATORY_CARE_PROVIDER_SITE_OTHER): Payer: BLUE CROSS/BLUE SHIELD | Admitting: Family Medicine

## 2016-02-29 VITALS — BP 130/82 | HR 82 | Resp 14 | Ht 62.0 in | Wt 192.0 lb

## 2016-02-29 DIAGNOSIS — Z2911 Encounter for prophylactic immunotherapy for respiratory syncytial virus (RSV): Secondary | ICD-10-CM

## 2016-02-29 DIAGNOSIS — I10 Essential (primary) hypertension: Secondary | ICD-10-CM

## 2016-02-29 DIAGNOSIS — E1149 Type 2 diabetes mellitus with other diabetic neurological complication: Secondary | ICD-10-CM | POA: Insufficient documentation

## 2016-02-29 DIAGNOSIS — E669 Obesity, unspecified: Secondary | ICD-10-CM

## 2016-02-29 DIAGNOSIS — E1159 Type 2 diabetes mellitus with other circulatory complications: Secondary | ICD-10-CM

## 2016-02-29 DIAGNOSIS — IMO0002 Reserved for concepts with insufficient information to code with codable children: Secondary | ICD-10-CM

## 2016-02-29 DIAGNOSIS — E1169 Type 2 diabetes mellitus with other specified complication: Secondary | ICD-10-CM

## 2016-02-29 DIAGNOSIS — E785 Hyperlipidemia, unspecified: Secondary | ICD-10-CM

## 2016-02-29 DIAGNOSIS — E1143 Type 2 diabetes mellitus with diabetic autonomic (poly)neuropathy: Secondary | ICD-10-CM | POA: Diagnosis not present

## 2016-02-29 DIAGNOSIS — E119 Type 2 diabetes mellitus without complications: Secondary | ICD-10-CM | POA: Diagnosis not present

## 2016-02-29 DIAGNOSIS — Z23 Encounter for immunization: Secondary | ICD-10-CM | POA: Diagnosis not present

## 2016-02-29 DIAGNOSIS — E1165 Type 2 diabetes mellitus with hyperglycemia: Secondary | ICD-10-CM

## 2016-02-29 LAB — URIC ACID: Uric Acid, Serum: 8.8 mg/dL — ABNORMAL HIGH (ref 2.5–7.0)

## 2016-02-29 NOTE — Assessment & Plan Note (Signed)
After obtaining informed consent, the vaccine is  administered by LPN.  

## 2016-02-29 NOTE — Patient Instructions (Signed)
F/u in 3.5 months, call if you need me before   Start coated aspirin 81 mg one daily, please  Zostavax today  We will let you know if uric acid in blood is high  Fasting labs for next visit.  Please change eating and start daily exercise , as we discussed  Please schedule eye exam  Please work on good  health habits so that your health will improve. 1. Commitment to daily physical activity for 30 to 60  minutes, if you are able to do this.  2. Commitment to wise food choices. Aim for half of your  food intake to be vegetable and fruit, one quarter starchy foods, and one quarter protein. Try to eat on a regular schedule  3 meals per day, snacking between meals should be limited to vegetables or fruits or small portions of nuts. 64 ounces of water per day is generally recommended, unless you have specific health conditions, like heart failure or kidney failure where you will need to limit fluid intake.  Thanks for choosing [facility], we consider it a privelige to serve you.  3. Commitment to sufficient and a  good quality of physical and mental rest daily, generally between 6 to 8 hours per day.  WITH PERSISTANCE AND PERSEVERANCE, THE IMPOSSIBLE , BECOMES THE NORM!

## 2016-02-29 NOTE — Assessment & Plan Note (Signed)
Controlled, no change in medication Michaela Peterson is reminded of the importance of commitment to daily physical activity for 30 minutes or more, as able and the need to limit carbohydrate intake to 30 to 60 grams per meal to help with blood sugar control.   The need to take medication as prescribed, test blood sugar as directed, and to call between visits if there is a concern that blood sugar is uncontrolled is also discussed.   Michaela Peterson is reminded of the importance of daily foot exam, annual eye examination, and good blood sugar, blood pressure and cholesterol control.  Diabetic Labs Latest Ref Rng & Units 02/27/2016 02/09/2016 10/19/2015 06/08/2015 01/18/2015  HbA1c <5.7 % 7.3(H) - 7.4(H) 7.6(H) -  Microalbumin Not Estab. ug/mL - 25.6(H) - - 0.7  Micro/Creat Ratio 0.0 - 30.0 mg/g creat - 16.2 - - 5  Chol <200 mg/dL 204(H) - - 175 -  HDL >50 mg/dL 66 - - 65 -  Calc LDL <100 mg/dL 125(H) - - 94 -  Triglycerides <150 mg/dL 67 - - 79 -  Creatinine 0.50 - 0.99 mg/dL 0.88 - 0.87 0.91 -   BP/Weight 02/29/2016 02/09/2016 12/14/2015 10/19/2015 08/15/2015 08/10/2015 Q000111Q  Systolic BP [id number] XX123456 123456 Q000111Q Q000111Q 0000000  Diastolic BP 82 80 71 80 58 72 71  Wt. (Lbs) 192 192 185.2 185 182.5 183 182.5  BMI 35.12 35.12 33.87 33.84 33.93 34.02 33.93   Foot/eye exam completion dates Latest Ref Rng & Units 10/19/2015 03/01/2015  Eye Exam No Retinopathy - No Retinopathy  Foot Form Completion - Done -

## 2016-02-29 NOTE — Assessment & Plan Note (Signed)
Controlled, no change in medication DASH diet and commitment to daily physical activity for a minimum of 30 minutes discussed and encouraged, as a part of hypertension management. The importance of attaining a healthy weight is also discussed.  BP/Weight 02/29/2016 02/09/2016 12/14/2015 10/19/2015 08/15/2015 08/10/2015 Q000111Q  Systolic BP [id number] XX123456 123456 Q000111Q Q000111Q 0000000  Diastolic BP 82 80 71 80 58 72 71  Wt. (Lbs) 192 192 185.2 185 182.5 183 182.5  BMI 35.12 35.12 33.87 33.84 33.93 34.02 33.93

## 2016-02-29 NOTE — Assessment & Plan Note (Signed)
Hyperlipidemia:Low fat diet discussed and encouraged.   Lipid Panel  Lab Results  Component Value Date   CHOL 204 (H) 02/27/2016   HDL 66 02/27/2016   LDLCALC 125 (H) 02/27/2016   TRIG 67 02/27/2016   CHOLHDL 3.1 02/27/2016   Refuses statin, rept in 3 months

## 2016-02-29 NOTE — Assessment & Plan Note (Signed)
Deteriorated. Patient re-educated about  the importance of commitment to a  minimum of 150 minutes of exercise per week.  The importance of healthy food choices with portion control discussed. Encouraged to start a food diary, count calories and to consider  joining a support group. Sample diet sheets offered. Goals set by the patient for the next several months.   Weight /BMI 02/29/2016 02/09/2016 12/14/2015  WEIGHT 192 lb 192 lb 185 lb 3.2 oz  HEIGHT 5\' 2"  5\' 2"  -  BMI 35.12 kg/m2 35.12 kg/m2 33.87 kg/m2

## 2016-02-29 NOTE — Progress Notes (Signed)
Michaela Peterson     MRN: [id number]      DOB: May 08, 1955   HPI Michaela Peterson is here for follow up and re-evaluation of chronic medical conditions, medication management and review of any available recent lab and radiology data.  Preventive health is updated, specifically  Cancer screening and Immunization.   Questions or concerns regarding consultations or procedures which the PT has had in the interim are  addressed. The PT denies any adverse reactions to current medications since the last visit.  There are no new concerns.  There are no specific complaints   ROS Denies recent fever or chills. Denies sinus pressure, nasal congestion, ear pain or sore throat. Denies chest congestion, productive cough or wheezing. Denies chest pains, palpitations and leg swelling Denies abdominal pain, nausea, vomiting,diarrhea or constipation.   Denies dysuria, frequency, hesitancy or incontinence. Denies joint pain, swelling and limitation in mobility. Denies headaches, seizures, numbness, or tingling. Denies depression, anxiety or insomnia. Denies skin break down or rash.   PE  BP 130/82   Pulse 82   Resp 14   Ht 5\' 2"  (1.575 m)   Wt 192 lb (87.1 kg)   LMP 08/18/2012   SpO2 98%   BMI 35.12 kg/m   Patient alert and oriented and in no cardiopulmonary distress.  HEENT: No facial asymmetry, EOMI,   oropharynx pink and moist.  Neck supple no JVD, no mass.  Chest: Clear to auscultation bilaterally.  CVS: S1, S2 no murmurs, no S3.Regular rate.  ABD: Soft non tender.   Ext: No edema  MS: Adequate ROM spine, shoulders, hips and knees.  Skin: Intact, no ulcerations or rash noted.  Psych: Good eye contact, normal affect. Memory intact not anxious or depressed appearing.  CNS: CN 2-12 intact, power,  normal throughout.no focal deficits noted.   Assessment & Plan  Hypertension goal BP (blood pressure) < 140/90 Controlled, no change in medication DASH diet and commitment to daily  physical activity for a minimum of 30 minutes discussed and encouraged, as a part of hypertension management. The importance of attaining a healthy weight is also discussed.  BP/Weight 02/29/2016 02/09/2016 12/14/2015 10/19/2015 08/15/2015 08/10/2015 Q000111Q  Systolic BP [id number] XX123456 123456 Q000111Q Q000111Q 0000000  Diastolic BP 82 80 71 80 58 72 71  Wt. (Lbs) 192 192 185.2 185 182.5 183 182.5  BMI 35.12 35.12 33.87 33.84 33.93 34.02 33.93       Obesity, diabetes, and hypertension syndrome Deteriorated. Patient re-educated about  the importance of commitment to a  minimum of 150 minutes of exercise per week.  The importance of healthy food choices with portion control discussed. Encouraged to start a food diary, count calories and to consider  joining a support group. Sample diet sheets offered. Goals set by the patient for the next several months.   Weight /BMI 02/29/2016 02/09/2016 12/14/2015  WEIGHT 192 lb 192 lb 185 lb 3.2 oz  HEIGHT 5\' 2"  5\' 2"  -  BMI 35.12 kg/m2 35.12 kg/m2 33.87 kg/m2      Diabetes mellitus type 2 in obese (HCC) Controlled, no change in medication Michaela Peterson is reminded of the importance of commitment to daily physical activity for 30 minutes or more, as able and the need to limit carbohydrate intake to 30 to 60 grams per meal to help with blood sugar control.   The need to take medication as prescribed, test blood sugar as directed, and to call between visits if there is a concern that blood sugar  is uncontrolled is also discussed.   Michaela Peterson is reminded of the importance of daily foot exam, annual eye examination, and good blood sugar, blood pressure and cholesterol control.  Diabetic Labs Latest Ref Rng & Units 02/27/2016 02/09/2016 10/19/2015 06/08/2015 01/18/2015  HbA1c <5.7 % 7.3(H) - 7.4(H) 7.6(H) -  Microalbumin Not Estab. ug/mL - 25.6(H) - - 0.7  Micro/Creat Ratio 0.0 - 30.0 mg/g creat - 16.2 - - 5  Chol <200 mg/dL 204(H) - - 175 -  HDL >50 mg/dL 66 - - 65 -  Calc  LDL <100 mg/dL 125(H) - - 94 -  Triglycerides <150 mg/dL 67 - - 79 -  Creatinine 0.50 - 0.99 mg/dL 0.88 - 0.87 0.91 -   BP/Weight 02/29/2016 02/09/2016 12/14/2015 10/19/2015 08/15/2015 08/10/2015 Q000111Q  Systolic BP [id number] XX123456 123456 Q000111Q Q000111Q 0000000  Diastolic BP 82 80 71 80 58 72 71  Wt. (Lbs) 192 192 185.2 185 182.5 183 182.5  BMI 35.12 35.12 33.87 33.84 33.93 34.02 33.93   Foot/eye exam completion dates Latest Ref Rng & Units 10/19/2015 03/01/2015  Eye Exam No Retinopathy - No Retinopathy  Foot Form Completion - Done -        Hyperlipidemia LDL goal <100 Hyperlipidemia:Low fat diet discussed and encouraged.   Lipid Panel  Lab Results  Component Value Date   CHOL 204 (H) 02/27/2016   HDL 66 02/27/2016   LDLCALC 125 (H) 02/27/2016   TRIG 67 02/27/2016   CHOLHDL 3.1 02/27/2016   Refuses statin, rept in 3 months    Need for Zostavax administration After obtaining informed consent, the vaccine is  administered by LPN.

## 2016-03-01 ENCOUNTER — Other Ambulatory Visit: Payer: Self-pay | Admitting: Family Medicine

## 2016-03-02 ENCOUNTER — Other Ambulatory Visit: Payer: Self-pay

## 2016-03-02 ENCOUNTER — Other Ambulatory Visit (HOSPITAL_COMMUNITY): Payer: Self-pay

## 2016-03-02 DIAGNOSIS — D509 Iron deficiency anemia, unspecified: Secondary | ICD-10-CM

## 2016-03-02 MED ORDER — MONTELUKAST SODIUM 10 MG PO TABS: ORAL_TABLET | ORAL | 1 refills | Status: DC

## 2016-03-02 MED ORDER — POLYSACCHAR IRON-FA-B12 150-1-25 MG-MG-MCG PO CAPS: 1.0000 | ORAL_CAPSULE | Freq: Every day | ORAL | 1 refills | Status: DC

## 2016-03-02 NOTE — Telephone Encounter (Signed)
Received refill request from patients pharmacy for Iron pill. Chart checked and refilled.

## 2016-03-07 ENCOUNTER — Ambulatory Visit: Payer: BLUE CROSS/BLUE SHIELD | Admitting: Family Medicine

## 2016-03-07 ENCOUNTER — Telehealth: Payer: Self-pay

## 2016-03-07 DIAGNOSIS — E790 Hyperuricemia without signs of inflammatory arthritis and tophaceous disease: Secondary | ICD-10-CM

## 2016-03-07 NOTE — Telephone Encounter (Signed)
-----   Message from [name], MD sent at 03/01/2016 10:32 AM EST ----- pls add uric acid level to labs she already has ordered, thanks

## 2016-03-13 ENCOUNTER — Other Ambulatory Visit: Payer: Self-pay

## 2016-03-13 MED ORDER — ALLOPURINOL 300 MG PO TABS: 300.0000 mg | ORAL_TABLET | Freq: Every day | ORAL | 4 refills | Status: DC

## 2016-03-16 ENCOUNTER — Telehealth: Payer: Self-pay

## 2016-03-16 ENCOUNTER — Other Ambulatory Visit: Payer: Self-pay | Admitting: Family Medicine

## 2016-03-16 MED ORDER — BENZONATATE 100 MG PO CAPS: 100.0000 mg | ORAL_CAPSULE | Freq: Two times a day (BID) | ORAL | 0 refills | Status: DC | PRN

## 2016-03-16 NOTE — Telephone Encounter (Signed)
Michaela Peterson is needing a phone call back from a nurse regarding medication, please advise?

## 2016-03-16 NOTE — Telephone Encounter (Signed)
Pend tessalon for day and night use.

## 2016-03-19 ENCOUNTER — Telehealth: Payer: Self-pay | Admitting: Family Medicine

## 2016-03-19 NOTE — Telephone Encounter (Signed)
C/o bad cough with yellow mucos, when she coughs her bowels move, she has chills, nose running , symptoms since Thursday Evening, please adivse?

## 2016-03-19 NOTE — Telephone Encounter (Signed)
Spoke with patient and offered appointment.  She will call office back as she needs to arrange transportation.

## 2016-03-19 NOTE — Telephone Encounter (Signed)
Patient scheduled for 1/9

## 2016-03-20 ENCOUNTER — Ambulatory Visit (HOSPITAL_COMMUNITY)
Admission: RE | Admit: 2016-03-20 | Discharge: 2016-03-20 | Disposition: A | Discharge status: Home / Self Care | Payer: BLUE CROSS/BLUE SHIELD | Source: Ambulatory Visit | Attending: Family Medicine | Admitting: Family Medicine

## 2016-03-20 ENCOUNTER — Other Ambulatory Visit: Payer: Self-pay | Admitting: Family Medicine

## 2016-03-20 ENCOUNTER — Encounter: Payer: Self-pay | Admitting: Family Medicine

## 2016-03-20 ENCOUNTER — Ambulatory Visit (INDEPENDENT_AMBULATORY_CARE_PROVIDER_SITE_OTHER): Payer: BLUE CROSS/BLUE SHIELD | Admitting: Family Medicine

## 2016-03-20 VITALS — BP 150/80 | HR 59 | Temp 99.1°F | Resp 16 | Ht 62.0 in | Wt 185.8 lb

## 2016-03-20 DIAGNOSIS — R05 Cough: Secondary | ICD-10-CM | POA: Diagnosis not present

## 2016-03-20 DIAGNOSIS — J209 Acute bronchitis, unspecified: Secondary | ICD-10-CM

## 2016-03-20 DIAGNOSIS — R0989 Other specified symptoms and signs involving the circulatory and respiratory systems: Secondary | ICD-10-CM | POA: Diagnosis not present

## 2016-03-20 DIAGNOSIS — I10 Essential (primary) hypertension: Secondary | ICD-10-CM | POA: Diagnosis not present

## 2016-03-20 DIAGNOSIS — R938 Abnormal findings on diagnostic imaging of other specified body structures: Secondary | ICD-10-CM | POA: Diagnosis not present

## 2016-03-20 DIAGNOSIS — E1169 Type 2 diabetes mellitus with other specified complication: Secondary | ICD-10-CM

## 2016-03-20 DIAGNOSIS — G35 Multiple sclerosis: Secondary | ICD-10-CM | POA: Diagnosis not present

## 2016-03-20 DIAGNOSIS — E669 Obesity, unspecified: Secondary | ICD-10-CM

## 2016-03-20 DIAGNOSIS — R9389 Abnormal findings on diagnostic imaging of other specified body structures: Secondary | ICD-10-CM

## 2016-03-20 MED ORDER — PREDNISONE 5 MG PO TABS: 5.0000 mg | ORAL_TABLET | Freq: Two times a day (BID) | ORAL | 0 refills | Status: AC

## 2016-03-20 MED ORDER — LEVOFLOXACIN 500 MG PO TABS
500.0000 mg | ORAL_TABLET | Freq: Every day | ORAL | 0 refills | Status: DC
Start: 2016-03-20 — End: 2016-05-11

## 2016-03-20 MED ORDER — PROMETHAZINE-DM 6.25-15 MG/5ML PO SYRP: ORAL_SOLUTION | ORAL | 0 refills | Status: DC

## 2016-03-20 MED ORDER — BENZONATATE 100 MG PO CAPS: 100.0000 mg | ORAL_CAPSULE | Freq: Two times a day (BID) | ORAL | 0 refills | Status: DC | PRN

## 2016-03-20 NOTE — Patient Instructions (Addendum)
F/u as before, call if you need me  Before.  You are treated for acute bronchitis. cXR today     Levaquin, tessalon perles and phenergan DM are sent to local pharmacy and 5 days of prednisone are sent in

## 2016-03-23 NOTE — Telephone Encounter (Signed)
-----   Message from [name], MD sent at 03/20/2016  2:08 PM EST ----- pls advise no definite  pneumonia seen on xray , however, she will need a follow up CXR in next 3 to 4 weeks,please order

## 2016-03-24 NOTE — Progress Notes (Signed)
Michaela Peterson     MRN: [id number]      DOB: Dec 01, 1955   HPI Michaela Peterson  1 week h/o excessive chest congestion, cough productive of thick yellow sputum, fever and chills.Chest sore from excessive cough. Increasing malaise , poor sleep due to excessive cough, and reduced  appetite. Denies sinus pressure, nasal congestion or drainage,  ear pain or sore throat. No benefit from OTC medication used. Denies polyuria, polydipsia, blurred vision , or hypoglycemic episodes.      ROS  Denies chest pains, palpitations and leg swelling Denies abdominal pain, nausea, vomiting,diarrhea or constipation.   Denies dysuria, frequency, hesitancy or incontinence. Denies joint pain, swelling and limitation in mobility. Denies headaches, seizures, numbness, or tingling. Denies depression, anxiety or insomnia. Denies skin break down or rash.   PE  BP (!) 150/80   Pulse (!) 59   Temp 99.1 F (37.3 C) (Oral)   Resp 16   Ht 5\' 2"  (1.575 m)   Wt 185 lb 12.8 oz (84.3 kg)   LMP 08/18/2012   SpO2 97%   BMI 33.98 kg/m   Patient alert and oriented, ill appearing and in mild  cardiopulmonary distress.  HEENT: No facial asymmetry, EOMI,   oropharynx pink and moist.  Neck supple no JVD, no mass.  Chest: decreased air entry,  Crackles in mid lung, no wheezes.  CVS: S1, S2 no murmurs, no S3.Regular rate.  ABD: Soft non tender.   Ext: No edema  MS: Adequate ROM spine, shoulders, hips and knees.  Skin: Intact, no ulcerations or rash noted.  Psych: Good eye contact, normal affect. Memory intact not anxious or depressed appearing.  CNS: CN 2-12 intact, power,  normal throughout.no focal deficits noted.   Assessment & Plan  Acute bronchitis Decongestant , antibiotic and cough suppressant prescribed, CXR shows new atelectasis, rept image needed  Hypertension goal BP (blood pressure) < 140/90 Elevated, however, pt has been using oTC decongestants  DASH diet and commitment to daily physical  activity for a minimum of 30 minutes discussed and encouraged, as a part of hypertension management. The importance of attaining a healthy weight is also discussed.  BP/Weight 03/20/2016 02/29/2016 02/09/2016 12/14/2015 10/19/2015 08/15/2015 0000000  Systolic BP Q000111Q [id number] XX123456 123456 Q000111Q Q000111Q  Diastolic BP 80 82 80 71 80 58 72  Wt. (Lbs) 185.8 192 192 185.2 185 182.5 183  BMI 33.98 35.12 35.12 33.87 33.84 33.93 34.02     No med chnage  Diabetes mellitus type 2 in obese [facility]) Michaela Peterson is reminded of the importance of commitment to daily physical activity for 30 minutes or more, as able and the need to limit carbohydrate intake to 30 to 60 grams per meal to help with blood sugar control.   The need to take medication as prescribed, test blood sugar as directed, and to call between visits if there is a concern that blood sugar is uncontrolled is also discussed.   Michaela Peterson is reminded of the importance of daily foot exam, annual eye examination, and good blood sugar, blood pressure and cholesterol control. Controlled, no change in medication   Diabetic Labs Latest Ref Rng & Units 02/27/2016 02/09/2016 10/19/2015 06/08/2015 01/18/2015  HbA1c <5.7 % 7.3(H) - 7.4(H) 7.6(H) -  Microalbumin Not Estab. ug/mL - 25.6(H) - - 0.7  Micro/Creat Ratio 0.0 - 30.0 mg/g creat - 16.2 - - 5  Chol <200 mg/dL 204(H) - - 175 -  HDL >50 mg/dL 66 - - 65 -  Calc LDL <100 mg/dL 125(H) - - 94 -  Triglycerides <150 mg/dL 67 - - 79 -  Creatinine 0.50 - 0.99 mg/dL 0.88 - 0.87 0.91 -   BP/Weight 03/20/2016 02/29/2016 02/09/2016 12/14/2015 10/19/2015 08/15/2015 0000000  Systolic BP Q000111Q [id number] XX123456 123456 Q000111Q Q000111Q  Diastolic BP 80 82 80 71 80 58 72  Wt. (Lbs) 185.8 192 192 185.2 185 182.5 183  BMI 33.98 35.12 35.12 33.87 33.84 33.93 34.02   Foot/eye exam completion dates Latest Ref Rng & Units 10/19/2015 03/01/2015  Eye Exam No Retinopathy - No Retinopathy  Foot Form Completion - Done -        MULTIPLE SCLEROSIS Stable off  of medication followed by neurology annually

## 2016-03-24 NOTE — Assessment & Plan Note (Signed)
Michaela Peterson is reminded of the importance of commitment to daily physical activity for 30 minutes or more, as able and the need to limit carbohydrate intake to 30 to 60 grams per meal to help with blood sugar control.   The need to take medication as prescribed, test blood sugar as directed, and to call between visits if there is a concern that blood sugar is uncontrolled is also discussed.   Michaela Peterson is reminded of the importance of daily foot exam, annual eye examination, and good blood sugar, blood pressure and cholesterol control. Controlled, no change in medication   Diabetic Labs Latest Ref Rng & Units 02/27/2016 02/09/2016 10/19/2015 06/08/2015 01/18/2015  HbA1c <5.7 % 7.3(H) - 7.4(H) 7.6(H) -  Microalbumin Not Estab. ug/mL - 25.6(H) - - 0.7  Micro/Creat Ratio 0.0 - 30.0 mg/g creat - 16.2 - - 5  Chol <200 mg/dL 204(H) - - 175 -  HDL >50 mg/dL 66 - - 65 -  Calc LDL <100 mg/dL 125(H) - - 94 -  Triglycerides <150 mg/dL 67 - - 79 -  Creatinine 0.50 - 0.99 mg/dL 0.88 - 0.87 0.91 -   BP/Weight 03/20/2016 02/29/2016 02/09/2016 12/14/2015 10/19/2015 08/15/2015 0000000  Systolic BP Q000111Q [id number] XX123456 123456 Q000111Q Q000111Q  Diastolic BP 80 82 80 71 80 58 72  Wt. (Lbs) 185.8 192 192 185.2 185 182.5 183  BMI 33.98 35.12 35.12 33.87 33.84 33.93 34.02   Foot/eye exam completion dates Latest Ref Rng & Units 10/19/2015 03/01/2015  Eye Exam No Retinopathy - No Retinopathy  Foot Form Completion - Done -

## 2016-03-24 NOTE — Assessment & Plan Note (Signed)
Stable off of medication followed by neurology annually

## 2016-03-24 NOTE — Assessment & Plan Note (Signed)
Elevated, however, pt has been using oTC decongestants  DASH diet and commitment to daily physical activity for a minimum of 30 minutes discussed and encouraged, as a part of hypertension management. The importance of attaining a healthy weight is also discussed.  BP/Weight 03/20/2016 02/29/2016 02/09/2016 12/14/2015 10/19/2015 08/15/2015 0000000  Systolic BP Q000111Q [id number] XX123456 123456 Q000111Q Q000111Q  Diastolic BP 80 82 80 71 80 58 72  Wt. (Lbs) 185.8 192 192 185.2 185 182.5 183  BMI 33.98 35.12 35.12 33.87 33.84 33.93 34.02     No med chnage

## 2016-03-24 NOTE — Assessment & Plan Note (Signed)
Decongestant , antibiotic and cough suppressant prescribed, CXR shows new atelectasis, rept image needed

## 2016-04-03 LAB — HM DIABETES EYE EXAM

## 2016-04-18 ENCOUNTER — Ambulatory Visit (HOSPITAL_COMMUNITY)
Admission: RE | Admit: 2016-04-18 | Discharge: 2016-04-18 | Disposition: A | Discharge status: Home / Self Care | Payer: BLUE CROSS/BLUE SHIELD | Source: Ambulatory Visit | Attending: Family Medicine | Admitting: Family Medicine

## 2016-04-18 DIAGNOSIS — R0989 Other specified symptoms and signs involving the circulatory and respiratory systems: Secondary | ICD-10-CM | POA: Insufficient documentation

## 2016-04-18 DIAGNOSIS — R938 Abnormal findings on diagnostic imaging of other specified body structures: Secondary | ICD-10-CM | POA: Diagnosis present

## 2016-04-18 DIAGNOSIS — J209 Acute bronchitis, unspecified: Secondary | ICD-10-CM

## 2016-04-18 DIAGNOSIS — R05 Cough: Secondary | ICD-10-CM | POA: Diagnosis present

## 2016-04-18 DIAGNOSIS — R9389 Abnormal findings on diagnostic imaging of other specified body structures: Secondary | ICD-10-CM

## 2016-04-26 ENCOUNTER — Telehealth: Payer: Self-pay

## 2016-04-27 NOTE — Telephone Encounter (Signed)
Pt called inquiring on how often she should take her allopurinol, clarified with pt its to be daily.

## 2016-04-30 ENCOUNTER — Other Ambulatory Visit: Payer: Self-pay

## 2016-04-30 MED ORDER — VITAMIN D (ERGOCALCIFEROL) 1.25 MG (50000 UNIT) PO CAPS: 50000.0000 [IU] | ORAL_CAPSULE | ORAL | 1 refills | Status: DC

## 2016-05-10 ENCOUNTER — Telehealth: Payer: Self-pay

## 2016-05-10 NOTE — Telephone Encounter (Signed)
If appt open tomorrow pls work in there, if not and fever etc urgent care, if now apppt nesxt week when open

## 2016-05-10 NOTE — Telephone Encounter (Signed)
Called and scheduled patient to be seen 3/2 by Dr. Meda Coffee

## 2016-05-11 ENCOUNTER — Encounter: Payer: Self-pay | Admitting: Family Medicine

## 2016-05-11 ENCOUNTER — Ambulatory Visit (INDEPENDENT_AMBULATORY_CARE_PROVIDER_SITE_OTHER): Payer: BLUE CROSS/BLUE SHIELD | Admitting: Family Medicine

## 2016-05-11 VITALS — BP 128/76 | HR 88 | Temp 98.0°F | Resp 18 | Ht 62.0 in

## 2016-05-11 DIAGNOSIS — R070 Pain in throat: Secondary | ICD-10-CM

## 2016-05-11 DIAGNOSIS — J309 Allergic rhinitis, unspecified: Secondary | ICD-10-CM | POA: Diagnosis not present

## 2016-05-11 LAB — POCT RAPID STREP A (OFFICE): Rapid Strep A Screen: NEGATIVE

## 2016-05-11 MED ORDER — TRIAMCINOLONE ACETONIDE 55 MCG/ACT NA AERO: 2.0000 | INHALATION_SPRAY | Freq: Every day | NASAL | 0 refills | Status: DC

## 2016-05-11 MED ORDER — CETIRIZINE HCL 10 MG PO TABS
10.0000 mg | ORAL_TABLET | Freq: Every day | ORAL | 0 refills | Status: DC
Start: 2016-05-11 — End: 2018-02-19

## 2016-05-11 NOTE — Patient Instructions (Addendum)
Strep test is negative  Hold the Singulair and try cetirizine daily  Try the nasacort instead of the patanol  Drink plenty of fluids  Call if not better in a few days

## 2016-05-11 NOTE — Progress Notes (Signed)
Chief Complaint  Patient presents with  . Sore Throat    x 1 week  chronic allergies Lives with a smoker Feels like she cannot get rid of her respiratory symptoms of stuffy nose, sore throat, itch ears.  Off an on all winter.  Treated with antibiotics and prednisone twice.  Is on singulair.  Doesn't think it works.  [place] a Hx for patanol nasal.  Does not use.  Today with sore throat.  Some sinus pressure.  Itchy ears.  No ear pain.  No cough.  No purulent sputum.  Patient Active Problem List   Diagnosis Date Noted  . Diabetes mellitus type 2 in obese ([place]) 02/29/2016  . Thrombocytosis ([place]) 06/08/2015  . Constipation 02/09/2015  . Abnormal CT scan, chest 10/19/2013  . Tubular adenoma 09/17/2013  . IDA (iron deficiency anemia) 09/17/2013  . Hyperlipidemia LDL goal <100 02/02/2011  . Acute bronchitis 12/18/2010  . Pulmonary hypertension 06/26/2010  . BACK PAIN WITH RADICULOPATHY 08/01/2009  . Nonalcoholic fatty liver disease 07/13/2009  . Obesity, diabetes, and hypertension syndrome ([place]) 03/12/2009  . GERD 11/01/2008  . GASTROPARESIS 11/01/2008  . MULTIPLE SCLEROSIS 06/16/2007  . Obstructive sleep apnea 05/16/2007  . Hypertension goal BP (blood pressure) < 140/90 04/20/2007  . Allergic rhinitis 04/18/2007    Outpatient Encounter Prescriptions as of 05/11/2016  Medication Sig  . allopurinol (ZYLOPRIM) 300 MG tablet Take 1 tablet (300 mg total) by mouth daily.  . clobetasol (TEMOVATE) 0.05 % external solution APPLY TO AFFECTED AREA ON SCALP TWICE A DAY (NOT TO FACE, GROIN OR UNDERARMS)  . cycloSPORINE (RESTASIS) 0.05 % ophthalmic emulsion Place 1 drop into both eyes 2 (two) times daily.   Marland Kitchen docusate sodium (COLACE) 50 MG capsule Take 50 mg by mouth daily as needed for mild constipation.   Marland Kitchen FARXIGA 10 MG TABS tablet TAKE 1 TABLET BY MOUTH EVERY DAY AT BREAKFAST  . glipiZIDE (GLUCOTROL) 10 MG tablet TAKE 1 TABLET (10 MG TOTAL) BY MOUTH 2 (TWO) TIMES DAILY BEFORE A MEAL.  Marland Kitchen  ibuprofen (ADVIL,MOTRIN) 200 MG tablet Take 200 mg by mouth every 6 (six) hours as needed for moderate pain.   . metFORMIN (GLUCOPHAGE) 1000 MG tablet TAKE 1 TABLET TWICE A DAY WITH MEALS  . montelukast (SINGULAIR) 10 MG tablet TAKE 1 TABLET (10 MG TOTAL) BY MOUTH AT BEDTIME.  . mupirocin ointment (BACTROBAN) 2 % Apply 1 application topically 2 (two) times daily.  Marland Kitchen NIFEdipine (ADALAT CC) 90 MG 24 hr tablet Take 1 tablet (90 mg total) by mouth daily.  . Olopatadine HCl (PATANASE) 0.6 % SOLN As directed (Patient taking differently: Place 1 puff into both nostrils daily as needed (congestion). As directed)  . pantoprazole (PROTONIX) 40 MG tablet TAKE 1 TABLET (40 MG TOTAL) BY MOUTH 2 (TWO) TIMES DAILY BEFORE A MEAL.  Marland Kitchen Polysacchar Iron-FA-B12 (FERREX 150 FORTE) 150-1-25 MG-MG-MCG CAPS Take 1 capsule by mouth daily.  Marland Kitchen Propylene Glycol (SYSTANE BALANCE OP) Apply 1 drop to eye daily. Reported on 07/26/2015  . RELION CONFIRM/MICRO TEST test strip USE STRIPS TO TEST BLOOD SUGAR TWICE DAILY  . terbinafine (LAMISIL) 250 MG tablet TAKE 1 TABLET (250 MG TOTAL) BY MOUTH DAILY.  Marland Kitchen triamterene-hydrochlorothiazide (MAXZIDE-25) 37.5-25 MG tablet Take 1 tablet by mouth daily.  . Vitamin D, Ergocalciferol, (DRISDOL) 50000 units CAPS capsule Take 1 capsule (50,000 Units total) by mouth every 7 (seven) days.  . cetirizine (ZYRTEC) 10 MG tablet Take 1 tablet (10 mg total) by mouth daily.  Marland Kitchen  triamcinolone (NASACORT AQ) 55 MCG/ACT AERO nasal inhaler Place 2 sprays into the nose daily.  . [DISCONTINUED] benzonatate (TESSALON) 100 MG capsule Take 1 capsule (100 mg total) by mouth 2 (two) times daily as needed for cough. (Patient not taking: Reported on 05/11/2016)  . [DISCONTINUED] levofloxacin (LEVAQUIN) 500 MG tablet Take 1 tablet (500 mg total) by mouth daily. (Patient not taking: Reported on 05/11/2016)  . [DISCONTINUED] promethazine-dextromethorphan (PROMETHAZINE-DM) 6.25-15 MG/5ML syrup One teaspoon twice daily as needed,  for excess cough (Patient not taking: Reported on 05/11/2016)   No facility-administered encounter medications on file as of 05/11/2016.     Allergies  Allergen Reactions  . Ace Inhibitors Cough  . Aspirin     REACTION: unknown reaction  . Oxycodone-Acetaminophen Hives  . Penicillins Hives  . Statins     NAFLD    Review of Systems  Constitutional: Negative for activity change, appetite change, chills and fever.  HENT: Positive for postnasal drip, sore throat and voice change. Negative for congestion, sinus pain and sinus pressure.   Eyes: Negative for redness and visual disturbance.  Respiratory: Negative for cough and shortness of breath.   Cardiovascular: Negative for chest pain and palpitations.  Gastrointestinal: Negative for nausea and vomiting.  Neurological: Negative for dizziness and headaches.   BP 128/76 (BP Location: Right Arm, Patient Position: Sitting, Cuff Size: Normal)   Pulse 88   Temp 98 F (36.7 C) (Temporal)   Resp 18   Ht 5\' 2"  (1.575 m)   LMP 08/18/2012   SpO2 97%   Physical Exam  Constitutional: She appears well-developed and well-nourished. No distress.  HENT:  Head: Normocephalic and atraumatic.  Right Ear: External ear normal.  Left Ear: External ear normal.  Nose: Nose normal.  Mouth/Throat: Oropharynx is clear and moist.  Post pharynx slightly red.  STREP negative  Eyes: EOM are normal. Pupils are equal, round, and reactive to light.  Neck: Normal range of motion. Neck supple.  Tender neck but no nodes  Cardiovascular: Normal rate and regular rhythm.   Murmur heard. Pulmonary/Chest: Effort normal and breath sounds normal. She has no wheezes.  Lymphadenopathy:    She has no cervical adenopathy.    ASSESSMENT/PLAN:  1. Throat pain in adult  - POCT rapid strep A  2. Chronic allergic rhinitis, unspecified seasonality, unspecified trigger    Patient Instructions  Strep test is negative  Hold the Singulair and try cetirizine  daily  Try the nasacort instead of the patanol  Drink plenty of fluids  Call if not better in a few days     Raylene Everts, MD

## 2016-06-06 ENCOUNTER — Telehealth: Payer: Self-pay | Admitting: Family Medicine

## 2016-06-06 LAB — COMPLETE METABOLIC PANEL WITH GFR
ALT: 31 U/L — ABNORMAL HIGH (ref 6–29)
AST: 28 U/L (ref 10–35)
Albumin: 4.4 g/dL (ref 3.6–5.1)
Alkaline Phosphatase: 87 U/L (ref 33–130)
BUN: 16 mg/dL (ref 7–25)
CO2: 27 mmol/L (ref 20–31)
Calcium: 10.1 mg/dL (ref 8.6–10.4)
Chloride: 105 mmol/L (ref 98–110)
Creat: 0.92 mg/dL (ref 0.50–0.99)
GFR, Est African American: 78 mL/min (ref >=60)
GFR, Est Non African American: 68 mL/min (ref >=60)
Glucose, Bld: 134 mg/dL — ABNORMAL HIGH (ref 65–99)
Potassium: 3.9 mmol/L (ref 3.5–5.3)
Sodium: 139 mmol/L (ref 135–146)
Total Bilirubin: 0.4 mg/dL (ref 0.2–1.2)
Total Protein: 7.4 g/dL (ref 6.1–8.1)

## 2016-06-06 LAB — LIPID PANEL
Cholesterol: 170 mg/dL (ref <200)
HDL: 67 mg/dL (ref >50)
LDL Cholesterol: 89 mg/dL (ref <100)
Total CHOL/HDL Ratio: 2.5 Ratio (ref <5.0)
Triglycerides: 69 mg/dL (ref <150)
VLDL: 14 mg/dL (ref <30)

## 2016-06-06 NOTE — Telephone Encounter (Signed)
Patient misplaced her order for blood work, requesting another copy.   cb#: [phone]

## 2016-06-06 NOTE — Telephone Encounter (Signed)
faxed

## 2016-06-07 DIAGNOSIS — M170 Bilateral primary osteoarthritis of knee: Secondary | ICD-10-CM | POA: Diagnosis not present

## 2016-06-07 LAB — HEMOGLOBIN A1C
Hgb A1c MFr Bld: 7.4 % — ABNORMAL HIGH (ref <5.7)
Mean Plasma Glucose: 166 mg/dL

## 2016-06-07 LAB — URIC ACID: Uric Acid, Serum: 4.4 mg/dL (ref 2.5–7.0)

## 2016-06-11 ENCOUNTER — Encounter: Payer: Self-pay | Admitting: Family Medicine

## 2016-06-11 ENCOUNTER — Ambulatory Visit (INDEPENDENT_AMBULATORY_CARE_PROVIDER_SITE_OTHER): Payer: Medicare Other | Admitting: Family Medicine

## 2016-06-11 VITALS — BP 132/80 | HR 71 | Resp 15 | Ht 62.0 in | Wt 185.0 lb

## 2016-06-11 DIAGNOSIS — E669 Obesity, unspecified: Secondary | ICD-10-CM

## 2016-06-11 DIAGNOSIS — I10 Essential (primary) hypertension: Secondary | ICD-10-CM

## 2016-06-11 DIAGNOSIS — M7989 Other specified soft tissue disorders: Secondary | ICD-10-CM | POA: Diagnosis not present

## 2016-06-11 DIAGNOSIS — E119 Type 2 diabetes mellitus without complications: Secondary | ICD-10-CM

## 2016-06-11 DIAGNOSIS — E1159 Type 2 diabetes mellitus with other circulatory complications: Secondary | ICD-10-CM | POA: Diagnosis not present

## 2016-06-11 DIAGNOSIS — G4733 Obstructive sleep apnea (adult) (pediatric): Secondary | ICD-10-CM | POA: Diagnosis not present

## 2016-06-11 DIAGNOSIS — Z9189 Other specified personal risk factors, not elsewhere classified: Secondary | ICD-10-CM

## 2016-06-11 DIAGNOSIS — E785 Hyperlipidemia, unspecified: Secondary | ICD-10-CM

## 2016-06-11 DIAGNOSIS — I152 Hypertension secondary to endocrine disorders: Secondary | ICD-10-CM

## 2016-06-11 DIAGNOSIS — E1169 Type 2 diabetes mellitus with other specified complication: Secondary | ICD-10-CM

## 2016-06-11 MED ORDER — METFORMIN HCL 1000 MG PO TABS: 1000.0000 mg | ORAL_TABLET | Freq: Two times a day (BID) | ORAL | 0 refills | Status: DC

## 2016-06-11 MED ORDER — ALLOPURINOL 100 MG PO TABS: 100.0000 mg | ORAL_TABLET | Freq: Every day | ORAL | 1 refills | Status: DC

## 2016-06-11 NOTE — Patient Instructions (Addendum)
Welcome to [facility] August 15 or after, with MD in  4 months, call if you need me before   Labs are very good, congrats on improved cholesterol  You are referred to cardiologist for evaluation of heart disease and intermittent  Leg swelling, andintermittent chest discomfort PLS stop drinking sweet drinks, farxiga is not being refilled as you requested  You are referred to pulmonary Doc re sleep apnea  Non fasting hBA1C , chem 7 and EGFR, cBC, TSH  And vit D in 4 month, after August 10  Take daily vit D3 2000 IU and coated aspirin 81 mg one daily  Goal for fasting blood sugar ranges from 80 to 120 and 2 hours after any meal or at bedtime should be between 130 to 170.  Thank you  for choosing [facility]. We consider it a privelige to serve you.  Delivering excellent health care in a caring and  compassionate way is our goal.  Partnering with you,  so that together we can achieve this goal is our strategy.

## 2016-06-12 ENCOUNTER — Other Ambulatory Visit: Payer: Self-pay | Admitting: Family Medicine

## 2016-06-17 ENCOUNTER — Encounter: Payer: Self-pay | Admitting: Family Medicine

## 2016-06-17 DIAGNOSIS — Z9189 Other specified personal risk factors, not elsewhere classified: Secondary | ICD-10-CM

## 2016-06-17 HISTORY — DX: Other specified personal risk factors, not elsewhere classified: Z91.89

## 2016-06-17 NOTE — Assessment & Plan Note (Signed)
New c/o intermittent chest discomfort and fatigue in high risk pt , refer for cardiology eval Improved control of chronic underlying pathology also stressed

## 2016-06-17 NOTE — Assessment & Plan Note (Signed)
Controlled, no change in medication Michaela Peterson is reminded of the importance of commitment to daily physical activity for 30 minutes or more, as able and the need to limit carbohydrate intake to 30 to 60 grams per meal to help with blood sugar control.   The need to take medication as prescribed, test blood sugar as directed, and to call between visits if there is a concern that blood sugar is uncontrolled is also discussed.   Michaela Peterson is reminded of the importance of daily foot exam, annual eye examination, and good blood sugar, blood pressure and cholesterol control.  Diabetic Labs Latest Ref Rng & Units 06/06/2016 02/27/2016 02/09/2016 10/19/2015 06/08/2015  HbA1c <5.7 % 7.4(H) 7.3(H) - 7.4(H) 7.6(H)  Microalbumin Not Estab. ug/mL - - 25.6(H) - -  Micro/Creat Ratio 0.0 - 30.0 mg/g creat - - 16.2 - -  Chol <200 mg/dL 170 204(H) - - 175  HDL >50 mg/dL 67 66 - - 65  Calc LDL <100 mg/dL 89 125(H) - - 94  Triglycerides <150 mg/dL 69 67 - - 79  Creatinine 0.50 - 0.99 mg/dL 0.92 0.88 - 0.87 0.91   BP/Weight 06/11/2016 05/11/2016 03/20/2016 02/29/2016 02/09/2016 83/06/3733 09/17/9782  Systolic BP 784 128 [phone] 747  Diastolic BP 80 76 80 82 80 71 80  Wt. (Lbs) 185 - 185.8 192 192 185.2 185  BMI 33.84 - 33.98 35.12 35.12 33.87 33.84   Foot/eye exam completion dates Latest Ref Rng & Units 04/03/2016 10/19/2015  Eye Exam No Retinopathy No Retinopathy -  Foot Form Completion - - Done

## 2016-06-17 NOTE — Assessment & Plan Note (Signed)
Symptomatic , has fatigue , needs re vealuation and to resume CPAP, refer for re  eval

## 2016-06-17 NOTE — Assessment & Plan Note (Signed)
Unchanged Patient re-educated about  the importance of commitment to a  minimum of 150 minutes of exercise per week.  The importance of healthy food choices with portion control discussed. Encouraged to start a food diary, count calories and to consider  joining a support group. Sample diet sheets offered. Goals set by the patient for the next several months.   Weight /BMI 06/11/2016 05/11/2016 03/20/2016  WEIGHT 185 lb - 185 lb 12.8 oz  HEIGHT 5\' 2"  5\' 2"  5\' 2"   BMI 33.84 kg/m2 - 33.98 kg/m2

## 2016-06-17 NOTE — Progress Notes (Signed)
Michaela Peterson     MRN: [id number]      DOB: 12/29/1955   HPI Michaela Peterson is here for follow up and re-evaluation of chronic medical conditions, medication management and review of any available recent lab and radiology data.  Preventive health is updated, specifically  Cancer screening and Immunization.   Questions or concerns regarding consultations or procedures which the PT has had in the interim are  addressed. The PT denies any adverse reactions to current medications since the last visit.  c/o intermittent chest discomfort with leg swelling  And multiple CV risk factors, needs cardiology eval in light of co morbidities c/o chronic fatigue , excess snoring needs sleep eval, has not been using machine for a long time Denies polyuria, polydipsia, blurred vision , or hypoglycemic episodes.   ROS Denies recent fever or chills. Denies sinus pressure, nasal congestion, ear pain or sore throat. Denies chest congestion, productive cough or wheezing. Denies abdominal pain, nausea, vomiting,diarrhea or constipation.   Denies dysuria, frequency, hesitancy or incontinence. Denies joint pain, swelling and limitation in mobility. Denies headaches, seizures, numbness, or tingling. Denies depression, anxiety or insomnia. Denies skin break down or rash.   PE  BP 132/80   Pulse 71   Resp 15   Ht 5\' 2"  (1.575 m)   Wt 185 lb (83.9 kg)   LMP 08/18/2012   SpO2 98%   BMI 33.84 kg/m   Patient alert and oriented and in no cardiopulmonary distress.  HEENT: No facial asymmetry, EOMI,   oropharynx pink and moist.  Neck supple no JVD, no mass.  Chest: Clear to auscultation bilaterally.  CVS: S1, S2 no murmurs, no S3.Regular rate.  ABD: Soft non tender.   Ext: one plus pitting  Edema bilterally  MS: Adequate ROM spine, shoulders, hips and knees.  Skin: Intact, no ulcerations or rash noted.  Psych: Good eye contact, normal affect. Memory intact not anxious or depressed appearing.  CNS:  CN 2-12 intact, power,  normal throughout.no focal deficits noted.   Assessment & Plan  Diabetes mellitus type 2 in obese ([facility]) Controlled, no change in medication Michaela Peterson is reminded of the importance of commitment to daily physical activity for 30 minutes or more, as able and the need to limit carbohydrate intake to 30 to 60 grams per meal to help with blood sugar control.   The need to take medication as prescribed, test blood sugar as directed, and to call between visits if there is a concern that blood sugar is uncontrolled is also discussed.   Michaela Peterson is reminded of the importance of daily foot exam, annual eye examination, and good blood sugar, blood pressure and cholesterol control.  Diabetic Labs Latest Ref Rng & Units 06/06/2016 02/27/2016 02/09/2016 10/19/2015 06/08/2015  HbA1c <5.7 % 7.4(H) 7.3(H) - 7.4(H) 7.6(H)  Microalbumin Not Estab. ug/mL - - 25.6(H) - -  Micro/Creat Ratio 0.0 - 30.0 mg/g creat - - 16.2 - -  Chol <200 mg/dL 170 204(H) - - 175  HDL >50 mg/dL 67 66 - - 65  Calc LDL <100 mg/dL 89 125(H) - - 94  Triglycerides <150 mg/dL 69 67 - - 79  Creatinine 0.50 - 0.99 mg/dL 0.92 0.88 - 0.87 0.91   BP/Weight 06/11/2016 05/11/2016 03/20/2016 02/29/2016 02/09/2016 38/03/173 1/0/2585  Systolic BP 277 824 235 361 443 154 008  Diastolic BP 80 76 80 82 80 71 80  Wt. (Lbs) 185 - 185.8 192 192 185.2 185  BMI 33.84 - 33.98  35.12 35.12 33.87 33.84   Foot/eye exam completion dates Latest Ref Rng & Units 04/03/2016 10/19/2015  Eye Exam No Retinopathy No Retinopathy -  Foot Form Completion - - Done        Hypertension goal BP (blood pressure) < 140/90 Controlled, no change in medication DASH diet and commitment to daily physical activity for a minimum of 30 minutes discussed and encouraged, as a part of hypertension management. The importance of attaining a healthy weight is also discussed.  BP/Weight 06/11/2016 05/11/2016 03/20/2016 02/29/2016 02/09/2016 80/05/2120 06/17/2498  Systolic  BP 370 488 891 694 503 888 280  Diastolic BP 80 76 80 82 80 71 80  Wt. (Lbs) 185 - 185.8 192 192 185.2 185  BMI 33.84 - 33.98 35.12 35.12 33.87 33.84       Hyperlipidemia LDL goal <100 Hyperlipidemia:Low fat diet discussed and encouraged.   Lipid Panel  Lab Results  Component Value Date   CHOL 170 06/06/2016   HDL 67 06/06/2016   LDLCALC 89 06/06/2016   TRIG 69 06/06/2016   CHOLHDL 2.5 06/06/2016   Controlled, no change in medication   Obesity, diabetes, and hypertension syndrome Unchanged Patient re-educated about  the importance of commitment to a  minimum of 150 minutes of exercise per week.  The importance of healthy food choices with portion control discussed. Encouraged to start a food diary, count calories and to consider  joining a support group. Sample diet sheets offered. Goals set by the patient for the next several months.   Weight /BMI 06/11/2016 05/11/2016 03/20/2016  WEIGHT 185 lb - 185 lb 12.8 oz  HEIGHT 5\' 2"  5\' 2"  5\' 2"   BMI 33.84 kg/m2 - 33.98 kg/m2      Obstructive sleep apnea Symptomatic , has fatigue , needs re vealuation and to resume CPAP, refer for re  eval  At risk for cardiovascular event New c/o intermittent chest discomfort and fatigue in high risk pt , refer for cardiology eval Improved control of chronic underlying pathology also stressed

## 2016-06-17 NOTE — Assessment & Plan Note (Signed)
Controlled, no change in medication DASH diet and commitment to daily physical activity for a minimum of 30 minutes discussed and encouraged, as a part of hypertension management. The importance of attaining a healthy weight is also discussed.  BP/Weight 06/11/2016 05/11/2016 03/20/2016 02/29/2016 02/09/2016 38/06/5362 08/18/319  Systolic BP 224 825 003 704 888 916 945  Diastolic BP 80 76 80 82 80 71 80  Wt. (Lbs) 185 - 185.8 192 192 185.2 185  BMI 33.84 - 33.98 35.12 35.12 33.87 33.84

## 2016-06-17 NOTE — Assessment & Plan Note (Signed)
Hyperlipidemia:Low fat diet discussed and encouraged.   Lipid Panel  Lab Results  Component Value Date   CHOL 170 06/06/2016   HDL 67 06/06/2016   LDLCALC 89 06/06/2016   TRIG 69 06/06/2016   CHOLHDL 2.5 06/06/2016   Controlled, no change in medication

## 2016-06-18 ENCOUNTER — Other Ambulatory Visit (HOSPITAL_COMMUNITY): Payer: Self-pay | Admitting: *Deleted

## 2016-06-18 DIAGNOSIS — D508 Other iron deficiency anemias: Secondary | ICD-10-CM

## 2016-06-19 ENCOUNTER — Encounter (HOSPITAL_COMMUNITY): Payer: Medicare Other

## 2016-06-19 ENCOUNTER — Encounter (HOSPITAL_COMMUNITY): Payer: Self-pay

## 2016-06-19 ENCOUNTER — Encounter (HOSPITAL_COMMUNITY): Payer: Medicare Other | Attending: Oncology | Admitting: Oncology

## 2016-06-19 VITALS — BP 121/82 | HR 79 | Temp 97.8°F | Resp 16 | Wt 183.2 lb

## 2016-06-19 DIAGNOSIS — D508 Other iron deficiency anemias: Secondary | ICD-10-CM | POA: Insufficient documentation

## 2016-06-19 DIAGNOSIS — D509 Iron deficiency anemia, unspecified: Secondary | ICD-10-CM

## 2016-06-19 DIAGNOSIS — D75839 Thrombocytosis, unspecified: Secondary | ICD-10-CM

## 2016-06-19 DIAGNOSIS — D473 Essential (hemorrhagic) thrombocythemia: Secondary | ICD-10-CM

## 2016-06-19 LAB — CBC WITH DIFFERENTIAL/PLATELET
Basophils Absolute: 0.1 10*3/uL (ref 0.0–0.1)
Basophils Relative: 1 %
Eosinophils Absolute: 0.1 10*3/uL (ref 0.0–0.7)
Eosinophils Relative: 1 %
HCT: 37.7 % (ref 36.0–46.0)
Hemoglobin: 12.7 g/dL (ref 12.0–15.0)
Lymphocytes Relative: 31 %
Lymphs Abs: 3.3 10*3/uL (ref 0.7–4.0)
MCH: 28.7 pg (ref 26.0–34.0)
MCHC: 33.7 g/dL (ref 30.0–36.0)
MCV: 85.3 fL (ref 78.0–100.0)
Monocytes Absolute: 0.3 10*3/uL (ref 0.1–1.0)
Monocytes Relative: 3 %
Neutro Abs: 6.9 10*3/uL (ref 1.7–7.7)
Neutrophils Relative %: 64 %
Platelets: 386 10*3/uL (ref 150–400)
RBC: 4.42 MIL/uL (ref 3.87–5.11)
RDW: 15.3 % (ref 11.5–15.5)
WBC: 10.6 10*3/uL — ABNORMAL HIGH (ref 4.0–10.5)

## 2016-06-19 LAB — COMPREHENSIVE METABOLIC PANEL
ALT: 51 U/L (ref 14–54)
AST: 37 U/L (ref 15–41)
Albumin: 4.4 g/dL (ref 3.5–5.0)
Alkaline Phosphatase: 91 U/L (ref 38–126)
Anion gap: 13 (ref 5–15)
BUN: 20 mg/dL (ref 6–20)
CO2: 24 mmol/L (ref 22–32)
Calcium: 9.7 mg/dL (ref 8.9–10.3)
Chloride: 103 mmol/L (ref 101–111)
Creatinine, Ser: 0.9 mg/dL (ref 0.44–1.00)
GFR calc Af Amer: >60 mL/min (ref >60)
GFR calc non Af Amer: >60 mL/min (ref >60)
Glucose, Bld: 158 mg/dL — ABNORMAL HIGH (ref 65–99)
Potassium: 3.7 mmol/L (ref 3.5–5.1)
Sodium: 140 mmol/L (ref 135–145)
Total Bilirubin: 0.7 mg/dL (ref 0.3–1.2)
Total Protein: 8.3 g/dL — ABNORMAL HIGH (ref 6.5–8.1)

## 2016-06-19 LAB — IRON AND TIBC
Iron: 36 ug/dL (ref 28–170)
Saturation Ratios: 10 % — ABNORMAL LOW (ref 10.4–31.8)
TIBC: 365 ug/dL (ref 250–450)
UIBC: 329 ug/dL

## 2016-06-19 LAB — FERRITIN: Ferritin: 91 ng/mL (ref 11–307)

## 2016-06-19 NOTE — Progress Notes (Signed)
[facility] Hematology/Oncology Progress Note  Name: Michaela Peterson      MRN: [id number]   Date: 06/19/2016 Time:10:43 AM   REFERRING PHYSICIAN:  Tula Nakayama, MD (Primary Care Provider)  REASON FOR CONSULT:  Thrombocytosis   DIAGNOSIS:  Thrombocytosis dating back to at least 2008 with preservation of WBC and HGB/RBC/HCT  HISTORY OF PRESENT ILLNESS:   Michaela Peterson is a 61 y.o. female with a medical history significant for uncontrolled DM, obesity, hepatic steatosis, Multiple Sclerosis, H/O IDA, HTN, GERD, gastroparesis, radicular pain, who returns to the [facility] for thrombocytosis.  She is feeling well today. She has b/l knee pain that has been happening for a while. She got steroid injections recently. She has been congested since November. She has started to clear up in the last couple of weeks. Denies chest pain, SOB, abdominal pain, or any other concerns.   Review of Systems  Constitutional: Negative.   HENT: Positive for congestion.   Eyes: Negative.   Respiratory: Negative.  Negative for shortness of breath.   Cardiovascular: Negative.  Negative for chest pain.  Gastrointestinal: Negative.  Negative for abdominal pain.  Genitourinary: Negative.   Musculoskeletal: Positive for joint pain (b/l knee).  Skin: Negative.   Neurological: Negative.   Endo/Heme/Allergies: Negative.   Psychiatric/Behavioral: Negative.   All other systems reviewed and are negative. 14 point review of systems was performed and is negative except as detailed under history of present illness and above  PAST MEDICAL HISTORY:   Past Medical History:  Diagnosis Date  . Adnexal mass    Left; followed by Dr. [name]  . Anemia, iron deficiency   . Arthritis   . Chronic low back pain   . Diabetes mellitus, type 2 (HCC)    A1c of 6.9 in 05/2010  . Gastroparesis   . GERD (gastroesophageal reflux disease)    GastroparesisS.;small hiatal hernia  . Hypertension   .  MS (multiple sclerosis) ([place])   . Nonalcoholic fatty liver disease   . Obesity   . Sleep apnea 2009    ALLERGIES: Allergies  Allergen Reactions  . Ace Inhibitors Cough  . Aspirin     REACTION: unknown reaction  . Oxycodone-Acetaminophen Hives  . Penicillins Hives  . Statins     NAFLD      MEDICATIONS: I have reviewed the patient's current medications.    Current Outpatient Prescriptions on File Prior to Visit  Medication Sig Dispense Refill  . allopurinol (ZYLOPRIM) 100 MG tablet Take 1 tablet (100 mg total) by mouth daily. 90 tablet 1  . cetirizine (ZYRTEC) 10 MG tablet Take 1 tablet (10 mg total) by mouth daily. 30 tablet 0  . clobetasol (TEMOVATE) 0.05 % external solution APPLY TO AFFECTED AREA ON SCALP TWICE A DAY (NOT TO FACE, GROIN OR UNDERARMS)  2  . docusate sodium (COLACE) 50 MG capsule Take 50 mg by mouth daily as needed for mild constipation.     Marland Kitchen glipiZIDE (GLUCOTROL) 10 MG tablet TAKE 1 TABLET (10 MG TOTAL) BY MOUTH 2 (TWO) TIMES DAILY BEFORE A MEAL. 180 tablet 1  . ibuprofen (ADVIL,MOTRIN) 200 MG tablet Take 200 mg by mouth every 6 (six) hours as needed for moderate pain.     . metFORMIN (GLUCOPHAGE) 1000 MG tablet Take 1 tablet (1,000 mg total) by mouth 2 (two) times daily with a meal. 60 tablet 0  . montelukast (SINGULAIR) 10 MG tablet TAKE 1 TABLET (10 MG  TOTAL) BY MOUTH AT BEDTIME. 90 tablet 1  . mupirocin ointment (BACTROBAN) 2 % Apply 1 application topically 2 (two) times daily. 30 g 1  . Olopatadine HCl (PATANASE) 0.6 % SOLN As directed (Patient taking differently: Place 1 puff into both nostrils daily as needed (congestion). As directed) 30.5 g 2  . pantoprazole (PROTONIX) 40 MG tablet TAKE 1 TABLET (40 MG TOTAL) BY MOUTH 2 (TWO) TIMES DAILY BEFORE A MEAL. 180 tablet 3  . Polysacchar Iron-FA-B12 (FERREX 150 FORTE) 150-1-25 MG-MG-MCG CAPS Take 1 capsule by mouth daily. 90 capsule 1  . Propylene Glycol (SYSTANE BALANCE OP) Apply 1 drop to eye daily. Reported on  07/26/2015    . RELION CONFIRM/MICRO TEST test strip USE STRIPS TO TEST BLOOD SUGAR TWICE DAILY 100 each 0  . triamcinolone (NASACORT AQ) 55 MCG/ACT AERO nasal inhaler Place 2 sprays into the nose daily. 1 Inhaler 0  . triamterene-hydrochlorothiazide (MAXZIDE-25) 37.5-25 MG tablet Take 1 tablet by mouth daily. 90 tablet 1   No current facility-administered medications on file prior to visit.      PAST SURGICAL HISTORY Past Surgical History:  Procedure Laterality Date  . AGILE CAPSULE N/A 08/11/2014   Procedure: AGILE CAPSULE;  Surgeon: Daneil Dolin, MD;  Location: [facility];  Service: Endoscopy;  Laterality: N/A;  0700  . BLADDER SUSPENSION    . CESAREAN SECTION     x2   . CHOLECYSTECTOMY    . COLONOSCOPY  2010   Dr. [name]: tubular adenoma, few scattered diverticula  . COLONOSCOPY N/A 10/01/2013   Dr. Pixie Casino preparation. Normal rectum. Normal colonic mucosa  . DILATION AND CURETTAGE OF UTERUS     4  . ESOPHAGOGASTRODUODENOSCOPY  2010   Dr. [name]: normal esophagus, small hiatal hernia, questionable pale duodenal mucosa but negative for celiac sprue.   . ESOPHAGOGASTRODUODENOSCOPY N/A 10/01/2013   Dr. Rourk:gastric polyps-status post biopsy. Otherwise, normal EGD. fundic gland polyp, negative H.pylori  . GIVENS CAPSULE STUDY N/A 09/08/2014   Poor prep but overall unremarkable   . KNEE ARTHROSCOPY W/ DEBRIDEMENT    . LAPAROSCOPY ABDOMEN DIAGNOSTIC     lysis of adhesions  . ROTATOR CUFF REPAIR    . TENDON LENGTHENING     Left wrist  . UMBILICAL HERNIA REPAIR      FAMILY HISTORY: Family History  Problem Relation Age of Onset  . Hypertension Sister   . Diabetes Sister   . Heart disease Sister   . Heart failure Mother   . Hypertension Mother   . Arthritis Mother   . Stroke Father   . Hypertension Son   . Colon cancer Neg Hx    Mother deceased at the age of 52 secondary to complications associated with heart disease Father deceased at the age of 86 from stroke and  complications associated with ESRD. 2 sisters, one with DM and ESRD, the other has HTN and pre-diabetic. 2 brothers, one with schizophrenia and pre-diabeters, the other with pre-diabetes 3 sons, all healthy 1 daughter, healthy  She notes a family history of malignancy in an aunt who passed away from her cancer.  She had a "cancer in the fat."  Her one sister had a "female cancer" "not ovarian."  Treated with hysterectomy, but keeping both ovaries at time of surgery.  SOCIAL HISTORY: She denies tobacco abuse (including history of), EtOH use (including history of), and illicit drug abuse.  She is a [place] and reports that she is [place].  She is on disability secondary to  MS and joint issues.  She used to work at [employer] as a Quarry manager.  She is married x 40 years.  Her 41st anniversary is this coming September.  Social History   Social History  . Marital status: Married    Spouse name: [name]   . Number of children: 4  . Years of education: 60   Occupational History  . unemployed  Unemployed   Social History Main Topics  . Smoking status: Never Smoker  . Smokeless tobacco: Never Used     Comment: patient lives with a smoker   . Alcohol use No  . Drug use: No  . Sexual activity: Yes   Other Topics Concern  . None   Social History Narrative   Patient lives at home with husband [name].    Patient has 4 children.    Patient has a some college.    Patient is right handed.    Patient is currently not working.     PERFORMANCE STATUS: The patient's performance status is 1 - Symptomatic but completely ambulatory  PHYSICAL EXAM: Most Recent Vital Signs: Blood pressure 121/82, pulse 79, temperature 97.8 F (36.6 C), temperature source Oral, resp. rate 16, weight 183 lb 3.2 oz (83.1 kg), last menstrual period 08/18/2012, SpO2 100 %.   Physical Exam  Constitutional: She is oriented to person, place, and time and well-developed, well-nourished, and in no distress. No distress.  HENT:    Head: Normocephalic and atraumatic.  Mouth/Throat: Oropharynx is clear and moist. No oropharyngeal exudate.  Eyes: Conjunctivae and EOM are normal. Pupils are equal, round, and reactive to light. No scleral icterus.  Neck: Normal range of motion. Neck supple. No JVD present.  Cardiovascular: Normal rate, regular rhythm and normal heart sounds.  Exam reveals no gallop and no friction rub.   No murmur heard. Pulmonary/Chest: Effort normal and breath sounds normal. No respiratory distress. She has no wheezes. She has no rales.  Abdominal: Soft. Bowel sounds are normal. She exhibits no distension. There is no tenderness. There is no guarding.  Musculoskeletal: Normal range of motion. She exhibits no edema or tenderness.  Lymphadenopathy:    She has no cervical adenopathy.  Neurological: She is alert and oriented to person, place, and time. No cranial nerve deficit. Gait normal.  Skin: Skin is warm and dry. No rash noted. No erythema. No pallor.  Psychiatric: Affect and judgment normal.  Nursing note and vitals reviewed.  LABORATORY DATA:  I have reviewed the data as listed. CBC    Component Value Date/Time   WBC 10.6 (H) 06/19/2016 1007   RBC 4.42 06/19/2016 1007   HGB 12.7 06/19/2016 1007   HCT 37.7 06/19/2016 1007   PLT 386 06/19/2016 1007   MCV 85.3 06/19/2016 1007   MCH 28.7 06/19/2016 1007   MCHC 33.7 06/19/2016 1007   RDW 15.3 06/19/2016 1007   LYMPHSABS 3.3 06/19/2016 1007   MONOABS 0.3 06/19/2016 1007   EOSABS 0.1 06/19/2016 1007   BASOSABS 0.1 06/19/2016 1007   CMP Latest Ref Rng & Units 06/19/2016 06/06/2016 02/27/2016  Glucose 65 - 99 mg/dL 158(H) 134(H) 124(H)  BUN 6 - 20 mg/dL 20 16 24   Creatinine 0.44 - 1.00 mg/dL 0.90 0.92 0.88  Sodium 135 - 145 mmol/L 140 139 142  Potassium 3.5 - 5.1 mmol/L 3.7 3.9 4.1  Chloride 101 - 111 mmol/L 103 105 104  CO2 22 - 32 mmol/L 24 27 24   Calcium 8.9 - 10.3 mg/dL 9.7 10.1 9.8  Total  Protein 6.5 - 8.1 g/dL 8.3(H) 7.4 7.6  Total  Bilirubin 0.3 - 1.2 mg/dL 0.7 0.4 0.3  Alkaline Phos 38 - 126 U/L 91 87 78  AST 15 - 41 U/L 37 28 30  ALT 14 - 54 U/L 51 31(H) 38(H)     Chemistry      Component Value Date/Time   NA 140 06/19/2016 1007   K 3.7 06/19/2016 1007   CL 103 06/19/2016 1007   CO2 24 06/19/2016 1007   BUN 20 06/19/2016 1007   CREATININE 0.90 06/19/2016 1007   CREATININE 0.92 06/06/2016 1055      Component Value Date/Time   CALCIUM 9.7 06/19/2016 1007   ALKPHOS 91 06/19/2016 1007   AST 37 06/19/2016 1007   ALT 51 06/19/2016 1007   BILITOT 0.7 06/19/2016 1007     JAK 2 V617F mutation negative CALR mutation negative MPL mutation negative JAK 2 EXON 12 negative        RADIOGRAPHY: I have personally reviewed the radiological images as listed and agreed with the findings in the report.  DG Chest 2 View 04/18/2016 IMPRESSION: No active cardiopulmonary disease. Interval resolution of lingular atelectasis.  ASSESSMENT/PLAN:   Thrombocytosis (HCC)-resolved likely reactive from iron deficiency IDA (iron deficiency anemia) Multiple Sclerosis Iron malabsorption  I reviewed the labs with the patient in detail, she is currently not anemic and thrombocytosis has resolved.   She will return for follow up in 6 months with labs.   ORDERS PLACED FOR THIS ENCOUNTER: Orders Placed This Encounter  Procedures  . CBC with Differential    Standing Status:   Future    Standing Expiration Date:   02/18/2017  . Comprehensive metabolic panel    Standing Status:   Future    Standing Expiration Date:   02/18/2017  . Iron and TIBC    Standing Status:   Future    Standing Expiration Date:   02/18/2017  . Ferritin    Standing Status:   Future    Standing Expiration Date:   02/18/2017    All questions were answered. The patient knows to call the clinic with any problems, questions or concerns. We can certainly see the patient much sooner if necessary.  This document serves as a record of services  personally performed by Twana First, MD. It was created on her behalf by Martinique Casey, a trained medical scribe. The creation of this record is based on the scribe's personal observations and the provider's statements to them. This document has been checked and approved by the attending provider.  I have reviewed the above documentation for accuracy and completeness and I agree with the above.  This note is electronically signed by: Martinique M Casey  06/19/2016 10:43 AM

## 2016-06-19 NOTE — Patient Instructions (Signed)
[place] at [facility] Discharge Instructions  RECOMMENDATIONS MADE BY THE CONSULTANT AND ANY TEST RESULTS WILL BE SENT TO YOUR REFERRING PHYSICIAN.  You were seen today by Dr. Twana First Follow up in 6 months with lab wrk See Amy up front for appointments  Thank you for choosing [facility] at [facility] to provide your oncology and hematology care.  To afford each patient quality time with our provider, please arrive at least 15 minutes before your scheduled appointment time.    If you have a lab appointment with the [facility] please come in thru the  Main Entrance and check in at the main information desk  You need to re-schedule your appointment should you arrive 10 or more minutes late.  We strive to give you quality time with our providers, and arriving late affects you and other patients whose appointments are after yours.  Also, if you no show three or more times for appointments you may be dismissed from the clinic at the providers discretion.     Again, thank you for choosing [facility].  Our hope is that these requests will decrease the amount of time that you wait before being seen by our physicians.       _____________________________________________________________  Should you have questions after your visit to [name], please contact our office at [phone] between the hours of 8:30 a.m. and 4:30 p.m.  Voicemails left after 4:30 p.m. will not be returned until the following business day.  For prescription refill requests, have your pharmacy contact our office.       Resources For Cancer Patients and their Caregivers ? American Cancer Society: Can assist with transportation, wigs, general needs, runs Look Good Feel Better.        804-762-5576 ? Cancer Care: Provides financial assistance, online support groups, medication/co-pay assistance.  1-800-813-HOPE 575-308-2425) ? [place] Assists [place] cancer patients and their families through emotional , educational and financial support.  517-312-9479 ? [place] DSS Where to apply for food stamps, Medicaid and utility assistance. 314-294-7493 ? [facility]: Transportation to medical appointments. (970)016-8139 ? Social Security Administration: May apply for disability if have a Stage IV cancer. 980-745-4145 (916)121-5547 ? [employer], Disability and Transit Services: Assists with nutrition, care and transit needs. [place] Support Programs: @10RELATIVEDAYS @ > Cancer Support Group  2nd Tuesday of the month 1pm-2pm, Journey Room  > Creative Journey  3rd Tuesday of the month 1130am-1pm, Journey Room  > Look Good Feel Better  1st Wednesday of the month 10am-12 noon, Journey Room (Call [place] to register [phone])

## 2016-06-21 DIAGNOSIS — M170 Bilateral primary osteoarthritis of knee: Secondary | ICD-10-CM | POA: Diagnosis not present

## 2016-06-22 ENCOUNTER — Other Ambulatory Visit: Payer: Self-pay

## 2016-06-22 MED ORDER — GLIPIZIDE 10 MG PO TABS: ORAL_TABLET | ORAL | 1 refills | Status: DC

## 2016-06-22 MED ORDER — METFORMIN HCL 1000 MG PO TABS: 1000.0000 mg | ORAL_TABLET | Freq: Two times a day (BID) | ORAL | 1 refills | Status: DC

## 2016-06-22 MED ORDER — TRIAMTERENE-HCTZ 37.5-25 MG PO TABS: 1.0000 | ORAL_TABLET | Freq: Every day | ORAL | 1 refills | Status: DC

## 2016-06-28 ENCOUNTER — Encounter: Payer: Self-pay | Admitting: Cardiology

## 2016-06-28 NOTE — Progress Notes (Signed)
Cardiology Office Note  Date: 06/29/2016   ID: Michaela, Peterson 25-Sep-1955, MRN [id number]  PCP: Tula Nakayama, MD  Consulting Cardiologist: Rozann Lesches, MD   Chief Complaint  Patient presents with  . Leg Swelling    History of Present Illness: Michaela Peterson is a 61 y.o. female referred for cardiology consultation by Dr. Moshe Cipro for cardiac evaluation. She states that she has had a fairly long-standing history of intermittent lower leg and ankle swelling, bilateral. Seems to be worse the more she is up on her feet. She does have arthritis, particularly in her knees and has been undergoing recent injections for pain control. She does not have any history of cardiomyopathy. Additional history includes hypertension and diabetes mellitus.  She states that he has occasional atypical chest pains that are consistent with indigestion. She states that she usually "drinks a Pepsi" and then belches feeling better. She does not have exertional chest pain.  Her current medications. Systolic blood pressures in the 120s today. She is on nifedipine and Maxide. I reviewed her most recent lab work. Renal function is normal. Hemoglobin A1c 7.4.  Past Medical History:  Diagnosis Date  . Adnexal mass    Left; followed by Dr. [name]  . Anemia, iron deficiency   . Arthritis   . Chronic low back pain   . Diabetes mellitus, type 2 (Michaela Peterson)   . Essential hypertension   . Gastroparesis   . GERD (gastroesophageal reflux disease)   . MS (multiple sclerosis) ([place])   . Nonalcoholic fatty liver disease   . Obesity   . Sleep apnea 2009    Past Surgical History:  Procedure Laterality Date  . AGILE CAPSULE N/A 08/11/2014   Procedure: AGILE CAPSULE;  Surgeon: Daneil Dolin, MD;  [facility];  Service: Endoscopy;  Laterality: N/A;  0700  . BLADDER SUSPENSION    . CESAREAN SECTION     x2   . CHOLECYSTECTOMY    . COLONOSCOPY  2010   Dr. [name]: tubular adenoma, few scattered diverticula    . COLONOSCOPY N/A 10/01/2013   Dr. Pixie Casino preparation. Normal rectum. Normal colonic mucosa  . DILATION AND CURETTAGE OF UTERUS     4  . ESOPHAGOGASTRODUODENOSCOPY  2010   Dr. [name]: normal esophagus, small hiatal hernia, questionable pale duodenal mucosa but negative for celiac sprue.   . ESOPHAGOGASTRODUODENOSCOPY N/A 10/01/2013   Dr. Rourk:gastric polyps-status post biopsy. Otherwise, normal EGD. fundic gland polyp, negative H.pylori  . GIVENS CAPSULE STUDY N/A 09/08/2014   Poor prep but overall unremarkable   . KNEE ARTHROSCOPY W/ DEBRIDEMENT    . LAPAROSCOPY ABDOMEN DIAGNOSTIC     lysis of adhesions  . ROTATOR CUFF REPAIR    . TENDON LENGTHENING     Left wrist  . UMBILICAL HERNIA REPAIR      Current Outpatient Prescriptions  Medication Sig Dispense Refill  . allopurinol (ZYLOPRIM) 100 MG tablet Take 1 tablet (100 mg total) by mouth daily. 90 tablet 1  . cetirizine (ZYRTEC) 10 MG tablet Take 1 tablet (10 mg total) by mouth daily. 30 tablet 0  . clobetasol (TEMOVATE) 0.05 % external solution APPLY TO AFFECTED AREA ON SCALP TWICE A DAY (NOT TO FACE, GROIN OR UNDERARMS)  2  . docusate sodium (COLACE) 50 MG capsule Take 50 mg by mouth daily as needed for mild constipation.     Marland Kitchen FARXIGA 10 MG TABS tablet TAKE 1 TABLET BY MOUTH EVERY DAY AT BREAKFAST  4  .  glipiZIDE (GLUCOTROL) 10 MG tablet TAKE 1 TABLET (10 MG TOTAL) BY MOUTH 2 (TWO) TIMES DAILY BEFORE A MEAL. 180 tablet 1  . ibuprofen (ADVIL,MOTRIN) 200 MG tablet Take 200 mg by mouth every 6 (six) hours as needed for moderate pain.     . metFORMIN (GLUCOPHAGE) 1000 MG tablet Take 1 tablet (1,000 mg total) by mouth 2 (two) times daily with a meal. 180 tablet 1  . mupirocin ointment (BACTROBAN) 2 % Apply 1 application topically 2 (two) times daily. 30 g 1  . NIFEdipine (ADALAT CC) 90 MG 24 hr tablet Take 90 mg by mouth daily.    . pantoprazole (PROTONIX) 40 MG tablet TAKE 1 TABLET (40 MG TOTAL) BY MOUTH 2 (TWO) TIMES DAILY BEFORE  A MEAL. 180 tablet 3  . Polysacchar Iron-FA-B12 (FERREX 150 FORTE) 150-1-25 MG-MG-MCG CAPS Take 1 capsule by mouth daily. 90 capsule 1  . Propylene Glycol (SYSTANE BALANCE OP) Apply 1 drop to eye daily. Reported on 07/26/2015    . RELION CONFIRM/MICRO TEST test strip USE STRIPS TO TEST BLOOD SUGAR TWICE DAILY 100 each 0  . triamcinolone (NASACORT AQ) 55 MCG/ACT AERO nasal inhaler Place 2 sprays into the nose daily. 1 Inhaler 0  . triamterene-hydrochlorothiazide (MAXZIDE-25) 37.5-25 MG tablet Take 1 tablet by mouth daily. 90 tablet 1  . Vitamin D, Ergocalciferol, (DRISDOL) 50000 units CAPS capsule TAKE 1 CAPSULE (50,000 UNITS TOTAL) BY MOUTH EVERY 7 (SEVEN) DAYS.  1   No current facility-administered medications for this visit.    Allergies:  Ace inhibitors; Aspirin; Oxycodone-acetaminophen; Penicillins; and Statins   Social History: The patient  reports that she has never smoked. She has never used smokeless tobacco. She reports that she does not drink alcohol or use drugs.   Family History: The patient's family history includes Arthritis in her mother; Diabetes in her sister; Heart disease in her sister; Heart failure in her mother; Hypertension in her mother, sister, and son; Stroke in her father.   ROS:  Please see the history of present illness. Otherwise, complete review of systems is positive for chronic bilateral leg pain, mainly knees.  All other systems are reviewed and negative.   Physical Exam: VS:  BP 128/72   Pulse 89   Ht 5\' 1"  (1.549 m)   Wt 184 lb (83.5 kg)   LMP 08/18/2012   SpO2 97%   BMI 34.77 kg/m , BMI Body mass index is 34.77 kg/m.  Wt Readings from Last 3 Encounters:  06/29/16 184 lb (83.5 kg)  06/19/16 183 lb 3.2 oz (83.1 kg)  06/11/16 185 lb (83.9 kg)    General: Obese woman, appears comfortable at rest. HEENT: Conjunctiva and lids normal, oropharynx clear. Neck: Supple, no elevated JVP or carotid bruits, no thyromegaly. Lungs: Clear to auscultation,  nonlabored breathing at rest. Cardiac: Regular rate and rhythm, no S3, 2/6 systolic murmur, no pericardial rub. Abdomen: Soft, nontender, bowel sounds present, no guarding or rebound. Extremities: Mild  ankle edema, distal pulses 2+. Skin: Warm and dry. Musculoskeletal: No kyphosis. Neuropsychiatric: Alert and oriented x3, affect grossly appropriate.  ECG: I personally reviewed the tracing from 08/12/2013 which showed sinus rhythm with increased voltage and nonspecific T-wave changes.  Recent Labwork: 10/19/2015: TSH 1.65 06/19/2016: ALT 51; AST 37; BUN 20; Creatinine, Ser 0.90; Hemoglobin 12.7; Platelets 386; Potassium 3.7; Sodium 140     Component Value Date/Time   CHOL 170 06/06/2016 1055   TRIG 69 06/06/2016 1055   HDL 67 06/06/2016 1055   CHOLHDL 2.5  06/06/2016 1055   VLDL 14 06/06/2016 1055   LDLCALC 89 06/06/2016 1055    Other Studies Reviewed Today:  Chest x-ray 04/18/2016: FINDINGS: The heart size and mediastinal contours are within normal limits. Both lungs are clear. Previously seen linear opacity within the lingula has resolved, consistent with resolving atelectasis. No evidence of pneumothorax or pleural effusion. The visualized skeletal structures are unremarkable.  IMPRESSION: No active cardiopulmonary disease. Interval resolution of lingular atelectasis.  Assessment and Plan:  1. Long-standing intermittent lower leg edema. She does have arthritis which could be contributing, also likely related to diabetes mellitus. I reviewed her medications. She is on a diuretic in the form of Maxide. Plan to obtain an echocardiogram to assess cardiac structure and function.  2. Atypical chest pain consistent with indigestion, nonexertional. She is on Protonix.  3. Type 2 diabetes mellitus followed by Dr. Moshe Cipro, most recent hemoglobin A1c 7.4.  4. Essential hypertension, systolic blood pressure in the 120s today on verapamil and Maxide.  Current medicines were reviewed  with the patient today.   Orders Placed This Encounter  Procedures  . EKG 12-Lead  . ECHOCARDIOGRAM COMPLETE    Disposition: Call with test results.  Signed, Satira Sark, MD, [facility] 06/29/2016 10:31 AM    [facility] at [facility]. [address], [place], [place] [address] Phone: [phone]; Fax: [phone]

## 2016-06-29 ENCOUNTER — Ambulatory Visit (INDEPENDENT_AMBULATORY_CARE_PROVIDER_SITE_OTHER): Payer: Medicare Other | Admitting: Cardiology

## 2016-06-29 ENCOUNTER — Encounter: Payer: Self-pay | Admitting: Cardiology

## 2016-06-29 VITALS — BP 128/72 | HR 89 | Ht 61.0 in | Wt 184.0 lb

## 2016-06-29 DIAGNOSIS — R0602 Shortness of breath: Secondary | ICD-10-CM | POA: Diagnosis not present

## 2016-06-29 DIAGNOSIS — I10 Essential (primary) hypertension: Secondary | ICD-10-CM

## 2016-06-29 DIAGNOSIS — R0789 Other chest pain: Secondary | ICD-10-CM | POA: Diagnosis not present

## 2016-06-29 DIAGNOSIS — R600 Localized edema: Secondary | ICD-10-CM

## 2016-06-29 DIAGNOSIS — M170 Bilateral primary osteoarthritis of knee: Secondary | ICD-10-CM | POA: Diagnosis not present

## 2016-06-29 DIAGNOSIS — E119 Type 2 diabetes mellitus without complications: Secondary | ICD-10-CM

## 2016-06-29 NOTE — Patient Instructions (Signed)
Your physician recommends that you schedule a follow-up appointment in: we will call you with results     Your physician has requested that you have an echocardiogram. Echocardiography is a painless test that uses sound waves to create images of your heart. It provides your doctor with information about the size and shape of your heart and how well your heart's chambers and valves are working. This procedure takes approximately one hour. There are no restrictions for this procedure.       Your physician recommends that you continue on your current medications as directed. Please refer to the Current Medication list given to you today.     Thank you for choosing [facility] !

## 2016-07-05 DIAGNOSIS — M170 Bilateral primary osteoarthritis of knee: Secondary | ICD-10-CM | POA: Diagnosis not present

## 2016-07-08 ENCOUNTER — Other Ambulatory Visit: Payer: Self-pay | Admitting: Family Medicine

## 2016-07-11 ENCOUNTER — Ambulatory Visit (HOSPITAL_COMMUNITY)
Admission: RE | Admit: 2016-07-11 | Discharge: 2016-07-11 | Disposition: A | Discharge status: Home / Self Care | Payer: Medicare Other | Source: Ambulatory Visit | Attending: Cardiology | Admitting: Cardiology

## 2016-07-11 DIAGNOSIS — R0602 Shortness of breath: Secondary | ICD-10-CM | POA: Insufficient documentation

## 2016-07-11 DIAGNOSIS — R600 Localized edema: Secondary | ICD-10-CM

## 2016-07-11 NOTE — Progress Notes (Signed)
*  PRELIMINARY RESULTS* Echocardiogram 2D Echocardiogram has been performed.  Michaela Peterson 07/11/2016, 11:45 AM

## 2016-07-25 ENCOUNTER — Other Ambulatory Visit: Payer: Self-pay | Admitting: Family Medicine

## 2016-07-30 ENCOUNTER — Other Ambulatory Visit: Payer: Self-pay

## 2016-07-30 MED ORDER — NIFEDIPINE ER 90 MG PO TB24: 90.0000 mg | ORAL_TABLET | Freq: Every day | ORAL | 1 refills | Status: DC

## 2016-07-31 ENCOUNTER — Other Ambulatory Visit: Payer: Self-pay | Admitting: Family Medicine

## 2016-07-31 DIAGNOSIS — Z1231 Encounter for screening mammogram for malignant neoplasm of breast: Secondary | ICD-10-CM

## 2016-08-01 ENCOUNTER — Encounter: Payer: Self-pay | Admitting: Pulmonary Disease

## 2016-08-01 ENCOUNTER — Ambulatory Visit (INDEPENDENT_AMBULATORY_CARE_PROVIDER_SITE_OTHER): Payer: Medicare Other | Admitting: Pulmonary Disease

## 2016-08-01 VITALS — BP 130/60 | HR 68 | Ht 61.0 in | Wt 184.4 lb

## 2016-08-01 DIAGNOSIS — G4733 Obstructive sleep apnea (adult) (pediatric): Secondary | ICD-10-CM

## 2016-08-01 DIAGNOSIS — G4710 Hypersomnia, unspecified: Secondary | ICD-10-CM | POA: Diagnosis not present

## 2016-08-01 NOTE — Assessment & Plan Note (Signed)
Pt was diagnosed with OSA in 1990s. She was symptomatic with snoring, gasping, witnessed apneas, unrefreshed sleep. She had a lab study which showed mild OSA and was started on cpap. She could not tolerate the CPAP machine secondary to pressure. She would end up removing her mask in the middle of the night. She stopped using her CPAP therapy after several months.   She continues to be symptomatic. Has snoring, unrefreshed sleep. Occasional witnessed apneas, frequent awakenings.  She naps in pm.  No abnormal behavior and sleep.  ESS 15.   Plan :   We discussed about the diagnosis of Obstructive Sleep Apnea (OSA) and implications of untreated OSA. We discussed about CPAP and BiPaP as possible treatment options.    We will schedule the patient for a sleep study.  I wanted to get a lab sleep study but she preferred to have a home sleep study. Most likely, she will be moderate sleep apnea given her symptoms. We can probably try her an auto CPAP 5-15 centimeters water unless she ends up having more severe sleep apnea. If she ends up with severe OSA, plan for a cpap titration study.    Patient was instructed to call the office if he/she has not heard back from the office 1-2 weeks after the sleep study.   Patient was instructed to call the office if he/she is having issues with the PAP device.   We discussed good sleep hygiene.   Patient was advised not to engage in activities requiring concentration and/or vigilance if he/she is sleepy.  Patient was advised not to drive if he/she is sleepy.

## 2016-08-01 NOTE — Progress Notes (Signed)
Subjective:    Patient ID: Michaela Peterson, female    DOB: 1955/07/31, 61 y.o.   MRN: [id number]  HPI   This is the case of Michaela Peterson, 61 y.o. Female, who was referred by Dr. Tula Nakayama in consultation regarding OSA.   As you very well know, patient is a non  Smoker, not known to have asthma or copd, was diagnosed with OSA in 1990s. She was symptomatic with snoring, gasping, witnessed apneas, unrefreshed sleep. She had a lab study which showed mild OSA and was started on cpap. She could not tolerate the CPAP machine secondary to pressure. She would end up removing her mask in the middle of the night. She stopped using her CPAP therapy.  She continues to be symptomatic. Has snoring, unrefreshed sleep. Occasional witnessed apneas, frequent awakenings.  She naps in pm.  No abnormal behavior and sleep.  ESS 15.    Review of Systems  Constitutional: Negative for fever and unexpected weight change.  HENT: Negative for congestion, dental problem, ear pain, nosebleeds, postnasal drip, rhinorrhea, sinus pressure, sneezing, sore throat and trouble swallowing.   Eyes: Negative for redness and itching.  Respiratory: Positive for shortness of breath. Negative for cough, chest tightness and wheezing.   Cardiovascular: Positive for leg swelling. Negative for palpitations.  Gastrointestinal: Negative for nausea and vomiting.  Genitourinary: Negative for dysuria.  Musculoskeletal: Negative for joint swelling.  Skin: Negative for rash.  Neurological: Negative for headaches.  Hematological: Does not bruise/bleed easily.  Psychiatric/Behavioral: Negative for dysphoric mood. The patient is not nervous/anxious.    Past Medical History:  Diagnosis Date  . Adnexal mass    Left; followed by Dr. [name]  . Anemia, iron deficiency   . Arthritis   . Chronic low back pain   . Diabetes mellitus, type 2 ([place])   . Essential hypertension   . Gastroparesis   . GERD (gastroesophageal reflux disease)    . MS (multiple sclerosis) ([place])   . Nonalcoholic fatty liver disease   . Obesity   . Sleep apnea 2009   (-) CA, DVT  Family History  Problem Relation Age of Onset  . Hypertension Sister   . Diabetes Sister   . Heart disease Sister   . Heart failure Mother   . Hypertension Mother   . Arthritis Mother   . Stroke Father   . Hypertension Son   . Colon cancer Neg Hx      Past Surgical History:  Procedure Laterality Date  . AGILE CAPSULE N/A 08/11/2014   Procedure: AGILE CAPSULE;  Surgeon: Daneil Dolin, MD;  [facility];  Service: Endoscopy;  Laterality: N/A;  0700  . BLADDER SUSPENSION    . CESAREAN SECTION     x2   . CHOLECYSTECTOMY    . COLONOSCOPY  2010   Dr. [name]: tubular adenoma, few scattered diverticula  . COLONOSCOPY N/A 10/01/2013   Dr. Pixie Casino preparation. Normal rectum. Normal colonic mucosa  . DILATION AND CURETTAGE OF UTERUS     4  . ESOPHAGOGASTRODUODENOSCOPY  2010   Dr. [name]: normal esophagus, small hiatal hernia, questionable pale duodenal mucosa but negative for celiac sprue.   . ESOPHAGOGASTRODUODENOSCOPY N/A 10/01/2013   Dr. Rourk:gastric polyps-status post biopsy. Otherwise, normal EGD. fundic gland polyp, negative H.pylori  . GIVENS CAPSULE STUDY N/A 09/08/2014   Poor prep but overall unremarkable   . KNEE ARTHROSCOPY W/ DEBRIDEMENT    . LAPAROSCOPY ABDOMEN DIAGNOSTIC     lysis of adhesions  .  ROTATOR CUFF REPAIR    . TENDON LENGTHENING     Left wrist  . UMBILICAL HERNIA REPAIR      Social History   Social History  . Marital status: Married    Spouse name: [name]   . Number of children: 4  . Years of education: 107   Occupational History  . unemployed  Unemployed   Social History Main Topics  . Smoking status: Never Smoker  . Smokeless tobacco: Never Used     Comment: patient lives with a smoker   . Alcohol use No  . Drug use: No  . Sexual activity: Yes   Other Topics Concern  . Not on file   Social History  Narrative   Patient lives at home with husband [name].    Patient has 4 children.    Patient has a some college.    Patient is right handed.    Patient is currently not working.      Allergies  Allergen Reactions  . Ace Inhibitors Cough  . Aspirin     REACTION: unknown reaction  . Oxycodone-Acetaminophen Hives  . Penicillins Hives  . Statins     NAFLD     Outpatient Medications Prior to Visit  Medication Sig Dispense Refill  . allopurinol (ZYLOPRIM) 100 MG tablet Take 1 tablet (100 mg total) by mouth daily. 90 tablet 1  . cetirizine (ZYRTEC) 10 MG tablet Take 1 tablet (10 mg total) by mouth daily. 30 tablet 0  . clobetasol (TEMOVATE) 0.05 % external solution APPLY TO AFFECTED AREA ON SCALP TWICE A DAY (NOT TO FACE, GROIN OR UNDERARMS)  2  . docusate sodium (COLACE) 50 MG capsule Take 50 mg by mouth daily as needed for mild constipation.     Marland Kitchen FARXIGA 10 MG TABS tablet TAKE 1 TABLET BY MOUTH EVERY DAY AT BREAKFAST  4  . glipiZIDE (GLUCOTROL) 10 MG tablet TAKE 1 TABLET (10 MG TOTAL) BY MOUTH 2 (TWO) TIMES DAILY BEFORE A MEAL. 180 tablet 1  . ibuprofen (ADVIL,MOTRIN) 200 MG tablet Take 200 mg by mouth every 6 (six) hours as needed for moderate pain.     . metFORMIN (GLUCOPHAGE) 1000 MG tablet Take 1 tablet (1,000 mg total) by mouth 2 (two) times daily with a meal. 180 tablet 1  . metFORMIN (GLUCOPHAGE) 1000 MG tablet TAKE 1 TABLET (1,000 MG TOTAL) BY MOUTH 2 (TWO) TIMES DAILY WITH A MEAL. 180 tablet 1  . mupirocin ointment (BACTROBAN) 2 % Apply 1 application topically 2 (two) times daily. 30 g 1  . NIFEdipine (ADALAT CC) 90 MG 24 hr tablet Take 90 mg by mouth daily.    Marland Kitchen NIFEdipine (ADALAT CC) 90 MG 24 hr tablet Take 1 tablet (90 mg total) by mouth daily. 90 tablet 1  . pantoprazole (PROTONIX) 40 MG tablet TAKE 1 TABLET (40 MG TOTAL) BY MOUTH 2 (TWO) TIMES DAILY BEFORE A MEAL. 180 tablet 3  . Polysacchar Iron-FA-B12 (FERREX 150 FORTE) 150-1-25 MG-MG-MCG CAPS Take 1 capsule by mouth  daily. 90 capsule 1  . Propylene Glycol (SYSTANE BALANCE OP) Apply 1 drop to eye daily. Reported on 07/26/2015    . RELION CONFIRM/MICRO TEST test strip USE STRIPS TO TEST BLOOD SUGAR TWICE DAILY 100 each 0  . triamcinolone (NASACORT AQ) 55 MCG/ACT AERO nasal inhaler Place 2 sprays into the nose daily. 1 Inhaler 0  . triamterene-hydrochlorothiazide (MAXZIDE-25) 37.5-25 MG tablet Take 1 tablet by mouth daily. 90 tablet 1  . Vitamin D, Ergocalciferol, (DRISDOL)  50000 units CAPS capsule TAKE 1 CAPSULE (50,000 UNITS TOTAL) BY MOUTH EVERY 7 (SEVEN) DAYS.  1   No facility-administered medications prior to visit.    No orders of the defined types were placed in this encounter.       Objective:   Physical Exam  Vitals:  Vitals:   08/01/16 1143  BP: 130/60  Pulse: 68  SpO2: 98%  Weight: 184 lb 6.4 oz (83.6 kg)  Height: 5\' 1"  (1.549 m)    Constitutional/General:  Pleasant, well-nourished, well-developed, not in any distress,  Comfortably seating.  Well kempt  Body mass index is 34.84 kg/m. Wt Readings from Last 3 Encounters:  08/01/16 184 lb 6.4 oz (83.6 kg)  06/29/16 184 lb (83.5 kg)  06/19/16 183 lb 3.2 oz (83.1 kg)     HEENT: Pupils equal and reactive to light and accommodation. Anicteric sclerae. Normal nasal mucosa.   No oral  lesions,  mouth clear,  oropharynx clear, no postnasal drip. (-) Oral thrush. No dental caries.  Airway - Mallampati class III  Neck: No masses. Midline trachea. No JVD, (-) LAD. (-) bruits appreciated.  Respiratory/Chest: Grossly normal chest. (-) deformity. (-) Accessory muscle use.  Symmetric expansion. (-) Tenderness on palpation.  Resonant on percussion.  Diminished BS on both lower lung zones. (-) wheezing, crackles, rhonchi (-) egophony  Cardiovascular: Regular rate and  rhythm, heart sounds normal, no murmur or gallops, no peripheral edema  Gastrointestinal:  Normal bowel sounds. Soft, non-tender. No hepatosplenomegaly.  (-) masses.    Musculoskeletal:  Normal muscle tone. Normal gait.   Extremities: Grossly normal. (-) clubbing, cyanosis.  (-) edema  Skin: (-) rash,lesions seen.   Neurological/Psychiatric : alert, oriented to time, place, person. Normal mood and affect          Assessment & Plan:  OSA (obstructive sleep apnea) Pt was diagnosed with OSA in 1990s. She was symptomatic with snoring, gasping, witnessed apneas, unrefreshed sleep. She had a lab study which showed mild OSA and was started on cpap. She could not tolerate the CPAP machine secondary to pressure. She would end up removing her mask in the middle of the night. She stopped using her CPAP therapy after several months.   She continues to be symptomatic. Has snoring, unrefreshed sleep. Occasional witnessed apneas, frequent awakenings.  She naps in pm.  No abnormal behavior and sleep.  ESS 15.   Plan :   We discussed about the diagnosis of Obstructive Sleep Apnea (OSA) and implications of untreated OSA. We discussed about CPAP and BiPaP as possible treatment options.    We will schedule the patient for a sleep study.  I wanted to get a lab sleep study but she preferred to have a home sleep study. Most likely, she will be moderate sleep apnea given her symptoms. We can probably try her an auto CPAP 5-15 centimeters water unless she ends up having more severe sleep apnea. If she ends up with severe OSA, plan for a cpap titration study.    Patient was instructed to call the office if he/she has not heard back from the office 1-2 weeks after the sleep study.   Patient was instructed to call the office if he/she is having issues with the PAP device.   We discussed good sleep hygiene.   Patient was advised not to engage in activities requiring concentration and/or vigilance if he/she is sleepy.  Patient was advised not to drive if he/she is sleepy.       Thank  you very much for letting me participate in this patient's care. Please do not  hesitate to give me a call if you have any questions or concerns regarding the treatment plan.   Patient will follow up with [place] in 8-10 weeks.    Monica Becton, MD 08/01/2016   1:17 PM Pulmonary and [place] Pager: [phone] Office: [phone], Fax: [phone]

## 2016-08-01 NOTE — Patient Instructions (Signed)
It was a pleasure taking care of you today!  We will schedule you to have a sleep study to determine if you have sleep apnea.   We will get a home sleep test.  You will be instructed to come back to the office to get an apparatus to sleep with overnight.  Once we have the apparatus, it will usually take [place] 1-2 weeks to read the study and get back at you with results of the test.  Please give [place] a call in 2 weeks after your study if you do not hear back from [place].    If the sleep study is positive, we will order you a CPAP  machine.  Please call the office if you do NOT receive your machine in the next 1-2 weeks.   Please make sure you use your CPAP device everytime you sleep.  We will monitor the usage of your machine per your insurance requirement.  Your insurance company may take the machine from you if you are not using it regularly.   Please clean the mask, tubings, filter, water reservoir with soapy water every week.  Please use distilled water for the water reservoir.   Please call the office or your machine provider (DME company) if you are having issues with the device.   Return to clinic in 8-10 weeks with NP

## 2016-08-13 ENCOUNTER — Ambulatory Visit (INDEPENDENT_AMBULATORY_CARE_PROVIDER_SITE_OTHER): Payer: Medicare Other | Admitting: Nurse Practitioner

## 2016-08-13 ENCOUNTER — Encounter: Payer: Self-pay | Admitting: Nurse Practitioner

## 2016-08-13 VITALS — BP 142/73 | HR 70 | Ht 61.0 in | Wt 186.4 lb

## 2016-08-13 DIAGNOSIS — G35 Multiple sclerosis: Secondary | ICD-10-CM | POA: Diagnosis not present

## 2016-08-13 NOTE — Patient Instructions (Signed)
MS symptoms are stable Continue regular exercise for overall health and well-being along with weight loss Given information on a cooling vest that can be ordered on line [phone] polar products She will continue to follow up yearly and when necessary

## 2016-08-13 NOTE — Progress Notes (Signed)
[facility]  PATIENT: Michaela Peterson DOB: 1955/04/08   REASON FOR VISIT: Follow-up for multiple sclerosis  HISTORY FROM: Patient    HISTORY OF PRESENT ILLNESS:Michaela Peterson, 61 year old female returns for yearly followup.  She has a history of multiple sclerosis without significant clinical progression. Last MRI of the brain in 2011 did not show progression from previous, she is not interested in resuming any MS treatment even after discussing the importance of maintaining her current functional status.  She was diagnosed in 2004 on the basis of demyelinating plaques in the cervical spine and typical CSF findings. She was initially treated with Rebif but discontinued the drug due to her injection site reactions in April of 2005. She has not been on disease modifying treatment since that time, she reports some numbness and tingling in the feet which she feels has been stable for many years. She has bilateral knee pain and recently had injections. She has been told she needs total knee replacements. She is also a diabetic which is not in good control with most recent hemoglobin A1c over 7 .She also has hypertension. She denies any double vision, dysarthria, dysphagia, bowel or bladder difficulties. She has not had any falls. She denies any balance issues. She is not walking for exercise. She has an intolerance to heat. She returns for reevaluation    REVIEW OF SYSTEMS: Full 14 system review of systems performed and notable only for those listed, all others are neg:  Constitutional: neg  Cardiovascular: neg Ear/Nose/Throat: neg  Skin: neg Eyes: neg Respiratory: neg Gastroitestinal: neg  Hematology/Lymphatic: neg  Endocrine: Heat intolerance Musculoskeletal: Joint pain , bilateral knees Allergy/Immunology: neg Neurological: neg Psychiatric: neg Sleep : neg   ALLERGIES: Allergies  Allergen Reactions  . Ace Inhibitors Cough  . Aspirin     REACTION: unknown reaction    . Oxycodone-Acetaminophen Hives  . Penicillins Hives  . Statins     NAFLD    HOME MEDICATIONS: Outpatient Medications Prior to Visit  Medication Sig Dispense Refill  . allopurinol (ZYLOPRIM) 100 MG tablet Take 1 tablet (100 mg total) by mouth daily. 90 tablet 1  . cetirizine (ZYRTEC) 10 MG tablet Take 1 tablet (10 mg total) by mouth daily. 30 tablet 0  . clobetasol (TEMOVATE) 0.05 % external solution APPLY TO AFFECTED AREA ON SCALP TWICE A DAY (NOT TO FACE, GROIN OR UNDERARMS)  2  . docusate sodium (COLACE) 50 MG capsule Take 50 mg by mouth daily as needed for mild constipation.     Marland Kitchen FARXIGA 10 MG TABS tablet TAKE 1 TABLET BY MOUTH EVERY DAY AT BREAKFAST  4  . glipiZIDE (GLUCOTROL) 10 MG tablet TAKE 1 TABLET (10 MG TOTAL) BY MOUTH 2 (TWO) TIMES DAILY BEFORE A MEAL. 180 tablet 1  . ibuprofen (ADVIL,MOTRIN) 200 MG tablet Take 200 mg by mouth every 6 (six) hours as needed for moderate pain.     . metFORMIN (GLUCOPHAGE) 1000 MG tablet Take 1 tablet (1,000 mg total) by mouth 2 (two) times daily with a meal. 180 tablet 1  . metFORMIN (GLUCOPHAGE) 1000 MG tablet TAKE 1 TABLET (1,000 MG TOTAL) BY MOUTH 2 (TWO) TIMES DAILY WITH A MEAL. 180 tablet 1  . mupirocin ointment (BACTROBAN) 2 % Apply 1 application topically 2 (two) times daily. 30 g 1  . NIFEdipine (ADALAT CC) 90 MG 24 hr tablet Take 1 tablet (90 mg total) by mouth daily. 90 tablet 1  . pantoprazole (PROTONIX) 40 MG tablet TAKE 1 TABLET (40  MG TOTAL) BY MOUTH 2 (TWO) TIMES DAILY BEFORE A MEAL. 180 tablet 3  . Polysacchar Iron-FA-B12 (FERREX 150 FORTE) 150-1-25 MG-MG-MCG CAPS Take 1 capsule by mouth daily. 90 capsule 1  . Propylene Glycol (SYSTANE BALANCE OP) Apply 1 drop to eye daily. Reported on 07/26/2015    . RELION CONFIRM/MICRO TEST test strip USE STRIPS TO TEST BLOOD SUGAR TWICE DAILY 100 each 0  . triamcinolone (NASACORT AQ) 55 MCG/ACT AERO nasal inhaler Place 2 sprays into the nose daily. 1 Inhaler 0  .  triamterene-hydrochlorothiazide (MAXZIDE-25) 37.5-25 MG tablet Take 1 tablet by mouth daily. 90 tablet 1  . Vitamin D, Ergocalciferol, (DRISDOL) 50000 units CAPS capsule TAKE 1 CAPSULE (50,000 UNITS TOTAL) BY MOUTH EVERY 7 (SEVEN) DAYS.  1  . NIFEdipine (ADALAT CC) 90 MG 24 hr tablet Take 90 mg by mouth daily.     No facility-administered medications prior to visit.     PAST MEDICAL HISTORY: Past Medical History:  Diagnosis Date  . Adnexal mass    Left; followed by Dr. [name]  . Anemia, iron deficiency   . Arthritis   . Chronic low back pain   . Diabetes mellitus, type 2 ([place])   . Essential hypertension   . Gastroparesis   . GERD (gastroesophageal reflux disease)   . MS (multiple sclerosis) ([place])   . Nonalcoholic fatty liver disease   . Obesity   . Sleep apnea 2009    PAST SURGICAL HISTORY: Past Surgical History:  Procedure Laterality Date  . AGILE CAPSULE N/A 08/11/2014   Procedure: AGILE CAPSULE;  Surgeon: Daneil Dolin, MD;  [facility];  Service: Endoscopy;  Laterality: N/A;  0700  . BLADDER SUSPENSION    . CESAREAN SECTION     x2   . CHOLECYSTECTOMY    . COLONOSCOPY  2010   Dr. [name]: tubular adenoma, few scattered diverticula  . COLONOSCOPY N/A 10/01/2013   Dr. Pixie Casino preparation. Normal rectum. Normal colonic mucosa  . DILATION AND CURETTAGE OF UTERUS     4  . ESOPHAGOGASTRODUODENOSCOPY  2010   Dr. [name]: normal esophagus, small hiatal hernia, questionable pale duodenal mucosa but negative for celiac sprue.   . ESOPHAGOGASTRODUODENOSCOPY N/A 10/01/2013   Dr. Rourk:gastric polyps-status post biopsy. Otherwise, normal EGD. fundic gland polyp, negative H.pylori  . GIVENS CAPSULE STUDY N/A 09/08/2014   Poor prep but overall unremarkable   . KNEE ARTHROSCOPY W/ DEBRIDEMENT    . LAPAROSCOPY ABDOMEN DIAGNOSTIC     lysis of adhesions  . ROTATOR CUFF REPAIR    . TENDON LENGTHENING     Left wrist  . UMBILICAL HERNIA REPAIR      FAMILY  HISTORY: Family History  Problem Relation Age of Onset  . Hypertension Sister   . Diabetes Sister   . Heart disease Sister   . Heart failure Mother   . Hypertension Mother   . Arthritis Mother   . Stroke Father   . Hypertension Son   . Colon cancer Neg Hx     SOCIAL HISTORY: Social History   Social History  . Marital status: Married    Spouse name: [name]   . Number of children: 4  . Years of education: 19   Occupational History  . unemployed  Unemployed   Social History Main Topics  . Smoking status: Never Smoker  . Smokeless tobacco: Never Used     Comment: patient lives with a smoker   . Alcohol use No  . Drug use: No  .  Sexual activity: Yes   Other Topics Concern  . Not on file   Social History Narrative   Patient lives at home with husband [name].    Patient has 4 children.    Patient has a some college.    Patient is right handed.    Patient is currently not working.      PHYSICAL EXAM  Vitals:   08/13/16 1039  BP: (!) 142/73  Pulse: 70  Weight: 186 lb 6.4 oz (84.6 kg)  Height: 5\' 1"  (1.549 m)   Body mass index is 35.22 kg/m. Generalized: Well developed, obese female in no acute distress  Head: normocephalic and atraumatic,. Oropharynx benign  Neck: Supple, no carotid bruits  Musculoskeletal: No deformity   Neurological examination   Mentation: Alert oriented to time, place, history taking. Follows all commands speech and language fluent  Cranial nerve II-XII: Visual acuity 20/30  bilaterally. Fundoscopic exam reveals sharp disc margins.Pupils were equal round reactive to light extraocular movements were full, visual field were full on confrontational test. Facial sensation and strength were normal. hearing was intact to finger rubbing bilaterally. Uvula tongue midline. head turning and shoulder shrug were normal and symmetric.Tongue protrusion into cheek strength was normal. Motor: normal bulk and tone, full strength in the BUE, BLE, fine  finger movements normal, no pronator drift. No focal weakness Sensory: normal and symmetric to light touch, pinprick, and vibration in the upper and lower extremities Coordination: finger-nose-finger, heel-to-shin bilaterally, no dysmetria Reflexes: Symmetric upper and lower, Achilles absent, plantar responses were flexor bilaterally. Gait and Station: Rising up from seated position without assistance, normal stance, moderate stride, good arm swing, smooth turning, able to perform tiptoe, and heel walking without difficulty. Tandem gait is steady. Romberg negative. No assistive device   DIAGNOSTIC DATA (LABS, IMAGING, TESTING) - I reviewed patient records, labs, notes, testing and imaging myself where available.  Lab Results  Component Value Date   WBC 10.6 (H) 06/19/2016   HGB 12.7 06/19/2016   HCT 37.7 06/19/2016   MCV 85.3 06/19/2016   PLT 386 06/19/2016      Component Value Date/Time   NA 140 06/19/2016 1007   K 3.7 06/19/2016 1007   CL 103 06/19/2016 1007   CO2 24 06/19/2016 1007   GLUCOSE 158 (H) 06/19/2016 1007   BUN 20 06/19/2016 1007   CREATININE 0.90 06/19/2016 1007   CREATININE 0.92 06/06/2016 1055   CALCIUM 9.7 06/19/2016 1007   PROT 8.3 (H) 06/19/2016 1007   ALBUMIN 4.4 06/19/2016 1007   AST 37 06/19/2016 1007   ALT 51 06/19/2016 1007   ALKPHOS 91 06/19/2016 1007   BILITOT 0.7 06/19/2016 1007   GFRNONAA >60 06/19/2016 1007   GFRNONAA 68 06/06/2016 1055   GFRAA >60 06/19/2016 1007   GFRAA 78 06/06/2016 1055   Lab Results  Component Value Date   CHOL 170 06/06/2016   HDL 67 06/06/2016   LDLCALC 89 06/06/2016   TRIG 69 06/06/2016   CHOLHDL 2.5 06/06/2016   Lab Results  Component Value Date   HGBA1C 7.4 (H) 06/06/2016      ASSESSMENT AND PLAN 61 y.o. year old female has a past medical history of Diabetes mellitus, type 2; Sleep apnea (2009); MS (multiple sclerosis); diagnosed in 2004 and has been off of disease modifying therapy since April 2005 due  to injection site problems. Most recent MRI of the brain in 2011 did not show any progression of the disease since 2007.  PLAN: Once again discussed disease modifying  agents , oral preparations that are available now for multiple sclerosis the patient is not interested in any treatment for her multiple sclerosis. She is not interested in repeat MRI of the brain  MS symptoms are stable Continue regular exercise for overall health and well-being along with weight loss Given information on a cooling vest that can be ordered on line [phone] polar products She will continue to follow up yearly and when necessary, patient does not want to be dismissed from the practice Dennie Bible, [facility], [facility], [facility] [address] [place], [place] [address] [phone]

## 2016-08-13 NOTE — Progress Notes (Signed)
I reviewed note and agree with plan.   Penni Bombard, MD 07/16/9435, 0:05 PM Certified in Neurology, Neurophysiology and Neuroimaging  [facility] [address] [place], [place] [address] [phone]

## 2016-08-27 DIAGNOSIS — G4733 Obstructive sleep apnea (adult) (pediatric): Secondary | ICD-10-CM | POA: Diagnosis not present

## 2016-08-29 ENCOUNTER — Telehealth: Payer: Self-pay | Admitting: Pulmonary Disease

## 2016-08-29 DIAGNOSIS — G4733 Obstructive sleep apnea (adult) (pediatric): Secondary | ICD-10-CM | POA: Diagnosis not present

## 2016-08-29 NOTE — Telephone Encounter (Signed)
HST 08/27/16 >> AHI 25.4, SaO2 low 80%.   Will have my nurse inform pt that sleep study shows moderate sleep apnea.  If she is agreeable, then please arrange for in lab titration study.

## 2016-08-30 ENCOUNTER — Other Ambulatory Visit: Payer: Self-pay | Admitting: *Deleted

## 2016-08-30 DIAGNOSIS — G4710 Hypersomnia, unspecified: Secondary | ICD-10-CM

## 2016-09-04 ENCOUNTER — Telehealth: Payer: Self-pay | Admitting: Nurse Practitioner

## 2016-09-04 ENCOUNTER — Other Ambulatory Visit: Payer: Self-pay

## 2016-09-04 ENCOUNTER — Encounter: Payer: Self-pay | Admitting: *Deleted

## 2016-09-04 ENCOUNTER — Telehealth: Payer: Self-pay | Admitting: Acute Care

## 2016-09-04 DIAGNOSIS — G4733 Obstructive sleep apnea (adult) (pediatric): Secondary | ICD-10-CM

## 2016-09-04 NOTE — Telephone Encounter (Signed)
Advised pt of results. Pt understood and agrees to a titration study.

## 2016-09-04 NOTE — Telephone Encounter (Signed)
Spoke with pt, who states she is scheduled for titration on 09/26/16 and OV with SG on 09/27/16. Pt is wanting to know if she should reschedule her apt with SG until after her sleep study. Pt last seen Dr. Larkin Ina on 08/01/16 and was instructed to f/u with NP in 8-10wk to f/u on OSA and hypersomnia.  SG please advise. Thanks.

## 2016-09-04 NOTE — Telephone Encounter (Signed)
Patient calling to get a letter stating she has MS in order to get a cooling vest. Please mail to the patient.

## 2016-09-04 NOTE — Telephone Encounter (Signed)
Order placed for CPAP titration.

## 2016-09-05 NOTE — Telephone Encounter (Signed)
Letter mailed to pt.  

## 2016-09-06 ENCOUNTER — Telehealth: Payer: Self-pay | Admitting: Family Medicine

## 2016-09-06 ENCOUNTER — Other Ambulatory Visit: Payer: Self-pay

## 2016-09-06 MED ORDER — MONTELUKAST SODIUM 10 MG PO TABS: ORAL_TABLET | ORAL | 1 refills | Status: DC

## 2016-09-06 NOTE — Telephone Encounter (Signed)
SG please advise, thanks

## 2016-09-06 NOTE — Telephone Encounter (Signed)
Patient requesting Rx Singulair 90 day supply   Please call in to [facility]

## 2016-09-06 NOTE — Telephone Encounter (Signed)
Done

## 2016-09-07 ENCOUNTER — Ambulatory Visit (HOSPITAL_COMMUNITY)
Admission: RE | Admit: 2016-09-07 | Discharge: 2016-09-07 | Disposition: A | Discharge status: Home / Self Care | Payer: Medicare Other | Source: Ambulatory Visit | Attending: Family Medicine | Admitting: Family Medicine

## 2016-09-07 DIAGNOSIS — Z1231 Encounter for screening mammogram for malignant neoplasm of breast: Secondary | ICD-10-CM | POA: Diagnosis not present

## 2016-09-08 ENCOUNTER — Other Ambulatory Visit: Payer: Self-pay | Admitting: Family Medicine

## 2016-09-10 NOTE — Telephone Encounter (Signed)
Spoke with pt. Advised her that there would be no point to her appointment with SG. Her sleep study results will not be back in time for this appointment. Per pt's request, this appointment has been canceled. Nothing further was needed.

## 2016-09-26 ENCOUNTER — Ambulatory Visit: Payer: Medicare Other | Attending: Pulmonary Disease | Admitting: Pulmonary Disease

## 2016-09-26 DIAGNOSIS — G4761 Periodic limb movement disorder: Secondary | ICD-10-CM | POA: Diagnosis not present

## 2016-09-26 DIAGNOSIS — G4733 Obstructive sleep apnea (adult) (pediatric): Secondary | ICD-10-CM | POA: Insufficient documentation

## 2016-09-27 ENCOUNTER — Ambulatory Visit: Payer: Medicare Other | Admitting: Acute Care

## 2016-10-08 ENCOUNTER — Telehealth: Payer: Self-pay | Admitting: Pulmonary Disease

## 2016-10-08 DIAGNOSIS — G4733 Obstructive sleep apnea (adult) (pediatric): Secondary | ICD-10-CM

## 2016-10-08 DIAGNOSIS — G4761 Periodic limb movement disorder: Secondary | ICD-10-CM | POA: Insufficient documentation

## 2016-10-08 NOTE — Telephone Encounter (Signed)
CPAP titration 09/26/16 >> CPAP 8 cm H2O   Please arrange for pt to be set up with CPAP 8 cm H2O with heated humidity and mask of choice.  She needs ROV in 2 months with me or NP.

## 2016-10-08 NOTE — Procedures (Signed)
   Patient Name: [name], Michaela Peterson Date: 09/26/2016 Gender: Female D.O.B: Jun 28, 1955 Age (years): 51 Referring Provider: Chesley Mires MD, ABSM Height (inches): 61 Interpreting Physician: Chesley Mires MD, ABSM Weight (lbs): 184 RPSGT: Peak, Robert BMI: 35 MRN: [id number] Neck Size: 16.00  CLINICAL INFORMATION The patient is referred for a CPAP titration to treat sleep apnea.  Date of NPSG, Split Night or HST: 08/27/16, AHI 25.4, SaO2 low 80%.  SLEEP STUDY TECHNIQUE As per the AASM Manual for the Scoring of Sleep and Associated Events v2.3 (April 2016) with a hypopnea requiring 4% desaturations.  The channels recorded and monitored were frontal, central and occipital EEG, electrooculogram (EOG), submentalis EMG (chin), nasal and oral airflow, thoracic and abdominal wall motion, anterior tibialis EMG, snore microphone, electrocardiogram, and pulse oximetry. Continuous positive airway pressure (CPAP) was initiated at the beginning of the study and titrated to treat sleep-disordered breathing.  MEDICATIONS Medications self-administered by patient taken the night of the study : METFORMIN, GLIPIZIDE  TECHNICIAN COMMENTS Comments added by technician: CPAP eliminated all sleep apnea and snoring.  Comments added by scorer: N/A  RESPIRATORY PARAMETERS Optimal PAP Pressure (cm): 8 AHI at Optimal Pressure (/hr): 1.4 Overall Minimal O2 (%): 76.00 Supine % at Optimal Pressure (%): 0 Minimal O2 at Optimal Pressure (%): 76.0    SLEEP ARCHITECTURE The study was initiated at 10:21:45 PM and ended at 5:24:29 AM.  Sleep onset time was 11.0 minutes and the sleep efficiency was 66.4%. The total sleep time was 280.5 minutes.  The patient spent 5.70% of the night in stage N1 sleep, 73.62% in stage N2 sleep, 0.00% in stage N3 and 20.68% in REM.Stage REM latency was 99.5 minutes  Wake after sleep onset was 131.2. Alpha intrusion was absent. Supine sleep was 0.00%.  CARDIAC DATA The 2 lead EKG  demonstrated sinus rhythm. The mean heart rate was 59.09 beats per minute. Other EKG findings include: None.  LEG MOVEMENT DATA The total Periodic Limb Movements of Sleep (PLMS) were 289. The PLMS index was 61.82. A PLMS index of <15 is considered normal in adults.  IMPRESSIONS - The optimal PAP pressure was 8 cm of water. - She did not need supplemental oxygen during the test. - She had an increase in her periodic limb movement index.  DIAGNOSIS - Obstructive Sleep Apnea (G47.33) - Periodic Limb Movements of Sleep (G47.61)  RECOMMENDATIONS - Trial of CPAP therapy on 8 cm H2O. - She was fitted with a Medium size Resmed Nasal Pillow Mask AirFit P10 mask and heated humidification. - She should be assessed for the presence of restless leg syndrome.  [Electronically signed] 10/08/2016 09:51 AM  Chesley Mires MD, [place], American Board of Sleep Medicine   NPI: [id number]

## 2016-10-10 DIAGNOSIS — I10 Essential (primary) hypertension: Secondary | ICD-10-CM | POA: Diagnosis not present

## 2016-10-10 DIAGNOSIS — E1169 Type 2 diabetes mellitus with other specified complication: Secondary | ICD-10-CM | POA: Diagnosis not present

## 2016-10-10 LAB — CBC
HCT: 36.5 % (ref 35.0–45.0)
Hemoglobin: 11.9 g/dL (ref 11.7–15.5)
MCH: 28.1 pg (ref 27.0–33.0)
MCHC: 32.6 g/dL (ref 32.0–36.0)
MCV: 86.1 fL (ref 80.0–100.0)
MPV: 9.7 fL (ref 7.5–12.5)
Platelets: 394 10*3/uL (ref 140–400)
RBC: 4.24 MIL/uL (ref 3.80–5.10)
RDW: 15.4 % — ABNORMAL HIGH (ref 11.0–15.0)
WBC: 7 10*3/uL (ref 3.8–10.8)

## 2016-10-11 ENCOUNTER — Ambulatory Visit (INDEPENDENT_AMBULATORY_CARE_PROVIDER_SITE_OTHER): Payer: Medicare Other | Admitting: Family Medicine

## 2016-10-11 ENCOUNTER — Encounter: Payer: Self-pay | Admitting: Family Medicine

## 2016-10-11 VITALS — BP 140/82 | HR 65 | Resp 14 | Ht 61.0 in | Wt 186.0 lb

## 2016-10-11 DIAGNOSIS — E1169 Type 2 diabetes mellitus with other specified complication: Secondary | ICD-10-CM

## 2016-10-11 DIAGNOSIS — I10 Essential (primary) hypertension: Secondary | ICD-10-CM | POA: Diagnosis not present

## 2016-10-11 DIAGNOSIS — Z0000 Encounter for general adult medical examination without abnormal findings: Secondary | ICD-10-CM

## 2016-10-11 DIAGNOSIS — M25512 Pain in left shoulder: Secondary | ICD-10-CM | POA: Diagnosis not present

## 2016-10-11 DIAGNOSIS — E669 Obesity, unspecified: Secondary | ICD-10-CM

## 2016-10-11 LAB — BASIC METABOLIC PANEL WITH GFR
BUN: 9 mg/dL (ref 7–25)
CO2: 26 mmol/L (ref 20–31)
Calcium: 9.5 mg/dL (ref 8.6–10.4)
Chloride: 103 mmol/L (ref 98–110)
Creat: 0.77 mg/dL (ref 0.50–0.99)
GFR, Est African American: >89 mL/min (ref >=60)
GFR, Est Non African American: 84 mL/min (ref >=60)
Glucose, Bld: 118 mg/dL — ABNORMAL HIGH (ref 65–99)
Potassium: 4.1 mmol/L (ref 3.5–5.3)
Sodium: 141 mmol/L (ref 135–146)

## 2016-10-11 LAB — HEMOGLOBIN A1C
Hgb A1c MFr Bld: 8.1 % — ABNORMAL HIGH (ref <5.7)
Mean Plasma Glucose: 186 mg/dL

## 2016-10-11 LAB — VITAMIN D 25 HYDROXY (VIT D DEFICIENCY, FRACTURES): Vit D, 25-Hydroxy: 46 ng/mL (ref 30–100)

## 2016-10-11 LAB — TSH: TSH: 3.94 mIU/L

## 2016-10-11 MED ORDER — IBUPROFEN 800 MG PO TABS: 800.0000 mg | ORAL_TABLET | Freq: Three times a day (TID) | ORAL | 0 refills | Status: DC

## 2016-10-11 MED ORDER — RANITIDINE HCL 300 MG PO TABS: 300.0000 mg | ORAL_TABLET | Freq: Every day | ORAL | 0 refills | Status: DC

## 2016-10-11 MED ORDER — PREDNISONE 5 MG PO TABS: ORAL_TABLET | ORAL | 0 refills | Status: DC

## 2016-10-11 NOTE — Progress Notes (Signed)
Preventive Screening-Counseling & Management   Patient present here today for a welcome to  Medicare wellness visit. C/o increased and uncontrolled left shoulder pain x 1 month ,  No trauma with reduced mobility, worseningRecent lab review Denies polyuria, polydipsia, blurred vision , or hypoglycemic episodes.    Current Problems (verified)   Medications Prior to Visit Allergies (verified)   PAST HISTORY  Family History  Social History Married for 47 years, 3 sons and 1 stepdaughter. Never smoked    Risk Factors  Current exercise habits: Limited due to bilateral knee pain    Dietary issues discussed: limits carbs and sweets and fried fatty foods    Cardiac risk factors: Pulmonary hypertension, diabetes type 2   Depression Screen  (Note: if answer to either of the following is "Yes", a more complete depression screening is indicated)   Over the past two weeks, have you felt down, depressed or hopeless? No  Over the past two weeks, have you felt little interest or pleasure in doing things? No  Have you lost interest or pleasure in daily life? No  Do you often feel hopeless? No  Do you cry easily over simple problems? No   Activities of Daily Living  In your present state of health, do you have any difficulty performing the following activities?  Driving?: No Managing money?: No Feeding yourself?:No Getting from bed to chair?:No Climbing a flight of stairs?: can climb a few but limited due to chronic knee pain Preparing food and eating?:No Bathing or showering?:No Getting dressed?:No Getting to the toilet?:No Using the toilet?:No Moving around from place to place?: sometimes she uses a cane for ambulation  Fall Risk Assessment In the past year have you fallen or had a near fall?: once Are you currently taking any medications that make you dizzy:No   Hearing Difficulties: No Do you often ask people to speak up or repeat themselves?:No Do you experience ringing or  noises in your ears?:No Do you have difficulty understanding soft or whispered voices?:No  Cognitive Testing  Alert? Yes Normal Appearance?Yes  Oriented to person? Yes Place? Yes  Time? Yes  Displays appropriate judgment?Yes  Can read the correct time from a watch face? yes Are you having problems remembering things?No  Advanced Directives have been discussed with the patient?Yes, information given    List the Names of Other Physician/Practitioners you currently use: Dr Noemi Chapel (ortho) Cecille Rubin (neurology) Mcdowell (cardio)    Indicate any recent Medical Services you may have received from other than [facility] providers in the past year (date may be approximate).     Medicare Attestation  I have personally reviewed:  The patient's medical and social history  Their use of alcohol, tobacco or illicit drugs  Their current medications and supplements  The patient's functional ability including ADLs,fall risks, home safety risks, cognitive, and hearing and visual impairment  Diet and physical activities  Evidence for depression or mood disorders  The patient's weight, height, BMI, and visual acuity have been recorded in the chart. I have made referrals, counseling, and provided education to the patient based on review of the above and I have provided the patient with a written personalized care plan for preventive services.    Physical Exam LMP 08/18/2012   BP 140/82   Pulse 65   Resp 14   Ht 5\' 1"  (1.549 m)   Wt 186 lb (84.4 kg)   LMP 08/18/2012   SpO2 97%   BMI 35.14 kg/m  Pt in pain MS:  Decreased ROM left shoulder with tenderness on anterior aspect and swelling   Assessment & Plan:  Welcome to Medicare preventive visit Welcome to medicare exam as documented. Counseling done  re healthy lifestyle involving commitment to 150 minutes exercise per week, heart healthy diet, and attaining healthy weight.The importance of adequate sleep also discussed. Regular seat belt use  and home safety, is also discussed. Changes in health habits are decided on by the patient with goals and time frames  set for achieving them. Immunization  needs are specifically addressed at this visit Advanced care planning is briefly discussed and information package provided for patient to review , this will be furhter addressed at next visit   Left shoulder pain Acute left shoulder pain and swelling with limitation of movment, no recent trauma, acute bursitis. Short course of oral anti inflammatories prescribed , orthopedic management indicated if no response in next 3 to 5 days , to  call back  Hypertension goal BP (blood pressure) < 140/90 Not at goal, reports being out of one of her meds for past 3 days, impoortance of compliance stressed. DASH diet and commitment to daily physical activity for a minimum of 30 minutes discussed and encouraged, as a part of hypertension management. The importance of attaining a healthy weight is also discussed.  BP/Weight 10/11/2016 08/13/2016 08/01/2016 06/29/2016 06/19/2016 06/10/2876 08/17/6718  Systolic BP 947 096 283 662 947 654 650  Diastolic BP 82 73 60 72 82 80 76  Wt. (Lbs) 186 186.4 184.4 184 183.2 185 -  BMI 35.14 35.22 34.84 34.77 33.51 33.84 -       Diabetes mellitus type 2 in obese ([place]) Deteriorated  Not at goal, unable to afford farxiga and does not want invokana Ms. Hodo is reminded of the importance of commitment to daily physical activity for 30 minutes or more, as able and the need to limit carbohydrate intake to 30 to 60 grams per meal to help with blood sugar control.   The need to take medication as prescribed, test blood sugar as directed, and to call between visits if there is a concern that blood sugar is uncontrolled is also discussed.   Ms. Sittner is reminded of the importance of daily foot exam, annual eye examination, and good blood sugar, blood pressure and cholesterol control.  Diabetic Labs Latest Ref Rng & Units  10/10/2016 06/19/2016 06/06/2016 02/27/2016 02/09/2016  HbA1c <5.7 % 8.1(H) - 7.4(H) 7.3(H) -  Microalbumin Not Estab. ug/mL - - - - 25.6(H)  Micro/Creat Ratio 0.0 - 30.0 mg/g creat - - - - 16.2  Chol <200 mg/dL - - 170 204(H) -  HDL >50 mg/dL - - 67 66 -  Calc LDL <100 mg/dL - - 89 125(H) -  Triglycerides <150 mg/dL - - 69 67 -  Creatinine 0.50 - 0.99 mg/dL 0.77 0.90 0.92 0.88 -   BP/Weight 10/11/2016 08/13/2016 08/01/2016 06/29/2016 06/19/2016 05/15/4654 10/10/2749  Systolic BP 700 174 944 967 591 638 466  Diastolic BP 82 73 60 72 82 80 76  Wt. (Lbs) 186 186.4 184.4 184 183.2 185 -  BMI 35.14 35.22 34.84 34.77 33.51 33.84 -   Foot/eye exam completion dates Latest Ref Rng & Units 04/03/2016 10/19/2015  Eye Exam No Retinopathy No Retinopathy -  Foot Form Completion - - Done    Updated lab needed at/ before next visit.

## 2016-10-11 NOTE — Patient Instructions (Addendum)
Annual physical exam in 3.5 month, call if you need me sooner  Medication is being sent in for left shoulder pain  Please stop sodas  Please work on healthy lifestyle  Blood sugar needs to improve and blood pressure  Ibuprofen and prednisone are both sent short term for left shoulder pain  Thank you  for choosing [facility]. We consider it a privelige to serve you.  Delivering excellent health care in a caring and  compassionate way is our goal.  Partnering with you,  so that together we can achieve this goal is our strategy.    Information on  Advanced directive sis provided for your review please bring back to next visit  It is important that you exercise regularly at least 30 minutes 5 times a week. If you develop chest pain, have severe difficulty breathing, or feel very tired, stop exercising immediately and seek medical attention  Please work on good  health habits so that your health will improve. 1. Commitment to daily physical activity for 30 to 60  minutes, if you are able to do this.  2. Commitment to wise food choices. Aim for half of your  food intake to be vegetable and fruit, one quarter starchy foods, and one quarter protein. Try to eat on a regular schedule  3 meals per day, snacking between meals should be limited to vegetables or fruits or small portions of nuts. 64 ounces of water per day is generally recommended, unless you have specific health conditions, like heart failure or kidney failure where you will need to limit fluid intake.  3. Commitment to sufficient and a  good quality of physical and mental rest daily, generally between 6 to 8 hours per day.  WITH PERSISTANCE AND PERSEVERANCE, THE IMPOSSIBLE , BECOMES THE NORM!

## 2016-10-12 ENCOUNTER — Telehealth: Payer: Self-pay | Admitting: Family Medicine

## 2016-10-12 ENCOUNTER — Other Ambulatory Visit: Payer: Self-pay | Admitting: Family Medicine

## 2016-10-12 MED ORDER — CYCLOBENZAPRINE HCL 10 MG PO TABS: ORAL_TABLET | ORAL | 0 refills | Status: DC

## 2016-10-12 NOTE — Telephone Encounter (Signed)
Patient called nurse line. She states that she forgot to talk to Dr Moshe Cipro yesterday at appointment. But she wants to know if she can get the blood sugar monitor where you don't have to stick yourself. She says if not, her insurance will give her a free One Touch 2 meter and supplies.   She states also that she was given ibuprofen and prednisone but asks for a muscle relaxer as well. She states that she is in a lot of pain today and may have to see ortho if it doesn't get better.

## 2016-10-12 NOTE — Telephone Encounter (Signed)
If her insurance covers, yes, we will not know, so she does the research and gets back to [place], same number of testing times pre day , othwerise please prescribe the meter she has requested I have sent a muscle relaxer to her local pharmacy, and remind her ABSOLUTELY call in for orthos referrla sooner rather than later if pain and discomfort continue

## 2016-10-13 ENCOUNTER — Telehealth: Payer: Self-pay | Admitting: Family Medicine

## 2016-10-13 DIAGNOSIS — Z0000 Encounter for general adult medical examination without abnormal findings: Secondary | ICD-10-CM | POA: Insufficient documentation

## 2016-10-13 DIAGNOSIS — I272 Pulmonary hypertension, unspecified: Secondary | ICD-10-CM

## 2016-10-13 DIAGNOSIS — I10 Essential (primary) hypertension: Secondary | ICD-10-CM

## 2016-10-13 DIAGNOSIS — E785 Hyperlipidemia, unspecified: Secondary | ICD-10-CM

## 2016-10-13 DIAGNOSIS — E1169 Type 2 diabetes mellitus with other specified complication: Secondary | ICD-10-CM

## 2016-10-13 DIAGNOSIS — M25512 Pain in left shoulder: Secondary | ICD-10-CM | POA: Insufficient documentation

## 2016-10-13 DIAGNOSIS — E669 Obesity, unspecified: Secondary | ICD-10-CM

## 2016-10-13 DIAGNOSIS — E1159 Type 2 diabetes mellitus with other circulatory complications: Principal | ICD-10-CM

## 2016-10-13 NOTE — Telephone Encounter (Signed)
Pt needs microalb, non fast HBA1C chem 7 and EGFr 1 week before her Dec visit pls order and mail to her / let her know Thanks

## 2016-10-13 NOTE — Assessment & Plan Note (Signed)
Not at goal, reports being out of one of her meds for past 3 days, impoortance of compliance stressed. DASH diet and commitment to daily physical activity for a minimum of 30 minutes discussed and encouraged, as a part of hypertension management. The importance of attaining a healthy weight is also discussed.  BP/Weight 10/11/2016 08/13/2016 08/01/2016 06/29/2016 06/19/2016 06/18/7528 0/07/1100  Systolic BP 111 735 670 141 030 131 438  Diastolic BP 82 73 60 72 82 80 76  Wt. (Lbs) 186 186.4 184.4 184 183.2 185 -  BMI 35.14 35.22 34.84 34.77 33.51 33.84 -

## 2016-10-13 NOTE — Assessment & Plan Note (Addendum)
Welcome to medicare exam as documented. Counseling done  re healthy lifestyle involving commitment to 150 minutes exercise per week, heart healthy diet, and attaining healthy weight.The importance of adequate sleep also discussed. Regular seat belt use and home safety, is also discussed. Changes in health habits are decided on by the patient with goals and time frames  set for achieving them. Immunization  needs are specifically addressed at this visit Advanced care planning is briefly discussed and information package provided for patient to review , this will be furhter addressed at next visit

## 2016-10-13 NOTE — Assessment & Plan Note (Signed)
Acute left shoulder pain and swelling with limitation of movment, no recent trauma, acute bursitis. Short course of oral anti inflammatories prescribed , orthopedic management indicated if no response in next 3 to 5 days , to  call back

## 2016-10-13 NOTE — Assessment & Plan Note (Signed)
Deteriorated  Not at goal, unable to afford farxiga and does not want invokana Ms. Romito is reminded of the importance of commitment to daily physical activity for 30 minutes or more, as able and the need to limit carbohydrate intake to 30 to 60 grams per meal to help with blood sugar control.   The need to take medication as prescribed, test blood sugar as directed, and to call between visits if there is a concern that blood sugar is uncontrolled is also discussed.   Ms. Veltri is reminded of the importance of daily foot exam, annual eye examination, and good blood sugar, blood pressure and cholesterol control.  Diabetic Labs Latest Ref Rng & Units 10/10/2016 06/19/2016 06/06/2016 02/27/2016 02/09/2016  HbA1c <5.7 % 8.1(H) - 7.4(H) 7.3(H) -  Microalbumin Not Estab. ug/mL - - - - 25.6(H)  Micro/Creat Ratio 0.0 - 30.0 mg/g creat - - - - 16.2  Chol <200 mg/dL - - 170 204(H) -  HDL >50 mg/dL - - 67 66 -  Calc LDL <100 mg/dL - - 89 125(H) -  Triglycerides <150 mg/dL - - 69 67 -  Creatinine 0.50 - 0.99 mg/dL 0.77 0.90 0.92 0.88 -   BP/Weight 10/11/2016 08/13/2016 08/01/2016 06/29/2016 06/19/2016 03/18/3843 05/16/4678  Systolic BP 321 224 825 003 704 888 916  Diastolic BP 82 73 60 72 82 80 76  Wt. (Lbs) 186 186.4 184.4 184 183.2 185 -  BMI 35.14 35.22 34.84 34.77 33.51 33.84 -   Foot/eye exam completion dates Latest Ref Rng & Units 04/03/2016 10/19/2015  Eye Exam No Retinopathy No Retinopathy -  Foot Form Completion - - Done    Updated lab needed at/ before next visit.

## 2016-10-15 ENCOUNTER — Other Ambulatory Visit: Payer: Self-pay | Admitting: Family Medicine

## 2016-10-15 ENCOUNTER — Telehealth: Payer: Self-pay | Admitting: Family Medicine

## 2016-10-15 DIAGNOSIS — G8929 Other chronic pain: Secondary | ICD-10-CM

## 2016-10-15 DIAGNOSIS — M25512 Pain in left shoulder: Principal | ICD-10-CM

## 2016-10-15 NOTE — Telephone Encounter (Signed)
Patient left message on nurse line. She states she was seen last week for shoulder pain. She states it is still hurting, she did not get any rest this weekend even with the medication prescribed. She asks what does she need to do next? X-ray? Dr. Percell Miller? Callback# [phone]

## 2016-10-15 NOTE — Telephone Encounter (Signed)
Called patient regarding message below. No answer, left generic message for patient to return call.   

## 2016-10-15 NOTE — Telephone Encounter (Signed)
Done

## 2016-10-15 NOTE — Telephone Encounter (Signed)
Michaela Peterson has appt with Dr. Fredonia Highland at 9:30 this Friday

## 2016-10-15 NOTE — Telephone Encounter (Signed)
I spoke with pt , pls refer asap to  Dr Debroah Loop office re left shouldeer pain , referral entered , will go to [facility]

## 2016-10-17 ENCOUNTER — Other Ambulatory Visit: Payer: Self-pay | Admitting: Orthopedic Surgery

## 2016-10-17 DIAGNOSIS — M542 Cervicalgia: Secondary | ICD-10-CM | POA: Diagnosis not present

## 2016-10-17 DIAGNOSIS — M25512 Pain in left shoulder: Secondary | ICD-10-CM | POA: Diagnosis not present

## 2016-10-18 ENCOUNTER — Other Ambulatory Visit (HOSPITAL_COMMUNITY): Payer: Self-pay | Admitting: Orthopedic Surgery

## 2016-10-18 ENCOUNTER — Other Ambulatory Visit: Payer: Self-pay | Admitting: Orthopedic Surgery

## 2016-10-18 DIAGNOSIS — M25512 Pain in left shoulder: Secondary | ICD-10-CM

## 2016-10-18 DIAGNOSIS — M542 Cervicalgia: Secondary | ICD-10-CM

## 2016-10-19 DIAGNOSIS — M542 Cervicalgia: Secondary | ICD-10-CM | POA: Diagnosis not present

## 2016-10-19 DIAGNOSIS — M25512 Pain in left shoulder: Secondary | ICD-10-CM | POA: Diagnosis not present

## 2016-10-19 NOTE — Telephone Encounter (Signed)
ATC x1, unable to leave a VM. Will try to call back later.

## 2016-10-24 NOTE — Telephone Encounter (Signed)
Pt aware of results.  Order placed for CPAP machine -- pt requested the start date be pushed out to the beginning of Sept d/t her traveling.  Follow up appt schedule in November to allow for the delay in starting CPAP.  Pt scheduled for 01/28/17 at 10:45 with VS. Nothing further needed.

## 2016-10-25 ENCOUNTER — Ambulatory Visit (HOSPITAL_COMMUNITY)
Admission: RE | Admit: 2016-10-25 | Discharge: 2016-10-25 | Disposition: A | Discharge status: Home / Self Care | Payer: Medicare Other | Source: Ambulatory Visit | Attending: Orthopedic Surgery | Admitting: Orthopedic Surgery

## 2016-10-25 DIAGNOSIS — M542 Cervicalgia: Secondary | ICD-10-CM

## 2016-10-25 DIAGNOSIS — M4802 Spinal stenosis, cervical region: Secondary | ICD-10-CM | POA: Diagnosis not present

## 2016-10-25 DIAGNOSIS — M50222 Other cervical disc displacement at C5-C6 level: Secondary | ICD-10-CM | POA: Diagnosis not present

## 2016-10-25 DIAGNOSIS — M25512 Pain in left shoulder: Secondary | ICD-10-CM | POA: Diagnosis present

## 2016-10-25 DIAGNOSIS — M50221 Other cervical disc displacement at C4-C5 level: Secondary | ICD-10-CM | POA: Diagnosis not present

## 2016-10-26 ENCOUNTER — Telehealth: Payer: Self-pay | Admitting: Family Medicine

## 2016-10-26 ENCOUNTER — Other Ambulatory Visit: Payer: Self-pay | Admitting: Family Medicine

## 2016-10-26 MED ORDER — CANAGLIFLOZIN 300 MG PO TABS: 300.0000 mg | ORAL_TABLET | Freq: Every day | ORAL | 1 refills | Status: DC

## 2016-10-26 NOTE — Telephone Encounter (Signed)
Needs appt to see me since she now has to start inslin in the next 2 to 3  weeks also, please let her know and schedule

## 2016-10-26 NOTE — Telephone Encounter (Signed)
Called patient, she states she is not taking farxiga, she came off because it was too expensive. Insurance will pay for [employer]

## 2016-10-26 NOTE — Telephone Encounter (Signed)
She is already on a medication like invokana, which is farxiga and has reached maximum doses of all oral meds. She will need to start long acting insulin short term to control the sugar. I am prescribing this please send in after you speak with her. She continues all of her tablets as before

## 2016-10-26 NOTE — Telephone Encounter (Signed)
Noted will remove the lantus and send in the invokana

## 2016-10-26 NOTE — Telephone Encounter (Signed)
Patient left message on nurse line. She states that she went to ortho in [place]- Dr. Percell Miller. She states he put her on 10 mg Prednisone x48 tablets. She states her blood sugars are running over 300. Asks if a temporary supply of Invokana or something can be called in until she is finished with the Prednisone.   Callback# [phone]

## 2016-10-27 ENCOUNTER — Other Ambulatory Visit: Payer: Medicare Other

## 2016-11-13 DIAGNOSIS — M5412 Radiculopathy, cervical region: Secondary | ICD-10-CM | POA: Diagnosis not present

## 2016-11-13 DIAGNOSIS — M542 Cervicalgia: Secondary | ICD-10-CM | POA: Diagnosis not present

## 2016-11-27 ENCOUNTER — Other Ambulatory Visit: Payer: Self-pay | Admitting: Family Medicine

## 2016-12-19 ENCOUNTER — Encounter (HOSPITAL_COMMUNITY): Payer: Self-pay | Admitting: Oncology

## 2016-12-19 ENCOUNTER — Encounter (HOSPITAL_BASED_OUTPATIENT_CLINIC_OR_DEPARTMENT_OTHER): Payer: Medicare Other | Admitting: Oncology

## 2016-12-19 ENCOUNTER — Encounter (HOSPITAL_COMMUNITY): Payer: Medicare Other | Attending: Oncology

## 2016-12-19 VITALS — BP 141/75 | HR 68 | Temp 97.9°F | Resp 18 | Wt 183.1 lb

## 2016-12-19 DIAGNOSIS — D473 Essential (hemorrhagic) thrombocythemia: Secondary | ICD-10-CM

## 2016-12-19 DIAGNOSIS — D75839 Thrombocytosis, unspecified: Secondary | ICD-10-CM

## 2016-12-19 DIAGNOSIS — Z862 Personal history of diseases of the blood and blood-forming organs and certain disorders involving the immune mechanism: Secondary | ICD-10-CM

## 2016-12-19 DIAGNOSIS — Z23 Encounter for immunization: Secondary | ICD-10-CM

## 2016-12-19 LAB — CBC WITH DIFFERENTIAL/PLATELET
Basophils Absolute: 0.1 10*3/uL (ref 0.0–0.1)
Basophils Relative: 1 %
Eosinophils Absolute: 0.1 10*3/uL (ref 0.0–0.7)
Eosinophils Relative: 1 %
HCT: 35.7 % — ABNORMAL LOW (ref 36.0–46.0)
Hemoglobin: 12.2 g/dL (ref 12.0–15.0)
Lymphocytes Relative: 40 %
Lymphs Abs: 2.9 10*3/uL (ref 0.7–4.0)
MCH: 29.2 pg (ref 26.0–34.0)
MCHC: 34.2 g/dL (ref 30.0–36.0)
MCV: 85.4 fL (ref 78.0–100.0)
Monocytes Absolute: 0.2 10*3/uL (ref 0.1–1.0)
Monocytes Relative: 3 %
Neutro Abs: 4 10*3/uL (ref 1.7–7.7)
Neutrophils Relative %: 55 %
Platelets: 397 10*3/uL (ref 150–400)
RBC: 4.18 MIL/uL (ref 3.87–5.11)
RDW: 14 % (ref 11.5–15.5)
WBC: 7.2 10*3/uL (ref 4.0–10.5)

## 2016-12-19 LAB — COMPREHENSIVE METABOLIC PANEL
ALT: 44 U/L (ref 14–54)
AST: 41 U/L (ref 15–41)
Albumin: 4.3 g/dL (ref 3.5–5.0)
Alkaline Phosphatase: 85 U/L (ref 38–126)
Anion gap: 12 (ref 5–15)
BUN: 21 mg/dL — ABNORMAL HIGH (ref 6–20)
CO2: 25 mmol/L (ref 22–32)
Calcium: 9.6 mg/dL (ref 8.9–10.3)
Chloride: 103 mmol/L (ref 101–111)
Creatinine, Ser: 1.13 mg/dL — ABNORMAL HIGH (ref 0.44–1.00)
GFR calc Af Amer: 60 mL/min — ABNORMAL LOW (ref >60)
GFR calc non Af Amer: 51 mL/min — ABNORMAL LOW (ref >60)
Glucose, Bld: 249 mg/dL — ABNORMAL HIGH (ref 65–99)
Potassium: 3.7 mmol/L (ref 3.5–5.1)
Sodium: 140 mmol/L (ref 135–145)
Total Bilirubin: 0.6 mg/dL (ref 0.3–1.2)
Total Protein: 8.1 g/dL (ref 6.5–8.1)

## 2016-12-19 LAB — IRON AND TIBC
Iron: 41 ug/dL (ref 28–170)
Saturation Ratios: 12 % (ref 10.4–31.8)
TIBC: 335 ug/dL (ref 250–450)
UIBC: 294 ug/dL

## 2016-12-19 LAB — FERRITIN: Ferritin: 86 ng/mL (ref 11–307)

## 2016-12-19 MED ORDER — INFLUENZA VAC SPLIT QUAD 0.5 ML IM SUSY
PREFILLED_SYRINGE | INTRAMUSCULAR | Status: AC
  Filled 2016-12-19: qty 0.5

## 2016-12-19 MED ORDER — INFLUENZA VAC SPLIT QUAD 0.5 ML IM SUSY
0.5000 mL | PREFILLED_SYRINGE | Freq: Once | INTRAMUSCULAR | Status: AC
  Administered 2016-12-19: 0.5 mL via INTRAMUSCULAR

## 2016-12-19 NOTE — Progress Notes (Signed)
Michaela Peterson presents today for injection per MD orders. Fluarix administered IM in left deltoid. Administration without incident. Patient tolerated well. Patient tolerated treatment without incidence. Patient discharged ambulatory and in stable condition from clinic. Patient to follow up as scheduled.

## 2016-12-19 NOTE — Progress Notes (Signed)
[facility] Hematology/Oncology Progress Note  Name: Michaela Peterson      MRN: [id number]   Date: 12/19/2016 Time:1:42 PM   REFERRING PHYSICIAN:  Tula Nakayama, MD (Primary Care Provider)  REASON FOR CONSULT:  Thrombocytosis   DIAGNOSIS:  Thrombocytosis dating back to at least 2008 with preservation of WBC and HGB/RBC/HCT  HISTORY OF PRESENT ILLNESS:   Michaela Peterson is a 61 y.o. female with a medical history significant for uncontrolled DM, obesity, hepatic steatosis, Multiple Sclerosis, H/O IDA, HTN, GERD, gastroparesis, radicular pain, who returns to the [facility] for thrombocytosis.  Patient presents today for continued follow-up with thrombocytosis. She states that she has been doing well except for fatigue. She also has chronic left shoulder pain 3 out of 10 in severity, where she previously had rotator cuff surgery. She has intermittent leg swelling. She's also been stressed out by issues with her adult children. Otherwise she denies any chest pain, shortness breath, abdominal pain, focal weakness.  Review of Systems  Constitutional: Positive for malaise/fatigue. Negative for chills, fever and weight loss.  HENT: Negative for congestion.   Eyes: Negative.   Respiratory: Negative.  Negative for shortness of breath.   Cardiovascular: Negative.  Negative for chest pain.  Gastrointestinal: Negative.  Negative for abdominal pain.  Genitourinary: Negative.   Musculoskeletal: Positive for joint pain (left shoulder).  Skin: Negative.   Neurological: Negative.   Endo/Heme/Allergies: Negative.   Psychiatric/Behavioral: Negative.   All other systems reviewed and are negative. 14 point review of systems was performed and is negative except as detailed under history of present illness and above  PAST MEDICAL HISTORY:   Past Medical History:  Diagnosis Date  . Adnexal mass    Left; followed by Dr. [name]  . Anemia, iron deficiency   . Arthritis     . Chronic low back pain   . Diabetes mellitus, type 2 ([place])   . Essential hypertension   . Gastroparesis   . GERD (gastroesophageal reflux disease)   . MS (multiple sclerosis) ([place])   . Nonalcoholic fatty liver disease   . Obesity   . Sleep apnea 2009    ALLERGIES: Allergies  Allergen Reactions  . Ace Inhibitors Cough  . Aspirin     REACTION: unknown reaction  . Oxycodone-Acetaminophen Hives  . Penicillins Hives  . Statins     NAFLD      MEDICATIONS: I have reviewed the patient's current medications.    Current Outpatient Prescriptions on File Prior to Visit  Medication Sig Dispense Refill  . clobetasol (TEMOVATE) 0.05 % external solution APPLY TO AFFECTED AREA ON SCALP TWICE A DAY (NOT TO FACE, GROIN OR UNDERARMS)  2  . glipiZIDE (GLUCOTROL) 10 MG tablet TAKE 1 TABLET BY MOUTH 2  TIMES DAILY BEFORE MEALS 180 tablet 3  . ibuprofen (ADVIL,MOTRIN) 200 MG tablet Take 200 mg by mouth every 6 (six) hours as needed for moderate pain.     . metFORMIN (GLUCOPHAGE) 1000 MG tablet TAKE 1 TABLET BY MOUTH TWO  TIMES DAILY WITH MEALS 180 tablet 3  . montelukast (SINGULAIR) 10 MG tablet TAKE 1 TABLET (10 MG TOTAL) BY MOUTH AT BEDTIME. 90 tablet 1  . NIFEdipine (ADALAT CC) 90 MG 24 hr tablet TAKE 1 TABLET BY MOUTH  DAILY 90 tablet 3  . pantoprazole (PROTONIX) 40 MG tablet TAKE 1 TABLET (40 MG TOTAL) BY MOUTH 2 (TWO) TIMES DAILY BEFORE A MEAL. (Patient taking differently: Take  40 mg by mouth daily. ) 180 tablet 3  . triamcinolone (NASACORT AQ) 55 MCG/ACT AERO nasal inhaler Place 2 sprays into the nose daily. 1 Inhaler 0  . triamterene-hydrochlorothiazide (MAXZIDE-25) 37.5-25 MG tablet TAKE 1 TABLET BY MOUTH DAILY. 90 tablet 1  . allopurinol (ZYLOPRIM) 100 MG tablet Take 1 tablet (100 mg total) by mouth daily. (Patient not taking: Reported on 12/19/2016) 90 tablet 1  . cetirizine (ZYRTEC) 10 MG tablet Take 1 tablet (10 mg total) by mouth daily. (Patient not taking: Reported on 12/19/2016) 30  tablet 0  . cyclobenzaprine (FLEXERIL) 10 MG tablet One tablet at bedtime , as needed, for pain and spasm (Patient not taking: Reported on 12/19/2016) 30 tablet 0  . docusate sodium (COLACE) 50 MG capsule Take 50 mg by mouth daily as needed for mild constipation.     . mupirocin ointment (BACTROBAN) 2 % Apply 1 application topically 2 (two) times daily. (Patient not taking: Reported on 12/19/2016) 30 g 1  . Polysacchar Iron-FA-B12 (FERREX 150 FORTE) 150-1-25 MG-MG-MCG CAPS Take 1 capsule by mouth daily. (Patient not taking: Reported on 12/19/2016) 90 capsule 1   No current facility-administered medications on file prior to visit.      PAST SURGICAL HISTORY Past Surgical History:  Procedure Laterality Date  . AGILE CAPSULE N/A 08/11/2014   Procedure: AGILE CAPSULE;  Surgeon: Daneil Dolin, MD;  Location: [facility];  Service: Endoscopy;  Laterality: N/A;  0700  . BLADDER SUSPENSION    . CESAREAN SECTION     x2   . CHOLECYSTECTOMY    . COLONOSCOPY  2010   Dr. [name]: tubular adenoma, few scattered diverticula  . COLONOSCOPY N/A 10/01/2013   Dr. Pixie Casino preparation. Normal rectum. Normal colonic mucosa  . DILATION AND CURETTAGE OF UTERUS     4  . ESOPHAGOGASTRODUODENOSCOPY  2010   Dr. [name]: normal esophagus, small hiatal hernia, questionable pale duodenal mucosa but negative for celiac sprue.   . ESOPHAGOGASTRODUODENOSCOPY N/A 10/01/2013   Dr. Rourk:gastric polyps-status post biopsy. Otherwise, normal EGD. fundic gland polyp, negative H.pylori  . GIVENS CAPSULE STUDY N/A 09/08/2014   Poor prep but overall unremarkable   . KNEE ARTHROSCOPY W/ DEBRIDEMENT    . LAPAROSCOPY ABDOMEN DIAGNOSTIC     lysis of adhesions  . ROTATOR CUFF REPAIR    . TENDON LENGTHENING     Left wrist  . UMBILICAL HERNIA REPAIR      FAMILY HISTORY: Family History  Problem Relation Age of Onset  . Hypertension Sister   . Diabetes Sister   . Heart disease Sister   . Kidney failure Sister   . Heart  failure Mother   . Hypertension Mother   . Arthritis Mother   . Stroke Father   . Hypertension Son   . Hypertension Sister   . Hypertension Brother   . Mental illness Brother   . Colon cancer Neg Hx    Mother deceased at the age of 75 secondary to complications associated with heart disease Father deceased at the age of 66 from stroke and complications associated with ESRD. 2 sisters, one with DM and ESRD, the other has HTN and pre-diabetic. 2 brothers, one with schizophrenia and pre-diabeters, the other with pre-diabetes 3 sons, all healthy 1 daughter, healthy  She notes a family history of malignancy in an aunt who passed away from her cancer.  She had a "cancer in the fat."  Her one sister had a "female cancer" "not ovarian."  Treated with hysterectomy,  but keeping both ovaries at time of surgery.  SOCIAL HISTORY: She denies tobacco abuse (including history of), EtOH use (including history of), and illicit drug abuse.  She is a [place] and reports that she is [place].  She is on disability secondary to [place] and joint issues.  She used to work at [employer] as a Quarry manager.  She is married x 40 years.  Her 41st anniversary is this coming September.  Social History   Social History  . Marital status: Married    Spouse name: [name]   . Number of children: 4  . Years of education: 25   Occupational History  . unemployed  Unemployed   Social History Main Topics  . Smoking status: Never Smoker  . Smokeless tobacco: Never Used     Comment: patient lives with a smoker   . Alcohol use No  . Drug use: No  . Sexual activity: Yes   Other Topics Concern  . None   Social History Narrative   Patient lives at home with husband [name].    Patient has 4 children.    Patient has a some college.    Patient is right handed.    Patient is currently not working.     PERFORMANCE STATUS: The patient's performance status is 1 - Symptomatic but completely ambulatory  PHYSICAL EXAM: Most Recent  Vital Signs: Blood pressure (!) 141/75, pulse 68, temperature 97.9 F (36.6 C), temperature source Oral, resp. rate 18, weight 183 lb 1.6 oz (83.1 kg), last menstrual period 08/18/2012, SpO2 98 %.   Physical Exam  Constitutional: She is oriented to person, place, and time and well-developed, well-nourished, and in no distress. No distress.  HENT:  Head: Normocephalic and atraumatic.  Mouth/Throat: Oropharynx is clear and moist. No oropharyngeal exudate.  Eyes: Pupils are equal, round, and reactive to light. Conjunctivae and EOM are normal. No scleral icterus.  Neck: Normal range of motion. Neck supple. No JVD present.  Cardiovascular: Normal rate, regular rhythm and normal heart sounds.  Exam reveals no gallop and no friction rub.   No murmur heard. Pulmonary/Chest: Effort normal and breath sounds normal. No respiratory distress. She has no wheezes. She has no rales.  Abdominal: Soft. Bowel sounds are normal. She exhibits no distension. There is no tenderness. There is no guarding.  Musculoskeletal: Normal range of motion. She exhibits no edema or tenderness.  Lymphadenopathy:    She has no cervical adenopathy.  Neurological: She is alert and oriented to person, place, and time. No cranial nerve deficit. Gait normal.  Skin: Skin is warm and dry. No rash noted. No erythema. No pallor.  Psychiatric: Affect and judgment normal.  Nursing note and vitals reviewed.  LABORATORY DATA:  I have reviewed the data as listed. CBC    Component Value Date/Time   WBC 7.2 12/19/2016 1259   RBC 4.18 12/19/2016 1259   HGB 12.2 12/19/2016 1259   HCT 35.7 (L) 12/19/2016 1259   PLT 397 12/19/2016 1259   MCV 85.4 12/19/2016 1259   MCH 29.2 12/19/2016 1259   MCHC 34.2 12/19/2016 1259   RDW 14.0 12/19/2016 1259   LYMPHSABS 2.9 12/19/2016 1259   MONOABS 0.2 12/19/2016 1259   EOSABS 0.1 12/19/2016 1259   BASOSABS 0.1 12/19/2016 1259   CMP Latest Ref Rng & Units 10/10/2016 06/19/2016 06/06/2016  Glucose 65  - 99 mg/dL 118(H) 158(H) 134(H)  BUN 7 - 25 mg/dL 9 20 16   Creatinine 0.50 - 0.99 mg/dL 0.77 0.90 0.92  Sodium 135 - 146 mmol/L 141 140 139  Potassium 3.5 - 5.3 mmol/L 4.1 3.7 3.9  Chloride 98 - 110 mmol/L 103 103 105  CO2 20 - 31 mmol/L 26 24 27   Calcium 8.6 - 10.4 mg/dL 9.5 9.7 10.1  Total Protein 6.5 - 8.1 g/dL - 8.3(H) 7.4  Total Bilirubin 0.3 - 1.2 mg/dL - 0.7 0.4  Alkaline Phos 38 - 126 U/L - 91 87  AST 15 - 41 U/L - 37 28  ALT 14 - 54 U/L - 51 31(H)     Chemistry      Component Value Date/Time   NA 141 10/10/2016 1051   K 4.1 10/10/2016 1051   CL 103 10/10/2016 1051   CO2 26 10/10/2016 1051   BUN 9 10/10/2016 1051   CREATININE 0.77 10/10/2016 1051      Component Value Date/Time   CALCIUM 9.5 10/10/2016 1051   ALKPHOS 91 06/19/2016 1007   AST 37 06/19/2016 1007   ALT 51 06/19/2016 1007   BILITOT 0.7 06/19/2016 1007     JAK 2 V617F mutation negative CALR mutation negative MPL mutation negative JAK 2 EXON 12 negative        RADIOGRAPHY: I have personally reviewed the radiological images as listed and agreed with the findings in the report.  DG Chest 2 View 04/18/2016 IMPRESSION: No active cardiopulmonary disease. Interval resolution of lingular atelectasis.  ASSESSMENT/PLAN:   Thrombocytosis (HCC)-resolved likely reactive from iron deficiency IDA (iron deficiency anemia) Multiple Sclerosis Iron malabsorption  I reviewed the labs with the patient in detail, she is currently not anemic and thrombocytosis has resolved. Platelets are 397K and hemoglobin is 12.2 g/dL today. Patient has not had anemia or thrombocytosis for over a year now. Therefore I will turn her care back over to her primary care physician, Dr. Moshe Cipro, for routine monitoring of her blood counts. Patient is agreeable to this plan.  Flu vaccine was given today at the patient's request.  Return to clinic PRN in the future.   All questions were answered. The patient knows to call the  clinic with any problems, questions or concerns. We can certainly see the patient much sooner if necessary.  This note is electronically signed by: Twana First, MD  12/19/2016 1:42 PM

## 2016-12-19 NOTE — Patient Instructions (Signed)
[place] at [facility] Discharge Instructions  RECOMMENDATIONS MADE BY THE CONSULTANT AND ANY TEST RESULTS WILL BE SENT TO YOUR REFERRING PHYSICIAN.  You were seen today by Dr. Twana First You were given your flu shot today Follow up as needed   Thank you for choosing [place] at [facility] to provide your oncology and hematology care.  To afford each patient quality time with our provider, please arrive at least 15 minutes before your scheduled appointment time.    If you have a lab appointment with the [place] [facility] please come in thru the  Main Entrance and check in at the main information desk  You need to re-schedule your appointment should you arrive 10 or more minutes late.  We strive to give you quality time with our providers, and arriving late affects you and other patients whose appointments are after yours.  Also, if you no show three or more times for appointments you may be dismissed from the clinic at the providers discretion.     Again, thank you for choosing [facility].  Our hope is that these requests will decrease the amount of time that you wait before being seen by our physicians.       _____________________________________________________________  Should you have questions after your visit to [facility], please contact our office at [phone] between the hours of 8:30 a.m. and 4:30 p.m.  Voicemails left after 4:30 p.m. will not be returned until the following business day.  For prescription refill requests, have your pharmacy contact our office.       Resources For Cancer Patients and their Caregivers ? American Cancer Society: Can assist with transportation, wigs, general needs, runs Look Good Feel Better.        858-545-7239 ? Cancer Care: Provides financial assistance, online support groups, medication/co-pay assistance.  1-800-813-HOPE (212) 082-4372) ? [facility] Assists [place] cancer patients and their families through emotional , educational and financial support.  (407) 422-7064 ? [place] DSS Where to apply for food stamps, Medicaid and utility assistance. 712 475 7106 ? [facility]: Transportation to medical appointments. 314-778-6989 ? Social Security Administration: May apply for disability if have a Stage IV cancer. 838-310-6029 6500457572 ? [employer], Disability and Transit Services: Assists with nutrition, care and transit needs. [place] Support Programs: @10RELATIVEDAYS @ > Cancer Support Group  2nd Tuesday of the month 1pm-2pm, Journey Room  > Creative Journey  3rd Tuesday of the month 1130am-1pm, Journey Room  > Look Good Feel Better  1st Wednesday of the month 10am-12 noon, Journey Room (Call [place] to register [phone])

## 2017-01-01 ENCOUNTER — Other Ambulatory Visit: Payer: Self-pay

## 2017-01-01 MED ORDER — BLOOD GLUCOSE METER KIT: PACK | 0 refills | Status: DC

## 2017-01-01 MED ORDER — MONTELUKAST SODIUM 10 MG PO TABS: ORAL_TABLET | ORAL | 1 refills | Status: DC

## 2017-01-01 MED ORDER — ALLOPURINOL 100 MG PO TABS: 100.0000 mg | ORAL_TABLET | Freq: Every day | ORAL | 1 refills | Status: DC

## 2017-01-24 ENCOUNTER — Other Ambulatory Visit: Payer: Self-pay | Admitting: Internal Medicine

## 2017-01-24 DIAGNOSIS — G4733 Obstructive sleep apnea (adult) (pediatric): Secondary | ICD-10-CM

## 2017-01-28 ENCOUNTER — Ambulatory Visit: Payer: Medicare Other | Admitting: Pulmonary Disease

## 2017-01-29 ENCOUNTER — Telehealth: Payer: Self-pay | Admitting: Internal Medicine

## 2017-01-29 DIAGNOSIS — G4733 Obstructive sleep apnea (adult) (pediatric): Secondary | ICD-10-CM

## 2017-01-29 NOTE — Telephone Encounter (Signed)
Spoke with pt. She is wanting her CPAP order to be sent to [facility]. The original order was sent to [facility] but they are to expensive. Order has been placed again. Nothing further was needed.

## 2017-02-11 DIAGNOSIS — E119 Type 2 diabetes mellitus without complications: Secondary | ICD-10-CM | POA: Diagnosis not present

## 2017-02-11 DIAGNOSIS — E1169 Type 2 diabetes mellitus with other specified complication: Secondary | ICD-10-CM | POA: Diagnosis not present

## 2017-02-11 DIAGNOSIS — I10 Essential (primary) hypertension: Secondary | ICD-10-CM | POA: Diagnosis not present

## 2017-02-12 ENCOUNTER — Encounter: Payer: Medicare Other | Admitting: Family Medicine

## 2017-02-12 LAB — COMPLETE METABOLIC PANEL WITH GFR
AG Ratio: 1.6 (calc) (ref 1.0–2.5)
ALT: 36 U/L — ABNORMAL HIGH (ref 6–29)
AST: 29 U/L (ref 10–35)
Albumin: 4.5 g/dL (ref 3.6–5.1)
Alkaline phosphatase (APISO): 83 U/L (ref 33–130)
BUN: 22 mg/dL (ref 7–25)
CO2: 25 mmol/L (ref 20–32)
Calcium: 9.8 mg/dL (ref 8.6–10.4)
Chloride: 102 mmol/L (ref 98–110)
Creat: 0.99 mg/dL (ref 0.50–0.99)
GFR, Est African American: 71 mL/min/{1.73_m2} (ref >=60)
GFR, Est Non African American: 61 mL/min/{1.73_m2} (ref >=60)
Globulin: 2.8 g/dL (calc) (ref 1.9–3.7)
Glucose, Bld: 144 mg/dL — ABNORMAL HIGH (ref 65–139)
Potassium: 4 mmol/L (ref 3.5–5.3)
Sodium: 138 mmol/L (ref 135–146)
Total Bilirubin: 0.4 mg/dL (ref 0.2–1.2)
Total Protein: 7.3 g/dL (ref 6.1–8.1)

## 2017-02-12 LAB — MICROALBUMIN / CREATININE URINE RATIO
Creatinine, Urine: 159 mg/dL (ref 20–275)
Microalb Creat Ratio: 8 mcg/mg creat (ref <30)
Microalb, Ur: 1.2 mg/dL

## 2017-02-12 LAB — HEMOGLOBIN A1C
Hgb A1c MFr Bld: 7.9 % of total Hgb — ABNORMAL HIGH (ref <5.7)
Mean Plasma Glucose: 180 (calc)
eAG (mmol/L): 10 (calc)

## 2017-02-13 ENCOUNTER — Encounter: Payer: Self-pay | Admitting: Internal Medicine

## 2017-02-13 ENCOUNTER — Ambulatory Visit (INDEPENDENT_AMBULATORY_CARE_PROVIDER_SITE_OTHER): Payer: Medicare Other | Admitting: Family Medicine

## 2017-02-13 ENCOUNTER — Encounter: Payer: Self-pay | Admitting: Family Medicine

## 2017-02-13 VITALS — BP 130/80 | HR 100 | Resp 16 | Ht 61.0 in | Wt 183.0 lb

## 2017-02-13 DIAGNOSIS — Z0000 Encounter for general adult medical examination without abnormal findings: Secondary | ICD-10-CM | POA: Diagnosis not present

## 2017-02-13 DIAGNOSIS — E1169 Type 2 diabetes mellitus with other specified complication: Secondary | ICD-10-CM

## 2017-02-13 DIAGNOSIS — Z1211 Encounter for screening for malignant neoplasm of colon: Secondary | ICD-10-CM | POA: Diagnosis not present

## 2017-02-13 DIAGNOSIS — E669 Obesity, unspecified: Secondary | ICD-10-CM

## 2017-02-13 DIAGNOSIS — E119 Type 2 diabetes mellitus without complications: Secondary | ICD-10-CM

## 2017-02-13 DIAGNOSIS — K219 Gastro-esophageal reflux disease without esophagitis: Secondary | ICD-10-CM

## 2017-02-13 LAB — POC HEMOCCULT BLD/STL (OFFICE/1-CARD/DIAGNOSTIC): Fecal Occult Blood, POC: NEGATIVE

## 2017-02-13 MED ORDER — METFORMIN HCL 1000 MG PO TABS: ORAL_TABLET | ORAL | 1 refills | Status: DC

## 2017-02-13 MED ORDER — SITAGLIPTIN PHOSPHATE 100 MG PO TABS: 100.0000 mg | ORAL_TABLET | Freq: Every day | ORAL | 3 refills | Status: DC

## 2017-02-13 MED ORDER — TRIAMTERENE-HCTZ 37.5-25 MG PO TABS: 1.0000 | ORAL_TABLET | Freq: Every day | ORAL | 1 refills | Status: DC

## 2017-02-13 NOTE — Patient Instructions (Addendum)
F/u 2nd week in March, call if you need me sooner  Blood sugar has improved, goal for next visit is 7.4 or less  New additional medication is generic januvia 100 mg one daily ( if covered)  It is important that you exercise regularly at least 30 minutes 5 times a week. If you develop chest pain, have severe difficulty breathing, or feel very tired, stop exercising immediately and seek medical attention   Non fast hBA1C, chem 7 and eGFr and Vit D 3 days before next appointment  You are referred to Dr Nona Dell office re reflux management  Please work on good  health habits so that your health will improve. 1. Commitment to daily physical activity for 30 to 60  minutes, if you are able to do this.  2. Commitment to wise food choices. Aim for half of your  food intake to be vegetable and fruit, one quarter starchy foods, and one quarter protein. Try to eat on a regular schedule  3 meals per day, snacking between meals should be limited to vegetables or fruits or small portions of nuts. 64 ounces of water per day is generally recommended, unless you have specific health conditions, like heart failure or kidney failure where you will need to limit fluid intake.  3. Commitment to sufficient and a  good quality of physical and mental rest daily, generally between 6 to 8 hours per day.  WITH PERSISTANCE AND PERSEVERANCE, THE IMPOSSIBLE , BECOMES THE NORM! Thank you  for choosing [facility]. We consider it a privelige to serve you.  Delivering excellent health care in a caring and  compassionate way is our goal.  Partnering with you,  so that together we can achieve this goal is our strategy.

## 2017-02-13 NOTE — Assessment & Plan Note (Signed)
Annual exam as documented. Counseling done  re healthy lifestyle involving commitment to 150 minutes exercise per week, heart healthy diet, and attaining healthy weight.The importance of adequate sleep also discussed. Regular seat belt use and home safety, is also discussed.  Immunization and cancer screening needs are specifically addressed at this visit.

## 2017-02-13 NOTE — Assessment & Plan Note (Signed)
C/o epigastric pain and excess burping , needs high dose PPI refilled also refer to GI

## 2017-02-16 ENCOUNTER — Encounter: Payer: Self-pay | Admitting: Family Medicine

## 2017-02-16 NOTE — Progress Notes (Signed)
Michaela Peterson     MRN: [id number]      DOB: 08/30/1955  HPI: Patient is in for annual physical exam. Diabetes management is addressed and medication changes are made based on recent labs C/o excess burping, needs high dose PPI refilled and to be re evaluated by GI Recent labs,  are reviewed. Immunization is reviewed , and  Is up to date.   PE: BP 130/80   Pulse 100   Resp 16   Ht 5\' 1"  (1.549 m)   Wt 183 lb (83 kg)   LMP 08/18/2012   SpO2 96%   BMI 34.58 kg/m   Pleasant  female, alert and oriented x 3, in no cardio-pulmonary distress. Afebrile. HEENT No facial trauma or asymetry. Sinuses non tender.  Extra occullar muscles intact,. External ears normal, tympanic membranes clear. Oropharynx moist, no exudate. Neck: supple, no adenopathy,JVD or thyromegaly.No bruits.  Chest: Clear to ascultation bilaterally.No crackles or wheezes. Non tender to palpation  Breast: No asymetry,no masses or lumps. No tenderness. No nipple discharge or inversion. No axillary or supraclavicular adenopathy  Cardiovascular system; Heart sounds normal,  S1 and  S2 ,no S3.  No murmur, or thrill. Apical beat not displaced Peripheral pulses normal.  Abdomen: Soft, non tender, no organomegaly or masses. No bruits. Bowel sounds normal. No guarding, tenderness or rebound.  Rectal:  Normal sphincter tone. No rectal mass. Guaiac negative stool.  GU: Not examined, asymptomatic and pap not due   Musculoskeletal exam: Full ROM of spine, hips , shoulders and knees. No deformity ,swelling or crepitus noted. No muscle wasting or atrophy.   Neurologic: Cranial nerves 2 to 12 intact. Power, tone ,sensation and reflexes normal throughout. No disturbance in gait. No tremor.  Skin: Intact, no ulceration, erythema , scaling or rash noted. Pigmentation normal throughout  Psych; Normal mood and affect. Judgement and concentration normal   Assessment & Plan:  Annual physical  exam Annual exam as documented. Counseling done  re healthy lifestyle involving commitment to 150 minutes exercise per week, heart healthy diet, and attaining healthy weight.The importance of adequate sleep also discussed. Regular seat belt use and home safety, is also discussed.  Immunization and cancer screening needs are specifically addressed at this visit.   GERD C/o epigastric pain and excess burping , needs high dose PPI refilled also refer to GI  Diabetes mellitus type 2 in obese [facility]) Michaela Peterson is reminded of the importance of commitment to daily physical activity for 30 minutes or more, as able and the need to limit carbohydrate intake to 30 to 60 grams per meal to help with blood sugar control.   The need to take medication as prescribed, test blood sugar as directed, and to call between visits if there is a concern that blood sugar is uncontrolled is also discussed.   Michaela Peterson is reminded of the importance of daily foot exam, annual eye examination, and good blood sugar, blood pressure and cholesterol control. Improved, but not at goal, add januvia 100 mg daily if covered Updated lab needed at/ before next visit.   Diabetic Labs Latest Ref Rng & Units 02/11/2017 12/19/2016 10/10/2016 06/19/2016 06/06/2016  HbA1c <5.7 % of total Hgb 7.9(H) - 8.1(H) - 7.4(H)  Microalbumin mg/dL 1.2 - - - -  Micro/Creat Ratio <30 mcg/mg creat 8 - - - -  Chol <200 mg/dL - - - - 170  HDL >50 mg/dL - - - - 67  Calc LDL <100 mg/dL - - - -  89  Triglycerides <150 mg/dL - - - - 69  Creatinine 0.50 - 0.99 mg/dL 0.99 1.13(H) 0.77 0.90 0.92   BP/Weight 02/13/2017 12/19/2016 10/11/2016 08/13/2016 08/01/2016 06/29/2016 2/77/4128  Systolic BP 786 767 209 470 962 836 629  Diastolic BP 80 75 82 73 60 72 82  Wt. (Lbs) 183 183.1 186 186.4 184.4 184 183.2  BMI 34.58 34.6 35.14 35.22 34.84 34.77 33.51   Foot/eye exam completion dates Latest Ref Rng & Units 02/13/2017 04/03/2016  Eye Exam No Retinopathy - No  Retinopathy  Foot Form Completion - Done -

## 2017-02-16 NOTE — Assessment & Plan Note (Signed)
Michaela Peterson is reminded of the importance of commitment to daily physical activity for 30 minutes or more, as able and the need to limit carbohydrate intake to 30 to 60 grams per meal to help with blood sugar control.   The need to take medication as prescribed, test blood sugar as directed, and to call between visits if there is a concern that blood sugar is uncontrolled is also discussed.   Michaela Peterson is reminded of the importance of daily foot exam, annual eye examination, and good blood sugar, blood pressure and cholesterol control. Improved, but not at goal, add januvia 100 mg daily if covered Updated lab needed at/ before next visit.   Diabetic Labs Latest Ref Rng & Units 02/11/2017 12/19/2016 10/10/2016 06/19/2016 06/06/2016  HbA1c <5.7 % of total Hgb 7.9(H) - 8.1(H) - 7.4(H)  Microalbumin mg/dL 1.2 - - - -  Micro/Creat Ratio <30 mcg/mg creat 8 - - - -  Chol <200 mg/dL - - - - 170  HDL >50 mg/dL - - - - 67  Calc LDL <100 mg/dL - - - - 89  Triglycerides <150 mg/dL - - - - 69  Creatinine 0.50 - 0.99 mg/dL 0.99 1.13(H) 0.77 0.90 0.92   BP/Weight 02/13/2017 12/19/2016 10/11/2016 08/13/2016 08/01/2016 06/29/2016 1/60/7371  Systolic BP 062 694 854 627 035 009 381  Diastolic BP 80 75 82 73 60 72 82  Wt. (Lbs) 183 183.1 186 186.4 184.4 184 183.2  BMI 34.58 34.6 35.14 35.22 34.84 34.77 33.51   Foot/eye exam completion dates Latest Ref Rng & Units 02/13/2017 04/03/2016  Eye Exam No Retinopathy - No Retinopathy  Foot Form Completion - Done -

## 2017-02-19 ENCOUNTER — Other Ambulatory Visit: Payer: Self-pay | Admitting: Family Medicine

## 2017-02-20 ENCOUNTER — Telehealth: Payer: Self-pay | Admitting: Internal Medicine

## 2017-02-20 NOTE — Telephone Encounter (Signed)
Spoke with Raven and advised the last OV we have is from 07/2016. She states the pt needs a face to face encounter to continue on CPAP. I Called pt to make an appt, no answer and left message to call back.

## 2017-02-21 NOTE — Telephone Encounter (Signed)
I spoke with pt and advised her she needed an office visit for CPAP. Pt made an appt with SG on 03/11/17 at 11:45am. Nothing further is needed.

## 2017-03-11 ENCOUNTER — Ambulatory Visit: Payer: Medicare Other | Admitting: Internal Medicine

## 2017-03-11 ENCOUNTER — Encounter: Payer: Self-pay | Admitting: Internal Medicine

## 2017-03-11 VITALS — BP 116/72 | HR 88 | Ht 61.0 in | Wt 186.6 lb

## 2017-03-11 DIAGNOSIS — G4733 Obstructive sleep apnea (adult) (pediatric): Secondary | ICD-10-CM | POA: Diagnosis not present

## 2017-03-11 NOTE — Assessment & Plan Note (Signed)
I think she is motivated to make CPAP work for her now.  She has input from her husband reporting her snoring.  She is hoping for improvement in daytime sleepiness. Plan-start CPAP auto 5-12

## 2017-03-11 NOTE — Patient Instructions (Signed)
Order- DME [facility]- new CPAP auto 5-12, mask of choice, humidifier, supplies, AirView   Dx OSA  Michaela Peterson- please let us and the home care company know if anything is uncomfortable, or you need help, so we can help you get this working well for you.

## 2017-03-11 NOTE — Progress Notes (Signed)
03/11/17-61 year old female never smoker followed for OSA OSA (obstructive sleep apnea) Previous note by Dr DeDios: Pt was diagnosed with OSA in 1990s. She was symptomatic with snoring, gasping, witnessed apneas, unrefreshed sleep. She had a lab study which showed mild OSA and was started on cpap. She could not tolerate the CPAP machine secondary to pressure. She would end up removing her mask in the middle of the night. She stopped using her CPAP therapy after several months.  She continues to be symptomatic. Has snoring, unrefreshed sleep. Occasional witnessed apneas, frequent awakenings.  She naps in pm.  No abnormal behavior and sleep. ESS 15.  Home Sleep Test- 08/27/16-AHI 25.4/hour, desaturation to 80%, body weight 184.6 pounds CPAP titration- 09/26/16 - 8  CWP Work in- OSA; Former AD patient. Per [facility] patient needs face to face for CPAP set up.  Medical problem list includes HBP, GERD, DM 2/gastroparesis, Multiple Sclerosis This office visit was required after recent sleep study, in order to initiate CPAP.  Contact has been made with [facility].  We discussed comfort measures and goals.  Starting CPAP auto 5-12  ROS-see HPI   + = positive Constitutional:    weight loss, night sweats, fevers, chills, + fatigue, lassitude. HEENT:    headaches, difficulty swallowing, tooth/dental problems, sore throat,       sneezing, itching, ear ache, nasal congestion, post nasal drip, snoring CV:    chest pain, orthopnea, PND, swelling in lower extremities, anasarca,                                  dizziness, palpitations Resp:   shortness of breath with exertion or at rest.                productive cough,   non-productive cough, coughing up of blood.              change in color of mucus.  wheezing.   Skin:    rash or lesions. GI:  No-   heartburn, indigestion, abdominal pain, nausea, vomiting, diarrhea,                 change in bowel habits, loss of appetite GU: dysuria, change in color of urine, no  urgency or frequency.   flank pain. MS:   joint pain, stiffness, decreased range of motion, back pain. Neuro-     nothing unusual Psych:  change in mood or affect.  depression or anxiety.   memory loss.  OBJ- Physical Exam General- Alert, Oriented, Affect-appropriate, Distress- none acute Skin- rash-none, lesions- none, excoriation- none Lymphadenopathy- none Head- atraumatic            Eyes- Gross vision intact, PERRLA, conjunctivae and secretions clear            Ears- Hearing, canals-normal            Nose- Clear, no-Septal dev, mucus, polyps, erosion, perforation             Throat- Mallampati IV , mucosa clear , drainage- none, tonsils- atrophic Neck- flexible , trachea midline, no stridor , thyroid nl, carotid no bruit Chest - symmetrical excursion , unlabored           Heart/CV- RRR , no murmur , no gallop  , no rub, nl s1 s2                           -  JVD- none , edema- none, stasis changes- none, varices- none           Lung- clear to P&A, wheeze- none, cough- none , dullness-none, rub- none           Chest wall-  Abd-  Br/ Gen/ Rectal- Not done, not indicated Extrem- cyanosis- none, clubbing, none, atrophy- none, strength- nl Neuro- grossly intact to observation

## 2017-03-27 DIAGNOSIS — G4733 Obstructive sleep apnea (adult) (pediatric): Secondary | ICD-10-CM | POA: Diagnosis not present

## 2017-04-02 ENCOUNTER — Telehealth: Payer: Self-pay | Admitting: Family Medicine

## 2017-04-02 NOTE — Telephone Encounter (Signed)
Patient left message on nurse line asking if Dr. Moshe Cipro could call in some medication to 'dry up' whatever she's got going on. She states she has coughing, sneezing, itchy eyes and ears, runny nose. She states she has taken generic claritin, zyrtec, nasal spray, mucinex.   Callback# [phone]

## 2017-04-03 ENCOUNTER — Other Ambulatory Visit: Payer: Self-pay | Admitting: Family Medicine

## 2017-04-03 MED ORDER — PREDNISONE 5 MG PO TABS: 5.0000 mg | ORAL_TABLET | Freq: Two times a day (BID) | ORAL | 0 refills | Status: AC

## 2017-04-03 MED ORDER — AZELASTINE HCL 0.1 % NA SOLN: 2.0000 | Freq: Two times a day (BID) | NASAL | 12 refills | Status: DC

## 2017-04-03 NOTE — Telephone Encounter (Signed)
Astelin spray and 4 days of prednisone 5 mg are sent to [facility], pls let her know

## 2017-04-04 ENCOUNTER — Telehealth: Payer: Self-pay | Admitting: Internal Medicine

## 2017-04-04 DIAGNOSIS — G4733 Obstructive sleep apnea (adult) (pediatric): Secondary | ICD-10-CM

## 2017-04-04 NOTE — Telephone Encounter (Signed)
We can reduce airflow a little. Please Order [facility] change auto PAP range to 5-10.    She absolutely can adjust the humidifier for her comfort. It should be simple to do- there will be instructions in her manual, or she can call her DME to learn how.  It sounds as if she is being overdried. The medicines she lists are mostly antihistamines, which could make it all worse.  Suggest she try otc nasal saline gel (NeilMed and other brands). Put a little into each nostril with finger tip or Q-tip at bedtime, and any time needed.  She could also try otc Biotene mouth rinse at bedtime. This treatment for dry mouths may help as needed.

## 2017-04-04 NOTE — Telephone Encounter (Signed)
Pt aware.

## 2017-04-04 NOTE — Telephone Encounter (Signed)
Pt aware of recs.  Order placed to [place].  Nothing further needed.

## 2017-04-04 NOTE — Telephone Encounter (Signed)
Pt states since starting cpap last week, pt has had sore throat, itchy eyes, runny nose, watery eyes, pnd with clear mucus.  Denies fever, body aches, chills, chest pain.    Pt was given prednisone X4 days worth and astelin nasal spray by PCP to help with s/s.  Pt also takes claritin and zyrtec maintenance, and has been taking benadryl and ibuprofen as well.  Pt requesting additional recs.    [facility] [place]    CY please advise.  Thanks.

## 2017-04-05 DIAGNOSIS — G4733 Obstructive sleep apnea (adult) (pediatric): Secondary | ICD-10-CM | POA: Diagnosis not present

## 2017-04-16 ENCOUNTER — Encounter: Payer: Self-pay | Admitting: Gastroenterology

## 2017-04-16 ENCOUNTER — Ambulatory Visit: Payer: Medicare Other | Admitting: Gastroenterology

## 2017-04-16 VITALS — BP 136/80 | HR 70 | Temp 97.3°F | Ht 61.0 in | Wt 185.2 lb

## 2017-04-16 DIAGNOSIS — R1013 Epigastric pain: Secondary | ICD-10-CM

## 2017-04-16 MED ORDER — PANTOPRAZOLE SODIUM 40 MG PO TBEC: 40.0000 mg | DELAYED_RELEASE_TABLET | Freq: Two times a day (BID) | ORAL | 3 refills | Status: DC

## 2017-04-16 NOTE — Patient Instructions (Addendum)
I have sent in the Protonix for twice a day, 30 minutes before breakfast and dinner.   I would like to see you in 6 weeks. If you are not significantly better, we need to pursue an upper endoscopy. Let me know if things do not improve in next few weeks regardless.   It was a pleasure to see you today. I strive to create trusting relationships with patients to provide genuine, compassionate, and quality care. I value your feedback. If you receive a survey regarding your visit,  I greatly appreciate you the taking time to fill this out.   Annitta Needs, PhD, ANP-BC [facility]    Food Choices for Gastroesophageal Reflux Disease, Adult When you have gastroesophageal reflux disease (GERD), the foods you eat and your eating habits are very important. Choosing the right foods can help ease your discomfort. What guidelines do I need to follow?  Choose fruits, vegetables, whole grains, and low-fat dairy products.  Choose low-fat meat, fish, and poultry.  Limit fats such as oils, salad dressings, butter, nuts, and avocado.  Keep a food diary. This helps you identify foods that cause symptoms.  Avoid foods that cause symptoms. These may be different for everyone.  Eat small meals often instead of 3 large meals a day.  Eat your meals slowly, in a place where you are relaxed.  Limit fried foods.  Cook foods using methods other than frying.  Avoid drinking alcohol.  Avoid drinking large amounts of liquids with your meals.  Avoid bending over or lying down until 2-3 hours after eating. What foods are not recommended? These are some foods and drinks that may make your symptoms worse: Vegetables Tomatoes. Tomato juice. Tomato and spaghetti sauce. Chili peppers. Onion and garlic. Horseradish. Fruits Oranges, grapefruit, and lemon (fruit and juice). Meats High-fat meats, fish, and poultry. This includes hot dogs, ribs, ham, sausage, salami, and bacon. Dairy Whole milk and  chocolate milk. Sour cream. Cream. Butter. Ice cream. Cream cheese. Drinks Coffee and tea. Bubbly (carbonated) drinks or energy drinks. Condiments Hot sauce. Barbecue sauce. Sweets/Desserts Chocolate and cocoa. Donuts. Peppermint and spearmint. Fats and Oils High-fat foods. This includes [place] fries and potato chips. Other Vinegar. Strong spices. This includes black pepper, white pepper, red pepper, cayenne, curry powder, cloves, ginger, and chili powder. The items listed above may not be a complete list of foods and drinks to avoid. Contact your dietitian for more information. This information is not intended to replace advice given to you by your health care provider. Make sure you discuss any questions you have with your health care provider. Document Released: 08/28/2011 Document Revised: 08/04/2015 Document Reviewed: 12/31/2012 Elsevier Interactive Patient Education  2017 [employer].

## 2017-04-16 NOTE — Progress Notes (Signed)
Referring Provider: [name], MD Primary Care Physician:  [name], MD Primary GI: Dr. [name]   Chief Complaint  Patient presents with  . Gastroesophageal Reflux    f/u.     HPI:   Michaela Peterson is a 62 y.o. female presenting today with a history of low normal ferritin, low iron, Hgb 12 range historically. Colonoscopy/EGD in July 2015 and capsule unremarkable June 2016. Noted constipation in the past and offered Linzess, but she wanted to try Miralax instead. Insurance won't cover [place]. Also history of gastroparesis. Heme negative recently.   Drinks pepsi to relieve pressure in her upper abdomen. Has LUQ/epigastric discomfort intermittently.  Going on for about 6 months. LUQ pain noted but unable to attribute to anything. Thinks it feels better after a BM. Doesn't hurt all the time. Taking Protonix once daily and had been taking it BID. States she needed to see GI again before having it filled BID. No dysphagia. No nausea. Doesn't feel like she is constipated. Tries to take Ibuprofen sparingly.   A1c recently 7.9.   Past Medical History:  Diagnosis Date  . Adnexal mass    Left; followed by Dr. [name]  . Anemia, iron deficiency   . Arthritis   . Chronic low back pain   . Diabetes mellitus, type 2 ([place])   . Essential hypertension   . Gastroparesis   . GERD (gastroesophageal reflux disease)   . MS (multiple sclerosis) ([place])   . Nonalcoholic fatty liver disease   . Obesity   . Sleep apnea 2009    Past Surgical History:  Procedure Laterality Date  . AGILE CAPSULE N/A 08/11/2014   Procedure: AGILE CAPSULE;  Surgeon: Daneil Dolin, MD;  Location: [facility];  Service: Endoscopy;  Laterality: N/A;  0700  . BLADDER SUSPENSION    . CESAREAN SECTION     x2   . CHOLECYSTECTOMY    . COLONOSCOPY  2010   Dr. [name]: tubular adenoma, few scattered diverticula  . COLONOSCOPY N/A 10/01/2013   Dr. Pixie Casino preparation. Normal rectum. Normal colonic  mucosa  . DILATION AND CURETTAGE OF UTERUS     4  . ESOPHAGOGASTRODUODENOSCOPY  2010   Dr. [name]: normal esophagus, small hiatal hernia, questionable pale duodenal mucosa but negative for celiac sprue.   . ESOPHAGOGASTRODUODENOSCOPY N/A 10/01/2013   Dr. Rourk:gastric polyps-status post biopsy. Otherwise, normal EGD. fundic gland polyp, negative H.pylori  . GIVENS CAPSULE STUDY N/A 09/08/2014   Poor prep but overall unremarkable   . KNEE ARTHROSCOPY W/ DEBRIDEMENT    . LAPAROSCOPY ABDOMEN DIAGNOSTIC     lysis of adhesions  . ROTATOR CUFF REPAIR    . TENDON LENGTHENING     Left wrist  . UMBILICAL HERNIA REPAIR      Current Outpatient Medications  Medication Sig Dispense Refill  . allopurinol (ZYLOPRIM) 100 MG tablet Take 1 tablet (100 mg total) by mouth daily. 90 tablet 1  . azelastine (ASTELIN) 0.1 % nasal spray Place 2 sprays into both nostrils 2 (two) times daily. Use in each nostril as directed 30 mL 12  . blood glucose meter kit and supplies Dispense based on patient and insurance preference. Use up to four times daily as directed. (FOR ICD-9 250.00, 250.01). 1 each 0  . cetirizine (ZYRTEC) 10 MG tablet Take 1 tablet (10 mg total) by mouth daily. 30 tablet 0  . clobetasol (TEMOVATE) 0.05 % external solution APPLY TO AFFECTED AREA ON SCALP TWICE A DAY (NOT  TO FACE, GROIN OR UNDERARMS)  2  . docusate sodium (COLACE) 50 MG capsule Take 50 mg by mouth daily as needed for mild constipation.     Marland Kitchen glipiZIDE (GLUCOTROL) 10 MG tablet TAKE 1 TABLET BY MOUTH 2  TIMES DAILY BEFORE MEALS 180 tablet 3  . ibuprofen (ADVIL,MOTRIN) 200 MG tablet Take 200 mg by mouth every 6 (six) hours as needed for moderate pain.     . metFORMIN (GLUCOPHAGE) 1000 MG tablet TAKE 1 TABLET BY MOUTH TWO  TIMES DAILY WITH MEALS 180 tablet 1  . methocarbamol (ROBAXIN) 500 MG tablet Take 500 mg by mouth every 8 (eight) hours as needed for muscle spasms.    . montelukast (SINGULAIR) 10 MG tablet TAKE 1 TABLET (10 MG TOTAL)  BY MOUTH AT BEDTIME. 90 tablet 1  . mupirocin ointment (BACTROBAN) 2 % Apply 1 application topically 2 (two) times daily. 30 g 1  . NIFEdipine (ADALAT CC) 90 MG 24 hr tablet TAKE 1 TABLET BY MOUTH  DAILY 90 tablet 3  . ONE TOUCH ULTRA TEST test strip USE UP TO 4 TIMES DAILY AS  DIRECTED 200 each 0  . ONETOUCH DELICA LANCETS 44Y MISC USE UP TO 4 TIMES DAILY AS  DIRECTED 200 each 0  . pantoprazole (PROTONIX) 40 MG tablet TAKE 1 TABLET (40 MG TOTAL) BY MOUTH 2 (TWO) TIMES DAILY BEFORE A MEAL. (Patient taking differently: Take 40 mg by mouth daily. ) 180 tablet 3  . triamcinolone (NASACORT AQ) 55 MCG/ACT AERO nasal inhaler Place 2 sprays into the nose daily. 1 Inhaler 0  . triamterene-hydrochlorothiazide (MAXZIDE-25) 37.5-25 MG tablet Take 1 tablet by mouth daily. 90 tablet 1   No current facility-administered medications for this visit.     Allergies as of 04/16/2017 - Review Complete 04/16/2017  Allergen Reaction Noted  . Gabapentin Shortness Of Breath 02/13/2017  . Ace inhibitors Cough 03/26/2012  . Aspirin    . Oxycodone-acetaminophen Hives 02/21/2007  . Penicillins Hives   . Statins  10/11/2012    Family History  Problem Relation Age of Onset  . Hypertension Sister   . Diabetes Sister   . Heart disease Sister   . Kidney failure Sister   . Heart failure Mother   . Hypertension Mother   . Arthritis Mother   . Stroke Father   . Hypertension Son   . Hypertension Sister   . Hypertension Brother   . Mental illness Brother   . Colon cancer Neg Hx     Social History   Socioeconomic History  . Marital status: Married    Spouse name: [name]   . Number of children: 4  . Years of education: 97  . Highest education level: None  Social Needs  . Financial resource strain: None  . Food insecurity - worry: None  . Food insecurity - inability: None  . Transportation needs - medical: None  . Transportation needs - non-medical: None  Occupational History  . Occupation: unemployed      Fish farm manager: UNEMPLOYED  Tobacco Use  . Smoking status: Never Smoker  . Smokeless tobacco: Never Used  . Tobacco comment: patient lives with a smoker   Substance and Sexual Activity  . Alcohol use: No  . Drug use: No  . Sexual activity: Yes  Other Topics Concern  . None  Social History Narrative   Patient lives at home with husband [name].    Patient has 4 children.    Patient has a some college.  Patient is right handed.    Patient is currently not working.     Review of Systems: Gen: Denies fever, chills, anorexia. Denies fatigue, weakness, weight loss.  CV: Denies chest pain, palpitations, syncope, peripheral edema, and claudication. Resp: Denies dyspnea at rest, cough, wheezing, coughing up blood, and pleurisy. GI: see HPI  Derm: Denies rash, itching, dry skin Psych: Denies depression, anxiety, memory loss, confusion. No homicidal or suicidal ideation.  Heme: Denies bruising, bleeding, and enlarged lymph nodes.  Physical Exam: BP 136/80   Pulse 70   Temp (!) 97.3 F (36.3 C) (Oral)   Ht 5' 1"  (1.549 m)   Wt 185 lb 3.2 oz (84 kg)   LMP 08/18/2012   BMI 34.99 kg/m  General:   Alert and oriented. No distress noted. Pleasant and cooperative.  Head:  Normocephalic and atraumatic. Eyes:  Conjuctiva clear without scleral icterus. Mouth:  Oral mucosa pink and moist.  Abdomen:  +BS, soft, mild discomfort upper abdomen and non-distended. No rebound or guarding. No HSM or masses noted. Msk:  Symmetrical without gross deformities. Normal posture. Extremities:  Without edema. Neurologic:  Alert and  oriented x4 Psych:  Alert and cooperative. Normal mood and affect.  Lab Results  Component Value Date   WBC 7.2 12/19/2016   HGB 12.2 12/19/2016   HCT 35.7 (L) 12/19/2016   MCV 85.4 12/19/2016   PLT 397 12/19/2016   Lab Results  Component Value Date   ALT 36 (H) 02/11/2017   AST 29 02/11/2017   ALKPHOS 85 12/19/2016   BILITOT 0.4 02/11/2017   Lab Results  Component  Value Date   CREATININE 0.99 02/11/2017   BUN 22 02/11/2017   NA 138 02/11/2017   K 4.0 02/11/2017   CL 102 02/11/2017   CO2 25 02/11/2017

## 2017-04-16 NOTE — Progress Notes (Signed)
CC'ED TO PCP 

## 2017-04-16 NOTE — Assessment & Plan Note (Signed)
62 year old female with history of chronic upper abdominal pain, last EGD in 2015. Known history of GERD and gastroparesis. Vague symptoms of pressure in upper abdomen, needing pepsi to assist with burping and relief thereafter. Likely symptoms multifactorial. No dysphagia, nausea, vomiting. She does take Ibuprofen, but this is sparingly. Will increase PPI to BID again for a short course. Return in 6 weeks for close follow-up. GERD diet discussed. May need updated EGD.

## 2017-04-19 ENCOUNTER — Telehealth: Payer: Self-pay

## 2017-04-19 NOTE — Telephone Encounter (Signed)
Spoke with Rep from [facility] in reference to a fax received for medical clarification. Rep needed Prescribing [facility] NPI #. [facility] [id number]

## 2017-04-26 ENCOUNTER — Other Ambulatory Visit: Payer: Self-pay | Admitting: Family Medicine

## 2017-04-27 DIAGNOSIS — G4733 Obstructive sleep apnea (adult) (pediatric): Secondary | ICD-10-CM | POA: Diagnosis not present

## 2017-05-02 ENCOUNTER — Other Ambulatory Visit: Payer: Self-pay

## 2017-05-02 MED ORDER — GLUCOSE BLOOD VI STRP: ORAL_STRIP | 1 refills | Status: DC

## 2017-05-02 MED ORDER — ONETOUCH DELICA LANCETS 33G MISC: 1 refills | Status: DC

## 2017-05-20 ENCOUNTER — Telehealth: Payer: Self-pay

## 2017-05-20 DIAGNOSIS — E1159 Type 2 diabetes mellitus with other circulatory complications: Principal | ICD-10-CM

## 2017-05-20 DIAGNOSIS — E669 Obesity, unspecified: Secondary | ICD-10-CM

## 2017-05-20 DIAGNOSIS — E1169 Type 2 diabetes mellitus with other specified complication: Secondary | ICD-10-CM | POA: Diagnosis not present

## 2017-05-20 DIAGNOSIS — E785 Hyperlipidemia, unspecified: Secondary | ICD-10-CM

## 2017-05-20 DIAGNOSIS — I152 Hypertension secondary to endocrine disorders: Secondary | ICD-10-CM

## 2017-05-20 DIAGNOSIS — I10 Essential (primary) hypertension: Secondary | ICD-10-CM

## 2017-05-20 NOTE — Telephone Encounter (Signed)
Labs ordered.

## 2017-05-21 ENCOUNTER — Ambulatory Visit (INDEPENDENT_AMBULATORY_CARE_PROVIDER_SITE_OTHER): Payer: Medicare Other | Admitting: Family Medicine

## 2017-05-21 ENCOUNTER — Encounter: Payer: Self-pay | Admitting: Family Medicine

## 2017-05-21 VITALS — BP 160/84 | HR 56 | Resp 16 | Ht 61.0 in | Wt 184.0 lb

## 2017-05-21 DIAGNOSIS — I10 Essential (primary) hypertension: Secondary | ICD-10-CM | POA: Diagnosis not present

## 2017-05-21 DIAGNOSIS — E119 Type 2 diabetes mellitus without complications: Secondary | ICD-10-CM

## 2017-05-21 DIAGNOSIS — F411 Generalized anxiety disorder: Secondary | ICD-10-CM

## 2017-05-21 DIAGNOSIS — E669 Obesity, unspecified: Secondary | ICD-10-CM

## 2017-05-21 DIAGNOSIS — E1169 Type 2 diabetes mellitus with other specified complication: Secondary | ICD-10-CM

## 2017-05-21 DIAGNOSIS — L309 Dermatitis, unspecified: Secondary | ICD-10-CM | POA: Diagnosis not present

## 2017-05-21 DIAGNOSIS — E785 Hyperlipidemia, unspecified: Secondary | ICD-10-CM | POA: Diagnosis not present

## 2017-05-21 DIAGNOSIS — E1159 Type 2 diabetes mellitus with other circulatory complications: Secondary | ICD-10-CM

## 2017-05-21 LAB — BASIC METABOLIC PANEL WITH GFR
BUN/Creatinine Ratio: 17 (calc) (ref 6–22)
BUN: 18 mg/dL (ref 7–25)
CO2: 26 mmol/L (ref 20–32)
Calcium: 9.8 mg/dL (ref 8.6–10.4)
Chloride: 102 mmol/L (ref 98–110)
Creat: 1.06 mg/dL — ABNORMAL HIGH (ref 0.50–0.99)
GFR, Est African American: 66 mL/min/{1.73_m2} (ref >=60)
GFR, Est Non African American: 57 mL/min/{1.73_m2} — ABNORMAL LOW (ref >=60)
Glucose, Bld: 110 mg/dL (ref 65–139)
Potassium: 4.2 mmol/L (ref 3.5–5.3)
Sodium: 139 mmol/L (ref 135–146)

## 2017-05-21 LAB — HEMOGLOBIN A1C
Hgb A1c MFr Bld: 7.6 % of total Hgb — ABNORMAL HIGH (ref <5.7)
Mean Plasma Glucose: 171 (calc)
eAG (mmol/L): 9.5 (calc)

## 2017-05-21 LAB — VITAMIN D 25 HYDROXY (VIT D DEFICIENCY, FRACTURES): Vit D, 25-Hydroxy: 49 ng/mL (ref 30–100)

## 2017-05-21 MED ORDER — TERBINAFINE HCL 1 % EX CREA: TOPICAL_CREAM | CUTANEOUS | 0 refills | Status: DC

## 2017-05-21 NOTE — Patient Instructions (Addendum)
F/U in 4 .5 months. Call if you need me before  Labs are improved, congrats    Call if you want to talk to the therapist  Fasting lipid, cmp and EGFR and hBa1C 3 days before next visit  Cream sent in for your rash  Need to start going back to the movies!!!

## 2017-05-25 DIAGNOSIS — G4733 Obstructive sleep apnea (adult) (pediatric): Secondary | ICD-10-CM | POA: Diagnosis not present

## 2017-05-27 ENCOUNTER — Ambulatory Visit: Payer: Medicare Other | Admitting: Internal Medicine

## 2017-05-27 ENCOUNTER — Encounter: Payer: Self-pay | Admitting: Family Medicine

## 2017-05-27 DIAGNOSIS — F411 Generalized anxiety disorder: Secondary | ICD-10-CM | POA: Insufficient documentation

## 2017-05-27 DIAGNOSIS — L309 Dermatitis, unspecified: Secondary | ICD-10-CM | POA: Insufficient documentation

## 2017-05-27 NOTE — Assessment & Plan Note (Signed)
.  Elevated and uncontrolled DASH diet and commitment to daily physical activity for a minimum of 30 minutes discussed and encouraged, as a part of hypertension management. The importance of attaining a healthy weight is also discussed.  BP/Weight 05/21/2017 04/16/2017 03/11/2017 02/13/2017 12/19/2016 08/10/4429 07/13/84  Systolic BP 761 950 932 671 245 809 983  Diastolic BP 84 80 72 80 75 82 73  Wt. (Lbs) 184 185.2 186.63 183 183.1 186 186.4  BMI 34.77 34.99 35.26 34.58 34.6 35.14 35.22

## 2017-05-27 NOTE — Assessment & Plan Note (Signed)
Hyperlipidemia:Low fat diet discussed and encouraged.   Lipid Panel  Lab Results  Component Value Date   CHOL 170 06/06/2016   HDL 67 06/06/2016   LDLCALC 89 06/06/2016   TRIG 69 06/06/2016   CHOLHDL 2.5 06/06/2016   Updated lab needed at/ before next visit.

## 2017-05-27 NOTE — Progress Notes (Signed)
Michaela Peterson     MRN: [id number]      DOB: 1955-12-11   HPI Ms. Banwart is here for follow up and re-evaluation of chronic medical conditions, medication management and review of any available recent lab and radiology data.  Preventive health is updated, specifically  Cancer screening and Immunization.   Questions or concerns regarding consultations or procedures which the PT has had in the interim are  addressed. The PT denies any adverse reactions to current medications since the last visit.  C/o fatigue, does report increased stress and anxiety regarding her son C/o rash on scalp which is itchy ROS Denies recent fever or chills. Denies sinus pressure, nasal congestion, ear pain or sore throat. Denies chest congestion, productive cough or wheezing. Denies chest pains, palpitations and leg swelling Denies abdominal pain, nausea, vomiting,diarrhea or constipation.   Denies dysuria, frequency, hesitancy or incontinence. Denies joint pain, swelling and limitation in mobility. Denies headaches, seizures, numbness, or tingling. Denies depression,  C/o increased anxiety and mild insomnia. C/o itchy rash  On scalp PE  BP (!) 160/84   Pulse (!) 56   Resp 16   Ht 5\' 1"  (1.549 m)   Wt 184 lb (83.5 kg)   LMP 08/18/2012   SpO2 98%   BMI 34.77 kg/m   Patient alert and oriented and in no cardiopulmonary distress.  HEENT: No facial asymmetry, EOMI,   oropharynx pink and moist.  Neck supple no JVD, no mass.  Chest: adequate air entry bilaterally CVS: S1, S2 no murmurs, no S3.Regular rate.  ABD: Soft non tender.   Ext: No edema  MS: Adequate ROM spine, shoulders, hips and knees.  Skin: Intact,  rash noted.on scalp  Psych: Good eye contact, normal affect. Memory intact not anxious or depressed appearing.  CNS: CN 2-12 intact, power,  normal throughout.no focal deficits noted.   Assessment & Plan Hypertension goal BP (blood pressure) < 140/90 .Elevated and uncontrolled DASH  diet and commitment to daily physical activity for a minimum of 30 minutes discussed and encouraged, as a part of hypertension management. The importance of attaining a healthy weight is also discussed.  BP/Weight 05/21/2017 04/16/2017 03/11/2017 02/13/2017 12/19/2016 03/19/5629 06/18/7024  Systolic BP 378 588 502 774 128 786 767  Diastolic BP 84 80 72 80 75 82 73  Wt. (Lbs) 184 185.2 186.63 183 183.1 186 186.4  BMI 34.77 34.99 35.26 34.58 34.6 35.14 35.22       Diabetes mellitus type 2 in obese [facility]) Ms. Soule is reminded of the importance of commitment to daily physical activity for 30 minutes or more, as able and the need to limit carbohydrate intake to 30 to 60 grams per meal to help with blood sugar control.   The need to take medication as prescribed, test blood sugar as directed, and to call between visits if there is a concern that blood sugar is uncontrolled is also discussed.   Ms. Yellin is reminded of the importance of daily foot exam, annual eye examination, and good blood sugar, blood pressure and cholesterol control. Improved  Diabetic Labs Latest Ref Rng & Units 05/20/2017 02/11/2017 12/19/2016 10/10/2016 06/19/2016  HbA1c <5.7 % of total Hgb 7.6(H) 7.9(H) - 8.1(H) -  Microalbumin mg/dL - 1.2 - - -  Micro/Creat Ratio <30 mcg/mg creat - 8 - - -  Chol <200 mg/dL - - - - -  HDL >50 mg/dL - - - - -  Calc LDL <100 mg/dL - - - - -  Triglycerides <150 mg/dL - - - - -  Creatinine 0.50 - 0.99 mg/dL 1.06(H) 0.99 1.13(H) 0.77 0.90   BP/Weight 05/21/2017 04/16/2017 03/11/2017 02/13/2017 12/19/2016 06/14/4096 03/12/9145  Systolic BP 829 562 130 865 784 696 295  Diastolic BP 84 80 72 80 75 82 73  Wt. (Lbs) 184 185.2 186.63 183 183.1 186 186.4  BMI 34.77 34.99 35.26 34.58 34.6 35.14 35.22   Foot/eye exam completion dates Latest Ref Rng & Units 02/13/2017 04/03/2016  Eye Exam No Retinopathy - No Retinopathy  Foot Form Completion - Done -        Hyperlipidemia LDL goal <100 Hyperlipidemia:Low  fat diet discussed and encouraged.   Lipid Panel  Lab Results  Component Value Date   CHOL 170 06/06/2016   HDL 67 06/06/2016   LDLCALC 89 06/06/2016   TRIG 69 06/06/2016   CHOLHDL 2.5 06/06/2016   Updated lab needed at/ before next visit.     Obesity, diabetes, and hypertension syndrome Unchanged. Patient re-educated about  the importance of commitment to a  minimum of 150 minutes of exercise per week.  The importance of healthy food choices with portion control discussed. Encouraged to start a food diary, count calories and to consider  joining a support group. Sample diet sheets offered. Goals set by the patient for the next several months.   Weight /BMI 05/21/2017 04/16/2017 03/11/2017  WEIGHT 184 lb 185 lb 3.2 oz 186 lb 10.1 oz  HEIGHT 5\' 1"  5\' 1"  5\' 1"   BMI 34.77 kg/m2 34.99 kg/m2 35.26 kg/m2      GAD (generalized anxiety disorder) Increased anxiety and uncontrolled , no interest in therapy at this time as related to problems with her son  Dermatitis Topical steroid to be applied twice daily,for 1 week,  then as needed  ]

## 2017-05-27 NOTE — Assessment & Plan Note (Signed)
Ms. Shrestha is reminded of the importance of commitment to daily physical activity for 30 minutes or more, as able and the need to limit carbohydrate intake to 30 to 60 grams per meal to help with blood sugar control.   The need to take medication as prescribed, test blood sugar as directed, and to call between visits if there is a concern that blood sugar is uncontrolled is also discussed.   Ms. Arena is reminded of the importance of daily foot exam, annual eye examination, and good blood sugar, blood pressure and cholesterol control. Improved  Diabetic Labs Latest Ref Rng & Units 05/20/2017 02/11/2017 12/19/2016 10/10/2016 06/19/2016  HbA1c <5.7 % of total Hgb 7.6(H) 7.9(H) - 8.1(H) -  Microalbumin mg/dL - 1.2 - - -  Micro/Creat Ratio <30 mcg/mg creat - 8 - - -  Chol <200 mg/dL - - - - -  HDL >50 mg/dL - - - - -  Calc LDL <100 mg/dL - - - - -  Triglycerides <150 mg/dL - - - - -  Creatinine 0.50 - 0.99 mg/dL 1.06(H) 0.99 1.13(H) 0.77 0.90   BP/Weight 05/21/2017 04/16/2017 03/11/2017 02/13/2017 12/19/2016 05/16/1222 06/18/7528  Systolic BP 051 102 111 735 670 141 030  Diastolic BP 84 80 72 80 75 82 73  Wt. (Lbs) 184 185.2 186.63 183 183.1 186 186.4  BMI 34.77 34.99 35.26 34.58 34.6 35.14 35.22   Foot/eye exam completion dates Latest Ref Rng & Units 02/13/2017 04/03/2016  Eye Exam No Retinopathy - No Retinopathy  Foot Form Completion - Done -

## 2017-05-27 NOTE — Assessment & Plan Note (Signed)
Topical steroid to be applied twice daily,for 1 week,  then as needed

## 2017-05-27 NOTE — Assessment & Plan Note (Signed)
Unchanged. Patient re-educated about  the importance of commitment to a  minimum of 150 minutes of exercise per week.  The importance of healthy food choices with portion control discussed. Encouraged to start a food diary, count calories and to consider  joining a support group. Sample diet sheets offered. Goals set by the patient for the next several months.   Weight /BMI 05/21/2017 04/16/2017 03/11/2017  WEIGHT 184 lb 185 lb 3.2 oz 186 lb 10.1 oz  HEIGHT 5\' 1"  5\' 1"  5\' 1"   BMI 34.77 kg/m2 34.99 kg/m2 35.26 kg/m2

## 2017-05-27 NOTE — Assessment & Plan Note (Signed)
Increased anxiety and uncontrolled , no interest in therapy at this time as related to problems with her son

## 2017-06-07 ENCOUNTER — Ambulatory Visit: Payer: Medicare Other | Admitting: Gastroenterology

## 2017-06-07 ENCOUNTER — Encounter: Payer: Self-pay | Admitting: Gastroenterology

## 2017-06-07 ENCOUNTER — Encounter: Payer: Self-pay | Admitting: Internal Medicine

## 2017-06-07 VITALS — BP 174/78 | HR 64 | Temp 97.5°F | Ht 61.0 in | Wt 187.0 lb

## 2017-06-07 DIAGNOSIS — K219 Gastro-esophageal reflux disease without esophagitis: Secondary | ICD-10-CM

## 2017-06-07 NOTE — Patient Instructions (Signed)
Continue Protonix 30 minutes before breakfast and dinner. If possible, you can wean down to just once per day. 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   We will see you in 6 months or sooner if needed.   It was a pleasure to see you today. I strive to create trusting relationships with patients to provide genuine, compassionate, and quality care. I value your feedback. If you receive a survey regarding your visit,  I greatly appreciate you taking time to fill this out.   Annitta Needs, PhD, ANP-BC [facility]

## 2017-06-07 NOTE — Assessment & Plan Note (Signed)
62 year old female with GERD, epigastric pain a few months ago but now resolved. No alarm features. On once to BID dosing PPI, but she often forgets to take. Discussed appropriate timing of this and weaning down to lowest effective dosing regimen. Call if any recurrence of pain. Return in 6 months.

## 2017-06-07 NOTE — Progress Notes (Signed)
Primary Care Physician:  [name], MD  Primary GI: Dr. [name]   Chief Complaint  Patient presents with  . Gastroesophageal Reflux    HPI:   Michaela Peterson is a 62 y.o. female presenting today with a history of low normal ferritin, low iron, Hgb 12 range historically. Colonoscopy/EGD in July 2015 and capsule unremarkable June 2016. Constipation, GERD chronic. History of gastroparesis. Last seen in Feb 2019 and noted LUQ/epigastric discomfort intermittently. PPI was increased to BID, GERD diet discussed. Possible need for EGD discussed. Here for close interval follow-up.   Doing well today. No abdominal pain, N/V. No issues with constipation. No rectal bleeding. Reports that she has significant stress regarding her son. Otherwise, no GI concerns. Forgets to take her medication a lot, stating she gets busy.     Past Medical History:  Diagnosis Date  . Adnexal mass    Left; followed by Dr. [name]  . Anemia, iron deficiency   . Arthritis   . Chronic low back pain   . Diabetes mellitus, type 2 ([place])   . Essential hypertension   . Gastroparesis   . GERD (gastroesophageal reflux disease)   . MS (multiple sclerosis) ([place])   . Nonalcoholic fatty liver disease   . Obesity   . Sleep apnea 2009    Past Surgical History:  Procedure Laterality Date  . AGILE CAPSULE N/A 08/11/2014   Procedure: AGILE CAPSULE;  Surgeon: Daneil Dolin, MD;  Location: [facility];  Service: Endoscopy;  Laterality: N/A;  0700  . BLADDER SUSPENSION    . CESAREAN SECTION     x2   . CHOLECYSTECTOMY    . COLONOSCOPY  2010   Dr. [name]: tubular adenoma, few scattered diverticula  . COLONOSCOPY N/A 10/01/2013   Dr. Pixie Casino preparation. Normal rectum. Normal colonic mucosa  . DILATION AND CURETTAGE OF UTERUS     4  . ESOPHAGOGASTRODUODENOSCOPY  2010   Dr. [name]: normal esophagus, small hiatal hernia, questionable pale duodenal mucosa but negative for celiac sprue.   .  ESOPHAGOGASTRODUODENOSCOPY N/A 10/01/2013   Dr. Rourk:gastric polyps-status post biopsy. Otherwise, normal EGD. fundic gland polyp, negative H.pylori  . GIVENS CAPSULE STUDY N/A 09/08/2014   Poor prep but overall unremarkable   . KNEE ARTHROSCOPY W/ DEBRIDEMENT    . LAPAROSCOPY ABDOMEN DIAGNOSTIC     lysis of adhesions  . ROTATOR CUFF REPAIR    . TENDON LENGTHENING     Left wrist  . UMBILICAL HERNIA REPAIR      Current Outpatient Medications  Medication Sig Dispense Refill  . allopurinol (ZYLOPRIM) 100 MG tablet TAKE 1 TABLET BY MOUTH  DAILY 90 tablet 1  . azelastine (ASTELIN) 0.1 % nasal spray Place 2 sprays into both nostrils 2 (two) times daily. Use in each nostril as directed 30 mL 12  . blood glucose meter kit and supplies Dispense based on patient and insurance preference. Use up to four times daily as directed. (FOR ICD-9 250.00, 250.01). 1 each 0  . cetirizine (ZYRTEC) 10 MG tablet Take 1 tablet (10 mg total) by mouth daily. 30 tablet 0  . clobetasol (TEMOVATE) 0.05 % external solution APPLY TO AFFECTED AREA ON SCALP TWICE A DAY (NOT TO FACE, GROIN OR UNDERARMS), Uses as needed  2  . docusate sodium (COLACE) 50 MG capsule Take 50 mg by mouth daily as needed for mild constipation.     Marland Kitchen glipiZIDE (GLUCOTROL) 10 MG tablet TAKE 1 TABLET BY MOUTH 2  TIMES  DAILY BEFORE MEALS 180 tablet 3  . glucose blood (ONE TOUCH ULTRA TEST) test strip USE UP TO 4 TIMES DAILY AS  DIRECTED 400 each 1  . metFORMIN (GLUCOPHAGE) 1000 MG tablet TAKE 1 TABLET BY MOUTH TWO  TIMES DAILY WITH MEALS 180 tablet 1  . montelukast (SINGULAIR) 10 MG tablet TAKE 1 TABLET (10 MG TOTAL) BY MOUTH AT BEDTIME. 90 tablet 1  . mupirocin ointment (BACTROBAN) 2 % Apply 1 application topically 2 (two) times daily. (Patient taking differently: Apply 1 application topically as needed. ) 30 g 1  . NIFEdipine (ADALAT CC) 90 MG 24 hr tablet TAKE 1 TABLET BY MOUTH  DAILY 90 tablet 3  . ONETOUCH DELICA LANCETS 49S MISC USE UP TO 4  TIMES DAILY AS  DIRECTED 400 each 1  . pantoprazole (PROTONIX) 40 MG tablet Take 1 tablet (40 mg total) by mouth 2 (two) times daily before a meal. 180 tablet 3  . terbinafine (LAMISIL) 1 % cream Apply twice daily to rash for 1 week , then as needed 30 g 0  . triamcinolone (NASACORT AQ) 55 MCG/ACT AERO nasal inhaler Place 2 sprays into the nose daily. (Patient taking differently: Place 2 sprays into the nose as needed. ) 1 Inhaler 0  . triamterene-hydrochlorothiazide (MAXZIDE-25) 37.5-25 MG tablet TAKE 1 TABLET BY MOUTH  DAILY 90 tablet 1   No current facility-administered medications for this visit.     Allergies as of 06/07/2017 - Review Complete 06/07/2017  Allergen Reaction Noted  . Gabapentin Shortness Of Breath 02/13/2017  . Ace inhibitors Cough 03/26/2012  . Aspirin    . Oxycodone-acetaminophen Hives 02/21/2007  . Penicillins Hives   . Statins  10/11/2012    Family History  Problem Relation Age of Onset  . Hypertension Sister   . Diabetes Sister   . Heart disease Sister   . Kidney failure Sister   . Heart failure Mother   . Hypertension Mother   . Arthritis Mother   . Stroke Father   . Hypertension Son   . Hypertension Sister   . Hypertension Brother   . Mental illness Brother   . Colon cancer Maternal Aunt     Social History   Socioeconomic History  . Marital status: Married    Spouse name: [name]   . Number of children: 4  . Years of education: 7  . Highest education level: Not on file  Occupational History  . Occupation: unemployed     Fish farm manager: UNEMPLOYED  Social Needs  . Financial resource strain: Not on file  . Food insecurity:    Worry: Not on file    Inability: Not on file  . Transportation needs:    Medical: Not on file    Non-medical: Not on file  Tobacco Use  . Smoking status: Never Smoker  . Smokeless tobacco: Never Used  . Tobacco comment: patient lives with a smoker   Substance and Sexual Activity  . Alcohol use: No  . Drug use: No  .  Sexual activity: Yes  Lifestyle  . Physical activity:    Days per week: Not on file    Minutes per session: Not on file  . Stress: Not on file  Relationships  . Social connections:    Talks on phone: Not on file    Gets together: Not on file    Attends religious service: Not on file    Active member of club or organization: Not on file    Attends meetings of  clubs or organizations: Not on file    Relationship status: Not on file  Other Topics Concern  . Not on file  Social History Narrative   Patient lives at home with husband [name].    Patient has 4 children.    Patient has a some college.    Patient is right handed.    Patient is currently not working.     Review of Systems: Gen: Denies fever, chills, anorexia. Denies fatigue, weakness, weight loss.  CV: Denies chest pain, palpitations, syncope, peripheral edema, and claudication. Resp: Denies dyspnea at rest, cough, wheezing, coughing up blood, and pleurisy. GI: see HPI  Derm: Denies rash, itching, dry skin Psych: Denies depression, anxiety, memory loss, confusion. No homicidal or suicidal ideation.  Heme: Denies bruising, bleeding, and enlarged lymph nodes.  Physical Exam: BP (!) 174/78   Pulse 64   Temp (!) 97.5 F (36.4 C) (Oral)   Ht 5' 1"  (1.549 m)   Wt 187 lb (84.8 kg)   LMP 08/18/2012   BMI 35.33 kg/m  General:   Alert and oriented. No distress noted. Pleasant and cooperative.  Head:  Normocephalic and atraumatic. Eyes:  Conjuctiva clear without scleral icterus. Mouth:  Oral mucosa pink and moist.  Abdomen:  +BS, soft, non-tender and non-distended. No rebound or guarding. No HSM or masses noted. Msk:  Symmetrical without gross deformities. Normal posture. Extremities:  Without edema. Neurologic:  Alert and  oriented x4 Psych:  Alert and cooperative. Normal mood and affect.

## 2017-06-07 NOTE — Progress Notes (Signed)
CC'D TO PCP °

## 2017-06-13 ENCOUNTER — Encounter: Payer: Self-pay | Admitting: Internal Medicine

## 2017-06-13 ENCOUNTER — Ambulatory Visit: Payer: Medicare Other | Admitting: Internal Medicine

## 2017-06-13 ENCOUNTER — Telehealth: Payer: Self-pay | Admitting: Internal Medicine

## 2017-06-13 VITALS — BP 130/78 | HR 70 | Ht 61.0 in | Wt 186.2 lb

## 2017-06-13 DIAGNOSIS — G4733 Obstructive sleep apnea (adult) (pediatric): Secondary | ICD-10-CM | POA: Diagnosis not present

## 2017-06-13 MED ORDER — ZALEPLON 5 MG PO CAPS: 5.0000 mg | ORAL_CAPSULE | Freq: Every evening | ORAL | 5 refills | Status: DC | PRN

## 2017-06-13 NOTE — Telephone Encounter (Signed)
Ok to try Zzquill. Please take sonata off her med list

## 2017-06-13 NOTE — Progress Notes (Signed)
HPI female never smoker followed for OSA/ insomnia, complicated by HBP, GERD, DM 2/gastroparesis, Multiple Sclerosis Home Sleep Test- 08/27/16-AHI 25.4/hour, desaturation to 80%, body weight 184.6 pounds ----------------------------------------------------------------------------------------  03/11/17-62 year old female never smoker followed for OSA OSA (obstructive sleep apnea) Previous note by Dr DeDios:  Pt was diagnosed with OSA in 1990s. She was symptomatic with snoring, gasping, witnessed apneas, unrefreshed sleep. She had a lab study which showed mild OSA and was started on cpap. She could not tolerate the CPAP machine secondary to pressure. She would end up removing her mask in the middle of the night. She stopped using her CPAP therapy after several months.  She continues to be symptomatic. Has snoring, unrefreshed sleep. Occasional witnessed apneas, frequent awakenings.  She naps in pm.  No abnormal behavior and sleep. ESS 15.  Home Sleep Test- 08/27/16-AHI 25.4/hour, desaturation to 80%, body weight 184.6 pounds CPAP titration- 09/26/16 - 8  CWP Work in- OSA; Former AD patient. Per [facility] patient needs face to face for CPAP set up.  Medical problem list includes HBP, GERD, DM 2/gastroparesis, Multiple Sclerosis This office visit was required after recent sleep study, in order to initiate CPAP.  Contact has been made with [facility].  We discussed comfort measures and goals.  Starting CPAP auto 5-12  06/13/17- 62 year old female never smoker followed for OSA/ insomnia, complicated by HBP, GERD, DM 2/gastroparesis, Multiple Sclerosis CPAP auto 5-15/ [facility] Has been trying to use CPAP and download shows 100% compliance AHI 3.1/hour.  However she indicates she is quite uncomfortable with her nasal mask.  Has upper denture so she will not be a candidate for an oral appliance.  Using Benadryl as a sleep aid but it carries over too long.  ROS-see HPI   + = positive Constitutional:    weight loss,  night sweats, fevers, chills, + fatigue, lassitude. HEENT:    headaches, difficulty swallowing, tooth/dental problems, sore throat,       sneezing, itching, ear ache, nasal congestion, post nasal drip, snoring CV:    chest pain, orthopnea, PND, swelling in lower extremities, anasarca,                                  dizziness, palpitations Resp:   shortness of breath with exertion or at rest.                productive cough,   non-productive cough, coughing up of blood.              change in color of mucus.  wheezing.   Skin:    rash or lesions. GI:  No-   heartburn, indigestion, abdominal pain, nausea, vomiting, diarrhea,                 change in bowel habits, loss of appetite GU: dysuria, change in color of urine, no urgency or frequency.   flank pain. MS:   joint pain, stiffness, decreased range of motion, back pain. Neuro-     nothing unusual Psych:  change in mood or affect.  depression or anxiety.   memory loss.  OBJ- Physical Exam General- Alert, Oriented, Affect-appropriate, Distress- none acute, + overweight Skin- rash-none, lesions- none, excoriation- none Lymphadenopathy- none Head- atraumatic            Eyes- Gross vision intact, PERRLA, conjunctivae and secretions clear            Ears- Hearing, canals-normal  Nose- Clear, no-Septal dev, mucus, polyps, erosion, perforation             Throat- Mallampati IV , mucosa clear , drainage- none, tonsils- atrophic Neck- flexible , trachea midline, no stridor , thyroid nl, carotid no bruit Chest - symmetrical excursion , unlabored           Heart/CV- RRR , no murmur , no gallop  , no rub, nl s1 s2                           - JVD- none , edema- none, stasis changes- none, varices- none           Lung- clear to P&A, wheeze- none, cough- none , dullness-none, rub- none           Chest wall-  Abd-  Br/ Gen/ Rectal- Not done, not indicated Extrem- cyanosis- none, clubbing, none, atrophy- none, strength- nl Neuro- grossly  intact to observation

## 2017-06-13 NOTE — Telephone Encounter (Signed)
Called and spoke to pt.  Pt states that Read Drivers is not affordable for her. Pt wanted to make CY aware that she is doing to try ZzzQuil verses the Sonata.  CY please advise. Thanks  Current Outpatient Medications on File Prior to Visit  Medication Sig Dispense Refill  . allopurinol (ZYLOPRIM) 100 MG tablet TAKE 1 TABLET BY MOUTH  DAILY 90 tablet 1  . azelastine (ASTELIN) 0.1 % nasal spray Place 2 sprays into both nostrils 2 (two) times daily. Use in each nostril as directed 30 mL 12  . blood glucose meter kit and supplies Dispense based on patient and insurance preference. Use up to four times daily as directed. (FOR ICD-9 250.00, 250.01). 1 each 0  . cetirizine (ZYRTEC) 10 MG tablet Take 1 tablet (10 mg total) by mouth daily. 30 tablet 0  . clobetasol (TEMOVATE) 0.05 % external solution APPLY TO AFFECTED AREA ON SCALP TWICE A DAY (NOT TO FACE, GROIN OR UNDERARMS), Uses as needed  2  . docusate sodium (COLACE) 50 MG capsule Take 50 mg by mouth daily as needed for mild constipation.     Marland Kitchen glipiZIDE (GLUCOTROL) 10 MG tablet TAKE 1 TABLET BY MOUTH 2  TIMES DAILY BEFORE MEALS 180 tablet 3  . glucose blood (ONE TOUCH ULTRA TEST) test strip USE UP TO 4 TIMES DAILY AS  DIRECTED 400 each 1  . metFORMIN (GLUCOPHAGE) 1000 MG tablet TAKE 1 TABLET BY MOUTH TWO  TIMES DAILY WITH MEALS 180 tablet 1  . montelukast (SINGULAIR) 10 MG tablet TAKE 1 TABLET (10 MG TOTAL) BY MOUTH AT BEDTIME. 90 tablet 1  . mupirocin ointment (BACTROBAN) 2 % Apply 1 application topically 2 (two) times daily. (Patient taking differently: Apply 1 application topically as needed. ) 30 g 1  . NIFEdipine (ADALAT CC) 90 MG 24 hr tablet TAKE 1 TABLET BY MOUTH  DAILY 90 tablet 3  . ONETOUCH DELICA LANCETS 78G MISC USE UP TO 4 TIMES DAILY AS  DIRECTED 400 each 1  . pantoprazole (PROTONIX) 40 MG tablet Take 1 tablet (40 mg total) by mouth 2 (two) times daily before a meal. 180 tablet 3  . terbinafine (LAMISIL) 1 % cream Apply twice daily to  rash for 1 week , then as needed 30 g 0  . triamcinolone (NASACORT AQ) 55 MCG/ACT AERO nasal inhaler Place 2 sprays into the nose daily. (Patient taking differently: Place 2 sprays into the nose as needed. ) 1 Inhaler 0  . triamterene-hydrochlorothiazide (MAXZIDE-25) 37.5-25 MG tablet TAKE 1 TABLET BY MOUTH  DAILY 90 tablet 1  . zaleplon (SONATA) 5 MG capsule Take 1 capsule (5 mg total) by mouth at bedtime as needed for sleep. 30 capsule 5   No current facility-administered medications on file prior to visit.     Allergies  Allergen Reactions  . Gabapentin Shortness Of Breath  . Ace Inhibitors Cough  . Aspirin     REACTION: unknown reaction  . Oxycodone-Acetaminophen Hives  . Penicillins Hives  . Statins     NAFLD

## 2017-06-13 NOTE — Patient Instructions (Addendum)
Order- DME [facility]- please refit/ restyle mask for comfort. Consider nasal pillows. Reduce CPAP auto range to 5-12. Continue mask of choice, humidifier, supplies, AirView.   Script printed for Sonata/ zaleplon     Try 1 or 2  at bedtime if needed for sleep, instead of benadryl  Feel free to call back if we can help

## 2017-06-13 NOTE — Telephone Encounter (Signed)
Pt is aware of below message and voiced his understanding.  Read Drivers has been removed from med list. Nothing further is needed.

## 2017-06-18 NOTE — Assessment & Plan Note (Signed)
Download confirms good compliance and control.  She is motivated to make this work and seems to be sleeping better with CPAP but is having significant discomfort from mask. Plan-refit mask.  Continue CPAP auto 5-15, okay to try Sonata instead of Benadryl for sleep aid if needed while she gets used to CPAP

## 2017-06-25 DIAGNOSIS — G4733 Obstructive sleep apnea (adult) (pediatric): Secondary | ICD-10-CM | POA: Diagnosis not present

## 2017-07-01 ENCOUNTER — Telehealth: Payer: Self-pay | Admitting: Family Medicine

## 2017-07-01 NOTE — Telephone Encounter (Signed)
PT lvm that she needed something stronger for the Gout, please advise

## 2017-07-02 ENCOUNTER — Other Ambulatory Visit: Payer: Self-pay | Admitting: Family Medicine

## 2017-07-02 MED ORDER — INDOMETHACIN 25 MG PO CAPS: ORAL_CAPSULE | ORAL | 0 refills | Status: DC

## 2017-07-02 MED ORDER — PREDNISONE 5 MG PO TABS: ORAL_TABLET | ORAL | 0 refills | Status: DC

## 2017-07-02 NOTE — Telephone Encounter (Signed)
Medications are prescribed and she is aware, indocid and prednismne and zantac will be if not on a pPI

## 2017-07-02 NOTE — Telephone Encounter (Signed)
Is having a flare of her left great toe- swelling and very painful to the touch. Has been taking the allopurinol but is requesting something for a flare. Please advise

## 2017-07-25 DIAGNOSIS — G4733 Obstructive sleep apnea (adult) (pediatric): Secondary | ICD-10-CM | POA: Diagnosis not present

## 2017-07-30 ENCOUNTER — Encounter (HOSPITAL_COMMUNITY): Payer: Self-pay | Admitting: Emergency Medicine

## 2017-07-30 ENCOUNTER — Other Ambulatory Visit: Payer: Self-pay

## 2017-07-30 ENCOUNTER — Emergency Department (HOSPITAL_COMMUNITY)
Admission: EM | Admit: 2017-07-30 | Discharge: 2017-07-31 | Disposition: A | Discharge status: Home / Self Care | Payer: Medicare Other | Attending: Emergency Medicine | Admitting: Emergency Medicine

## 2017-07-30 DIAGNOSIS — M5416 Radiculopathy, lumbar region: Secondary | ICD-10-CM | POA: Diagnosis not present

## 2017-07-30 DIAGNOSIS — E119 Type 2 diabetes mellitus without complications: Secondary | ICD-10-CM | POA: Insufficient documentation

## 2017-07-30 DIAGNOSIS — M79651 Pain in right thigh: Secondary | ICD-10-CM

## 2017-07-30 DIAGNOSIS — I10 Essential (primary) hypertension: Secondary | ICD-10-CM | POA: Insufficient documentation

## 2017-07-30 DIAGNOSIS — Z7982 Long term (current) use of aspirin: Secondary | ICD-10-CM | POA: Insufficient documentation

## 2017-07-30 DIAGNOSIS — Z7984 Long term (current) use of oral hypoglycemic drugs: Secondary | ICD-10-CM | POA: Insufficient documentation

## 2017-07-30 MED ORDER — HYDROCODONE-ACETAMINOPHEN 5-325 MG PO TABS
1.0000 | ORAL_TABLET | Freq: Once | ORAL | Status: AC
  Administered 2017-07-30: 1 via ORAL
  Filled 2017-07-30: qty 1

## 2017-07-30 MED ORDER — CYCLOBENZAPRINE HCL 10 MG PO TABS
10.0000 mg | ORAL_TABLET | Freq: Once | ORAL | Status: AC
  Administered 2017-07-30: 10 mg via ORAL
  Filled 2017-07-30: qty 1

## 2017-07-30 NOTE — ED Triage Notes (Signed)
Pt reports hx of restless leg   She states that she had restless leg to her R leg Sunday and now feels that there is a band around her upper leg  She has not contacted her PCP  She has a hx of MS

## 2017-07-30 NOTE — ED Provider Notes (Addendum)
[facility] EMERGENCY DEPARTMENT Provider Note   CSN: [id number] Arrival date & time: 07/30/17  2142     History   Chief Complaint Chief Complaint  Patient presents with  . Leg Pain    HPI Michaela Peterson is a 62 y.o. female.  The history is provided by the patient.  Leg Pain    She has a history of hypertension, multiple sclerosis, diabetes, chronic back pain and comes in with right thigh pain for the last 2 days.  She had been on her feet more than normal for about 24 hours before she started having pain.  It started like a band across her mid thigh, but now is only in the anterior aspect of the thigh.  There is radiation of pain up towards her sacral area and down toward her knee.  She is unable to characterize the pain.  It is worse when she stands.  Nothing makes it better.  She has been taking ibuprofen without relief.  She denies weakness, numbness, tingling.  She denies bowel or bladder dysfunction.  She has been told that she has a cyst on her spine and that the neurosurgeon would operate on her when it became symptomatic.  Pain is currently rated at 8/10.  She has had similar symptoms in the past, but they were on the left side.  Past Medical History:  Diagnosis Date  . Adnexal mass    Left; followed by Dr. [name]  . Anemia, iron deficiency   . Arthritis   . Chronic low back pain   . Diabetes mellitus, type 2 ([place])   . Essential hypertension   . Gastroparesis   . GERD (gastroesophageal reflux disease)   . MS (multiple sclerosis) ([place])   . Nonalcoholic fatty liver disease   . Obesity   . Sleep apnea 2009    Patient Active Problem List   Diagnosis Date Noted  . GAD (generalized anxiety disorder) 05/27/2017  . Dermatitis 05/27/2017  . Periodic limb movements of sleep 10/08/2016  . At risk for cardiovascular event 06/17/2016  . Diabetes mellitus type 2 in obese ([place]) 02/29/2016  . Constipation 02/09/2015  . Abnormal CT scan, chest 10/19/2013  . Dyspepsia  09/17/2013  . Tubular adenoma 09/17/2013  . Hyperlipidemia LDL goal <100 02/02/2011  . Pulmonary hypertension ([place]) 06/26/2010  . BACK PAIN WITH RADICULOPATHY 08/01/2009  . Nonalcoholic fatty liver disease 07/13/2009  . Obesity, diabetes, and hypertension syndrome ([place]) 03/12/2009  . GERD 11/01/2008  . GASTROPARESIS 11/01/2008  . MULTIPLE SCLEROSIS 06/16/2007  . OSA (obstructive sleep apnea) 05/16/2007  . Hypertension goal BP (blood pressure) < 140/90 04/20/2007  . Allergic rhinitis 04/18/2007    Past Surgical History:  Procedure Laterality Date  . AGILE CAPSULE N/A 08/11/2014   Procedure: AGILE CAPSULE;  Surgeon: Daneil Dolin, MD;  Location: [facility] ENDO SUITE;  Service: Endoscopy;  Laterality: N/A;  0700  . BLADDER SUSPENSION    . CESAREAN SECTION     x2   . CHOLECYSTECTOMY    . COLONOSCOPY  2010   Dr. [name]: tubular adenoma, few scattered diverticula  . COLONOSCOPY N/A 10/01/2013   Dr. Pixie Casino preparation. Normal rectum. Normal colonic mucosa  . DILATION AND CURETTAGE OF UTERUS     4  . ESOPHAGOGASTRODUODENOSCOPY  2010   Dr. [name]: normal esophagus, small hiatal hernia, questionable pale duodenal mucosa but negative for celiac sprue.   . ESOPHAGOGASTRODUODENOSCOPY N/A 10/01/2013   Dr. Rourk:gastric polyps-status post biopsy. Otherwise, normal EGD. fundic gland polyp, negative  H.pylori  . GIVENS CAPSULE STUDY N/A 09/08/2014   Poor prep but overall unremarkable   . KNEE ARTHROSCOPY W/ DEBRIDEMENT    . LAPAROSCOPY ABDOMEN DIAGNOSTIC     lysis of adhesions  . ROTATOR CUFF REPAIR    . TENDON LENGTHENING     Left wrist  . UMBILICAL HERNIA REPAIR       OB History   None      Home Medications    Prior to Admission medications   Medication Sig Start Date End Date Taking? Authorizing Provider  allopurinol (ZYLOPRIM) 100 MG tablet TAKE 1 TABLET BY MOUTH  DAILY 04/26/17  Yes [name], MD  aspirin EC 81 MG tablet Take 81 mg by mouth daily.   Yes [provider]  azelastine (ASTELIN) 0.1 % nasal spray Place 2 sprays into both nostrils 2 (two) times daily. Use in each nostril as directed 04/03/17  Yes [name], MD  cetirizine (ZYRTEC) 10 MG tablet Take 1 tablet (10 mg total) by mouth daily. 05/11/16  Yes Raylene Everts, MD  diphenhydrAMINE (BENADRYL) 25 MG tablet Take 25 mg by mouth every 6 (six) hours as needed for itching.   Yes [provider]  glipiZIDE (GLUCOTROL) 10 MG tablet TAKE 1 TABLET BY MOUTH 2  TIMES DAILY BEFORE MEALS 11/27/16  Yes [name], MD  ibuprofen (ADVIL,MOTRIN) 200 MG tablet Take 400 mg by mouth every 6 (six) hours as needed for mild pain or moderate pain.   Yes [provider]  metFORMIN (GLUCOPHAGE) 1000 MG tablet TAKE 1 TABLET BY MOUTH TWO  TIMES DAILY WITH MEALS 02/13/17  Yes [name], MD  montelukast (SINGULAIR) 10 MG tablet TAKE 1 TABLET (10 MG TOTAL) BY MOUTH AT BEDTIME. 01/01/17  Yes [name], MD  NIFEdipine (ADALAT CC) 90 MG 24 hr tablet TAKE 1 TABLET BY MOUTH  DAILY 11/27/16  Yes [name], MD  pantoprazole (PROTONIX) 40 MG tablet Take 1 tablet (40 mg total) by mouth 2 (two) times daily before a meal. 04/16/17  Yes Annitta Needs, NP  triamcinolone (NASACORT AQ) 55 MCG/ACT AERO nasal inhaler Place 2 sprays into the nose daily. Patient taking differently: Place 2 sprays into the nose as needed.  05/11/16  Yes Raylene Everts, MD  triamterene-hydrochlorothiazide [facility]) 37.5-25 MG tablet TAKE 1 TABLET BY MOUTH  DAILY 04/26/17  Yes [name], MD  blood glucose meter kit and supplies Dispense based on patient and insurance preference. Use up to four times daily as directed. (FOR ICD-9 250.00, 250.01). 01/01/17   [name], MD  glucose blood (ONE TOUCH ULTRA TEST) test strip USE UP TO 4 TIMES DAILY AS  DIRECTED 05/02/17   [name], MD  indomethacin (INDOCIN) 25 MG capsule One capsule 3 times daily for 1 days, then one  capsule two times daily for 2 days, then one capsule one daily for 2 days , then stop Patient not taking: Reported on 07/30/2017 07/02/17   [name], MD  [facility] DELICA LANCETS 17C MISC USE UP TO 4 TIMES DAILY AS  DIRECTED 05/02/17   [name], MD  terbinafine (LAMISIL) 1 % cream Apply twice daily to rash for 1 week , then as needed Patient not taking: Reported on 07/30/2017 05/21/17   [name], MD    Family History Family History  Problem Relation Age of Onset  . Hypertension Sister   . Diabetes Sister   .  Heart disease Sister   . Kidney failure Sister   . Heart failure Mother   . Hypertension Mother   . Arthritis Mother   . Stroke Father   . Hypertension Son   . Hypertension Sister   . Hypertension Brother   . Mental illness Brother   . Colon cancer Maternal Aunt     Social History Social History   Tobacco Use  . Smoking status: Never Smoker  . Smokeless tobacco: Never Used  . Tobacco comment: patient lives with a smoker   Substance Use Topics  . Alcohol use: No  . Drug use: No     Allergies   Gabapentin; Ace inhibitors; Aspirin; Oxycodone-acetaminophen; Penicillins; and Statins   Review of Systems Review of Systems  All other systems reviewed and are negative.    Physical Exam Updated Vital Signs BP (!) 124/55 (BP Location: Left Arm)   Pulse 64   Temp 97.8 F (36.6 C) (Oral)   Resp 18   Ht _0  (1.549 m)   Wt 92.5 kg (204 lb)   LMP 08/18/2012   SpO2 100%   BMI 38.55 kg/m   Physical Exam  Nursing note and vitals reviewed.  62 year old female, resting comfortably and in no acute distress. Vital signs are normal. Oxygen saturation is 100%, which is normal. Head is normocephalic and atraumatic. PERRLA, EOMI. Oropharynx is clear. Neck is nontender and supple without adenopathy or JVD. Back is mildly tender in the lower lumbar and right paralumbar area.  Straight leg raise is positive on the right at 45 degrees, and crossed  straight leg raise is positive on the left at 45 degrees.  There is no CVA tenderness. Lungs are clear without rales, wheezes, or rhonchi. Chest is nontender. Heart has regular rate and rhythm without murmur. Abdomen is soft, flat, nontender without masses or hepatosplenomegaly and peristalsis is normoactive. Extremities have no cyanosis or edema, full range of motion is present.  Moderate tenderness present in the right mid thigh without any point tenderness.  Normal strength of all muscles.  Strong distal pulses.  Capillary refill is prompt. Skin is warm and dry without rash. Neurologic: Mental status is normal, cranial nerves are intact, there are no motor or sensory deficits.  ED Treatments / Results   Procedures Procedures   Medications Ordered in ED Medications  cyclobenzaprine (FLEXERIL) tablet 10 mg (10 mg Oral Given 07/30/17 2320)  HYDROcodone-acetaminophen (NORCO/VICODIN) 5-325 MG per tablet 1 tablet (1 tablet Oral Given 07/30/17 2320)  HYDROcodone-acetaminophen (NORCO/VICODIN) 5-325 MG per tablet 1 tablet (1 tablet Oral Given 07/31/17 0005)     Initial Impression / Assessment and Plan / ED Course  I have reviewed the triage vital signs and the nursing notes.  Right thigh pain which appears to be radicular based on physical exam and history.  No indication for imaging today.  Review of old records shows MRI of lumbar spine in 2011 showing 14 mm left-sided synovial cyst.  At this point, I feel that symptomatic treatment would be most appropriate.  She is allergic to oxycodone-acetaminophen, but states that she is able to take hydrocodone.  She is given a dose of cyclobenzaprine and hydrocodone-acetaminophen.  She will be observed in the ED following administration to make sure she is not having any allergic reaction.  12:10 AM She has not shown any sign of allergy following taking hydrocodone-acetaminophen, but has not had significant pain relief.  She is given a second dose of  hydrocodone-acetaminophen and discharged with  prescription for same as well as prescription for cyclobenzaprine.  Follow-up with PCP as needed.  Return precautions discussed.  Final Clinical Impressions(s) / ED Diagnoses   Final diagnoses:  Pain in right thigh  Lumbar radiculopathy    ED Discharge Orders        Ordered    cyclobenzaprine (FLEXERIL) 10 MG tablet  3 times daily PRN     07/31/17 0008    HYDROcodone-acetaminophen (NORCO) 5-325 MG tablet  Every 4 hours PRN     03/70/96 4383       Delora Fuel, MD 81/84/03 7543    Delora Fuel, MD 60/67/70 0010

## 2017-07-31 MED ORDER — HYDROCODONE-ACETAMINOPHEN 5-325 MG PO TABS: 1.0000 | ORAL_TABLET | ORAL | 0 refills | Status: DC | PRN

## 2017-07-31 MED ORDER — CYCLOBENZAPRINE HCL 10 MG PO TABS: 10.0000 mg | ORAL_TABLET | Freq: Three times a day (TID) | ORAL | 0 refills | Status: DC | PRN

## 2017-07-31 MED ORDER — HYDROCODONE-ACETAMINOPHEN 5-325 MG PO TABS
1.0000 | ORAL_TABLET | Freq: Once | ORAL | Status: AC
  Administered 2017-07-31: 1 via ORAL
  Filled 2017-07-31: qty 1

## 2017-07-31 NOTE — Discharge Instructions (Signed)
Continue taking ibuprofen.

## 2017-07-31 NOTE — ED Notes (Signed)
Pt wheeled to waiting room. Pt verbalized understanding of discharge instructions.   

## 2017-08-01 ENCOUNTER — Telehealth: Payer: Self-pay | Admitting: Family Medicine

## 2017-08-01 NOTE — Telephone Encounter (Signed)
Pt is calling as she went to the ER the other day for pain in the hip, and they gave her a muscle relaxer, but its not helping the pain.. She states that the pain in from her hip around her back to her knee--please call the patient

## 2017-08-02 NOTE — Telephone Encounter (Signed)
Will need appt for eval

## 2017-08-06 ENCOUNTER — Other Ambulatory Visit: Payer: Self-pay | Admitting: Family Medicine

## 2017-08-06 DIAGNOSIS — Z1231 Encounter for screening mammogram for malignant neoplasm of breast: Secondary | ICD-10-CM

## 2017-08-08 ENCOUNTER — Encounter: Payer: Self-pay | Admitting: Family Medicine

## 2017-08-08 ENCOUNTER — Ambulatory Visit (INDEPENDENT_AMBULATORY_CARE_PROVIDER_SITE_OTHER): Payer: Medicare Other | Admitting: Family Medicine

## 2017-08-08 ENCOUNTER — Telehealth: Payer: Self-pay | Admitting: Family Medicine

## 2017-08-08 ENCOUNTER — Other Ambulatory Visit: Payer: Self-pay | Admitting: Family Medicine

## 2017-08-08 VITALS — BP 142/78 | Temp 78.0°F | Resp 16 | Ht 61.0 in | Wt 189.0 lb

## 2017-08-08 DIAGNOSIS — M5441 Lumbago with sciatica, right side: Secondary | ICD-10-CM | POA: Diagnosis not present

## 2017-08-08 DIAGNOSIS — Z09 Encounter for follow-up examination after completed treatment for conditions other than malignant neoplasm: Secondary | ICD-10-CM | POA: Diagnosis not present

## 2017-08-08 DIAGNOSIS — I10 Essential (primary) hypertension: Secondary | ICD-10-CM | POA: Diagnosis not present

## 2017-08-08 MED ORDER — CYCLOBENZAPRINE HCL 10 MG PO TABS: 10.0000 mg | ORAL_TABLET | Freq: Three times a day (TID) | ORAL | 0 refills | Status: DC | PRN

## 2017-08-08 MED ORDER — PREDNISONE 10 MG PO TABS: 10.0000 mg | ORAL_TABLET | Freq: Two times a day (BID) | ORAL | 0 refills | Status: DC

## 2017-08-08 MED ORDER — KETOROLAC TROMETHAMINE 60 MG/2ML IM SOLN
60.0000 mg | Freq: Once | INTRAMUSCULAR | Status: AC
Start: 2017-08-08 — End: 2017-08-08
  Administered 2017-08-08: 60 mg via INTRAMUSCULAR

## 2017-08-08 MED ORDER — IBUPROFEN 800 MG PO TABS: 800.0000 mg | ORAL_TABLET | Freq: Three times a day (TID) | ORAL | 0 refills | Status: DC | PRN

## 2017-08-08 NOTE — Telephone Encounter (Signed)
Medication sent and she is aware 

## 2017-08-08 NOTE — Patient Instructions (Addendum)
F/U as before, call if you need me sooner   Toradol 60 mg IM in office   Medications are sent in for right knee pian and swelling and also the right thigh spasm  Hope you feel better soon   Please work on good  health habits so that your health will improve. 1. Commitment to daily physical activity for 30 to 60  minutes, if you are able to do this.  2. Commitment to wise food choices. Aim for half of your  food intake to be vegetable and fruit, one quarter starchy foods, and one quarter protein. Try to eat on a regular schedule  3 meals per day, snacking between meals should be limited to vegetables or fruits or small portions of nuts. 64 ounces of water per day is generally recommended, unless you have specific health conditions, like heart failure or kidney failure where you will need to limit fluid intake.  3. Commitment to sufficient and a  good quality of physical and mental rest daily, generally between 6 to 8 hours per day.  WITH PERSISTANCE AND PERSEVERANCE, THE IMPOSSIBLE , BECOMES THE NORM! It is important that you exercise regularly at least 30 minutes 5 times a week. If you develop chest pain, have severe difficulty breathing, or feel very tired, stop exercising immediately and seek medical attention

## 2017-08-08 NOTE — Telephone Encounter (Signed)
Patient called in while you were at lunch to ask if she was also getting the muscle relaxer that she & Dr.Simpson discussed this morning in her appt. 336/ [phone]

## 2017-08-08 NOTE — Telephone Encounter (Signed)
Pt said she was supposed to be getting a muscle relaxer for her thigh spasm but I don't see where that was sent. Please advise

## 2017-08-13 ENCOUNTER — Ambulatory Visit: Payer: Medicare Other | Admitting: Nurse Practitioner

## 2017-08-13 ENCOUNTER — Ambulatory Visit: Payer: Medicare Other | Admitting: Family Medicine

## 2017-08-20 ENCOUNTER — Other Ambulatory Visit: Payer: Self-pay | Admitting: Family Medicine

## 2017-08-22 ENCOUNTER — Telehealth: Payer: Self-pay | Admitting: Internal Medicine

## 2017-08-22 NOTE — Telephone Encounter (Signed)
Sent message to [facility] to find out what the problem has been

## 2017-08-22 NOTE — Telephone Encounter (Signed)
Called [employer] and spoke to [place] and Barbaraann Rondo they have this order Barbaraann Rondo had spoke to her before she was not eligible in April she would have had to pay out of pocket Barbaraann Rondo told her it would have to be 07/15 or after for insurance to cover he has her on his list to call her first of July he is going to call her today and explain again.

## 2017-08-25 DIAGNOSIS — G4733 Obstructive sleep apnea (adult) (pediatric): Secondary | ICD-10-CM | POA: Diagnosis not present

## 2017-09-03 ENCOUNTER — Ambulatory Visit: Payer: Medicare Other | Admitting: Nurse Practitioner

## 2017-09-09 ENCOUNTER — Ambulatory Visit (HOSPITAL_COMMUNITY)
Admission: RE | Admit: 2017-09-09 | Discharge: 2017-09-09 | Disposition: A | Discharge status: Home / Self Care | Payer: Medicare Other | Source: Ambulatory Visit | Attending: Family Medicine | Admitting: Family Medicine

## 2017-09-09 DIAGNOSIS — Z1231 Encounter for screening mammogram for malignant neoplasm of breast: Secondary | ICD-10-CM | POA: Diagnosis not present

## 2017-09-15 ENCOUNTER — Encounter: Payer: Self-pay | Admitting: Family Medicine

## 2017-09-15 DIAGNOSIS — E669 Obesity, unspecified: Secondary | ICD-10-CM | POA: Insufficient documentation

## 2017-09-15 NOTE — Progress Notes (Signed)
   Michaela Peterson     MRN: [id number]      DOB: 1955/04/12   HPI Michaela Peterson is here for follow up of recent ED visit on 5/21 for right thigh pain radiating  From groin  , she has established lumbar stenosis from imaging in 2011,and her symptoms are obviously a flare , pain was initially a 10, now a 4 but still more than she would like, requests an injection for more relief Denies weakness, numbness or incontinence. No specific trigger. Does have lower extremity tingling Denies polyuria, polydipsia, blurred vision , or hypoglycemic episodes.  ROS Denies recent fever or chills. Denies sinus pressure, nasal congestion, ear pain or sore throat. Denies chest congestion, productive cough or wheezing. Denies chest pains, palpitations and leg swelling Denies abdominal pain, nausea, vomiting,diarrhea or constipation.   Denies dysuria, frequency, hesitancy or incontinence.  Denies depression, anxiety or insomnia. Denies skin break down or rash.   PE  BP (!) 142/78   Temp (!) 78 F (25.6 C)   Resp 16   Ht 5\' 1"  (1.549 m)   LMP 08/18/2012   SpO2 96%   BMI 38.55 kg/m   Patient alert and oriented and in no cardiopulmonary distress. Patient in pain HEENT: No facial asymmetry, EOMI,   oropharynx pink and moist.  Neck supple no JVD, no mass.  Chest: Clear to auscultation bilaterally.  CVS: S1, S2 no murmurs, no S3.Regular rate.  ABD: Soft non tender.   Ext: No edema  MS: decreased  ROM lumbar  spine, adequate in shoulders, hips and knees.  Skin: Intact, no ulcerations or rash noted.  Psych: Good eye contact, normal affect. Memory intact not anxious or depressed appearing.  CNS: CN 2-12 intact, power,  normal throughout.no focal deficits noted.   Assessment & Plan Encounter for examination following treatment at hospital Improvement in pain of lumbar radiculopath, however still persistent enough to warrant Im toradol which is administered. Patient educated re good back hygiene and  back strengthening exercises   Hypertension goal BP (blood pressure) < 140/90 Controlled, no change in medication   Acute back pain with sciatica, right Uncontrolled.Toradol lM administered IM in the office , improved since initial presentation in the ED 5 days prior  Morbid obesity ([place]) Deteriorated. Patient re-educated about  the importance of commitment to a  minimum of 150 minutes of exercise per week.  The importance of healthy food choices with portion control discussed. Encouraged to start a food diary, count calories and to consider  joining a support group. Sample diet sheets offered. Goals set by the patient for the next several months.   Weight /BMI 08/08/2017 07/30/2017 06/13/2017  WEIGHT - 204 lb 186 lb 3.2 oz  HEIGHT 5\' 1"  5\' 1"  5\' 1"   BMI 38.55 kg/m2 38.55 kg/m2 35.18 kg/m2

## 2017-09-15 NOTE — Assessment & Plan Note (Addendum)
Uncontrolled.Toradol lM administered IM in the office , improved since initial presentation in the ED 5 days prior

## 2017-09-15 NOTE — Assessment & Plan Note (Signed)
Controlled, no change in medication  

## 2017-09-15 NOTE — Assessment & Plan Note (Signed)
Improvement in pain of lumbar radiculopath, however still persistent enough to warrant Im toradol which is administered. Patient educated re good back hygiene and back strengthening exercises

## 2017-09-15 NOTE — Assessment & Plan Note (Signed)
Deteriorated. Patient re-educated about  the importance of commitment to a  minimum of 150 minutes of exercise per week.  The importance of healthy food choices with portion control discussed. Encouraged to start a food diary, count calories and to consider  joining a support group. Sample diet sheets offered. Goals set by the patient for the next several months.   Weight /BMI 08/08/2017 07/30/2017 06/13/2017  WEIGHT - 204 lb 186 lb 3.2 oz  HEIGHT 5\' 1"  5\' 1"  5\' 1"   BMI 38.55 kg/m2 38.55 kg/m2 35.18 kg/m2

## 2017-09-16 ENCOUNTER — Encounter: Payer: Self-pay | Admitting: Internal Medicine

## 2017-09-17 ENCOUNTER — Encounter: Payer: Self-pay | Admitting: Internal Medicine

## 2017-09-17 ENCOUNTER — Ambulatory Visit: Payer: Medicare Other | Admitting: Internal Medicine

## 2017-09-17 DIAGNOSIS — G4733 Obstructive sleep apnea (adult) (pediatric): Secondary | ICD-10-CM

## 2017-09-17 NOTE — Patient Instructions (Signed)
Order- DME [facility]- please continue CPAP auto 5-12, mask of choice, humidifier, supplies, AirView  Please call if we can help

## 2017-09-17 NOTE — Progress Notes (Signed)
HPI female never smoker followed for OSA/ insomnia, complicated by HBP, GERD, DM 2/gastroparesis, Multiple Sclerosis Home Sleep Test- 08/27/16-AHI 25.4/hour, desaturation to 80%, body weight 184.6 pounds ---------------------------------------------------------------------------------------- 06/13/17- 62 year old female never smoker followed for OSA/ insomnia, complicated by HBP, GERD, DM 2/gastroparesis, Multiple Sclerosis CPAP auto 5-15/ [facility] Has been trying to use CPAP and download shows 100% compliance AHI 3.1/hour.  However she indicates she is quite uncomfortable with her nasal mask.  Has upper denture so she will not be a candidate for an oral appliance.  Using Benadryl as a sleep aid but it carries over too long.  09/17/2017- 62 year old female never smoker followed for OSA/ insomnia, complicated by HBP, GERD, DM 2/gastroparesis, Multiple Sclerosis CPAP auto 5-12/ [facility] -----OSA: DME: [employer]. Pt wears CPAP nightly and DL attached.  Complaints of feeling hot in her nasal mask.  Does know how to adjust her humidifier. Download 93% compliance AHI 3.3/hour.  ROS-see HPI   + = positive Constitutional:    weight loss, night sweats, fevers, chills, + fatigue, lassitude. HEENT:    headaches, difficulty swallowing, tooth/dental problems, sore throat,       sneezing, itching, ear ache, nasal congestion, post nasal drip, snoring CV:    chest pain, orthopnea, PND, swelling in lower extremities, anasarca,                                  dizziness, palpitations Resp:   shortness of breath with exertion or at rest.                productive cough,   non-productive cough, coughing up of blood.              change in color of mucus.  wheezing.   Skin:    rash or lesions. GI:  No-   heartburn, indigestion, abdominal pain, nausea, vomiting, diarrhea,                 change in bowel habits, loss of appetite GU: dysuria, change in color of urine, no urgency or frequency.   flank pain. MS:   joint  pain, stiffness, decreased range of motion, back pain. Neuro-     nothing unusual Psych:  change in mood or affect.  depression or anxiety.   memory loss.  OBJ- Physical Exam General- Alert, Oriented, Affect-appropriate, Distress- none acute, + overweight Skin- rash-none, lesions- none, excoriation- none Lymphadenopathy- none Head- atraumatic            Eyes- Gross vision intact, PERRLA, conjunctivae and secretions clear            Ears- Hearing, canals-normal            Nose- Clear, no-Septal dev, mucus, polyps, erosion, perforation             Throat- Mallampati IV , mucosa clear , drainage- none, tonsils- atrophic Neck- flexible , trachea midline, no stridor , thyroid nl, carotid no bruit Chest - symmetrical excursion , unlabored           Heart/CV- RRR , no murmur , no gallop  , no rub, nl s1 s2                           - JVD- none , edema- none, stasis changes- none, varices- none           Lung- clear to P&A, wheeze- none, cough-  none , dullness-none, rub- none           Chest wall-  Abd-  Br/ Gen/ Rectal- Not done, not indicated Extrem- cyanosis- none, clubbing, none, atrophy- none, strength- nl Neuro- grossly intact to observation

## 2017-09-18 NOTE — Assessment & Plan Note (Signed)
Compliance and control are good and she does benefit with CPAP, sleeping better.  Complained about being too hot reflects current summer weather and we discussed keeping her room comfortable, perhaps adding a fan. Plan-continue CPAP auto 5-12

## 2017-09-18 NOTE — Assessment & Plan Note (Signed)
Importance of making an effort to lose some weight reviewed.

## 2017-09-23 ENCOUNTER — Other Ambulatory Visit: Payer: Self-pay | Admitting: Family Medicine

## 2017-09-24 DIAGNOSIS — G4733 Obstructive sleep apnea (adult) (pediatric): Secondary | ICD-10-CM | POA: Diagnosis not present

## 2017-10-16 DIAGNOSIS — E1169 Type 2 diabetes mellitus with other specified complication: Secondary | ICD-10-CM | POA: Diagnosis not present

## 2017-10-16 DIAGNOSIS — I10 Essential (primary) hypertension: Secondary | ICD-10-CM | POA: Diagnosis not present

## 2017-10-17 LAB — LIPID PANEL
Cholesterol: 179 mg/dL (ref <200)
HDL: 63 mg/dL (ref >50)
LDL Cholesterol (Calc): 99 mg/dL (calc)
Non-HDL Cholesterol (Calc): 116 mg/dL (calc) (ref <130)
Total CHOL/HDL Ratio: 2.8 (calc) (ref <5.0)
Triglycerides: 78 mg/dL (ref <150)

## 2017-10-17 LAB — HEMOGLOBIN A1C
Hgb A1c MFr Bld: 9.8 % of total Hgb — ABNORMAL HIGH (ref <5.7)
Mean Plasma Glucose: 235 (calc)
eAG (mmol/L): 13 (calc)

## 2017-10-17 LAB — COMPLETE METABOLIC PANEL WITH GFR
AG Ratio: 1.5 (calc) (ref 1.0–2.5)
ALT: 48 U/L — ABNORMAL HIGH (ref 6–29)
AST: 40 U/L — ABNORMAL HIGH (ref 10–35)
Albumin: 4.6 g/dL (ref 3.6–5.1)
Alkaline phosphatase (APISO): 85 U/L (ref 33–130)
BUN/Creatinine Ratio: 17 (calc) (ref 6–22)
BUN: 22 mg/dL (ref 7–25)
CO2: 23 mmol/L (ref 20–32)
Calcium: 10 mg/dL (ref 8.6–10.4)
Chloride: 102 mmol/L (ref 98–110)
Creat: 1.26 mg/dL — ABNORMAL HIGH (ref 0.50–0.99)
GFR, Est African American: 53 mL/min/{1.73_m2} — ABNORMAL LOW (ref >=60)
GFR, Est Non African American: 46 mL/min/{1.73_m2} — ABNORMAL LOW (ref >=60)
Globulin: 3 g/dL (calc) (ref 1.9–3.7)
Glucose, Bld: 201 mg/dL — ABNORMAL HIGH (ref 65–99)
Potassium: 4.5 mmol/L (ref 3.5–5.3)
Sodium: 139 mmol/L (ref 135–146)
Total Bilirubin: 0.3 mg/dL (ref 0.2–1.2)
Total Protein: 7.6 g/dL (ref 6.1–8.1)

## 2017-10-22 ENCOUNTER — Ambulatory Visit (INDEPENDENT_AMBULATORY_CARE_PROVIDER_SITE_OTHER): Payer: Medicare Other | Admitting: Family Medicine

## 2017-10-22 ENCOUNTER — Encounter: Payer: Self-pay | Admitting: Family Medicine

## 2017-10-22 VITALS — BP 128/82 | HR 87 | Resp 16 | Ht 61.0 in | Wt 187.0 lb

## 2017-10-22 DIAGNOSIS — E1169 Type 2 diabetes mellitus with other specified complication: Secondary | ICD-10-CM

## 2017-10-22 DIAGNOSIS — E1159 Type 2 diabetes mellitus with other circulatory complications: Secondary | ICD-10-CM

## 2017-10-22 DIAGNOSIS — E119 Type 2 diabetes mellitus without complications: Secondary | ICD-10-CM | POA: Diagnosis not present

## 2017-10-22 DIAGNOSIS — E669 Obesity, unspecified: Secondary | ICD-10-CM

## 2017-10-22 DIAGNOSIS — I10 Essential (primary) hypertension: Secondary | ICD-10-CM

## 2017-10-22 DIAGNOSIS — E785 Hyperlipidemia, unspecified: Secondary | ICD-10-CM | POA: Diagnosis not present

## 2017-10-22 NOTE — Patient Instructions (Addendum)
Wellness with nurse  past due please schedule, pt for flu vaccine at visit   Annual physical exam with MD Dec 6 or after  Non fasting HBA1C, c mp and EGFr, cBC and TSH  Nov 29 or shortly visit ( [facility])  Please get your eye exam  It is important that you exercise regularly at least 30 minutes 5 times a week. If you develop chest pain, have severe difficulty breathing, or feel very tired, stop exercising immediately and seek medical attention   Need to test and record blood sugar every  day  Goal for fasting blood sugar ranges from 80 to 120 and 2 hours after any meal or at bedtime should be between 130 to 170.  For jock itch use antifungal cream   We are referring you to diabetic educatoer , you NEED to go and start making changes form now

## 2017-10-22 NOTE — Progress Notes (Signed)
Michaela Peterson     MRN: [id number]      DOB: [birth year]   HPI Michaela Peterson is here for follow up and re-evaluation of chronic medical conditions, medication management and review of any available recent lab and radiology data.  Preventive health is updated, specifically  Cancer screening and Immunization.   Questions or concerns regarding consultations or procedures which the PT has had in the interim are  addressed. The PT denies any adverse reactions to current medications since the last visit.  Denies polyuria, polydipsia, blurred vision , or hypoglycemic episodes. Not testing blood sugar as she should be, and reports that it when she does often at 200  ROS Denies recent fever or chills. Denies sinus pressure, nasal congestion, ear pain or sore throat. Denies chest congestion, productive cough or wheezing. Denies chest pains, palpitations and leg swelling Denies abdominal pain, nausea, vomiting,diarrhea or constipation.   Denies dysuria, frequency, hesitancy or incontinence. Denies joint pain, swelling and limitation in mobility. Denies headaches, seizures, numbness, or tingling. Denies depression, anxiety or insomnia. Denies skin break down or rash.   PE  BP 128/82   Pulse 87   Resp 16   Ht 5\' 1"  (1.549 m)   Wt 187 lb (84.8 kg)   LMP 08/18/2012   SpO2 99%   BMI 35.33 kg/m   Patient alert and oriented and in no cardiopulmonary distress.  HEENT: No facial asymmetry, EOMI,   oropharynx pink and moist.  Neck supple no JVD, no mass.  Chest: Clear to auscultation bilaterally.  CVS: S1, S2 no murmurs, no S3.Regular rate.  ABD: Soft non tender.   Ext: No edema  MS: Adequate ROM spine, shoulders, hips and knees.  Skin: Intact, no ulcerations or rash noted.  Psych: Good eye contact, normal affect. Memory intact not anxious or depressed appearing.  CNS: CN 2-12 intact, power,  normal throughout.no focal deficits noted.   Assessment & Plan  Diabetes mellitus type 2 in  obese [facility]) Deteriorated Refer to educator and also needs to re engage in healthy lifestyle Michaela Peterson is reminded of the importance of commitment to daily physical activity for 30 minutes or more, as able and the need to limit carbohydrate intake to 30 to 60 grams per meal to help with blood sugar control.   The need to take medication as prescribed, test blood sugar as directed, and to call between visits if there is a concern that blood sugar is uncontrolled is also discussed.   Michaela Peterson is reminded of the importance of daily foot exam, annual eye examination, and good blood sugar, blood pressure and cholesterol control.  Diabetic Labs Latest Ref Rng & Units 10/16/2017 05/20/2017 02/11/2017 12/19/2016 10/10/2016  HbA1c <5.7 % of total Hgb 9.8(H) 7.6(H) 7.9(H) - 8.1(H)  Microalbumin mg/dL - - 1.2 - -  Micro/Creat Ratio <30 mcg/mg creat - - 8 - -  Chol <200 mg/dL 179 - - - -  HDL >50 mg/dL 63 - - - -  Calc LDL mg/dL (calc) 99 - - - -  Triglycerides <150 mg/dL 78 - - - -  Creatinine 0.50 - 0.99 mg/dL 1.26(H) 1.06(H) 0.99 1.13(H) 0.77   BP/Weight 10/22/2017 09/17/2017 08/08/2017 07/31/2017 07/30/2017 06/13/2017 9/39/0300  Systolic BP 923 300 762 263 - 335 456  Diastolic BP 82 82 78 72 - 78 78  Wt. (Lbs) 187 188.4 189 - 204 186.2 187  BMI 35.33 35.6 35.71 - 38.55 35.18 35.33   Foot/eye exam completion dates Latest Ref  Rng & Units 02/13/2017 04/03/2016  Eye Exam No Retinopathy - No Retinopathy  Foot Form Completion - Done -        Morbid obesity ([place]) unchanged. Patient re-educated about  the importance of commitment to a  minimum of 150 minutes of exercise per week.  The importance of healthy food choices with portion control discussed. Encouraged to start a food diary, count calories and to consider  joining a support group. Sample diet sheets offered. Goals set by the patient for the next several months.   Weight /BMI 10/22/2017 09/17/2017 08/08/2017  WEIGHT 187 lb 188 lb 6.4 oz 189 lb    HEIGHT 5\' 1"  5\' 1"  5\' 1"   BMI 35.33 kg/m2 35.6 kg/m2 35.71 kg/m2      Hypertension goal BP (blood pressure) < 140/90 .conm1 DASH diet and commitment to daily physical activity for a minimum of 30 minutes discussed and encouraged, as a part of hypertension management. The importance of attaining a healthy weight is also discussed.  BP/Weight 10/22/2017 09/17/2017 08/08/2017 07/31/2017 07/30/2017 06/13/2017 9/76/7341  Systolic BP 937 902 409 735 - 329 924  Diastolic BP 82 82 78 72 - 78 78  Wt. (Lbs) 187 188.4 189 - 204 186.2 187  BMI 35.33 35.6 35.71 - 38.55 35.18 35.33       Hyperlipidemia LDL goal <100 Controlled, no change in medication Hyperlipidemia:Low fat diet discussed and encouraged.   Lipid Panel  Lab Results  Component Value Date   CHOL 179 10/16/2017   HDL 63 10/16/2017   LDLCALC 99 10/16/2017   TRIG 78 10/16/2017   CHOLHDL 2.8 10/16/2017

## 2017-10-24 ENCOUNTER — Encounter: Payer: Self-pay | Admitting: Family Medicine

## 2017-10-24 NOTE — Assessment & Plan Note (Signed)
.  conm1 DASH diet and commitment to daily physical activity for a minimum of 30 minutes discussed and encouraged, as a part of hypertension management. The importance of attaining a healthy weight is also discussed.  BP/Weight 10/22/2017 09/17/2017 08/08/2017 07/31/2017 07/30/2017 06/13/2017 2/56/7209  Systolic BP 198 022 179 810 - 254 862  Diastolic BP 82 82 78 72 - 78 78  Wt. (Lbs) 187 188.4 189 - 204 186.2 187  BMI 35.33 35.6 35.71 - 38.55 35.18 35.33

## 2017-10-24 NOTE — Assessment & Plan Note (Signed)
Controlled, no change in medication Hyperlipidemia:Low fat diet discussed and encouraged.   Lipid Panel  Lab Results  Component Value Date   CHOL 179 10/16/2017   HDL 63 10/16/2017   LDLCALC 99 10/16/2017   TRIG 78 10/16/2017   CHOLHDL 2.8 10/16/2017

## 2017-10-24 NOTE — Assessment & Plan Note (Signed)
unchanged. Patient re-educated about  the importance of commitment to a  minimum of 150 minutes of exercise per week.  The importance of healthy food choices with portion control discussed. Encouraged to start a food diary, count calories and to consider  joining a support group. Sample diet sheets offered. Goals set by the patient for the next several months.   Weight /BMI 10/22/2017 09/17/2017 08/08/2017  WEIGHT 187 lb 188 lb 6.4 oz 189 lb  HEIGHT 5\' 1"  5\' 1"  5\' 1"   BMI 35.33 kg/m2 35.6 kg/m2 35.71 kg/m2

## 2017-10-24 NOTE — Assessment & Plan Note (Signed)
Deteriorated Refer to educator and also needs to re engage in healthy lifestyle Michaela Peterson is reminded of the importance of commitment to daily physical activity for 30 minutes or more, as able and the need to limit carbohydrate intake to 30 to 60 grams per meal to help with blood sugar control.   The need to take medication as prescribed, test blood sugar as directed, and to call between visits if there is a concern that blood sugar is uncontrolled is also discussed.   Michaela Peterson is reminded of the importance of daily foot exam, annual eye examination, and good blood sugar, blood pressure and cholesterol control.  Diabetic Labs Latest Ref Rng & Units 10/16/2017 05/20/2017 02/11/2017 12/19/2016 10/10/2016  HbA1c <5.7 % of total Hgb 9.8(H) 7.6(H) 7.9(H) - 8.1(H)  Microalbumin mg/dL - - 1.2 - -  Micro/Creat Ratio <30 mcg/mg creat - - 8 - -  Chol <200 mg/dL 179 - - - -  HDL >50 mg/dL 63 - - - -  Calc LDL mg/dL (calc) 99 - - - -  Triglycerides <150 mg/dL 78 - - - -  Creatinine 0.50 - 0.99 mg/dL 1.26(H) 1.06(H) 0.99 1.13(H) 0.77   BP/Weight 10/22/2017 09/17/2017 08/08/2017 07/31/2017 07/30/2017 06/13/2017 2/44/0102  Systolic BP 725 366 440 347 - 425 956  Diastolic BP 82 82 78 72 - 78 78  Wt. (Lbs) 187 188.4 189 - 204 186.2 187  BMI 35.33 35.6 35.71 - 38.55 35.18 35.33   Foot/eye exam completion dates Latest Ref Rng & Units 02/13/2017 04/03/2016  Eye Exam No Retinopathy - No Retinopathy  Foot Form Completion - Done -

## 2017-10-25 DIAGNOSIS — G4733 Obstructive sleep apnea (adult) (pediatric): Secondary | ICD-10-CM | POA: Diagnosis not present

## 2017-11-01 ENCOUNTER — Ambulatory Visit (INDEPENDENT_AMBULATORY_CARE_PROVIDER_SITE_OTHER): Payer: Medicare Other

## 2017-11-01 VITALS — BP 120/64 | HR 72 | Resp 16 | Ht 61.0 in | Wt 187.0 lb

## 2017-11-01 DIAGNOSIS — Z0000 Encounter for general adult medical examination without abnormal findings: Secondary | ICD-10-CM

## 2017-11-01 NOTE — Patient Instructions (Addendum)
Michaela Peterson , Thank you for taking time to come for your Medicare Wellness Visit. I appreciate your ongoing commitment to your health goals. Please review the following plan we discussed and let me know if I can assist you in the future.   Screening recommendations/referrals: Colonoscopy: up- to date    Mammogram: up to date  Bone Density: n/a Recommended yearly ophthalmology/optometry visit for glaucoma screening and checkup Recommended yearly dental visit for hygiene and checkup  Vaccinations: Influenza vaccine: due now- walk in on fridays from 8-12 Pneumococcal vaccine: up to date Tdap vaccine: up to date   Shingles vaccine: ask insurance if shingrix is covered     Advanced directives: done. Form given   Conditions/risks identified: done   Next appointment: scheduled  Preventive Care 40-64 Years, Female Preventive care refers to lifestyle choices and visits with your health care provider that can promote health and wellness. What does preventive care include?  A yearly physical exam. This is also called an annual well check.  Dental exams once or twice a year.  Routine eye exams. Ask your health care provider how often you should have your eyes checked.  Personal lifestyle choices, including:  Daily care of your teeth and gums.  Regular physical activity.  Eating a healthy diet.  Avoiding tobacco and drug use.  Limiting alcohol use.  Practicing safe sex.  Taking low-dose aspirin daily starting at age 59.  Taking vitamin and mineral supplements as recommended by your health care provider. What happens during an annual well check? The services and screenings done by your health care provider during your annual well check will depend on your age, overall health, lifestyle risk factors, and family history of disease. Counseling  Your health care provider may ask you questions about your:  Alcohol use.  Tobacco use.  Drug use.  Emotional well-being.  Home and  relationship well-being.  Sexual activity.  Eating habits.  Work and work Statistician.  Method of birth control.  Menstrual cycle.  Pregnancy history. Screening  You may have the following tests or measurements:  Height, weight, and BMI.  Blood pressure.  Lipid and cholesterol levels. These may be checked every 5 years, or more frequently if you are over 95 years old.  Skin check.  Lung cancer screening. You may have this screening every year starting at age 14 if you have a 30-pack-year history of smoking and currently smoke or have quit within the past 15 years.  Fecal occult blood test (FOBT) of the stool. You may have this test every year starting at age 34.  Flexible sigmoidoscopy or colonoscopy. You may have a sigmoidoscopy every 5 years or a colonoscopy every 10 years starting at age 62.  Hepatitis C blood test.  Hepatitis B blood test.  Sexually transmitted disease (STD) testing.  Diabetes screening. This is done by checking your blood sugar (glucose) after you have not eaten for a while (fasting). You may have this done every 1-3 years.  Mammogram. This may be done every 1-2 years. Talk to your health care provider about when you should start having regular mammograms. This may depend on whether you have a family history of breast cancer.  BRCA-related cancer screening. This may be done if you have a family history of breast, ovarian, tubal, or peritoneal cancers.  Pelvic exam and Pap test. This may be done every 3 years starting at age 30. Starting at age 34, this may be done every 5 years if you have a Pap  test in combination with an HPV test.  Bone density scan. This is done to screen for osteoporosis. You may have this scan if you are at high risk for osteoporosis. Discuss your test results, treatment options, and if necessary, the need for more tests with your health care provider. Vaccines  Your health care provider may recommend certain vaccines, such  as:  Influenza vaccine. This is recommended every year.  Tetanus, diphtheria, and acellular pertussis (Tdap, Td) vaccine. You may need a Td booster every 10 years.  Zoster vaccine. You may need this after age 51.  Pneumococcal 13-valent conjugate (PCV13) vaccine. You may need this if you have certain conditions and were not previously vaccinated.  Pneumococcal polysaccharide (PPSV23) vaccine. You may need one or two doses if you smoke cigarettes or if you have certain conditions. Talk to your health care provider about which screenings and vaccines you need and how often you need them. This information is not intended to replace advice given to you by your health care provider. Make sure you discuss any questions you have with your health care provider. Document Released: 03/25/2015 Document Revised: 11/16/2015 Document Reviewed: 12/28/2014 Elsevier Interactive Patient Education  2017 [place] Prevention in the Home Falls can cause injuries. They can happen to people of all ages. There are many things you can do to make your home safe and to help prevent falls. What can I do on the outside of my home?  Regularly fix the edges of walkways and driveways and fix any cracks.  Remove anything that might make you trip as you walk through a door, such as a raised step or threshold.  Trim any bushes or trees on the path to your home.  Use bright outdoor lighting.  Clear any walking paths of anything that might make someone trip, such as rocks or tools.  Regularly check to see if handrails are loose or broken. Make sure that both sides of any steps have handrails.  Any raised decks and porches should have guardrails on the edges.  Have any leaves, snow, or ice cleared regularly.  Use sand or salt on walking paths during winter.  Clean up any spills in your garage right away. This includes oil or grease spills. What can I do in the bathroom?  Use night  lights.  Install grab bars by the toilet and in the tub and shower. Do not use towel bars as grab bars.  Use non-skid mats or decals in the tub or shower.  If you need to sit down in the shower, use a plastic, non-slip stool.  Keep the floor dry. Clean up any water that spills on the floor as soon as it happens.  Remove soap buildup in the tub or shower regularly.  Attach bath mats securely with double-sided non-slip rug tape.  Do not have throw rugs and other things on the floor that can make you trip. What can I do in the bedroom?  Use night lights.  Make sure that you have a light by your bed that is easy to reach.  Do not use any sheets or blankets that are too big for your bed. They should not hang down onto the floor.  Have a firm chair that has side arms. You can use this for support while you get dressed.  Do not have throw rugs and other things on the floor that can make you trip. What can I do in the kitchen?  Clean up  any spills right away.  Avoid walking on wet floors.  Keep items that you use a lot in easy-to-reach places.  If you need to reach something above you, use a strong step stool that has a grab bar.  Keep electrical cords out of the way.  Do not use floor polish or wax that makes floors slippery. If you must use wax, use non-skid floor wax.  Do not have throw rugs and other things on the floor that can make you trip. What can I do with my stairs?  Do not leave any items on the stairs.  Make sure that there are handrails on both sides of the stairs and use them. Fix handrails that are broken or loose. Make sure that handrails are as long as the stairways.  Check any carpeting to make sure that it is firmly attached to the stairs. Fix any carpet that is loose or worn.  Avoid having throw rugs at the top or bottom of the stairs. If you do have throw rugs, attach them to the floor with carpet tape.  Make sure that you have a light switch at the  top of the stairs and the bottom of the stairs. If you do not have them, ask someone to add them for you. What else can I do to help prevent falls?  Wear shoes that:  Do not have high heels.  Have rubber bottoms.  Are comfortable and fit you well.  Are closed at the toe. Do not wear sandals.  If you use a stepladder:  Make sure that it is fully opened. Do not climb a closed stepladder.  Make sure that both sides of the stepladder are locked into place.  Ask someone to hold it for you, if possible.  Clearly mark and make sure that you can see:  Any grab bars or handrails.  First and last steps.  Where the edge of each step is.  Use tools that help you move around (mobility aids) if they are needed. These include:  Canes.  Walkers.  Scooters.  Crutches.  Turn on the lights when you go into a dark area. Replace any light bulbs as soon as they burn out.  Set up your furniture so you have a clear path. Avoid moving your furniture around.  If any of your floors are uneven, fix them.  If there are any pets around you, be aware of where they are.  Review your medicines with your doctor. Some medicines can make you feel dizzy. This can increase your chance of falling. Ask your doctor what other things that you can do to help prevent falls. This information is not intended to replace advice given to you by your health care provider. Make sure you discuss any questions you have with your health care provider. Document Released: 12/23/2008 Document Revised: 08/04/2015 Document Reviewed: 04/02/2014 Elsevier Interactive Patient Education  2017 [employer].

## 2017-11-01 NOTE — Progress Notes (Signed)
Subjective:   Michaela Peterson is a 62 y.o. female who presents for Medicare Annual (Subsequent) preventive examination.  Review of Systems:   Cardiac Risk Factors include: diabetes mellitus;dyslipidemia;obesity (BMI >30kg/m2);sedentary lifestyle     Objective:     Vitals: BP 120/64   Pulse 72   Resp 16   Ht _0  (1.549 m)   Wt 187 lb (84.8 kg)   LMP 08/18/2012   SpO2 97%   BMI 35.33 kg/m   Body mass index is 35.33 kg/m.  Advanced Directives 11/01/2017 07/30/2017 12/19/2016 06/19/2016 12/14/2015 08/15/2015 07/26/2015  Does Patient Have a Medical Advance Directive? _1  No No  Would patient like information on creating a medical advance directive? Yes (ED - Information included in AVS) - No - Patient declined No - Patient declined No - patient declined information No - patient declined information No - patient declined information  Pre-existing out of facility DNR order (yellow form or pink MOST form) - - - - - - -    Tobacco Social History   Tobacco Use  Smoking Status Never Smoker  Smokeless Tobacco Never Used  Tobacco Comment   patient lives with a smoker      Counseling given: Not Answered Comment: patient lives with a smoker    Clinical Intake:  Pre-visit preparation completed: No  Pain : No/denies pain Pain Score: 0-No pain     Nutritional Status: BMI > 30  Obese Diabetes: Yes  How often do you need to have someone help you when you read instructions, pamphlets, or other written materials from your doctor or pharmacy?: 2 - Rarely What is the last grade level you completed in school?: college   Interpreter Needed?: No  Information entered by :: Rozelia Catapano LPN   Past Medical History:  Diagnosis Date  . Adnexal mass    Left; followed by Dr. [name]  . Anemia, iron deficiency   . Arthritis   . Chronic low back pain   . Diabetes mellitus, type 2 ([place])   . Essential hypertension   . Gastroparesis   . GERD (gastroesophageal reflux disease)     . MS (multiple sclerosis) ([place])   . Nonalcoholic fatty liver disease   . Obesity   . Obesity, diabetes, and hypertension syndrome ([place]) 03/12/2009   Qualifier: Diagnosis of  By: Moshe Cipro MD, Joycelyn Schmid    . Sleep apnea 2009   Past Surgical History:  Procedure Laterality Date  . AGILE CAPSULE N/A 08/11/2014   Procedure: AGILE CAPSULE;  Surgeon: Daneil Dolin, MD;  [facility];  Service: Endoscopy;  Laterality: N/A;  0700  . BLADDER SUSPENSION    . CESAREAN SECTION     x2   . CHOLECYSTECTOMY    . COLONOSCOPY  2010   Dr. [name]: tubular adenoma, few scattered diverticula  . COLONOSCOPY N/A 10/01/2013   Dr. Pixie Casino preparation. Normal rectum. Normal colonic mucosa  . DILATION AND CURETTAGE OF UTERUS     4  . ESOPHAGOGASTRODUODENOSCOPY  2010   Dr. [name]: normal esophagus, small hiatal hernia, questionable pale duodenal mucosa but negative for celiac sprue.   . ESOPHAGOGASTRODUODENOSCOPY N/A 10/01/2013   Dr. Rourk:gastric polyps-status post biopsy. Otherwise, normal EGD. fundic gland polyp, negative H.pylori  . GIVENS CAPSULE STUDY N/A 09/08/2014   Poor prep but overall unremarkable   . KNEE ARTHROSCOPY W/ DEBRIDEMENT    . LAPAROSCOPY ABDOMEN DIAGNOSTIC     lysis of adhesions  . ROTATOR CUFF REPAIR    .  TENDON LENGTHENING     Left wrist  . UMBILICAL HERNIA REPAIR     Family History  Problem Relation Age of Onset  . Hypertension Sister   . Diabetes Sister   . Heart disease Sister   . Kidney failure Sister   . Heart failure Mother   . Hypertension Mother   . Arthritis Mother   . Stroke Father   . Hypertension Son   . Hypertension Sister   . Hypertension Brother   . Mental illness Brother   . Colon cancer Maternal Aunt    Social History   Socioeconomic History  . Marital status: Married    Spouse name: [name]   . Number of children: 4  . Years of education: 10  . Highest education level: Not on file  Occupational History  . Occupation: unemployed      Fish farm manager: UNEMPLOYED  Social Needs  . Financial resource strain: Somewhat hard  . Food insecurity:    Worry: Never true    Inability: Never true  . Transportation needs:    Medical: No    Non-medical: No  Tobacco Use  . Smoking status: Never Smoker  . Smokeless tobacco: Never Used  . Tobacco comment: patient lives with a smoker   Substance and Sexual Activity  . Alcohol use: No  . Drug use: No  . Sexual activity: Yes  Lifestyle  . Physical activity:    Days per week: 0 days    Minutes per session: 0 min  . Stress: Only a little  Relationships  . Social connections:    Talks on phone: More than three times a week    Gets together: Three times a week    Attends religious service: More than 4 times per year    Active member of club or organization: No    Attends meetings of clubs or organizations: Never    Relationship status: Married  Other Topics Concern  . Not on file  Social History Narrative   Patient lives at home with husband [name].    Patient has 4 children.    Patient has a some college.    Patient is right handed.    Patient is currently not working.     Outpatient Encounter Medications as of 11/01/2017  Medication Sig  . allopurinol (ZYLOPRIM) 100 MG tablet TAKE 1 TABLET BY MOUTH  DAILY  . aspirin EC 81 MG tablet Take 81 mg by mouth daily.  Marland Kitchen azelastine (ASTELIN) 0.1 % nasal spray Place 2 sprays into both nostrils 2 (two) times daily. Use in each nostril as directed  . blood glucose meter kit and supplies Dispense based on patient and insurance preference. Use up to four times daily as directed. (FOR ICD-9 250.00, 250.01).  . cetirizine (ZYRTEC) 10 MG tablet Take 1 tablet (10 mg total) by mouth daily.  . cyclobenzaprine (FLEXERIL) 10 MG tablet Take 1 tablet (10 mg total) by mouth 3 (three) times daily as needed for muscle spasms.  . diphenhydrAMINE (BENADRYL) 25 MG tablet Take 25 mg by mouth every 6 (six) hours as needed for itching.  Marland Kitchen glipiZIDE (GLUCOTROL) 10  MG tablet TAKE 1 TABLET BY MOUTH 2  TIMES DAILY BEFORE MEALS  . glucose blood (ONE TOUCH ULTRA TEST) test strip USE UP TO 4 TIMES DAILY AS  DIRECTED  . metFORMIN (GLUCOPHAGE) 1000 MG tablet TAKE 1 TABLET BY MOUTH TWO  TIMES DAILY WITH MEALS  . montelukast (SINGULAIR) 10 MG tablet TAKE 1 TABLET (10 MG TOTAL)  BY MOUTH AT BEDTIME.  Marland Kitchen NIFEdipine (ADALAT CC) 90 MG 24 hr tablet TAKE 1 TABLET BY MOUTH  DAILY  . ONETOUCH DELICA LANCETS 36I MISC USE UP TO 4 TIMES DAILY AS  DIRECTED  . pantoprazole (PROTONIX) 40 MG tablet Take 1 tablet (40 mg total) by mouth 2 (two) times daily before a meal.  . triamcinolone (NASACORT AQ) 55 MCG/ACT AERO nasal inhaler Place 2 sprays into the nose daily. (Patient taking differently: Place 2 sprays into the nose as needed. )  . triamterene-hydrochlorothiazide (MAXZIDE-25) 37.5-25 MG tablet TAKE 1 TABLET BY MOUTH  DAILY   No facility-administered encounter medications on file as of 11/01/2017.     Activities of Daily Living In your present state of health, do you have any difficulty performing the following activities: 11/01/2017 11/01/2017  Hearing? N N  Vision? N N  Difficulty concentrating or making decisions? N N  Walking or climbing stairs? Y Y  Dressing or bathing? N N  Doing errands, shopping? N N  Preparing Food and eating ? N N  Using the Toilet? N N  In the past six months, have you accidently leaked urine? N N  Do you have problems with loss of bowel control? N N  Managing your Medications? N N  Managing your Finances? N N  Housekeeping or managing your Housekeeping? N N  Some recent data might be hidden    Patient Care Team: [name], MD as PCP - General Rourk, Cristopher Estimable, MD as Consulting Physician (Gastroenterology) Dennie Bible, NP as Nurse Practitioner (Family Medicine) Elsie Saas, MD as Consulting Physician (Orthopedic Surgery) Satira Sark, MD as Consulting Physician (Cardiology)    Assessment:   This is a routine  wellness examination for Drea.  Exercise Activities and Dietary recommendations Current Exercise Habits: The patient does not participate in regular exercise at present, Exercise limited by: orthopedic condition(s)  Goals   None     Fall Risk Fall Risk  11/01/2017 11/01/2017 10/11/2016 05/11/2016 08/13/2014  Falls in the past year? No No Yes No No  Number falls in past yr: - - 1 - -   Is the patient's home free of loose throw rugs in walkways, pet beds, electrical cords, etc?   yes      Grab bars in the bathroom? yes      Handrails on the stairs?   yes      Adequate lighting?   yes  Timed Get Up and Go performed:  Depression Screen PHQ 2/9 Scores 11/01/2017 11/01/2017 10/22/2017 08/08/2017  PHQ - 2 Score 0 0 1 0  PHQ- 9 Score _0 Cognitive Function     6CIT Screen 11/01/2017  What Year? 0 points  What month? 0 points  What time? 0 points  Count back from 20 0 points  Months in reverse 0 points  Repeat phrase 0 points  Total Score 0    Immunization History  Administered Date(s) Administered  . Influenza Split 01/17/2011, 01/09/2012  . Influenza Whole 12/11/2006, 12/29/2008, 11/28/2009  . Influenza,inj,Quad PF,6+ Mos 01/08/2013, 12/23/2013, 01/18/2015, 12/14/2015, 12/19/2016  . Pneumococcal Conjugate-13 10/19/2013  . Pneumococcal Polysaccharide-23 11/30/2003, 08/30/2010  . Td 11/30/2003  . Tdap 12/23/2013  . Zoster 02/29/2016    Qualifies for Shingles Vaccine?  Screening Tests Health Maintenance  Topic Date Due  . OPHTHALMOLOGY EXAM  04/03/2017  . INFLUENZA VACCINE  10/10/2017  . URINE MICROALBUMIN  02/11/2018  . FOOT EXAM  02/16/2018  .  HEMOGLOBIN A1C  04/18/2018  . PAP SMEAR  06/08/2018  . MAMMOGRAM  09/10/2019  . COLONOSCOPY  10/02/2023  . TETANUS/TDAP  12/24/2023  . PNEUMOCOCCAL POLYSACCHARIDE VACCINE AGE 58-64 HIGH RISK  Completed  . Hepatitis C Screening  Completed  . HIV Screening  Completed    Cancer Screenings: Lung: Low Dose CT Chest  recommended if Age 8-80 years, 30 pack-year currently smoking OR have quit w/in 15years. Patient does not qualify. Breast:  Up to date on Mammogram? Yes   Up to date of Bone Density/Dexa? No n/a Colorectal:  Up to date   Additional Screenings:  Hepatitis C Screening:      Plan:     I have personally reviewed and noted the following in the patient's chart:   . Medical and social history . Use of alcohol, tobacco or illicit drugs  . Current medications and supplements . Functional ability and status . Nutritional status . Physical activity . Advanced directives . List of other physicians . Hospitalizations, surgeries, and ER visits in previous 12 months . Vitals . Screenings to include cognitive, depression, and falls . Referrals and appointments  In addition, I have reviewed and discussed with patient certain preventive protocols, quality metrics, and best practice recommendations. A written personalized care plan for preventive services as well as general preventive health recommendations were provided to patient.     Kate Sable, LPN, LPN  [id number]

## 2017-11-06 DIAGNOSIS — G4733 Obstructive sleep apnea (adult) (pediatric): Secondary | ICD-10-CM | POA: Diagnosis not present

## 2017-11-25 DIAGNOSIS — G4733 Obstructive sleep apnea (adult) (pediatric): Secondary | ICD-10-CM | POA: Diagnosis not present

## 2017-12-09 ENCOUNTER — Ambulatory Visit: Payer: Medicare Other | Admitting: Gastroenterology

## 2017-12-09 ENCOUNTER — Encounter: Payer: Self-pay | Admitting: Gastroenterology

## 2017-12-09 VITALS — BP 146/73 | HR 66 | Temp 97.0°F | Ht 61.0 in | Wt 189.6 lb

## 2017-12-09 DIAGNOSIS — R945 Abnormal results of liver function studies: Secondary | ICD-10-CM | POA: Diagnosis not present

## 2017-12-09 DIAGNOSIS — R1012 Left upper quadrant pain: Secondary | ICD-10-CM | POA: Insufficient documentation

## 2017-12-09 DIAGNOSIS — R7989 Other specified abnormal findings of blood chemistry: Secondary | ICD-10-CM

## 2017-12-09 NOTE — Patient Instructions (Signed)
Continue Protonix twice a day, 30 minutes before a meal. It works best when taken like this.  Call me if discomfort does not improve!  We will see you back end of November/early December. We will review your liver numbers at that time. In the meantime, continue the healthy eating, exercise as you are starting to do.  It was a pleasure to see you today. I strive to create trusting relationships with patients to provide genuine, compassionate, and quality care. I value your feedback. If you receive a survey regarding your visit,  I greatly appreciate you taking time to fill this out.   Annitta Needs, PhD, ANP-BC [facility]

## 2017-12-09 NOTE — Progress Notes (Signed)
Referring Provider: [name], MD Primary Care Physician:  [name], MD Primary GI: Dr. [name]   Chief Complaint  Patient presents with  . Gastroesophageal Reflux    HPI:   Michaela Peterson is a 62 y.o. female presenting today with a history of low normal ferritin, low iron, Hgb 12 range historically. Colonoscopy/EGD in July 2015 and capsule unremarkable June 2016. Constipation, GERD chronic. History of gastroparesis.   At end of day notes LUQ pain. Keeps a lot of gas since using CPAP machine. Has epigastric discomfort. No dysphagia with solid foods. Some issues with pills. Notes pain after eating at times, LUQ. Takes Protonix BID but not usually taking it on an empty stomach. No lack of appetite. No weight loss. Notes early satiety. Sometimes nausea. No vomiting.   Mildly elevated transaminases. Known history of fatty liver on CT in 2015.   Past Medical History:  Diagnosis Date  . Adnexal mass    Left; followed by Dr. [name]  . Anemia, iron deficiency   . Arthritis   . Chronic low back pain   . Diabetes mellitus, type 2 ([place])   . Essential hypertension   . Gastroparesis   . GERD (gastroesophageal reflux disease)   . MS (multiple sclerosis) ([place])   . Nonalcoholic fatty liver disease   . Obesity   . Obesity, diabetes, and hypertension syndrome ([place]) 03/12/2009   Qualifier: Diagnosis of  By: Moshe Cipro MD, Joycelyn Schmid    . Sleep apnea 2009    Past Surgical History:  Procedure Laterality Date  . AGILE CAPSULE N/A 08/11/2014   Procedure: AGILE CAPSULE;  Surgeon: Daneil Dolin, MD;  [facility];  Service: Endoscopy;  Laterality: N/A;  0700  . BLADDER SUSPENSION    . CESAREAN SECTION     x2   . CHOLECYSTECTOMY    . COLONOSCOPY  2010   Dr. [name]: tubular adenoma, few scattered diverticula  . COLONOSCOPY N/A 10/01/2013   Dr. Pixie Casino preparation. Normal rectum. Normal colonic mucosa  . DILATION AND CURETTAGE OF UTERUS     4  .  ESOPHAGOGASTRODUODENOSCOPY  2010   Dr. [name]: normal esophagus, small hiatal hernia, questionable pale duodenal mucosa but negative for celiac sprue.   . ESOPHAGOGASTRODUODENOSCOPY N/A 10/01/2013   Dr. Rourk:gastric polyps-status post biopsy. Otherwise, normal EGD. fundic gland polyp, negative H.pylori  . GIVENS CAPSULE STUDY N/A 09/08/2014   Poor prep but overall unremarkable   . KNEE ARTHROSCOPY W/ DEBRIDEMENT    . LAPAROSCOPY ABDOMEN DIAGNOSTIC     lysis of adhesions  . ROTATOR CUFF REPAIR    . TENDON LENGTHENING     Left wrist  . UMBILICAL HERNIA REPAIR      Current Outpatient Medications  Medication Sig Dispense Refill  . allopurinol (ZYLOPRIM) 100 MG tablet TAKE 1 TABLET BY MOUTH  DAILY 90 tablet 1  . aspirin EC 81 MG tablet Take 81 mg by mouth daily.    Marland Kitchen azelastine (ASTELIN) 0.1 % nasal spray Place 2 sprays into both nostrils 2 (two) times daily. Use in each nostril as directed 30 mL 12  . blood glucose meter kit and supplies Dispense based on patient and insurance preference. Use up to four times daily as directed. (FOR ICD-9 250.00, 250.01). 1 each 0  . cetirizine (ZYRTEC) 10 MG tablet Take 1 tablet (10 mg total) by mouth daily. 30 tablet 0  . cyclobenzaprine (FLEXERIL) 10 MG tablet Take 1 tablet (10 mg total) by mouth 3 (three)  times daily as needed for muscle spasms. 42 tablet 0  . diphenhydrAMINE (BENADRYL) 25 MG tablet Take 25 mg by mouth every 6 (six) hours as needed for itching.    Marland Kitchen glipiZIDE (GLUCOTROL) 10 MG tablet TAKE 1 TABLET BY MOUTH 2  TIMES DAILY BEFORE MEALS 180 tablet 3  . glucose blood (ONE TOUCH ULTRA TEST) test strip USE UP TO 4 TIMES DAILY AS  DIRECTED 400 each 1  . metFORMIN (GLUCOPHAGE) 1000 MG tablet TAKE 1 TABLET BY MOUTH TWO  TIMES DAILY WITH MEALS 180 tablet 1  . montelukast (SINGULAIR) 10 MG tablet TAKE 1 TABLET (10 MG TOTAL) BY MOUTH AT BEDTIME. 90 tablet 1  . NIFEdipine (ADALAT CC) 90 MG 24 hr tablet TAKE 1 TABLET BY MOUTH  DAILY 90 tablet 3  .  ONETOUCH DELICA LANCETS 02O MISC USE UP TO 4 TIMES DAILY AS  DIRECTED 400 each 1  . pantoprazole (PROTONIX) 40 MG tablet Take 1 tablet (40 mg total) by mouth 2 (two) times daily before a meal. 180 tablet 3  . triamcinolone (NASACORT AQ) 55 MCG/ACT AERO nasal inhaler Place 2 sprays into the nose daily. (Patient taking differently: Place 2 sprays into the nose as needed. ) 1 Inhaler 0  . triamterene-hydrochlorothiazide (MAXZIDE-25) 37.5-25 MG tablet TAKE 1 TABLET BY MOUTH  DAILY 90 tablet 1   No current facility-administered medications for this visit.     Allergies as of 12/09/2017 - Review Complete 12/09/2017  Allergen Reaction Noted  . Gabapentin Shortness Of Breath 02/13/2017  . Ace inhibitors Cough 03/26/2012  . Aspirin    . Oxycodone-acetaminophen Hives 02/21/2007  . Penicillins Hives   . Statins  10/11/2012    Family History  Problem Relation Age of Onset  . Hypertension Sister   . Diabetes Sister   . Heart disease Sister   . Kidney failure Sister   . Heart failure Mother   . Hypertension Mother   . Arthritis Mother   . Stroke Father   . Hypertension Son   . Hypertension Sister   . Hypertension Brother   . Mental illness Brother   . Colon cancer Maternal Aunt     Social History   Socioeconomic History  . Marital status: Married    Spouse name: [name]   . Number of children: 4  . Years of education: 39  . Highest education level: Not on file  Occupational History  . Occupation: unemployed     Fish farm manager: UNEMPLOYED  Social Needs  . Financial resource strain: Somewhat hard  . Food insecurity:    Worry: Never true    Inability: Never true  . Transportation needs:    Medical: No    Non-medical: No  Tobacco Use  . Smoking status: Never Smoker  . Smokeless tobacco: Never Used  . Tobacco comment: patient lives with a smoker   Substance and Sexual Activity  . Alcohol use: No  . Drug use: No  . Sexual activity: Yes  Lifestyle  . Physical activity:    Days per  week: 0 days    Minutes per session: 0 min  . Stress: Only a little  Relationships  . Social connections:    Talks on phone: More than three times a week    Gets together: Three times a week    Attends religious service: More than 4 times per year    Active member of club or organization: No    Attends meetings of clubs or organizations: Never  Relationship status: Married  Other Topics Concern  . Not on file  Social History Narrative   Patient lives at home with husband [name].    Patient has 4 children.    Patient has a some college.    Patient is right handed.    Patient is currently not working.     Review of Systems: Gen: Denies fever, chills, anorexia. Denies fatigue, weakness, weight loss.  CV: Denies chest pain, palpitations, syncope, peripheral edema, and claudication. Resp: Denies dyspnea at rest, cough, wheezing, coughing up blood, and pleurisy. GI: see HPI  Derm: Denies rash, itching, dry skin Psych: Denies depression, anxiety, memory loss, confusion. No homicidal or suicidal ideation.  Heme: Denies bruising, bleeding, and enlarged lymph nodes.  Physical Exam: BP (!) 146/73   Pulse 66   Temp (!) 97 F (36.1 C) (Oral)   Ht _0  (1.549 m)   Wt 189 lb 9.6 oz (86 kg)   LMP 08/18/2012   BMI 35.82 kg/m  General:   Alert and oriented. No distress noted. Pleasant and cooperative.  Head:  Normocephalic and atraumatic. Eyes:  Conjuctiva clear without scleral icterus. Mouth:  Oral mucosa pink and moist.  Abdomen:  +BS, soft, mild epigastric TTP and LUQ TTP and non-distended. No rebound or guarding. No HSM or masses noted. Msk:  Symmetrical without gross deformities. Normal posture. Extremities:  Without edema. Neurologic:  Alert and  oriented x4 Psych:  Alert and cooperative. Normal mood and affect.  Lab Results  Component Value Date   ALT 48 (H) 10/16/2017   AST 40 (H) 10/16/2017   ALKPHOS 85 12/19/2016   BILITOT 0.3 10/16/2017

## 2017-12-09 NOTE — Progress Notes (Signed)
CC'D TO PCP °

## 2017-12-09 NOTE — Assessment & Plan Note (Signed)
62 year old female with chronic abdominal pain, noting LUQ abdominal pain, early satiety, but no lack of appetite or weight loss. Intermittent nausea noted. Currently prescribed PPI BID but not taking on empty stomach as best absorbed. Weight stable/increasing. Discussed EGD as last was in 2015. She desires to hold off on this currently. She will take Protonix BID before meals, monitor eating habits, call if any further issues. Return end of November/early December.

## 2017-12-09 NOTE — Assessment & Plan Note (Signed)
Known history of fatty liver. Mildly elevated transaminases with repeat due in November through PCP. We will have her return in Nov/December to review this as well. Recommend [place] if further elevation or increasing. She will work on [employer] modification in interim.

## 2017-12-10 ENCOUNTER — Encounter: Payer: Self-pay | Admitting: Nutrition

## 2017-12-10 ENCOUNTER — Encounter: Payer: Medicare Other | Attending: Family Medicine | Admitting: Nutrition

## 2017-12-10 VITALS — Ht 61.0 in | Wt 188.0 lb

## 2017-12-10 DIAGNOSIS — E1165 Type 2 diabetes mellitus with hyperglycemia: Secondary | ICD-10-CM

## 2017-12-10 DIAGNOSIS — E119 Type 2 diabetes mellitus without complications: Secondary | ICD-10-CM | POA: Insufficient documentation

## 2017-12-10 DIAGNOSIS — IMO0002 Reserved for concepts with insufficient information to code with codable children: Secondary | ICD-10-CM

## 2017-12-10 DIAGNOSIS — E669 Obesity, unspecified: Secondary | ICD-10-CM | POA: Diagnosis present

## 2017-12-10 DIAGNOSIS — Z6835 Body mass index (BMI) 35.0-35.9, adult: Secondary | ICD-10-CM | POA: Insufficient documentation

## 2017-12-10 DIAGNOSIS — E118 Type 2 diabetes mellitus with unspecified complications: Secondary | ICD-10-CM

## 2017-12-10 DIAGNOSIS — Z713 Dietary counseling and surveillance: Secondary | ICD-10-CM | POA: Insufficient documentation

## 2017-12-10 NOTE — Patient Instructions (Signed)
Goals 1 Follow My Plate 2. Eat three meals and eat breakfast daily 3. Cut out Pepsis. 4. Drink only water.

## 2017-12-10 NOTE — Progress Notes (Signed)
  Medical Nutrition Therapy:  Appt start time: 1100 end time:  1200.   Assessment:  Primary concerns today: Diabetes Type 2. Lives with her husband.  Her grown sons lives with her and grandkids.  She does the cooking and shopping with her husband and sons. Eats 2 meals and lots of snacks.  Glipizide and Metformin 1000 mg BID. PMH: Kidney issues. Is under a lot of stress.  Exercise ADL. Has bad knees.  Desires to lose weight. Has had to take a lot of prednisone for her knees and shoulder and that has made her A1C go up to 8.9%. Complains of stomach hurting due to indigestion. She takes Protonix for reflux but doesn't help. She reports the soda helps with her indigestion. Doesn't go to bed til Midnight. Has to use her CPAP machine but doesn't like it. Doesn't sleep well. Eats only 1-2 meals per day; skipping breakfast Willing to make changes with her eating habits and schedule to improve her DM and lose weight. Current diet is excessive in calories and carbs.   Lab Results  Component Value Date   HGBA1C 9.8 (H) 10/16/2017    Preferred Learning Style:  No preference indicated   Learning Readiness:   Ready  Change in progress   MEDICATIONS:   DIETARY INTAKE:     24-hr recall:  B ( AM): skps usually,  1/2 c oatmeal, apple,  water  Snk ( AM):   L ( PM): fried chicken mashed potaotes, cookies, soda Snk ( PM): usally chips, water D ( PM): 1-2 cup pasta salad and 2 chicken legs, water Snk ( PM):  Beverages:soda ,water  Usual physical activity: ADL  Estimated energy needs: 1200  calories 135 g carbohydrates 90 g protein 33 g fat  Progress Towards Goal(s):  In progress.   Nutritional Diagnosis:  NB-1.1 Food and nutrition-related knowledge deficit As related to Diabetes Type 2.  As evidenced by A1C 8.9% .    Intervention:  Nutrition and Diabetes education provided on My Plate, CHO counting, meal planning, portion sizes, timing of meals, avoiding snacks between meals  unless having a low blood sugar, target ranges for A1C and blood sugars, signs/symptoms and treatment of hyper/hypoglycemia, monitoring blood sugars, taking medications as prescribed, benefits of exercising 30 minutes per day and prevention of complications of DM.  Goals 1 Follow My Plate 2. Eat three meals and eat breakfast daily 3. Cut out Pepsis. 4. Drink only water.    Teaching Method Utilized:  Visual Auditory Hands on  Handouts given during visit include:  The Plate Method   Meal Plan Card   Barriers to learning/adherence to lifestyle change: none  Demonstrated degree of understanding via:  Teach Back   Monitoring/Evaluation:  Dietary intake, exercise, meal plannig, and body weight in 1 month(s).  Current use of GLipizide, the risks outweigh the benefits. Would highly recommend to stop Glipizide and try Tradjenta for better blood sugar control and help with weight loss. She may need low dose of long acting insulin to control her DM since she has had it for 30 years.

## 2017-12-20 ENCOUNTER — Other Ambulatory Visit: Payer: Self-pay

## 2017-12-20 ENCOUNTER — Ambulatory Visit (INDEPENDENT_AMBULATORY_CARE_PROVIDER_SITE_OTHER): Payer: Medicare Other

## 2017-12-20 DIAGNOSIS — Z23 Encounter for immunization: Secondary | ICD-10-CM

## 2017-12-20 MED ORDER — FLUCONAZOLE 150 MG PO TABS: 150.0000 mg | ORAL_TABLET | Freq: Once | ORAL | 0 refills | Status: AC

## 2017-12-25 ENCOUNTER — Other Ambulatory Visit: Payer: Self-pay | Admitting: Family Medicine

## 2017-12-25 DIAGNOSIS — G4733 Obstructive sleep apnea (adult) (pediatric): Secondary | ICD-10-CM | POA: Diagnosis not present

## 2018-01-14 ENCOUNTER — Ambulatory Visit: Payer: Medicare Other | Admitting: Nutrition

## 2018-01-20 DIAGNOSIS — E119 Type 2 diabetes mellitus without complications: Secondary | ICD-10-CM | POA: Diagnosis not present

## 2018-01-20 LAB — HM DIABETES EYE EXAM

## 2018-01-25 DIAGNOSIS — G4733 Obstructive sleep apnea (adult) (pediatric): Secondary | ICD-10-CM | POA: Diagnosis not present

## 2018-01-28 ENCOUNTER — Ambulatory Visit: Payer: Medicare Other | Admitting: Nutrition

## 2018-01-28 ENCOUNTER — Telehealth: Payer: Self-pay | Admitting: Nutrition

## 2018-01-28 NOTE — Telephone Encounter (Signed)
Unable to leave VM to reschedule missed appt.

## 2018-02-10 DIAGNOSIS — E1169 Type 2 diabetes mellitus with other specified complication: Secondary | ICD-10-CM | POA: Diagnosis not present

## 2018-02-10 DIAGNOSIS — I10 Essential (primary) hypertension: Secondary | ICD-10-CM | POA: Diagnosis not present

## 2018-02-10 DIAGNOSIS — D649 Anemia, unspecified: Secondary | ICD-10-CM | POA: Diagnosis not present

## 2018-02-10 NOTE — Progress Notes (Signed)
Referring Provider: [name], MD Primary Care Physician:  [name], MD  Primary GI: Dr. [name]   Chief Complaint  Patient presents with  . Gastroesophageal Reflux    HPI:   Michaela Peterson is a 62 y.o. female presenting today with a history of  low normal ferritin, low iron, Hgb 12 range historically. Colonoscopy/EGD in July 2015 and capsule unremarkable June 2016.Constipation, GERD chronic. History of gastroparesis. Mildly elevated transaminases in the past with plans to pursue ultrasound if continues to remain elevated.   LUQ noted at last visit. Discussed appropriate dosing of PPI. Last EGD in 2015 and desired to hold off on this at last visit. Transaminases improved, with ALT now 34 (down from 48) and AST normal at 34. Hgb 10.7, decreased from one year ago when it was 12. Ferritin, iron, and B12 added by Dr. Moshe Cipro after review of labs.   Still with epigastric/LUQ discomfort. Intermittently. No associated with eating/drinking. Denies nocturnal symptoms. No dysphagia. Still with reflux exacerbations. Mild nausea yesterday. No NSAIDs. Declining EGD currently. Weight stable. Trying to make eating changes. Difficulty exercising.   Bloated noting with wearing CPAP. Has lots of gas during the day.    Past Medical History:  Diagnosis Date  . Adnexal mass    Left; followed by Dr. [name]  . Anemia, iron deficiency   . Arthritis   . Chronic low back pain   . Diabetes mellitus, type 2 ([place])   . Essential hypertension   . Gastroparesis   . GERD (gastroesophageal reflux disease)   . MS (multiple sclerosis) ([place])   . Nonalcoholic fatty liver disease   . Obesity   . Obesity, diabetes, and hypertension syndrome ([place]) 03/12/2009   Qualifier: Diagnosis of  By: Moshe Cipro MD, Joycelyn Schmid    . Sleep apnea 2009    Past Surgical History:  Procedure Laterality Date  . AGILE CAPSULE N/A 08/11/2014   Procedure: AGILE CAPSULE;  Surgeon: Daneil Dolin, MD;  Location: [facility];  Service: Endoscopy;  Laterality: N/A;  0700  . BLADDER SUSPENSION    . CESAREAN SECTION     x2   . CHOLECYSTECTOMY    . COLONOSCOPY  2010   Dr. [name]: tubular adenoma, few scattered diverticula  . COLONOSCOPY N/A 10/01/2013   Dr. Pixie Casino preparation. Normal rectum. Normal colonic mucosa  . DILATION AND CURETTAGE OF UTERUS     4  . ESOPHAGOGASTRODUODENOSCOPY  2010   Dr. [name]: normal esophagus, small hiatal hernia, questionable pale duodenal mucosa but negative for celiac sprue.   . ESOPHAGOGASTRODUODENOSCOPY N/A 10/01/2013   Dr. Rourk:gastric polyps-status post biopsy. Otherwise, normal EGD. fundic gland polyp, negative H.pylori  . GIVENS CAPSULE STUDY N/A 09/08/2014   Poor prep but overall unremarkable   . KNEE ARTHROSCOPY W/ DEBRIDEMENT    . LAPAROSCOPY ABDOMEN DIAGNOSTIC     lysis of adhesions  . ROTATOR CUFF REPAIR    . TENDON LENGTHENING     Left wrist  . UMBILICAL HERNIA REPAIR      Current Outpatient Medications  Medication Sig Dispense Refill  . allopurinol (ZYLOPRIM) 100 MG tablet TAKE 1 TABLET BY MOUTH  DAILY 90 tablet 1  . aspirin EC 81 MG tablet Take 81 mg by mouth daily.    Marland Kitchen azelastine (ASTELIN) 0.1 % nasal spray Place 2 sprays into both nostrils 2 (two) times daily. Use in each nostril as directed 30 mL 12  . blood glucose meter kit and supplies Dispense based  on patient and insurance preference. Use up to four times daily as directed. (FOR ICD-9 250.00, 250.01). 1 each 0  . cetirizine (ZYRTEC) 10 MG tablet Take 1 tablet (10 mg total) by mouth daily. 30 tablet 0  . cyclobenzaprine (FLEXERIL) 10 MG tablet Take 1 tablet (10 mg total) by mouth 3 (three) times daily as needed for muscle spasms. 42 tablet 0  . diphenhydrAMINE (BENADRYL) 25 MG tablet Take 25 mg by mouth every 6 (six) hours as needed for itching.    Marland Kitchen glipiZIDE (GLUCOTROL) 10 MG tablet TAKE 1 TABLET BY MOUTH 2  TIMES DAILY BEFORE MEALS 180 tablet 3  . glucose blood (ONE TOUCH ULTRA TEST) test  strip USE UP TO 4 TIMES DAILY AS  DIRECTED 400 each 1  . metFORMIN (GLUCOPHAGE) 1000 MG tablet TAKE 1 TABLET BY MOUTH TWO  TIMES DAILY WITH MEALS 180 tablet 1  . montelukast (SINGULAIR) 10 MG tablet TAKE 1 TABLET (10 MG TOTAL) BY MOUTH AT BEDTIME. 90 tablet 1  . NIFEdipine (ADALAT CC) 90 MG 24 hr tablet TAKE 1 TABLET BY MOUTH  DAILY 90 tablet 3  . ONETOUCH DELICA LANCETS 36R MISC USE UP TO 4 TIMES DAILY AS  DIRECTED 400 each 1  . pantoprazole (PROTONIX) 40 MG tablet Take 1 tablet (40 mg total) by mouth 2 (two) times daily before a meal. 180 tablet 3  . triamcinolone (NASACORT AQ) 55 MCG/ACT AERO nasal inhaler Place 2 sprays into the nose daily. (Patient taking differently: Place 2 sprays into the nose as needed. ) 1 Inhaler 0  . triamterene-hydrochlorothiazide (MAXZIDE-25) 37.5-25 MG tablet TAKE 1 TABLET BY MOUTH  DAILY 90 tablet 1   No current facility-administered medications for this visit.     Allergies as of 02/11/2018 - Review Complete 02/11/2018  Allergen Reaction Noted  . Gabapentin Shortness Of Breath 02/13/2017  . Ace inhibitors Cough 03/26/2012  . Aspirin    . Oxycodone-acetaminophen Hives 02/21/2007  . Penicillins Hives   . Statins  10/11/2012    Family History  Problem Relation Age of Onset  . Hypertension Sister   . Diabetes Sister   . Heart disease Sister   . Kidney failure Sister   . Heart failure Mother   . Hypertension Mother   . Arthritis Mother   . Stroke Father   . Hypertension Son   . Hypertension Sister   . Hypertension Brother   . Mental illness Brother   . Colon cancer Maternal Aunt     Social History   Socioeconomic History  . Marital status: Married    Spouse name: Michaela Peterson   . Number of children: 4  . Years of education: 89  . Highest education level: Not on file  Occupational History  . Occupation: unemployed     Fish farm manager: UNEMPLOYED  Social Needs  . Financial resource strain: Somewhat hard  . Food insecurity:    Worry: Never true     Inability: Never true  . Transportation needs:    Medical: No    Non-medical: No  Tobacco Use  . Smoking status: Never Smoker  . Smokeless tobacco: Never Used  . Tobacco comment: patient lives with a smoker   Substance and Sexual Activity  . Alcohol use: No  . Drug use: No  . Sexual activity: Yes  Lifestyle  . Physical activity:    Days per week: 0 days    Minutes per session: 0 min  . Stress: Only a little  Relationships  . Social connections:  Talks on phone: More than three times a week    Gets together: Three times a week    Attends religious service: More than 4 times per year    Active member of club or organization: No    Attends meetings of clubs or organizations: Never    Relationship status: Married  Other Topics Concern  . Not on file  Social History Narrative   Patient lives at home with husband Michaela Peterson.    Patient has 4 children.    Patient has a some college.    Patient is right handed.    Patient is currently not working.     Review of Systems: Gen: Denies fever, chills, anorexia. Denies fatigue, weakness, weight loss.  CV: Denies chest pain, palpitations, syncope, peripheral edema, and claudication. Resp: Denies dyspnea at rest, cough, wheezing, coughing up blood, and pleurisy. GI: see HPI  Derm: Denies rash, itching, dry skin Psych: Denies depression, anxiety, memory loss, confusion. No homicidal or suicidal ideation.  Heme: Denies bruising, bleeding, and enlarged lymph nodes.  Physical Exam: BP (!) 153/67   Pulse 68   Temp (!) 96.5 F (35.8 C) (Oral)   Ht _0  (1.549 m)   Wt 186 lb (84.4 kg)   LMP 08/18/2012   BMI 35.14 kg/m  General:   Alert and oriented. No distress noted. Pleasant and cooperative.  Head:  Normocephalic and atraumatic. Eyes:  Conjuctiva clear without scleral icterus. Mouth:  Oral mucosa pink and moist. Good dentition. No lesions. Abdomen:  +BS, soft, non-tender and non-distended. No rebound or guarding. No HSM or masses  noted. Msk:  Symmetrical without gross deformities. Normal posture. Extremities:  Without edema. Neurologic:  Alert and  oriented x4 Psych:  Alert and cooperative. Normal mood and affect.

## 2018-02-11 ENCOUNTER — Ambulatory Visit: Payer: Medicare Other | Admitting: Gastroenterology

## 2018-02-11 ENCOUNTER — Encounter: Payer: Self-pay | Admitting: Gastroenterology

## 2018-02-11 VITALS — BP 153/67 | HR 68 | Temp 96.5°F | Ht 61.0 in | Wt 186.0 lb

## 2018-02-11 DIAGNOSIS — R1013 Epigastric pain: Secondary | ICD-10-CM

## 2018-02-11 NOTE — Patient Instructions (Signed)
I would like for you to try a different reflux medication. Stop Protonix for now. Start taking Dexilant once each morning. I have provided samples. Let me know how this works for you, and I can send to the pharmacy!  I recommend an upper endoscopy. Let me know if you decide to move forward with this.  I will see you in 3 months regardless!  Keep up the good work with the eating changes!  I enjoyed seeing you again today! As you know, I value our relationship and want to provide genuine, compassionate, and quality care. I welcome your feedback. If you receive a survey regarding your visit,  I greatly appreciate you taking time to fill this out. See you next time!  Annitta Needs, PhD, ANP-BC [facility]

## 2018-02-11 NOTE — Assessment & Plan Note (Signed)
62 year old female with reflux exacerbations, epigastric/LUQ discomfort, no dysphagia. No improvement with PPI BID. Declining EGD, as this was recommended at last visit (last in 2015). I also note she has declining Hgb from baseline 12 range to now 10.7. Iron/ferritin added on by PCP.   Recommend EGD when patient is agreeable. Change Protonix to Dexilant samples to trial Await iron studies 3 month return regardless

## 2018-02-12 LAB — T4, FREE: Free T4: 1.5 ng/dL (ref 0.8–1.8)

## 2018-02-12 LAB — CBC
HCT: 32 % — ABNORMAL LOW (ref 35.0–45.0)
Hemoglobin: 10.7 g/dL — ABNORMAL LOW (ref 11.7–15.5)
MCH: 27.7 pg (ref 27.0–33.0)
MCHC: 33.4 g/dL (ref 32.0–36.0)
MCV: 82.9 fL (ref 80.0–100.0)
MPV: 10.1 fL (ref 7.5–12.5)
Platelets: 409 10*3/uL — ABNORMAL HIGH (ref 140–400)
RBC: 3.86 10*6/uL (ref 3.80–5.10)
RDW: 14.2 % (ref 11.0–15.0)
WBC: 7.8 10*3/uL (ref 3.8–10.8)

## 2018-02-12 LAB — HEMOGLOBIN A1C
Hgb A1c MFr Bld: 8 % of total Hgb — ABNORMAL HIGH (ref <5.7)
Mean Plasma Glucose: 183 (calc)
eAG (mmol/L): 10.1 (calc)

## 2018-02-12 LAB — TSH: TSH: 6.03 mIU/L — ABNORMAL HIGH (ref 0.40–4.50)

## 2018-02-12 LAB — COMPLETE METABOLIC PANEL WITH GFR
AG Ratio: 1.4 (calc) (ref 1.0–2.5)
ALT: 34 U/L — ABNORMAL HIGH (ref 6–29)
AST: 34 U/L (ref 10–35)
Albumin: 4.3 g/dL (ref 3.6–5.1)
Alkaline phosphatase (APISO): 81 U/L (ref 33–130)
BUN: 14 mg/dL (ref 7–25)
CO2: 26 mmol/L (ref 20–32)
Calcium: 9.8 mg/dL (ref 8.6–10.4)
Chloride: 105 mmol/L (ref 98–110)
Creat: 0.97 mg/dL (ref 0.50–0.99)
GFR, Est African American: 73 mL/min/{1.73_m2} (ref >=60)
GFR, Est Non African American: 63 mL/min/{1.73_m2} (ref >=60)
Globulin: 3 g/dL (calc) (ref 1.9–3.7)
Glucose, Bld: 108 mg/dL — ABNORMAL HIGH (ref 65–99)
Potassium: 4 mmol/L (ref 3.5–5.3)
Sodium: 139 mmol/L (ref 135–146)
Total Bilirubin: 0.4 mg/dL (ref 0.2–1.2)
Total Protein: 7.3 g/dL (ref 6.1–8.1)

## 2018-02-12 LAB — T3, FREE: T3, Free: 2.9 pg/mL (ref 2.3–4.2)

## 2018-02-12 LAB — IRON: Iron: 41 ug/dL — ABNORMAL LOW (ref 45–160)

## 2018-02-12 LAB — FERRITIN: Ferritin: 80 ng/mL (ref 16–288)

## 2018-02-12 LAB — VITAMIN B12: Vitamin B-12: 325 pg/mL (ref 200–1100)

## 2018-02-12 NOTE — Progress Notes (Signed)
cc'ed to pcp °

## 2018-02-19 ENCOUNTER — Encounter: Payer: Self-pay | Admitting: Family Medicine

## 2018-02-19 ENCOUNTER — Other Ambulatory Visit (HOSPITAL_COMMUNITY)
Admission: RE | Admit: 2018-02-19 | Discharge: 2018-02-19 | Disposition: A | Discharge status: Home / Self Care | Payer: Medicare Other | Source: Ambulatory Visit | Attending: Family Medicine | Admitting: Family Medicine

## 2018-02-19 ENCOUNTER — Ambulatory Visit (INDEPENDENT_AMBULATORY_CARE_PROVIDER_SITE_OTHER): Payer: Medicare Other | Admitting: Family Medicine

## 2018-02-19 VITALS — BP 130/80 | HR 76 | Resp 15 | Ht 61.0 in | Wt 184.0 lb

## 2018-02-19 DIAGNOSIS — I10 Essential (primary) hypertension: Secondary | ICD-10-CM

## 2018-02-19 DIAGNOSIS — R102 Pelvic and perineal pain: Secondary | ICD-10-CM

## 2018-02-19 DIAGNOSIS — E1149 Type 2 diabetes mellitus with other diabetic neurological complication: Secondary | ICD-10-CM

## 2018-02-19 DIAGNOSIS — E785 Hyperlipidemia, unspecified: Secondary | ICD-10-CM

## 2018-02-19 DIAGNOSIS — Z0000 Encounter for general adult medical examination without abnormal findings: Secondary | ICD-10-CM

## 2018-02-19 DIAGNOSIS — J309 Allergic rhinitis, unspecified: Secondary | ICD-10-CM | POA: Diagnosis not present

## 2018-02-19 DIAGNOSIS — R519 Headache, unspecified: Secondary | ICD-10-CM | POA: Insufficient documentation

## 2018-02-19 DIAGNOSIS — Z124 Encounter for screening for malignant neoplasm of cervix: Secondary | ICD-10-CM | POA: Insufficient documentation

## 2018-02-19 DIAGNOSIS — G35 Multiple sclerosis: Secondary | ICD-10-CM

## 2018-02-19 DIAGNOSIS — R51 Headache: Secondary | ICD-10-CM

## 2018-02-19 MED ORDER — CETIRIZINE HCL 10 MG PO TABS: 10.0000 mg | ORAL_TABLET | Freq: Every day | ORAL | 1 refills | Status: DC

## 2018-02-19 MED ORDER — TRIAMCINOLONE ACETONIDE 55 MCG/ACT NA AERO: 2.0000 | INHALATION_SPRAY | Freq: Every day | NASAL | 3 refills | Status: DC

## 2018-02-19 MED ORDER — ROSUVASTATIN CALCIUM 5 MG PO TABS: ORAL_TABLET | ORAL | 3 refills | Status: DC

## 2018-02-19 MED ORDER — AZELASTINE HCL 0.1 % NA SOLN: 2.0000 | Freq: Two times a day (BID) | NASAL | 3 refills | Status: DC

## 2018-02-19 NOTE — Assessment & Plan Note (Signed)
Increased and uncontrolled symptoms of nasal congestion and clear sinus drainage x 4 to 6 weeks, medications are prescribed, Astelin and zyrtec, no h/o fever or chills

## 2018-02-19 NOTE — Assessment & Plan Note (Signed)
5 week h/o intermittent sharp headaches of less than 5 minutes duration, sharp shooting pains, has not had Neurology eval for over 1 year will refer. No neurologic deficit on exam

## 2018-02-19 NOTE — Progress Notes (Signed)
Michaela Peterson     MRN: [id number]      DOB: 04-Jun-1955  HPI: Patient is in for annual physical exam. 5 week h/o daily pelvic pain, not sexually active , denies post menopausal bleed or vaginal discharge or odor  c/o intermittent sharp stabbing headaches on average 4 times per week for the past 6 weeks, no associated nausea, or sensory deficit, episodes last less than 5 minutes, thinks they are related to uncontrolled allergies  Immunization is reviewed , and is up to date   PE: BP 130/80   Pulse 76   Resp 15   Ht 5\' 1"  (1.549 m)   Wt 184 lb (83.5 kg)   LMP 08/18/2012   SpO2 98%   BMI 34.77 kg/m   Pleasant  female, alert and oriented x 3, in no cardio-pulmonary distress. Afebrile. HEENT No facial trauma or asymetry. Sinuses non tender.  Extra occullar muscles intact, pupils equally reactive to light.Supraorbital edema and mild conjunctival injection bilaterally with clear drainage External ears normal, tympanic membranes clear. Oropharynx moist, no exudate.Nasal mucosa erythematous and edematous Neck: supple, no adenopathy,JVD or thyromegaly.No bruits.  Chest: Clear to ascultation bilaterally.No crackles or wheezes. Non tender to palpation  Breast: No asymetry,no masses or lumps. No tenderness. No nipple discharge or inversion. No axillary or supraclavicular adenopathy  Cardiovascular system; Heart sounds normal,  S1 and  S2 ,no S3.  No murmur, or thrill. Apical beat not displaced Peripheral pulses normal.  Abdomen: Soft, non tender, no organomegaly or masses. Bilateral lower abdominal/ pelvic tenderness, no rebound or gurding No bruits. Bowel sounds normal. No guarding, tenderness or rebound.    GU: External genitalia normal female genitalia , normal female distribution of hair. No lesions. Urethral meatus normal in size, no  Prolapse, no lesions visibly  Present. Bladder non tender. Vagina pink and moist , with no visible lesions , discharge present  . Adequate pelvic support no  cystocele or rectocele noted Cervix pink and appears healthy, no lesions or ulcerations noted, no discharge noted from os Uterus normal size, no adnexal masses, no cervical motion tenderness, adnexal fullness in left with tenderness bilaterally.   Musculoskeletal exam: Full ROM of spine, hips , shoulders and knees. No deformity ,swelling or crepitus noted. No muscle wasting or atrophy.   Neurologic: Cranial nerves 2 to 12 intact. Power, tone ,sensation and reflexes normal throughout. No disturbance in gait. No tremor.  Skin: Intact, no ulceration, erythema , scaling or rash noted. Pigmentation normal throughout  Psych; Normal mood and affect. Judgement and concentration normal   Assessment & Plan:  Annual physical exam Annual exam as documented. Counseling done  re healthy lifestyle involving commitment to 150 minutes exercise per week, heart healthy diet, and attaining healthy weight.The importance of adequate sleep also discussed.  Changes in health habits are decided on by the patient with goals and time frames  set for achieving them. Immunization and cancer screening needs are specifically addressed at this visit.   Type 2 diabetes mellitus with neurological complications (HCC) Improved, no med change , needs to commit to exercise Ms. Witcher is reminded of the importance of commitment to daily physical activity for 30 minutes or more, as able and the need to limit carbohydrate intake to 30 to 60 grams per meal to help with blood sugar control.   The need to take medication as prescribed, test blood sugar as directed, and to call between visits if there is a concern that blood sugar is uncontrolled  is also discussed.   Ms. Warbington is reminded of the importance of daily foot exam, annual eye examination, and good blood sugar, blood pressure and cholesterol control.  Diabetic Labs Latest Ref Rng & Units 02/10/2018 10/16/2017 05/20/2017 02/11/2017  12/19/2016  HbA1c <5.7 % of total Hgb 8.0(H) 9.8(H) 7.6(H) 7.9(H) -  Microalbumin mg/dL - - - 1.2 -  Micro/Creat Ratio <30 mcg/mg creat - - - 8 -  Chol <200 mg/dL - 179 - - -  HDL >50 mg/dL - 63 - - -  Calc LDL mg/dL (calc) - 99 - - -  Triglycerides <150 mg/dL - 78 - - -  Creatinine 0.50 - 0.99 mg/dL 0.97 1.26(H) 1.06(H) 0.99 1.13(H)   BP/Weight 02/19/2018 02/11/2018 12/10/2017 12/09/2017 11/01/2017 3/78/5885 0/04/7739  Systolic BP 287 867 - 672 094 709 628  Diastolic BP 80 67 - 73 64 82 82  Wt. (Lbs) 184 186 188 189.6 187 187 188.4  BMI 34.77 35.14 35.52 35.82 35.33 35.33 35.6   Foot/eye exam completion dates Latest Ref Rng & Units 02/19/2018 02/13/2017  Eye Exam No Retinopathy - -  Foot Form Completion - Done Done        Pelvic pain 5 week h/o daily pelvic pain, needs [place], physical exam bilateral adnexal  tenderness, fullness in left adnexa, questionable mass  MULTIPLE SCLEROSIS 5 week h/o intermittent sharp headaches of less than 5 minutes duration, sharp shooting pains, has not had Neurology eval for over 1 year will refer. No neurologic deficit on exam  Frequent headaches 6 week h/o intermittent sharp headaches, on average 3 to 4 times weekly, duration less than 5 minutes, pt with MS and normal neuro exam at visit, needs f/u with neurology  Allergic rhinitis Increased and uncontrolled symptoms of nasal congestion and clear sinus drainage x 4 to 6 weeks, medications are prescribed, Astelin and zyrtec, no h/o fever or chills

## 2018-02-19 NOTE — Assessment & Plan Note (Signed)
Improved, no med change , needs to commit to exercise Michaela Peterson is reminded of the importance of commitment to daily physical activity for 30 minutes or more, as able and the need to limit carbohydrate intake to 30 to 60 grams per meal to help with blood sugar control.   The need to take medication as prescribed, test blood sugar as directed, and to call between visits if there is a concern that blood sugar is uncontrolled is also discussed.   Michaela Peterson is reminded of the importance of daily foot exam, annual eye examination, and good blood sugar, blood pressure and cholesterol control.  Diabetic Labs Latest Ref Rng & Units 02/10/2018 10/16/2017 05/20/2017 02/11/2017 12/19/2016  HbA1c <5.7 % of total Hgb 8.0(H) 9.8(H) 7.6(H) 7.9(H) -  Microalbumin mg/dL - - - 1.2 -  Micro/Creat Ratio <30 mcg/mg creat - - - 8 -  Chol <200 mg/dL - 179 - - -  HDL >50 mg/dL - 63 - - -  Calc LDL mg/dL (calc) - 99 - - -  Triglycerides <150 mg/dL - 78 - - -  Creatinine 0.50 - 0.99 mg/dL 0.97 1.26(H) 1.06(H) 0.99 1.13(H)   BP/Weight 02/19/2018 02/11/2018 12/10/2017 12/09/2017 11/01/2017 5/92/9244 08/11/8636  Systolic BP 177 116 - 579 038 333 832  Diastolic BP 80 67 - 73 64 82 82  Wt. (Lbs) 184 186 188 189.6 187 187 188.4  BMI 34.77 35.14 35.52 35.82 35.33 35.33 35.6   Foot/eye exam completion dates Latest Ref Rng & Units 02/19/2018 02/13/2017  Eye Exam No Retinopathy - -  Foot Form Completion - Done Done

## 2018-02-19 NOTE — Assessment & Plan Note (Addendum)
Annual exam as documented. Counseling done  re healthy lifestyle involving commitment to 150 minutes exercise per week, heart healthy diet, and attaining healthy weight.The importance of adequate sleep also discussed. Changes in health habits are decided on by the patient with goals and time frames  set for achieving them. Immunization and cancer screening needs are specifically addressed at this visit. 

## 2018-02-19 NOTE — Assessment & Plan Note (Addendum)
5 week h/o daily pelvic pain, needs [place], physical exam bilateral adnexal  tenderness, fullness in left adnexa, questionable mass

## 2018-02-19 NOTE — Assessment & Plan Note (Signed)
6 week h/o intermittent sharp headaches, on average 3 to 4 times weekly, duration less than 5 minutes, pt with MS and normal neuro exam at visit, needs f/u with neurology

## 2018-02-19 NOTE — Patient Instructions (Addendum)
F/U in 13 weeks, call if you need me sooner  CONGRATS on improvement in your chronic di diseases, keep it up!  New for protection of heart and brain is 3 times weekly crestor, sent to mail order  Fasting lipid, cmp and eGFR and hBA1C in 12 weeks  You are referred for pelvic US due to pain  Please commit to taking iron regularly, this is low and you are  Anemic  Your medication for allergies has been prescriobed  You will be referred to [facility] to f/u MS and intermittent sharp headaches  It is important that you exercise regularly at least 30 minutes 5 times a week. If you develop chest pain, have severe difficulty breathing, or feel very tired, stop exercising immediately and seek medical attention  Thank you  for choosing [facility]. We consider it a privelige to serve you.  Delivering excellent health care in a caring and  compassionate way is our goal.  Partnering with you,  so that together we can achieve this goal is our strategy.

## 2018-02-20 ENCOUNTER — Other Ambulatory Visit (HOSPITAL_COMMUNITY)
Admission: RE | Admit: 2018-02-20 | Discharge: 2018-02-20 | Disposition: A | Discharge status: Home / Self Care | Payer: Medicare Other | Source: Other Acute Inpatient Hospital | Attending: Family Medicine | Admitting: Family Medicine

## 2018-02-20 DIAGNOSIS — E1149 Type 2 diabetes mellitus with other diabetic neurological complication: Secondary | ICD-10-CM | POA: Diagnosis not present

## 2018-02-21 ENCOUNTER — Ambulatory Visit (HOSPITAL_COMMUNITY): Payer: Medicare Other

## 2018-02-21 LAB — MICROALBUMIN / CREATININE URINE RATIO
Creatinine, Urine: 239.5 mg/dL
Microalb Creat Ratio: 25.3 mg/g creat (ref 0.0–30.0)
Microalb, Ur: 60.7 ug/mL — ABNORMAL HIGH

## 2018-02-26 LAB — CYTOLOGY - PAP
Diagnosis: NEGATIVE
HPV: NOT DETECTED

## 2018-02-27 ENCOUNTER — Encounter: Payer: Self-pay | Admitting: Family Medicine

## 2018-03-03 ENCOUNTER — Telehealth: Payer: Self-pay | Admitting: *Deleted

## 2018-03-03 NOTE — Telephone Encounter (Signed)
Pt was put on rosuvastatin for cholesterol and she was concerned because the side effects said elevates blood sugar. Wants to know if she should still take with her blood sugar.

## 2018-03-03 NOTE — Telephone Encounter (Signed)
Called pt back. Phone line busy x 2 times

## 2018-03-10 NOTE — Progress Notes (Signed)
Ferritin 80, similar to a year ago. Iron low at 41. Recommending EGD when patient agreeable.

## 2018-03-11 ENCOUNTER — Other Ambulatory Visit: Payer: Self-pay | Admitting: Family Medicine

## 2018-03-16 NOTE — Telephone Encounter (Signed)
Pls explain the medication will NOT raise her blood sugar and this is one of the mediucations recommended to lower risk of heart attack and stroke, so I recommend she continue as prescribed

## 2018-03-18 ENCOUNTER — Ambulatory Visit (HOSPITAL_COMMUNITY)
Admission: RE | Admit: 2018-03-18 | Discharge: 2018-03-18 | Disposition: A | Discharge status: Home / Self Care | Payer: Medicare Other | Source: Ambulatory Visit | Attending: Family Medicine | Admitting: Family Medicine

## 2018-03-18 DIAGNOSIS — R102 Pelvic and perineal pain: Secondary | ICD-10-CM | POA: Diagnosis not present

## 2018-03-18 NOTE — Telephone Encounter (Signed)
Spoke with patient she is aware

## 2018-03-26 ENCOUNTER — Other Ambulatory Visit: Payer: Self-pay | Admitting: Gastroenterology

## 2018-03-26 ENCOUNTER — Other Ambulatory Visit: Payer: Self-pay | Admitting: Family Medicine

## 2018-03-27 ENCOUNTER — Encounter: Payer: Self-pay | Admitting: Family Medicine

## 2018-03-27 ENCOUNTER — Ambulatory Visit (INDEPENDENT_AMBULATORY_CARE_PROVIDER_SITE_OTHER): Payer: Medicare Other | Admitting: Family Medicine

## 2018-03-27 VITALS — BP 116/58 | HR 77 | Resp 12 | Ht 61.0 in

## 2018-03-27 DIAGNOSIS — J0120 Acute ethmoidal sinusitis, unspecified: Secondary | ICD-10-CM | POA: Diagnosis not present

## 2018-03-27 DIAGNOSIS — H1013 Acute atopic conjunctivitis, bilateral: Secondary | ICD-10-CM | POA: Diagnosis not present

## 2018-03-27 DIAGNOSIS — N761 Subacute and chronic vaginitis: Secondary | ICD-10-CM

## 2018-03-27 DIAGNOSIS — J309 Allergic rhinitis, unspecified: Secondary | ICD-10-CM

## 2018-03-27 DIAGNOSIS — E1149 Type 2 diabetes mellitus with other diabetic neurological complication: Secondary | ICD-10-CM | POA: Diagnosis not present

## 2018-03-27 MED ORDER — DOXYCYCLINE HYCLATE 100 MG PO TABS: 100.0000 mg | ORAL_TABLET | Freq: Two times a day (BID) | ORAL | 0 refills | Status: DC

## 2018-03-27 MED ORDER — FLUCONAZOLE 150 MG PO TABS: ORAL_TABLET | ORAL | 0 refills | Status: DC

## 2018-03-27 MED ORDER — AZELASTINE HCL 0.05 % OP SOLN: 1.0000 [drp] | Freq: Two times a day (BID) | OPHTHALMIC | 12 refills | Status: DC

## 2018-03-27 MED ORDER — MOMETASONE FUROATE 50 MCG/ACT NA SUSP: 2.0000 | Freq: Every day | NASAL | 12 refills | Status: DC

## 2018-03-27 MED ORDER — AZELASTINE HCL 0.1 % NA SOLN: 2.0000 | Freq: Two times a day (BID) | NASAL | 12 refills | Status: DC

## 2018-03-27 NOTE — Patient Instructions (Signed)
F/U in March as before, call if you need me sooner  You are treated for uncontrolled allergies with eye drop and 2 nasal sprays  You are treated for infection in nose and drainage from eyes with antibiotic x 1 week   You have  2 tablets prescribed for itching , if continues I recommend gyne eval  Thank you  for choosing [facility]. We consider it a privelige to serve you.  Delivering excellent health care in a caring and  compassionate way is our goal.  Partnering with you,  so that together we can achieve this goal is our strategy.

## 2018-03-28 ENCOUNTER — Telehealth: Payer: Self-pay | Admitting: *Deleted

## 2018-03-28 ENCOUNTER — Other Ambulatory Visit: Payer: Self-pay

## 2018-03-28 MED ORDER — MOMETASONE FUROATE 50 MCG/ACT NA SUSP: 2.0000 | Freq: Every day | NASAL | 12 refills | Status: DC

## 2018-03-28 MED ORDER — AZELASTINE HCL 0.05 % OP SOLN: 1.0000 [drp] | Freq: Two times a day (BID) | OPHTHALMIC | 12 refills | Status: DC

## 2018-03-28 MED ORDER — AZELASTINE HCL 0.1 % NA SOLN: 2.0000 | Freq: Two times a day (BID) | NASAL | 12 refills | Status: DC

## 2018-03-28 NOTE — Telephone Encounter (Signed)
Spoke with patient and sent nasal sprays and eye medication to [facility] in [place].

## 2018-03-28 NOTE — Telephone Encounter (Signed)
Pt called she had went to [facility] to pick up medication. They were extremely high. She did pick up the fluconazole and the doxycycline but the rest was too high. She wanted to know if the rest of the medications could be sent to [facility] in [place]. She is on a fixed income and she will not be able to get them otherwise.

## 2018-03-29 ENCOUNTER — Encounter: Payer: Self-pay | Admitting: Family Medicine

## 2018-03-29 DIAGNOSIS — N760 Acute vaginitis: Secondary | ICD-10-CM

## 2018-03-29 DIAGNOSIS — H109 Unspecified conjunctivitis: Secondary | ICD-10-CM | POA: Insufficient documentation

## 2018-03-29 DIAGNOSIS — J019 Acute sinusitis, unspecified: Secondary | ICD-10-CM | POA: Insufficient documentation

## 2018-03-29 HISTORY — DX: Acute vaginitis: N76.0

## 2018-03-29 NOTE — Assessment & Plan Note (Signed)
Uncontrolled, pt to take zyrtec, Singulair , Astelin and nasonex daily as well as [place] saline flushes for symptom relief

## 2018-03-29 NOTE — Assessment & Plan Note (Signed)
Recurrent vaginal itch in setting of uncontrolled diabetes, fluconazole prescribed but  advised of need for Gyne eval if persists

## 2018-03-29 NOTE — Assessment & Plan Note (Signed)
Michaela Peterson is reminded of the importance of commitment to daily physical activity for 30 minutes or more, as able and the need to limit carbohydrate intake to 30 to 60 grams per meal to help with blood sugar control.   The need to take medication as prescribed, test blood sugar as directed, and to call between visits if there is a concern that blood sugar is uncontrolled is also discussed.   Michaela Peterson is reminded of the importance of daily foot exam, annual eye examination, and good blood sugar, blood pressure and cholesterol control. Gradually improving  Diabetic Labs Latest Ref Rng & Units 02/19/2018 02/10/2018 10/16/2017 05/20/2017 02/11/2017  HbA1c <5.7 % of total Hgb - 8.0(H) 9.8(H) 7.6(H) 7.9(H)  Microalbumin Not Estab. ug/mL 60.7(H) - - - 1.2  Micro/Creat Ratio 0.0 - 30.0 mg/g creat 25.3 - - - 8  Chol <200 mg/dL - - 179 - -  HDL >50 mg/dL - - 63 - -  Calc LDL mg/dL (calc) - - 99 - -  Triglycerides <150 mg/dL - - 78 - -  Creatinine 0.50 - 0.99 mg/dL - 0.97 1.26(H) 1.06(H) 0.99   BP/Weight 03/27/2018 02/19/2018 02/11/2018 12/10/2017 12/09/2017 11/01/2017 08/24/3792  Systolic BP 327 614 709 - 295 747 340  Diastolic BP 58 80 67 - 73 64 82  Wt. (Lbs) - 184 186 188 189.6 187 187  BMI 34.77 34.77 35.14 35.52 35.82 35.33 35.33   Foot/eye exam completion dates Latest Ref Rng & Units 02/19/2018 01/20/2018  Eye Exam No Retinopathy - Retinopathy(A)  Foot Form Completion - Done -

## 2018-03-29 NOTE — Assessment & Plan Note (Signed)
Doxycycline is prescribed

## 2018-03-29 NOTE — Progress Notes (Signed)
Michaela Peterson     MRN: [id number]      DOB: [birth year]   HPI Michaela Peterson is here with main c/o increased periorbital, especially supraorbital swelling, right greater than left with excessive tearing of her eyes, denies fever , chills , has watery nasal drainage C/o painful area  With direct pressure over left nostril C/o vaginal itching intermittently for months Preventive health is updated, specifically  Cancer screening and Immunization.      ROS Denies recent fever or chills. Denies sinus pressure, nasal congestion, ear pain or sore throat. Denies chest congestion, productive cough or wheezing. Denies chest pains, palpitations and leg swelling Denies abdominal pain, nausea, vomiting,diarrhea or constipation.   Denies dysuria, frequency, hesitancy or incontinence. Denies joint pain, swelling and limitation in mobility. Denies headaches, seizures, numbness, or tingling. Denies depression, anxiety or insomnia. Denies skin break down or rash.   PE  BP (!) 116/58   Pulse 77   Resp 12   Ht 5\' 1"  (1.549 m)   LMP 08/18/2012   SpO2 97% Comment: room air  BMI 34.77 kg/m   Patient alert and oriented and in no cardiopulmonary distress.  HEENT: No facial asymmetry, EOMI,   oropharynx pink and moist.  Neck supple no JVD, no mass.Supraorbital swelling right greater than left , excessive tearing from conjunctiva bilaterally, tender over left ethmoidal sinus  Chest: Clear to auscultation bilaterally.  CVS: S1, S2 no murmurs, no S3.Regular rate.  ABD: Soft non tender.   Ext: No edema  MS: Adequate ROM spine, shoulders, hips and knees.  Skin: Intact, no ulcerations or rash noted.  Psych: Good eye contact, normal affect. Memory intact not anxious or depressed appearing.  CNS: CN 2-12 intact, power,  normal throughout.no focal deficits noted.   Assessment & Plan  Allergic rhinitis Uncontrolled, pt to take zyrtec, Singulair , Astelin and nasonex daily as well as [place] saline  flushes for symptom relief  Conjunctivitis of both eyes Commitment to regular use of allergy eye drop encouraged and same is prescribed  Acute infection of nasal sinus Doxycycline is prescribed  Type 2 diabetes mellitus with neurological complications [facility]) Michaela Peterson is reminded of the importance of commitment to daily physical activity for 30 minutes or more, as able and the need to limit carbohydrate intake to 30 to 60 grams per meal to help with blood sugar control.   The need to take medication as prescribed, test blood sugar as directed, and to call between visits if there is a concern that blood sugar is uncontrolled is also discussed.   Michaela Peterson is reminded of the importance of daily foot exam, annual eye examination, and good blood sugar, blood pressure and cholesterol control. Gradually improving  Diabetic Labs Latest Ref Rng & Units 02/19/2018 02/10/2018 10/16/2017 05/20/2017 02/11/2017  HbA1c <5.7 % of total Hgb - 8.0(H) 9.8(H) 7.6(H) 7.9(H)  Microalbumin Not Estab. ug/mL 60.7(H) - - - 1.2  Micro/Creat Ratio 0.0 - 30.0 mg/g creat 25.3 - - - 8  Chol <200 mg/dL - - 179 - -  HDL >50 mg/dL - - 63 - -  Calc LDL mg/dL (calc) - - 99 - -  Triglycerides <150 mg/dL - - 78 - -  Creatinine 0.50 - 0.99 mg/dL - 0.97 1.26(H) 1.06(H) 0.99   BP/Weight 03/27/2018 02/19/2018 02/11/2018 12/10/2017 12/09/2017 11/01/2017 1/44/8185  Systolic BP 631 497 026 - 378 588 502  Diastolic BP 58 80 67 - 73 64 82  Wt. (Lbs) - 184 186 188  189.6 187 187  BMI 34.77 34.77 35.14 35.52 35.82 35.33 35.33   Foot/eye exam completion dates Latest Ref Rng & Units 02/19/2018 01/20/2018  Eye Exam No Retinopathy - Retinopathy(A)  Foot Form Completion - Done -        Vaginitis Recurrent vaginal itch in setting of uncontrolled diabetes, fluconazole prescribed but  advised of need for Gyne eval if persists

## 2018-03-29 NOTE — Assessment & Plan Note (Signed)
Commitment to regular use of allergy eye drop encouraged and same is prescribed

## 2018-05-01 ENCOUNTER — Encounter: Payer: Self-pay | Admitting: *Deleted

## 2018-05-01 ENCOUNTER — Telehealth: Payer: Self-pay | Admitting: *Deleted

## 2018-05-01 ENCOUNTER — Other Ambulatory Visit: Payer: Self-pay | Admitting: Family Medicine

## 2018-05-01 NOTE — Telephone Encounter (Signed)
LVM advising patient our office will be closed tomorrow due to bad weather, need to reschedule her appointment . Advised she call back before 12 noon today to reschedule.

## 2018-05-02 ENCOUNTER — Institutional Professional Consult (permissible substitution): Payer: Medicare Other | Admitting: Diagnostic Neuroimaging

## 2018-05-13 NOTE — Progress Notes (Signed)
Referring Provider: [name], MD Primary Care Physician:  [name], MD  Primary GI: Dr. [name]   Chief Complaint  Patient presents with  . Gastroesophageal Reflux    c/u burning in chest last night    HPI:   Michaela Peterson is a 63 y.o. female presenting today with a history of  low normal ferritin, low iron, Hgb 12 range historically. Colonoscopy/EGD in July 2015 and capsule unremarkable June 2016.Constipation, GERD chronic. History of gastroparesis. Mildly elevated transaminases in the past with plans to pursue ultrasound if continues to remain elevated; however, recent LFTs normal. Recent drop in Hgb to 10.7 from baseline. Ferritin 80, similar to a year ago. Iron low at 41. Recommending EGD when patient agreeable. At last visit noted epigastric/LUQ discomfort but declining EGD. Provided Dexilant samples at last visit.   Taking Dexilant daily. Drinks sodas at least once daily, sometimes drinks all day long. Helps her to "burp" and feel better. Rare Ibuprofen, around 6 in past few months. Abdominal pain is better, depending on food choices. Popcorn and tomato worsen pain. Sometimes suprapubic pain, has upcoming appt with GYN. Last night had some esophageal burning but notes she was stressed. Took 2 Tums, which helped.   BM not daily, some constipation. Doesn't feel like she is emptying completely. Not taking fiber. Has Miralax at home.  Due for surveillance colonoscopy July 2020 with history of polyps. Last in 2015. Continues to decline EGD due to finances. Willing to discuss again in July 2020.     Past Medical History:  Diagnosis Date  . Adnexal mass    Left; followed by Dr. [name]  . Anemia, iron deficiency   . Arthritis   . Chronic low back pain   . Diabetes mellitus, type 2 ([place])   . Essential hypertension   . Gastroparesis   . GERD (gastroesophageal reflux disease)   . MS (multiple sclerosis) ([place])   . Nonalcoholic fatty liver disease   .  Obesity   . Obesity, diabetes, and hypertension syndrome ([place]) 03/12/2009   Qualifier: Diagnosis of  By: Moshe Cipro MD, Joycelyn Schmid    . Sleep apnea 2009    Past Surgical History:  Procedure Laterality Date  . AGILE CAPSULE N/A 08/11/2014   Procedure: AGILE CAPSULE;  Surgeon: Daneil Dolin, MD;  Location: [facility];  Service: Endoscopy;  Laterality: N/A;  0700  . BLADDER SUSPENSION    . CESAREAN SECTION     x2   . CHOLECYSTECTOMY    . COLONOSCOPY  2010   Dr. [name]: tubular adenoma, few scattered diverticula  . COLONOSCOPY N/A 10/01/2013   Dr. Pixie Casino preparation. Normal rectum. Normal colonic mucosa  . DILATION AND CURETTAGE OF UTERUS     4  . ESOPHAGOGASTRODUODENOSCOPY  2010   Dr. [name]: normal esophagus, small hiatal hernia, questionable pale duodenal mucosa but negative for celiac sprue.   . ESOPHAGOGASTRODUODENOSCOPY N/A 10/01/2013   Dr. Rourk:gastric polyps-status post biopsy. Otherwise, normal EGD. fundic gland polyp, negative H.pylori  . GIVENS CAPSULE STUDY N/A 09/08/2014   Poor prep but overall unremarkable   . KNEE ARTHROSCOPY W/ DEBRIDEMENT    . LAPAROSCOPY ABDOMEN DIAGNOSTIC     lysis of adhesions  . ROTATOR CUFF REPAIR    . TENDON LENGTHENING     Left wrist  . UMBILICAL HERNIA REPAIR      Current Outpatient Medications  Medication Sig Dispense Refill  . allopurinol (ZYLOPRIM) 100 MG tablet TAKE 1 TABLET BY MOUTH  DAILY 90 tablet 1  . aspirin EC 81 MG tablet Take 81 mg by mouth daily.    . azelastine (ASTELIN) 0.1 % nasal spray Place 2 sprays into both nostrils 2 (two) times daily. Use in each nostril as directed 30 mL 3  . azelastine (OPTIVAR) 0.05 % ophthalmic solution Place 1 drop into both eyes 2 (two) times daily. 6 mL 12  . blood glucose meter kit and supplies Dispense based on patient and insurance preference. Use up to four times daily as directed. (FOR ICD-9 250.00, 250.01). 1 each 0  . cetirizine (ZYRTEC) 10 MG tablet TAKE 1 TABLET (10 MG TOTAL) BY MOUTH  DAILY. 30 tablet 5  . dexlansoprazole (DEXILANT) 60 MG capsule Take 60 mg by mouth daily.    . diphenhydrAMINE (BENADRYL) 25 MG tablet Take 25 mg by mouth every 6 (six) hours as needed for itching.    . glipiZIDE (GLUCOTROL) 10 MG tablet TAKE 1 TABLET BY MOUTH 2  TIMES DAILY BEFORE MEALS 180 tablet 3  . glucose blood (ONE TOUCH ULTRA TEST) test strip USE UP TO 4 TIMES DAILY AS  DIRECTED 400 each 1  . metFORMIN (GLUCOPHAGE) 1000 MG tablet TAKE 1 TABLET BY MOUTH TWO  TIMES DAILY WITH MEALS 180 tablet 1  . mometasone (NASONEX) 50 MCG/ACT nasal spray Place 2 sprays into the nose daily. 17 g 12  . montelukast (SINGULAIR) 10 MG tablet TAKE 1 TABLET BY MOUTH AT  BEDTIME 90 tablet 1  . NIFEdipine (ADALAT CC) 90 MG 24 hr tablet TAKE 1 TABLET BY MOUTH  DAILY 90 tablet 3  . ONETOUCH DELICA LANCETS 33G MISC USE UP TO 4 TIMES DAILY AS  DIRECTED 400 each 1  . pantoprazole (PROTONIX) 40 MG tablet TAKE 1 TABLET BY MOUTH 2  TIMES DAILY BEFORE MEALS 180 tablet 3  . Polyethyl Glycol-Propyl Glycol (SYSTANE) 0.4-0.3 % SOLN Apply 1 drop to eye daily.    . rosuvastatin (CRESTOR) 5 MG tablet One tablet three times per week, every Monday, Wednesday and Friday 36 tablet 3  . triamcinolone (NASACORT AQ) 55 MCG/ACT AERO nasal inhaler Place 2 sprays into the nose daily. 3 Inhaler 3  . triamterene-hydrochlorothiazide (MAXZIDE-25) 37.5-25 MG tablet TAKE 1 TABLET BY MOUTH  DAILY 90 tablet 1   No current facility-administered medications for this visit.     Allergies as of 05/14/2018 - Review Complete 05/14/2018  Allergen Reaction Noted  . Gabapentin Shortness Of Breath 02/13/2017  . Ace inhibitors Cough 03/26/2012  . Aspirin    . Oxycodone-acetaminophen Hives 02/21/2007  . Penicillins Hives   . Rebif [interferon beta-1a] Other (See Comments) 05/01/2018  . Statins  10/11/2012    Family History  Problem Relation Age of Onset  . Hypertension Sister   . Diabetes Sister   . Heart disease Sister   . Kidney failure  Sister   . Heart failure Mother   . Hypertension Mother   . Arthritis Mother   . Stroke Father   . Hypertension Son   . Hypertension Sister   . Hypertension Brother   . Mental illness Brother   . Colon cancer Maternal Aunt     Social History   Socioeconomic History  . Marital status: Married    Spouse name: Earl   . Number of children: 4  . Years of education: 13  . Highest education level: Not on file  Occupational History  . Occupation: unemployed     Employer: UNEMPLOYED  Social Needs  . Financial resource   strain: Somewhat hard  . Food insecurity:    Worry: Never true    Inability: Never true  . Transportation needs:    Medical: No    Non-medical: No  Tobacco Use  . Smoking status: Never Smoker  . Smokeless tobacco: Never Used  . Tobacco comment: patient lives with a smoker   Substance and Sexual Activity  . Alcohol use: No  . Drug use: No  . Sexual activity: Yes  Lifestyle  . Physical activity:    Days per week: 0 days    Minutes per session: 0 min  . Stress: Only a little  Relationships  . Social connections:    Talks on phone: More than three times a week    Gets together: Three times a week    Attends religious service: More than 4 times per year    Active member of club or organization: No    Attends meetings of clubs or organizations: Never    Relationship status: Married  Other Topics Concern  . Not on file  Social History Narrative   Patient lives at home with husband Earl.    Patient has 4 children.    Patient has a some college.    Patient is right handed.    Patient is currently not working.     Review of Systems: Gen: Denies fever, chills, anorexia. Denies fatigue, weakness, weight loss.  CV: Denies chest pain, palpitations, syncope, peripheral edema, and claudication. Resp: Denies dyspnea at rest, cough, wheezing, coughing up blood, and pleurisy. GI: see HPI Derm: Denies rash, itching, dry skin Psych: Denies depression, anxiety,  memory loss, confusion. No homicidal or suicidal ideation.  Heme: Denies bruising, bleeding, and enlarged lymph nodes.  Physical Exam: BP (!) 189/72   Pulse (!) 54   Temp (!) 97.1 F (36.2 C) (Oral)   Ht 5' 1" (1.549 m)   Wt 188 lb 12.8 oz (85.6 kg)   LMP 08/18/2012   BMI 35.67 kg/m  General:   Alert and oriented. No distress noted. Pleasant and cooperative.  Head:  Normocephalic and atraumatic. Eyes:  Conjuctiva clear without scleral icterus. Mouth:  Oral mucosa pink and moist.  Abdomen:  +BS, soft, non-tender and non-distended. No rebound or guarding. No HSM or masses noted. Msk:  Symmetrical without gross deformities. Normal posture. Extremities:  Without edema. Neurologic:  Alert and  oriented x4 Psych:  Alert and cooperative. Normal mood and affect.  

## 2018-05-14 ENCOUNTER — Ambulatory Visit: Payer: Medicare Other | Admitting: Gastroenterology

## 2018-05-14 ENCOUNTER — Encounter: Payer: Self-pay | Admitting: Gastroenterology

## 2018-05-14 VITALS — BP 189/72 | HR 54 | Temp 97.1°F | Ht 61.0 in | Wt 188.8 lb

## 2018-05-14 DIAGNOSIS — K5900 Constipation, unspecified: Secondary | ICD-10-CM

## 2018-05-14 DIAGNOSIS — K219 Gastro-esophageal reflux disease without esophagitis: Secondary | ICD-10-CM

## 2018-05-14 DIAGNOSIS — D649 Anemia, unspecified: Secondary | ICD-10-CM | POA: Diagnosis not present

## 2018-05-14 NOTE — Assessment & Plan Note (Signed)
For most part controlled with Dexilant daily. Discussed dietary/behavior modifications. Recommend avoidance of sodas.

## 2018-05-14 NOTE — Patient Instructions (Signed)
Please have blood work done when you go for your other blood work. I have provided orders for today. I am checking your blood count.  For constipation: take Benefiber or generic daily as noted on handout.   You may take Miralax daily if no bowel movement.  You are due for a colonoscopy in July 2020 and I recommend an EGD at that time if anemia worsens. We will discuss in July 2020 when you return!  I enjoyed seeing you again today! As you know, I value our relationship and want to provide genuine, compassionate, and quality care. I welcome your feedback. If you receive a survey regarding your visit,  I greatly appreciate you taking time to fill this out. See you next time!  Annitta Needs, PhD, ANP-BC [facility]

## 2018-05-14 NOTE — Assessment & Plan Note (Signed)
Recheck CBC, iron, ferritin, TIBC now. Declining EGD despite dyspepsia symptoms. Willing to consider in the summer at time of colonoscopy. No overt GI bleeding.

## 2018-05-14 NOTE — Progress Notes (Signed)
cc'ed to pcp °

## 2018-05-14 NOTE — Assessment & Plan Note (Signed)
Not controlled, noting unproductive bowel movements. Add Benefiber daily, take Miralax on any given day no BM. Due for surveillance colonoscopy in summer 2020. No alarm signs/symptoms. Wants to avoid prescription agents at this moment. Return in July 2020.

## 2018-05-15 ENCOUNTER — Ambulatory Visit: Payer: Self-pay | Admitting: Obstetrics and Gynecology

## 2018-05-15 DIAGNOSIS — E1149 Type 2 diabetes mellitus with other diabetic neurological complication: Secondary | ICD-10-CM | POA: Diagnosis not present

## 2018-05-15 DIAGNOSIS — I10 Essential (primary) hypertension: Secondary | ICD-10-CM | POA: Diagnosis not present

## 2018-05-15 DIAGNOSIS — D649 Anemia, unspecified: Secondary | ICD-10-CM | POA: Diagnosis not present

## 2018-05-15 DIAGNOSIS — E785 Hyperlipidemia, unspecified: Secondary | ICD-10-CM | POA: Diagnosis not present

## 2018-05-15 LAB — CBC WITH DIFFERENTIAL/PLATELET
Absolute Monocytes: 423 cells/uL (ref 200–950)
Basophils Absolute: 83 cells/uL (ref 0–200)
Basophils Relative: 1 %
Eosinophils Absolute: 50 cells/uL (ref 15–500)
Eosinophils Relative: 0.6 %
HCT: 33.5 % — ABNORMAL LOW (ref 35.0–45.0)
Hemoglobin: 11.3 g/dL — ABNORMAL LOW (ref 11.7–15.5)
Lymphs Abs: 3362 cells/uL (ref 850–3900)
MCH: 28 pg (ref 27.0–33.0)
MCHC: 33.7 g/dL (ref 32.0–36.0)
MCV: 83.1 fL (ref 80.0–100.0)
MPV: 10.3 fL (ref 7.5–12.5)
Monocytes Relative: 5.1 %
Neutro Abs: 4382 cells/uL (ref 1500–7800)
Neutrophils Relative %: 52.8 %
Platelets: 395 10*3/uL (ref 140–400)
RBC: 4.03 10*6/uL (ref 3.80–5.10)
RDW: 13.8 % (ref 11.0–15.0)
Total Lymphocyte: 40.5 %
WBC: 8.3 10*3/uL (ref 3.8–10.8)

## 2018-05-15 LAB — IRON, TOTAL/TOTAL IRON BINDING CAP
%SAT: 15 % (calc) — ABNORMAL LOW (ref 16–45)
Iron: 50 ug/dL (ref 45–160)
TIBC: 330 mcg/dL (calc) (ref 250–450)

## 2018-05-15 LAB — FERRITIN: Ferritin: 104 ng/mL (ref 16–288)

## 2018-05-16 LAB — COMPLETE METABOLIC PANEL WITH GFR
AG Ratio: 1.4 (calc) (ref 1.0–2.5)
ALT: 45 U/L — ABNORMAL HIGH (ref 6–29)
AST: 44 U/L — ABNORMAL HIGH (ref 10–35)
Albumin: 4.4 g/dL (ref 3.6–5.1)
Alkaline phosphatase (APISO): 86 U/L (ref 37–153)
BUN: 15 mg/dL (ref 7–25)
CO2: 26 mmol/L (ref 20–32)
Calcium: 10.3 mg/dL (ref 8.6–10.4)
Chloride: 106 mmol/L (ref 98–110)
Creat: 0.96 mg/dL (ref 0.50–0.99)
GFR, Est African American: 73 mL/min/{1.73_m2} (ref >=60)
GFR, Est Non African American: 63 mL/min/{1.73_m2} (ref >=60)
Globulin: 3.1 g/dL (calc) (ref 1.9–3.7)
Glucose, Bld: 150 mg/dL — ABNORMAL HIGH (ref 65–99)
Potassium: 4.5 mmol/L (ref 3.5–5.3)
Sodium: 140 mmol/L (ref 135–146)
Total Bilirubin: 0.5 mg/dL (ref 0.2–1.2)
Total Protein: 7.5 g/dL (ref 6.1–8.1)

## 2018-05-16 LAB — LIPID PANEL
Cholesterol: 169 mg/dL (ref <200)
HDL: 63 mg/dL (ref >=50)
LDL Cholesterol (Calc): 90 mg/dL (calc)
Non-HDL Cholesterol (Calc): 106 mg/dL (calc) (ref <130)
Total CHOL/HDL Ratio: 2.7 (calc) (ref <5.0)
Triglycerides: 69 mg/dL (ref <150)

## 2018-05-16 LAB — HEMOGLOBIN A1C
Hgb A1c MFr Bld: 9 % of total Hgb — ABNORMAL HIGH (ref <5.7)
Mean Plasma Glucose: 212 (calc)
eAG (mmol/L): 11.7 (calc)

## 2018-05-19 NOTE — Progress Notes (Signed)
Hgb improved from 3 months ago, along with iron and ferritin. Make sure she is taking iron twice a day. Will recheck when we see her again.

## 2018-05-21 ENCOUNTER — Ambulatory Visit: Payer: Medicare Other | Admitting: Diagnostic Neuroimaging

## 2018-05-21 ENCOUNTER — Encounter: Payer: Self-pay | Admitting: Diagnostic Neuroimaging

## 2018-05-21 ENCOUNTER — Other Ambulatory Visit: Payer: Self-pay

## 2018-05-21 ENCOUNTER — Encounter: Payer: Self-pay | Admitting: Family Medicine

## 2018-05-21 ENCOUNTER — Ambulatory Visit (INDEPENDENT_AMBULATORY_CARE_PROVIDER_SITE_OTHER): Payer: Medicare Other | Admitting: Family Medicine

## 2018-05-21 VITALS — BP 145/76 | HR 80 | Ht 61.0 in | Wt 188.6 lb

## 2018-05-21 VITALS — BP 130/78 | HR 70 | Resp 15 | Ht 61.0 in | Wt 188.0 lb

## 2018-05-21 DIAGNOSIS — E1169 Type 2 diabetes mellitus with other specified complication: Secondary | ICD-10-CM | POA: Diagnosis not present

## 2018-05-21 DIAGNOSIS — E785 Hyperlipidemia, unspecified: Secondary | ICD-10-CM

## 2018-05-21 DIAGNOSIS — G35 Multiple sclerosis: Secondary | ICD-10-CM | POA: Diagnosis not present

## 2018-05-21 DIAGNOSIS — I10 Essential (primary) hypertension: Secondary | ICD-10-CM

## 2018-05-21 DIAGNOSIS — E1149 Type 2 diabetes mellitus with other diabetic neurological complication: Secondary | ICD-10-CM

## 2018-05-21 DIAGNOSIS — J309 Allergic rhinitis, unspecified: Secondary | ICD-10-CM

## 2018-05-21 DIAGNOSIS — G44209 Tension-type headache, unspecified, not intractable: Secondary | ICD-10-CM

## 2018-05-21 DIAGNOSIS — R1013 Epigastric pain: Secondary | ICD-10-CM

## 2018-05-21 DIAGNOSIS — E669 Obesity, unspecified: Secondary | ICD-10-CM

## 2018-05-21 NOTE — Progress Notes (Signed)
[facility]  PATIENT: Michaela Peterson DOB: Dec 26, 1955  REFERRING CLINICIAN:  HISTORY FROM: patient  REASON FOR VISIT: follow up   HISTORICAL  CHIEF COMPLAINT:  Chief Complaint  Patient presents with   Multiple Sclerosis    rm 7, sisterDeneise Lever, "PCP wanted me to get check up, no new concerns related to [facility]"    HISTORY OF PRESENT ILLNESS:   UPDATE (05/21/18, VRP): Since last visit, here for evaluation of headaches.  Patient reports some sinus pressure, tension and pressure headache in November 2019.  She was diagnosed with sinus infection treated with antibiotics.  Symptoms improved.  Now patient having milder headaches intermittently.  Sometimes sensitive to light.  No nausea, sensitive to sound.  She feels that cleaning products seem to aggravate her headaches.  Patient has had similar type but less severe headaches as far back as 1990.    No progression of MS symptoms, numbness, weakness, balance problems.  PRIOR HPI (08/13/17, CM):  63 year old female returns for yearly followup.  She has a history of multiple sclerosis without significant clinical progression. Last MRI of the brain in 2011 did not show progression from previous, she is not interested in resuming any MS treatment even after discussing the importance of maintaining her current functional status.  She was diagnosed in 2004 on the basis of demyelinating plaques in the cervical spine and typical CSF findings. She was initially treated with Rebif but discontinued the drug due to her injection site reactions in April of 2005. She has not been on disease modifying treatment since that time, she reports some numbness and tingling in the feet which she feels has been stable for many years. She has bilateral knee pain and recently had injections. She has been told she needs total knee replacements. She is also a diabetic which is not in good control with most recent hemoglobin A1c over 7 .She also has  hypertension. She denies any double vision, dysarthria, dysphagia, bowel or bladder difficulties. She has not had any falls. She denies any balance issues. She is not walking for exercise. She has an intolerance to heat. She returns for reevaluation    REVIEW OF SYSTEMS: Full 14 system review of systems performed and negative with exception of: Snoring restless legs feeling hot aching muscles pain.   ALLERGIES: Allergies  Allergen Reactions   Gabapentin Shortness Of Breath   Ace Inhibitors Cough   Aspirin     REACTION: stomach upset with high doses   Oxycodone-Acetaminophen Hives   Penicillins Hives    Has patient had a PCN reaction causing immediate rash, facial/tongue/throat swelling, SOB or lightheadedness with hypotension: Yes Has patient had a PCN reaction causing severe rash involving mucus membranes or skin necrosis: No Has patient had a PCN reaction that required hospitalization: No Has patient had a PCN reaction occurring within the last 10 years: No If all of the above answers are "NO", then may proceed with Cephalosporin use.    Rebif [Interferon Beta-1a] Other (See Comments)    Injection site reactions, d/c med 06/2003   Statins     NAFLD    HOME MEDICATIONS: Outpatient Medications Prior to Visit  Medication Sig Dispense Refill   allopurinol (ZYLOPRIM) 100 MG tablet TAKE 1 TABLET BY MOUTH  DAILY 90 tablet 1   aspirin EC 81 MG tablet Take 81 mg by mouth daily.     azelastine (ASTELIN) 0.1 % nasal spray Place 2 sprays into both nostrils 2 (two) times daily. Use in each  nostril as directed 30 mL 3   azelastine (OPTIVAR) 0.05 % ophthalmic solution Place 1 drop into both eyes 2 (two) times daily. 6 mL 12   blood glucose meter kit and supplies Dispense based on patient and insurance preference. Use up to four times daily as directed. (FOR ICD-9 250.00, 250.01). 1 each 0   cetirizine (ZYRTEC) 10 MG tablet TAKE 1 TABLET (10 MG TOTAL) BY MOUTH DAILY. 30 tablet 5     dexlansoprazole (DEXILANT) 60 MG capsule Take 60 mg by mouth daily.     diphenhydrAMINE (BENADRYL) 25 MG tablet Take 25 mg by mouth every 6 (six) hours as needed for itching.     glipiZIDE (GLUCOTROL) 10 MG tablet TAKE 1 TABLET BY MOUTH 2  TIMES DAILY BEFORE MEALS 180 tablet 3   glucose blood (ONE TOUCH ULTRA TEST) test strip USE UP TO 4 TIMES DAILY AS  DIRECTED 400 each 1   metFORMIN (GLUCOPHAGE) 1000 MG tablet TAKE 1 TABLET BY MOUTH TWO  TIMES DAILY WITH MEALS 180 tablet 1   mometasone (NASONEX) 50 MCG/ACT nasal spray Place 2 sprays into the nose daily. 17 g 12   montelukast (SINGULAIR) 10 MG tablet TAKE 1 TABLET BY MOUTH AT  BEDTIME 90 tablet 1   NIFEdipine (ADALAT CC) 90 MG 24 hr tablet TAKE 1 TABLET BY MOUTH  DAILY 90 tablet 3   ONETOUCH DELICA LANCETS 77A MISC USE UP TO 4 TIMES DAILY AS  DIRECTED 400 each 1   pantoprazole (PROTONIX) 40 MG tablet TAKE 1 TABLET BY MOUTH 2  TIMES DAILY BEFORE MEALS 180 tablet 3   Polyethyl Glycol-Propyl Glycol (SYSTANE) 0.4-0.3 % SOLN Apply 1 drop to eye daily.     rosuvastatin (CRESTOR) 5 MG tablet One tablet three times per week, every Monday, Wednesday and Friday 36 tablet 3   triamcinolone (NASACORT AQ) 55 MCG/ACT AERO nasal inhaler Place 2 sprays into the nose daily. 3 Inhaler 3   triamterene-hydrochlorothiazide (MAXZIDE-25) 37.5-25 MG tablet TAKE 1 TABLET BY MOUTH  DAILY 90 tablet 1   No facility-administered medications prior to visit.     PAST MEDICAL HISTORY: Past Medical History:  Diagnosis Date   Adnexal mass    Left; followed by Dr. [name]   Anemia, iron deficiency    Arthritis    Chronic low back pain    Diabetes mellitus, type 2 ([place])    Essential hypertension    Gastroparesis    GERD (gastroesophageal reflux disease)    MS (multiple sclerosis) (HCC)    Nonalcoholic fatty liver disease    Obesity    Obesity, diabetes, and hypertension syndrome ([place]) 03/12/2009   Qualifier: Diagnosis of  By: Moshe Cipro MD,  Margaret     Sleep apnea 2009    PAST SURGICAL HISTORY: Past Surgical History:  Procedure Laterality Date   AGILE CAPSULE N/A 08/11/2014   Procedure: AGILE CAPSULE;  Surgeon: Daneil Dolin, MD;  [facility];  Service: Endoscopy;  Laterality: N/A;  0700   BLADDER SUSPENSION     CESAREAN SECTION     x2    CHOLECYSTECTOMY     COLONOSCOPY  2010   Dr. [name]: tubular adenoma, few scattered diverticula   COLONOSCOPY N/A 10/01/2013   Dr. Pixie Casino preparation. Normal rectum. Normal colonic mucosa   DILATION AND CURETTAGE OF UTERUS     4   ESOPHAGOGASTRODUODENOSCOPY  2010   Dr. [name]: normal esophagus, small hiatal hernia, questionable pale duodenal mucosa but negative for celiac sprue.  ESOPHAGOGASTRODUODENOSCOPY N/A 10/01/2013   Dr. Rourk:gastric polyps-status post biopsy. Otherwise, normal EGD. fundic gland polyp, negative H.pylori   GIVENS CAPSULE STUDY N/A 09/08/2014   Poor prep but overall unremarkable    KNEE ARTHROSCOPY W/ DEBRIDEMENT     LAPAROSCOPY ABDOMEN DIAGNOSTIC     lysis of adhesions   ROTATOR CUFF REPAIR     TENDON LENGTHENING     Left wrist   UMBILICAL HERNIA REPAIR      FAMILY HISTORY: Family History  Problem Relation Age of Onset   Hypertension Sister    Diabetes Sister    Heart disease Sister    Kidney failure Sister    Heart failure Mother    Hypertension Mother    Arthritis Mother    Stroke Father    Hypertension Son    Hypertension Sister    Hypertension Brother    Mental illness Brother    Colon cancer Maternal Aunt     SOCIAL HISTORY: Social History   Socioeconomic History   Marital status: Married    Spouse name: [name]    Number of children: 4   Years of education: 13   Highest education level: Not on file  Occupational History   Occupation: unemployed     Fish farm manager: UNEMPLOYED  Social Designer, fashion/clothing strain: Somewhat hard   Food insecurity:    Worry: Never true     Inability: Never true   Transportation needs:    Medical: No    Non-medical: No  Tobacco Use   Smoking status: Never Smoker   Smokeless tobacco: Never Used   Tobacco comment: patient lives with a smoker   Substance and Sexual Activity   Alcohol use: No   Drug use: No   Sexual activity: Yes  Lifestyle   Physical activity:    Days per week: 0 days    Minutes per session: 0 min   Stress: Only a little  Relationships   Social connections:    Talks on phone: More than three times a week    Gets together: Three times a week    Attends religious service: More than 4 times per year    Active member of club or organization: No    Attends meetings of clubs or organizations: Never    Relationship status: Married   Intimate partner violence:    Fear of current or ex partner: No    Emotionally abused: No    Physically abused: No    Forced sexual activity: No  Other Topics Concern   Not on file  Social History Narrative   Patient lives at home with husband [name].    Patient has 4 children.    Patient has a some college.    Patient is right handed.    Patient is currently not working.      PHYSICAL EXAM  GENERAL EXAM/CONSTITUTIONAL: Vitals:  Vitals:   05/21/18 1309  BP: (!) 145/76  Pulse: 80  Weight: 188 lb 9.6 oz (85.5 kg)  Height: 5' 1"  (1.549 m)     Body mass index is 35.64 kg/m. Wt Readings from Last 3 Encounters:  05/21/18 188 lb 9.6 oz (85.5 kg)  05/21/18 188 lb (85.3 kg)  05/14/18 188 lb 12.8 oz (85.6 kg)     Patient is in no distress; well developed, nourished and groomed; neck is supple  CARDIOVASCULAR:  Examination of carotid arteries is normal; no carotid bruits  Regular rate and rhythm, no murmurs  Examination of peripheral vascular system  by observation and palpation is normal  EYES:  Ophthalmoscopic exam of optic discs and posterior segments is normal; no papilledema or hemorrhages  Visual Acuity Screening   Right eye Left eye  Both eyes  Without correction: 20/30 20/30   With correction:        MUSCULOSKELETAL:  Gait, strength, tone, movements noted in Neurologic exam below  NEUROLOGIC: MENTAL STATUS:  No flowsheet data found.  awake, alert, oriented to person, place and time  recent and remote memory intact  normal attention and concentration  language fluent, comprehension intact, naming intact  fund of knowledge appropriate  CRANIAL NERVE:   2nd - no papilledema on fundoscopic exam  2nd, 3rd, 4th, 6th - pupils equal and reactive to light, visual fields full to confrontation, extraocular muscles intact, no nystagmus  5th - facial sensation symmetric  7th - facial strength symmetric  8th - hearing intact  9th - palate elevates symmetrically, uvula midline  11th - shoulder shrug symmetric  12th - tongue protrusion midline  MOTOR:   normal bulk and tone, full strength in the BUE, BLE  SENSORY:   normal and symmetric to light touch  COORDINATION:   finger-nose-finger, fine finger movements normal  REFLEXES:   deep tendon reflexes TRACE and symmetric  GAIT/STATION:   narrow based gait     DIAGNOSTIC DATA (LABS, IMAGING, TESTING) - I reviewed patient records, labs, notes, testing and imaging myself where available.  Lab Results  Component Value Date   WBC 8.3 05/15/2018   HGB 11.3 (L) 05/15/2018   HCT 33.5 (L) 05/15/2018   MCV 83.1 05/15/2018   PLT 395 05/15/2018      Component Value Date/Time   NA 140 05/15/2018 1026   K 4.5 05/15/2018 1026   CL 106 05/15/2018 1026   CO2 26 05/15/2018 1026   GLUCOSE 150 (H) 05/15/2018 1026   BUN 15 05/15/2018 1026   CREATININE 0.96 05/15/2018 1026   CALCIUM 10.3 05/15/2018 1026   PROT 7.5 05/15/2018 1026   ALBUMIN 4.3 12/19/2016 1259   AST 44 (H) 05/15/2018 1026   ALT 45 (H) 05/15/2018 1026   ALKPHOS 85 12/19/2016 1259   BILITOT 0.5 05/15/2018 1026   GFRNONAA 63 05/15/2018 1026   GFRAA 73 05/15/2018 1026   Lab  Results  Component Value Date   CHOL 169 05/15/2018   HDL 63 05/15/2018   LDLCALC 90 05/15/2018   TRIG 69 05/15/2018   CHOLHDL 2.7 05/15/2018   Lab Results  Component Value Date   HGBA1C 9.0 (H) 05/15/2018   Lab Results  Component Value Date   VITAMINB12 325 02/10/2018   Lab Results  Component Value Date   TSH 6.03 (H) 02/10/2018    04/13/09 MRI brain  - Examination precisely stable when compared to 07/02/2005. Scattered foci of abnormal white matter signal on both cerebral hemispheres consistent with the clinical diagnosis of multiple sclerosis.  No new or progressive disease.   - Mild Chiari malformation, unchanged.    ASSESSMENT AND PLAN  62 y.o. year old female here with:  Dx:  1. Tension headache   2. MULTIPLE SCLEROSIS     PLAN:  TENSION HEADACHES (? Some migraine features) - flare up of headaches in Nov 2019 during sinus infx symptoms; patient has had similar type of headaches going back to ~ 1990 - patient does not want additional medication or testing for these headaches  MULTIPLE SCLEROSIS - stable; patient declines any DMT or MRI monitoring  Return for return to  PCP, pending if symptoms worsen or fail to improve.    Penni Bombard, MD 3/69/2230, 0:97 PM Certified in Neurology, Neurophysiology and Neuroimaging  [facility] [address] [place], [place] [address] [phone]

## 2018-05-21 NOTE — Patient Instructions (Addendum)
F/U in 3 months, call if you need me before  You \ know what you want for yourself and what you need to do to get this, I v believe in you!   HBA1C, cmp  and EGFR and TSH,  June 6 or shortlly after, non fasting  It is important that you exercise regularly at least 30 minutes 5 times a week. If you develop chest pain, have severe difficulty breathing, or feel very tired, stop exercising immediately and seek medical attention  .Think about what you will eat, plan ahead. Choose " clean, green, fresh or frozen" over canned, processed or packaged foods which are more sugary, salty and fatty. 70 to 75% of food eaten should be vegetables and fruit. Three meals at set times with snacks allowed between meals, but they must be fruit or vegetables. Aim to eat over a 12 hour period , example 7 am to 7 pm, and STOP after  your last meal of the day. Drink water,generally about 64 ounces per day, no other drink is as healthy. Fruit juice is best enjoyed in a healthy way, by EATING the fruit.

## 2018-05-22 ENCOUNTER — Encounter: Payer: Self-pay | Admitting: Obstetrics and Gynecology

## 2018-05-22 ENCOUNTER — Ambulatory Visit: Payer: Medicare Other | Admitting: Obstetrics and Gynecology

## 2018-05-22 VITALS — BP 138/78 | HR 93 | Ht 61.0 in | Wt 187.2 lb

## 2018-05-22 DIAGNOSIS — N904 Leukoplakia of vulva: Secondary | ICD-10-CM | POA: Diagnosis not present

## 2018-05-22 DIAGNOSIS — N816 Rectocele: Secondary | ICD-10-CM | POA: Diagnosis not present

## 2018-05-22 DIAGNOSIS — Z6841 Body Mass Index (BMI) 40.0 and over, adult: Secondary | ICD-10-CM | POA: Diagnosis not present

## 2018-05-22 MED ORDER — CLOBETASOL PROPIONATE 0.05 % EX CREA: TOPICAL_CREAM | Freq: Two times a day (BID) | CUTANEOUS | Status: DC

## 2018-05-22 NOTE — Patient Instructions (Signed)

## 2018-05-22 NOTE — Progress Notes (Signed)
Patient ID: Michaela Peterson, female   DOB: 03/30/1955, 63 y.o.   MRN: [id number]    [facility] Visit  @DATE @            Patient name: Michaela Peterson MRN [id number]  Date of birth: [birth year]  CC & HPI:  Michaela Peterson is a 63 y.o. female presenting today for external itching that has been present for several years. It is worse on the right side. She has been treated for a yeast infection but it did not help. She used to take [place] but stopped a year ago.   She also has trouble with bowel movements and sometimes has to take a suppository. Pt is not physically active. She had one vaginal birth and two c sections. The patient denies spotting, vaginal discharge, fever, chills or any other symptoms or complaints at this time.   ROS:  ROS  + itching of external genitalia - spotting - vaginal discharge - fever - chills All systems are negative except as noted in the HPI and PMH.   Pertinent History Reviewed:   Reviewed:  Medical         Past Medical History:  Diagnosis Date  . Adnexal mass    Left; followed by Dr. [name]  . Anemia, iron deficiency   . Arthritis   . Chronic low back pain   . Diabetes mellitus, type 2 ([place])   . Essential hypertension   . Gastroparesis   . GERD (gastroesophageal reflux disease)   . MS (multiple sclerosis) ([place])   . Nonalcoholic fatty liver disease   . Obesity   . Obesity, diabetes, and hypertension syndrome ([place]) 03/12/2009   Qualifier: Diagnosis of  By: Moshe Cipro MD, Joycelyn Schmid    . Sleep apnea 2009                              Surgical Hx:    Past Surgical History:  Procedure Laterality Date  . AGILE CAPSULE N/A 08/11/2014   Procedure: AGILE CAPSULE;  Surgeon: Daneil Dolin, MD;  [facility];  Service: Endoscopy;  Laterality: N/A;  0700  . BLADDER SUSPENSION    . CESAREAN SECTION     x2   . CHOLECYSTECTOMY    . COLONOSCOPY  2010   Dr. [name]: tubular adenoma, few scattered diverticula  . COLONOSCOPY N/A 10/01/2013   Dr.  Pixie Casino preparation. Normal rectum. Normal colonic mucosa  . DILATION AND CURETTAGE OF UTERUS     4  . ESOPHAGOGASTRODUODENOSCOPY  2010   Dr. [name]: normal esophagus, small hiatal hernia, questionable pale duodenal mucosa but negative for celiac sprue.   . ESOPHAGOGASTRODUODENOSCOPY N/A 10/01/2013   Dr. Rourk:gastric polyps-status post biopsy. Otherwise, normal EGD. fundic gland polyp, negative H.pylori  . GIVENS CAPSULE STUDY N/A 09/08/2014   Poor prep but overall unremarkable   . KNEE ARTHROSCOPY W/ DEBRIDEMENT    . LAPAROSCOPY ABDOMEN DIAGNOSTIC     lysis of adhesions  . ROTATOR CUFF REPAIR    . TENDON LENGTHENING     Left wrist  . UMBILICAL HERNIA REPAIR     Medications: Reviewed & Updated - see associated section                       Current Outpatient Medications:  .  allopurinol (ZYLOPRIM) 100 MG tablet, TAKE 1 TABLET BY MOUTH  DAILY, Disp: 90 tablet, Rfl: 1 .  aspirin EC  81 MG tablet, Take 81 mg by mouth daily., Disp: , Rfl:  .  azelastine (ASTELIN) 0.1 % nasal spray, Place 2 sprays into both nostrils 2 (two) times daily. Use in each nostril as directed, Disp: 30 mL, Rfl: 3 .  azelastine (OPTIVAR) 0.05 % ophthalmic solution, Place 1 drop into both eyes 2 (two) times daily., Disp: 6 mL, Rfl: 12 .  blood glucose meter kit and supplies, Dispense based on patient and insurance preference. Use up to four times daily as directed. (FOR ICD-9 250.00, 250.01)., Disp: 1 each, Rfl: 0 .  cetirizine (ZYRTEC) 10 MG tablet, TAKE 1 TABLET (10 MG TOTAL) BY MOUTH DAILY., Disp: 30 tablet, Rfl: 5 .  dexlansoprazole (DEXILANT) 60 MG capsule, Take 60 mg by mouth daily., Disp: , Rfl:  .  diphenhydrAMINE (BENADRYL) 25 MG tablet, Take 25 mg by mouth every 6 (six) hours as needed for itching., Disp: , Rfl:  .  glipiZIDE (GLUCOTROL) 10 MG tablet, TAKE 1 TABLET BY MOUTH 2  TIMES DAILY BEFORE MEALS, Disp: 180 tablet, Rfl: 3 .  glucose blood (ONE TOUCH ULTRA TEST) test strip, USE UP TO 4 TIMES DAILY AS   DIRECTED, Disp: 400 each, Rfl: 1 .  metFORMIN (GLUCOPHAGE) 1000 MG tablet, TAKE 1 TABLET BY MOUTH TWO  TIMES DAILY WITH MEALS, Disp: 180 tablet, Rfl: 1 .  mometasone (NASONEX) 50 MCG/ACT nasal spray, Place 2 sprays into the nose daily., Disp: 17 g, Rfl: 12 .  montelukast (SINGULAIR) 10 MG tablet, TAKE 1 TABLET BY MOUTH AT  BEDTIME, Disp: 90 tablet, Rfl: 1 .  NIFEdipine (ADALAT CC) 90 MG 24 hr tablet, TAKE 1 TABLET BY MOUTH  DAILY, Disp: 90 tablet, Rfl: 3 .  ONETOUCH DELICA LANCETS 24M MISC, USE UP TO 4 TIMES DAILY AS  DIRECTED, Disp: 400 each, Rfl: 1 .  pantoprazole (PROTONIX) 40 MG tablet, TAKE 1 TABLET BY MOUTH 2  TIMES DAILY BEFORE MEALS, Disp: 180 tablet, Rfl: 3 .  Polyethyl Glycol-Propyl Glycol (SYSTANE) 0.4-0.3 % SOLN, Apply 1 drop to eye daily., Disp: , Rfl:  .  rosuvastatin (CRESTOR) 5 MG tablet, One tablet three times per week, every Monday, Wednesday and Friday, Disp: 36 tablet, Rfl: 3 .  triamcinolone (NASACORT AQ) 55 MCG/ACT AERO nasal inhaler, Place 2 sprays into the nose daily., Disp: 3 Inhaler, Rfl: 3 .  triamterene-hydrochlorothiazide (MAXZIDE-25) 37.5-25 MG tablet, TAKE 1 TABLET BY MOUTH  DAILY, Disp: 90 tablet, Rfl: 1  Current Facility-Administered Medications:  .  clobetasol cream (TEMOVATE) 0.05 %, , Topical, BID, [name], Angelyn Punt, MD  Social History: Reviewed -  reports that she has never smoked. She has never used smokeless tobacco.  Objective Findings:  Vitals: Blood pressure 138/78, pulse 93, height 5' 1"  (1.549 m), weight 187 lb 3.2 oz (84.9 kg), last menstrual period 08/18/2012.  PHYSICAL EXAMINATION General appearance - alert, well appearing, and in no distress, oriented to person, place, and time and overweight Mental status - alert, oriented to person, place, and time, normal mood, behavior, speech, dress, motor activity, and thought processes, affect appropriate to mood  PELVIC External genitalia - hyperkeratosis of labia majora. Normal labia minora. Uterus -  first degree uterine descensus  Rectal - rectocele   Discussion: 1. Discussed with pt benefits of weight loss and water exercises At end of discussion, pt had opportunity to ask questions and has no further questions at this time.  Specific discussion of weight loss as noted above.  Total time greater than:25  minutes.  Assessment & Plan:   A:  1. Chronic Moisture 2. Vulvar Dystrophy 3. Rectocele 4. Weight Management  P:  1. Rx Clobetasol 2. F/U in 4 wks  By signing my name below, I, De Burrs, attest that this documentation has been prepared under the direction and in the presence of Jonnie Kind, MD. Electronically Signed: De Burrs, Medical Scribe. 05/22/18. 11:21 AM.  I personally performed the services described in this documentation, which was SCRIBED in my presence. The recorded information has been reviewed and considered accurate. It has been edited as necessary during review. Jonnie Kind, MD

## 2018-05-23 ENCOUNTER — Encounter: Payer: Self-pay | Admitting: Family Medicine

## 2018-05-23 IMAGING — MR MR CERVICAL SPINE W/O CM
4 of 6 series · 18 of 48 positions shown · non-contrast
Comparison: None.

CLINICAL DATA: Left shoulder pain for 3 months

EXAM:
MRI CERVICAL SPINE WITHOUT CONTRAST
TECHNIQUE: Multiplanar, multisequence MR imaging of the cervical spine was
performed. No intravenous contrast was administered.

[Series 3: T2 · sagittal · 3.0mm · 0.36mm/px · 5 of 13 slices shown (1 of 3)]
[im 1/13]
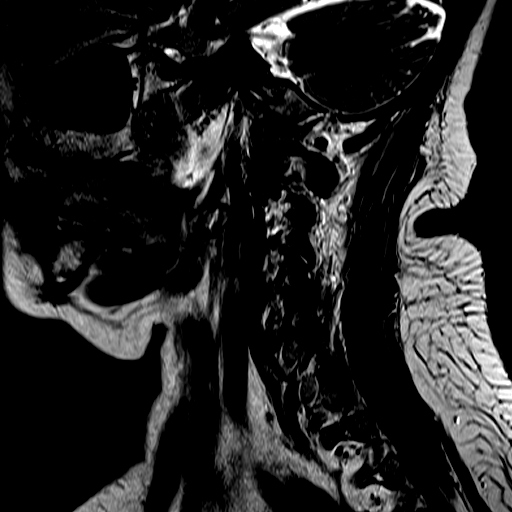
[im 4/13]
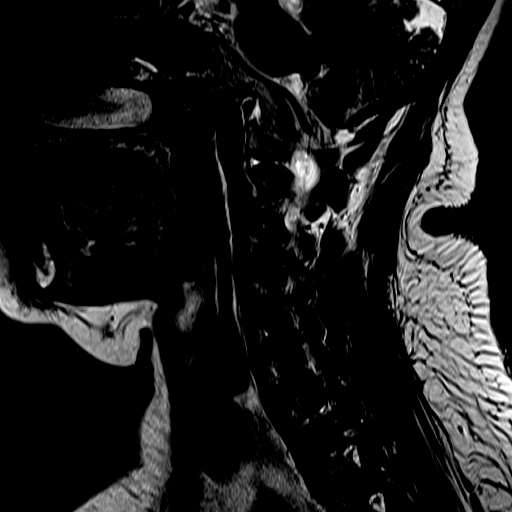
[im 7/13]
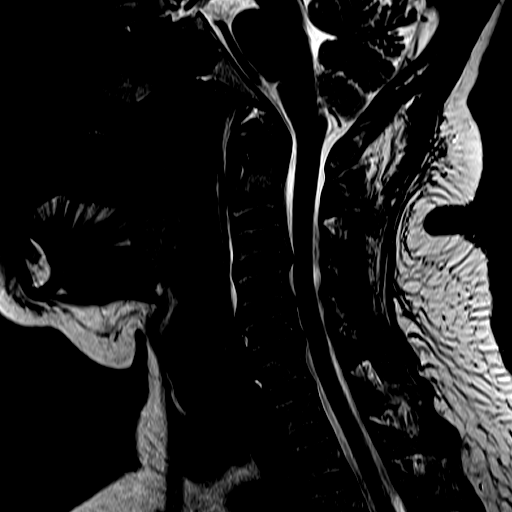
[im 10/13]
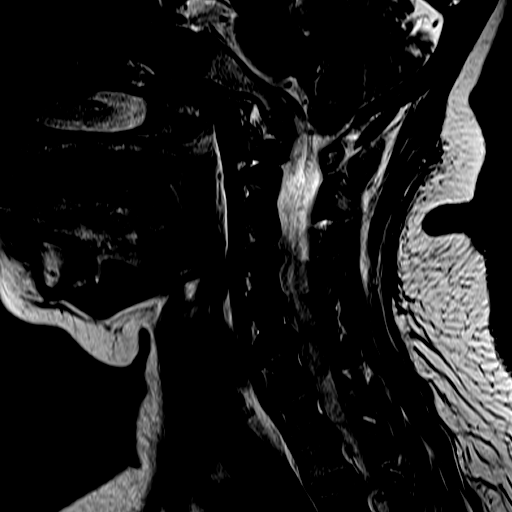
[im 13/13]
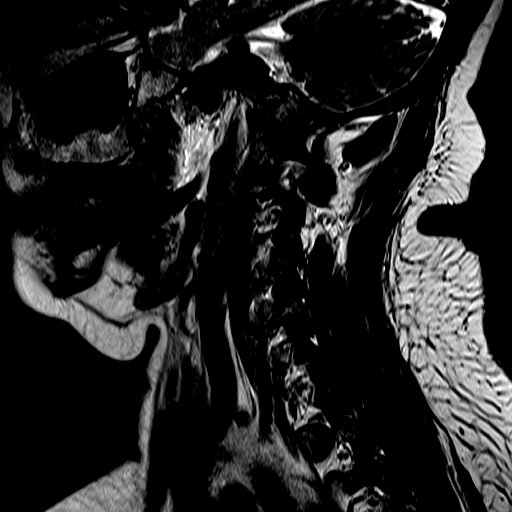

[Series 4: FLAIR · sagittal · 3.0mm · 0.41mm/px · 3 of 13 slices shown]
[im 1/13]
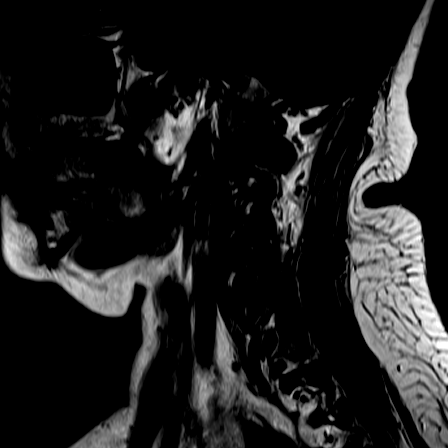
[im 7/13]
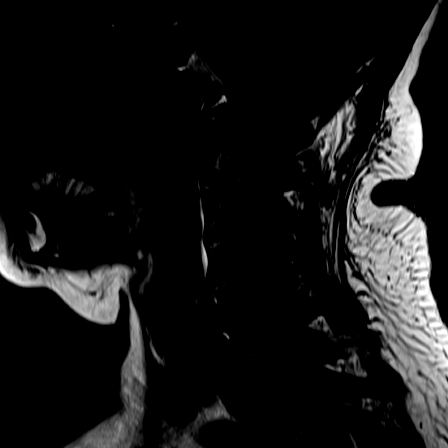
[im 13/13]
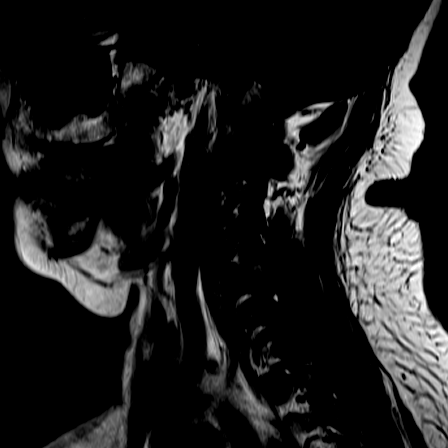

[Series 7: T2 · axial · 3.0mm · 0.19mm/px · z∈[-77,+1]mm · 7 of 30 slices shown (2 of 3)]
[im 1/30]
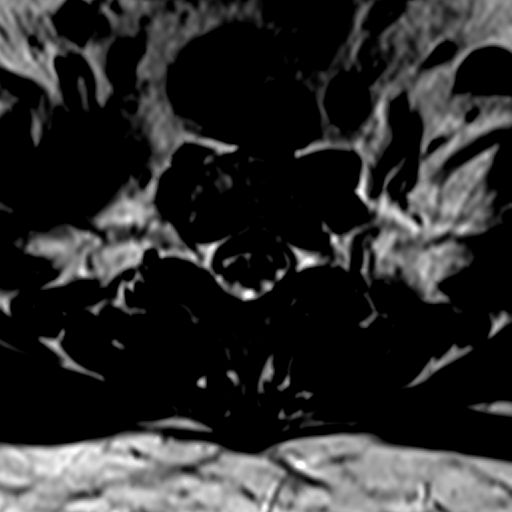
[im 5/30]
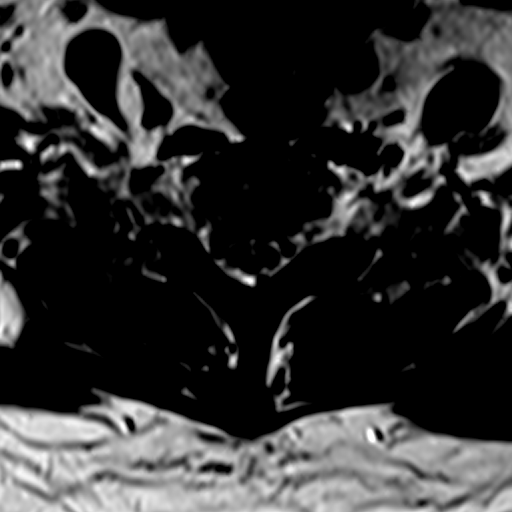
[im 10/30]
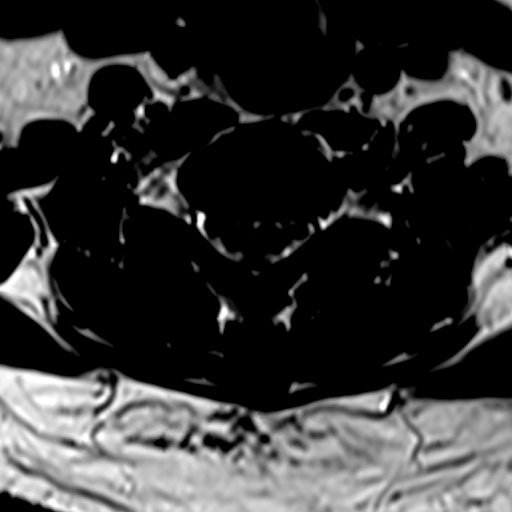
[im 13/30]
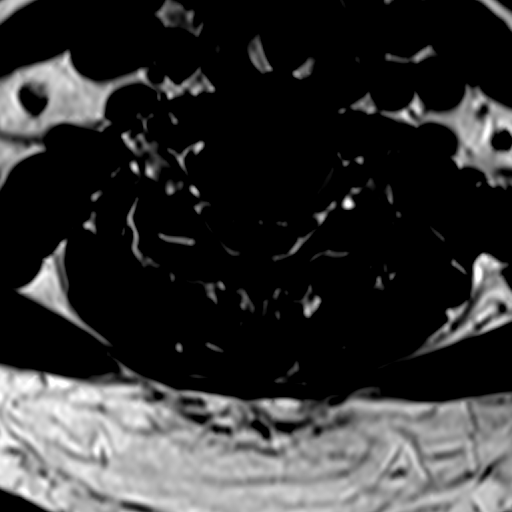
[im 15/30]
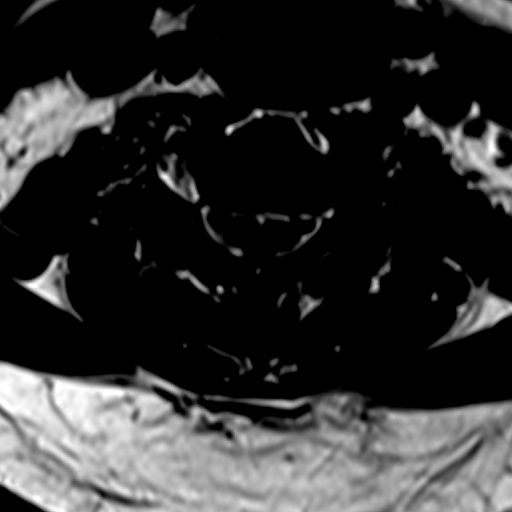
[im 17/30]
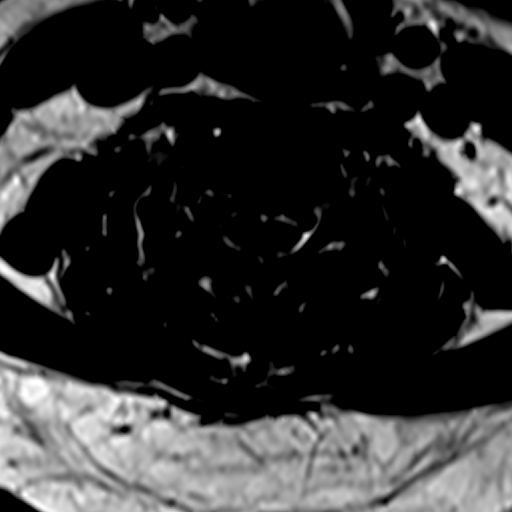
[im 25/30]
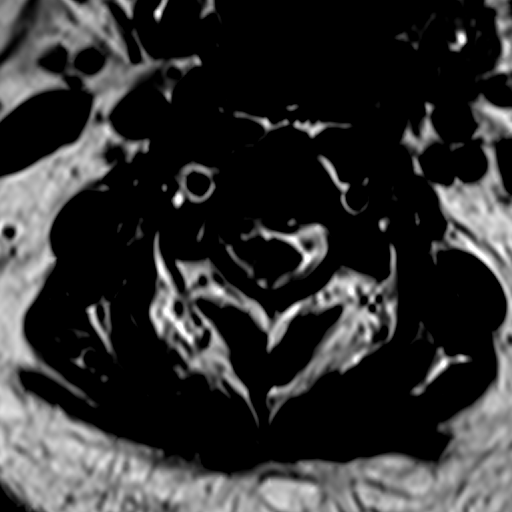

[Series 8: T2 · sagittal · 3.0mm · 0.29mm/px · 3 of 13 slices shown (3 of 3)]
[im 3/13]
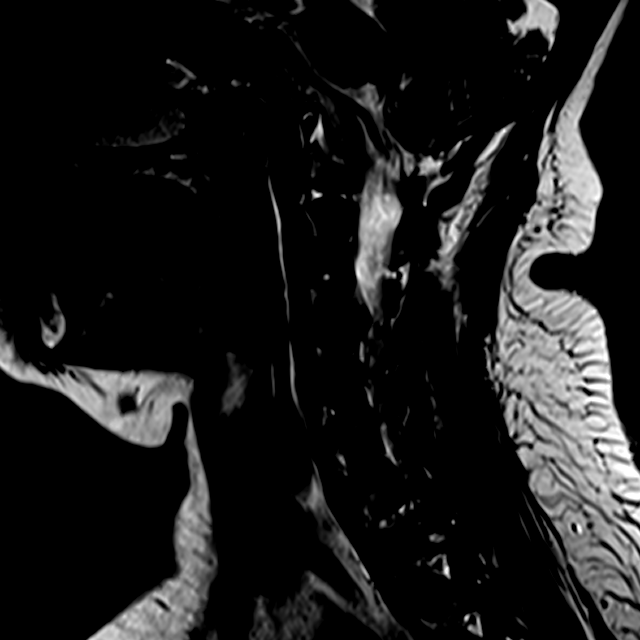
[im 8/13]
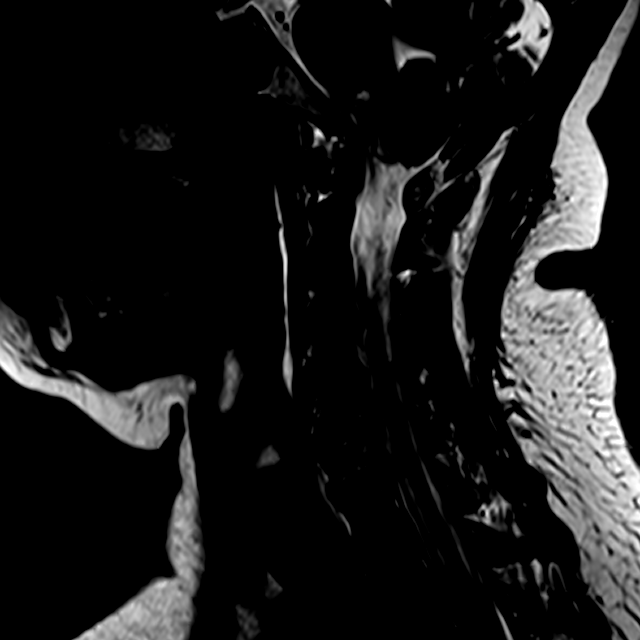
[im 13/13]
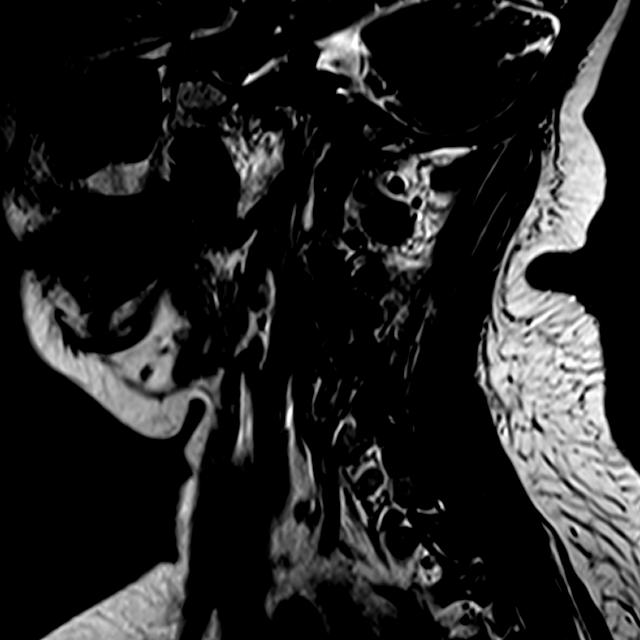

[18 of 48 positions shown; findings below may reference images not displayed]

FINDINGS: Alignment: Normal

Vertebrae: No fracture, evidence of discitis, or bone lesion.

Cord: Normal signal and morphology.

Posterior Fossa, vertebral arteries, paraspinal tissues: Negative.

Disc levels:

C1-C2: Normal.

C2-C3: Normal disc space and facets. No spinal canal or
neuroforaminal stenosis.

C3-C4: Left subarticular disc protrusion narrows the ventral thecal
sac. No central spinal canal stenosis. Mild left neural foraminal
stenosis.

C4-C5: Small disc bulge and bilateral mild uncovertebral spurring.
No spinal canal stenosis. Mild left neural foraminal stenosis.

C5-C6: Small disc osteophyte complex narrows the ventral thecal sac
and causes moderate left neural foraminal stenosis. No central
spinal canal stenosis.

C6-C7: Normal disc space and facets. No spinal canal or
neuroforaminal stenosis.

C7-T1: Normal disc space and facets. No spinal canal or
neuroforaminal stenosis.
IMPRESSION: 1. Moderate left C5-6 neural foraminal stenosis and mild left C3-4
and C4-5 foraminal stenosis.
2. No central spinal canal stenosis.

## 2018-05-23 NOTE — Assessment & Plan Note (Signed)
Controlled, no change in medication  

## 2018-05-23 NOTE — Progress Notes (Signed)
Michaela Peterson     MRN: [id number]      DOB: 25-Oct-1955   HPI Michaela Peterson is here for follow up and re-evaluation of chronic medical conditions, medication management and review of any available recent lab and radiology data.  Preventive health is updated, specifically  Cancer screening and Immunization.   Plans to see Gyne tomorrow re recurrent/ chronic vaginal itching The PT denies any adverse reactions to current medications since the last visit.  Has not been testing and reports non compliance in terms of diet and medication , reports excessive personal stress and her "bad behavior", will change that  As she wants to improve her health C/o intermittent sinus pressure and nasal  Congestion, drainage is clear, no fever or chills  ROS Denies recent fever or chills. Denies  ear pain or sore throat. Denies chest congestion, productive cough or wheezing. Denies chest pains, palpitations and leg swelling Denies abdominal pain, nausea, vomiting,diarrhea or constipation.   Denies dysuria, frequency, hesitancy or incontinence. Denies joint pain, swelling and limitation in mobility. Denies headaches, seizures, numbness, or tingling. Denies skin break down or rash.   PE  BP 130/78   Pulse 70   Resp 15   Ht 5\' 1"  (1.549 m)   Wt 188 lb (85.3 kg)   LMP 08/18/2012   SpO2 98%   BMI 35.52 kg/m   Patient alert and oriented and in no cardiopulmonary distress.  HEENT: No facial asymmetry, EOMI,   oropharynx pink and moist.  Neck supple no JVD, no mass.Nasal mucosa erythematous and edematous, no sinus tenderness. TM clear bilaterally  Chest: Clear to auscultation bilaterally.  CVS: S1, S2 no murmurs, no S3.Regular rate.  ABD: Soft non tender.   Ext: No edema  MS: Adequate ROM spine, shoulders, hips and knees.  Skin: Intact, no ulcerations or rash noted.  Psych: Good eye contact, normal affect. Memory intact not anxious or depressed appearing.  CNS: CN 2-12 intact, power,  normal  throughout.no focal deficits noted.   Assessment & Plan  Type 2 diabetes mellitus with neurological complications (HCC) Deteriorated and uncontrolled Needs to be compliant with medication and diet Michaela Peterson is reminded of the importance of commitment to daily physical activity for 30 minutes or more, as able and the need to limit carbohydrate intake to 30 to 60 grams per meal to help with blood sugar control.   The need to take medication as prescribed, test blood sugar as directed, and to call between visits if there is a concern that blood sugar is uncontrolled is also discussed.   Michaela Peterson is reminded of the importance of daily foot exam, annual eye examination, and good blood sugar, blood pressure and cholesterol control.  Diabetic Labs Latest Ref Rng & Units 05/15/2018 02/19/2018 02/10/2018 10/16/2017 05/20/2017  HbA1c <5.7 % of total Hgb 9.0(H) - 8.0(H) 9.8(H) 7.6(H)  Microalbumin Not Estab. ug/mL - 60.7(H) - - -  Micro/Creat Ratio 0.0 - 30.0 mg/g creat - 25.3 - - -  Chol <200 mg/dL 169 - - 179 -  HDL > OR = 50 mg/dL 63 - - 63 -  Calc LDL mg/dL (calc) 90 - - 99 -  Triglycerides <150 mg/dL 69 - - 78 -  Creatinine 0.50 - 0.99 mg/dL 0.96 - 0.97 1.26(H) 1.06(H)   BP/Weight 05/22/2018 05/21/2018 05/21/2018 05/14/2018 03/27/2018 02/19/2018 50/0/9381  Systolic BP 829 937 169 678 938 101 751  Diastolic BP 78 76 78 72 58 80 67  Wt. (Lbs) 187.2 188.6  188 188.8 - 184 186  BMI 35.37 35.64 35.52 35.67 34.77 34.77 35.14   Foot/eye exam completion dates Latest Ref Rng & Units 02/19/2018 01/20/2018  Eye Exam No Retinopathy - Retinopathy(A)  Foot Form Completion - Done -        Hypertension goal BP (blood pressure) < 140/90 Controlled, no change in medication DASH diet and commitment to daily physical activity for a minimum of 30 minutes discussed and encouraged, as a part of hypertension management. The importance of attaining a healthy weight is also discussed.  BP/Weight 05/22/2018 05/21/2018  05/21/2018 05/14/2018 03/27/2018 02/19/2018 38/06/6657  Systolic BP 935 701 779 390 300 923 300  Diastolic BP 78 76 78 72 58 80 67  Wt. (Lbs) 187.2 188.6 188 188.8 - 184 186  BMI 35.37 35.64 35.52 35.67 34.77 34.77 35.14       Hyperlipidemia LDL goal <100 Hyperlipidemia:Low fat diet discussed and encouraged.   Lipid Panel  Lab Results  Component Value Date   CHOL 169 05/15/2018   HDL 63 05/15/2018   LDLCALC 90 05/15/2018   TRIG 69 05/15/2018   CHOLHDL 2.7 05/15/2018   Controlled, no change in medication     Dyspepsia Controlled, no change in medication   Allergic rhinitis Increased and uncontrolled symptoms, medications to be added to current regime and pt to use regulalrly  Morbid obesity ([place]) Obesity linked with hypertension and diabetes Unchanged  Patient re-educated about  the importance of commitment to a  minimum of 150 minutes of exercise per week as able.  The importance of healthy food choices with portion control discussed, as well as eating regularly and within a 12 hour window most days. The need to choose "clean , green" food 50 to 75% of the time is discussed, as well as to make water the primary drink and set a goal of 64 ounces water daily.  Encouraged to start a food diary,  and to consider  joining a support group. Sample diet sheets offered. Goals set by the patient for the next several months.   Weight /BMI 05/22/2018 05/21/2018 05/21/2018  WEIGHT 187 lb 3.2 oz 188 lb 9.6 oz 188 lb  HEIGHT 5\' 1"  5\' 1"  5\' 1"   BMI 35.37 kg/m2 35.64 kg/m2 35.52 kg/m2

## 2018-05-23 NOTE — Assessment & Plan Note (Signed)
Obesity linked with hypertension and diabetes Unchanged  Patient re-educated about  the importance of commitment to a  minimum of 150 minutes of exercise per week as able.  The importance of healthy food choices with portion control discussed, as well as eating regularly and within a 12 hour window most days. The need to choose "clean , green" food 50 to 75% of the time is discussed, as well as to make water the primary drink and set a goal of 64 ounces water daily.  Encouraged to start a food diary,  and to consider  joining a support group. Sample diet sheets offered. Goals set by the patient for the next several months.   Weight /BMI 05/22/2018 05/21/2018 05/21/2018  WEIGHT 187 lb 3.2 oz 188 lb 9.6 oz 188 lb  HEIGHT 5\' 1"  5\' 1"  5\' 1"   BMI 35.37 kg/m2 35.64 kg/m2 35.52 kg/m2

## 2018-05-23 NOTE — Assessment & Plan Note (Signed)
Hyperlipidemia:Low fat diet discussed and encouraged.   Lipid Panel  Lab Results  Component Value Date   CHOL 169 05/15/2018   HDL 63 05/15/2018   LDLCALC 90 05/15/2018   TRIG 69 05/15/2018   CHOLHDL 2.7 05/15/2018   Controlled, no change in medication    

## 2018-05-23 NOTE — Assessment & Plan Note (Signed)
Increased and uncontrolled symptoms, medications to be added to current regime and pt to use regulalrly

## 2018-05-23 NOTE — Assessment & Plan Note (Signed)
Controlled, no change in medication DASH diet and commitment to daily physical activity for a minimum of 30 minutes discussed and encouraged, as a part of hypertension management. The importance of attaining a healthy weight is also discussed.  BP/Weight 05/22/2018 05/21/2018 05/21/2018 05/14/2018 03/27/2018 02/19/2018 79/06/3274  Systolic BP 147 092 957 473 403 709 643  Diastolic BP 78 76 78 72 58 80 67  Wt. (Lbs) 187.2 188.6 188 188.8 - 184 186  BMI 35.37 35.64 35.52 35.67 34.77 34.77 35.14

## 2018-05-23 NOTE — Assessment & Plan Note (Signed)
Deteriorated and uncontrolled Needs to be compliant with medication and diet Ms. Orchard is reminded of the importance of commitment to daily physical activity for 30 minutes or more, as able and the need to limit carbohydrate intake to 30 to 60 grams per meal to help with blood sugar control.   The need to take medication as prescribed, test blood sugar as directed, and to call between visits if there is a concern that blood sugar is uncontrolled is also discussed.   Ms. Kegel is reminded of the importance of daily foot exam, annual eye examination, and good blood sugar, blood pressure and cholesterol control.  Diabetic Labs Latest Ref Rng & Units 05/15/2018 02/19/2018 02/10/2018 10/16/2017 05/20/2017  HbA1c <5.7 % of total Hgb 9.0(H) - 8.0(H) 9.8(H) 7.6(H)  Microalbumin Not Estab. ug/mL - 60.7(H) - - -  Micro/Creat Ratio 0.0 - 30.0 mg/g creat - 25.3 - - -  Chol <200 mg/dL 169 - - 179 -  HDL > OR = 50 mg/dL 63 - - 63 -  Calc LDL mg/dL (calc) 90 - - 99 -  Triglycerides <150 mg/dL 69 - - 78 -  Creatinine 0.50 - 0.99 mg/dL 0.96 - 0.97 1.26(H) 1.06(H)   BP/Weight 05/22/2018 05/21/2018 05/21/2018 05/14/2018 03/27/2018 02/19/2018 76/04/2631  Systolic BP 354 562 563 893 734 287 681  Diastolic BP 78 76 78 72 58 80 67  Wt. (Lbs) 187.2 188.6 188 188.8 - 184 186  BMI 35.37 35.64 35.52 35.67 34.77 34.77 35.14   Foot/eye exam completion dates Latest Ref Rng & Units 02/19/2018 01/20/2018  Eye Exam No Retinopathy - Retinopathy(A)  Foot Form Completion - Done -

## 2018-05-26 ENCOUNTER — Telehealth: Payer: Self-pay | Admitting: Obstetrics and Gynecology

## 2018-05-26 MED ORDER — CLOBETASOL PROPIONATE 0.05 % EX CREA: 1.0000 "application " | TOPICAL_CREAM | CUTANEOUS | 0 refills | Status: DC

## 2018-05-26 NOTE — Addendum Note (Signed)
Addended by: Christiana Pellant A on: 05/26/2018 02:34 PM   Modules accepted: Orders

## 2018-05-26 NOTE — Telephone Encounter (Signed)
Patient called stating that she would like call back from Dr. Trudie Buckler nurse. Pt states that she was suppose to have Dr. [name] sent in a prescription for her on 05/22/2018 but her pharmacy states they never received it. Please contact pt

## 2018-05-28 ENCOUNTER — Telehealth: Payer: Self-pay | Admitting: Obstetrics and Gynecology

## 2018-05-28 MED ORDER — TRIAMCINOLONE ACETONIDE 0.1 % EX CREA: 1.0000 "application " | TOPICAL_CREAM | Freq: Two times a day (BID) | CUTANEOUS | 3 refills | Status: DC

## 2018-05-28 NOTE — Telephone Encounter (Signed)
Patient called, stated that she would like [place] to call something else in because the Clobetasol Cream is too expensive.  She would like Triamcinolone.  [facility]  [phone]

## 2018-05-28 NOTE — Telephone Encounter (Signed)
Will RX kenalog

## 2018-06-04 DIAGNOSIS — G4733 Obstructive sleep apnea (adult) (pediatric): Secondary | ICD-10-CM | POA: Diagnosis not present

## 2018-06-18 ENCOUNTER — Telehealth: Payer: Self-pay | Admitting: *Deleted

## 2018-06-18 NOTE — Telephone Encounter (Signed)
Called patient to see if we could move her appt tomorrow. No answer left message to call us back.

## 2018-06-19 ENCOUNTER — Ambulatory Visit: Payer: Medicare Other | Admitting: Obstetrics and Gynecology

## 2018-08-18 ENCOUNTER — Ambulatory Visit: Payer: Medicare Other | Admitting: Obstetrics and Gynecology

## 2018-08-19 DIAGNOSIS — G4733 Obstructive sleep apnea (adult) (pediatric): Secondary | ICD-10-CM | POA: Diagnosis not present

## 2018-08-21 ENCOUNTER — Other Ambulatory Visit: Payer: Self-pay

## 2018-08-21 ENCOUNTER — Ambulatory Visit (INDEPENDENT_AMBULATORY_CARE_PROVIDER_SITE_OTHER): Payer: Medicare Other | Admitting: Family Medicine

## 2018-08-21 ENCOUNTER — Encounter (INDEPENDENT_AMBULATORY_CARE_PROVIDER_SITE_OTHER): Payer: Self-pay

## 2018-08-21 ENCOUNTER — Encounter: Payer: Self-pay | Admitting: Family Medicine

## 2018-08-21 VITALS — BP 138/78 | Ht 61.0 in | Wt 187.0 lb

## 2018-08-21 DIAGNOSIS — Z1231 Encounter for screening mammogram for malignant neoplasm of breast: Secondary | ICD-10-CM | POA: Diagnosis not present

## 2018-08-21 DIAGNOSIS — G8929 Other chronic pain: Secondary | ICD-10-CM

## 2018-08-21 DIAGNOSIS — I10 Essential (primary) hypertension: Secondary | ICD-10-CM

## 2018-08-21 DIAGNOSIS — E1149 Type 2 diabetes mellitus with other diabetic neurological complication: Secondary | ICD-10-CM

## 2018-08-21 DIAGNOSIS — G4733 Obstructive sleep apnea (adult) (pediatric): Secondary | ICD-10-CM

## 2018-08-21 DIAGNOSIS — M25561 Pain in right knee: Secondary | ICD-10-CM | POA: Insufficient documentation

## 2018-08-21 DIAGNOSIS — G35 Multiple sclerosis: Secondary | ICD-10-CM

## 2018-08-21 DIAGNOSIS — E785 Hyperlipidemia, unspecified: Secondary | ICD-10-CM

## 2018-08-21 DIAGNOSIS — J309 Allergic rhinitis, unspecified: Secondary | ICD-10-CM

## 2018-08-21 MED ORDER — CLOBETASOL PROPIONATE 0.05 % EX CREA: 1.0000 "application " | TOPICAL_CREAM | CUTANEOUS | 0 refills | Status: DC

## 2018-08-21 NOTE — Progress Notes (Signed)
Virtual Visit via Telephone Note  I connected with Michaela Peterson on 08/21/18 at  9:20 AM EDT by telephone and verified that I am speaking with the correct person using two identifiers.  Location: Patient: home Provider: office   I discussed the limitations, risks, security and privacy concerns of performing an evaluation and management service by telephone and the availability of in person appointments. I also discussed with the patient that there may be a patient responsible charge related to this service. The patient expressed understanding and agreed to proceed. This visit type is conducted due to national recommendations for restrictions regarding the COVID -19 Pandemic. Due to the patient's age and / or co morbidities, this format is felt to be most appropriate at this time without adequate follow up. The patient has no access to video technology/ had technical difficulties with video, requiring transitioning to audio format  only ( telephone ). All issues noted this document were discussed and addressed,no physical exam can be performed in this format.    History of Present Illness:   Review of Systems  Constitutional: Negative.   HENT: Negative.   Respiratory: Negative.   Cardiovascular: Positive for palpitations. Negative for chest pain, orthopnea, claudication, leg swelling and PND.  Gastrointestinal: Positive for heartburn.  Genitourinary: Positive for frequency. Negative for dysuria, flank pain, hematuria and urgency.  Musculoskeletal: Positive for joint pain.  Skin: Positive for itching.  Neurological: Positive for headaches.  Psychiatric/Behavioral: Negative.   Fell on sidewalk May 14, increased localized right knee pain, has chronic pain and states leg  Twisted, pain is improving but still  Present, wants to hold on Ortho eval  Persistent vaginal itching has gyne f/u Not taking medication regularly and not testing blood sugar on a regular basis States too busy looking  after everyone else Observations/Objective: BP 138/78   Ht 5\' 1"  (1.549 m)   Wt 187 lb (84.8 kg)   LMP 08/18/2012   BMI 35.33 kg/m   Good communication with no confusion and intact memory. Alert and oriented x 3 No signs of respiratory distress during sppech        Assessment and Plan: Right knee pain Increased pain s/p fall, has chronic knee pain otherwise X ray of right knee  Hypertension goal BP (blood pressure) < 140/90 Controlled, no change in medication DASH diet and commitment to daily physical activity for a minimum of 30 minutes discussed and encouraged, as a part of hypertension management. The importance of attaining a healthy weight is also discussed.  BP/Weight 08/21/2018 05/22/2018 05/21/2018 05/21/2018 05/14/2018 03/27/2018 52/48/1859  Systolic BP 093 112 162 446 950 722 575  Diastolic BP 78 78 76 78 72 58 80  Wt. (Lbs) 187 187.2 188.6 188 188.8 - 184  BMI 35.33 35.37 35.64 35.52 35.67 34.77 34.77       Hyperlipidemia LDL goal <100 Hyperlipidemia:Low fat diet discussed and encouraged.   Lipid Panel  Lab Results  Component Value Date   CHOL 169 05/15/2018   HDL 63 05/15/2018   LDLCALC 90 05/15/2018   TRIG 69 05/15/2018   CHOLHDL 2.7 05/15/2018   Controlled, no change in medication     OSA (obstructive sleep apnea) Recently started new machine with good success , encouraged her to use daily  Allergic rhinitis Reports recent exposure to weed killer, r with increased allergy symptoms, she has severe uncontrolled allergies esp to chemical exposure, I reminded her of this and asked she not expose herself  Type 2 diabetes mellitus with  neurological complications ([place]) Uncontrolled, re education done re need to comply and address consisitently Updated lab needed at/ before next visit. Michaela Peterson is reminded of the importance of commitment to daily physical activity for 30 minutes or more, as able and the need to limit carbohydrate intake to 30 to 60  grams per meal to help with blood sugar control.   The need to take medication as prescribed, test blood sugar as directed, and to call between visits if there is a concern that blood sugar is uncontrolled is also discussed.   Michaela Peterson is reminded of the importance of daily foot exam, annual eye examination, and good blood sugar, blood pressure and cholesterol control.  Diabetic Labs Latest Ref Rng & Units 05/15/2018 02/19/2018 02/10/2018 10/16/2017 05/20/2017  HbA1c <5.7 % of total Hgb 9.0(H) - 8.0(H) 9.8(H) 7.6(H)  Microalbumin Not Estab. ug/mL - 60.7(H) - - -  Micro/Creat Ratio 0.0 - 30.0 mg/g creat - 25.3 - - -  Chol <200 mg/dL 169 - - 179 -  HDL > OR = 50 mg/dL 63 - - 63 -  Calc LDL mg/dL (calc) 90 - - 99 -  Triglycerides <150 mg/dL 69 - - 78 -  Creatinine 0.50 - 0.99 mg/dL 0.96 - 0.97 1.26(H) 1.06(H)   BP/Weight 08/21/2018 05/22/2018 05/21/2018 05/21/2018 05/14/2018 03/27/2018 57/26/2035  Systolic BP 597 416 384 536 468 032 122  Diastolic BP 78 78 76 78 72 58 80  Wt. (Lbs) 187 187.2 188.6 188 188.8 - 184  BMI 35.33 35.37 35.64 35.52 35.67 34.77 34.77   Foot/eye exam completion dates Latest Ref Rng & Units 02/19/2018 01/20/2018  Eye Exam No Retinopathy - Retinopathy(A)  Foot Form Completion - Done -     MULTIPLE SCLEROSIS Followed by Neurology, no neurologic complaints  Morbid obesity ([place]) Obesity linked with hypertension and diabetes  Patient re-educated about  the importance of commitment to a  minimum of 150 minutes of exercise per week as able.  The importance of healthy food choices with portion control discussed, as well as eating regularly and within a 12 hour window most days. The need to choose "clean , green" food 50 to 75% of the time is discussed, as well as to make water the primary drink and set a goal of 64 ounces water daily.  Encouraged to start a food diary,  and to consider  joining a support group. Sample diet sheets offered. Goals set by the patient for the  next several months.   Weight /BMI 08/21/2018 05/22/2018 05/21/2018  WEIGHT 187 lb 187 lb 3.2 oz 188 lb 9.6 oz  HEIGHT 5\' 1"  5\' 1"  5\' 1"   BMI 35.33 kg/m2 35.37 kg/m2 35.64 kg/m2        Follow Up Instructions:    I discussed the assessment and treatment plan with the patient. The patient was provided an opportunity to ask questions and all were answered. The patient agreed with the plan and demonstrated an understanding of the instructions.   The patient was advised to call back or seek an in-person evaluation if the symptoms worsen or if the condition fails to improve as anticipated.  I provided 25  minutes of non-face-to-face time during this encounter.   Tula Nakayama, MD

## 2018-08-21 NOTE — Patient Instructions (Signed)
F/U in office with MD in early October, call if you need me sooner  PLease take care of Jamiee, so that [place] can take care of others  Please schedule your mammogram as you stated you would do  Please get X ray of right knee , order is placed  Labs tomorrow at [facility] (already ordered)   Please avoid " round up" and chemicals , since you react badly  Keep appt with Dr [name] since you still have problems  It is important that you exercise regularly at least 30 minutes 5 times a week. If you develop chest pain, have severe difficulty breathing, or feel very tired, stop exercising immediately and seek medical attention  Social distancing. Frequent hand washing with soap and water Keeping your hands off of your face. These 3 practices will help to keep both you and your community healthy during this time. Please practice them faithfully!

## 2018-08-22 ENCOUNTER — Encounter: Payer: Self-pay | Admitting: Family Medicine

## 2018-08-22 ENCOUNTER — Telehealth: Payer: Self-pay | Admitting: *Deleted

## 2018-08-22 ENCOUNTER — Ambulatory Visit (HOSPITAL_COMMUNITY)
Admission: RE | Admit: 2018-08-22 | Discharge: 2018-08-22 | Disposition: A | Discharge status: Home / Self Care | Payer: Medicare Other | Source: Ambulatory Visit | Attending: Family Medicine | Admitting: Family Medicine

## 2018-08-22 ENCOUNTER — Other Ambulatory Visit: Payer: Self-pay

## 2018-08-22 DIAGNOSIS — G8929 Other chronic pain: Secondary | ICD-10-CM | POA: Diagnosis not present

## 2018-08-22 DIAGNOSIS — I10 Essential (primary) hypertension: Secondary | ICD-10-CM | POA: Diagnosis not present

## 2018-08-22 DIAGNOSIS — E1169 Type 2 diabetes mellitus with other specified complication: Secondary | ICD-10-CM | POA: Diagnosis not present

## 2018-08-22 DIAGNOSIS — M25561 Pain in right knee: Secondary | ICD-10-CM | POA: Insufficient documentation

## 2018-08-22 NOTE — Telephone Encounter (Signed)
Thanks for ordering!

## 2018-08-22 NOTE — Assessment & Plan Note (Signed)
Uncontrolled, re education done re need to comply and address consisitently Updated lab needed at/ before next visit. Ms. Tortorelli is reminded of the importance of commitment to daily physical activity for 30 minutes or more, as able and the need to limit carbohydrate intake to 30 to 60 grams per meal to help with blood sugar control.   The need to take medication as prescribed, test blood sugar as directed, and to call between visits if there is a concern that blood sugar is uncontrolled is also discussed.   Ms. Biebel is reminded of the importance of daily foot exam, annual eye examination, and good blood sugar, blood pressure and cholesterol control.  Diabetic Labs Latest Ref Rng & Units 05/15/2018 02/19/2018 02/10/2018 10/16/2017 05/20/2017  HbA1c <5.7 % of total Hgb 9.0(H) - 8.0(H) 9.8(H) 7.6(H)  Microalbumin Not Estab. ug/mL - 60.7(H) - - -  Micro/Creat Ratio 0.0 - 30.0 mg/g creat - 25.3 - - -  Chol <200 mg/dL 169 - - 179 -  HDL > OR = 50 mg/dL 63 - - 63 -  Calc LDL mg/dL (calc) 90 - - 99 -  Triglycerides <150 mg/dL 69 - - 78 -  Creatinine 0.50 - 0.99 mg/dL 0.96 - 0.97 1.26(H) 1.06(H)   BP/Weight 08/21/2018 05/22/2018 05/21/2018 05/21/2018 05/14/2018 03/27/2018 38/88/2800  Systolic BP 349 179 150 569 794 801 655  Diastolic BP 78 78 76 78 72 58 80  Wt. (Lbs) 187 187.2 188.6 188 188.8 - 184  BMI 35.33 35.37 35.64 35.52 35.67 34.77 34.77   Foot/eye exam completion dates Latest Ref Rng & Units 02/19/2018 01/20/2018  Eye Exam No Retinopathy - Retinopathy(A)  Foot Form Completion - Done -

## 2018-08-22 NOTE — Assessment & Plan Note (Signed)
Reports recent exposure to weed killer, r with increased allergy symptoms, she has severe uncontrolled allergies esp to chemical exposure, I reminded her of this and asked she not expose herself

## 2018-08-22 NOTE — Telephone Encounter (Signed)
Pt called said Dr.Simpson was going to send her for knee xray but she is over at [facility] and they do not have the order for a knee xray

## 2018-08-22 NOTE — Assessment & Plan Note (Signed)
Controlled, no change in medication DASH diet and commitment to daily physical activity for a minimum of 30 minutes discussed and encouraged, as a part of hypertension management. The importance of attaining a healthy weight is also discussed.  BP/Weight 08/21/2018 05/22/2018 05/21/2018 05/21/2018 05/14/2018 03/27/2018 67/34/1937  Systolic BP 902 409 735 329 924 268 341  Diastolic BP 78 78 76 78 72 58 80  Wt. (Lbs) 187 187.2 188.6 188 188.8 - 184  BMI 35.33 35.37 35.64 35.52 35.67 34.77 34.77

## 2018-08-22 NOTE — Assessment & Plan Note (Signed)
Hyperlipidemia:Low fat diet discussed and encouraged.   Lipid Panel  Lab Results  Component Value Date   CHOL 169 05/15/2018   HDL 63 05/15/2018   LDLCALC 90 05/15/2018   TRIG 69 05/15/2018   CHOLHDL 2.7 05/15/2018   Controlled, no change in medication

## 2018-08-22 NOTE — Assessment & Plan Note (Signed)
Recently started new machine with good success , encouraged her to use daily

## 2018-08-22 NOTE — Assessment & Plan Note (Signed)
Followed by Neurology, no neurologic complaints

## 2018-08-22 NOTE — Telephone Encounter (Signed)
Tried to call patient back to let her know orders have been placed. No answer at number left.

## 2018-08-22 NOTE — Assessment & Plan Note (Signed)
Increased pain s/p fall, has chronic knee pain otherwise X ray of right knee

## 2018-08-22 NOTE — Assessment & Plan Note (Signed)
Obesity linked with hypertension and diabetes  Patient re-educated about  the importance of commitment to a  minimum of 150 minutes of exercise per week as able.  The importance of healthy food choices with portion control discussed, as well as eating regularly and within a 12 hour window most days. The need to choose "clean , green" food 50 to 75% of the time is discussed, as well as to make water the primary drink and set a goal of 64 ounces water daily.  Encouraged to start a food diary,  and to consider  joining a support group. Sample diet sheets offered. Goals set by the patient for the next several months.   Weight /BMI 08/21/2018 05/22/2018 05/21/2018  WEIGHT 187 lb 187 lb 3.2 oz 188 lb 9.6 oz  HEIGHT 5\' 1"  5\' 1"  5\' 1"   BMI 35.33 kg/m2 35.37 kg/m2 35.64 kg/m2

## 2018-08-23 LAB — COMPLETE METABOLIC PANEL WITH GFR
AG Ratio: 1.3 (calc) (ref 1.0–2.5)
ALT: 22 U/L (ref 6–29)
AST: 22 U/L (ref 10–35)
Albumin: 4.3 g/dL (ref 3.6–5.1)
Alkaline phosphatase (APISO): 87 U/L (ref 37–153)
BUN: 17 mg/dL (ref 7–25)
CO2: 25 mmol/L (ref 20–32)
Calcium: 9.9 mg/dL (ref 8.6–10.4)
Chloride: 103 mmol/L (ref 98–110)
Creat: 0.97 mg/dL (ref 0.50–0.99)
GFR, Est African American: 73 mL/min/{1.73_m2} (ref >=60)
GFR, Est Non African American: 63 mL/min/{1.73_m2} (ref >=60)
Globulin: 3.2 g/dL (calc) (ref 1.9–3.7)
Glucose, Bld: 170 mg/dL — ABNORMAL HIGH (ref 65–139)
Potassium: 3.9 mmol/L (ref 3.5–5.3)
Sodium: 139 mmol/L (ref 135–146)
Total Bilirubin: 0.6 mg/dL (ref 0.2–1.2)
Total Protein: 7.5 g/dL (ref 6.1–8.1)

## 2018-08-23 LAB — TSH: TSH: 3.49 mIU/L (ref 0.40–4.50)

## 2018-08-23 LAB — HEMOGLOBIN A1C
Hgb A1c MFr Bld: 9.9 % of total Hgb — ABNORMAL HIGH (ref <5.7)
Mean Plasma Glucose: 237 (calc)
eAG (mmol/L): 13.2 (calc)

## 2018-08-25 ENCOUNTER — Telehealth: Payer: Self-pay

## 2018-08-25 DIAGNOSIS — E1149 Type 2 diabetes mellitus with other diabetic neurological complication: Secondary | ICD-10-CM

## 2018-08-25 DIAGNOSIS — E785 Hyperlipidemia, unspecified: Secondary | ICD-10-CM

## 2018-08-25 DIAGNOSIS — I10 Essential (primary) hypertension: Secondary | ICD-10-CM

## 2018-08-25 NOTE — Telephone Encounter (Signed)
Fasting labs ordered to be drawn last week in September per MD

## 2018-08-26 ENCOUNTER — Telehealth: Payer: Self-pay | Admitting: Family Medicine

## 2018-08-26 ENCOUNTER — Other Ambulatory Visit: Payer: Self-pay

## 2018-08-26 MED ORDER — TRIAMTERENE-HCTZ 37.5-25 MG PO TABS: 1.0000 | ORAL_TABLET | Freq: Every day | ORAL | 1 refills | Status: DC

## 2018-08-26 MED ORDER — GLIPIZIDE 10 MG PO TABS: ORAL_TABLET | ORAL | 1 refills | Status: DC

## 2018-08-26 MED ORDER — NIFEDIPINE ER 90 MG PO TB24: 90.0000 mg | ORAL_TABLET | Freq: Every day | ORAL | 1 refills | Status: DC

## 2018-08-26 MED ORDER — ALLOPURINOL 100 MG PO TABS: 100.0000 mg | ORAL_TABLET | Freq: Every day | ORAL | 1 refills | Status: DC

## 2018-08-26 NOTE — Telephone Encounter (Signed)
meds refilled 

## 2018-08-26 NOTE — Telephone Encounter (Signed)
Please send prescriptions to [facility] --they are waiting

## 2018-08-29 ENCOUNTER — Telehealth: Payer: Self-pay | Admitting: *Deleted

## 2018-08-29 MED ORDER — BLOOD GLUCOSE METER KIT: PACK | 0 refills | Status: DC

## 2018-08-29 NOTE — Telephone Encounter (Signed)
Pt called requesting her test strips for one touch be sent to [facility]. She also said she could use a new meter as hers was going in and out. If this could be sent to [facility] also.

## 2018-08-29 NOTE — Telephone Encounter (Signed)
Script for new meter kit faxed to [facility]

## 2018-09-03 ENCOUNTER — Telehealth: Payer: Self-pay | Admitting: Obstetrics and Gynecology

## 2018-09-03 NOTE — Telephone Encounter (Signed)

## 2018-09-04 ENCOUNTER — Encounter: Payer: Self-pay | Admitting: Obstetrics and Gynecology

## 2018-09-04 ENCOUNTER — Ambulatory Visit (INDEPENDENT_AMBULATORY_CARE_PROVIDER_SITE_OTHER): Payer: Medicare Other | Admitting: Obstetrics and Gynecology

## 2018-09-04 ENCOUNTER — Other Ambulatory Visit: Payer: Self-pay

## 2018-09-04 VITALS — BP 147/82 | HR 67 | Ht 61.0 in | Wt 191.0 lb

## 2018-09-04 DIAGNOSIS — L292 Pruritus vulvae: Secondary | ICD-10-CM | POA: Diagnosis not present

## 2018-09-04 MED ORDER — NYSTATIN 100000 UNIT/GM EX POWD: Freq: Two times a day (BID) | CUTANEOUS | 4 refills | Status: DC

## 2018-09-04 NOTE — Progress Notes (Signed)
Patient ID: Michaela Peterson, female   DOB: 1956/02/19, 63 y.o.   MRN: [id number]    [facility] Visit  _0 @            Patient name: Michaela Peterson MRN [id number]  Date of birth: 1955-07-26  CC & HPI:  Michaela Peterson is a 63 y.o. female presenting today for a rash in her vaginal area. She reports itching between her vagina and anus. She is using folded Kleenex to dry the moisture. She was seen on 05/22/18 for a rectocele and vulvar dystrophy and was prescribed Rx Clobetasol. She said the Clobetasol helped her irritation but that it did not go away completely. The patient denies fever, chills or any other symptoms or complaints at this time.   ROS:  ROS + rash in vaginal area + itching + moisture - fever - chills All systems are negative except as noted in the HPI and PMH.   Pertinent History Reviewed:   Reviewed: Significant for  Medical         Past Medical History:  Diagnosis Date  . Adnexal mass    Left; followed by Dr. [name]  . Anemia, iron deficiency   . Arthritis   . Chronic low back pain   . Diabetes mellitus, type 2 ([place])   . Essential hypertension   . Gastroparesis   . GERD (gastroesophageal reflux disease)   . MS (multiple sclerosis) ([place])   . Nonalcoholic fatty liver disease   . Obesity   . Obesity, diabetes, and hypertension syndrome ([place]) 03/12/2009   Qualifier: Diagnosis of  By: Moshe Cipro MD, Joycelyn Schmid    . Sleep apnea 2009                              Surgical Hx:    Past Surgical History:  Procedure Laterality Date  . AGILE CAPSULE N/A 08/11/2014   Procedure: AGILE CAPSULE;  Surgeon: Daneil Dolin, MD;  [facility];  Service: Endoscopy;  Laterality: N/A;  0700  . BLADDER SUSPENSION    . CESAREAN SECTION     x2   . CHOLECYSTECTOMY    . COLONOSCOPY  2010   Dr. [name]: tubular adenoma, few scattered diverticula  . COLONOSCOPY N/A 10/01/2013   Dr. Pixie Casino preparation. Normal rectum. Normal colonic mucosa  . DILATION AND CURETTAGE OF  UTERUS     4  . ESOPHAGOGASTRODUODENOSCOPY  2010   Dr. [name]: normal esophagus, small hiatal hernia, questionable pale duodenal mucosa but negative for celiac sprue.   . ESOPHAGOGASTRODUODENOSCOPY N/A 10/01/2013   Dr. Rourk:gastric polyps-status post biopsy. Otherwise, normal EGD. fundic gland polyp, negative H.pylori  . GIVENS CAPSULE STUDY N/A 09/08/2014   Poor prep but overall unremarkable   . KNEE ARTHROSCOPY W/ DEBRIDEMENT    . LAPAROSCOPY ABDOMEN DIAGNOSTIC     lysis of adhesions  . ROTATOR CUFF REPAIR    . TENDON LENGTHENING     Left wrist  . UMBILICAL HERNIA REPAIR     Medications: Reviewed & Updated - see associated section                       Current Outpatient Medications:  .  allopurinol (ZYLOPRIM) 100 MG tablet, Take 1 tablet (100 mg total) by mouth daily., Disp: 90 tablet, Rfl: 1 .  aspirin EC 81 MG tablet, Take 81 mg by mouth daily., Disp: , Rfl:  .  azelastine (  ASTELIN) 0.1 % nasal spray, Place 2 sprays into both nostrils 2 (two) times daily. Use in each nostril as directed, Disp: 30 mL, Rfl: 3 .  azelastine (OPTIVAR) 0.05 % ophthalmic solution, Place 1 drop into both eyes 2 (two) times daily., Disp: 6 mL, Rfl: 12 .  blood glucose meter kit and supplies, Dispense based on patient and insurance preference. Use to test once daily, Disp: 1 each, Rfl: 0 .  diphenhydrAMINE (BENADRYL) 25 MG tablet, Take 25 mg by mouth every 6 (six) hours as needed for itching., Disp: , Rfl:  .  glipiZIDE (GLUCOTROL) 10 MG tablet, TAKE 1 TABLET BY MOUTH 2  TIMES DAILY BEFORE MEALS, Disp: 180 tablet, Rfl: 1 .  glucose blood (ONE TOUCH ULTRA TEST) test strip, USE UP TO 4 TIMES DAILY AS  DIRECTED, Disp: 400 each, Rfl: 1 .  metFORMIN (GLUCOPHAGE) 1000 MG tablet, TAKE 1 TABLET BY MOUTH TWO  TIMES DAILY WITH MEALS, Disp: 180 tablet, Rfl: 1 .  mometasone (NASONEX) 50 MCG/ACT nasal spray, Place 2 sprays into the nose daily., Disp: 17 g, Rfl: 12 .  montelukast (SINGULAIR) 10 MG tablet, TAKE 1 TABLET BY  MOUTH AT  BEDTIME, Disp: 90 tablet, Rfl: 1 .  NIFEdipine (ADALAT CC) 90 MG 24 hr tablet, Take 1 tablet (90 mg total) by mouth daily., Disp: 90 tablet, Rfl: 1 .  ONETOUCH DELICA LANCETS 65B MISC, USE UP TO 4 TIMES DAILY AS  DIRECTED, Disp: 400 each, Rfl: 1 .  rosuvastatin (CRESTOR) 5 MG tablet, One tablet three times per week, every Monday, Wednesday and Friday, Disp: 36 tablet, Rfl: 3 .  triamterene-hydrochlorothiazide (MAXZIDE-25) 37.5-25 MG tablet, , Disp: , Rfl:    Social History: Reviewed -  reports that she has never smoked. She has never used smokeless tobacco.  Objective Findings:  Vitals: Blood pressure (!) 147/82, pulse 67, height _0  (1.549 m), weight 191 lb (86.6 kg), last menstrual period 08/18/2012.  PHYSICAL EXAMINATION General appearance - alert, well appearing, and in no distress, oriented to person, place, and time and overweight Mental status - alert, oriented to person, place, and time, normal mood, behavior, speech, dress, motor activity, and thought processes, affect appropriate to mood  PELVIC External genitalia - healthy appearing tissues  Assessment & Plan:   A:  1.  Chronic Moisture,  2. Possible subclinical yeast 3. Vulvar dystrophy doesn't seem active at present.  P:  1. Rx Nystatin powder use 2x /wk 2. Discontinue Clobetasol use 3. 3 dry vulva regimen with use of tissues in inter gluteal fold and over posterior fourchette.  By signing my name below, I, De Burrs, attest that this documentation has been prepared under the direction and in the presence of Jonnie Kind, MD. Electronically Signed: De Burrs, Medical Scribe. 09/04/18. 10:55 AM.  I personally performed the services described in this documentation, which was SCRIBED in my presence. The recorded information has been reviewed and considered accurate. It has been edited as necessary during review. Jonnie Kind, MD

## 2018-09-11 ENCOUNTER — Other Ambulatory Visit: Payer: Self-pay

## 2018-09-11 ENCOUNTER — Encounter: Payer: Self-pay | Admitting: Family Medicine

## 2018-09-11 ENCOUNTER — Ambulatory Visit (INDEPENDENT_AMBULATORY_CARE_PROVIDER_SITE_OTHER): Payer: Medicare Other | Admitting: Family Medicine

## 2018-09-11 VITALS — Ht 61.0 in | Wt 190.0 lb

## 2018-09-11 DIAGNOSIS — J9801 Acute bronchospasm: Secondary | ICD-10-CM

## 2018-09-11 DIAGNOSIS — R0602 Shortness of breath: Secondary | ICD-10-CM | POA: Diagnosis not present

## 2018-09-11 MED ORDER — ALBUTEROL SULFATE HFA 108 (90 BASE) MCG/ACT IN AERS: 2.0000 | INHALATION_SPRAY | RESPIRATORY_TRACT | 1 refills | Status: DC | PRN

## 2018-09-11 NOTE — Progress Notes (Signed)
Virtual Visit via Telephone Note   This visit type was conducted due to national recommendations for restrictions regarding the COVID-19 Pandemic (e.g. social distancing) in an effort to limit this patient's exposure and mitigate transmission in our community.  Due to her co-morbid illnesses, this patient is at least at moderate risk for complications without adequate follow up.  This format is felt to be most appropriate for this patient at this time.  The patient did not have access to video technology/had technical difficulties with video requiring transitioning to audio format only (telephone).  All issues noted in this document were discussed and addressed.  No physical exam could be performed with this format.    Evaluation Performed:  Follow-up visit  Date:  09/11/2018   ID:  Michaela Peterson, Michaela Peterson Nov 02, 1955, MRN [id number]  Patient Location: Home Provider Location: Office  Location of Patient: Home Location of Provider: Telehealth Consent was obtain for visit to be over via telehealth. I verified that I am speaking with the correct person using two identifiers.  PCP:  [name], MD   Chief Complaint:    History of Present Illness:    Michaela Peterson is a 63 y.o. female with  Presents today with continued shortness of breath intermittently through the day.  Denies having exposure to COVID or anyone is positive for signs or symptoms of for being evaluated for COVID.  Reports that she might have some improvement over the last day or so but is been ongoing.  So she felt that she would call.  Visit couple weeks ago for Moshe Cipro she was complaining of flare of allergies as well.  Secondary to spraying bug spray and cleaning chemicals.  At that time Dr. Moshe Cipro advised for her to remove herself from exposure risk.  But not using these particular items.  She tried to use Lysol and is flared up again.  She does not currently have an inhaler for use for shortness of breath.   Reports that she will be going to have some testing done for COVID just to be on the safe side tomorrow.  Her and her sister.  The patient does not have symptoms concerning for COVID-19 infection (fever, chills, cough, or new shortness of breath).   Past Medical, Surgical, Social History, Allergies, and Medications have been Reviewed.   Past Medical History:  Diagnosis Date  . Adnexal mass    Left; followed by Dr. [name]  . Anemia, iron deficiency   . Arthritis   . Chronic low back pain   . Diabetes mellitus, type 2 ([place])   . Essential hypertension   . Gastroparesis   . GERD (gastroesophageal reflux disease)   . MS (multiple sclerosis) ([place])   . Nonalcoholic fatty liver disease   . Obesity   . Obesity, diabetes, and hypertension syndrome ([place]) 03/12/2009   Qualifier: Diagnosis of  By: Moshe Cipro MD, Joycelyn Schmid    . Sleep apnea 2009   Past Surgical History:  Procedure Laterality Date  . AGILE CAPSULE N/A 08/11/2014   Procedure: AGILE CAPSULE;  Surgeon: Daneil Dolin, MD;  [facility];  Service: Endoscopy;  Laterality: N/A;  0700  . BLADDER SUSPENSION    . CESAREAN SECTION     x2   . CHOLECYSTECTOMY    . COLONOSCOPY  2010   Dr. [name]: tubular adenoma, few scattered diverticula  . COLONOSCOPY N/A 10/01/2013   Dr. Pixie Casino preparation. Normal rectum. Normal colonic mucosa  . DILATION AND  CURETTAGE OF UTERUS     4  . ESOPHAGOGASTRODUODENOSCOPY  2010   Dr. [name]: normal esophagus, small hiatal hernia, questionable pale duodenal mucosa but negative for celiac sprue.   . ESOPHAGOGASTRODUODENOSCOPY N/A 10/01/2013   Dr. Rourk:gastric polyps-status post biopsy. Otherwise, normal EGD. fundic gland polyp, negative H.pylori  . GIVENS CAPSULE STUDY N/A 09/08/2014   Poor prep but overall unremarkable   . KNEE ARTHROSCOPY W/ DEBRIDEMENT    . LAPAROSCOPY ABDOMEN DIAGNOSTIC     lysis of adhesions  . ROTATOR CUFF REPAIR    . TENDON LENGTHENING     Left wrist  . UMBILICAL  HERNIA REPAIR       Current Meds  Medication Sig  . allopurinol (ZYLOPRIM) 100 MG tablet Take 1 tablet (100 mg total) by mouth daily.  Marland Kitchen aspirin EC 81 MG tablet Take 81 mg by mouth daily.  Marland Kitchen azelastine (ASTELIN) 0.1 % nasal spray Place 2 sprays into both nostrils 2 (two) times daily. Use in each nostril as directed  . azelastine (OPTIVAR) 0.05 % ophthalmic solution Place 1 drop into both eyes 2 (two) times daily.  . blood glucose meter kit and supplies Dispense based on patient and insurance preference. Use to test once daily  . diphenhydrAMINE (BENADRYL) 25 MG tablet Take 25 mg by mouth every 6 (six) hours as needed for itching.  Marland Kitchen glipiZIDE (GLUCOTROL) 10 MG tablet TAKE 1 TABLET BY MOUTH 2  TIMES DAILY BEFORE MEALS  . glucose blood (ONE TOUCH ULTRA TEST) test strip USE UP TO 4 TIMES DAILY AS  DIRECTED  . metFORMIN (GLUCOPHAGE) 1000 MG tablet TAKE 1 TABLET BY MOUTH TWO  TIMES DAILY WITH MEALS  . mometasone (NASONEX) 50 MCG/ACT nasal spray Place 2 sprays into the nose daily.  . montelukast (SINGULAIR) 10 MG tablet TAKE 1 TABLET BY MOUTH AT  BEDTIME  . NIFEdipine (ADALAT CC) 90 MG 24 hr tablet Take 1 tablet (90 mg total) by mouth daily.  Marland Kitchen nystatin (MYCOSTATIN/NYSTOP) powder Apply topically 2 (two) times daily. To irritated skin  . ONETOUCH DELICA LANCETS 59R MISC USE UP TO 4 TIMES DAILY AS  DIRECTED  . rosuvastatin (CRESTOR) 5 MG tablet One tablet three times per week, every Monday, Wednesday and Friday  . triamterene-hydrochlorothiazide (MAXZIDE-25) 37.5-25 MG tablet      Allergies:   Gabapentin, Ace inhibitors, Aspirin, Oxycodone-acetaminophen, Penicillins, Rebif [interferon beta-1a], and Statins   Social History   Tobacco Use  . Smoking status: Never Smoker  . Smokeless tobacco: Never Used  . Tobacco comment: patient lives with a smoker   Substance Use Topics  . Alcohol use: No  . Drug use: No     Family Hx: The patient's family history includes Arthritis in her mother; Colon  cancer in her maternal aunt; Diabetes in her sister; Heart disease in her sister; Heart failure in her mother; Hypertension in her brother, mother, sister, sister, and son; Kidney failure in her sister; Mental illness in her brother; Stroke in her father.  ROS:   Please see the history of present illness.    All other systems reviewed and are negative.   Labs/Other Tests and Data Reviewed:    Recent Labs: 05/15/2018: Hemoglobin 11.3; Platelets 395 08/22/2018: ALT 22; BUN 17; Creat 0.97; Potassium 3.9; Sodium 139; TSH 3.49   Recent Lipid Panel Lab Results  Component Value Date/Time   CHOL 169 05/15/2018 10:26 AM   TRIG 69 05/15/2018 10:26 AM   HDL 63 05/15/2018 10:26 AM   CHOLHDL 2.7  05/15/2018 10:26 AM   LDLCALC 90 05/15/2018 10:26 AM    Wt Readings from Last 3 Encounters:  09/11/18 190 lb (86.2 kg)  09/04/18 191 lb (86.6 kg)  08/21/18 187 lb (84.8 kg)     Objective:    Vital Signs:  Ht 5' 1"  (1.549 m)   Wt 190 lb (86.2 kg)   LMP 08/18/2012   BMI 35.90 kg/m    GEN:  Alert and oriented RESPIRATORY:  Slight shortness of breath, able to carry conversation. PSYCH:  normal Affect, mood and good communication  ASSESSMENT & PLAN:    1. Bronchospasm, acute Bronchospasm secondary to chemical use in the home for cleaning.  Recently is only having issues with chemical use outside we are killing.  Advised to avoid these products as much as possible.  Previous appointment with Dr. Moshe Cipro she was also advised to avoid them as well.  We will provide her with albuterol inhaler to help with short duration of shortness of breath and her bronchospasm.  Does not have any other signs or symptoms of COVID at this time.  Advised where she can go get testing done for free.  Also advised of signs and symptoms that she might incur over the weekend and when to go to the emergency room.  Reviewed side effects, risks and benefits of medication.   Patient acknowledged agreement and understanding of the  plan.    - albuterol (VENTOLIN HFA) 108 (90 Base) MCG/ACT inhaler; Inhale 2 puffs into the lungs every 4 (four) hours as needed for wheezing or shortness of breath.  Dispense: 18 g; Refill: 1  2. Shortness of breath See above  - albuterol (VENTOLIN HFA) 108 (90 Base) MCG/ACT inhaler; Inhale 2 puffs into the lungs every 4 (four) hours as needed for wheezing or shortness of breath.  Dispense: 18 g; Refill: 1   Time:   Today, I have spent 10 minutes with the patient with telehealth technology discussing the above problems.     Medication Adjustments/Labs and Tests Ordered: Current medicines are reviewed at length with the patient today.  Concerns regarding medicines are outlined above.   Tests Ordered: No orders of the defined types were placed in this encounter.   Medication Changes: Meds ordered this encounter  Medications  . albuterol (VENTOLIN HFA) 108 (90 Base) MCG/ACT inhaler    Sig: Inhale 2 puffs into the lungs every 4 (four) hours as needed for wheezing or shortness of breath.    Dispense:  18 g    Refill:  1    Order Specific Question:   Supervising Provider    Answer:   [name] [[id number]]    Disposition:  Follow up prn  Signed, Perlie Mayo, NP  09/11/2018 4:16 PM     [facility]

## 2018-09-11 NOTE — Patient Instructions (Signed)
    Thank you for coming into the office today. I appreciate the opportunity to provide you with the care for your health and wellness. Today we discussed: allergies and shortness of breath  Follow up as needed No labs today.  Pick up inhaler at pharmacy and use as directed.  Discussed COVID testing.   AVOID CHEMICAL CLEANING PRODUCTS.  As discussed during visit, please go to the nearest ER if you have worsen shortness of breath, develop fever, chills, cough or on going headache or other symptoms of infection.   Please continue to practice social distancing to keep you, your family, and our community safe.  If you must go out, please wear a Mask and practice good handwashing.  [place] YOUR HANDS WELL AND FREQUENTLY. AVOID TOUCHING YOUR FACE, UNLESS YOUR HANDS ARE FRESHLY WASHED.  GET FRESH AIR DAILY. STAY HYDRATED WITH WATER.   It was a pleasure to see you and I look forward to continuing to work together on your health and well-being. Please do not hesitate to call the office if you need care or have questions about your care.  Have a wonderful day and weekend.  With Gratitude,  Cherly Beach, DNP, AGNP-BC

## 2018-09-15 ENCOUNTER — Inpatient Hospital Stay (HOSPITAL_COMMUNITY): Admission: RE | Admit: 2018-09-15 | Payer: Medicare Other | Source: Ambulatory Visit

## 2018-09-17 ENCOUNTER — Ambulatory Visit: Payer: Medicare Other | Admitting: Gastroenterology

## 2018-09-17 ENCOUNTER — Encounter: Payer: Self-pay | Admitting: Gastroenterology

## 2018-09-17 ENCOUNTER — Other Ambulatory Visit: Payer: Self-pay

## 2018-09-17 VITALS — BP 156/80 | HR 73 | Temp 96.5°F | Ht 61.0 in | Wt 188.2 lb

## 2018-09-17 DIAGNOSIS — Z860100 Personal history of colon polyps, unspecified: Secondary | ICD-10-CM | POA: Insufficient documentation

## 2018-09-17 DIAGNOSIS — K219 Gastro-esophageal reflux disease without esophagitis: Secondary | ICD-10-CM | POA: Diagnosis not present

## 2018-09-17 DIAGNOSIS — K760 Fatty (change of) liver, not elsewhere classified: Secondary | ICD-10-CM

## 2018-09-17 NOTE — Patient Instructions (Signed)
We are arranging a colonoscopy with Dr. [name] in the near future.  Do not take metformin or glucotrol on the day of the procedure.  We will see you in 6-8 months!  I enjoyed seeing you again today! As you know, I value our relationship and want to provide genuine, compassionate, and quality care. I welcome your feedback. If you receive a survey regarding your visit,  I greatly appreciate you taking time to fill this out. See you next time!  Annitta Needs, PhD, ANP-BC [facility]

## 2018-09-17 NOTE — Assessment & Plan Note (Signed)
63 year old very pleasant female with history of polyps in remote past, with 5-year-surveillance due now. No concerning lower GI signs/symptoms.   Proceed with TCS with Dr. [name] in near future: the risks, benefits, and alternatives have been discussed with the patient in detail. The patient states understanding and desires to proceed.

## 2018-09-17 NOTE — Assessment & Plan Note (Signed)
Controlled with Protonix. Taking BID but discussed reducing to lowest effective dose, starting with once each morning, 30 minutes before breakfast. Return in 6-8 months for routine follow-up.

## 2018-09-17 NOTE — Progress Notes (Signed)
CC'ED TO PCP 

## 2018-09-17 NOTE — Progress Notes (Signed)
Primary Care Physician:  [name], MD Primary GI: Dr. [name]  Chief Complaint  Patient presents with  . Anemia    f/u, doing ok    HPI:   Michaela Peterson is a 63 y.o. female presenting today with a history of low normal ferritin, low iron, Hgb 12 range historically. Colonoscopy/EGD in July 2015 and capsule unremarkable June 2016.Constipation, GERD chronic. History of gastroparesis. Mildly elevated transaminases in the past with plans to pursue ultrasound if continues to remain elevated; however, recent LFTs normal.   Due for surveillance colonoscopy July 2020 with history of polyps  Hgb 11.3 in March 2020, improved from Dec 2019, ferritin 104 a few months ago. Not on iron.   No epigastric pain. Has had more frequent stools so not taking fiber. On metformin. Softer stools. Lower abdominal discomfort, vague, occasionally. Feels dehydrated at times. Hard to drink water. "can drink anything but water". Starts feeling better if drinking more. No rectal bleeding.   Protonix BID but sometimes will forget. Will not have reflux flares at that time. Still hard to eat breakfast in the morning. Not much of an appetite in the morning. Trying to get on a schedule.    Past Medical History:  Diagnosis Date  . Adnexal mass    Left; followed by Dr. [name]  . Anemia, iron deficiency   . Arthritis   . Chronic low back pain   . Diabetes mellitus, type 2 ([place])   . Essential hypertension   . Gastroparesis   . GERD (gastroesophageal reflux disease)   . MS (multiple sclerosis) ([place])   . Nonalcoholic fatty liver disease   . Obesity   . Obesity, diabetes, and hypertension syndrome ([place]) 03/12/2009   Qualifier: Diagnosis of  By: Moshe Cipro MD, Joycelyn Schmid    . Sleep apnea 2009    Past Surgical History:  Procedure Laterality Date  . AGILE CAPSULE N/A 08/11/2014   Procedure: AGILE CAPSULE;  Surgeon: Daneil Dolin, MD;  Location: [facility];  Service: Endoscopy;  Laterality: N/A;  0700  .  BLADDER SUSPENSION    . CESAREAN SECTION     x2   . CHOLECYSTECTOMY    . COLONOSCOPY  2010   Dr. [name]: tubular adenoma, few scattered diverticula  . COLONOSCOPY N/A 10/01/2013   Dr. Pixie Casino preparation. Normal rectum. Normal colonic mucosa  . DILATION AND CURETTAGE OF UTERUS     4  . ESOPHAGOGASTRODUODENOSCOPY  2010   Dr. [name]: normal esophagus, small hiatal hernia, questionable pale duodenal mucosa but negative for celiac sprue.   . ESOPHAGOGASTRODUODENOSCOPY N/A 10/01/2013   Dr. Rourk:gastric polyps-status post biopsy. Otherwise, normal EGD. fundic gland polyp, negative H.pylori  . GIVENS CAPSULE STUDY N/A 09/08/2014   Poor prep but overall unremarkable   . KNEE ARTHROSCOPY W/ DEBRIDEMENT    . LAPAROSCOPY ABDOMEN DIAGNOSTIC     lysis of adhesions  . ROTATOR CUFF REPAIR    . TENDON LENGTHENING     Left wrist  . UMBILICAL HERNIA REPAIR      Current Outpatient Medications  Medication Sig Dispense Refill  . albuterol (VENTOLIN HFA) 108 (90 Base) MCG/ACT inhaler Inhale 2 puffs into the lungs every 4 (four) hours as needed for wheezing or shortness of breath. 18 g 1  . allopurinol (ZYLOPRIM) 100 MG tablet Take 1 tablet (100 mg total) by mouth daily. 90 tablet 1  . aspirin EC 81 MG tablet Take 81 mg by mouth daily.    Marland Kitchen azelastine (ASTELIN) 0.1 %  nasal spray Place 2 sprays into both nostrils 2 (two) times daily. Use in each nostril as directed 30 mL 3  . azelastine (OPTIVAR) 0.05 % ophthalmic solution Place 1 drop into both eyes 2 (two) times daily. 6 mL 12  . blood glucose meter kit and supplies Dispense based on patient and insurance preference. Use to test once daily 1 each 0  . diphenhydrAMINE (BENADRYL) 25 MG tablet Take 25 mg by mouth every 6 (six) hours as needed for itching.    Marland Kitchen glipiZIDE (GLUCOTROL) 10 MG tablet TAKE 1 TABLET BY MOUTH 2  TIMES DAILY BEFORE MEALS 180 tablet 1  . glucose blood (ONE TOUCH ULTRA TEST) test strip USE UP TO 4 TIMES DAILY AS  DIRECTED 400 each  1  . metFORMIN (GLUCOPHAGE) 1000 MG tablet TAKE 1 TABLET BY MOUTH TWO  TIMES DAILY WITH MEALS 180 tablet 1  . mometasone (NASONEX) 50 MCG/ACT nasal spray Place 2 sprays into the nose daily. 17 g 12  . montelukast (SINGULAIR) 10 MG tablet TAKE 1 TABLET BY MOUTH AT  BEDTIME 90 tablet 1  . NIFEdipine (ADALAT CC) 90 MG 24 hr tablet Take 1 tablet (90 mg total) by mouth daily. 90 tablet 1  . nystatin (MYCOSTATIN/NYSTOP) powder Apply topically 2 (two) times daily. To irritated skin 60 g 4  . ONETOUCH DELICA LANCETS 41D MISC USE UP TO 4 TIMES DAILY AS  DIRECTED 400 each 1  . pantoprazole (PROTONIX) 40 MG tablet Take 40 mg by mouth 2 (two) times daily before a meal.    . rosuvastatin (CRESTOR) 5 MG tablet One tablet three times per week, every Monday, Wednesday and Friday 36 tablet 3  . triamterene-hydrochlorothiazide (MAXZIDE-25) 37.5-25 MG tablet Take 1 tablet by mouth daily.      No current facility-administered medications for this visit.     Allergies as of 09/17/2018 - Review Complete 09/17/2018  Allergen Reaction Noted  . Gabapentin Shortness Of Breath 02/13/2017  . Ace inhibitors Cough 03/26/2012  . Aspirin    . Oxycodone-acetaminophen Hives 02/21/2007  . Penicillins Hives   . Rebif [interferon beta-1a] Other (See Comments) 05/01/2018  . Statins  10/11/2012    Family History  Problem Relation Age of Onset  . Hypertension Sister   . Diabetes Sister   . Heart disease Sister   . Kidney failure Sister   . Heart failure Mother   . Hypertension Mother   . Arthritis Mother   . Stroke Father   . Hypertension Son   . Hypertension Sister   . Hypertension Brother   . Mental illness Brother   . Colon cancer Maternal Aunt     Social History   Socioeconomic History  . Marital status: Married    Spouse name: [name]   . Number of children: 4  . Years of education: 9  . Highest education level: Not on file  Occupational History  . Occupation: unemployed     Fish farm manager: UNEMPLOYED   Social Needs  . Financial resource strain: Somewhat hard  . Food insecurity    Worry: Never true    Inability: Never true  . Transportation needs    Medical: No    Non-medical: No  Tobacco Use  . Smoking status: Never Smoker  . Smokeless tobacco: Never Used  . Tobacco comment: patient lives with a smoker   Substance and Sexual Activity  . Alcohol use: No  . Drug use: No  . Sexual activity: Yes    Birth control/protection: Post-menopausal  Lifestyle  . Physical activity    Days per week: 0 days    Minutes per session: 0 min  . Stress: Only a little  Relationships  . Social connections    Talks on phone: More than three times a week    Gets together: Three times a week    Attends religious service: More than 4 times per year    Active member of club or organization: No    Attends meetings of clubs or organizations: Never    Relationship status: Married  Other Topics Concern  . Not on file  Social History Narrative   Patient lives at home with husband [name].    Patient has 4 children.    Patient has a some college.    Patient is right handed.    Patient is currently not working.     Review of Systems: Gen: Denies fever, chills, anorexia. Denies fatigue, weakness, weight loss.  CV: Denies chest pain, palpitations, syncope, peripheral edema, and claudication. Resp: Denies dyspnea at rest, cough, wheezing, coughing up blood, and pleurisy. GI: Denies vomiting blood, jaundice, and fecal incontinence.   Denies dysphagia or odynophagia. Derm: Denies rash, itching, dry skin Psych: Denies depression, anxiety, memory loss, confusion. No homicidal or suicidal ideation.  Heme: Denies bruising, bleeding, and enlarged lymph nodes.  Physical Exam: BP (!) 156/80   Pulse 73   Temp (!) 96.5 F (35.8 C) (Oral)   Ht _0  (1.549 m)   Wt 188 lb 3.2 oz (85.4 kg)   LMP 08/18/2012   BMI 35.56 kg/m  General:   Alert and oriented. No distress noted. Pleasant and cooperative.  Head:   Normocephalic and atraumatic. Eyes:  Conjuctiva clear without scleral icterus. Mouth:  Oral mucosa pink and moist. Go Lungs: clear bilaterally Cardiac: S1 S2 present without murmurs  Abdomen:  +BS, soft, non-tender and non-distended. No rebound or guarding. No HSM or masses noted. Msk:  Symmetrical without gross deformities. Normal posture. Extremities:  Without edema. Neurologic:  Alert and  oriented x4 Psych:  Alert and cooperative. Normal mood and affect.  Lab Results  Component Value Date   WBC 8.3 05/15/2018   HGB 11.3 (L) 05/15/2018   HCT 33.5 (L) 05/15/2018   MCV 83.1 05/15/2018   PLT 395 05/15/2018   Lab Results  Component Value Date   ALT 22 08/22/2018   AST 22 08/22/2018   ALKPHOS 85 12/19/2016   BILITOT 0.6 08/22/2018   Lab Results  Component Value Date   CREATININE 0.97 08/22/2018   BUN 17 08/22/2018   NA 139 08/22/2018   K 3.9 08/22/2018   CL 103 08/22/2018   CO2 25 08/22/2018   Lab Results  Component Value Date   IRON 50 05/15/2018   TIBC 330 05/15/2018   FERRITIN 104 05/15/2018

## 2018-09-17 NOTE — Assessment & Plan Note (Signed)
LFTs normal. Continue to follow serially. Continue to pursue lifestyle/behavior modifications.

## 2018-09-18 ENCOUNTER — Other Ambulatory Visit: Payer: Self-pay

## 2018-09-18 ENCOUNTER — Telehealth: Payer: Self-pay

## 2018-09-18 ENCOUNTER — Ambulatory Visit: Payer: Medicare Other | Admitting: Internal Medicine

## 2018-09-18 ENCOUNTER — Encounter: Payer: Self-pay | Admitting: Internal Medicine

## 2018-09-18 DIAGNOSIS — G4733 Obstructive sleep apnea (adult) (pediatric): Secondary | ICD-10-CM

## 2018-09-18 DIAGNOSIS — R0600 Dyspnea, unspecified: Secondary | ICD-10-CM | POA: Diagnosis not present

## 2018-09-18 MED ORDER — BREO ELLIPTA 100-25 MCG/INH IN AEPB: 1.0000 | INHALATION_SPRAY | Freq: Every day | RESPIRATORY_TRACT | 0 refills | Status: DC

## 2018-09-18 MED ORDER — BREO ELLIPTA 100-25 MCG/INH IN AEPB: INHALATION_SPRAY | RESPIRATORY_TRACT | 12 refills | Status: DC

## 2018-09-18 NOTE — Telephone Encounter (Signed)
Tried to call pt to schedule TCS w/[facility], no answer, LMOAM for return call. 

## 2018-09-18 NOTE — Progress Notes (Signed)
Patient seen in the office today and instructed on use of Breo 100.  Patient expressed understanding and demonstrated technique. 

## 2018-09-18 NOTE — Patient Instructions (Signed)
Sample and printed script for Breo 100 maintenance inhaler    Inhale 1 puff, then rinse mouth well,once daily, every day     See if you find you breathe better with less need for your albuterol rescue inhaler.  We can continue CPAP auto 5-12, mask of choice, humidifier, supplies, AirView/ card You can continue to work with [facility] on mask fit. Let us know if you still have problems.  Please call us if we can help

## 2018-09-18 NOTE — Progress Notes (Signed)
HPI female never smoker followed for OSA/ insomnia, complicated by HBP, GERD, DM 2/gastroparesis, Multiple Sclerosis Home Sleep Test- 08/27/16-AHI 25.4/hour, desaturation to 80%, body weight 184.6 pounds ----------------------------------------------------------------------------------------  09/17/2017- 63 year old female never smoker followed for OSA/ insomnia, complicated by HBP, GERD, DM 2/gastroparesis, Multiple Sclerosis CPAP auto 5-12/ [facility] -----OSA: DME: [facility]. Pt wears CPAP nightly and DL attached.  Complaints of feeling hot in her nasal mask.  Does know how to adjust her humidifier. Download 93% compliance AHI 3.3/hour.  09/18/2018- 63 year old female never smoker followed for OSA/ insomnia, complicated by HBP, GERD, DM 2/gastroparesis, Multiple Sclerosis CPAP auto 5-12/ [facility] Body weight today 187 lbs Download- compliance 90%, AHI 5.0/ hr -----OSA on CPAP 5-12, DME: [facility]; pt reports having issues w/ seal of mask Discussed mask fit. Still sleeps better w CPAP. Not tolerating hot/ humid weather well at all and sensitive to "chemicals"- cleaning products, strong odors. Questions past hx asthma.  ROS-see HPI   + = positive Constitutional:    weight loss, night sweats, fevers, chills, + fatigue, lassitude. HEENT:    headaches, difficulty swallowing, tooth/dental problems, sore throat,       sneezing, itching, ear ache, nasal congestion, post nasal drip, snoring CV:    chest pain, orthopnea, PND, swelling in lower extremities, anasarca,                                  dizziness, palpitations Resp:   +shortness of breath with exertion or at rest.                productive cough,   non-productive cough, coughing up of blood.              change in color of mucus.  wheezing.   Skin:    rash or lesions. GI:  No-   heartburn, indigestion, abdominal pain, nausea, vomiting, diarrhea,                 change in bowel habits, loss of appetite GU: dysuria, change in color of urine, no  urgency or frequency.   flank pain. MS:   joint pain, stiffness, decreased range of motion, back pain. Neuro-     nothing unusual Psych:  change in mood or affect.  depression or anxiety.   memory loss.  OBJ- Physical Exam General- Alert, Oriented, Affect-appropriate, Distress- none acute, + overweight Skin- rash-none, lesions- none, excoriation- none Lymphadenopathy- none Head- atraumatic            Eyes- Gross vision intact, PERRLA, conjunctivae and secretions clear            Ears- Hearing, canals-normal            Nose- Clear, no-Septal dev, mucus, polyps, erosion, perforation             Throat- Mallampati IV , mucosa clear , drainage- none, tonsils- atrophic Neck- flexible , trachea midline, no stridor , thyroid nl, carotid no bruit Chest - symmetrical excursion , unlabored           Heart/CV- RRR , no murmur , no gallop  , no rub, nl s1 s2                           - JVD- none , edema- none, stasis changes- none, varices- none           Lung- clear to P&A, wheeze- none,  cough- none , dullness-none, rub- none           Chest wall-  Abd-  Br/ Gen/ Rectal- Not done, not indicated Extrem- cyanosis- none, clubbing, none, atrophy- none, strength- nl Neuro- grossly intact to observation

## 2018-09-19 ENCOUNTER — Other Ambulatory Visit: Payer: Self-pay

## 2018-09-19 DIAGNOSIS — R0600 Dyspnea, unspecified: Secondary | ICD-10-CM

## 2018-09-19 DIAGNOSIS — Z86010 Personal history of colonic polyps: Secondary | ICD-10-CM

## 2018-09-19 HISTORY — DX: Dyspnea, unspecified: R06.00

## 2018-09-19 MED ORDER — PEG 3350-KCL-NA BICARB-NACL 420 G PO SOLR: 4000.0000 mL | ORAL | 0 refills | Status: DC

## 2018-09-19 NOTE — Assessment & Plan Note (Signed)
Sensitive to humidity and irritants. Possibly manage as mild intermittent asthma? Plan- try Breo 100

## 2018-09-19 NOTE — Assessment & Plan Note (Signed)
Discussed compliance and comfort goals Plan- continue auto 5-12. Work with DME on mask fit.

## 2018-09-19 NOTE — Telephone Encounter (Signed)
Called pt, TCS scheduled for 11/12/18 at 10:00am. COVID test scheduled for 11/10/18 at 9:00am. Pt aware to quarantine at home after test until procedure. Rx for prep sent to pharmacy. Orders entered. Appt letter and procedure instructions mailed to pt.

## 2018-09-24 ENCOUNTER — Other Ambulatory Visit: Payer: Medicare Other

## 2018-09-24 ENCOUNTER — Other Ambulatory Visit: Payer: Self-pay

## 2018-09-24 DIAGNOSIS — R6889 Other general symptoms and signs: Secondary | ICD-10-CM | POA: Diagnosis not present

## 2018-09-24 DIAGNOSIS — Z20822 Contact with and (suspected) exposure to covid-19: Secondary | ICD-10-CM

## 2018-09-24 NOTE — Progress Notes (Signed)
[id number] 

## 2018-09-28 LAB — NOVEL CORONAVIRUS, NAA: SARS-CoV-2, NAA: NOT DETECTED

## 2018-09-29 ENCOUNTER — Other Ambulatory Visit: Payer: Self-pay

## 2018-09-29 ENCOUNTER — Ambulatory Visit (HOSPITAL_COMMUNITY)
Admission: RE | Admit: 2018-09-29 | Discharge: 2018-09-29 | Disposition: A | Discharge status: Home / Self Care | Payer: Medicare Other | Source: Ambulatory Visit | Attending: Family Medicine | Admitting: Family Medicine

## 2018-09-29 DIAGNOSIS — Z1231 Encounter for screening mammogram for malignant neoplasm of breast: Secondary | ICD-10-CM

## 2018-10-09 ENCOUNTER — Other Ambulatory Visit: Payer: Self-pay | Admitting: Family Medicine

## 2018-10-16 ENCOUNTER — Telehealth: Payer: Self-pay | Admitting: Internal Medicine

## 2018-10-16 NOTE — Telephone Encounter (Signed)
Called patient. See instructions diabetic medications already have been adjusted. Patient voiced understanding. Also made aware her insurance does not require a PA from [place].

## 2018-10-16 NOTE — Telephone Encounter (Signed)
Pt called asking to speak with the person that set up her procedure with [facility] on 11/12/2018. She was letting us know that she was diabetic and also had questions regarding her insurance. Please call [phone]

## 2018-10-17 ENCOUNTER — Other Ambulatory Visit: Payer: Self-pay | Admitting: Family Medicine

## 2018-11-05 ENCOUNTER — Other Ambulatory Visit: Payer: Self-pay

## 2018-11-05 ENCOUNTER — Ambulatory Visit (INDEPENDENT_AMBULATORY_CARE_PROVIDER_SITE_OTHER): Payer: Medicare Other | Admitting: Family Medicine

## 2018-11-05 ENCOUNTER — Encounter: Payer: Self-pay | Admitting: Family Medicine

## 2018-11-05 VITALS — BP 116/70 | HR 74 | Resp 12 | Ht 61.0 in | Wt 187.0 lb

## 2018-11-05 DIAGNOSIS — Z0000 Encounter for general adult medical examination without abnormal findings: Secondary | ICD-10-CM

## 2018-11-05 NOTE — Patient Instructions (Addendum)
[name] , Thank you for taking time to come for your Medicare Wellness Visit. I appreciate your ongoing commitment to your health goals. Please review the following plan we discussed and let me know if I can assist you in the future.   Please continue to practice social distancing to keep you, your family, and our community safe.  If you must go out, please wear a Mask and practice good handwashing.  Screening recommendations/referrals: Colonoscopy: Due 2025 Mammogram: Up to date Bone Density: Completed, continue with calcium and vitamin D Recommended yearly ophthalmology/optometry visit for glaucoma screening and checkup Recommended yearly dental visit for hygiene and checkup  Vaccinations: Influenza vaccine: Call for Flu vaccine appt Pneumococcal vaccine: Completed Tdap vaccine: Due 2025 Shingles vaccine: Completed  Advanced directives: a copy has been sent in the mail, please let us know if you have any questions.  Conditions/risks identified: Falls  Next appointment: 12/17/2018   Preventive Care 40-64 Years, Female Preventive care refers to lifestyle choices and visits with your health care provider that can promote health and wellness. What does preventive care include?  A yearly physical exam. This is also called an annual well check.  Dental exams once or twice a year.  Routine eye exams. Ask your health care provider how often you should have your eyes checked.  Personal lifestyle choices, including:  Daily care of your teeth and gums.  Regular physical activity.  Eating a healthy diet.  Avoiding tobacco and drug use.  Limiting alcohol use.  Practicing safe sex.  Taking low-dose aspirin daily starting at age 44.  Taking vitamin and mineral supplements as recommended by your health care provider. What happens during an annual well check? The services and screenings done by your health care provider during your annual well check will depend on your age,  overall health, lifestyle risk factors, and family history of disease. Counseling  Your health care provider may ask you questions about your:  Alcohol use.  Tobacco use.  Drug use.  Emotional well-being.  Home and relationship well-being.  Sexual activity.  Eating habits.  Work and work Statistician.  Method of birth control.  Menstrual cycle.  Pregnancy history. Screening  You may have the following tests or measurements:  Height, weight, and BMI.  Blood pressure.  Lipid and cholesterol levels. These may be checked every 5 years, or more frequently if you are over 19 years old.  Skin check.  Lung cancer screening. You may have this screening every year starting at age 23 if you have a 30-pack-year history of smoking and currently smoke or have quit within the past 15 years.  Fecal occult blood test (FOBT) of the stool. You may have this test every year starting at age 73.  Flexible sigmoidoscopy or colonoscopy. You may have a sigmoidoscopy every 5 years or a colonoscopy every 10 years starting at age 33.  Hepatitis C blood test.  Hepatitis B blood test.  Sexually transmitted disease (STD) testing.  Diabetes screening. This is done by checking your blood sugar (glucose) after you have not eaten for a while (fasting). You may have this done every 1-3 years.  Mammogram. This may be done every 1-2 years. Talk to your health care provider about when you should start having regular mammograms. This may depend on whether you have a family history of breast cancer.  BRCA-related cancer screening. This may be done if you have a family history of breast, ovarian, tubal, or peritoneal cancers.  Pelvic exam and Pap  test. This may be done every 3 years starting at age 67. Starting at age 44, this may be done every 5 years if you have a Pap test in combination with an HPV test.  Bone density scan. This is done to screen for osteoporosis. You may have this scan if you are at  high risk for osteoporosis. Discuss your test results, treatment options, and if necessary, the need for more tests with your health care provider. Vaccines  Your health care provider may recommend certain vaccines, such as:  Influenza vaccine. This is recommended every year.  Tetanus, diphtheria, and acellular pertussis (Tdap, Td) vaccine. You may need a Td booster every 10 years.  Zoster vaccine. You may need this after age 38.  Pneumococcal 13-valent conjugate (PCV13) vaccine. You may need this if you have certain conditions and were not previously vaccinated.  Pneumococcal polysaccharide (PPSV23) vaccine. You may need one or two doses if you smoke cigarettes or if you have certain conditions. Talk to your health care provider about which screenings and vaccines you need and how often you need them. This information is not intended to replace advice given to you by your health care provider. Make sure you discuss any questions you have with your health care provider. Document Released: 03/25/2015 Document Revised: 11/16/2015 Document Reviewed: 12/28/2014 Elsevier Interactive Patient Education  2017 [place] Prevention in the Home Falls can cause injuries. They can happen to people of all ages. There are many things you can do to make your home safe and to help prevent falls. What can I do on the outside of my home?  Regularly fix the edges of walkways and driveways and fix any cracks.  Remove anything that might make you trip as you walk through a door, such as a raised step or threshold.  Trim any bushes or trees on the path to your home.  Use bright outdoor lighting.  Clear any walking paths of anything that might make someone trip, such as rocks or tools.  Regularly check to see if handrails are loose or broken. Make sure that both sides of any steps have handrails.  Any raised decks and porches should have guardrails on the edges.  Have any leaves, snow, or  ice cleared regularly.  Use sand or salt on walking paths during winter.  Clean up any spills in your garage right away. This includes oil or grease spills. What can I do in the bathroom?  Use night lights.  Install grab bars by the toilet and in the tub and shower. Do not use towel bars as grab bars.  Use non-skid mats or decals in the tub or shower.  If you need to sit down in the shower, use a plastic, non-slip stool.  Keep the floor dry. Clean up any water that spills on the floor as soon as it happens.  Remove soap buildup in the tub or shower regularly.  Attach bath mats securely with double-sided non-slip rug tape.  Do not have throw rugs and other things on the floor that can make you trip. What can I do in the bedroom?  Use night lights.  Make sure that you have a light by your bed that is easy to reach.  Do not use any sheets or blankets that are too big for your bed. They should not hang down onto the floor.  Have a firm chair that has side arms. You can use this for support while you get  dressed.  Do not have throw rugs and other things on the floor that can make you trip. What can I do in the kitchen?  Clean up any spills right away.  Avoid walking on wet floors.  Keep items that you use a lot in easy-to-reach places.  If you need to reach something above you, use a strong step stool that has a grab bar.  Keep electrical cords out of the way.  Do not use floor polish or wax that makes floors slippery. If you must use wax, use non-skid floor wax.  Do not have throw rugs and other things on the floor that can make you trip. What can I do with my stairs?  Do not leave any items on the stairs.  Make sure that there are handrails on both sides of the stairs and use them. Fix handrails that are broken or loose. Make sure that handrails are as long as the stairways.  Check any carpeting to make sure that it is firmly attached to the stairs. Fix any carpet  that is loose or worn.  Avoid having throw rugs at the top or bottom of the stairs. If you do have throw rugs, attach them to the floor with carpet tape.  Make sure that you have a light switch at the top of the stairs and the bottom of the stairs. If you do not have them, ask someone to add them for you. What else can I do to help prevent falls?  Wear shoes that:  Do not have high heels.  Have rubber bottoms.  Are comfortable and fit you well.  Are closed at the toe. Do not wear sandals.  If you use a stepladder:  Make sure that it is fully opened. Do not climb a closed stepladder.  Make sure that both sides of the stepladder are locked into place.  Ask someone to hold it for you, if possible.  Clearly mark and make sure that you can see:  Any grab bars or handrails.  First and last steps.  Where the edge of each step is.  Use tools that help you move around (mobility aids) if they are needed. These include:  Canes.  Walkers.  Scooters.  Crutches.  Turn on the lights when you go into a dark area. Replace any light bulbs as soon as they burn out.  Set up your furniture so you have a clear path. Avoid moving your furniture around.  If any of your floors are uneven, fix them.  If there are any pets around you, be aware of where they are.  Review your medicines with your doctor. Some medicines can make you feel dizzy. This can increase your chance of falling. Ask your doctor what other things that you can do to help prevent falls. This information is not intended to replace advice given to you by your health care provider. Make sure you discuss any questions you have with your health care provider. Document Released: 12/23/2008 Document Revised: 08/04/2015 Document Reviewed: 04/02/2014 Elsevier Interactive Patient Education  2017 [employer].

## 2018-11-05 NOTE — Progress Notes (Signed)
Subjective:   Michaela Peterson is a 63 y.o. female who presents for Medicare Annual (Subsequent) preventive examination.  Location of Patient: Home Location of Provider: Telehealth Consent was obtain for visit to be over via telehealth. I verified that I am speaking with the correct person using two identifiers.   Review of Systems:    Cardiac Risk Factors include: diabetes mellitus;hypertension;dyslipidemia;obesity (BMI >30kg/m2)     Objective:     Vitals: BP 116/70   Pulse 74   Resp 12   Ht 5' 1"  (1.549 m)   Wt 187 lb (84.8 kg)   LMP 08/18/2012   BMI 35.33 kg/m   Body mass index is 35.33 kg/m.  Advanced Directives 11/01/2017 07/30/2017 12/19/2016 06/19/2016 12/14/2015 08/15/2015 07/26/2015  Does Patient Have a Medical Advance Directive? No No No No No No No  Would patient like information on creating a medical advance directive? Yes (ED - Information included in AVS) - No - Patient declined No - Patient declined No - patient declined information No - patient declined information No - patient declined information  Pre-existing out of facility DNR order (yellow form or pink MOST form) - - - - - - -    Tobacco Social History   Tobacco Use  Smoking Status Never Smoker  Smokeless Tobacco Never Used  Tobacco Comment   patient lives with a smoker      Counseling given: Yes Comment: patient lives with a smoker    Clinical Intake:  Pre-visit preparation completed: Yes  Pain : 0-10 Pain Score: 4  Pain Type: Chronic pain Pain Location: Knee Pain Orientation: Right Pain Descriptors / Indicators: Aching Pain Onset: More than a month ago Pain Frequency: Intermittent Pain Relieving Factors: nothing Effect of Pain on Daily Activities: sometimes  Pain Relieving Factors: nothing  BMI - recorded: 35.33 Nutritional Status: BMI > 30  Obese Nutritional Risks: None Diabetes: Yes CBG done?: No Did pt. bring in CBG monitor from home?: No  How often do you need to have someone  help you when you read instructions, pamphlets, or other written materials from your doctor or pharmacy?: 1 - Never What is the last grade level you completed in school?: 12+ 1 year of college  Interpreter Needed?: No     Past Medical History:  Diagnosis Date  . Adnexal mass    Left; followed by Dr. [name]  . Anemia, iron deficiency   . Arthritis   . Chronic low back pain   . Diabetes mellitus, type 2 ([place])   . Essential hypertension   . Gastroparesis   . GERD (gastroesophageal reflux disease)   . MS (multiple sclerosis) ([place])   . Nonalcoholic fatty liver disease   . Obesity   . Obesity, diabetes, and hypertension syndrome ([place]) 03/12/2009   Qualifier: Diagnosis of  By: Moshe Cipro MD, Joycelyn Schmid    . Sleep apnea 2009   Past Surgical History:  Procedure Laterality Date  . AGILE CAPSULE N/A 08/11/2014   Procedure: AGILE CAPSULE;  Surgeon: Daneil Dolin, MD;  Location: [facility];  Service: Endoscopy;  Laterality: N/A;  0700  . BLADDER SUSPENSION    . CESAREAN SECTION     x2   . CHOLECYSTECTOMY    . COLONOSCOPY  2010   Dr. [name]: tubular adenoma, few scattered diverticula  . COLONOSCOPY N/A 10/01/2013   Dr. Pixie Casino preparation. Normal rectum. Normal colonic mucosa  . DILATION AND CURETTAGE OF UTERUS     4  . ESOPHAGOGASTRODUODENOSCOPY  2010  Dr. [name]: normal esophagus, small hiatal hernia, questionable pale duodenal mucosa but negative for celiac sprue.   . ESOPHAGOGASTRODUODENOSCOPY N/A 10/01/2013   Dr. Rourk:gastric polyps-status post biopsy. Otherwise, normal EGD. fundic gland polyp, negative H.pylori  . GIVENS CAPSULE STUDY N/A 09/08/2014   Poor prep but overall unremarkable   . KNEE ARTHROSCOPY W/ DEBRIDEMENT    . LAPAROSCOPY ABDOMEN DIAGNOSTIC     lysis of adhesions  . ROTATOR CUFF REPAIR    . TENDON LENGTHENING     Left wrist  . UMBILICAL HERNIA REPAIR     Family History  Problem Relation Age of Onset  . Hypertension Sister   . Diabetes Sister   . Heart  disease Sister   . Kidney failure Sister   . Heart failure Mother   . Hypertension Mother   . Arthritis Mother   . Stroke Father   . Hypertension Son   . Hypertension Sister   . Hypertension Brother   . Mental illness Brother   . Colon cancer Maternal Aunt    Social History   Socioeconomic History  . Marital status: Married    Spouse name: [name]   . Number of children: 4  . Years of education: 66  . Highest education level: Not on file  Occupational History  . Occupation: unemployed     Fish farm manager: UNEMPLOYED  Social Needs  . Financial resource strain: Somewhat hard  . Food insecurity    Worry: Never true    Inability: Never true  . Transportation needs    Medical: No    Non-medical: No  Tobacco Use  . Smoking status: Never Smoker  . Smokeless tobacco: Never Used  . Tobacco comment: patient lives with a smoker   Substance and Sexual Activity  . Alcohol use: No  . Drug use: No  . Sexual activity: Yes    Birth control/protection: Post-menopausal  Lifestyle  . Physical activity    Days per week: 0 days    Minutes per session: 0 min  . Stress: Only a little  Relationships  . Social connections    Talks on phone: More than three times a week    Gets together: Three times a week    Attends religious service: More than 4 times per year    Active member of club or organization: No    Attends meetings of clubs or organizations: Never    Relationship status: Married  Other Topics Concern  . Not on file  Social History Narrative   Patient lives at home with husband [name].    Patient has 4 children.    Patient has a some college.    Patient is right handed.    Patient is currently not working.     Outpatient Encounter Medications as of 11/05/2018  Medication Sig  . albuterol (VENTOLIN HFA) 108 (90 Base) MCG/ACT inhaler Inhale 2 puffs into the lungs every 4 (four) hours as needed for wheezing or shortness of breath.  . allopurinol (ZYLOPRIM) 100 MG tablet Take 1 tablet  (100 mg total) by mouth daily.  Marland Kitchen aspirin EC 81 MG tablet Take 81 mg by mouth daily.  Marland Kitchen azelastine (OPTIVAR) 0.05 % ophthalmic solution Place 1 drop into both eyes 2 (two) times daily.  . blood glucose meter kit and supplies Dispense based on patient and insurance preference. Use to test once daily  . diphenhydrAMINE (BENADRYL) 25 MG tablet Take 25 mg by mouth every 6 (six) hours as needed for itching.  . fluticasone (FLONASE)  50 MCG/ACT nasal spray Place 2 sprays into both nostrils 2 (two) times a day.  . fluticasone furoate-vilanterol (BREO ELLIPTA) 100-25 MCG/INH AEPB Inhale 1 puff, then rinse mouth, once daily maintenance  . glipiZIDE (GLUCOTROL) 10 MG tablet TAKE 1 TABLET BY MOUTH 2  TIMES DAILY BEFORE MEALS  . metFORMIN (GLUCOPHAGE) 1000 MG tablet TAKE 1 TABLET BY MOUTH TWO  TIMES DAILY WITH MEALS  . mometasone (NASONEX) 50 MCG/ACT nasal spray Place 2 sprays into the nose daily.  . montelukast (SINGULAIR) 10 MG tablet TAKE 1 TABLET BY MOUTH AT  BEDTIME  . NIFEdipine (ADALAT CC) 90 MG 24 hr tablet Take 1 tablet (90 mg total) by mouth daily.  Marland Kitchen nystatin (MYCOSTATIN/NYSTOP) powder Apply topically 2 (two) times daily. To irritated skin  . ONETOUCH DELICA LANCETS 93X MISC USE UP TO 4 TIMES DAILY AS  DIRECTED  . ONETOUCH ULTRA test strip USE TO TEST ONCE DAILY  . pantoprazole (PROTONIX) 40 MG tablet Take 40 mg by mouth 2 (two) times daily before a meal.  . polyethylene glycol-electrolytes (TRILYTE) 420 g solution Take 4,000 mLs by mouth as directed.  . rosuvastatin (CRESTOR) 5 MG tablet One tablet three times per week, every Monday, Wednesday and Friday  . triamterene-hydrochlorothiazide (MAXZIDE-25) 37.5-25 MG tablet Take 1 tablet by mouth daily.    No facility-administered encounter medications on file as of 11/05/2018.     Activities of Daily Living In your present state of health, do you have any difficulty performing the following activities: 11/05/2018  Hearing? N  Vision? Y   Difficulty concentrating or making decisions? Y  Comment a little bit  Walking or climbing stairs? Y  Comment sometimes with her knee  Dressing or bathing? N  Doing errands, shopping? N  Preparing Food and eating ? N  Using the Toilet? N  In the past six months, have you accidently leaked urine? N  Do you have problems with loss of bowel control? N  Managing your Medications? N  Managing your Finances? N  Housekeeping or managing your Housekeeping? N  Some recent data might be hidden    Patient Care Team: [name], MD as PCP - General Rourk, Cristopher Estimable, MD as Consulting Physician (Gastroenterology) Dennie Bible, NP as Nurse Practitioner (Family Medicine) Elsie Saas, MD as Consulting Physician (Orthopedic Surgery) Satira Sark, MD as Consulting Physician (Cardiology)    Assessment:   This is a routine wellness examination for Vincent.  Exercise Activities and Dietary recommendations Current Exercise Habits: The patient does not participate in regular exercise at present, Exercise limited by: orthopedic condition(s)  Goals   None     Fall Risk Fall Risk  11/05/2018 09/11/2018 09/04/2018 08/21/2018 05/21/2018  Falls in the past year? 1 1 1 1  0  Number falls in past yr: 0 0 0 1 -  Injury with Fall? 1 0 1 0 -   Is the patient's home free of loose throw rugs in walkways, pet beds, electrical cords, etc?   yes      Grab bars in the bathroom? yes      Handrails on the stairs?   yes      Adequate lighting?   yes    Depression Screen PHQ 2/9 Scores 11/05/2018 09/11/2018 08/21/2018 05/21/2018  PHQ - 2 Score 0 0 0 0  PHQ- 9 Score - - - 2     Cognitive Function     6CIT Screen 11/05/2018 11/01/2017  What Year? 0 points 0 points  What month? 0 points 0 points  What time? 0 points 0 points  Count back from 20 0 points 0 points  Months in reverse 0 points 0 points  Repeat phrase 0 points 0 points  Total Score 0 0    Immunization History  Administered  Date(s) Administered  . Influenza Split 01/17/2011, 01/09/2012  . Influenza Whole 12/11/2006, 12/29/2008, 11/28/2009  . Influenza,inj,Quad PF,6+ Mos 01/08/2013, 12/23/2013, 01/18/2015, 12/14/2015, 12/19/2016, 12/20/2017  . Pneumococcal Conjugate-13 10/19/2013  . Pneumococcal Polysaccharide-23 11/30/2003, 08/30/2010  . Td 11/30/2003  . Tdap 12/23/2013  . Zoster 02/29/2016    Qualifies for Shingles Vaccine? completed  Screening Tests Health Maintenance  Topic Date Due  . INFLUENZA VACCINE  10/11/2018  . OPHTHALMOLOGY EXAM  01/21/2019  . FOOT EXAM  02/20/2019  . URINE MICROALBUMIN  02/20/2019  . HEMOGLOBIN A1C  02/21/2019  . MAMMOGRAM  09/28/2020  . PAP SMEAR-Modifier  02/19/2021  . COLONOSCOPY  10/02/2023  . TETANUS/TDAP  12/24/2023  . PNEUMOCOCCAL POLYSACCHARIDE VACCINE AGE 23-64 HIGH RISK  Completed  . Hepatitis C Screening  Completed  . HIV Screening  Completed    Cancer Screenings: Lung: Low Dose CT Chest recommended if Age 99-80 years, 30 pack-year currently smoking OR have quit w/in 15years. Patient does not qualify. Breast:  Up to date on Mammogram? Yes   Up to date of Bone Density/Dexa? N/a due in next few years Colorectal: Sept 2020-appt made  Additional Screenings:   Hepatitis C Screening: completed     Plan:       1. Encounter for Medicare annual wellness exam  I have personally reviewed and noted the following in the patient's chart:   . Medical and social history . Use of alcohol, tobacco or illicit drugs  . Current medications and supplements . Functional ability and status . Nutritional status . Physical activity . Advanced directives . List of other physicians . Hospitalizations, surgeries, and ER visits in previous 12 months . Vitals . Screenings to include cognitive, depression, and falls . Referrals and appointments  In addition, I have reviewed and discussed with patient certain preventive protocols, quality metrics, and best practice  recommendations. A written personalized care plan for preventive services as well as general preventive health recommendations were provided to patient.   I provided 20 minutes of non-face-to-face time during this encounter.   Perlie Mayo, NP  11/05/2018

## 2018-11-10 ENCOUNTER — Other Ambulatory Visit (HOSPITAL_COMMUNITY)
Admission: RE | Admit: 2018-11-10 | Discharge: 2018-11-10 | Disposition: A | Discharge status: Home / Self Care | Payer: Medicare Other | Source: Ambulatory Visit | Attending: Internal Medicine | Admitting: Internal Medicine

## 2018-11-10 ENCOUNTER — Other Ambulatory Visit: Payer: Self-pay

## 2018-11-10 DIAGNOSIS — Z20828 Contact with and (suspected) exposure to other viral communicable diseases: Secondary | ICD-10-CM | POA: Diagnosis not present

## 2018-11-10 DIAGNOSIS — Z01812 Encounter for preprocedural laboratory examination: Secondary | ICD-10-CM | POA: Diagnosis not present

## 2018-11-10 DIAGNOSIS — K635 Polyp of colon: Secondary | ICD-10-CM | POA: Diagnosis not present

## 2018-11-10 DIAGNOSIS — K5790 Diverticulosis of intestine, part unspecified, without perforation or abscess without bleeding: Secondary | ICD-10-CM | POA: Diagnosis not present

## 2018-11-10 LAB — SARS CORONAVIRUS 2 (TAT 6-24 HRS): SARS Coronavirus 2: NEGATIVE

## 2018-11-12 ENCOUNTER — Other Ambulatory Visit: Payer: Self-pay

## 2018-11-12 ENCOUNTER — Encounter (HOSPITAL_COMMUNITY)
Admission: RE | Disposition: A | Discharge status: Home / Self Care | Payer: Self-pay | Source: Home / Self Care | Attending: Internal Medicine

## 2018-11-12 ENCOUNTER — Ambulatory Visit (HOSPITAL_COMMUNITY)
Admission: RE | Admit: 2018-11-12 | Discharge: 2018-11-12 | Disposition: A | Discharge status: Home / Self Care | Payer: Medicare Other | Attending: Internal Medicine | Admitting: Internal Medicine

## 2018-11-12 ENCOUNTER — Encounter (HOSPITAL_COMMUNITY): Payer: Self-pay | Admitting: *Deleted

## 2018-11-12 DIAGNOSIS — Z888 Allergy status to other drugs, medicaments and biological substances status: Secondary | ICD-10-CM | POA: Insufficient documentation

## 2018-11-12 DIAGNOSIS — Z800 Family history of malignant neoplasm of digestive organs: Secondary | ICD-10-CM | POA: Insufficient documentation

## 2018-11-12 DIAGNOSIS — K3184 Gastroparesis: Secondary | ICD-10-CM | POA: Diagnosis not present

## 2018-11-12 DIAGNOSIS — Z79899 Other long term (current) drug therapy: Secondary | ICD-10-CM | POA: Diagnosis not present

## 2018-11-12 DIAGNOSIS — K760 Fatty (change of) liver, not elsewhere classified: Secondary | ICD-10-CM | POA: Insufficient documentation

## 2018-11-12 DIAGNOSIS — Z7982 Long term (current) use of aspirin: Secondary | ICD-10-CM | POA: Diagnosis not present

## 2018-11-12 DIAGNOSIS — Z833 Family history of diabetes mellitus: Secondary | ICD-10-CM | POA: Diagnosis not present

## 2018-11-12 DIAGNOSIS — Z8249 Family history of ischemic heart disease and other diseases of the circulatory system: Secondary | ICD-10-CM | POA: Insufficient documentation

## 2018-11-12 DIAGNOSIS — Z9049 Acquired absence of other specified parts of digestive tract: Secondary | ICD-10-CM | POA: Diagnosis not present

## 2018-11-12 DIAGNOSIS — Z8261 Family history of arthritis: Secondary | ICD-10-CM | POA: Insufficient documentation

## 2018-11-12 DIAGNOSIS — I10 Essential (primary) hypertension: Secondary | ICD-10-CM | POA: Insufficient documentation

## 2018-11-12 DIAGNOSIS — E1143 Type 2 diabetes mellitus with diabetic autonomic (poly)neuropathy: Secondary | ICD-10-CM | POA: Diagnosis not present

## 2018-11-12 DIAGNOSIS — Z1211 Encounter for screening for malignant neoplasm of colon: Secondary | ICD-10-CM | POA: Insufficient documentation

## 2018-11-12 DIAGNOSIS — M545 Low back pain: Secondary | ICD-10-CM | POA: Diagnosis not present

## 2018-11-12 DIAGNOSIS — K5730 Diverticulosis of large intestine without perforation or abscess without bleeding: Secondary | ICD-10-CM | POA: Diagnosis not present

## 2018-11-12 DIAGNOSIS — K219 Gastro-esophageal reflux disease without esophagitis: Secondary | ICD-10-CM | POA: Insufficient documentation

## 2018-11-12 DIAGNOSIS — Z6835 Body mass index (BMI) 35.0-35.9, adult: Secondary | ICD-10-CM | POA: Insufficient documentation

## 2018-11-12 DIAGNOSIS — Z86010 Personal history of colonic polyps: Secondary | ICD-10-CM | POA: Insufficient documentation

## 2018-11-12 DIAGNOSIS — Z791 Long term (current) use of non-steroidal anti-inflammatories (NSAID): Secondary | ICD-10-CM | POA: Diagnosis not present

## 2018-11-12 DIAGNOSIS — G4730 Sleep apnea, unspecified: Secondary | ICD-10-CM | POA: Diagnosis not present

## 2018-11-12 DIAGNOSIS — D509 Iron deficiency anemia, unspecified: Secondary | ICD-10-CM | POA: Diagnosis not present

## 2018-11-12 DIAGNOSIS — Z880 Allergy status to penicillin: Secondary | ICD-10-CM | POA: Insufficient documentation

## 2018-11-12 DIAGNOSIS — Z885 Allergy status to narcotic agent status: Secondary | ICD-10-CM | POA: Insufficient documentation

## 2018-11-12 DIAGNOSIS — Z818 Family history of other mental and behavioral disorders: Secondary | ICD-10-CM | POA: Insufficient documentation

## 2018-11-12 DIAGNOSIS — G8929 Other chronic pain: Secondary | ICD-10-CM | POA: Insufficient documentation

## 2018-11-12 DIAGNOSIS — Z7984 Long term (current) use of oral hypoglycemic drugs: Secondary | ICD-10-CM | POA: Insufficient documentation

## 2018-11-12 DIAGNOSIS — M1990 Unspecified osteoarthritis, unspecified site: Secondary | ICD-10-CM | POA: Insufficient documentation

## 2018-11-12 DIAGNOSIS — G35 Multiple sclerosis: Secondary | ICD-10-CM | POA: Insufficient documentation

## 2018-11-12 DIAGNOSIS — Z7951 Long term (current) use of inhaled steroids: Secondary | ICD-10-CM | POA: Insufficient documentation

## 2018-11-12 DIAGNOSIS — E669 Obesity, unspecified: Secondary | ICD-10-CM | POA: Insufficient documentation

## 2018-11-12 DIAGNOSIS — D122 Benign neoplasm of ascending colon: Secondary | ICD-10-CM | POA: Insufficient documentation

## 2018-11-12 DIAGNOSIS — Z886 Allergy status to analgesic agent status: Secondary | ICD-10-CM | POA: Insufficient documentation

## 2018-11-12 DIAGNOSIS — Z841 Family history of disorders of kidney and ureter: Secondary | ICD-10-CM | POA: Insufficient documentation

## 2018-11-12 DIAGNOSIS — K635 Polyp of colon: Secondary | ICD-10-CM | POA: Diagnosis not present

## 2018-11-12 HISTORY — PX: POLYPECTOMY: SHX5525

## 2018-11-12 HISTORY — PX: COLONOSCOPY: SHX5424

## 2018-11-12 LAB — GLUCOSE, CAPILLARY: Glucose-Capillary: 146 mg/dL — ABNORMAL HIGH (ref 70–99)

## 2018-11-12 SURGERY — COLONOSCOPY
Anesthesia: Moderate Sedation

## 2018-11-12 MED ORDER — ONDANSETRON HCL 4 MG/2ML IJ SOLN
INTRAMUSCULAR | Status: DC | PRN
  Administered 2018-11-12: 4 mg via INTRAVENOUS

## 2018-11-12 MED ORDER — MEPERIDINE HCL 50 MG/ML IJ SOLN
INTRAMUSCULAR | Status: AC
  Filled 2018-11-12: qty 1

## 2018-11-12 MED ORDER — STERILE WATER FOR IRRIGATION IR SOLN
Status: DC | PRN
  Administered 2018-11-12: 1.5 mL

## 2018-11-12 MED ORDER — MIDAZOLAM HCL 5 MG/5ML IJ SOLN
INTRAMUSCULAR | Status: AC
  Filled 2018-11-12: qty 10

## 2018-11-12 MED ORDER — MEPERIDINE HCL 100 MG/ML IJ SOLN
INTRAMUSCULAR | Status: DC | PRN
  Administered 2018-11-12: 15 mg via INTRAVENOUS
  Administered 2018-11-12: 10 mg via INTRAVENOUS
  Administered 2018-11-12: 25 mg via INTRAVENOUS

## 2018-11-12 MED ORDER — ONDANSETRON HCL 4 MG/2ML IJ SOLN
INTRAMUSCULAR | Status: AC
  Filled 2018-11-12: qty 2

## 2018-11-12 MED ORDER — MIDAZOLAM HCL 5 MG/5ML IJ SOLN
INTRAMUSCULAR | Status: DC | PRN
  Administered 2018-11-12: 2 mg via INTRAVENOUS
  Administered 2018-11-12 (×4): 1 mg via INTRAVENOUS

## 2018-11-12 MED ORDER — SODIUM CHLORIDE 0.9 % IV SOLN
INTRAVENOUS | Status: DC
  Administered 2018-11-12: 10:00:00 via INTRAVENOUS

## 2018-11-12 NOTE — Discharge Instructions (Signed)
Colonoscopy Discharge Instructions  Read the instructions outlined below and refer to this sheet in the next few weeks. These discharge instructions provide you with general information on caring for yourself after you leave the hospital. Your doctor may also give you specific instructions. While your treatment has been planned according to the most current medical practices available, unavoidable complications occasionally occur. If you have any problems or questions after discharge, call Dr. [name] at [phone]. ACTIVITY  You may resume your regular activity, but move at a slower pace for the next 24 hours.   Take frequent rest periods for the next 24 hours.   Walking will help get rid of the air and reduce the bloated feeling in your belly (abdomen).   No driving for 24 hours (because of the medicine (anesthesia) used during the test).    Do not sign any important legal documents or operate any machinery for 24 hours (because of the anesthesia used during the test).  NUTRITION  Drink plenty of fluids.   You may resume your normal diet as instructed by your doctor.   Begin with a light meal and progress to your normal diet. Heavy or fried foods are harder to digest and may make you feel sick to your stomach (nauseated).   Avoid alcoholic beverages for 24 hours or as instructed.  MEDICATIONS  You may resume your normal medications unless your doctor tells you otherwise.  WHAT YOU CAN EXPECT TODAY  Some feelings of bloating in the abdomen.   Passage of more gas than usual.   Spotting of blood in your stool or on the toilet paper.  IF YOU HAD POLYPS REMOVED DURING THE COLONOSCOPY:  No aspirin products for 7 days or as instructed.   No alcohol for 7 days or as instructed.   Eat a soft diet for the next 24 hours.  FINDING OUT THE RESULTS OF YOUR TEST Not all test results are available during your visit. If your test results are not back during the visit, make an appointment  with your caregiver to find out the results. Do not assume everything is normal if you have not heard from your caregiver or the medical facility. It is important for you to follow up on all of your test results.  SEEK IMMEDIATE MEDICAL ATTENTION IF:  You have more than a spotting of blood in your stool.   Your belly is swollen (abdominal distention).   You are nauseated or vomiting.   You have a temperature over 101.   You have abdominal pain or discomfort that is severe or gets worse throughout the day.    Colon diverticulosis and polyp information provided  Further recommendations to follow pending review of pathology report  Called Harrie Jeans at patient [phone].  Got voicemail.     Diverticulosis  Diverticulosis is a condition that develops when small pouches (diverticula) form in the wall of the large intestine (colon). The colon is where water is absorbed and stool is formed. The pouches form when the inside layer of the colon pushes through weak spots in the outer layers of the colon. You may have a few pouches or many of them. What are the causes? The cause of this condition is not known. What increases the risk? The following factors may make you more likely to develop this condition:  Being older than age 21. Your risk for this condition increases with age. Diverticulosis is rare among people younger than age 23. By age 32, many people have  it.  Eating a low-fiber diet.  Having frequent constipation.  Being overweight.  Not getting enough exercise.  Smoking.  Taking over-the-counter pain medicines, like aspirin and ibuprofen.  Having a family history of diverticulosis. What are the signs or symptoms? In most people, there are no symptoms of this condition. If you do have symptoms, they may include:  Bloating.  Cramps in the abdomen.  Constipation or diarrhea.  Pain in the lower left side of the abdomen. How is this diagnosed? This  condition is most often diagnosed during an exam for other colon problems. Because diverticulosis usually has no symptoms, it often cannot be diagnosed independently. This condition may be diagnosed by:  Using a flexible scope to examine the colon (colonoscopy).  Taking an X-ray of the colon after dye has been put into the colon (barium enema).  Doing a CT scan. How is this treated? You may not need treatment for this condition if you have never developed an infection related to diverticulosis. If you have had an infection before, treatment may include:  Eating a high-fiber diet. This may include eating more fruits, vegetables, and grains.  Taking a fiber supplement.  Taking a live bacteria supplement (probiotic).  Taking medicine to relax your colon.  Taking antibiotic medicines. Follow these instructions at home:  Drink 6-8 glasses of water or more each day to prevent constipation.  Try not to strain when you have a bowel movement.  If you have had an infection before: ? Eat more fiber as directed by your health care provider or your diet and nutrition specialist (dietitian). ? Take a fiber supplement or probiotic, if your health care provider approves.  Take over-the-counter and prescription medicines only as told by your health care provider.  If you were prescribed an antibiotic, take it as told by your health care provider. Do not stop taking the antibiotic even if you start to feel better.  Keep all follow-up visits as told by your health care provider. This is important. Contact a health care provider if:  You have pain in your abdomen.  You have bloating.  You have cramps.  You have not had a bowel movement in 3 days. Get help right away if:  Your pain gets worse.  Your bloating becomes very bad.  You have a fever or chills, and your symptoms suddenly get worse.  You vomit.  You have bowel movements that are bloody or black.  You have bleeding from your  rectum. Summary  Diverticulosis is a condition that develops when small pouches (diverticula) form in the wall of the large intestine (colon).  You may have a few pouches or many of them.  This condition is most often diagnosed during an exam for other colon problems.  If you have had an infection related to diverticulosis, treatment may include increasing the fiber in your diet, taking supplements, or taking medicines. This information is not intended to replace advice given to you by your health care provider. Make sure you discuss any questions you have with your health care provider. Document Released: 11/24/2003 Document Revised: 02/08/2017 Document Reviewed: 01/16/2016 Elsevier Patient Education  [place].    Colon Polyps  Polyps are tissue growths inside the body. Polyps can grow in many places, including the large intestine (colon). A polyp may be a round bump or a mushroom-shaped growth. You could have one polyp or several. Most colon polyps are noncancerous (benign). However, some colon polyps can become cancerous over time. Finding and  removing the polyps early can help prevent this. What are the causes? The exact cause of colon polyps is not known. What increases the risk? You are more likely to develop this condition if you:  Have a family history of colon cancer or colon polyps.  Are older than 72 or older than 45 if you are African American.  Have inflammatory bowel disease, such as ulcerative colitis or Crohn's disease.  Have certain hereditary conditions, such as: ? Familial adenomatous polyposis. ? Lynch syndrome. ? Turcot syndrome. ? Peutz-Jeghers syndrome.  Are overweight.  Smoke cigarettes.  Do not get enough exercise.  Drink too much alcohol.  Eat a diet that is high in fat and red meat and low in fiber.  Had childhood cancer that was treated with abdominal radiation. What are the signs or symptoms? Most polyps do not cause  symptoms. If you have symptoms, they may include:  Blood coming from your rectum when having a bowel movement.  Blood in your stool. The stool may look dark red or black.  Abdominal pain.  A change in bowel habits, such as constipation or diarrhea. How is this diagnosed? This condition is diagnosed with a colonoscopy. This is a procedure in which a lighted, flexible scope is inserted into the anus and then passed into the colon to examine the area. Polyps are sometimes found when a colonoscopy is done as part of routine cancer screening tests. How is this treated? Treatment for this condition involves removing any polyps that are found. Most polyps can be removed during a colonoscopy. Those polyps will then be tested for cancer. Additional treatment may be needed depending on the results of testing. Follow these instructions at home: Lifestyle  Maintain a healthy weight, or lose weight if recommended by your health care provider.  Exercise every day or as told by your health care provider.  Do not use any products that contain nicotine or tobacco, such as cigarettes and e-cigarettes. If you need help quitting, ask your health care provider.  If you drink alcohol, limit how much you have: ? 0-1 drink a day for women. ? 0-2 drinks a day for men.  Be aware of how much alcohol is in your drink. In the [place] one drink equals one 12 oz bottle of beer (355 mL), one 5 oz glass of wine (148 mL), or one 1 oz shot of hard liquor (44 mL). Eating and drinking   Eat foods that are high in fiber, such as fruits, vegetables, and whole grains.  Eat foods that are high in calcium and vitamin D, such as milk, cheese, yogurt, eggs, liver, fish, and broccoli.  Limit foods that are high in fat, such as fried foods and desserts.  Limit the amount of red meat and processed meat you eat, such as hot dogs, sausage, bacon, and lunch meats. General instructions  Keep all follow-up visits as told by your  health care provider. This is important. ? This includes having regularly scheduled colonoscopies. ? Talk to your health care provider about when you need a colonoscopy. Contact a health care provider if:  You have new or worsening bleeding during a bowel movement.  You have new or increased blood in your stool.  You have a change in bowel habits.  You lose weight for no known reason. Summary  Polyps are tissue growths inside the body. Polyps can grow in many places, including the colon.  Most colon polyps are noncancerous (benign), but some can become cancerous over  time.  This condition is diagnosed with a colonoscopy.  Treatment for this condition involves removing any polyps that are found. Most polyps can be removed during a colonoscopy. This information is not intended to replace advice given to you by your health care provider. Make sure you discuss any questions you have with your health care provider. Document Released: 11/23/2003 Document Revised: 06/13/2017 Document Reviewed: 06/13/2017 Elsevier Patient Education  2020 [employer].

## 2018-11-12 NOTE — Op Note (Signed)
[facility] Patient Name: Michaela Peterson Procedure Date: 11/12/2018 10:03 AM MRN: [id number] Date of Birth: 01-29-1956 Attending MD: Gennette Pac , MD CSN: [id number] Age: 63 Admit Type: Outpatient Procedure:                Colonoscopy Indications:              High risk colon cancer surveillance: Personal                            history of colonic polyps Providers:                Gennette Pac, MD, Sterling Big, RN,                            Burke Keels, Technician Referring MD:              Medicines:                Midazolam 6 mg IV, Meperidine 50 mg IV Complications:            No immediate complications. Estimated Blood Loss:     Estimated blood loss was minimal. Procedure:                Pre-Anesthesia Assessment:                           - Prior to the procedure, a History and Physical                            was performed, and patient medications and                            allergies were reviewed. The patient's tolerance of                            previous anesthesia was also reviewed. The risks                            and benefits of the procedure and the sedation                            options and risks were discussed with the patient.                            All questions were answered, and informed consent                            was obtained. Prior Anticoagulants: The patient has                            taken no previous anticoagulant or antiplatelet                            agents. ASA Grade Assessment: II - A patient with  mild systemic disease. After reviewing the risks                            and benefits, the patient was deemed in                            satisfactory condition to undergo the procedure.                           After obtaining informed consent, the colonoscope                            was passed under direct vision. Throughout the   procedure, the patient's blood pressure, pulse, and                            oxygen saturations were monitored continuously. The                            CF-[id number] ([id number]) scope was introduced through                            the anus and advanced to the the cecum, identified                            by appendiceal orifice and ileocecal valve. The                            colonoscopy was performed without difficulty. The                            patient tolerated the procedure well. The quality                            of the bowel preparation was adequate. Scope In: 10:43:37 AM Scope Out: 11:01:50 AM Scope Withdrawal Time: 0 hours 8 minutes 22 seconds  Total Procedure Duration: 0 hours 18 minutes 13 seconds  Findings:      The perianal and digital rectal examinations were normal.      Scattered medium-mouthed diverticula were found in the sigmoid colon and       descending colon.      Two sessile polyps were found in the ascending colon. The polyps were 4       to 5 mm in size. These polyps were removed with a cold snare. Resection       and retrieval were complete. Estimated blood loss was minimal.      The exam was otherwise without abnormality on direct and retroflexion       views. Impression:               - Diverticulosis in the sigmoid colon and in the                            descending colon.                           -  Two 4 to 5 mm polyps in the ascending colon,                            removed with a cold snare. Resected and retrieved.                           - The examination was otherwise normal on direct                            and retroflexion views. Moderate Sedation:      Moderate (conscious) sedation was administered by the endoscopy nurse       and supervised by the endoscopist. The following parameters were       monitored: oxygen saturation, heart rate, blood pressure, respiratory       rate, EKG, adequacy of pulmonary ventilation, and  response to care.       Total physician intraservice time was 27 minutes. Recommendation:           - Patient has a contact number available for                            emergencies. The signs and symptoms of potential                            delayed complications were discussed with the                            patient. Return to normal activities tomorrow.                            Written discharge instructions were provided to the                            patient.                           - Resume previous diet.                           - Continue present medications.                           - Repeat colonoscopy date to be determined after                            pending pathology results are reviewed for                            surveillance.                           - Return to GI office after studies are complete. Procedure Code(s):        --- Professional ---                           [phone], Colonoscopy, flexible; with removal of  tumor(s), polyp(s), or other lesion(s) by snare                            technique                           99153, Moderate sedation; each additional 15                            minutes intraservice time                           G0500, Moderate sedation services provided by the                            same physician or other qualified health care                            professional performing a gastrointestinal                            endoscopic service that sedation supports,                            requiring the presence of an independent trained                            observer to assist in the monitoring of the                            patient's level of consciousness and physiological                            status; initial 15 minutes of intra-service time;                            patient age 47 years or older (additional time may                            be reported with  [phone], as appropriate) Diagnosis Code(s):        --- Professional ---                           Z86.010, Personal history of colonic polyps                           K63.5, Polyp of colon                           K57.30, Diverticulosis of large intestine without                            perforation or abscess without bleeding CPT copyright 2019 American Medical Association. All rights reserved. The codes documented in this report are preliminary and upon coder review may  be revised to meet current compliance  requirements. Michaela Peterson. Michaela Cleverly, MD Gennette Pac, MD 11/12/2018 11:09:41 AM This report has been signed electronically. Number of Addenda: 0

## 2018-11-12 NOTE — H&P (Signed)
@LOGO @   Primary Care Physician:  [name], MD Primary Gastroenterologist:  Dr. [name]  Pre-Procedure History & Physical: HPI:  Michaela Peterson is a 63 y.o. female here for a surveillance colonoscopy.  History of colonic adenoma in the past.  Negative colonoscopy 5 years ago.   Past Medical History:  Diagnosis Date  . Adnexal mass    Left; followed by Dr. [name]  . Anemia, iron deficiency   . Arthritis   . Chronic low back pain   . Diabetes mellitus, type 2 ([place])   . Essential hypertension   . Gastroparesis   . GERD (gastroesophageal reflux disease)   . MS (multiple sclerosis) ([place])   . Nonalcoholic fatty liver disease   . Obesity   . Obesity, diabetes, and hypertension syndrome ([place]) 03/12/2009   Qualifier: Diagnosis of  By: Moshe Cipro MD, Joycelyn Schmid    . Sleep apnea 2009    Past Surgical History:  Procedure Laterality Date  . AGILE CAPSULE N/A 08/11/2014   Procedure: AGILE CAPSULE;  Surgeon: Daneil Dolin, MD;  Location: [facility];  Service: Endoscopy;  Laterality: N/A;  0700  . BLADDER SUSPENSION    . CESAREAN SECTION     x2   . CHOLECYSTECTOMY    . COLONOSCOPY  2010   Dr. [name]: tubular adenoma, few scattered diverticula  . COLONOSCOPY N/A 10/01/2013   Dr. Pixie Casino preparation. Normal rectum. Normal colonic mucosa  . DILATION AND CURETTAGE OF UTERUS     4  . ESOPHAGOGASTRODUODENOSCOPY  2010   Dr. [name]: normal esophagus, small hiatal hernia, questionable pale duodenal mucosa but negative for celiac sprue.   . ESOPHAGOGASTRODUODENOSCOPY N/A 10/01/2013   Dr. Agustina Witzke:gastric polyps-status post biopsy. Otherwise, normal EGD. fundic gland polyp, negative H.pylori  . GIVENS CAPSULE STUDY N/A 09/08/2014   Poor prep but overall unremarkable   . KNEE ARTHROSCOPY W/ DEBRIDEMENT    . LAPAROSCOPY ABDOMEN DIAGNOSTIC     lysis of adhesions  . ROTATOR CUFF REPAIR    . TENDON LENGTHENING     Left wrist  . UMBILICAL HERNIA REPAIR      Prior to Admission medications    Medication Sig Start Date End Date Taking? Authorizing Provider  albuterol (VENTOLIN HFA) 108 (90 Base) MCG/ACT inhaler Inhale 2 puffs into the lungs every 4 (four) hours as needed for wheezing or shortness of breath. 09/11/18  Yes Perlie Mayo, NP  allopurinol (ZYLOPRIM) 100 MG tablet Take 1 tablet (100 mg total) by mouth daily. 08/26/18  Yes [name], MD  aspirin EC 81 MG tablet Take 81 mg by mouth daily.   Yes [provider]  azelastine (ASTELIN) 0.1 % nasal spray Place 2 sprays into both nostrils at bedtime. Use in each nostril as directed   Yes [provider]  azelastine (OPTIVAR) 0.05 % ophthalmic solution Place 1 drop into both eyes 2 (two) times daily. 03/28/18  Yes [name], MD  cetirizine (ZYRTEC) 10 MG tablet Take 10 mg by mouth daily.   Yes [provider]  Cholecalciferol (DIALYVITE VITAMIN D 5000) 125 MCG (5000 UT) capsule Take 5,000 Units by mouth 3 (three) times a week.   Yes [provider]  fluticasone (FLONASE) 50 MCG/ACT nasal spray Place 2 sprays into both nostrils daily.    Yes [provider]  glipiZIDE (GLUCOTROL) 10 MG tablet TAKE 1 TABLET BY MOUTH 2  TIMES DAILY BEFORE MEALS Patient taking differently: Take 10 mg by mouth 2 (two) times daily before  a meal.  08/26/18  Yes [name], MD  ibuprofen (ADVIL) 200 MG tablet Take 400 mg by mouth every 6 (six) hours as needed for headache or moderate pain.   Yes [provider]  Magnesium 250 MG TABS Take 250 mg by mouth 3 (three) times a week.   Yes [provider]  metFORMIN (GLUCOPHAGE) 1000 MG tablet TAKE 1 TABLET BY MOUTH TWO  TIMES DAILY WITH MEALS Patient taking differently: Take 1,000 mg by mouth 2 (two) times daily.  10/09/18  Yes [name], MD  montelukast (SINGULAIR) 10 MG tablet TAKE 1 TABLET BY MOUTH AT  BEDTIME Patient taking differently: Take 10 mg by mouth at bedtime.  10/09/18  Yes [name], MD   NIFEdipine (ADALAT CC) 90 MG 24 hr tablet Take 1 tablet (90 mg total) by mouth daily. 08/26/18  Yes [name], MD  pantoprazole (PROTONIX) 40 MG tablet Take 40 mg by mouth 2 (two) times daily as needed (acid reflux).    Yes [provider]  polyethylene glycol-electrolytes (TRILYTE) 420 g solution Take 4,000 mLs by mouth as directed. 09/19/18  Yes Daneil Dolin, MD  rosuvastatin (CRESTOR) 5 MG tablet One tablet three times per week, every Monday, Wednesday and Friday Patient taking differently: Take 5 mg by mouth 2 (two) times a week.  02/19/18  Yes [name], MD  triamcinolone cream (KENALOG) 0.1 % Apply 1 application topically daily as needed (rash).   Yes [provider]  triamterene-hydrochlorothiazide (MAXZIDE-25) 37.5-25 MG tablet Take 1 tablet by mouth daily.  08/26/18  Yes [provider]  blood glucose meter kit and supplies Dispense based on patient and insurance preference. Use to test once daily 08/29/18   [name], MD  diphenhydrAMINE (BENADRYL) 25 MG tablet Take 25 mg by mouth every 6 (six) hours as needed for itching.    [provider]  fluticasone furoate-vilanterol (BREO ELLIPTA) 100-25 MCG/INH AEPB Inhale 1 puff, then rinse mouth, once daily maintenance 09/18/18   Baird Lyons D, MD  nystatin (MYCOSTATIN/NYSTOP) powder Apply topically 2 (two) times daily. To irritated skin 09/04/18   Jonnie Kind, MD  [facility] DELICA LANCETS 11B MISC USE UP TO 4 TIMES DAILY AS  DIRECTED 05/02/17   [name], MD  [facility] ULTRA test strip USE TO TEST ONCE DAILY 10/17/18   [name], MD  OVER THE COUNTER MEDICATION Apply 1 application topically daily as needed (pain). horse liniment cream    [provider]    Allergies as of 09/19/2018 - Review Complete 09/18/2018  Allergen Reaction Noted  . Gabapentin Shortness Of Breath 02/13/2017  . Ace inhibitors Cough 03/26/2012  . Aspirin    .  Oxycodone-acetaminophen Hives 02/21/2007  . Penicillins Hives   . Rebif [interferon beta-1a] Other (See Comments) 05/01/2018  . Statins  10/11/2012    Family History  Problem Relation Age of Onset  . Hypertension Sister   . Diabetes Sister   . Heart disease Sister   . Kidney failure Sister   . Heart failure Mother   . Hypertension Mother   . Arthritis Mother   . Stroke Father   . Hypertension Son   . Hypertension Sister   . Hypertension Brother   . Mental illness Brother   . Colon cancer Maternal Aunt     Social History   Socioeconomic History  . Marital status: Married    Spouse name: [name]   . Number of children:  4  . Years of education: 60  . Highest education level: Not on file  Occupational History  . Occupation: unemployed     Fish farm manager: UNEMPLOYED  Social Needs  . Financial resource strain: Somewhat hard  . Food insecurity    Worry: Never true    Inability: Never true  . Transportation needs    Medical: No    Non-medical: No  Tobacco Use  . Smoking status: Never Smoker  . Smokeless tobacco: Never Used  . Tobacco comment: patient lives with a smoker   Substance and Sexual Activity  . Alcohol use: No  . Drug use: No  . Sexual activity: Yes    Birth control/protection: Post-menopausal  Lifestyle  . Physical activity    Days per week: 0 days    Minutes per session: 0 min  . Stress: Only a little  Relationships  . Social connections    Talks on phone: More than three times a week    Gets together: Three times a week    Attends religious service: More than 4 times per year    Active member of club or organization: No    Attends meetings of clubs or organizations: Never    Relationship status: Married  . Intimate partner violence    Fear of current or ex partner: No    Emotionally abused: No    Physically abused: No    Forced sexual activity: No  Other Topics Concern  . Not on file  Social History Narrative   Patient lives at home with husband  [name].    Patient has 4 children.    Patient has a some college.    Patient is right handed.    Patient is currently not working.     Review of Systems: See HPI, otherwise negative ROS  Physical Exam: BP (!) 154/68   Pulse 95   Temp 98.3 F (36.8 C) (Oral)   Resp 14   Ht 5' 1"  (1.549 m)   Wt 84.8 kg   LMP 08/18/2012   SpO2 100%   BMI 35.33 kg/m  General:   Alert,  Well-developed, well-nourished, pleasant and cooperative in NAD Neck:  Supple; no masses or thyromegaly. No significant cervical adenopathy. Lungs:  Clear throughout to auscultation.   No wheezes, crackles, or rhonchi. No acute distress. Heart:  Regular rate and rhythm; no murmurs, clicks, rubs,  or gallops. Abdomen: Non-distended, normal bowel sounds.  Soft and nontender without appreciable mass or hepatosplenomegaly.  Pulses:  Normal pulses noted. Extremities:  Without clubbing or edema.  Impression/Plan: 63 year old lady with a history of colonic adenoma.  Here for surveillance colonoscopy  The risks, benefits, limitations, alternatives and imponderables have been reviewed with the patient. Questions have been answered. All parties are agreeable.      Notice: This dictation was prepared with Dragon dictation along with smaller phrase technology. Any transcriptional errors that result from this process are unintentional and may not be corrected upon review.

## 2018-11-13 ENCOUNTER — Encounter: Payer: Self-pay | Admitting: Internal Medicine

## 2018-11-14 ENCOUNTER — Encounter (HOSPITAL_COMMUNITY): Payer: Self-pay | Admitting: Internal Medicine

## 2018-12-10 DIAGNOSIS — E1149 Type 2 diabetes mellitus with other diabetic neurological complication: Secondary | ICD-10-CM | POA: Diagnosis not present

## 2018-12-10 DIAGNOSIS — I10 Essential (primary) hypertension: Secondary | ICD-10-CM | POA: Diagnosis not present

## 2018-12-10 DIAGNOSIS — E785 Hyperlipidemia, unspecified: Secondary | ICD-10-CM | POA: Diagnosis not present

## 2018-12-11 LAB — COMPLETE METABOLIC PANEL WITH GFR
AG Ratio: 1.4 (calc) (ref 1.0–2.5)
ALT: 24 U/L (ref 6–29)
AST: 18 U/L (ref 10–35)
Albumin: 4.4 g/dL (ref 3.6–5.1)
Alkaline phosphatase (APISO): 79 U/L (ref 37–153)
BUN: 22 mg/dL (ref 7–25)
CO2: 25 mmol/L (ref 20–32)
Calcium: 9.9 mg/dL (ref 8.6–10.4)
Chloride: 105 mmol/L (ref 98–110)
Creat: 0.97 mg/dL (ref 0.50–0.99)
GFR, Est African American: 72 mL/min/{1.73_m2} (ref >=60)
GFR, Est Non African American: 62 mL/min/{1.73_m2} (ref >=60)
Globulin: 3.2 g/dL (calc) (ref 1.9–3.7)
Glucose, Bld: 94 mg/dL (ref 65–99)
Potassium: 4.3 mmol/L (ref 3.5–5.3)
Sodium: 140 mmol/L (ref 135–146)
Total Bilirubin: 0.5 mg/dL (ref 0.2–1.2)
Total Protein: 7.6 g/dL (ref 6.1–8.1)

## 2018-12-11 LAB — LIPID PANEL
Cholesterol: 148 mg/dL (ref <200)
HDL: 57 mg/dL (ref >=50)
LDL Cholesterol (Calc): 76 mg/dL (calc)
Non-HDL Cholesterol (Calc): 91 mg/dL (calc) (ref <130)
Total CHOL/HDL Ratio: 2.6 (calc) (ref <5.0)
Triglycerides: 73 mg/dL (ref <150)

## 2018-12-11 LAB — HEMOGLOBIN A1C
Hgb A1c MFr Bld: 8.8 % of total Hgb — ABNORMAL HIGH (ref <5.7)
Mean Plasma Glucose: 206 (calc)
eAG (mmol/L): 11.4 (calc)

## 2018-12-17 ENCOUNTER — Ambulatory Visit (INDEPENDENT_AMBULATORY_CARE_PROVIDER_SITE_OTHER): Payer: Medicare Other | Admitting: Family Medicine

## 2018-12-17 ENCOUNTER — Encounter: Payer: Self-pay | Admitting: Family Medicine

## 2018-12-17 ENCOUNTER — Other Ambulatory Visit: Payer: Self-pay

## 2018-12-17 VITALS — BP 138/70 | HR 74 | Temp 97.4°F | Resp 15 | Wt 186.0 lb

## 2018-12-17 DIAGNOSIS — J309 Allergic rhinitis, unspecified: Secondary | ICD-10-CM

## 2018-12-17 DIAGNOSIS — E1149 Type 2 diabetes mellitus with other diabetic neurological complication: Secondary | ICD-10-CM | POA: Diagnosis not present

## 2018-12-17 DIAGNOSIS — Z23 Encounter for immunization: Secondary | ICD-10-CM | POA: Diagnosis not present

## 2018-12-17 DIAGNOSIS — I10 Essential (primary) hypertension: Secondary | ICD-10-CM

## 2018-12-17 DIAGNOSIS — G4733 Obstructive sleep apnea (adult) (pediatric): Secondary | ICD-10-CM

## 2018-12-17 DIAGNOSIS — E785 Hyperlipidemia, unspecified: Secondary | ICD-10-CM | POA: Diagnosis not present

## 2018-12-17 DIAGNOSIS — G35 Multiple sclerosis: Secondary | ICD-10-CM

## 2018-12-17 NOTE — Patient Instructions (Addendum)
F/U mid to end January with MD , call if you need me before  Microalb to be sent to lab December 12 or after  Congrats on excekllent cholesterol and improved blood sugar, keep it up!  COUNT carbs, eat regularly, and plan what you eat   Non fasting Hba!c  And chem 7 and EGFr 5 days before Jan visit  For arthritic pains, topical creams and rubs that work , also you may use tylenol   Please start saline nasal flushes once daily , to help clear sinuses , when you have a lot of pressure, continue daily allergy medications  Flu vaccine today  Thanks for choosing [facility], we consider it a privelige to serve you.  It is important that you exercise regularly at least 30 minutes 5 times a week. If you develop chest pain, have severe difficulty breathing, or feel very tired, stop exercising immediately and seek medical attention

## 2018-12-17 NOTE — Progress Notes (Signed)
Michaela Peterson     MRN: [id number]      DOB: 03-20-1955   HPI Michaela Peterson is here for follow up and re-evaluation of chronic medical conditions, medication management and review of any available recent lab and radiology data.  Preventive health is updated, specifically  Cancer screening and Immunization.   Questions or concerns regarding consultations or procedures which the PT has had in the interim are  addressed. The PT denies any adverse reactions to current medications since the last visit.  Denies polyuria, polydipsia, blurred vision , or hypoglycemic episodes. Reports fluctuating blood sugars depending on food eaten    ROS Denies recent fever or chills. C/o  sinus pressure, denies nasal congestion, ear pain or sore throat. Denies chest congestion, productive cough or wheezing. Denies chest pains, palpitations and leg swelling Denies abdominal pain, nausea, vomiting,diarrhea or constipation.   Denies dysuria, frequency, hesitancy or incontinence. Denies joint pain, swelling and limitation in mobility. Denies headaches, seizures, numbness, or tingling. Denies depression, anxiety or insomnia. Denies skin break down or rash.   PE  BP 138/70   Pulse 74   Temp (!) 97.4 F (36.3 C) (Temporal)   Resp 15   Wt 186 lb (84.4 kg)   LMP 08/18/2012   SpO2 98%   BMI 35.14 kg/m    Patient alert and oriented and in no cardiopulmonary distress.  HEENT: No facial asymmetry, EOMI,    Neck supple .  Chest: Clear to auscultation bilaterally.  CVS: S1, S2 no murmurs, no S3.Regular rate.  ABD: Soft non tender.   Ext: No edema  MS: Adequate ROM spine, shoulders, hips and knees.  Skin: Intact, no ulcerations or rash noted.  Psych: Good eye contact, normal affect. Memory intact not anxious or depressed appearing.  CNS: CN 2-12 intact, power,  normal throughout.no focal deficits noted.   Assessment & Plan  Hypertension goal BP (blood pressure) < 140/90 Controlled, no change in  medication DASH diet and commitment to daily physical activity for a minimum of 30 minutes discussed and encouraged, as a part of hypertension management. The importance of attaining a healthy weight is also discussed.  BP/Weight 12/17/2018 11/12/2018 11/05/2018 09/18/2018 09/17/2018 09/11/2018 [id number]  Systolic BP 0000000 [id number] - Q000111Q  Diastolic BP 70 67 70 70 80 - 82  Wt. (Lbs) 186 187 187 187.8 188.2 190 191  BMI 35.14 35.33 35.33 35.48 35.56 35.9 36.09       Hyperlipidemia LDL goal <100 Hyperlipidemia:Low fat diet discussed and encouraged.   Lipid Panel  Lab Results  Component Value Date   CHOL 148 12/10/2018   HDL 57 12/10/2018   LDLCALC 76 12/10/2018   TRIG 73 12/10/2018   CHOLHDL 2.6 12/10/2018   Controlled, no change in medication     Morbid obesity ([place]) Obesity linked with diabetes and hypertension  Patient re-educated about  the importance of commitment to a  minimum of 150 minutes of exercise per week as able.  The importance of healthy food choices with portion control discussed, as well as eating regularly and within a 12 hour window most days. The need to choose "clean , green" food 50 to 75% of the time is discussed, as well as to make water the primary drink and set a goal of 64 ounces water daily.    Weight /BMI 12/17/2018 11/12/2018 11/05/2018  WEIGHT 186 lb 187 lb 187 lb  HEIGHT - 5\' 1"  5\' 1"   BMI 35.14 kg/m2 35.33 kg/m2 35.33 kg/m2  Type 2 diabetes mellitus with neurological complications (HCC) Improved, but not at goal Michaela Peterson is reminded of the importance of commitment to daily physical activity for 30 minutes or more, as able and the need to limit carbohydrate intake to 30 to 60 grams per meal to help with blood sugar control.   The need to take medication as prescribed, test blood sugar as directed, and to call between visits if there is a concern that blood sugar is uncontrolled is also discussed.   Michaela Peterson is reminded of the  importance of daily foot exam, annual eye examination, and good blood sugar, blood pressure and cholesterol control.  Diabetic Labs Latest Ref Rng & Units 12/10/2018 08/22/2018 05/15/2018 02/19/2018 02/10/2018  HbA1c <5.7 % of total Hgb 8.8(H) 9.9(H) 9.0(H) - 8.0(H)  Microalbumin Not Estab. ug/mL - - - 60.7(H) -  Micro/Creat Ratio 0.0 - 30.0 mg/g creat - - - 25.3 -  Chol <200 mg/dL 148 - 169 - -  HDL > OR = 50 mg/dL 57 - 63 - -  Calc LDL mg/dL (calc) 76 - 90 - -  Triglycerides <150 mg/dL 73 - 69 - -  Creatinine 0.50 - 0.99 mg/dL 0.97 0.97 0.96 - 0.97   BP/Weight 12/17/2018 11/12/2018 11/05/2018 09/18/2018 09/17/2018 09/11/2018 [id number]  Systolic BP 0000000 [id number] - Q000111Q  Diastolic BP 70 67 70 70 80 - 82  Wt. (Lbs) 186 187 187 187.8 188.2 190 191  BMI 35.14 35.33 35.33 35.48 35.56 35.9 36.09   Foot/eye exam completion dates Latest Ref Rng & Units 02/19/2018 01/20/2018  Eye Exam No Retinopathy - Retinopathy(A)  Foot Form Completion - Done -        OSA (obstructive sleep apnea) Re educated re need to use daily  Allergic rhinitis Increased symptoms, pt to start saline flushes once daily  MULTIPLE SCLEROSIS Clinically stable, followed by Neurology

## 2018-12-20 ENCOUNTER — Encounter: Payer: Self-pay | Admitting: Family Medicine

## 2018-12-20 NOTE — Assessment & Plan Note (Signed)
Controlled, no change in medication DASH diet and commitment to daily physical activity for a minimum of 30 minutes discussed and encouraged, as a part of hypertension management. The importance of attaining a healthy weight is also discussed.  BP/Weight 12/17/2018 11/12/2018 11/05/2018 09/18/2018 09/17/2018 09/11/2018 [id number]  Systolic BP 0000000 [id number] A999333 - Q000111Q  Diastolic BP 70 67 70 70 80 - 82  Wt. (Lbs) 186 187 187 187.8 188.2 190 191  BMI 35.14 35.33 35.33 35.48 35.56 35.9 36.09

## 2018-12-20 NOTE — Assessment & Plan Note (Signed)
Obesity linked with diabetes and hypertension  Patient re-educated about  the importance of commitment to a  minimum of 150 minutes of exercise per week as able.  The importance of healthy food choices with portion control discussed, as well as eating regularly and within a 12 hour window most days. The need to choose "clean , green" food 50 to 75% of the time is discussed, as well as to make water the primary drink and set a goal of 64 ounces water daily.    Weight /BMI 12/17/2018 11/12/2018 11/05/2018  WEIGHT 186 lb 187 lb 187 lb  HEIGHT - 5\' 1"  5\' 1"   BMI 35.14 kg/m2 35.33 kg/m2 35.33 kg/m2

## 2018-12-20 NOTE — Assessment & Plan Note (Signed)
Clinically stable, followed by Neurology

## 2018-12-20 NOTE — Assessment & Plan Note (Signed)
Increased symptoms, pt to start saline flushes once daily

## 2018-12-20 NOTE — Assessment & Plan Note (Signed)
Re educated re need to use daily

## 2018-12-20 NOTE — Assessment & Plan Note (Signed)
Hyperlipidemia:Low fat diet discussed and encouraged.   Lipid Panel  Lab Results  Component Value Date   CHOL 148 12/10/2018   HDL 57 12/10/2018   LDLCALC 76 12/10/2018   TRIG 73 12/10/2018   CHOLHDL 2.6 12/10/2018   Controlled, no change in medication

## 2018-12-20 NOTE — Assessment & Plan Note (Signed)
Improved, but not at goal Michaela Peterson is reminded of the importance of commitment to daily physical activity for 30 minutes or more, as able and the need to limit carbohydrate intake to 30 to 60 grams per meal to help with blood sugar control.   The need to take medication as prescribed, test blood sugar as directed, and to call between visits if there is a concern that blood sugar is uncontrolled is also discussed.   Michaela Peterson is reminded of the importance of daily foot exam, annual eye examination, and good blood sugar, blood pressure and cholesterol control.  Diabetic Labs Latest Ref Rng & Units 12/10/2018 08/22/2018 05/15/2018 02/19/2018 02/10/2018  HbA1c <5.7 % of total Hgb 8.8(H) 9.9(H) 9.0(H) - 8.0(H)  Microalbumin Not Estab. ug/mL - - - 60.7(H) -  Micro/Creat Ratio 0.0 - 30.0 mg/g creat - - - 25.3 -  Chol <200 mg/dL 148 - 169 - -  HDL > OR = 50 mg/dL 57 - 63 - -  Calc LDL mg/dL (calc) 76 - 90 - -  Triglycerides <150 mg/dL 73 - 69 - -  Creatinine 0.50 - 0.99 mg/dL 0.97 0.97 0.96 - 0.97   BP/Weight 12/17/2018 11/12/2018 11/05/2018 09/18/2018 09/17/2018 09/11/2018 [id number]  Systolic BP 0000000 [id number]  Diastolic BP 70 67 70 70 80 - 82  Wt. (Lbs) 186 187 187 187.8 188.2 190 191  BMI 35.14 35.33 35.33 35.48 35.56 35.9 36.09   Foot/eye exam completion dates Latest Ref Rng & Units 02/19/2018 01/20/2018  Eye Exam No Retinopathy - Retinopathy(A)  Foot Form Completion - Done -

## 2019-01-26 ENCOUNTER — Other Ambulatory Visit: Payer: Self-pay | Admitting: Family Medicine

## 2019-02-13 DIAGNOSIS — E119 Type 2 diabetes mellitus without complications: Secondary | ICD-10-CM | POA: Diagnosis not present

## 2019-02-13 DIAGNOSIS — H524 Presbyopia: Secondary | ICD-10-CM | POA: Diagnosis not present

## 2019-02-27 DIAGNOSIS — H0288A Meibomian gland dysfunction right eye, upper and lower eyelids: Secondary | ICD-10-CM | POA: Diagnosis not present

## 2019-03-31 DIAGNOSIS — I739 Peripheral vascular disease, unspecified: Secondary | ICD-10-CM | POA: Diagnosis not present

## 2019-03-31 DIAGNOSIS — E1140 Type 2 diabetes mellitus with diabetic neuropathy, unspecified: Secondary | ICD-10-CM | POA: Diagnosis not present

## 2019-03-31 DIAGNOSIS — M79672 Pain in left foot: Secondary | ICD-10-CM | POA: Diagnosis not present

## 2019-03-31 DIAGNOSIS — M79671 Pain in right foot: Secondary | ICD-10-CM | POA: Diagnosis not present

## 2019-04-03 DIAGNOSIS — E1149 Type 2 diabetes mellitus with other diabetic neurological complication: Secondary | ICD-10-CM | POA: Diagnosis not present

## 2019-04-04 LAB — BASIC METABOLIC PANEL WITH GFR
BUN: 17 mg/dL (ref 7–25)
CO2: 26 mmol/L (ref 20–32)
Calcium: 10 mg/dL (ref 8.6–10.4)
Chloride: 104 mmol/L (ref 98–110)
Creat: 0.88 mg/dL (ref 0.50–0.99)
GFR, Est African American: 81 mL/min/{1.73_m2} (ref >=60)
GFR, Est Non African American: 70 mL/min/{1.73_m2} (ref >=60)
Glucose, Bld: 170 mg/dL — ABNORMAL HIGH (ref 65–139)
Potassium: 4.2 mmol/L (ref 3.5–5.3)
Sodium: 140 mmol/L (ref 135–146)

## 2019-04-04 LAB — HEMOGLOBIN A1C
Hgb A1c MFr Bld: 7.8 % of total Hgb — ABNORMAL HIGH (ref <5.7)
Mean Plasma Glucose: 177 (calc)
eAG (mmol/L): 9.8 (calc)

## 2019-04-08 ENCOUNTER — Ambulatory Visit: Payer: Medicare Other | Admitting: Family Medicine

## 2019-04-22 ENCOUNTER — Ambulatory Visit: Payer: Medicare Other | Admitting: Gastroenterology

## 2019-04-22 ENCOUNTER — Other Ambulatory Visit: Payer: Self-pay

## 2019-04-22 ENCOUNTER — Encounter: Payer: Self-pay | Admitting: Gastroenterology

## 2019-04-22 ENCOUNTER — Ambulatory Visit: Payer: Medicare Other | Admitting: Family Medicine

## 2019-04-22 VITALS — BP 154/78 | HR 70 | Temp 96.9°F | Ht 61.0 in | Wt 187.4 lb

## 2019-04-22 DIAGNOSIS — K5900 Constipation, unspecified: Secondary | ICD-10-CM | POA: Diagnosis not present

## 2019-04-22 DIAGNOSIS — K219 Gastro-esophageal reflux disease without esophagitis: Secondary | ICD-10-CM

## 2019-04-22 DIAGNOSIS — D649 Anemia, unspecified: Secondary | ICD-10-CM

## 2019-04-22 NOTE — Progress Notes (Signed)
Cc'ed to pcp °

## 2019-04-22 NOTE — Patient Instructions (Signed)
Please have blood work completed today.   I am so glad you are doing well from our standpoint!  Please go to the emergency room if chest pain and shortness of breath. I am glad you are seeing your PCP tomorrow.  We will see you in 1 year!  I enjoyed seeing you again today! As you know, I value our relationship and want to provide genuine, compassionate, and quality care. I welcome your feedback. If you receive a survey regarding your visit,  I greatly appreciate you taking time to fill this out. See you next time!  Annitta Needs, PhD, ANP-BC [facility]

## 2019-04-22 NOTE — Progress Notes (Signed)
Referring Provider: [name], MD Primary Care Physician:  [name], MD  Primary GI: Dr. [name]   Chief Complaint  Patient presents with  . Follow-up    Anemia    HPI:   Michaela Peterson is a 64 y.o. female presenting today with a history of low normal ferritin, low iron, Hgb 12 range historically. Colonoscopy/EGD in July 2015 and capsule unremarkable June 2016.Constipation, GERD chronic. History of gastroparesis. Mildly elevated transaminases in the past with plans to pursue ultrasound if continues to remain elevated; however, recent LFTs normal. Recent colonoscopy completed due to history of adenomas, with surveillance due in 2025. Ferritin 104 last year.   States she will at times feel a flutter in her chest. Will get tired. Knows she isn't drinking enough water. Feels like heart races at times. Sometimes will take her breath. Has to move slow. Drank water last night and felt better. Sinuses have been bad the last few days. Pressure in lower forehead. Nose feels stopped up. Shower helped for a bit. Sees PCP tomorrow. Denies fluttering or chest pain now.   No N/V. Appetite seems a little "too good". Not taking Protonix every day. Takes as needed and feels GERD improved.   Constipation: took a colon cleanse. Hit her this morning.     Past Medical History:  Diagnosis Date  . Adnexal mass    Left; followed by Dr. [name]  . Anemia, iron deficiency   . Arthritis   . Chronic low back pain   . Diabetes mellitus, type 2 ([place])   . Dyspnea 09/19/2018  . Essential hypertension   . Gastroparesis   . GERD (gastroesophageal reflux disease)   . MS (multiple sclerosis) ([place])   . Nonalcoholic fatty liver disease   . Obesity   . Obesity, diabetes, and hypertension syndrome ([place]) 03/12/2009   Qualifier: Diagnosis of  By: Moshe Cipro MD, Joycelyn Schmid    . Sleep apnea 2009    Past Surgical History:  Procedure Laterality Date  . AGILE CAPSULE N/A 08/11/2014   Procedure: AGILE  CAPSULE;  Surgeon: Daneil Dolin, MD;  Location: [facility];  Service: Endoscopy;  Laterality: N/A;  0700  . BLADDER SUSPENSION    . CESAREAN SECTION     x2   . CHOLECYSTECTOMY    . COLONOSCOPY  2010   Dr. [name]: tubular adenoma, few scattered diverticula  . COLONOSCOPY N/A 10/01/2013   Dr. Pixie Casino preparation. Normal rectum. Normal colonic mucosa  . COLONOSCOPY N/A 11/12/2018   sigmoid and descending colon diverticulosis. Two 4-5 mm polyps in ascending colon. (tubular adenomas). Surveillance in 5 years  . DILATION AND CURETTAGE OF UTERUS     4  . ESOPHAGOGASTRODUODENOSCOPY  2010   Dr. [name]: normal esophagus, small hiatal hernia, questionable pale duodenal mucosa but negative for celiac sprue.   . ESOPHAGOGASTRODUODENOSCOPY N/A 10/01/2013   Dr. Rourk:gastric polyps-status post biopsy. Otherwise, normal EGD. fundic gland polyp, negative H.pylori  . GIVENS CAPSULE STUDY N/A 09/08/2014   Poor prep but overall unremarkable   . KNEE ARTHROSCOPY W/ DEBRIDEMENT    . LAPAROSCOPY ABDOMEN DIAGNOSTIC     lysis of adhesions  . POLYPECTOMY  11/12/2018   Procedure: POLYPECTOMY;  Surgeon: Daneil Dolin, MD;  Location: [facility];  Service: Endoscopy;;  . ROTATOR CUFF REPAIR    . TENDON LENGTHENING     Left wrist  . UMBILICAL HERNIA REPAIR      Current Outpatient Medications  Medication Sig Dispense  Refill  . albuterol (VENTOLIN HFA) 108 (90 Base) MCG/ACT inhaler Inhale 2 puffs into the lungs every 4 (four) hours as needed for wheezing or shortness of breath. 18 g 1  . allopurinol (ZYLOPRIM) 100 MG tablet TAKE 1 TABLET BY MOUTH  DAILY 90 tablet 0  . aspirin EC 81 MG tablet Take 81 mg by mouth daily.    Marland Kitchen azelastine (ASTELIN) 0.1 % nasal spray Place 2 sprays into both nostrils at bedtime. Use in each nostril as directed    . azelastine (OPTIVAR) 0.05 % ophthalmic solution Place 1 drop into both eyes 2 (two) times daily. 6 mL 12  . blood glucose meter kit and supplies Dispense based on  patient and insurance preference. Use to test once daily 1 each 0  . cetirizine (ZYRTEC) 10 MG tablet Take 10 mg by mouth daily.    . Cholecalciferol (DIALYVITE VITAMIN D 5000) 125 MCG (5000 UT) capsule Take 5,000 Units by mouth 3 (three) times a week.    . diphenhydrAMINE (BENADRYL) 25 MG tablet Take 25 mg by mouth every 6 (six) hours as needed for itching.    . fluticasone (FLONASE) 50 MCG/ACT nasal spray Place 2 sprays into both nostrils daily.     . fluticasone furoate-vilanterol (BREO ELLIPTA) 100-25 MCG/INH AEPB Inhale 1 puff, then rinse mouth, once daily maintenance 60 each 12  . glipiZIDE (GLUCOTROL) 10 MG tablet TAKE 1 TABLET BY MOUTH 2  TIMES DAILY BEFORE MEALS (Patient taking differently: Take 10 mg by mouth 2 (two) times daily before a meal. ) 180 tablet 1  . ibuprofen (ADVIL) 200 MG tablet Take 400 mg by mouth every 6 (six) hours as needed for headache or moderate pain.    . metFORMIN (GLUCOPHAGE) 1000 MG tablet TAKE 1 TABLET BY MOUTH TWO  TIMES DAILY WITH MEALS (Patient taking differently: Take 1,000 mg by mouth 2 (two) times daily. ) 180 tablet 3  . montelukast (SINGULAIR) 10 MG tablet TAKE 1 TABLET BY MOUTH AT  BEDTIME (Patient taking differently: Take 10 mg by mouth at bedtime. ) 90 tablet 3  . NIFEdipine (ADALAT CC) 90 MG 24 hr tablet Take 1 tablet (90 mg total) by mouth daily. 90 tablet 1  . nystatin (MYCOSTATIN/NYSTOP) powder Apply topically 2 (two) times daily. To irritated skin 60 g 4  . ONETOUCH DELICA LANCETS 35K MISC USE UP TO 4 TIMES DAILY AS  DIRECTED 400 each 1  . ONETOUCH ULTRA test strip USE TO TEST ONCE DAILY 100 strip 3  . OVER THE COUNTER MEDICATION Apply 1 application topically daily as needed (pain). horse liniment cream    . pantoprazole (PROTONIX) 40 MG tablet Take 40 mg by mouth 2 (two) times daily as needed (acid reflux).     . polyethylene glycol-electrolytes (TRILYTE) 420 g solution Take 4,000 mLs by mouth as directed. 4000 mL 0  . rosuvastatin (CRESTOR) 5 MG  tablet One tablet three times per week, every Monday, Wednesday and Friday (Patient taking differently: Take 5 mg by mouth 2 (two) times a week. ) 36 tablet 3  . triamcinolone cream (KENALOG) 0.1 % Apply 1 application topically daily as needed (rash).    . triamterene-hydrochlorothiazide (MAXZIDE-25) 37.5-25 MG tablet Take 1 tablet by mouth daily.      No current facility-administered medications for this visit.    Allergies as of 04/22/2019 - Review Complete 12/20/2018  Allergen Reaction Noted  . Gabapentin Shortness Of Breath 02/13/2017  . Ace inhibitors Cough 03/26/2012  . Aspirin    .  Oxycodone Hives 04/01/2018  . Penicillins Hives   . Rebif [interferon beta-1a] Other (See Comments) 05/01/2018  . Statins  10/11/2012    Family History  Problem Relation Age of Onset  . Hypertension Sister   . Diabetes Sister   . Heart disease Sister   . Kidney failure Sister   . Heart failure Mother   . Hypertension Mother   . Arthritis Mother   . Stroke Father   . Hypertension Son   . Hypertension Sister   . Hypertension Brother   . Mental illness Brother   . Colon cancer Maternal Aunt     Social History   Socioeconomic History  . Marital status: Married    Spouse name: [name]   . Number of children: 4  . Years of education: 27  . Highest education level: Not on file  Occupational History  . Occupation: unemployed     Fish farm manager: UNEMPLOYED  Tobacco Use  . Smoking status: Never Smoker  . Smokeless tobacco: Never Used  . Tobacco comment: patient lives with a smoker   Substance and Sexual Activity  . Alcohol use: No  . Drug use: No  . Sexual activity: Yes    Birth control/protection: Post-menopausal  Other Topics Concern  . Not on file  Social History Narrative   Patient lives at home with husband [name].    Patient has 4 children.    Patient has a some college.    Patient is right handed.    Patient is currently not working.    Social Determinants of Health   Financial  Resource Strain:   . Difficulty of Paying Living Expenses: Not on file  Food Insecurity:   . Worried About Charity fundraiser in the Last Year: Not on file  . Ran Out of Food in the Last Year: Not on file  Transportation Needs:   . Lack of Transportation (Medical): Not on file  . Lack of Transportation (Non-Medical): Not on file  Physical Activity:   . Days of Exercise per Week: Not on file  . Minutes of Exercise per Session: Not on file  Stress:   . Feeling of Stress : Not on file  Social Connections:   . Frequency of Communication with Friends and Family: Not on file  . Frequency of Social Gatherings with Friends and Family: Not on file  . Attends Religious Services: Not on file  . Active Member of Clubs or Organizations: Not on file  . Attends Archivist Meetings: Not on file  . Marital Status: Not on file    Review of Systems: Gen: Denies fever, chills, anorexia. Denies fatigue, weakness, weight loss.  CV: Denies chest pain, palpitations, syncope, peripheral edema, and claudication. Resp: Denies dyspnea at rest, cough, wheezing, coughing up blood, and pleurisy. GI: Denies vomiting blood, jaundice, and fecal incontinence.   Denies dysphagia or odynophagia. Derm: Denies rash, itching, dry skin Psych: Denies depression, anxiety, memory loss, confusion. No homicidal or suicidal ideation.  Heme: Denies bruising, bleeding, and enlarged lymph nodes.  Physical Exam: BP (!) 154/78   Pulse 70   Temp (!) 96.9 F (36.1 C) (Temporal)   Ht 5' 1"  (1.549 m)   Wt 187 lb 6.4 oz (85 kg)   LMP 08/18/2012   BMI 35.41 kg/m  General:   Alert and oriented. No distress noted. Pleasant and cooperative.  Head:  Normocephalic and atraumatic. Eyes:  Conjuctiva clear without scleral icterus. Abdomen:  +BS, soft, non-tender and non-distended. No rebound or  guarding. No HSM or masses noted. Msk:  Symmetrical without gross deformities. Normal posture. Extremities:  Without  edema. Neurologic:  Alert and  oriented x4 Psych:  Alert and cooperative. Normal mood and affect.  ASSESSMENT: Michaela Peterson is a 64 y.o. female presenting today with history of anemia, constipation, GERD, fatty liver, history of colon polyps, returning today after colonoscopy and doing well from a GI standpoint.  Anemia: history of low normal ferritin. Thorough evaluation in 2015/2016. Hgb stable. Recheck iron studies now.   Constipation: at baseline. No prescriptive agents needed.  GERD: well-controlled and taking Protonix prn now.   History of colon polyps: due for surveillance in 2025  Fatty liver: check HFP yearly. Normal transaminases Sept 2020.  Reporting vague palpitations intermittently, some fatigue. Keep appt with PCP tomorrow. Discussed ED precautions. Asymptomatic currently.   PLAN:   Continue Protonix prn  Update iron studies today  Return in 1year  Colonoscopy 2025  Annitta Needs, PhD, [facility]

## 2019-04-23 ENCOUNTER — Encounter: Payer: Self-pay | Admitting: Family Medicine

## 2019-04-23 ENCOUNTER — Ambulatory Visit (INDEPENDENT_AMBULATORY_CARE_PROVIDER_SITE_OTHER): Payer: Medicare Other | Admitting: Family Medicine

## 2019-04-23 DIAGNOSIS — I10 Essential (primary) hypertension: Secondary | ICD-10-CM | POA: Diagnosis not present

## 2019-04-23 DIAGNOSIS — E1149 Type 2 diabetes mellitus with other diabetic neurological complication: Secondary | ICD-10-CM | POA: Diagnosis not present

## 2019-04-23 DIAGNOSIS — E119 Type 2 diabetes mellitus without complications: Secondary | ICD-10-CM | POA: Diagnosis not present

## 2019-04-23 DIAGNOSIS — J309 Allergic rhinitis, unspecified: Secondary | ICD-10-CM

## 2019-04-23 DIAGNOSIS — G4733 Obstructive sleep apnea (adult) (pediatric): Secondary | ICD-10-CM

## 2019-04-23 DIAGNOSIS — M533 Sacrococcygeal disorders, not elsewhere classified: Secondary | ICD-10-CM | POA: Diagnosis not present

## 2019-04-23 DIAGNOSIS — K5900 Constipation, unspecified: Secondary | ICD-10-CM

## 2019-04-23 DIAGNOSIS — G35 Multiple sclerosis: Secondary | ICD-10-CM

## 2019-04-23 DIAGNOSIS — K219 Gastro-esophageal reflux disease without esophagitis: Secondary | ICD-10-CM

## 2019-04-23 NOTE — Assessment & Plan Note (Signed)
Stable, followed by Neurology

## 2019-04-23 NOTE — Assessment & Plan Note (Signed)
Followed by -Dr Annamaria Boots Reports dryness advised to call the office and see if they can adjust this.

## 2019-04-23 NOTE — Assessment & Plan Note (Signed)
Unchanged Michaela Peterson is educated about the importance of exercise daily to help with weight management. A minumum of 30 minutes daily is recommended. Additionally, importance of healthy food choices  with portion control discussed.   Wt Readings from Last 3 Encounters:  04/23/19 186 lb 1.9 oz (84.4 kg)  04/22/19 187 lb 6.4 oz (85 kg)  12/17/18 186 lb (84.4 kg)

## 2019-04-23 NOTE — Assessment & Plan Note (Signed)
Michaela Peterson is encouraged to check blood sugar daily as directed. Continue current medications. Is on statin as well. Educated on importance of maintain a well balanced diabetic friendly diet.  She is reminded the importance of maintaining  good blood sugars,  taking medications as directed, daily foot care, annual eye exams. Additionally educated about keeping good control over blood pressure and cholesterol as well.

## 2019-04-23 NOTE — Progress Notes (Signed)
Subjective:  Patient ID: Michaela Peterson, female    DOB: 06-Dec-1955  Age: 64 y.o. MRN: [id number]  CC:  Chief Complaint  Patient presents with  . Hypertension    follow up      HPI  HPI  Michaela Peterson is a 64 year old female patient of Michaela Peterson who presents today for follow-up regarding blood pressure, diabetes and tailbone pain.  Here for follow-up of hypertension.  She is not fully exercising daily.  But she reports that she gets a little hyperactive and restless at times.  She reports that she tries to stick to a low-salt diet.  She also reports that she is not taking her medications as directed, she sometimes she will take them daily, sometimes she takes them at random times during the day.  She is not very consistent with this.  So we cannot get a good blood pressure reading on her today.  Cardiac symptoms: none. Patient denies: chest pain, chest pressure/discomfort, claudication, exertional chest pressure/discomfort, irregular heart beat, near-syncope, palpitations, paroxysmal nocturnal dyspnea, syncope and tachypnea.   Diabetes follow-up:  Denies hypoglycemia.  Denies polydipsia and polyuria. Last A1c 7.8% improved from 8.8%. Denies non-healing wounds or rashes. Denies signs of UTI or other infections. Patient reports she follows a low sugar diet and checks feet regularly without concerns. Reports staying hydrated by drinking water.   Tail bone/low back pain: started weeks ago. Reports being restless. Nothing helps it. She reports she cant touch the floor secondary to her-she reports that that puts pressure on her tailbone and she is having some discomfort there.  She reports she does have some discomfort that goes into her buttocks as well.  Predominantly on the left side but she also reports that she has some discomfort with her right leg which is restlessness.  She asked if she can go to a chiropractic I suggested physical therapy to see if there is any stretching and movements that  she can do on a regular basis to help with her mobility she is in agreement to this.  Today patient denies signs and symptoms of COVID 19 infection including fever, chills, cough, shortness of breath, and headache. Past Medical, Surgical, Social History, Allergies, and Medications have been Reviewed.   Past Medical History:  Diagnosis Date  . Adnexal mass    Left; followed by Dr. Glo Peterson  . Anemia, iron deficiency   . Arthritis   . At risk for cardiovascular event 06/17/2016  . Chronic low back pain   . Diabetes mellitus, type 2 ([place])   . Dyspnea 09/19/2018  . Essential hypertension   . Gastroparesis   . GERD (gastroesophageal reflux disease)   . MS (multiple sclerosis) ([place])   . Nonalcoholic fatty liver disease   . Obesity   . Obesity, diabetes, and hypertension syndrome ([place]) 03/12/2009   Qualifier: Diagnosis of  By: Michaela Cipro MD, Joycelyn Schmid    . Sleep apnea 2009  . Vaginitis 03/29/2018    Current Meds  Medication Sig  . albuterol (VENTOLIN HFA) 108 (90 Base) MCG/ACT inhaler Inhale 2 puffs into the lungs every 4 (four) hours as needed for wheezing or shortness of breath.  . allopurinol (ZYLOPRIM) 100 MG tablet TAKE 1 TABLET BY MOUTH  DAILY  . aspirin EC 81 MG tablet Take 81 mg by mouth daily.  Marland Kitchen azelastine (ASTELIN) 0.1 % nasal spray Place 2 sprays into both nostrils at bedtime. Use in each nostril as directed  . blood glucose meter kit and supplies  Dispense based on patient and insurance preference. Use to test once daily  . cetirizine (ZYRTEC) 10 MG tablet Take 10 mg by mouth daily.  . Cholecalciferol (DIALYVITE VITAMIN D 5000) 125 MCG (5000 UT) capsule Take 5,000 Units by mouth 3 (three) times a week.  . diphenhydrAMINE (BENADRYL) 25 MG tablet Take 25 mg by mouth every 6 (six) hours as needed for itching.  Marland Kitchen ELDERBERRY PO Take by mouth daily.  . fluticasone (FLONASE) 50 MCG/ACT nasal spray Place 2 sprays into both nostrils daily.   Marland Kitchen glipiZIDE (GLUCOTROL) 10 MG tablet TAKE 1  TABLET BY MOUTH 2  TIMES DAILY BEFORE MEALS (Patient taking differently: Take 10 mg by mouth 2 (two) times daily before a meal. )  . ibuprofen (ADVIL) 200 MG tablet Take 400 mg by mouth every 6 (six) hours as needed for headache or moderate pain.  . Magnesium 250 MG TABS Take by mouth every other day.  . metFORMIN (GLUCOPHAGE) 1000 MG tablet TAKE 1 TABLET BY MOUTH TWO  TIMES DAILY WITH MEALS (Patient taking differently: Take 1,000 mg by mouth 2 (two) times daily. )  . montelukast (SINGULAIR) 10 MG tablet TAKE 1 TABLET BY MOUTH AT  BEDTIME (Patient taking differently: Take 10 mg by mouth at bedtime. )  . NIFEdipine (ADALAT CC) 90 MG 24 hr tablet Take 1 tablet (90 mg total) by mouth daily.  . Olopatadine HCl (PATADAY) 0.2 % SOLN Apply 1 drop to eye daily.  Glory Rosebush DELICA LANCETS 07P MISC USE UP TO 4 TIMES DAILY AS  DIRECTED  . ONETOUCH ULTRA test strip USE TO TEST ONCE DAILY  . OVER THE COUNTER MEDICATION Apply 1 application topically daily as needed (pain). horse liniment cream  . pantoprazole (PROTONIX) 40 MG tablet Take 40 mg by mouth as needed (acid reflux).   . rosuvastatin (CRESTOR) 5 MG tablet One tablet three times per week, every Monday, Wednesday and Friday (Patient taking differently: One tablet two times per week, every Monday and Friday)  . triamcinolone cream (KENALOG) 0.1 % Apply 1 application topically daily as needed (rash).  . triamterene-hydrochlorothiazide (MAXZIDE-25) 37.5-25 MG tablet Take 1 tablet by mouth daily.   . Zinc 50 MG CAPS Take by mouth every other day.    ROS:  Review of Systems  HENT: Negative.   Eyes: Negative.   Respiratory: Negative.   Cardiovascular: Negative.   Gastrointestinal: Negative.   Genitourinary: Negative.   Musculoskeletal:       Tailbone pain  Skin: Negative.   Neurological: Negative.   Endo/Heme/Allergies: Negative.   Psychiatric/Behavioral: Negative.   All other systems reviewed and are negative.    Objective:   Today's  Vitals: BP (!) 146/70 Comment: Recheck by provider  Pulse 93   Temp 97.7 F (36.5 C) (Temporal)   Resp 15   Ht 5' 1"  (1.549 m)   Wt 186 lb 1.9 oz (84.4 kg)   LMP 08/18/2012   SpO2 98%   BMI 35.17 kg/m  Vitals with BMI 04/23/2019 04/23/2019 04/22/2019  Height - 5' 1"  5' 1"   Weight - 186 lbs 2 oz 187 lbs 6 oz  BMI - 71.06 26.94  Systolic 854 627 035  Diastolic 70 60 78  Pulse - 93 70     Physical Exam Vitals and nursing note reviewed.  Constitutional:      Appearance: Normal appearance. She is well-developed and well-groomed. She is obese.  HENT:     Head: Normocephalic and atraumatic.     Comments: Mask  in place    Right Ear: External ear normal.     Left Ear: External ear normal.  Eyes:     General:        Right eye: No discharge.        Left eye: No discharge.     Conjunctiva/sclera: Conjunctivae normal.  Cardiovascular:     Rate and Rhythm: Normal rate and regular rhythm.     Pulses: Normal pulses.     Heart sounds: Normal heart sounds.  Pulmonary:     Effort: Pulmonary effort is normal.     Breath sounds: Normal breath sounds.  Musculoskeletal:        General: Normal range of motion.     Cervical back: Normal range of motion and neck supple.  Skin:    General: Skin is warm.  Neurological:     General: No focal deficit present.     Mental Status: She is alert and oriented to person, place, and time.  Psychiatric:        Attention and Perception: Attention normal.        Mood and Affect: Mood normal.        Speech: Speech normal.        Behavior: Behavior is hyperactive. Behavior is cooperative.        Thought Content: Thought content normal.        Cognition and Memory: Cognition normal.        Judgment: Judgment normal.     Comments: Appears to be a little bit restless     Assessment   1. Morbid obesity ([place])   2. Type 2 diabetes mellitus with neurological complications ([place])   3. Hypertension goal BP (blood pressure) < 140/90   4. Gastroesophageal  reflux disease, unspecified whether esophagitis present   5. Tail bone pain   6. MULTIPLE SCLEROSIS   7. OSA (obstructive sleep apnea)   8. Allergic rhinitis, unspecified seasonality, unspecified trigger   9. Constipation, unspecified constipation type     Tests ordered Orders Placed This Encounter  Procedures  . Ambulatory referral to Physical Therapy     Plan: Please see assessment and plan per problem list above.   No orders of the defined types were placed in this encounter.   Patient to follow-up in 05/21/2019   Perlie Mayo, NP

## 2019-04-23 NOTE — Assessment & Plan Note (Signed)
Improved, off and on.

## 2019-04-23 NOTE — Patient Instructions (Addendum)
Happy New Year! May you have a year filled with hope, love, happiness and laughter.  I appreciate the opportunity to provide you with care for your health and wellness. Today we discussed: Blood pressure and overall health   Follow up: 4 weeks   Get iron labs today Referrals today for tail bone pain  Avoid salt and take medication daily at the same time (8am).  Please continue to practice social distancing to keep you, your family, and our community safe.  If you must go out, please wear a mask and practice good handwashing.  It was a pleasure to see you and I look forward to continuing to work together on your health and well-being. Please do not hesitate to call the office if you need care or have questions about your care.  Have a wonderful day and week. With Gratitude, Cherly Beach, DNP, AGNP-BC

## 2019-04-23 NOTE — Assessment & Plan Note (Signed)
Is not taking medication daily or at the same time. Educated on the need to take daily at the same time. Will recheck in 1 month. Continue medication at this time. Encouraged dash diet. 30 minutes of exercise 5 days a week

## 2019-04-23 NOTE — Assessment & Plan Note (Signed)
On zyrtec, flonase, and astelin  Avoid allergens -cleaners cause issues.

## 2019-05-03 ENCOUNTER — Other Ambulatory Visit: Payer: Self-pay | Admitting: Gastroenterology

## 2019-05-03 ENCOUNTER — Other Ambulatory Visit: Payer: Self-pay | Admitting: Family Medicine

## 2019-05-06 ENCOUNTER — Encounter (HOSPITAL_COMMUNITY): Payer: Self-pay | Admitting: Physical Therapy

## 2019-05-06 ENCOUNTER — Other Ambulatory Visit: Payer: Self-pay

## 2019-05-06 ENCOUNTER — Ambulatory Visit (HOSPITAL_COMMUNITY): Payer: Medicare Other | Attending: Family Medicine | Admitting: Physical Therapy

## 2019-05-06 DIAGNOSIS — M545 Low back pain: Secondary | ICD-10-CM | POA: Insufficient documentation

## 2019-05-06 DIAGNOSIS — G8929 Other chronic pain: Secondary | ICD-10-CM | POA: Diagnosis not present

## 2019-05-06 NOTE — Patient Instructions (Signed)
[place]

## 2019-05-06 NOTE — Therapy (Signed)
[name], [place], [address] Phone: [phone]   Fax:  [phone]  Physical Therapy Evaluation  Patient Details  Name: Michaela Peterson MRN: [id number] Date of Birth: [birth year] Referring Provider (PT): Perlie Mayo, NP   Encounter Date: 05/06/2019  PT End of Session - 05/06/19 1744    Visit Number  1    Number of Visits  1    Authorization Type  [employer] Medicare    Authorization Time Period  05/06/19    PT Start Time  1120    PT Stop Time  1200    PT Time Calculation (min)  40 min    Activity Tolerance  Patient tolerated treatment well    Behavior During Therapy  [facility] for tasks assessed/performed       Past Medical History:  Diagnosis Date  . Adnexal mass    Left; followed by Dr. [name]  . Anemia, iron deficiency   . Arthritis   . At risk for cardiovascular event 06/17/2016  . Chronic low back pain   . Diabetes mellitus, type 2 ([place])   . Dyspnea 09/19/2018  . Essential hypertension   . Gastroparesis   . GERD (gastroesophageal reflux disease)   . MS (multiple sclerosis) ([place])   . Nonalcoholic fatty liver disease   . Obesity   . Obesity, diabetes, and hypertension syndrome ([place]) 03/12/2009   Qualifier: Diagnosis of  By: Moshe Cipro MD, Joycelyn Schmid    . Sleep apnea 2009  . Vaginitis 03/29/2018    Past Surgical History:  Procedure Laterality Date  . AGILE CAPSULE N/A 08/11/2014   Procedure: AGILE CAPSULE;  Surgeon: Daneil Dolin, MD;  [facility];  Service: Endoscopy;  Laterality: N/A;  0700  . BLADDER SUSPENSION    . CESAREAN SECTION     x2   . CHOLECYSTECTOMY    . COLONOSCOPY  2010   Dr. [name]: tubular adenoma, few scattered diverticula  . COLONOSCOPY N/A 10/01/2013   Dr. Pixie Casino preparation. Normal rectum. Normal colonic mucosa  . COLONOSCOPY N/A 11/12/2018   sigmoid and descending colon diverticulosis. Two 4-5 mm polyps in ascending colon. (tubular adenomas). Surveillance in 5 years  .  DILATION AND CURETTAGE OF UTERUS     4  . ESOPHAGOGASTRODUODENOSCOPY  2010   Dr. [name]: normal esophagus, small hiatal hernia, questionable pale duodenal mucosa but negative for celiac sprue.   . ESOPHAGOGASTRODUODENOSCOPY N/A 10/01/2013   Dr. Rourk:gastric polyps-status post biopsy. Otherwise, normal EGD. fundic gland polyp, negative H.pylori  . GIVENS CAPSULE STUDY N/A 09/08/2014   Poor prep but overall unremarkable   . KNEE ARTHROSCOPY W/ DEBRIDEMENT    . LAPAROSCOPY ABDOMEN DIAGNOSTIC     lysis of adhesions  . POLYPECTOMY  11/12/2018   Procedure: POLYPECTOMY;  Surgeon: Daneil Dolin, MD;  Location: [facility];  Service: Endoscopy;;  . ROTATOR CUFF REPAIR    . TENDON LENGTHENING     Left wrist  . UMBILICAL HERNIA REPAIR      There were no vitals filed for this visit.   Subjective Assessment - 05/06/19 1127    Subjective  Patient reported that for several years, she feels like when she is sitting it irritates her "butt bone". Patient reported that she has had a history of back pain in the past after having hurt it at work. Reported that she sometimes has hip pain on each side. Denied numbness in her legs, reported sometimes having a restless feeling in her  legs. She denied any changes in her bowel or bladder function. She reported that she had a scope on the right knee and that they are considering knee replacements now. She reported that she had a gall bladder removal and a hernia removal. Reported having fallen once last year and that she fell on her knees and hands when she did fall. She reported that she was diagnosed with MS in 2004 but that the only symptom she notices with this is that she is sensitive to temperature.    Pertinent History  See above    Limitations  Sitting    How long can you sit comfortably?  10-15 minutes    How long can you stand comfortably?  Not limited    How long can you walk comfortably?  Not limited    Patient Stated Goals  Get exercises to do at home  to help relieve the pain    Currently in Pain?  Yes    Pain Score  4     Pain Location  Other (Comment)   Tailbone   Pain Orientation  Lower    Pain Descriptors / Indicators  Aching    Pain Type  Chronic pain    Pain Onset  More than a month ago    Pain Frequency  Intermittent    Aggravating Factors   Sitting    Pain Relieving Factors  Getting up off of it    Effect of Pain on Daily Activities  Moderately affects         [facility] PT Assessment - 05/06/19 0001      Assessment   Medical Diagnosis  Tail bone pain    Referring Provider (PT)  Perlie Mayo, NP    Onset Date/Surgical Date  --   Many years   Next MD Visit  --   In about 3 weeks   Prior Therapy  Yes, for back, shoulder      Precautions   Precautions  None      Restrictions   Weight Bearing Restrictions  No      Balance Screen   Has the patient fallen in the past 6 months  No    Has the patient had a decrease in activity level because of a fear of falling?   No    Is the patient reluctant to leave their home because of a fear of falling?   No      Home Social worker  Private residence    Living Arrangements  Spouse/significant other;Children    Type of Home  Other(Comment)   Double wide   Home Access  Level entry    [place] - single point    Additional Comments  Uses a cane sometimes if her knee feels like it's going to give out      Prior Function   Level of Independence  Independent;Independent with basic ADLs    Vocation  On disability      Cognition   Overall Cognitive Status  Within Functional Limits for tasks assessed      Observation/Other Assessments   Observations  Patient laying rotated on the side to decrease pressure on sacrum    Focus on Therapeutic Outcomes (FOTO)   1 time visit      Sensation   Light Touch  Appears Intact      ROM / Strength   AROM / PROM / Strength  AROM;Strength      AROM   AROM Assessment Site  Lumbar     Lumbar Flexion  WFL, pain on left side of low back    Lumbar Extension  25% limited, pain in mid back     Lumbar - Right Side Bend  WFL, painful    Lumbar - Left Side Bend  WFL, painful     Lumbar - Right Rotation  WFL    Lumbar - Left Rotation  [facility]      Strength   Strength Assessment Site  Hip;Knee;Ankle    Right/Left Hip  Right;Left    Right Hip Flexion  4-/5    Right Hip Extension  4+/5    Right Hip ABduction  5/5    Left Hip Flexion  4+/5    Left Hip Extension  4+/5    Right/Left Knee  Right;Left    Right Knee Flexion  4+/5    Right Knee Extension  4+/5    Right/Left Ankle  Right;Left    Right Ankle Dorsiflexion  5/5    Left Ankle Dorsiflexion  5/5      Palpation   Spinal mobility  WFL    SI assessment   Tenderness to SI, positive distraction test and compression test    Palpation comment  Painful on LT side of gluteals and with palpation of coccyx and sacrum                Objective measurements completed on examination: See above findings.              PT Education - 05/06/19 1743    Education Details  Discussed examination findings, POC and HEP.    Person(s) Educated  Patient    Methods  Explanation;Handout    Comprehension  Verbalized understanding       PT Short Term Goals - 05/06/19 1745      PT SHORT TERM GOAL #1   Title  Patient will be educated on HEP and report understanding.    Time  1    Period  Days    Status  Achieved    Target Date  05/06/19                Plan - 05/06/19 1750    Clinical Impression Statement  Patient is a 64 year old female who presented to outpatient physical therapy with primary complaint of tail bone pain which has been ongoing for several years. Upon examination noted patient had some limitations in AROM, and decreased strength. In addition noted muscular restrictions through gluteals and some tenderness to SI joint mobility. Patient would benefit from continued skilled physical therapy,  however patient explained that she would prefer to get exercises and not follow-up with therapy due to financial reasons. Therapist educated patient on several exercises as well as provided the patient with instruction how to perform a self-massage using a tennis ball for the gluteals. At this time patient will not continue with skilled therapy per the patient request and was educated that she should contact her MD for a new referral if she wanted to return to therapy in the future.    Personal Factors and Comorbidities  Age;Comorbidity 3+    Comorbidities  DMII, HTN, MS    Examination-Activity Limitations  Bed Mobility;Sit    Examination-Participation Restrictions  Meal Prep;Driving    Stability/Clinical Decision Making  Stable/Uncomplicated    Clinical Decision Making  Low    PT Frequency  One time visit  PT Treatment/Interventions  ADLs/Self Care Home Management;Therapeutic exercise    PT Next Visit Plan  1 time visit    PT Home Exercise Plan  05/06/19: Hip isometric abduction and adduction, bridges, self-massage with tennis ball    Consulted and Agree with Plan of Care  Patient       Patient will benefit from skilled therapeutic intervention in order to improve the following deficits and impairments:  Pain, Improper body mechanics, Decreased mobility, Postural dysfunction, Decreased activity tolerance, Decreased endurance, Decreased range of motion, Decreased strength, Obesity  Visit Diagnosis: Chronic low back pain, unspecified back pain laterality, unspecified whether sciatica present     Problem List Patient Active Problem List   Diagnosis Date Noted  . Tail bone pain 04/23/2019  . History of colonic polyps 09/17/2018  . Right knee pain 08/21/2018  . Normocytic anemia 05/14/2018  . Pelvic pain 02/19/2018  . Frequent headaches 02/19/2018  . Elevated LFTs 12/09/2017  . Morbid obesity ([place]) 09/15/2017  . Periodic limb movements of sleep 10/08/2016  . Type 2 diabetes mellitus  with neurological complications ([place]) [id number]  . Constipation 02/09/2015  . Abnormal CT scan, chest 10/19/2013  . Dyspepsia 09/17/2013  . Tubular adenoma 09/17/2013  . Hyperlipidemia LDL goal <100 02/02/2011  . Pulmonary hypertension ([place]) 06/26/2010  . Nonalcoholic fatty liver disease 07/13/2009  . GERD 11/01/2008  . GASTROPARESIS 11/01/2008  . MULTIPLE SCLEROSIS 06/16/2007  . OSA (obstructive sleep apnea) 05/16/2007  . Hypertension goal BP (blood pressure) < 140/90 04/20/2007  . Allergic rhinitis 04/18/2007   Clarene Critchley PT, DPT 5:54 PM, 05/06/19 [place], [place], [address] Phone: [phone]   Fax:  [phone]  Name: PAYTTON SNEARY MRN: [id number] Date of Birth: [birth year]

## 2019-05-21 ENCOUNTER — Ambulatory Visit (INDEPENDENT_AMBULATORY_CARE_PROVIDER_SITE_OTHER): Payer: Medicare Other | Admitting: Family Medicine

## 2019-05-21 ENCOUNTER — Encounter: Payer: Self-pay | Admitting: Family Medicine

## 2019-05-21 ENCOUNTER — Other Ambulatory Visit: Payer: Self-pay

## 2019-05-21 VITALS — BP 120/74 | HR 80 | Temp 97.1°F | Resp 15 | Ht 61.0 in | Wt 188.1 lb

## 2019-05-21 DIAGNOSIS — J309 Allergic rhinitis, unspecified: Secondary | ICD-10-CM

## 2019-05-21 DIAGNOSIS — J3489 Other specified disorders of nose and nasal sinuses: Secondary | ICD-10-CM

## 2019-05-21 DIAGNOSIS — E559 Vitamin D deficiency, unspecified: Secondary | ICD-10-CM

## 2019-05-21 DIAGNOSIS — I10 Essential (primary) hypertension: Secondary | ICD-10-CM | POA: Diagnosis not present

## 2019-05-21 DIAGNOSIS — L659 Nonscarring hair loss, unspecified: Secondary | ICD-10-CM | POA: Insufficient documentation

## 2019-05-21 DIAGNOSIS — D649 Anemia, unspecified: Secondary | ICD-10-CM | POA: Diagnosis not present

## 2019-05-21 LAB — IRON,TIBC AND FERRITIN PANEL
%SAT: 8 % (calc) — ABNORMAL LOW (ref 16–45)
Ferritin: 33 ng/mL (ref 16–288)
Iron: 29 ug/dL — ABNORMAL LOW (ref 45–160)
TIBC: 344 mcg/dL (calc) (ref 250–450)

## 2019-05-21 LAB — CBC WITH DIFFERENTIAL/PLATELET
Absolute Monocytes: 482 cells/uL (ref 200–950)
Basophils Absolute: 86 cells/uL (ref 0–200)
Basophils Relative: 0.8 %
Eosinophils Absolute: 75 cells/uL (ref 15–500)
Eosinophils Relative: 0.7 %
HCT: 35.8 % (ref 35.0–45.0)
Hemoglobin: 11.9 g/dL (ref 11.7–15.5)
Lymphs Abs: 3360 cells/uL (ref 850–3900)
MCH: 27.1 pg (ref 27.0–33.0)
MCHC: 33.2 g/dL (ref 32.0–36.0)
MCV: 81.5 fL (ref 80.0–100.0)
MPV: 10.1 fL (ref 7.5–12.5)
Monocytes Relative: 4.5 %
Neutro Abs: 6698 cells/uL (ref 1500–7800)
Neutrophils Relative %: 62.6 %
Platelets: 429 10*3/uL — ABNORMAL HIGH (ref 140–400)
RBC: 4.39 10*6/uL (ref 3.80–5.10)
RDW: 14.5 % (ref 11.0–15.0)
Total Lymphocyte: 31.4 %
WBC: 10.7 10*3/uL (ref 3.8–10.8)

## 2019-05-21 LAB — VITAMIN D 25 HYDROXY (VIT D DEFICIENCY, FRACTURES): Vit D, 25-Hydroxy: 29 ng/mL — ABNORMAL LOW (ref 30–100)

## 2019-05-21 LAB — TSH: TSH: 2.15 mIU/L (ref 0.40–4.50)

## 2019-05-21 NOTE — Assessment & Plan Note (Signed)
Improved, continue medication

## 2019-05-21 NOTE — Assessment & Plan Note (Signed)
ENT referral made

## 2019-05-21 NOTE — Patient Instructions (Addendum)
I appreciate the opportunity to provide you with care for your health and wellness. Today we discussed: sinuses and blood pressure   Follow up: 11/10/2019 as scheduled   Labs today  Referral to ENT   Continue all medications as directed, so glad your BP is looking good!   Please continue to practice social distancing to keep you, your family, and our community safe.  If you must go out, please wear a mask and practice good handwashing.  It was a pleasure to see you and I look forward to continuing to work together on your health and well-being. Please do not hesitate to call the office if you need care or have questions about your care.  Have a wonderful day and week. With Gratitude, Cherly Beach, DNP, AGNP-BC

## 2019-05-21 NOTE — Assessment & Plan Note (Signed)
Vit D ordered.

## 2019-05-21 NOTE — Assessment & Plan Note (Signed)
ENT referral, might have polpys

## 2019-05-21 NOTE — Progress Notes (Signed)
Subjective:  Patient ID: Michaela Peterson, female    DOB: 07/03/1955  Age: 64 y.o. MRN: [id number]  CC:  Chief Complaint  Patient presents with  . Hypertension    4 wk follow up       HPI  HPI Here for follow-up of hypertension. Tries to walk more now with nice weather. Reports trying to follow a heart healthy diet.  Cardiac symptoms: none. Patient denies: chest pain, chest pressure/discomfort, claudication, dyspnea, exertional chest pressure/discomfort, fatigue, irregular heart beat, lower extremity edema, near-syncope, orthopnea, palpitations, paroxysmal nocturnal dyspnea, syncope and tachypnea. Reports taking all medications as directed and denies side effects.  Does report having hair loss, some constipation. But no skin changes. TSH was normal last year.   She also reports trying to get sinuses better flowing, but nothing is helping medication wise. She is open to referral to ENT.  Today patient denies signs and symptoms of COVID 19 infection including fever, chills, cough, shortness of breath, and headache. Past Medical, Surgical, Social History, Allergies, and Medications have been Reviewed.   Past Medical History:  Diagnosis Date  . Adnexal mass    Left; followed by Dr. Glo Peterson  . Anemia, iron deficiency   . Arthritis   . At risk for cardiovascular event 06/17/2016  . Chronic low back pain   . Diabetes mellitus, type 2 ([place])   . Dyspnea 09/19/2018  . Essential hypertension   . Gastroparesis   . GERD (gastroesophageal reflux disease)   . MS (multiple sclerosis) ([place])   . Nonalcoholic fatty liver disease   . Obesity   . Obesity, diabetes, and hypertension syndrome ([place]) 03/12/2009   Qualifier: Diagnosis of  By: Michaela Cipro MD, Michaela Peterson    . Sleep apnea 2009  . Vaginitis 03/29/2018    Current Meds  Medication Sig  . albuterol (VENTOLIN HFA) 108 (90 Base) MCG/ACT inhaler Inhale 2 puffs into the lungs every 4 (four) hours as needed for wheezing or shortness of breath.    . allopurinol (ZYLOPRIM) 100 MG tablet TAKE 1 TABLET BY MOUTH  DAILY  . aspirin EC 81 MG tablet Take 81 mg by mouth daily.  Marland Kitchen azelastine (ASTELIN) 0.1 % nasal spray Place 2 sprays into both nostrils at bedtime. Use in each nostril as directed  . blood glucose meter kit and supplies Dispense based on patient and insurance preference. Use to test once daily  . cetirizine (ZYRTEC) 10 MG tablet Take 10 mg by mouth daily.  . Cholecalciferol (DIALYVITE VITAMIN D 5000) 125 MCG (5000 UT) capsule Take 5,000 Units by mouth 3 (three) times a week.  . diphenhydrAMINE (BENADRYL) 25 MG tablet Take 25 mg by mouth every 6 (six) hours as needed for itching.  Marland Kitchen ELDERBERRY PO Take by mouth daily.  . fluticasone (FLONASE) 50 MCG/ACT nasal spray Place 2 sprays into both nostrils daily.   Marland Kitchen glipiZIDE (GLUCOTROL) 10 MG tablet TAKE 1 TABLET BY MOUTH  TWICE DAILY BEFORE MEALS  . ibuprofen (ADVIL) 200 MG tablet Take 400 mg by mouth every 6 (six) hours as needed for headache or moderate pain.  . Magnesium 250 MG TABS Take by mouth every other day.  . metFORMIN (GLUCOPHAGE) 1000 MG tablet TAKE 1 TABLET BY MOUTH TWO  TIMES DAILY WITH MEALS (Patient taking differently: Take 1,000 mg by mouth 2 (two) times daily. )  . montelukast (SINGULAIR) 10 MG tablet TAKE 1 TABLET BY MOUTH AT  BEDTIME (Patient taking differently: Take 10 mg by mouth at bedtime. )  .  NIFEdipine (ADALAT CC) 90 MG 24 hr tablet TAKE 1 TABLET BY MOUTH  DAILY  . Olopatadine HCl (PATADAY) 0.2 % SOLN Apply 1 drop to eye daily.  Glory Rosebush DELICA LANCETS 23F MISC USE UP TO 4 TIMES DAILY AS  DIRECTED  . ONETOUCH ULTRA test strip USE TO TEST ONCE DAILY  . OVER THE COUNTER MEDICATION Apply 1 application topically daily as needed (pain). horse liniment cream  . pantoprazole (PROTONIX) 40 MG tablet TAKE 1 TABLET BY MOUTH TWO  TIMES DAILY BEFORE MEALS  . rosuvastatin (CRESTOR) 5 MG tablet One tablet three times per week, every Monday, Wednesday and Friday (Patient  taking differently: One tablet two times per week, every Monday and Friday)  . triamcinolone cream (KENALOG) 0.1 % Apply 1 application topically daily as needed (rash).  . triamterene-hydrochlorothiazide (MAXZIDE-25) 37.5-25 MG tablet TAKE 1 TABLET BY MOUTH  DAILY  . Zinc 50 MG CAPS Take by mouth every other day.    ROS:  Review of Systems  Constitutional: Negative.   HENT:       Sinus pressure, swelling above eyes   Eyes: Negative.   Respiratory: Negative.   Cardiovascular: Negative.   Gastrointestinal: Positive for constipation.  Genitourinary: Negative.   Musculoskeletal: Negative.   Skin:       Hair loss   Neurological: Negative.   Endo/Heme/Allergies: Negative.   Psychiatric/Behavioral: Negative.   All other systems reviewed and are negative.    Objective:   Today's Vitals: BP 120/74   Pulse 80   Temp (!) 97.1 F (36.2 C) (Temporal)   Resp 15   Ht 5' 1"  (1.549 m)   Wt 188 lb 1.9 oz (85.3 kg)   LMP 08/18/2012   SpO2 94%   BMI 35.54 kg/m  Vitals with BMI 05/21/2019 04/23/2019 04/23/2019  Height 5' 1"  - 5' 1"   Weight 188 lbs 2 oz - 186 lbs 2 oz  BMI 57.32 - 20.25  Systolic 427 062 376  Diastolic 74 70 60  Pulse 80 - 93     Physical Exam Vitals and nursing note reviewed.  Constitutional:      Appearance: Normal appearance. She is well-developed and well-groomed. She is obese.  HENT:     Head: Normocephalic and atraumatic.     Right Ear: External ear normal.     Left Ear: External ear normal.  Eyes:     General:        Right eye: No discharge.        Left eye: No discharge.     Conjunctiva/sclera: Conjunctivae normal.     Comments: Very puff/swollen upper eye lids  Cardiovascular:     Rate and Rhythm: Normal rate and regular rhythm.     Pulses: Normal pulses.     Heart sounds: Normal heart sounds.  Pulmonary:     Effort: Pulmonary effort is normal.     Breath sounds: Normal breath sounds.  Musculoskeletal:        General: Normal range of motion.      Cervical back: Normal range of motion and neck supple.  Skin:    General: Skin is warm.  Neurological:     General: No focal deficit present.     Mental Status: She is alert and oriented to person, place, and time.  Psychiatric:        Attention and Perception: Attention normal.        Mood and Affect: Mood normal.        Speech: Speech normal.  Behavior: Behavior normal. Behavior is cooperative.        Thought Content: Thought content normal.        Cognition and Memory: Cognition normal.        Judgment: Judgment normal.     Assessment   1. Vitamin D deficiency   2. Hair loss   3. Allergic rhinitis, unspecified seasonality, unspecified trigger   4. Sinus pressure     Tests ordered Orders Placed This Encounter  Procedures  . TSH  . VITAMIN D 25 Hydroxy (Vit-D Deficiency, Fractures)  . Ambulatory referral to ENT     Plan: Please see assessment and plan per problem list above.   No orders of the defined types were placed in this encounter.   Patient to follow-up in 11/10/2019 .  Perlie Mayo, NP

## 2019-05-21 NOTE — Assessment & Plan Note (Signed)
TSH ordered.

## 2019-06-15 ENCOUNTER — Other Ambulatory Visit (HOSPITAL_COMMUNITY): Payer: Self-pay | Admitting: Otolaryngology

## 2019-06-15 ENCOUNTER — Other Ambulatory Visit: Payer: Self-pay | Admitting: Otolaryngology

## 2019-06-15 DIAGNOSIS — J320 Chronic maxillary sinusitis: Secondary | ICD-10-CM

## 2019-06-15 DIAGNOSIS — J343 Hypertrophy of nasal turbinates: Secondary | ICD-10-CM | POA: Diagnosis not present

## 2019-06-15 DIAGNOSIS — J310 Chronic rhinitis: Secondary | ICD-10-CM | POA: Diagnosis not present

## 2019-06-16 ENCOUNTER — Other Ambulatory Visit: Payer: Self-pay | Admitting: Emergency Medicine

## 2019-06-16 DIAGNOSIS — D649 Anemia, unspecified: Secondary | ICD-10-CM

## 2019-06-16 DIAGNOSIS — K5900 Constipation, unspecified: Secondary | ICD-10-CM

## 2019-06-16 DIAGNOSIS — K219 Gastro-esophageal reflux disease without esophagitis: Secondary | ICD-10-CM

## 2019-06-30 ENCOUNTER — Ambulatory Visit (HOSPITAL_COMMUNITY)
Admission: RE | Admit: 2019-06-30 | Discharge: 2019-06-30 | Disposition: A | Discharge status: Home / Self Care | Payer: Medicare Other | Source: Ambulatory Visit | Attending: Otolaryngology | Admitting: Otolaryngology

## 2019-06-30 ENCOUNTER — Other Ambulatory Visit: Payer: Self-pay

## 2019-06-30 DIAGNOSIS — M25579 Pain in unspecified ankle and joints of unspecified foot: Secondary | ICD-10-CM | POA: Diagnosis not present

## 2019-06-30 DIAGNOSIS — J320 Chronic maxillary sinusitis: Secondary | ICD-10-CM | POA: Insufficient documentation

## 2019-06-30 DIAGNOSIS — J329 Chronic sinusitis, unspecified: Secondary | ICD-10-CM | POA: Diagnosis not present

## 2019-06-30 DIAGNOSIS — E1140 Type 2 diabetes mellitus with diabetic neuropathy, unspecified: Secondary | ICD-10-CM | POA: Diagnosis not present

## 2019-06-30 DIAGNOSIS — L110 Acquired keratosis follicularis: Secondary | ICD-10-CM | POA: Diagnosis not present

## 2019-06-30 DIAGNOSIS — I739 Peripheral vascular disease, unspecified: Secondary | ICD-10-CM | POA: Diagnosis not present

## 2019-06-30 DIAGNOSIS — M79671 Pain in right foot: Secondary | ICD-10-CM | POA: Diagnosis not present

## 2019-07-09 DIAGNOSIS — J343 Hypertrophy of nasal turbinates: Secondary | ICD-10-CM | POA: Diagnosis not present

## 2019-07-09 DIAGNOSIS — J342 Deviated nasal septum: Secondary | ICD-10-CM | POA: Diagnosis not present

## 2019-07-09 DIAGNOSIS — J310 Chronic rhinitis: Secondary | ICD-10-CM | POA: Diagnosis not present

## 2019-07-15 ENCOUNTER — Telehealth: Payer: Self-pay

## 2019-07-15 NOTE — Telephone Encounter (Signed)
Pt requesting labwork to check for gout

## 2019-07-15 NOTE — Telephone Encounter (Signed)
Pt is asking for lab work for Gout.  Both hands and left foot is hurting

## 2019-07-16 NOTE — Telephone Encounter (Signed)
Pls advise and order fasting lipid, cmp and EGFr, hBA1C, microalb and uric acid ( has had high uric acid level in the past), get done next week Wednesday please

## 2019-07-17 ENCOUNTER — Other Ambulatory Visit: Payer: Self-pay | Admitting: *Deleted

## 2019-07-17 DIAGNOSIS — E785 Hyperlipidemia, unspecified: Secondary | ICD-10-CM

## 2019-07-17 DIAGNOSIS — I10 Essential (primary) hypertension: Secondary | ICD-10-CM

## 2019-07-17 DIAGNOSIS — E1149 Type 2 diabetes mellitus with other diabetic neurological complication: Secondary | ICD-10-CM

## 2019-07-17 NOTE — Telephone Encounter (Signed)
Pt advised to get labs next Wednesday with verbal understanding labs ordered

## 2019-07-22 DIAGNOSIS — I10 Essential (primary) hypertension: Secondary | ICD-10-CM | POA: Diagnosis not present

## 2019-07-22 DIAGNOSIS — E785 Hyperlipidemia, unspecified: Secondary | ICD-10-CM | POA: Diagnosis not present

## 2019-07-22 DIAGNOSIS — E1149 Type 2 diabetes mellitus with other diabetic neurological complication: Secondary | ICD-10-CM | POA: Diagnosis not present

## 2019-07-23 LAB — COMPLETE METABOLIC PANEL WITH GFR
AG Ratio: 1.4 (calc) (ref 1.0–2.5)
ALT: 21 U/L (ref 6–29)
AST: 18 U/L (ref 10–35)
Albumin: 4.5 g/dL (ref 3.6–5.1)
Alkaline phosphatase (APISO): 92 U/L (ref 37–153)
BUN: 16 mg/dL (ref 7–25)
CO2: 27 mmol/L (ref 20–32)
Calcium: 10 mg/dL (ref 8.6–10.4)
Chloride: 103 mmol/L (ref 98–110)
Creat: 0.87 mg/dL (ref 0.50–0.99)
GFR, Est African American: 82 mL/min/{1.73_m2} (ref >=60)
GFR, Est Non African American: 71 mL/min/{1.73_m2} (ref >=60)
Globulin: 3.2 g/dL (calc) (ref 1.9–3.7)
Glucose, Bld: 157 mg/dL — ABNORMAL HIGH (ref 65–99)
Potassium: 4.4 mmol/L (ref 3.5–5.3)
Sodium: 139 mmol/L (ref 135–146)
Total Bilirubin: 0.4 mg/dL (ref 0.2–1.2)
Total Protein: 7.7 g/dL (ref 6.1–8.1)

## 2019-07-23 LAB — HEMOGLOBIN A1C
Hgb A1c MFr Bld: 8.5 % of total Hgb — ABNORMAL HIGH (ref <5.7)
Mean Plasma Glucose: 197 (calc)
eAG (mmol/L): 10.9 (calc)

## 2019-07-23 LAB — LIPID PANEL
Cholesterol: 185 mg/dL (ref <200)
HDL: 75 mg/dL (ref >=50)
LDL Cholesterol (Calc): 94 mg/dL (calc)
Non-HDL Cholesterol (Calc): 110 mg/dL (calc) (ref <130)
Total CHOL/HDL Ratio: 2.5 (calc) (ref <5.0)
Triglycerides: 71 mg/dL (ref <150)

## 2019-07-23 LAB — MICROALBUMIN, URINE: Microalb, Ur: 16.6 mg/dL

## 2019-08-06 ENCOUNTER — Telehealth: Payer: Self-pay

## 2019-08-06 NOTE — Telephone Encounter (Signed)
Please advise 

## 2019-08-06 NOTE — Telephone Encounter (Signed)
Pt is calling regarding her Labs, I show that they are back, but not released\noted from the Dr..Marland Kitchen

## 2019-08-07 NOTE — Telephone Encounter (Signed)
The labs must have gone to Dr Moshe Cipro. I will review once I can.

## 2019-08-20 ENCOUNTER — Telehealth: Payer: Self-pay

## 2019-08-20 NOTE — Telephone Encounter (Signed)
Labs drawn in May do not have a result note on them.

## 2019-08-20 NOTE — Telephone Encounter (Signed)
Cholesterol, liver and kidney are excellent, blood sugar has increased. Please schedule oV with me next week, needs to bring her meds , she needs to start testing and recording blood sugar daily

## 2019-08-20 NOTE — Telephone Encounter (Signed)
Patient aware of results. Appointment scheduled. Patient aware to bring in all meds and bg log to appointment.

## 2019-08-20 NOTE — Telephone Encounter (Signed)
Pt LVM that she is requesting someone call her with the latest Lab report

## 2019-08-24 ENCOUNTER — Other Ambulatory Visit (HOSPITAL_COMMUNITY): Payer: Self-pay | Admitting: Family Medicine

## 2019-08-24 DIAGNOSIS — Z1231 Encounter for screening mammogram for malignant neoplasm of breast: Secondary | ICD-10-CM

## 2019-08-25 ENCOUNTER — Ambulatory Visit (INDEPENDENT_AMBULATORY_CARE_PROVIDER_SITE_OTHER): Payer: Medicare Other | Admitting: Family Medicine

## 2019-08-25 ENCOUNTER — Encounter: Payer: Self-pay | Admitting: Family Medicine

## 2019-08-25 ENCOUNTER — Other Ambulatory Visit: Payer: Self-pay

## 2019-08-25 ENCOUNTER — Encounter: Payer: Self-pay | Admitting: Internal Medicine

## 2019-08-25 VITALS — BP 124/70 | HR 75 | Temp 97.2°F | Resp 16 | Ht 61.0 in | Wt 185.0 lb

## 2019-08-25 DIAGNOSIS — E1149 Type 2 diabetes mellitus with other diabetic neurological complication: Secondary | ICD-10-CM

## 2019-08-25 DIAGNOSIS — I10 Essential (primary) hypertension: Secondary | ICD-10-CM | POA: Diagnosis not present

## 2019-08-25 DIAGNOSIS — R1013 Epigastric pain: Secondary | ICD-10-CM

## 2019-08-25 DIAGNOSIS — E785 Hyperlipidemia, unspecified: Secondary | ICD-10-CM | POA: Diagnosis not present

## 2019-08-25 DIAGNOSIS — R112 Nausea with vomiting, unspecified: Secondary | ICD-10-CM | POA: Insufficient documentation

## 2019-08-25 DIAGNOSIS — M25561 Pain in right knee: Secondary | ICD-10-CM | POA: Insufficient documentation

## 2019-08-25 DIAGNOSIS — M25562 Pain in left knee: Secondary | ICD-10-CM

## 2019-08-25 DIAGNOSIS — R1032 Left lower quadrant pain: Secondary | ICD-10-CM

## 2019-08-25 DIAGNOSIS — G8929 Other chronic pain: Secondary | ICD-10-CM

## 2019-08-25 MED ORDER — ROSUVASTATIN CALCIUM 5 MG PO TABS: ORAL_TABLET | ORAL | 3 refills | Status: DC

## 2019-08-25 MED ORDER — METRONIDAZOLE 500 MG PO TABS: 500.0000 mg | ORAL_TABLET | Freq: Three times a day (TID) | ORAL | 0 refills | Status: DC

## 2019-08-25 MED ORDER — ONDANSETRON HCL 4 MG/2ML IJ SOLN
4.0000 mg | Freq: Once | INTRAMUSCULAR | Status: AC
  Administered 2019-08-25: 4 mg via INTRAMUSCULAR

## 2019-08-25 MED ORDER — ONDANSETRON HCL 4 MG PO TABS: 4.0000 mg | ORAL_TABLET | Freq: Three times a day (TID) | ORAL | 0 refills | Status: DC | PRN

## 2019-08-25 NOTE — Assessment & Plan Note (Addendum)
Referred to GI for evaluation as this is daily disabling discomfort with bloating.Pt to resume PPI

## 2019-08-25 NOTE — Assessment & Plan Note (Signed)
Current mild flare , needs short course of ibuprofen , may take 3 then none

## 2019-08-25 NOTE — Assessment & Plan Note (Signed)
Obesity linked hypertension with diabetes  Patient re-educated about  the importance of commitment to a  minimum of 150 minutes of exercise per week as able.  The importance of healthy food choices with portion control discussed, as well as eating regularly and within a 12 hour window most days. The need to choose "clean , green" food 50 to 75% of the time is discussed, as well as to make water the primary drink and set a goal of 64 ounces water daily.    Weight /BMI 08/25/2019 05/21/2019 04/23/2019  WEIGHT 185 lb 188 lb 1.9 oz 186 lb 1.9 oz  HEIGHT 5\' 1"  5\' 1"  5\' 1"   BMI 34.96 kg/m2 35.54 kg/m2 35.17 kg/m2

## 2019-08-25 NOTE — Progress Notes (Signed)
Michaela Peterson     MRN: [id number]      DOB: November 14, 1955   HPI Michaela Peterson is here for follow up of uncontrolled blood sugar Denies polyuria, polydipsia, blurred vision , or hypoglycemic episodes. LLQ abdominal pain rated at 6 x 1 week, cramping up to a 10, no blood or mucus in stool , no fever or chills, stool appears to have oil in it intermittently. Nausea, vomited once last week. Loose stool up to 5 / 6 per day Bilateral knee pain and stiffness, no buckling, right worse than left , holding on Ortho appt now Non compliant with diet and also with medication ROS Denies recent fever or chills. Denies sinus pressure, nasal congestion, ear pain or sore throat. Denies chest congestion, productive cough or wheezing. Denies chest pains, palpitations and leg swelling .   Denies dysuria, frequency, hesitancy or incontinence.  Denies headaches, seizures, numbness, or tingling. Denies depression, anxiety or insomnia. Denies skin break down or rash.   PE  BP 124/70   Pulse 75   Temp (!) 97.2 F (36.2 C) (Temporal)   Resp 16   Ht 5\' 1"  (1.549 m)   Wt 185 lb (83.9 kg)   LMP 08/18/2012   SpO2 98%   BMI 34.96 kg/m   Patient alert and oriented and in no cardiopulmonary distress.  HEENT: No facial asymmetry, EOMI,     Neck supple .  Chest: Clear to auscultation bilaterally.  CVS: S1, S2 no murmurs, no S3.Regular rate.  ABD: Soft left lower quadrant superficial tenderness, no guarding or rebound. Normal BS.   Ext: No edema  MS: Adequate ROM spine, shoulders, hips and reduced in  knees.  Skin: Intact, no ulcerations or rash noted.  Psych: Good eye contact, normal affect. Memory intact not anxious or depressed appearing.  CNS: CN 2-12 intact, power,  normal throughout.no focal deficits noted.   Assessment & Plan  Hypertension goal BP (blood pressure) < 140/90 Controlled, no change in medication DASH diet and commitment to daily physical activity for a minimum of 30 minutes  discussed and encouraged, as a part of hypertension management. The importance of attaining a healthy weight is also discussed.  BP/Weight 08/25/2019 05/21/2019 04/23/2019 04/22/2019 12/17/2018 11/12/2018 8/36/6294  Systolic BP 765 465 035 465 681 275 170  Diastolic BP 70 74 70 78 70 67 70  Wt. (Lbs) 185 188.12 186.12 187.4 186 187 187  BMI 34.96 35.54 35.17 35.41 35.14 35.33 35.33       Type 2 diabetes mellitus with neurological complications (HCC) Uncontrolled, counseled for 10 minutes re need to change diet and commit to exercise Ms. Gaddie is reminded of the importance of commitment to daily physical activity for 30 minutes or more, as able and the need to limit carbohydrate intake to 30 to 60 grams per meal to help with blood sugar control.   The need to take medication as prescribed, test blood sugar as directed, and to call between visits if there is a concern that blood sugar is uncontrolled is also discussed.   Ms. Venuti is reminded of the importance of daily foot exam, annual eye examination, and good blood sugar, blood pressure and cholesterol control.  Diabetic Labs Latest Ref Rng & Units 07/22/2019 04/03/2019 12/10/2018 08/22/2018 05/15/2018  HbA1c <5.7 % of total Hgb 8.5(H) 7.8(H) 8.8(H) 9.9(H) 9.0(H)  Microalbumin mg/dL 16.6 - - - -  Micro/Creat Ratio 0.0 - 30.0 mg/g creat - - - - -  Chol <200 mg/dL 185 -  148 - 169  HDL > OR = 50 mg/dL 75 - 57 - 63  Calc LDL mg/dL (calc) 94 - 76 - 90  Triglycerides <150 mg/dL 71 - 73 - 69  Creatinine 0.50 - 0.99 mg/dL 0.87 0.88 0.97 0.97 0.96   BP/Weight 08/25/2019 05/21/2019 04/23/2019 04/22/2019 12/17/2018 11/12/2018 9/56/3875  Systolic BP 643 329 518 841 660 630 160  Diastolic BP 70 74 70 78 70 67 70  Wt. (Lbs) 185 188.12 186.12 187.4 186 187 187  BMI 34.96 35.54 35.17 35.41 35.14 35.33 35.33   Foot/eye exam completion dates Latest Ref Rng & Units 02/19/2018 01/20/2018  Eye Exam No Retinopathy - Retinopathy(A)  Foot Form Completion - Done -         Morbid obesity ([place]) Obesity linked hypertension with diabetes  Patient re-educated about  the importance of commitment to a  minimum of 150 minutes of exercise per week as able.  The importance of healthy food choices with portion control discussed, as well as eating regularly and within a 12 hour window most days. The need to choose "clean , green" food 50 to 75% of the time is discussed, as well as to make water the primary drink and set a goal of 64 ounces water daily.    Weight /BMI 08/25/2019 05/21/2019 04/23/2019  WEIGHT 185 lb 188 lb 1.9 oz 186 lb 1.9 oz  HEIGHT 5\' 1"  5\' 1"  5\' 1"   BMI 34.96 kg/m2 35.54 kg/m2 35.17 kg/m2      Hyperlipidemia LDL goal <100 Hyperlipidemia:Low fat diet discussed and encouraged. Resume twice weekly statin for CAD risk reduction  Lipid Panel  Lab Results  Component Value Date   CHOL 185 07/22/2019   HDL 75 07/22/2019   LDLCALC 94 07/22/2019   TRIG 71 07/22/2019   CHOLHDL 2.5 07/22/2019   Controlled, no change in medication    Knee pain, bilateral Current mild flare , needs short course of ibuprofen , may take 3 then none  Nausea & vomiting Zofran 4 mg IM in office and oral Zofran as needed short-term.  Colicky LLQ abdominal pain Presumed mild diverticulitis.  flagyll  X 5 days, also refer to GI, no fever , rectal blood or rebound tenderness  Chronic epigastric pain Referred to GI for evaluation as this is daily disabling discomfort with bloating.Pt to resume PPI

## 2019-08-25 NOTE — Assessment & Plan Note (Signed)
Zofran 4 mg IM in office and oral Zofran as needed short-term.

## 2019-08-25 NOTE — Assessment & Plan Note (Signed)
Uncontrolled, counseled for 10 minutes re need to change diet and commit to exercise Michaela Peterson is reminded of the importance of commitment to daily physical activity for 30 minutes or more, as able and the need to limit carbohydrate intake to 30 to 60 grams per meal to help with blood sugar control.   The need to take medication as prescribed, test blood sugar as directed, and to call between visits if there is a concern that blood sugar is uncontrolled is also discussed.   Michaela Peterson is reminded of the importance of daily foot exam, annual eye examination, and good blood sugar, blood pressure and cholesterol control.  Diabetic Labs Latest Ref Rng & Units 07/22/2019 04/03/2019 12/10/2018 08/22/2018 05/15/2018  HbA1c <5.7 % of total Hgb 8.5(H) 7.8(H) 8.8(H) 9.9(H) 9.0(H)  Microalbumin mg/dL 16.6 - - - -  Micro/Creat Ratio 0.0 - 30.0 mg/g creat - - - - -  Chol <200 mg/dL 185 - 148 - 169  HDL > OR = 50 mg/dL 75 - 57 - 63  Calc LDL mg/dL (calc) 94 - 76 - 90  Triglycerides <150 mg/dL 71 - 73 - 69  Creatinine 0.50 - 0.99 mg/dL 0.87 0.88 0.97 0.97 0.96   BP/Weight 08/25/2019 05/21/2019 04/23/2019 04/22/2019 12/17/2018 11/12/2018 07/27/15  Systolic BP 494 496 759 163 846 659 935  Diastolic BP 70 74 70 78 70 67 70  Wt. (Lbs) 185 188.12 186.12 187.4 186 187 187  BMI 34.96 35.54 35.17 35.41 35.14 35.33 35.33   Foot/eye exam completion dates Latest Ref Rng & Units 02/19/2018 01/20/2018  Eye Exam No Retinopathy - Retinopathy(A)  Foot Form Completion - Done -

## 2019-08-25 NOTE — Assessment & Plan Note (Addendum)
Presumed mild diverticulitis.  flagyll  X 5 days, also refer to GI, no fever , rectal blood or rebound tenderness

## 2019-08-25 NOTE — Assessment & Plan Note (Signed)
Controlled, no change in medication DASH diet and commitment to daily physical activity for a minimum of 30 minutes discussed and encouraged, as a part of hypertension management. The importance of attaining a healthy weight is also discussed.  BP/Weight 08/25/2019 05/21/2019 04/23/2019 04/22/2019 12/17/2018 11/12/2018 1/58/6825  Systolic BP 749 355 217 471 595 396 728  Diastolic BP 70 74 70 78 70 67 70  Wt. (Lbs) 185 188.12 186.12 187.4 186 187 187  BMI 34.96 35.54 35.17 35.41 35.14 35.33 35.33

## 2019-08-25 NOTE — Assessment & Plan Note (Addendum)
Hyperlipidemia:Low fat diet discussed and encouraged. Resume twice weekly statin for CAD risk reduction  Lipid Panel  Lab Results  Component Value Date   CHOL 185 07/22/2019   HDL 75 07/22/2019   LDLCALC 94 07/22/2019   TRIG 71 07/22/2019   CHOLHDL 2.5 07/22/2019   Controlled, no change in medication

## 2019-08-25 NOTE — Patient Instructions (Addendum)
F/u with MD 08/15 or shortly after , call if you need me sooner  Nonfasting HbA1c Chem-7 and EGFR to be obtained August 12 or shortly after.  Zofran 4 mg IM in office today for nausea with one episode of vomiting and a short course of Zofran tablets are also prescribed.  For presumed mild diverticulitis causing left lower quadrant pain 1 week course of metronidazole is prescribed.  You are also referred to your gastroenterologist.  For chronic epigastric pain and bloating you are referred to your gastroenterologist and I do recommend you continue your reflux medication.  Please resume your cholesterol medicine but now take it twice weekly every Monday and Friday.  Blood sugar is uncontrolled and has deteriorated.  Please do commit daily to change in food choices and taking medications as prescribed.  Also commits to physical activity for 20 to 30 minutes each day.  Re possible arthritis in the hands and fingers I will address this at the next visit.  Thanks for choosing [facility], we consider it a privelige to serve you.

## 2019-09-18 ENCOUNTER — Other Ambulatory Visit: Payer: Self-pay

## 2019-09-18 ENCOUNTER — Encounter: Payer: Self-pay | Admitting: Internal Medicine

## 2019-09-18 ENCOUNTER — Ambulatory Visit: Payer: Medicare Other | Admitting: Internal Medicine

## 2019-09-18 VITALS — BP 122/78 | HR 70 | Temp 98.0°F | Ht 61.0 in | Wt 185.2 lb

## 2019-09-18 DIAGNOSIS — G4733 Obstructive sleep apnea (adult) (pediatric): Secondary | ICD-10-CM | POA: Diagnosis not present

## 2019-09-18 NOTE — Progress Notes (Signed)
HPI female never smoker followed for OSA/ insomnia, complicated by HBP, GERD, DM 2/gastroparesis, Multiple Sclerosis Home Sleep Test- 08/27/16-AHI 25.4/hour, desaturation to 80%, body weight 184.6 pounds ----------------------------------------------------------------------------------------   09/18/2018- 64 year old female never smoker followed for OSA/ insomnia, complicated by HBP, GERD, DM 2/gastroparesis, Multiple Sclerosis CPAP auto 5-12/ [facility] Body weight today 187 lbs Download- compliance 90%, AHI 5.0/ hr -----OSA on CPAP 5-12, DME: [facility]; pt reports having issues w/ seal of mask Discussed mask fit. Still sleeps better w CPAP. Not tolerating hot/ humid weather well at all and sensitive to "chemicals"- cleaning products, strong odors. Questions past hx asthma.  09/18/19- 63 year old female never smoker(+lives w smoker) followed for OSA/ insomnia, complicated by HBP, GERD, DM 2/gastroparesis, Multiple Sclerosis CPAP auto 5-12/ [facility] Download compliance 37%- recorded use starts 6/24, AHI 7/ hr Body weight today 185 lbs Had 2 Moderna Covax Wasn't using CPAP for a while because too hot with full face mask and she says husband doesn't like air conditioning. Discussed options and medical reasons for CPAP. Denies new health problems.    ROS-see HPI   + = positive Constitutional:    weight loss, night sweats, fevers, chills, + fatigue, lassitude. HEENT:    headaches, difficulty swallowing, tooth/dental problems, sore throat,       sneezing, itching, ear ache, nasal congestion, post nasal drip, snoring CV:    chest pain, orthopnea, PND, swelling in lower extremities, anasarca,                                   dizziness, palpitations Resp:   +shortness of breath with exertion or at rest.                productive cough,   non-productive cough, coughing up of blood.              change in color of mucus.  wheezing.   Skin:    rash or lesions. GI:  No-   heartburn, indigestion, abdominal  pain, nausea, vomiting, diarrhea,                 change in bowel habits, loss of appetite GU: dysuria, change in color of urine, no urgency or frequency.   flank pain. MS:   joint pain, stiffness, decreased range of motion, back pain. Neuro-     nothing unusual Psych:  change in mood or affect.  depression or anxiety.   memory loss.  OBJ- Physical Exam General- Alert, Oriented, Affect-appropriate, Distress- none acute, + obese Skin- rash-none, lesions- none, excoriation- none Lymphadenopathy- none Head- atraumatic            Eyes- Gross vision intact, PERRLA, conjunctivae and secretions clear            Ears- Hearing, canals-normal            Nose- Clear, no-Septal dev, mucus, polyps, erosion, perforation             Throat- Mallampati IV , mucosa clear , drainage- none, tonsils- atrophic, +no upper teeth Neck- flexible , trachea midline, no stridor , thyroid nl, carotid no bruit Chest - symmetrical excursion , unlabored           Heart/CV- RRR , no murmur , no gallop  , no rub, nl s1 s2                           -  JVD- none , edema- none, stasis changes- none, varices- none           Lung- clear to P&A, wheeze- none, cough- none , dullness-none, rub- none           Chest wall-  Abd-  Br/ Gen/ Rectal- Not done, not indicated Extrem- cyanosis- none, clubbing, none, atrophy- none, strength- nl Neuro- grossly intact to observation

## 2019-09-18 NOTE — Assessment & Plan Note (Signed)
Benefits from CPAP when used. Discussed comfort and alternative therapies. Plan- mask fitting, small fan in bedroom for her, use CPAP every night

## 2019-09-18 NOTE — Assessment & Plan Note (Signed)
Continue working on weight with attention to diet and more exercise.

## 2019-09-18 NOTE — Patient Instructions (Signed)
Order- refer to sleep center for mask fitting    Dx OSA  Try using a small fan to help you stay cooler  It is important to use your CPAP. If it is too hard to make it comfortable, please let me know so we can decide what to do.

## 2019-09-22 DIAGNOSIS — L110 Acquired keratosis follicularis: Secondary | ICD-10-CM | POA: Diagnosis not present

## 2019-09-22 DIAGNOSIS — E1140 Type 2 diabetes mellitus with diabetic neuropathy, unspecified: Secondary | ICD-10-CM | POA: Diagnosis not present

## 2019-09-22 DIAGNOSIS — M79671 Pain in right foot: Secondary | ICD-10-CM | POA: Diagnosis not present

## 2019-09-22 DIAGNOSIS — I739 Peripheral vascular disease, unspecified: Secondary | ICD-10-CM | POA: Diagnosis not present

## 2019-09-22 DIAGNOSIS — M79672 Pain in left foot: Secondary | ICD-10-CM | POA: Diagnosis not present

## 2019-10-01 ENCOUNTER — Ambulatory Visit (HOSPITAL_COMMUNITY)
Admission: RE | Admit: 2019-10-01 | Discharge: 2019-10-01 | Disposition: A | Discharge status: Home / Self Care | Payer: Medicare Other | Source: Ambulatory Visit | Attending: Family Medicine | Admitting: Family Medicine

## 2019-10-01 ENCOUNTER — Other Ambulatory Visit: Payer: Self-pay

## 2019-10-01 DIAGNOSIS — Z1231 Encounter for screening mammogram for malignant neoplasm of breast: Secondary | ICD-10-CM | POA: Insufficient documentation

## 2019-10-07 ENCOUNTER — Other Ambulatory Visit: Payer: Self-pay

## 2019-10-07 DIAGNOSIS — D649 Anemia, unspecified: Secondary | ICD-10-CM

## 2019-10-13 ENCOUNTER — Other Ambulatory Visit: Payer: Self-pay | Admitting: Family Medicine

## 2019-10-16 DIAGNOSIS — D649 Anemia, unspecified: Secondary | ICD-10-CM | POA: Diagnosis not present

## 2019-10-17 LAB — IRON,TIBC AND FERRITIN PANEL
%SAT: 10 % (calc) — ABNORMAL LOW (ref 16–45)
Ferritin: 33 ng/mL (ref 16–288)
Iron: 34 ug/dL — ABNORMAL LOW (ref 45–160)
TIBC: 351 mcg/dL (calc) (ref 250–450)

## 2019-10-19 DIAGNOSIS — D649 Anemia, unspecified: Secondary | ICD-10-CM | POA: Diagnosis not present

## 2019-10-19 LAB — CBC WITH DIFFERENTIAL/PLATELET
Absolute Monocytes: 436 cells/uL (ref 200–950)
Basophils Absolute: 98 cells/uL (ref 0–200)
Basophils Relative: 0.9 %
Eosinophils Absolute: 98 cells/uL (ref 15–500)
Eosinophils Relative: 0.9 %
HCT: 37.3 % (ref 35.0–45.0)
Hemoglobin: 12 g/dL (ref 11.7–15.5)
Lymphs Abs: 4611 cells/uL — ABNORMAL HIGH (ref 850–3900)
MCH: 26.8 pg — ABNORMAL LOW (ref 27.0–33.0)
MCHC: 32.2 g/dL (ref 32.0–36.0)
MCV: 83.4 fL (ref 80.0–100.0)
MPV: 10.4 fL (ref 7.5–12.5)
Monocytes Relative: 4 %
Neutro Abs: 5657 cells/uL (ref 1500–7800)
Neutrophils Relative %: 51.9 %
Platelets: 428 10*3/uL — ABNORMAL HIGH (ref 140–400)
RBC: 4.47 10*6/uL (ref 3.80–5.10)
RDW: 14.6 % (ref 11.0–15.0)
Total Lymphocyte: 42.3 %
WBC: 10.9 10*3/uL — ABNORMAL HIGH (ref 3.8–10.8)

## 2019-10-21 ENCOUNTER — Encounter: Payer: Self-pay | Admitting: Gastroenterology

## 2019-10-21 ENCOUNTER — Ambulatory Visit: Payer: Medicare Other | Admitting: Gastroenterology

## 2019-10-21 ENCOUNTER — Other Ambulatory Visit: Payer: Self-pay

## 2019-10-21 VITALS — BP 128/77 | HR 82 | Temp 97.5°F | Ht 61.0 in | Wt 184.8 lb

## 2019-10-21 DIAGNOSIS — R1013 Epigastric pain: Secondary | ICD-10-CM

## 2019-10-21 NOTE — Progress Notes (Signed)
Referring Provider: [name], MD Primary Care Physician:  [name], MD  Primary GI: Dr. [name]   Chief Complaint  Patient presents with  . Bloated  . vomiting    occ  . Abdominal Pain    mid upper abd    HPI:   Michaela Peterson is a 64 y.o. female presenting today with a history of low normal ferritin, low iron, Hgb 12 range historically. Colonoscopy/EGD in July 2015 and capsule unremarkable June 2016.Constipation, GERD chronic. History of gastroparesis. Mildly elevated transaminases in the past with plans to pursue ultrasound if continues to remain elevated; however, recent LFTs normal.Recent colonoscopy completed due to history of adenomas, with surveillance due in 2025.   Most recent Hgb 12.0. Ferritin 33. Epigastric pain postprandially, associated nausea. Occasional vomiting. Was doing well in Feb 2021 when last seen. Symptoms started after this. This week has not been as bad. No solid food dysphagia. After vomiting, pain relieved. Sometimes early satiety. Not throwing up undigested food. Gallbladder absent. Takes Ibuprofen sparingly for knee pain. Protonix once daily.   Desires to hold off on EGD until she tries Protonix BID. She has been on BID in the past. Stopped taking BID at last appt in Feb 2021.   Past Medical History:  Diagnosis Date  . Adnexal mass    Left; followed by Dr. [name]  . Anemia, iron deficiency   . Arthritis   . At risk for cardiovascular event 06/17/2016  . Chronic low back pain   . Diabetes mellitus, type 2 ([place])   . Dyspnea 09/19/2018  . Essential hypertension   . Gastroparesis   . GERD (gastroesophageal reflux disease)   . MS (multiple sclerosis) ([name])   . Nonalcoholic fatty liver disease   . Obesity   . Obesity, diabetes, and hypertension syndrome ([place]) 03/12/2009   Qualifier: Diagnosis of  By: Moshe Cipro MD, Joycelyn Schmid    . Sleep apnea 2009  . Vaginitis 03/29/2018    Past Surgical History:  Procedure Laterality Date  . AGILE  CAPSULE N/A 08/11/2014   Procedure: AGILE CAPSULE;  Surgeon: Daneil Dolin, MD;  Location: [facility] SUITE;  Service: Endoscopy;  Laterality: N/A;  0700  . BLADDER SUSPENSION    . CESAREAN SECTION     x2   . CHOLECYSTECTOMY    . COLONOSCOPY  2010   Dr. [name]: tubular adenoma, few scattered diverticula  . COLONOSCOPY N/A 10/01/2013   Dr. Pixie Casino preparation. Normal rectum. Normal colonic mucosa  . COLONOSCOPY N/A 11/12/2018   sigmoid and descending colon diverticulosis. Two 4-5 mm polyps in ascending colon. (tubular adenomas). Surveillance in 5 years  . DILATION AND CURETTAGE OF UTERUS     4  . ESOPHAGOGASTRODUODENOSCOPY  2010   Dr. [name]: normal esophagus, small hiatal hernia, questionable pale duodenal mucosa but negative for celiac sprue.   . ESOPHAGOGASTRODUODENOSCOPY N/A 10/01/2013   Dr. Rourk:gastric polyps-status post biopsy. Otherwise, normal EGD. fundic gland polyp, negative H.pylori  . GIVENS CAPSULE STUDY N/A 09/08/2014   Poor prep but overall unremarkable   . KNEE ARTHROSCOPY W/ DEBRIDEMENT    . LAPAROSCOPY ABDOMEN DIAGNOSTIC     lysis of adhesions  . POLYPECTOMY  11/12/2018   Procedure: POLYPECTOMY;  Surgeon: Daneil Dolin, MD;  Location: [facility];  Service: Endoscopy;;  . ROTATOR CUFF REPAIR    . TENDON LENGTHENING     Left wrist  . UMBILICAL HERNIA REPAIR      Current Outpatient Medications  Medication Sig  Dispense Refill  . allopurinol (ZYLOPRIM) 100 MG tablet TAKE 1 TABLET BY MOUTH  DAILY 90 tablet 3  . aspirin EC 81 MG tablet Take 81 mg by mouth daily.    Marland Kitchen azelastine (ASTELIN) 0.1 % nasal spray Place 2 sprays into both nostrils at bedtime. Use in each nostril as directed    . Blood Glucose Monitoring Suppl (ONE TOUCH ULTRA 2) w/Device KIT USE TO TEST ONCE DAILY 1 kit 0  . cetirizine (ZYRTEC) 10 MG tablet Take 10 mg by mouth daily.    . Cholecalciferol (DIALYVITE VITAMIN D 5000) 125 MCG (5000 UT) capsule Take 5,000 Units by mouth 3 (three) times a week.      . diphenhydrAMINE (BENADRYL) 25 MG tablet Take 25 mg by mouth every 6 (six) hours as needed for itching.    Marland Kitchen ELDERBERRY PO Take by mouth daily.    . fluticasone (FLONASE) 50 MCG/ACT nasal spray Place 2 sprays into both nostrils daily.     Marland Kitchen glipiZIDE (GLUCOTROL) 10 MG tablet TAKE 1 TABLET BY MOUTH  TWICE DAILY BEFORE MEALS 180 tablet 3  . ibuprofen (ADVIL) 200 MG tablet Take 400 mg by mouth every 6 (six) hours as needed for headache or moderate pain.    . metFORMIN (GLUCOPHAGE) 1000 MG tablet TAKE 1 TABLET BY MOUTH  TWICE DAILY WITH MEALS 180 tablet 3  . montelukast (SINGULAIR) 10 MG tablet TAKE 1 TABLET BY MOUTH AT  BEDTIME 90 tablet 3  . NIFEdipine (ADALAT CC) 90 MG 24 hr tablet TAKE 1 TABLET BY MOUTH  DAILY 90 tablet 3  . Olopatadine HCl (PATADAY) 0.2 % SOLN Apply 1 drop to eye daily.    . ondansetron (ZOFRAN) 4 MG tablet Take 1 tablet (4 mg total) by mouth every 8 (eight) hours as needed for nausea or vomiting. 20 tablet 0  . ONETOUCH DELICA LANCETS 27P MISC USE UP TO 4 TIMES DAILY AS  DIRECTED 400 each 1  . ONETOUCH ULTRA test strip USE TO TEST ONCE DAILY 100 strip 3  . OVER THE COUNTER MEDICATION Apply 1 application topically daily as needed (pain). horse liniment cream    . pantoprazole (PROTONIX) 40 MG tablet TAKE 1 TABLET BY MOUTH TWO  TIMES DAILY BEFORE MEALS (Patient taking differently: Take 40 mg by mouth daily. ) 180 tablet 3  . rosuvastatin (CRESTOR) 5 MG tablet Take one tablet by mouth every Monday and Friday 32 tablet 3  . triamcinolone cream (KENALOG) 0.1 % Apply 1 application topically daily as needed (rash).    . triamterene-hydrochlorothiazide (MAXZIDE-25) 37.5-25 MG tablet TAKE 1 TABLET BY MOUTH  DAILY 90 tablet 3  . Zinc 50 MG CAPS Take by mouth once a week.      No current facility-administered medications for this visit.    Allergies as of 10/21/2019 - Review Complete 10/21/2019  Allergen Reaction Noted  . Gabapentin Shortness Of Breath 02/13/2017  . Ace inhibitors  Cough 03/26/2012  . Aspirin    . Oxycodone Hives 04/01/2018  . Penicillins Hives   . Rebif [interferon beta-1a] Other (See Comments) 05/01/2018  . Statins  10/11/2012    Family History  Problem Relation Age of Onset  . Hypertension Sister   . Diabetes Sister   . Heart disease Sister   . Kidney failure Sister   . Heart failure Mother   . Hypertension Mother   . Arthritis Mother   . Stroke Father   . Hypertension Son   . Hypertension Sister   .  Hypertension Brother   . Mental illness Brother   . Colon cancer Maternal Aunt     Social History   Socioeconomic History  . Marital status: Married    Spouse name: [name]   . Number of children: 4  . Years of education: 66  . Highest education level: Not on file  Occupational History  . Occupation: unemployed     Fish farm manager: UNEMPLOYED  Tobacco Use  . Smoking status: Never Smoker  . Smokeless tobacco: Never Used  . Tobacco comment: patient lives with a smoker   Vaping Use  . Vaping Use: Never used  Substance and Sexual Activity  . Alcohol use: No  . Drug use: No  . Sexual activity: Yes    Birth control/protection: Post-menopausal  Other Topics Concern  . Not on file  Social History Narrative   Patient lives at home with husband [name].    Patient has 4 children.    Patient has a some college.    Patient is right handed.    Patient is currently not working.    Social Determinants of Health   Financial Resource Strain:   . Difficulty of Paying Living Expenses:   Food Insecurity:   . Worried About Charity fundraiser in the Last Year:   . Arboriculturist in the Last Year:   Transportation Needs:   . Film/video editor (Medical):   Marland Kitchen Lack of Transportation (Non-Medical):   Physical Activity:   . Days of Exercise per Week:   . Minutes of Exercise per Session:   Stress:   . Feeling of Stress :   Social Connections:   . Frequency of Communication with Friends and Family:   . Frequency of Social Gatherings with  Friends and Family:   . Attends Religious Services:   . Active Member of Clubs or Organizations:   . Attends Archivist Meetings:   Marland Kitchen Marital Status:     Review of Systems: Gen: Denies fever, chills, anorexia. Denies fatigue, weakness, weight loss.  CV: Denies chest pain, palpitations, syncope, peripheral edema, and claudication. Resp: Denies dyspnea at rest, cough, wheezing, coughing up blood, and pleurisy. GI: see HPI Derm: Denies rash, itching, dry skin Psych: Denies depression, anxiety, memory loss, confusion. No homicidal or suicidal ideation.  Heme: Denies bruising, bleeding, and enlarged lymph nodes.  Physical Exam: BP 128/77   Pulse 82   Temp (!) 97.5 F (36.4 C) (Temporal)   Ht 5' 1"  (1.549 m)   Wt 184 lb 12.8 oz (83.8 kg)   LMP 08/18/2012   BMI 34.92 kg/m  General:   Alert and oriented. No distress noted. Pleasant and cooperative.  Head:  Normocephalic and atraumatic. Eyes:  Conjuctiva clear without scleral icterus. Mouth:  Mask in place Abdomen:  +BS, soft, mild TTP epigastric and LUQ and non-distended. No rebound or guarding. No HSM or masses noted. Msk:  Symmetrical without gross deformities. Normal posture. Extremities:  Without edema. Neurologic:  Alert and  oriented x4 Psych:  Alert and cooperative. Normal mood and affect.  Lab Results  Component Value Date   WBC 10.9 (H) 10/19/2019   HGB 12.0 10/19/2019   HCT 37.3 10/19/2019   MCV 83.4 10/19/2019   PLT 428 (H) 10/19/2019   Lab Results  Component Value Date   IRON 34 (L) 10/16/2019   TIBC 351 10/16/2019   FERRITIN 33 10/16/2019     ASSESSMENT: MAISON AGRUSA is a 64 y.o. female presenting today with history of  anemia, constipation, GERD, fatty liver, now with dyspepsia onset since last visit.   Notes epigastric pain postprandially intermittently, associated N/V at times. Last EGD in 2015. Taking Ibuprofen sparingly. I recommended EGD, but she would like to try increasing Protonix to BID  first. Query gastritis, PUD, doubt malignancy, gallbladder absent. Known history of gastroparesis but typically would not have pain with this. I do note her A1c was 8.5 several months ago.   Constipation: not ideally managed. Start Benefiber as previously recommended.   Anemia: resolved.    PLAN:   Recommend EGD but she desires to hold off until trial of BID PPI, which she has been on prior  Call in 3-4 weeks with progress report; if no improvement, needs EGD  Benefiber daily  Return in 3-4 months regardless   Annitta Needs, PhD, ANP-BC [facility]

## 2019-10-21 NOTE — Progress Notes (Signed)
Cc'ed to pcp °

## 2019-10-21 NOTE — Patient Instructions (Addendum)
Let's increase Protonix to twice a day, 30 minutes before breakfast and dinner.   Please call me in 3-4 weeks with how this is working; if no improvement, we need to do an upper endoscopy.  I recommend Benefiber or similar 2 teaspoons each morning. You can increase this to 3 times a day as tolerated.  We will see you in 3-4 months regardless!  I enjoyed seeing you again today! As you know, I value our relationship and want to provide genuine, compassionate, and quality care. I welcome your feedback. If you receive a survey regarding your visit,  I greatly appreciate you taking time to fill this out. See you next time!  Annitta Needs, PhD, ANP-BC [facility]   Food Choices for Gastroesophageal Reflux Disease, Adult When you have gastroesophageal reflux disease (GERD), the foods you eat and your eating habits are very important. Choosing the right foods can help ease the discomfort of GERD. Consider working with a diet and nutrition specialist (dietitian) to help you make healthy food choices. What general guidelines should I follow?  Eating plan  Choose healthy foods low in fat, such as fruits, vegetables, whole grains, low-fat dairy products, and lean meat, fish, and poultry.  Eat frequent, small meals instead of three large meals each day. Eat your meals slowly, in a relaxed setting. Avoid bending over or lying down until 2-3 hours after eating.  Limit high-fat foods such as fatty meats or fried foods.  Limit your intake of oils, butter, and shortening to less than 8 teaspoons each day.  Avoid the following: ? Foods that cause symptoms. These may be different for different people. Keep a food diary to keep track of foods that cause symptoms. ? Alcohol. ? Drinking large amounts of liquid with meals. ? Eating meals during the 2-3 hours before bed.  Cook foods using methods other than frying. This may include baking, grilling, or broiling. Lifestyle  Maintain a  healthy weight. Ask your health care provider what weight is healthy for you. If you need to lose weight, work with your health care provider to do so safely.  Exercise for at least 30 minutes on 5 or more days each week, or as told by your health care provider.  Avoid wearing clothes that fit tightly around your waist and chest.  Do not use any products that contain nicotine or tobacco, such as cigarettes and e-cigarettes. If you need help quitting, ask your health care provider.  Sleep with the head of your bed raised. Use a wedge under the mattress or blocks under the bed frame to raise the head of the bed. What foods are not recommended? The items listed may not be a complete list. Talk with your dietitian about what dietary choices are best for you. Grains Pastries or quick breads with added fat. [place] toast. Vegetables Deep fried vegetables. [place] fries. Any vegetables prepared with added fat. Any vegetables that cause symptoms. For some people this may include tomatoes and tomato products, chili peppers, onions and garlic, and horseradish. Fruits Any fruits prepared with added fat. Any fruits that cause symptoms. For some people this may include citrus fruits, such as oranges, grapefruit, pineapple, and lemons. Meats and other protein foods High-fat meats, such as fatty beef or pork, hot dogs, ribs, ham, sausage, salami and bacon. Fried meat or protein, including fried fish and fried chicken. Nuts and nut butters. Dairy Whole milk and chocolate milk. Sour cream. Cream. Ice cream. Cream cheese. Milk shakes. Beverages Coffee  and tea, with or without caffeine. Carbonated beverages. Sodas. Energy drinks. Fruit juice made with acidic fruits (such as orange or grapefruit). Tomato juice. Alcoholic drinks. Fats and oils Butter. Margarine. Shortening. Ghee. Sweets and desserts Chocolate and cocoa. Donuts. Seasoning and other foods Pepper. Peppermint and spearmint. Any condiments, herbs,  or seasonings that cause symptoms. For some people, this may include curry, hot sauce, or vinegar-based salad dressings. Summary  When you have gastroesophageal reflux disease (GERD), food and lifestyle choices are very important to help ease the discomfort of GERD.  Eat frequent, small meals instead of three large meals each day. Eat your meals slowly, in a relaxed setting. Avoid bending over or lying down until 2-3 hours after eating.  Limit high-fat foods such as fatty meat or fried foods. This information is not intended to replace advice given to you by your health care provider. Make sure you discuss any questions you have with your health care provider. Document Revised: 06/19/2018 Document Reviewed: 02/28/2016 Elsevier Patient Education  [place].

## 2019-10-29 DIAGNOSIS — E1149 Type 2 diabetes mellitus with other diabetic neurological complication: Secondary | ICD-10-CM | POA: Diagnosis not present

## 2019-10-30 LAB — BASIC METABOLIC PANEL WITH GFR
BUN: 15 mg/dL (ref 7–25)
CO2: 27 mmol/L (ref 20–32)
Calcium: 9.8 mg/dL (ref 8.6–10.4)
Chloride: 105 mmol/L (ref 98–110)
Creat: 0.91 mg/dL (ref 0.50–0.99)
GFR, Est African American: 78 mL/min/{1.73_m2} (ref >=60)
GFR, Est Non African American: 67 mL/min/{1.73_m2} (ref >=60)
Glucose, Bld: 118 mg/dL — ABNORMAL HIGH (ref 65–99)
Potassium: 4.1 mmol/L (ref 3.5–5.3)
Sodium: 141 mmol/L (ref 135–146)

## 2019-10-30 LAB — HEMOGLOBIN A1C
Hgb A1c MFr Bld: 8.9 % of total Hgb — ABNORMAL HIGH (ref <5.7)
Mean Plasma Glucose: 209 (calc)
eAG (mmol/L): 11.6 (calc)

## 2019-11-03 ENCOUNTER — Telehealth: Payer: Self-pay

## 2019-11-03 ENCOUNTER — Encounter: Payer: Self-pay | Admitting: Family Medicine

## 2019-11-03 ENCOUNTER — Other Ambulatory Visit: Payer: Self-pay

## 2019-11-03 ENCOUNTER — Ambulatory Visit (INDEPENDENT_AMBULATORY_CARE_PROVIDER_SITE_OTHER): Payer: Medicare Other | Admitting: Family Medicine

## 2019-11-03 VITALS — BP 130/72 | HR 76 | Resp 16 | Ht 61.0 in | Wt 185.0 lb

## 2019-11-03 DIAGNOSIS — Z23 Encounter for immunization: Secondary | ICD-10-CM | POA: Diagnosis not present

## 2019-11-03 DIAGNOSIS — I10 Essential (primary) hypertension: Secondary | ICD-10-CM

## 2019-11-03 DIAGNOSIS — M79644 Pain in right finger(s): Secondary | ICD-10-CM | POA: Diagnosis not present

## 2019-11-03 DIAGNOSIS — M79641 Pain in right hand: Secondary | ICD-10-CM | POA: Insufficient documentation

## 2019-11-03 DIAGNOSIS — E785 Hyperlipidemia, unspecified: Secondary | ICD-10-CM

## 2019-11-03 DIAGNOSIS — E11649 Type 2 diabetes mellitus with hypoglycemia without coma: Secondary | ICD-10-CM | POA: Diagnosis not present

## 2019-11-03 DIAGNOSIS — M79642 Pain in left hand: Secondary | ICD-10-CM

## 2019-11-03 DIAGNOSIS — M79645 Pain in left finger(s): Secondary | ICD-10-CM

## 2019-11-03 DIAGNOSIS — E1149 Type 2 diabetes mellitus with other diabetic neurological complication: Secondary | ICD-10-CM

## 2019-11-03 MED ORDER — AZELASTINE HCL 0.1 % NA SOLN: 2.0000 | Freq: Every day | NASAL | 3 refills | Status: DC

## 2019-11-03 MED ORDER — SITAGLIP PHOS-METFORMIN HCL ER 50-1000 MG PO TB24: ORAL_TABLET | ORAL | 5 refills | Status: DC

## 2019-11-03 NOTE — Telephone Encounter (Signed)
Pt LVM that the Medication Janumet is to high and  it does not come in Generic form

## 2019-11-03 NOTE — Patient Instructions (Addendum)
Anual physical exam and f/u uncontrolled diabetes in 6 to 8 weeks , with MD, call if you need me sooner   NEW medication for diabetes is Janumet 50/1000 take 2 tablets once daily.  Do not take Metformin when you start taking Janumet as the Metformin is already in the Janumet tablet.Please leave a note with [place] today afdter you see from the pharmacy if your insurance covers janumet, need to start this as soon as possible. Flu vaccine today Test blood sugar once every day and record.  Fasting blood sugar before breakfast or any meals should be between 80-1 30 bedtime blood sugar should be 1 30-1 70.     You are referred to diabetic educator.  It is vital that you take your blood sugar seriously so that you do not end up on dialysis or with heart disease or stroke.  X-ray of both hands today to evaluate joint pain.  It is important that you exercise regularly at least 30 minutes 5 times a week. If you develop chest pain, have severe difficulty breathing, or feel very tired, stop exercising immediately and seek medical attention  Think about what you will eat, plan ahead. Choose " clean, green, fresh or frozen" over canned, processed or packaged foods which are more sugary, salty and fatty. 70 to 75% of food eaten should be vegetables and fruit. Three meals at set times with snacks allowed between meals, but they must be fruit or vegetables. Aim to eat over a 12 hour period , example 7 am to 7 pm, and STOP after  your last meal of the day. Drink water,generally about 64 ounces per day, no other drink is as healthy. Fruit juice is best enjoyed in a healthy way, by EATING the fruit. Thanks for choosing [facility], we consider it a privelige to serve you.

## 2019-11-04 ENCOUNTER — Ambulatory Visit (HOSPITAL_COMMUNITY)
Admission: RE | Admit: 2019-11-04 | Discharge: 2019-11-04 | Disposition: A | Discharge status: Home / Self Care | Payer: Medicare Other | Source: Ambulatory Visit | Attending: Family Medicine | Admitting: Family Medicine

## 2019-11-04 ENCOUNTER — Other Ambulatory Visit: Payer: Self-pay | Admitting: Family Medicine

## 2019-11-04 DIAGNOSIS — M79644 Pain in right finger(s): Secondary | ICD-10-CM | POA: Diagnosis not present

## 2019-11-04 DIAGNOSIS — M79645 Pain in left finger(s): Secondary | ICD-10-CM | POA: Diagnosis not present

## 2019-11-04 DIAGNOSIS — M79642 Pain in left hand: Secondary | ICD-10-CM | POA: Insufficient documentation

## 2019-11-04 DIAGNOSIS — M79641 Pain in right hand: Secondary | ICD-10-CM | POA: Diagnosis not present

## 2019-11-04 DIAGNOSIS — M19041 Primary osteoarthritis, right hand: Secondary | ICD-10-CM | POA: Diagnosis not present

## 2019-11-04 DIAGNOSIS — M19042 Primary osteoarthritis, left hand: Secondary | ICD-10-CM | POA: Diagnosis not present

## 2019-11-04 MED ORDER — SAXAGLIPTIN-METFORMIN ER 5-1000 MG PO TB24: ORAL_TABLET | ORAL | 5 refills | Status: DC

## 2019-11-04 NOTE — Telephone Encounter (Signed)
Called patient and left message for them to return call at the office   

## 2019-11-04 NOTE — Telephone Encounter (Signed)
I checked her formulary and the meds listed that may be covered are: Tradjenta, kombiglyze XR 2.07-998, Kazano 12.5-1000mg .

## 2019-11-04 NOTE — Telephone Encounter (Signed)
I have sent an alternative, if this is also not covered, she stays on the metformin only, please continue to follow up and let me know. thanks

## 2019-11-05 ENCOUNTER — Encounter: Payer: Self-pay | Admitting: Family Medicine

## 2019-11-05 ENCOUNTER — Telehealth: Payer: Self-pay | Admitting: *Deleted

## 2019-11-05 ENCOUNTER — Telehealth: Payer: Self-pay

## 2019-11-05 NOTE — Assessment & Plan Note (Signed)
  Patient re-educated about  the importance of commitment to a  minimum of 150 minutes of exercise per week as able.  The importance of healthy food choices with portion control discussed, as well as eating regularly and within a 12 hour window most days. The need to choose "clean , green" food 50 to 75% of the time is discussed, as well as to make water the primary drink and set a goal of 64 ounces water daily.    Weight /BMI 11/03/2019 10/21/2019 09/18/2019  WEIGHT 185 lb 184 lb 12.8 oz 185 lb 3.2 oz  HEIGHT 5\' 1"  5\' 1"  5\' 1"   BMI 34.96 kg/m2 34.92 kg/m2 34.99 kg/m2

## 2019-11-05 NOTE — Assessment & Plan Note (Signed)
Bilateral hand pain and finger joint swelling and stiffness, x ray of both likely arthritis

## 2019-11-05 NOTE — Assessment & Plan Note (Signed)
Deteriorated and uncontrolled, needs diabetic education and change metformin to combination med if possible Michaela Peterson is reminded of the importance of commitment to daily physical activity for 30 minutes or more, as able and the need to limit carbohydrate intake to 30 to 60 grams per meal to help with blood sugar control.   The need to take medication as prescribed, test blood sugar as directed, and to call between visits if there is a concern that blood sugar is uncontrolled is also discussed.   [name] is reminded of the importance of daily foot exam, annual eye examination, and good blood sugar, blood pressure and cholesterol control.  Diabetic Labs Latest Ref Rng & Units 10/29/2019 07/22/2019 04/03/2019 12/10/2018 08/22/2018  HbA1c <5.7 % of total Hgb 8.9(H) 8.5(H) 7.8(H) 8.8(H) 9.9(H)  Microalbumin mg/dL - 16.6 - - -  Micro/Creat Ratio 0.0 - 30.0 mg/g creat - - - - -  Chol <200 mg/dL - 185 - 148 -  HDL > OR = 50 mg/dL - 75 - 57 -  Calc LDL mg/dL (calc) - 94 - 76 -  Triglycerides <150 mg/dL - 71 - 73 -  Creatinine 0.50 - 0.99 mg/dL 0.91 0.87 0.88 0.97 0.97   BP/Weight 11/03/2019 10/21/2019 09/18/2019 08/25/2019 05/21/2019 04/23/2019 4/92/0100  Systolic BP 712 197 588 325 498 264 158  Diastolic BP 72 77 78 70 74 70 78  Wt. (Lbs) 185 184.8 185.2 185 188.12 186.12 187.4  BMI 34.96 34.92 34.99 34.96 35.54 35.17 35.41   Foot/eye exam completion dates Latest Ref Rng & Units 08/25/2019 02/19/2018  Eye Exam No Retinopathy - -  Foot Form Completion - Done Done      Return in 3 monhts

## 2019-11-05 NOTE — Telephone Encounter (Signed)
Pt lvm that she was returning a call

## 2019-11-05 NOTE — Assessment & Plan Note (Signed)
Hyperlipidemia:Low fat diet discussed and encouraged.   Lipid Panel  Lab Results  Component Value Date   CHOL 185 07/22/2019   HDL 75 07/22/2019   LDLCALC 94 07/22/2019   TRIG 71 07/22/2019   CHOLHDL 2.5 07/22/2019

## 2019-11-05 NOTE — Telephone Encounter (Signed)
Pt aware.

## 2019-11-05 NOTE — Telephone Encounter (Signed)
I sent the message to [facility], I have tried komblglize as an alternative if that  does not work she will need to go back on metformin

## 2019-11-05 NOTE — Telephone Encounter (Signed)
Pt was wondering if Dr Moshe Cipro had figured out an alternative to metformin. She said Dr Moshe Cipro wanted her to stop Metformin but the janumet was too expensive. Please advise

## 2019-11-05 NOTE — Assessment & Plan Note (Signed)
Controlled, no change in medication DASH diet and commitment to daily physical activity for a minimum of 30 minutes discussed and encouraged, as a part of hypertension management. The importance of attaining a healthy weight is also discussed.  BP/Weight 11/03/2019 10/21/2019 09/18/2019 08/25/2019 05/21/2019 04/23/2019 09/10/6376  Systolic BP 588 502 774 128 786 767 209  Diastolic BP 72 77 78 70 74 70 78  Wt. (Lbs) 185 184.8 185.2 185 188.12 186.12 187.4  BMI 34.96 34.92 34.99 34.96 35.54 35.17 35.41

## 2019-11-05 NOTE — Telephone Encounter (Signed)
LVM letting pt know medicine had been sent to pharmacy

## 2019-11-05 NOTE — Progress Notes (Signed)
Michaela Peterson     MRN: [id number]      DOB: [birth year]   HPI Michaela Peterson is here for follow up and re-evaluation of chronic medical conditions, medication management and review of any available recent lab and radiology data.  Preventive health is updated, specifically  Cancer screening and Immunization.   Questions or concerns regarding consultations or procedures which the PT has had in the interim are  addressed. The PT denies any adverse reactions to current medications since the last visit.  Not testing blood sugar and not diligent with diet control   ROS Denies recent fever or chills. Denies sinus pressure, nasal congestion, ear pain or sore throat. Denies chest congestion, productive cough or wheezing. Denies chest pains, palpitations and leg swelling Denies abdominal pain, nausea, vomiting,diarrhea or constipation.   Denies dysuria, frequency, hesitancy or incontinence. C/o bilateral hand pain and joint swelling and stiffness, no direct trauma Denies headaches, seizures, numbness, or tingling. Denies depression, anxiety or insomnia. Denies skin break down or rash.   PE  BP 130/72   Pulse 76   Resp 16   Ht 5\' 1"  (1.549 m)   Wt 185 lb (83.9 kg)   LMP 08/18/2012   SpO2 98%   BMI 34.96 kg/m    Patient alert and oriented and in no cardiopulmonary distress.  HEENT: No facial asymmetry, EOMI,     Neck supple .  Chest: Clear to auscultation bilaterally.  CVS: S1, S2 no murmurs, no S3.Regular rate.  ABD: Soft non tender.   Ext: No edema  MS: Adequate ROM spine, shoulders, hips and knees.  Skin: Intact, no ulcerations or rash noted.  Psych: Good eye contact, normal affect. Memory intact not anxious or depressed appearing.  CNS: CN 2-12 intact, power,  normal throughout.no focal deficits noted.   Assessment & Plan  Hypertension goal BP (blood pressure) < 140/90 Controlled, no change in medication DASH diet and commitment to daily physical activity for a  minimum of 30 minutes discussed and encouraged, as a part of hypertension management. The importance of attaining a healthy weight is also discussed.  BP/Weight 11/03/2019 10/21/2019 09/18/2019 08/25/2019 05/21/2019 04/23/2019 05/17/6576  Systolic BP 469 629 528 413 244 010 272  Diastolic BP 72 77 78 70 74 70 78  Wt. (Lbs) 185 184.8 185.2 185 188.12 186.12 187.4  BMI 34.96 34.92 34.99 34.96 35.54 35.17 35.41       Bilateral hand pain Bilateral hand pain and finger joint swelling and stiffness, x ray of both likely arthritis  Type 2 diabetes mellitus with neurological complications (HCC) Deteriorated and uncontrolled, needs diabetic education and change metformin to combination med if possible Michaela Peterson is reminded of the importance of commitment to daily physical activity for 30 minutes or more, as able and the need to limit carbohydrate intake to 30 to 60 grams per meal to help with blood sugar control.   The need to take medication as prescribed, test blood sugar as directed, and to call between visits if there is a concern that blood sugar is uncontrolled is also discussed.   Michaela Peterson is reminded of the importance of daily foot exam, annual eye examination, and good blood sugar, blood pressure and cholesterol control.  Diabetic Labs Latest Ref Rng & Units 10/29/2019 07/22/2019 04/03/2019 12/10/2018 08/22/2018  HbA1c <5.7 % of total Hgb 8.9(H) 8.5(H) 7.8(H) 8.8(H) 9.9(H)  Microalbumin mg/dL - 16.6 - - -  Micro/Creat Ratio 0.0 - 30.0 mg/g creat - - - - -  Chol <200 mg/dL - 185 - 148 -  HDL > OR = 50 mg/dL - 75 - 57 -  Calc LDL mg/dL (calc) - 94 - 76 -  Triglycerides <150 mg/dL - 71 - 73 -  Creatinine 0.50 - 0.99 mg/dL 0.91 0.87 0.88 0.97 0.97   BP/Weight 11/03/2019 10/21/2019 09/18/2019 08/25/2019 05/21/2019 04/23/2019 4/49/7530  Systolic BP 051 102 111 735 670 141 030  Diastolic BP 72 77 78 70 74 70 78  Wt. (Lbs) 185 184.8 185.2 185 188.12 186.12 187.4  BMI 34.96 34.92 34.99 34.96 35.54 35.17  35.41   Foot/eye exam completion dates Latest Ref Rng & Units 08/25/2019 02/19/2018  Eye Exam No Retinopathy - -  Foot Form Completion - Done Done      Return in 3 monhts  Morbid obesity ([place])  Patient re-educated about  the importance of commitment to a  minimum of 150 minutes of exercise per week as able.  The importance of healthy food choices with portion control discussed, as well as eating regularly and within a 12 hour window most days. The need to choose "clean , green" food 50 to 75% of the time is discussed, as well as to make water the primary drink and set a goal of 64 ounces water daily.    Weight /BMI 11/03/2019 10/21/2019 09/18/2019  WEIGHT 185 lb 184 lb 12.8 oz 185 lb 3.2 oz  HEIGHT 5\' 1"  5\' 1"  5\' 1"   BMI 34.96 kg/m2 34.92 kg/m2 34.99 kg/m2      Hyperlipidemia LDL goal <100 Hyperlipidemia:Low fat diet discussed and encouraged.   Lipid Panel  Lab Results  Component Value Date   CHOL 185 07/22/2019   HDL 75 07/22/2019   LDLCALC 94 07/22/2019   TRIG 71 07/22/2019   CHOLHDL 2.5 07/22/2019

## 2019-11-09 ENCOUNTER — Telehealth: Payer: Self-pay

## 2019-11-09 ENCOUNTER — Telehealth: Payer: Medicare Other

## 2019-11-09 ENCOUNTER — Other Ambulatory Visit: Payer: Self-pay | Admitting: Family Medicine

## 2019-11-09 MED ORDER — SITAGLIP PHOS-METFORMIN HCL ER 50-1000 MG PO TB24: ORAL_TABLET | ORAL | 3 refills | Status: DC

## 2019-11-09 NOTE — Telephone Encounter (Signed)
Patient has spoken to her insurance company and they will cover [place]. She will like for you to prescribe this and send it in to the mail order pharmacy. [facility]. It will be affordable this way.

## 2019-11-09 NOTE — Telephone Encounter (Signed)
Left vm with instructions for patient to stop Metformin when she receives new prescription. Asked patient to call me at my direct line.

## 2019-11-09 NOTE — Telephone Encounter (Signed)
Patient wants Janumet sent in to [facility] because it will be cheaper.

## 2019-11-09 NOTE — Telephone Encounter (Signed)
[facility], when she gets this, STOP metformin, please remind her, thanks

## 2019-11-10 ENCOUNTER — Telehealth: Payer: Medicare Other | Admitting: Family Medicine

## 2019-11-10 NOTE — Telephone Encounter (Signed)
Pt spoke with nurse

## 2019-11-12 ENCOUNTER — Other Ambulatory Visit: Payer: Self-pay

## 2019-11-12 ENCOUNTER — Telehealth: Payer: Self-pay

## 2019-11-12 MED ORDER — AZELASTINE HCL 0.1 % NA SOLN: 2.0000 | Freq: Every day | NASAL | 3 refills | Status: DC

## 2019-11-12 NOTE — Progress Notes (Signed)
Sent to [facility]

## 2019-11-12 NOTE — Telephone Encounter (Signed)
Pt needs her nose spray changed to [facility] in [place] 

## 2019-11-13 DIAGNOSIS — G4733 Obstructive sleep apnea (adult) (pediatric): Secondary | ICD-10-CM | POA: Diagnosis not present

## 2019-11-25 ENCOUNTER — Telehealth: Payer: Self-pay

## 2019-11-25 NOTE — Telephone Encounter (Signed)
States she has been taking allopurinol 100 for gout prevention but for the past few days her big toe has been hurting and swollen and she wants to know if she can increase the allopurinol or need something else. Appt scheduled

## 2019-11-26 ENCOUNTER — Encounter: Payer: Self-pay | Admitting: Family Medicine

## 2019-11-26 ENCOUNTER — Ambulatory Visit (INDEPENDENT_AMBULATORY_CARE_PROVIDER_SITE_OTHER): Payer: Medicare Other | Admitting: Family Medicine

## 2019-11-26 ENCOUNTER — Other Ambulatory Visit: Payer: Self-pay

## 2019-11-26 VITALS — BP 134/69 | HR 68 | Resp 16 | Ht 61.0 in | Wt 181.0 lb

## 2019-11-26 DIAGNOSIS — I10 Essential (primary) hypertension: Secondary | ICD-10-CM | POA: Diagnosis not present

## 2019-11-26 DIAGNOSIS — E790 Hyperuricemia without signs of inflammatory arthritis and tophaceous disease: Secondary | ICD-10-CM | POA: Diagnosis not present

## 2019-11-26 DIAGNOSIS — M109 Gout, unspecified: Secondary | ICD-10-CM | POA: Insufficient documentation

## 2019-11-26 DIAGNOSIS — M79675 Pain in left toe(s): Secondary | ICD-10-CM

## 2019-11-26 MED ORDER — INDOMETHACIN 25 MG PO CAPS: ORAL_CAPSULE | ORAL | 0 refills | Status: DC

## 2019-11-26 MED ORDER — PREDNISONE 10 MG PO TABS: 10.0000 mg | ORAL_TABLET | Freq: Two times a day (BID) | ORAL | 0 refills | Status: DC

## 2019-11-26 MED ORDER — FAMOTIDINE 40 MG PO TABS: 40.0000 mg | ORAL_TABLET | Freq: Every day | ORAL | 0 refills | Status: DC

## 2019-11-26 NOTE — Patient Instructions (Signed)
F/U a s before, call if you need me sooner  Uric acid level today  Three medications are prescribed , please take as directed, you are being treated for acute gout of left great toe  .

## 2019-11-27 ENCOUNTER — Encounter: Payer: Self-pay | Admitting: Family Medicine

## 2019-11-27 DIAGNOSIS — E790 Hyperuricemia without signs of inflammatory arthritis and tophaceous disease: Secondary | ICD-10-CM | POA: Insufficient documentation

## 2019-11-27 LAB — URIC ACID: Uric Acid, Serum: 7.7 mg/dL — ABNORMAL HIGH (ref 2.5–7.0)

## 2019-11-27 MED ORDER — ALLOPURINOL 300 MG PO TABS: 300.0000 mg | ORAL_TABLET | Freq: Every day | ORAL | 2 refills | Status: DC

## 2019-11-27 NOTE — Assessment & Plan Note (Signed)
Currently experiencing acute gout flare, start allopurinol 12/02/2019

## 2019-11-27 NOTE — Progress Notes (Signed)
Acute Office Visit  Subjective:    Patient ID: Michaela Peterson, female    DOB: 02/15/1956, 64 y.o.   MRN: [id number]  Chief Complaint  Patient presents with  . Gout    left great toe. Woke up yesterday am with it hurting and swollen. Has been taking 137m of allopurinol and ibuprofen     HPI Patient is in today for acute left great toe pain, with swelling. No known trauma, and had j/o gout , has  been taking allopurinol and ibuprofen with no real benefit  Past Medical History:  Diagnosis Date  . Adnexal mass    Left; followed by Dr. FGlo Herring . Anemia, iron deficiency   . Arthritis   . At risk for cardiovascular event 06/17/2016  . Chronic low back pain   . Diabetes mellitus, type 2 ([place]   . Dyspnea 09/19/2018  . Essential hypertension   . Gastroparesis   . GERD (gastroesophageal reflux disease)   . MS (multiple sclerosis) ([facility]   . Nonalcoholic fatty liver disease   . Obesity   . Obesity, diabetes, and hypertension syndrome ([facility] 03/12/2009   Qualifier: Diagnosis of  By: SMoshe CiproMD, MJoycelyn Schmid   . Sleep apnea 2009  . Vaginitis 03/29/2018    Past Surgical History:  Procedure Laterality Date  . AGILE CAPSULE N/A 08/11/2014   Procedure: AGILE CAPSULE;  Surgeon: RDaneil Dolin MD;  [facility]: [facility];  Service: Endoscopy;  Laterality: N/A;  0700  . BLADDER SUSPENSION    . CESAREAN SECTION     x2   . CHOLECYSTECTOMY    . COLONOSCOPY  2010   Dr. RGala Romney tubular adenoma, few scattered diverticula  . COLONOSCOPY N/A 10/01/2013   Dr. RPixie Casinopreparation. Normal rectum. Normal colonic mucosa  . COLONOSCOPY N/A 11/12/2018   sigmoid and descending colon diverticulosis. Two 4-5 mm polyps in ascending colon. (tubular adenomas). Surveillance in 5 years  . DILATION AND CURETTAGE OF UTERUS     4  . ESOPHAGOGASTRODUODENOSCOPY  2010   Dr. RGala Romney normal esophagus, small hiatal hernia, questionable pale duodenal mucosa but negative for celiac sprue.   . ESOPHAGOGASTRODUODENOSCOPY  N/A 10/01/2013   Dr. Rourk:gastric polyps-status post biopsy. Otherwise, normal EGD. fundic gland polyp, negative H.pylori  . GIVENS CAPSULE STUDY N/A 09/08/2014   Poor prep but overall unremarkable   . KNEE ARTHROSCOPY W/ DEBRIDEMENT    . LAPAROSCOPY ABDOMEN DIAGNOSTIC     lysis of adhesions  . POLYPECTOMY  11/12/2018   Procedure: POLYPECTOMY;  Surgeon: RDaneil Dolin MD;  Location: [facility];  Service: Endoscopy;;  . ROTATOR CUFF REPAIR    . TENDON LENGTHENING     Left wrist  . UMBILICAL HERNIA REPAIR      Family History  Problem Relation Age of Onset  . Hypertension Sister   . Diabetes Sister   . Heart disease Sister   . Kidney failure Sister   . Heart failure Mother   . Hypertension Mother   . Arthritis Mother   . Stroke Father   . Hypertension Son   . Hypertension Sister   . Hypertension Brother   . Mental illness Brother   . Colon cancer Maternal Aunt     Social History   Socioeconomic History  . Marital status: Married    Spouse name: Michaela Peterson  . Number of children: 4  . Years of education: 159 . Highest education level: Not on file  Occupational History  . Occupation: unemployed  Employer: UNEMPLOYED  Tobacco Use  . Smoking status: Never Smoker  . Smokeless tobacco: Never Used  . Tobacco comment: patient lives with a smoker   Vaping Use  . Vaping Use: Never used  Substance and Sexual Activity  . Alcohol use: No  . Drug use: No  . Sexual activity: Yes    Birth control/protection: Post-menopausal  Other Topics Concern  . Not on file  Social History Narrative   Patient lives at home with husband Hedy Peterson.    Patient has 4 children.    Patient has a some college.    Patient is right handed.    Patient is currently not working.    Social Determinants of Health   Financial Resource Strain:   . Difficulty of Paying Living Expenses: Not on file  Food Insecurity:   . Worried About Charity fundraiser in the Last Year: Not on file  . Ran Out of Food in  the Last Year: Not on file  Transportation Needs:   . Lack of Transportation (Medical): Not on file  . Lack of Transportation (Non-Medical): Not on file  Physical Activity:   . Days of Exercise per Week: Not on file  . Minutes of Exercise per Session: Not on file  Stress:   . Feeling of Stress : Not on file  Social Connections:   . Frequency of Communication with Friends and Family: Not on file  . Frequency of Social Gatherings with Friends and Family: Not on file  . Attends Religious Services: Not on file  . Active Member of Clubs or Organizations: Not on file  . Attends Archivist Meetings: Not on file  . Marital Status: Not on file  Intimate Partner Violence:   . Fear of Current or Ex-Partner: Not on file  . Emotionally Abused: Not on file  . Physically Abused: Not on file  . Sexually Abused: Not on file    Outpatient Medications Prior to Visit  Medication Sig Dispense Refill  . aspirin EC 81 MG tablet Take 81 mg by mouth daily.    Marland Kitchen azelastine (ASTELIN) 0.1 % nasal spray Place 2 sprays into both nostrils at bedtime. Use in each nostril as directed 30 mL 3  . Blood Glucose Monitoring Suppl (ONE TOUCH ULTRA 2) w/Device KIT USE TO TEST ONCE DAILY 1 kit 0  . cetirizine (ZYRTEC) 10 MG tablet Take 10 mg by mouth daily.    . Cholecalciferol (DIALYVITE VITAMIN D 5000) 125 MCG (5000 UT) capsule Take 5,000 Units by mouth 3 (three) times a week.    . diphenhydrAMINE (BENADRYL) 25 MG tablet Take 25 mg by mouth every 6 (six) hours as needed for itching.    . fluticasone (FLONASE) 50 MCG/ACT nasal spray Place 2 sprays into both nostrils daily.     Marland Kitchen glipiZIDE (GLUCOTROL) 10 MG tablet TAKE 1 TABLET BY MOUTH  TWICE DAILY BEFORE MEALS 180 tablet 3  . montelukast (SINGULAIR) 10 MG tablet TAKE 1 TABLET BY MOUTH AT  BEDTIME 90 tablet 3  . NIFEdipine (ADALAT CC) 90 MG 24 hr tablet TAKE 1 TABLET BY MOUTH  DAILY 90 tablet 3  . Olopatadine HCl (PATADAY) 0.2 % SOLN Apply 1 drop to eye daily.     Glory Rosebush DELICA LANCETS 69G MISC USE UP TO 4 TIMES DAILY AS  DIRECTED 400 each 1  . ONETOUCH ULTRA test strip USE TO TEST ONCE DAILY 100 strip 3  . OVER THE COUNTER MEDICATION Apply 1 application topically daily  as needed (pain). horse liniment cream    . pantoprazole (PROTONIX) 40 MG tablet TAKE 1 TABLET BY MOUTH TWO  TIMES DAILY BEFORE MEALS (Patient taking differently: Take 40 mg by mouth daily. ) 180 tablet 3  . rosuvastatin (CRESTOR) 5 MG tablet Take one tablet by mouth every Monday and Friday 32 tablet 3  . SitaGLIPtin-MetFORMIN HCl 50-1000 MG TB24 Take two tablets by mouth once daily for blood sugar 180 tablet 3  . triamcinolone cream (KENALOG) 0.1 % Apply 1 application topically daily as needed (rash).    . triamterene-hydrochlorothiazide (MAXZIDE-25) 37.5-25 MG tablet TAKE 1 TABLET BY MOUTH  DAILY 90 tablet 3  . allopurinol (ZYLOPRIM) 100 MG tablet TAKE 1 TABLET BY MOUTH  DAILY 90 tablet 3  . ibuprofen (ADVIL) 200 MG tablet Take 400 mg by mouth every 6 (six) hours as needed for headache or moderate pain.     No facility-administered medications prior to visit.    Allergies  Allergen Reactions  . Gabapentin Shortness Of Breath  . Ace Inhibitors Cough  . Aspirin     stomach upset with high doses  . Oxycodone Hives  . Penicillins Hives    Did it involve swelling of the face/tongue/throat, SOB, or low BP? No Did it involve sudden or severe rash/hives, skin peeling, or any reaction on the inside of your mouth or nose? No Did you need to seek medical attention at a hospital or doctor's office? Unknown When did it last happen?Childhood allergy If all above answers are "NO", may proceed with cephalosporin use.    Marland Kitchen Rebif [Interferon Beta-1a] Other (See Comments)    Bloated and sick on the stomach  . Statins     NAFLD    Review of Systems See HPI Resp: denies cough, fever, sinus pressure, ear pain , sore throat , chills CVS: denies chest pain or palpitations or leg  swelling Denies polyuria, polydipsia, blurred vision , or hypoglycemic episodes. Reports FBG are between 140 and 150    Objective:    Physical Exam  BP 134/69   Pulse 68   Resp 16   Ht _0  (1.549 m)   Wt 181 lb (82.1 kg)   LMP 08/18/2012   SpO2 98%   BMI 34.20 kg/m  Wt Readings from Last 3 Encounters:  11/26/19 181 lb (82.1 kg)  11/03/19 185 lb (83.9 kg)  10/21/19 184 lb 12.8 oz (83.8 kg)    Health Maintenance Due  Topic Date Due  . OPHTHALMOLOGY EXAM  01/21/2019    There are no preventive care reminders to display for this patient.   Lab Results  Component Value Date   TSH 2.15 05/21/2019   Lab Results  Component Value Date   WBC 10.9 (H) 10/19/2019   HGB 12.0 10/19/2019   HCT 37.3 10/19/2019   MCV 83.4 10/19/2019   PLT 428 (H) 10/19/2019   Lab Results  Component Value Date   NA 141 10/29/2019   K 4.1 10/29/2019   CO2 27 10/29/2019   GLUCOSE 118 (H) 10/29/2019   BUN 15 10/29/2019   CREATININE 0.91 10/29/2019   BILITOT 0.4 07/22/2019   ALKPHOS 85 12/19/2016   AST 18 07/22/2019   ALT 21 07/22/2019   PROT 7.7 07/22/2019   ALBUMIN 4.3 12/19/2016   CALCIUM 9.8 10/29/2019   ANIONGAP 12 12/19/2016   Lab Results  Component Value Date   CHOL 185 07/22/2019   Lab Results  Component Value Date   HDL 75 07/22/2019  Lab Results  Component Value Date   LDLCALC 94 07/22/2019   Lab Results  Component Value Date   TRIG 71 07/22/2019   Lab Results  Component Value Date   CHOLHDL 2.5 07/22/2019   Lab Results  Component Value Date   HGBA1C 8.9 (H) 10/29/2019   Chest: CTA bilaterally CVS; S1, S2 no S3 no murmur MS: left great toe swollen , warm , tender , red    Assessment & Plan:  Hyperuricemia Currently experiencing acute gout flare, start allopurinol 12/02/2019  Great toe pain, left 2 day history.Acute gout flare, indomethacin and prednisone short term followed by daily allopurinol, short course pepcid also prescribed  Hypertension goal  BP (blood pressure) < 140/90 Controlled, no change in medication    Problem List Items Addressed This Visit      Other   Great toe pain, left    2 day history.Acute gout flare, indomethacin and prednisone short term followed by daily allopurinol, short course pepcid also prescribed      Relevant Orders   Uric acid   Uric acid (Completed)   Hyperuricemia    Currently experiencing acute gout flare, start allopurinol 12/02/2019          Meds ordered this encounter  Medications  . indomethacin (INDOCIN) 25 MG capsule    Sig: Take one capsule by mouth 3 times daily for 2 days, then take one capsule  by  Mouth 2 times daily for 3 days, then take one capsule  by mouth once daily for 3 days, then stop    Dispense:  15 capsule    Refill:  0  . predniSONE (DELTASONE) 10 MG tablet    Sig: Take 1 tablet (10 mg total) by mouth 2 (two) times daily with a meal.    Dispense:  10 tablet    Refill:  0  . famotidine (PEPCID) 40 MG tablet    Sig: Take 1 tablet (40 mg total) by mouth daily.    Dispense:  14 tablet    Refill:  0  . allopurinol (ZYLOPRIM) 300 MG tablet    Sig: Take 1 tablet (300 mg total) by mouth daily.    Dispense:  90 tablet    Refill:  2    Dose increase effective 12/02/2019, please dispense soo patient can start medication on  12/02/2019     Tula Nakayama, MD

## 2019-11-27 NOTE — Assessment & Plan Note (Signed)
Controlled, no change in medication  

## 2019-11-27 NOTE — Assessment & Plan Note (Signed)
2 day history.Acute gout flare, indomethacin and prednisone short term followed by daily allopurinol, short course pepcid also prescribed

## 2019-11-30 ENCOUNTER — Telehealth: Payer: Medicare Other

## 2019-12-14 ENCOUNTER — Ambulatory Visit (INDEPENDENT_AMBULATORY_CARE_PROVIDER_SITE_OTHER): Payer: Medicare Other | Admitting: Family Medicine

## 2019-12-14 ENCOUNTER — Encounter: Payer: Self-pay | Admitting: Family Medicine

## 2019-12-14 ENCOUNTER — Other Ambulatory Visit: Payer: Self-pay

## 2019-12-14 ENCOUNTER — Other Ambulatory Visit: Payer: Self-pay | Admitting: Family Medicine

## 2019-12-14 VITALS — BP 156/79 | HR 66 | Resp 16 | Ht 61.0 in | Wt 182.0 lb

## 2019-12-14 DIAGNOSIS — Z0000 Encounter for general adult medical examination without abnormal findings: Secondary | ICD-10-CM | POA: Diagnosis not present

## 2019-12-14 DIAGNOSIS — I10 Essential (primary) hypertension: Secondary | ICD-10-CM

## 2019-12-14 DIAGNOSIS — E1149 Type 2 diabetes mellitus with other diabetic neurological complication: Secondary | ICD-10-CM

## 2019-12-14 DIAGNOSIS — M79672 Pain in left foot: Secondary | ICD-10-CM

## 2019-12-14 DIAGNOSIS — E559 Vitamin D deficiency, unspecified: Secondary | ICD-10-CM

## 2019-12-14 MED ORDER — METHYLPREDNISOLONE ACETATE 80 MG/ML IJ SUSP
80.0000 mg | Freq: Once | INTRAMUSCULAR | Status: AC
  Administered 2019-12-14: 80 mg via INTRAMUSCULAR

## 2019-12-14 MED ORDER — SPIRONOLACTONE 25 MG PO TABS: 25.0000 mg | ORAL_TABLET | Freq: Every day | ORAL | 2 refills | Status: DC

## 2019-12-14 MED ORDER — PREDNISONE 20 MG PO TABS: ORAL_TABLET | ORAL | 0 refills | Status: DC

## 2019-12-14 MED ORDER — IBUPROFEN 800 MG PO TABS: ORAL_TABLET | ORAL | 0 refills | Status: DC

## 2019-12-14 NOTE — Progress Notes (Signed)
    Michaela Peterson     MRN: [id number]      DOB: 03-Sep-1955  HPI: Patient is in for annual physical exam. Left foot pain, redness and tenderness x 1 week, unable to weight bear without cane Recent labs, if available are reviewed. Immunization is reviewed , and  Is up to date   PE: BP (!) 156/79   Pulse 66   Resp 16   Ht 5\' 1"  (1.549 m)   Wt 182 lb 0.6 oz (82.6 kg)   LMP 08/18/2012   SpO2 99%   BMI 34.40 kg/m   Pleasant  female, alert and oriented x 3, in no cardio-pulmonary distress. Afebrile. HEENT No facial trauma or asymetry. Sinuses non tender.  Extra occullar muscles intact.. External ears normal, . Neck: supple, no adenopathy,JVD or thyromegaly.No bruits.  Chest: Clear to ascultation bilaterally.No crackles or wheezes. Non tender to palpation  Breast: Normal mammogram in 09/2019. No physical exam at visit. Asymptomatic.   Cardiovascular system; Heart sounds normal,  S1 and  S2 ,no S3.  No murmur, or thrill. Apical beat not displaced Peripheral pulses normal.  Abdomen: Soft, non tender, no organomegaly or masses. No bruits. Bowel sounds normal. No guarding, tenderness or rebound.   GU: Asymptomatic, pap in 20222 and pelvic   Musculoskeletal exam: Adequate  ROM of spine, hips , shoulders and knees. Ndeformity ,swelling and erythema noted of left ankle and foot. No muscle wasting or atrophy.   Neurologic: Cranial nerves 2 to 12 intact. Power, tone ,sensation and reflexes normal throughout. No disturbance in gait. No tremor.  Skin: Intact, no ulceration,  , scaling or rash noted. Pigmentation normal throughout  Psych; Normal mood and affect. Judgement and concentration normal   Assessment & Plan:  Annual physical exam Annual exam as documented. Counseling done  re healthy lifestyle involving commitment to 150 minutes exercise per week, heart healthy diet, and attaining healthy weight.The importance of adequate sleep also discussed. Regular  seat belt use and home safety, is also discussed. Changes in health habits are decided on by the patient with goals and time frames  set for achieving them. Immunization and cancer screening needs are specifically addressed at this visit.   Left foot pain Uncontrolled. depo medrol administered IM in the office , to be followed by a short course of oral prednisone and NSAIDS.   Hypertension goal BP (blood pressure) < 140/90 Uncontrolled and intolerant of maxzide, start spironolactone DASH diet and commitment to daily physical activity for a minimum of 30 minutes discussed and encouraged, as a part of hypertension management. The importance of attaining a healthy weight is also discussed.  BP/Weight 12/14/2019 11/26/2019 11/03/2019 10/21/2019 09/18/2019 08/25/2019 4/81/8563  Systolic BP 149 702 637 858 850 277 412  Diastolic BP 79 69 72 77 78 70 74  Wt. (Lbs) 182.04 181 185 184.8 185.2 185 188.12  BMI 34.4 34.2 34.96 34.92 34.99 34.96 35.54

## 2019-12-14 NOTE — Assessment & Plan Note (Signed)
Uncontrolled and intolerant of maxzide, start spironolactone DASH diet and commitment to daily physical activity for a minimum of 30 minutes discussed and encouraged, as a part of hypertension management. The importance of attaining a healthy weight is also discussed.  BP/Weight 12/14/2019 11/26/2019 11/03/2019 10/21/2019 09/18/2019 08/25/2019 09/30/5748  Systolic BP 518 335 825 189 842 103 128  Diastolic BP 79 69 72 77 78 70 74  Wt. (Lbs) 182.04 181 185 184.8 185.2 185 188.12  BMI 34.4 34.2 34.96 34.92 34.99 34.96 35.54

## 2019-12-14 NOTE — Assessment & Plan Note (Signed)

## 2019-12-14 NOTE — Patient Instructions (Addendum)
Follow-up in office with MD first week in December call if you need me sooner.  Non fasting chem 7 and EGFr, Vit D and hBA1C Nov 29 or shortly after  New medication for blood pressure control spironolactone 1 tablet daily continue nifedipine as before.  For acute left foot pain and swelling . Depo-Medrol 80 mg IM in the office today.  This is followed by a short course of anti-inflammatories.  If this does not resolve please contact me by the end of the week I will refer you directly to podiatry.  Please work on appropriate diet for the control of your diabetes as we discussed and keep appointment with nutrition educator.  Think about what you will eat, plan ahead. Choose " clean, green, fresh or frozen" over canned, processed or packaged foods which are more sugary, salty and fatty. 70 to 75% of food eaten should be vegetables and fruit. Three meals at set times with snacks allowed between meals, but they must be fruit or vegetables. Aim to eat over a 12 hour period , example 7 am to 7 pm, and STOP after  your last meal of the day. Drink water,generally about 64 ounces per day, no other drink is as healthy. Fruit juice is best enjoyed in a healthy way, by EATING the fruit. It is important that you exercise regularly at least 30 minutes 5 times a week. If you develop chest pain, have severe difficulty breathing, or feel very tired, stop exercising immediately and seek medical attention  Thanks for choosing [facility], we consider it a privelige to serve you.

## 2019-12-14 NOTE — Assessment & Plan Note (Addendum)
Uncontrolled. depo medrol administered IM in the office , to be followed by a short course of oral prednisone and NSAIDS.

## 2019-12-16 ENCOUNTER — Ambulatory Visit: Payer: Medicare Other | Admitting: Nutrition

## 2019-12-18 ENCOUNTER — Telehealth: Payer: Self-pay

## 2019-12-18 ENCOUNTER — Other Ambulatory Visit: Payer: Self-pay | Admitting: Family Medicine

## 2019-12-18 DIAGNOSIS — M79672 Pain in left foot: Secondary | ICD-10-CM

## 2019-12-18 DIAGNOSIS — M79675 Pain in left toe(s): Secondary | ICD-10-CM

## 2019-12-18 DIAGNOSIS — M25572 Pain in left ankle and joints of left foot: Secondary | ICD-10-CM

## 2019-12-18 NOTE — Telephone Encounter (Signed)
I have ordered X ray of left foot and ankle, and am referring he to Ortho for evaluation , Dr Aline Brochure / [facility]

## 2019-12-18 NOTE — Progress Notes (Signed)
Dg foot  

## 2019-12-18 NOTE — Telephone Encounter (Signed)
Spoke with pt and she stated that she is currently having some pain in her Lt pinky toe and ankle. The only way that she gets any relief is if she stays off of it and it hurts even if she touches it. She is currently out of her prednisone that was prescribed but still taking ibuprofen.  Wanted to F/U with you to see what the next steps are.  Please Advise.

## 2019-12-21 ENCOUNTER — Other Ambulatory Visit: Payer: Self-pay

## 2019-12-21 ENCOUNTER — Telehealth (INDEPENDENT_AMBULATORY_CARE_PROVIDER_SITE_OTHER): Payer: Medicare Other

## 2019-12-21 VITALS — BP 156/79 | Ht 61.0 in | Wt 182.0 lb

## 2019-12-21 DIAGNOSIS — Z0000 Encounter for general adult medical examination without abnormal findings: Secondary | ICD-10-CM | POA: Diagnosis not present

## 2019-12-21 NOTE — Progress Notes (Signed)
Subjective:   Michaela Peterson is a 64 y.o. female who presents for Medicare Annual (Subsequent) preventive examination.  Review of Systems     Cardiac Risk Factors include: diabetes mellitus;hypertension     Objective:    Today's Vitals   12/21/19 0823 12/21/19 0826 12/21/19 0845 12/21/19 0849  BP: (!) 156/79     Weight: 182 lb (82.6 kg)     Height: 5' 1"  (1.549 m)     PainSc: 5  5  5  5    PainLoc: Foot      Body mass index is 34.39 kg/m.  Advanced Directives 12/21/2019 05/06/2019 11/12/2018 11/01/2017 07/30/2017 12/19/2016 06/19/2016  Does Patient Have a Medical Advance Directive? No No No No No No No  Would patient like information on creating a medical advance directive? No - Patient declined Yes (MAU/Ambulatory/Procedural Areas - Information given) No - Patient declined Yes (ED - Information included in AVS) - No - Patient declined No - Patient declined  Pre-existing out of facility DNR order (yellow form or pink MOST form) - - - - - - -    Current Medications (verified) Outpatient Encounter Medications as of 12/21/2019  Medication Sig  . allopurinol (ZYLOPRIM) 300 MG tablet Take 1 tablet (300 mg total) by mouth daily.  Marland Kitchen aspirin EC 81 MG tablet Take 81 mg by mouth daily.  Marland Kitchen azelastine (ASTELIN) 0.1 % nasal spray Place 2 sprays into both nostrils at bedtime. Use in each nostril as directed  . Blood Glucose Monitoring Suppl (ONE TOUCH ULTRA 2) w/Device KIT USE TO TEST ONCE DAILY  . cetirizine (ZYRTEC) 10 MG tablet Take 10 mg by mouth daily.  . Cholecalciferol (DIALYVITE VITAMIN D 5000) 125 MCG (5000 UT) capsule Take 5,000 Units by mouth 3 (three) times a week.  . diphenhydrAMINE (BENADRYL) 25 MG tablet Take 25 mg by mouth every 6 (six) hours as needed for itching.  . famotidine (PEPCID) 40 MG tablet Take 1 tablet (40 mg total) by mouth daily.  . fluticasone (FLONASE) 50 MCG/ACT nasal spray Place 2 sprays into both nostrils daily.   Marland Kitchen glipiZIDE (GLUCOTROL) 10 MG tablet TAKE 1  TABLET BY MOUTH  TWICE DAILY BEFORE MEALS  . ibuprofen (ADVIL) 800 MG tablet Take one tablet by mouth once daily for 3 days , then as needed, for severe pain  . metFORMIN (GLUCOPHAGE) 1000 MG tablet Take 1,000 mg by mouth 2 (two) times daily with a meal.  . montelukast (SINGULAIR) 10 MG tablet TAKE 1 TABLET BY MOUTH AT  BEDTIME  . NIFEdipine (ADALAT CC) 90 MG 24 hr tablet TAKE 1 TABLET BY MOUTH  DAILY  . Olopatadine HCl (PATADAY) 0.2 % SOLN Apply 1 drop to eye daily.  Glory Rosebush DELICA LANCETS 14E MISC USE UP TO 4 TIMES DAILY AS  DIRECTED  . ONETOUCH ULTRA test strip USE TO TEST ONCE DAILY  . OVER THE COUNTER MEDICATION Apply 1 application topically daily as needed (pain). horse liniment cream  . pantoprazole (PROTONIX) 40 MG tablet TAKE 1 TABLET BY MOUTH TWO  TIMES DAILY BEFORE MEALS (Patient taking differently: Take 40 mg by mouth daily. )  . rosuvastatin (CRESTOR) 5 MG tablet Take one tablet by mouth every Monday and Friday  . spironolactone (ALDACTONE) 25 MG tablet Take 1 tablet (25 mg total) by mouth daily.  Marland Kitchen triamcinolone cream (KENALOG) 0.1 % Apply 1 application topically daily as needed (rash).  . [DISCONTINUED] predniSONE (DELTASONE) 20 MG tablet Take one tablet by mouth twice  daily for 3 days , then stop (Patient not taking: Reported on 12/21/2019)   No facility-administered encounter medications on file as of 12/21/2019.    Allergies (verified) Gabapentin, Ace inhibitors, Aspirin, Oxycodone, Penicillins, Rebif [interferon beta-1a], and Statins   History: Past Medical History:  Diagnosis Date  . Adnexal mass    Left; followed by Dr. [name]  . Anemia, iron deficiency   . Arthritis   . At risk for cardiovascular event 06/17/2016  . Chronic low back pain   . Diabetes mellitus, type 2 ([place])   . Dyspnea 09/19/2018  . Essential hypertension   . Gastroparesis   . GERD (gastroesophageal reflux disease)   . MS (multiple sclerosis) ([place])   . Nonalcoholic fatty liver disease   .  Obesity   . Obesity, diabetes, and hypertension syndrome ([place]) 03/12/2009   Qualifier: Diagnosis of  By: Moshe Cipro MD, Joycelyn Schmid    . Sleep apnea 2009  . Vaginitis 03/29/2018   Past Surgical History:  Procedure Laterality Date  . AGILE CAPSULE N/A 08/11/2014   Procedure: AGILE CAPSULE;  Surgeon: Daneil Dolin, MD;  [facility];  Service: Endoscopy;  Laterality: N/A;  0700  . BLADDER SUSPENSION    . CESAREAN SECTION     x2   . CHOLECYSTECTOMY    . COLONOSCOPY  2010   Dr. [name]: tubular adenoma, few scattered diverticula  . COLONOSCOPY N/A 10/01/2013   Dr. Pixie Casino preparation. Normal rectum. Normal colonic mucosa  . COLONOSCOPY N/A 11/12/2018   sigmoid and descending colon diverticulosis. Two 4-5 mm polyps in ascending colon. (tubular adenomas). Surveillance in 5 years  . DILATION AND CURETTAGE OF UTERUS     4  . ESOPHAGOGASTRODUODENOSCOPY  2010   Dr. [name]: normal esophagus, small hiatal hernia, questionable pale duodenal mucosa but negative for celiac sprue.   . ESOPHAGOGASTRODUODENOSCOPY N/A 10/01/2013   Dr. Rourk:gastric polyps-status post biopsy. Otherwise, normal EGD. fundic gland polyp, negative H.pylori  . GIVENS CAPSULE STUDY N/A 09/08/2014   Poor prep but overall unremarkable   . KNEE ARTHROSCOPY W/ DEBRIDEMENT    . LAPAROSCOPY ABDOMEN DIAGNOSTIC     lysis of adhesions  . POLYPECTOMY  11/12/2018   Procedure: POLYPECTOMY;  Surgeon: Daneil Dolin, MD;  [facility];  Service: Endoscopy;;  . ROTATOR CUFF REPAIR    . TENDON LENGTHENING     Left wrist  . UMBILICAL HERNIA REPAIR     Family History  Problem Relation Age of Onset  . Hypertension Sister   . Diabetes Sister   . Heart disease Sister   . Kidney failure Sister   . Heart failure Mother   . Hypertension Mother   . Arthritis Mother   . Stroke Father   . Hypertension Son   . Hypertension Sister   . Hypertension Brother   . Mental illness Brother   . Colon cancer Maternal Aunt    Social  History   Socioeconomic History  . Marital status: Married    Spouse name: [name]   . Number of children: 4  . Years of education: 28  . Highest education level: Not on file  Occupational History  . Occupation: unemployed     Fish farm manager: UNEMPLOYED  Tobacco Use  . Smoking status: Never Smoker  . Smokeless tobacco: Never Used  . Tobacco comment: patient lives with a smoker   Vaping Use  . Vaping Use: Never used  Substance and Sexual Activity  . Alcohol use: No  . Drug use: No  .  Sexual activity: Yes    Birth control/protection: Post-menopausal  Other Topics Concern  . Not on file  Social History Narrative   Patient lives at home with husband [name].    Patient has 4 children.    Patient has a some college.    Patient is right handed.    Patient is currently not working.    Social Determinants of Health   Financial Resource Strain: Low Risk   . Difficulty of Paying Living Expenses: Not very hard  Food Insecurity: No Food Insecurity  . Worried About Charity fundraiser in the Last Year: Never true  . Ran Out of Food in the Last Year: Never true  Transportation Needs: No Transportation Needs  . Lack of Transportation (Medical): No  . Lack of Transportation (Non-Medical): No  Physical Activity: Insufficiently Active  . Days of Exercise per Week: 1 day  . Minutes of Exercise per Session: 20 min  Stress: No Stress Concern Present  . Feeling of Stress : Only a little  Social Connections: Moderately Isolated  . Frequency of Communication with Friends and Family: More than three times a week  . Frequency of Social Gatherings with Friends and Family: More than three times a week  . Attends Religious Services: Never  . Active Member of Clubs or Organizations: No  . Attends Archivist Meetings: Never  . Marital Status: Married    Tobacco Counseling Counseling given: Not Answered Comment: patient lives with a smoker    Clinical Intake:  Pre-visit preparation  completed: Yes  Pain : 0-10 Pain Score: 5  Pain Type: Acute pain Pain Location: Foot Pain Orientation: Left Pain Descriptors / Indicators: Sharp, Shooting, Dull Pain Onset: 1 to 4 weeks ago Pain Frequency: Intermittent Pain Relieving Factors: rest Effect of Pain on Daily Activities: moderate  Pain Relieving Factors: rest  BMI - recorded: 34.41 Nutritional Status: BMI > 30  Obese Nutritional Risks: None Diabetes: Yes CBG done?: No Did pt. bring in CBG monitor from home?: No  How often do you need to have someone help you when you read instructions, pamphlets, or other written materials from your doctor or pharmacy?: 1 - Never What is the last grade level you completed in school?: 12  Diabetic?yes  Interpreter Needed?: No      Activities of Daily Living In your present state of health, do you have any difficulty performing the following activities: 12/21/2019  Hearing? N  Vision? N  Difficulty concentrating or making decisions? N  Walking or climbing stairs? N  Dressing or bathing? N  Doing errands, shopping? N  Preparing Food and eating ? N  Using the Toilet? N  In the past six months, have you accidently leaked urine? N  Do you have problems with loss of bowel control? N  Managing your Medications? N  Managing your Finances? N  Housekeeping or managing your Housekeeping? N  Some recent data might be hidden    Patient Care Team: [name], MD as PCP - General Rourk, Cristopher Estimable, MD as Consulting Physician (Gastroenterology) Dennie Bible, NP as Nurse Practitioner (Family Medicine) Elsie Saas, MD as Consulting Physician (Orthopedic Surgery) Satira Sark, MD as Consulting Physician (Cardiology)  Indicate any recent Medical Services you may have received from other than [facility] providers in the past year (date may be approximate).     Assessment:   This is a routine wellness examination for Fatuma.  Hearing/Vision screen No exam data  present  Dietary issues  and exercise activities discussed: Current Exercise Habits: Home exercise routine, Type of exercise: walking, Time (Minutes): 20, Frequency (Times/Week): 1, Weekly Exercise (Minutes/Week): 20, Intensity: Mild, Exercise limited by: Other - see comments  Goals    . Weight (lb) < 200 lb (90.7 kg)     Wants to lose 30 lbs.       Depression Screen PHQ 2/9 Scores 12/21/2019 12/21/2019 12/14/2019 11/03/2019 04/23/2019 11/05/2018 09/11/2018  PHQ - 2 Score 1 1 0 0 1 0 0  PHQ- 9 Score 3 3 - 2 6 - -    Fall Risk Fall Risk  12/21/2019 12/14/2019 11/26/2019 11/03/2019 08/25/2019  Falls in the past year? 0 1 0 0 0  Number falls in past yr: 0 1 0 0 0  Injury with Fall? 0 0 0 0 0  Follow up Falls evaluation completed - - - -    Any stairs in or around the home? Yes  If so, are there any without handrails? Yes  Home free of loose throw rugs in walkways, pet beds, electrical cords, etc? Yes  Adequate lighting in your home to reduce risk of falls? Yes   ASSISTIVE DEVICES UTILIZED TO PREVENT FALLS:  Life alert? No  Use of a cane, walker or w/c? No  Grab bars in the bathroom? Yes  Shower chair or bench in shower? Yes  Elevated toilet seat or a handicapped toilet? Yes   TIMED UP AND GO:  Was the test performed? No .  Length of time to ambulate 10 feet: NA sec.     Cognitive Function:     6CIT Screen 12/21/2019 11/05/2018 11/01/2017  What Year? 0 points 0 points 0 points  What month? 0 points 0 points 0 points  What time? 0 points 0 points 0 points  Count back from 20 0 points 0 points 0 points  Months in reverse 0 points 0 points 0 points  Repeat phrase 0 points 0 points 0 points  Total Score 0 0 0    Immunizations Immunization History  Administered Date(s) Administered  . Influenza Split 01/17/2011, 01/09/2012  . Influenza Whole 12/11/2006, 12/29/2008, 11/28/2009  . Influenza,inj,Quad PF,6+ Mos 01/08/2013, 12/23/2013, 01/18/2015, 12/14/2015, 12/19/2016,  12/20/2017, 12/17/2018, 11/03/2019  . Moderna SARS-COVID-2 Vaccination 05/20/2019, 06/17/2019  . Pneumococcal Conjugate-13 10/19/2013  . Pneumococcal Polysaccharide-23 11/30/2003, 08/30/2010  . Td 11/30/2003  . Tdap 12/23/2013  . Zoster 02/29/2016    Flu Vaccine status: Up to date Pneumococcal vaccine status: Up to date Covid-19 vaccine status: Completed vaccines  Qualifies for Shingles Vaccine? No   Zostavax completed Yes   Shingrix Completed?: Yes  Screening Tests Health Maintenance  Topic Date Due  . OPHTHALMOLOGY EXAM  01/21/2019  . HEMOGLOBIN A1C  04/30/2020  . URINE MICROALBUMIN  07/21/2020  . FOOT EXAM  08/24/2020  . PAP SMEAR-Modifier  02/19/2021  . MAMMOGRAM  09/30/2021  . TETANUS/TDAP  12/24/2023  . COLONOSCOPY  11/11/2028  . INFLUENZA VACCINE  Completed  . PNEUMOCOCCAL POLYSACCHARIDE VACCINE AGE 88-64 HIGH RISK  Completed  . COVID-19 Vaccine  Completed  . Hepatitis C Screening  Completed  . HIV Screening  Completed    Health Maintenance  Health Maintenance Due  Topic Date Due  . OPHTHALMOLOGY EXAM  01/21/2019    Colorectal cancer screening: Completed 11/12/18. Repeat every 10 years Mammogram status: Completed 10-01-19. Repeat every year   Lung Cancer Screening: (Low Dose CT Chest recommended if Age 30-80 years, 30 pack-year currently smoking OR have quit w/in 15years.) does not qualify.  Lung Cancer Screening Referral: No  Additional Screening:  Hepatitis C Screening: does not qualify; Completed   Vision Screening: Recommended annual ophthalmology exams for early detection of glaucoma and other disorders of the eye. Is the patient up to date with their annual eye exam?  No  Who is the provider or what is the name of the office in which the patient attends annual eye exams? Dr Jorja Loa [facility]  If pt is not established with a provider, would they like to be referred to a provider to establish care? No .   Dental Screening: Recommended annual dental  exams for proper oral hygiene  Community Resource Referral / Chronic Care Management: CRR required this visit?  No   CCM required this visit?  No      Plan:     I have personally reviewed and noted the following in the patient's chart:   . Medical and social history . Use of alcohol, tobacco or illicit drugs  . Current medications and supplements . Functional ability and status . Nutritional status . Physical activity . Advanced directives . List of other physicians . Hospitalizations, surgeries, and ER visits in previous 12 months . Vitals . Screenings to include cognitive, depression, and falls . Referrals and appointments  In addition, I have reviewed and discussed with patient certain preventive protocols, quality metrics, and best practice recommendations. A written personalized care plan for preventive services as well as general preventive health recommendations were provided to patient.     Ayah Cozzolino, [place]   [id number]/[id number]   Nurse Notes:

## 2019-12-22 ENCOUNTER — Ambulatory Visit (INDEPENDENT_AMBULATORY_CARE_PROVIDER_SITE_OTHER): Payer: Medicare Other | Admitting: Orthopaedic Surgery

## 2019-12-22 ENCOUNTER — Encounter: Payer: Self-pay | Admitting: Orthopaedic Surgery

## 2019-12-22 ENCOUNTER — Ambulatory Visit: Payer: Medicare Other

## 2019-12-22 ENCOUNTER — Other Ambulatory Visit: Payer: Self-pay

## 2019-12-22 VITALS — Ht 61.0 in | Wt 182.0 lb

## 2019-12-22 DIAGNOSIS — M79672 Pain in left foot: Secondary | ICD-10-CM | POA: Diagnosis not present

## 2019-12-22 NOTE — Progress Notes (Signed)
Subjective:    Patient ID: Michaela Peterson, female    DOB: 01/30/1956, 64 y.o.   MRN: [id number]  HPI She has had left foot pain on and off for a while.  She has gout and has been on allopurinol.  Recently she has had pain on the lateral side of the foot.  She has been seen at [facility] for this on 11-24-19.  I have reviewed the notes.  Her pain continues but she has some good days.  She has no trauma, no redness.  She has had some swelling.  She has tried Advil which helps.   Review of Systems  Constitutional: Positive for activity change.  Musculoskeletal: Positive for arthralgias, gait problem and joint swelling.  All other systems reviewed and are negative.  For Review of Systems, all other systems reviewed and are negative.  The following is a summary of the past history medically, past history surgically, known current medicines, social history and family history.  This information is gathered electronically by the computer from prior information and documentation.  I review this each visit and have found including this information at this point in the chart is beneficial and informative.   Past Medical History:  Diagnosis Date  . Adnexal mass    Left; followed by Dr. [name]  . Anemia, iron deficiency   . Arthritis   . At risk for cardiovascular event 06/17/2016  . Chronic low back pain   . Diabetes mellitus, type 2 ([place])   . Dyspnea 09/19/2018  . Essential hypertension   . Gastroparesis   . GERD (gastroesophageal reflux disease)   . MS (multiple sclerosis) ([place])   . Nonalcoholic fatty liver disease   . Obesity   . Obesity, diabetes, and hypertension syndrome ([place]) 03/12/2009   Qualifier: Diagnosis of  By: Moshe Cipro MD, Joycelyn Schmid    . Sleep apnea 2009  . Vaginitis 03/29/2018    Past Surgical History:  Procedure Laterality Date  . AGILE CAPSULE N/A 08/11/2014   Procedure: AGILE CAPSULE;  Surgeon: Daneil Dolin, MD;  [facility];  Service: Endoscopy;   Laterality: N/A;  0700  . BLADDER SUSPENSION    . CESAREAN SECTION     x2   . CHOLECYSTECTOMY    . COLONOSCOPY  2010   Dr. [name]: tubular adenoma, few scattered diverticula  . COLONOSCOPY N/A 10/01/2013   Dr. Pixie Casino preparation. Normal rectum. Normal colonic mucosa  . COLONOSCOPY N/A 11/12/2018   sigmoid and descending colon diverticulosis. Two 4-5 mm polyps in ascending colon. (tubular adenomas). Surveillance in 5 years  . DILATION AND CURETTAGE OF UTERUS     4  . ESOPHAGOGASTRODUODENOSCOPY  2010   Dr. [name]: normal esophagus, small hiatal hernia, questionable pale duodenal mucosa but negative for celiac sprue.   . ESOPHAGOGASTRODUODENOSCOPY N/A 10/01/2013   Dr. Rourk:gastric polyps-status post biopsy. Otherwise, normal EGD. fundic gland polyp, negative H.pylori  . GIVENS CAPSULE STUDY N/A 09/08/2014   Poor prep but overall unremarkable   . KNEE ARTHROSCOPY W/ DEBRIDEMENT    . LAPAROSCOPY ABDOMEN DIAGNOSTIC     lysis of adhesions  . POLYPECTOMY  11/12/2018   Procedure: POLYPECTOMY;  Surgeon: Daneil Dolin, MD;  [facility];  Service: Endoscopy;;  . ROTATOR CUFF REPAIR    . TENDON LENGTHENING     Left wrist  . UMBILICAL HERNIA REPAIR      Current Outpatient Medications on File Prior to Visit  Medication Sig Dispense Refill  . allopurinol (  ZYLOPRIM) 300 MG tablet Take 1 tablet (300 mg total) by mouth daily. 90 tablet 2  . aspirin EC 81 MG tablet Take 81 mg by mouth daily.    Marland Kitchen azelastine (ASTELIN) 0.1 % nasal spray Place 2 sprays into both nostrils at bedtime. Use in each nostril as directed 30 mL 3  . Blood Glucose Monitoring Suppl (ONE TOUCH ULTRA 2) w/Device KIT USE TO TEST ONCE DAILY 1 kit 0  . cetirizine (ZYRTEC) 10 MG tablet Take 10 mg by mouth daily.    . Cholecalciferol (DIALYVITE VITAMIN D 5000) 125 MCG (5000 UT) capsule Take 5,000 Units by mouth 3 (three) times a week.    . diphenhydrAMINE (BENADRYL) 25 MG tablet Take 25 mg by mouth every 6 (six) hours as  needed for itching.    . fluticasone (FLONASE) 50 MCG/ACT nasal spray Place 2 sprays into both nostrils daily.     Marland Kitchen glipiZIDE (GLUCOTROL) 10 MG tablet TAKE 1 TABLET BY MOUTH  TWICE DAILY BEFORE MEALS 180 tablet 3  . ibuprofen (ADVIL) 800 MG tablet Take one tablet by mouth once daily for 3 days , then as needed, for severe pain 20 tablet 0  . metFORMIN (GLUCOPHAGE) 1000 MG tablet Take 1,000 mg by mouth 2 (two) times daily with a meal.    . montelukast (SINGULAIR) 10 MG tablet TAKE 1 TABLET BY MOUTH AT  BEDTIME 90 tablet 3  . NIFEdipine (ADALAT CC) 90 MG 24 hr tablet TAKE 1 TABLET BY MOUTH  DAILY 90 tablet 3  . Olopatadine HCl (PATADAY) 0.2 % SOLN Apply 1 drop to eye daily.    Glory Rosebush DELICA LANCETS 88B MISC USE UP TO 4 TIMES DAILY AS  DIRECTED 400 each 1  . ONETOUCH ULTRA test strip USE TO TEST ONCE DAILY 100 strip 3  . OVER THE COUNTER MEDICATION Apply 1 application topically daily as needed (pain). horse liniment cream    . pantoprazole (PROTONIX) 40 MG tablet TAKE 1 TABLET BY MOUTH TWO  TIMES DAILY BEFORE MEALS (Patient taking differently: Take 40 mg by mouth daily. ) 180 tablet 3  . rosuvastatin (CRESTOR) 5 MG tablet Take one tablet by mouth every Monday and Friday 32 tablet 3  . spironolactone (ALDACTONE) 25 MG tablet Take 1 tablet (25 mg total) by mouth daily. 30 tablet 2  . triamcinolone cream (KENALOG) 0.1 % Apply 1 application topically daily as needed (rash).     No current facility-administered medications on file prior to visit.    Social History   Socioeconomic History  . Marital status: Married    Spouse name: [name]   . Number of children: 4  . Years of education: 41  . Highest education level: Not on file  Occupational History  . Occupation: unemployed     Fish farm manager: UNEMPLOYED  Tobacco Use  . Smoking status: Never Smoker  . Smokeless tobacco: Never Used  . Tobacco comment: patient lives with a smoker   Vaping Use  . Vaping Use: Never used  Substance and Sexual  Activity  . Alcohol use: No  . Drug use: No  . Sexual activity: Yes    Birth control/protection: Post-menopausal  Other Topics Concern  . Not on file  Social History Narrative   Patient lives at home with husband [name].    Patient has 4 children.    Patient has a some college.    Patient is right handed.    Patient is currently not working.    Social Determinants of  Health   Financial Resource Strain: Low Risk   . Difficulty of Paying Living Expenses: Not very hard  Food Insecurity: No Food Insecurity  . Worried About Charity fundraiser in the Last Year: Never true  . Ran Out of Food in the Last Year: Never true  Transportation Needs: No Transportation Needs  . Lack of Transportation (Medical): No  . Lack of Transportation (Non-Medical): No  Physical Activity: Insufficiently Active  . Days of Exercise per Week: 1 day  . Minutes of Exercise per Session: 20 min  Stress: No Stress Concern Present  . Feeling of Stress : Only a little  Social Connections: Moderately Isolated  . Frequency of Communication with Friends and Family: More than three times a week  . Frequency of Social Gatherings with Friends and Family: More than three times a week  . Attends Religious Services: Never  . Active Member of Clubs or Organizations: No  . Attends Archivist Meetings: Never  . Marital Status: Married  Human resources officer Violence: Not At Risk  . Fear of Current or Ex-Partner: No  . Emotionally Abused: No  . Physically Abused: No  . Sexually Abused: No    Family History  Problem Relation Age of Onset  . Hypertension Sister   . Diabetes Sister   . Heart disease Sister   . Kidney failure Sister   . Heart failure Mother   . Hypertension Mother   . Arthritis Mother   . Stroke Father   . Hypertension Son   . Hypertension Sister   . Hypertension Brother   . Mental illness Brother   . Colon cancer Maternal Aunt     Ht 5' 1"  (1.549 m)   Wt 182 lb (82.6 kg)   LMP 08/18/2012    BMI 34.39 kg/m   Body mass index is 34.39 kg/m.      Objective:   Physical Exam Vitals and nursing note reviewed. Exam conducted with a chaperone present.  Constitutional:      Appearance: She is well-developed.  HENT:     Head: Normocephalic and atraumatic.  Eyes:     Conjunctiva/sclera: Conjunctivae normal.     Pupils: Pupils are equal, round, and reactive to light.  Cardiovascular:     Rate and Rhythm: Normal rate and regular rhythm.  Pulmonary:     Effort: Pulmonary effort is normal.  Abdominal:     Palpations: Abdomen is soft.  Musculoskeletal:     Cervical back: Normal range of motion and neck supple.       Feet:  Skin:    General: Skin is warm and dry.  Neurological:     Mental Status: She is alert and oriented to person, place, and time.     Cranial Nerves: No cranial nerve deficit.     Motor: No abnormal muscle tone.     Coordination: Coordination normal.     Deep Tendon Reflexes: Reflexes are normal and symmetric. Reflexes normal.  Psychiatric:        Behavior: Behavior normal.        Thought Content: Thought content normal.        Judgment: Judgment normal.    X-rays were done of the left foot, reported separately.       Assessment & Plan:   Encounter Diagnosis  Name Primary?  . Pain in left foot Yes   X-rays were negative.  I have told her about using Aspercreme, BioFreeze or Voltaren Gel.   I have told her  about soaking the foot.  I have offered a CAM walker to use.  Return in two weeks.  Continue the allopurinol.  Call if any problem.  Precautions discussed.   Electronically Signed Sanjuana Kava, MD 10/12/20211:55 PM

## 2019-12-25 ENCOUNTER — Encounter: Payer: Self-pay | Admitting: Dietician

## 2019-12-25 ENCOUNTER — Other Ambulatory Visit: Payer: Self-pay

## 2019-12-25 ENCOUNTER — Encounter: Payer: Medicare Other | Attending: Family Medicine | Admitting: Dietician

## 2019-12-25 VITALS — Ht 61.0 in | Wt 180.6 lb

## 2019-12-25 DIAGNOSIS — E785 Hyperlipidemia, unspecified: Secondary | ICD-10-CM | POA: Diagnosis not present

## 2019-12-25 DIAGNOSIS — E1169 Type 2 diabetes mellitus with other specified complication: Secondary | ICD-10-CM | POA: Diagnosis not present

## 2019-12-25 DIAGNOSIS — I10 Essential (primary) hypertension: Secondary | ICD-10-CM | POA: Diagnosis not present

## 2019-12-25 NOTE — Patient Instructions (Addendum)
Work towards drinking 64 oz/2 Liters of water a day. In the next week, try to get 32 oz during the day.  Stop drinking water at dinner time to avoid having to go to the bathroom at night.  Eat your vegetables soft cooked to avoid GI discomfort.  Try a banana with peanut butter or cheese for breakfast.  Try roasted [place] or chicken for deli meats instead of ham for sandwiches.   Move your sweet treat to one of your meals, and dont snack between meals.  Make small goals for each of the points on the Diabetes instruction sheet, and we will set new ones next appointment.

## 2019-12-25 NOTE — Progress Notes (Signed)
Medical Nutrition Therapy:  Appt start time: 0800 end time:  0900.   Assessment:  Primary concerns today: Diabetes.   Pt has been type 2 diabetic since the late 1990s. Reports a highest A1c of over 10. Pt got her A1c down to 7.8 by "eating all the things she doesn't like to eat", and then it went back up because she couldn't continue her dietary changes.  Pt has experienced both hyper and hypoglycemia in the past, and recognizes their symptoms. Pt able to recall rule of 15 for hypoglycemia, and states she has used it with success. Pt reports checking her blood sugar almost every day. Sometimes she gets in a rush in the morning and doesn't check. FBG - usually 179-200 Pt reports having gout, and was recently on prednisone. States that she is checking her feet regularly. Pt reports being a "sweet-o-holic".  Reports breakfast is hard for her to eat consistently. Pt can recall how to count carbs, but does not adhere to it. Pt reports not drinking water, sometimes lemon water or kool-aid/tang zero. Pt reports frequent urination at night, makes sleeping difficult.   Preferred Learning Style:   No preference indicated   Learning Readiness:   Contemplating    MEDICATIONS: Glipizide, metFormin, Protonix, Rosuvastatin, Zyloprim   DIETARY INTAKE:  Usual eating pattern includes 2 meals and 2 snacks per day.  Everyday foods include Potato chips, sweets.     24-hr recall:  B ( AM): none Snk ( AM): none  L ( 11 AM): [employer] cheeseburger, small fries, half and half sweet tea Snk ( PM): Potato Chips D ( PM): Pot roast, white potatoes, carrots, onions, potato chips, 2 20 oz. diet gingerale Snk ( PM): none Beverages: sweet tea  Usual physical activity: ADLs  Estimated energy needs: 1600 calories 180 g carbohydrates 120 g protein 44 g fat  Progress Towards Goal(s):  In progress.   Nutritional Diagnosis:  NB-1.1 Food and nutrition-related knowledge deficit As related to long term  DM.  As evidenced by A1c of 8.9, pt history of DM over 20 years, and 24 hr recall.    Intervention:  Nutrition Education.  Educated patient on the pathophysiology of diabetes and cardiovascular health. This includes why our bodies need circulating blood sugar, the relationship between insulin and blood sugar, and the results of insulin resistance and/or pancreatic insufficiency on the development of diabetes. Educated patient on factors that contribute to elevation of blood sugars, such as stress, illness, injury,and food choices. Discussed the role that physical activity plays in lowering blood sugar.  Discussed the importance of eating a consistent amount of carbohydrate, and a balance of carbohydrate and protein. throughout the day. Reviewed with patient on carbohydrate counting, 15g of carbohydrate equals one carb choice. Advised patient on the importance of consistently checking their blood sugar, and recognizing how lifestyle and food choices affect those numbers. Educate patient on the importance of soft cooked vegetables in minimize symptoms of diverticulosis.  Goals:  Work towards drinking 64 oz/2 Liters of water a day.  In the next week, try to get 32 oz during the day.   Stop drinking water at dinner time to avoid having to go to the bathroom at night.  Eat your vegetables soft cooked to avoid GI discomfort.  Try a banana with peanut butter or cheese for breakfast.  Try roasted [place] or chicken for deli meats instead of ham for sandwiches.   Move your sweet treat to one of your meals, and dont snack between  meals.  Make small goals for each of the points on the Diabetes instruction sheet, and we will set new ones next appointment.  Teaching Method Utilized:  Visual Auditory   Handouts given during visit include:  Diabetes Instructions  MyPlate balanced plate  Barriers to learning/adherence to lifestyle change: None  Demonstrated degree of understanding via:  Teach  Back   Monitoring/Evaluation:  Dietary intake, exercise, meal pattern, and body weight in 1 month(s).

## 2019-12-31 ENCOUNTER — Telehealth: Payer: Self-pay

## 2019-12-31 ENCOUNTER — Other Ambulatory Visit: Payer: Self-pay

## 2019-12-31 ENCOUNTER — Other Ambulatory Visit: Payer: Self-pay | Admitting: Family Medicine

## 2019-12-31 MED ORDER — METFORMIN HCL 1000 MG PO TABS: 1000.0000 mg | ORAL_TABLET | Freq: Two times a day (BID) | ORAL | 3 refills | Status: DC

## 2019-12-31 MED ORDER — SPIRONOLACTONE 25 MG PO TABS
25.0000 mg | ORAL_TABLET | Freq: Every day | ORAL | 2 refills | Status: DC
Start: 2019-12-31 — End: 2020-02-18

## 2019-12-31 NOTE — Telephone Encounter (Signed)
Patient called stating she can not afford Janumet. Wants to go back on Metformin. Needs a new prescription sent in and an emergency supply sent in because she is completely out of pills. Also lost her new bp med. Wants to know if you can send her in an emergency supply. Went to dietician and now cant find it.

## 2019-12-31 NOTE — Telephone Encounter (Signed)
Emergency and mail order supplies sent for metformin. Not clear as to if she does need th spironolactone sent in , did she find it? If she needs it  please send same dose to pharmacy she requests, thanks

## 2019-12-31 NOTE — Progress Notes (Signed)
Metformin 1000

## 2019-12-31 NOTE — Telephone Encounter (Signed)
Patient aware that meds have been sent in

## 2020-01-05 ENCOUNTER — Other Ambulatory Visit: Payer: Self-pay

## 2020-01-05 ENCOUNTER — Encounter: Payer: Self-pay | Admitting: Orthopaedic Surgery

## 2020-01-05 ENCOUNTER — Ambulatory Visit: Payer: Medicare Other | Admitting: Orthopaedic Surgery

## 2020-01-05 VITALS — BP 168/85 | HR 78 | Ht 61.0 in | Wt 180.0 lb

## 2020-01-05 DIAGNOSIS — M1A0720 Idiopathic chronic gout, left ankle and foot, without tophus (tophi): Secondary | ICD-10-CM | POA: Diagnosis not present

## 2020-01-05 DIAGNOSIS — M79672 Pain in left foot: Secondary | ICD-10-CM | POA: Diagnosis not present

## 2020-01-05 NOTE — Progress Notes (Signed)
Patient TU:UEKCM Michaela Peterson, female DOB:April 23, 1955, 64 y.o. KLK:[id number]  Chief Complaint  Patient presents with  . Foot Pain    left foot pain f/u    HPI  Michaela Peterson is a 64 y.o. female who continues to have left lateral foot pain.  She has no new trauma.  She has less pain of the great toe and she is taking the allopurinol for this.  That has helped but her lateral pain is worse.  She has swelling laterally. I will get MRI of the left foot.   Body mass index is 34.01 kg/m.  ROS  Review of Systems  Constitutional: Positive for activity change.  Musculoskeletal: Positive for arthralgias, gait problem and joint swelling.  All other systems reviewed and are negative.   All other systems reviewed and are negative.  The following is a summary of the past history medically, past history surgically, known current medicines, social history and family history.  This information is gathered electronically by the computer from prior information and documentation.  I review this each visit and have found including this information at this point in the chart is beneficial and informative.    Past Medical History:  Diagnosis Date  . Adnexal mass    Left; followed by Dr. [name]  . Anemia, iron deficiency   . Arthritis   . At risk for cardiovascular event 06/17/2016  . Chronic low back pain   . Diabetes mellitus, type 2 ([place])   . Dyspnea 09/19/2018  . Essential hypertension   . Gastroparesis   . GERD (gastroesophageal reflux disease)   . MS (multiple sclerosis) ([place])   . Nonalcoholic fatty liver disease   . Obesity   . Obesity, diabetes, and hypertension syndrome ([place]) 03/12/2009   Qualifier: Diagnosis of  By: Moshe Cipro MD, Joycelyn Schmid    . Sleep apnea 2009  . Vaginitis 03/29/2018    Past Surgical History:  Procedure Laterality Date  . AGILE CAPSULE N/A 08/11/2014   Procedure: AGILE CAPSULE;  Surgeon: Daneil Dolin, MD;  Location: [facility];  Service: Endoscopy;  Laterality: N/A;   0700  . BLADDER SUSPENSION    . CESAREAN SECTION     x2   . CHOLECYSTECTOMY    . COLONOSCOPY  2010   Dr. [name]: tubular adenoma, few scattered diverticula  . COLONOSCOPY N/A 10/01/2013   Dr. Pixie Casino preparation. Normal rectum. Normal colonic mucosa  . COLONOSCOPY N/A 11/12/2018   sigmoid and descending colon diverticulosis. Two 4-5 mm polyps in ascending colon. (tubular adenomas). Surveillance in 5 years  . DILATION AND CURETTAGE OF UTERUS     4  . ESOPHAGOGASTRODUODENOSCOPY  2010   Dr. [name]: normal esophagus, small hiatal hernia, questionable pale duodenal mucosa but negative for celiac sprue.   . ESOPHAGOGASTRODUODENOSCOPY N/A 10/01/2013   Dr. Rourk:gastric polyps-status post biopsy. Otherwise, normal EGD. fundic gland polyp, negative H.pylori  . GIVENS CAPSULE STUDY N/A 09/08/2014   Poor prep but overall unremarkable   . KNEE ARTHROSCOPY W/ DEBRIDEMENT    . LAPAROSCOPY ABDOMEN DIAGNOSTIC     lysis of adhesions  . POLYPECTOMY  11/12/2018   Procedure: POLYPECTOMY;  Surgeon: Daneil Dolin, MD;  Location: [facility];  Service: Endoscopy;;  . ROTATOR CUFF REPAIR    . TENDON LENGTHENING     Left wrist  . UMBILICAL HERNIA REPAIR      Family History  Problem Relation Age of Onset  . Hypertension Sister   . Diabetes Sister   . Heart disease  Sister   . Kidney failure Sister   . Heart failure Mother   . Hypertension Mother   . Arthritis Mother   . Stroke Father   . Hypertension Son   . Hypertension Sister   . Hypertension Brother   . Mental illness Brother   . Colon cancer Maternal Aunt     Social History Social History   Tobacco Use  . Smoking status: Never Smoker  . Smokeless tobacco: Never Used  . Tobacco comment: patient lives with a smoker   Vaping Use  . Vaping Use: Never used  Substance Use Topics  . Alcohol use: No  . Drug use: No    Allergies  Allergen Reactions  . Gabapentin Shortness Of Breath  . Ace Inhibitors Cough  . Aspirin     stomach  upset with high doses  . Oxycodone Hives  . Penicillins Hives    Did it involve swelling of the face/tongue/throat, SOB, or low BP? No Did it involve sudden or severe rash/hives, skin peeling, or any reaction on the inside of your mouth or nose? No Did you need to seek medical attention at a hospital or doctor's office? Unknown When did it last happen?Childhood allergy If all above answers are "NO", may proceed with cephalosporin use.    Marland Kitchen Rebif [Interferon Beta-1a] Other (See Comments)    Bloated and sick on the stomach  . Statins     NAFLD    Current Outpatient Medications  Medication Sig Dispense Refill  . allopurinol (ZYLOPRIM) 300 MG tablet Take 1 tablet (300 mg total) by mouth daily. 90 tablet 2  . aspirin EC 81 MG tablet Take 81 mg by mouth daily.    Marland Kitchen azelastine (ASTELIN) 0.1 % nasal spray Place 2 sprays into both nostrils at bedtime. Use in each nostril as directed 30 mL 3  . Blood Glucose Monitoring Suppl (ONE TOUCH ULTRA 2) w/Device KIT USE TO TEST ONCE DAILY 1 kit 0  . cetirizine (ZYRTEC) 10 MG tablet Take 10 mg by mouth daily.    . Cholecalciferol (DIALYVITE VITAMIN D 5000) 125 MCG (5000 UT) capsule Take 5,000 Units by mouth 3 (three) times a week.    . diphenhydrAMINE (BENADRYL) 25 MG tablet Take 25 mg by mouth every 6 (six) hours as needed for itching.    . fluticasone (FLONASE) 50 MCG/ACT nasal spray Place 2 sprays into both nostrils daily.     Marland Kitchen glipiZIDE (GLUCOTROL) 10 MG tablet TAKE 1 TABLET BY MOUTH  TWICE DAILY BEFORE MEALS 180 tablet 3  . ibuprofen (ADVIL) 800 MG tablet Take one tablet by mouth once daily for 3 days , then as needed, for severe pain 20 tablet 0  . metFORMIN (GLUCOPHAGE) 1000 MG tablet Take 1,000 mg by mouth 2 (two) times daily with a meal.    . metFORMIN (GLUCOPHAGE) 1000 MG tablet Take 1 tablet (1,000 mg total) by mouth 2 (two) times daily with a meal. 60 tablet 3  . metFORMIN (GLUCOPHAGE) 1000 MG tablet Take 1 tablet (1,000 mg total) by  mouth 2 (two) times daily with a meal. 180 tablet 3  . montelukast (SINGULAIR) 10 MG tablet TAKE 1 TABLET BY MOUTH AT  BEDTIME 90 tablet 3  . NIFEdipine (ADALAT CC) 90 MG 24 hr tablet TAKE 1 TABLET BY MOUTH  DAILY 90 tablet 3  . Olopatadine HCl (PATADAY) 0.2 % SOLN Apply 1 drop to eye daily.    Glory Rosebush DELICA LANCETS 91Y MISC USE UP TO 4 TIMES  DAILY AS  DIRECTED 400 each 1  . ONETOUCH ULTRA test strip USE TO TEST ONCE DAILY 100 strip 3  . OVER THE COUNTER MEDICATION Apply 1 application topically daily as needed (pain). horse liniment cream    . pantoprazole (PROTONIX) 40 MG tablet TAKE 1 TABLET BY MOUTH TWO  TIMES DAILY BEFORE MEALS (Patient taking differently: Take 40 mg by mouth daily. ) 180 tablet 3  . rosuvastatin (CRESTOR) 5 MG tablet Take one tablet by mouth every Monday and Friday 32 tablet 3  . spironolactone (ALDACTONE) 25 MG tablet Take 1 tablet (25 mg total) by mouth daily. 30 tablet 2  . triamcinolone cream (KENALOG) 0.1 % Apply 1 application topically daily as needed (rash).     No current facility-administered medications for this visit.     Physical Exam  Blood pressure (!) 168/85, pulse 78, height 5' 1"  (1.549 m), weight 180 lb (81.6 kg), last menstrual period 08/18/2012.  Constitutional: overall normal hygiene, normal nutrition, well developed, normal grooming, normal body habitus. Assistive device:none  Musculoskeletal: gait and station Limp left, muscle tone and strength are normal, no tremors or atrophy is present.  .  Neurological: coordination overall normal.  Deep tendon reflex/nerve stretch intact.  Sensation normal.  Cranial nerves II-XII intact.   Skin:   Normal overall no scars, lesions, ulcers or rashes. No psoriasis.  Psychiatric: Alert and oriented x 3.  Recent memory intact, remote memory unclear.  Normal mood and affect. Well groomed.  Good eye contact.  Cardiovascular: overall no swelling, no varicosities, no edema bilaterally, normal temperatures of  the legs and arms, no clubbing, cyanosis and good capillary refill.  Lymphatic: palpation is normal.  Left lateral foot is tender and has some swelling, there is no redness.  NV intact. ROM is full.  Limp left.   All other systems reviewed and are negative   The patient has been educated about the nature of the problem(s) and counseled on treatment options.  The patient appeared to understand what I have discussed and is in agreement with it.  Encounter Diagnoses  Name Primary?  . Pain in left foot Yes  . Idiopathic chronic gout of left foot without tophus     PLAN Call if any problems.  Precautions discussed.  Continue current medications.   Return to clinic 2 weeks   Get MRI of the left foot.  Electronically Signed Sanjuana Kava, MD 10/26/202110:49 AM

## 2020-01-19 ENCOUNTER — Ambulatory Visit: Payer: Medicare Other | Admitting: Orthopaedic Surgery

## 2020-01-21 ENCOUNTER — Ambulatory Visit: Payer: Medicare Other | Admitting: Gastroenterology

## 2020-01-26 ENCOUNTER — Telehealth: Payer: Self-pay | Admitting: Orthopaedic Surgery

## 2020-01-26 NOTE — Telephone Encounter (Signed)
Call (voice message) received from patient /call returned - reached patient's voice mail, left message.

## 2020-01-28 ENCOUNTER — Ambulatory Visit (HOSPITAL_COMMUNITY): Payer: Medicare Other

## 2020-01-29 ENCOUNTER — Ambulatory Visit: Payer: Medicare Other | Admitting: Dietician

## 2020-02-01 DIAGNOSIS — M7752 Other enthesopathy of left foot: Secondary | ICD-10-CM | POA: Diagnosis not present

## 2020-02-01 DIAGNOSIS — M79672 Pain in left foot: Secondary | ICD-10-CM | POA: Diagnosis not present

## 2020-02-08 DIAGNOSIS — L110 Acquired keratosis follicularis: Secondary | ICD-10-CM | POA: Diagnosis not present

## 2020-02-08 DIAGNOSIS — E1140 Type 2 diabetes mellitus with diabetic neuropathy, unspecified: Secondary | ICD-10-CM | POA: Diagnosis not present

## 2020-02-08 DIAGNOSIS — M79671 Pain in right foot: Secondary | ICD-10-CM | POA: Diagnosis not present

## 2020-02-08 DIAGNOSIS — M79672 Pain in left foot: Secondary | ICD-10-CM | POA: Diagnosis not present

## 2020-02-08 DIAGNOSIS — I739 Peripheral vascular disease, unspecified: Secondary | ICD-10-CM | POA: Diagnosis not present

## 2020-02-12 DIAGNOSIS — M19072 Primary osteoarthritis, left ankle and foot: Secondary | ICD-10-CM | POA: Diagnosis not present

## 2020-02-12 DIAGNOSIS — R600 Localized edema: Secondary | ICD-10-CM | POA: Diagnosis not present

## 2020-02-12 DIAGNOSIS — M7752 Other enthesopathy of left foot: Secondary | ICD-10-CM | POA: Diagnosis not present

## 2020-02-16 DIAGNOSIS — I10 Essential (primary) hypertension: Secondary | ICD-10-CM | POA: Diagnosis not present

## 2020-02-16 DIAGNOSIS — E559 Vitamin D deficiency, unspecified: Secondary | ICD-10-CM | POA: Diagnosis not present

## 2020-02-16 DIAGNOSIS — E1149 Type 2 diabetes mellitus with other diabetic neurological complication: Secondary | ICD-10-CM | POA: Diagnosis not present

## 2020-02-17 LAB — BMP8+EGFR
BUN/Creatinine Ratio: 18 (ref 12–28)
BUN: 20 mg/dL (ref 8–27)
CO2: 19 mmol/L — ABNORMAL LOW (ref 20–29)
Calcium: 10 mg/dL (ref 8.7–10.3)
Chloride: 104 mmol/L (ref 96–106)
Creatinine, Ser: 1.13 mg/dL — ABNORMAL HIGH (ref 0.57–1.00)
GFR calc Af Amer: 59 mL/min/{1.73_m2} — ABNORMAL LOW (ref >59)
GFR calc non Af Amer: 51 mL/min/{1.73_m2} — ABNORMAL LOW (ref >59)
Glucose: 145 mg/dL — ABNORMAL HIGH (ref 65–99)
Potassium: 4.3 mmol/L (ref 3.5–5.2)
Sodium: 140 mmol/L (ref 134–144)

## 2020-02-17 LAB — VITAMIN D 25 HYDROXY (VIT D DEFICIENCY, FRACTURES): Vit D, 25-Hydroxy: 55.2 ng/mL (ref 30.0–100.0)

## 2020-02-17 LAB — HEMOGLOBIN A1C
Est. average glucose Bld gHb Est-mCnc: 194 mg/dL
Hgb A1c MFr Bld: 8.4 % — ABNORMAL HIGH (ref 4.8–5.6)

## 2020-02-18 ENCOUNTER — Other Ambulatory Visit: Payer: Self-pay

## 2020-02-18 ENCOUNTER — Encounter: Payer: Self-pay | Admitting: Family Medicine

## 2020-02-18 ENCOUNTER — Ambulatory Visit (INDEPENDENT_AMBULATORY_CARE_PROVIDER_SITE_OTHER): Payer: Medicare Other | Admitting: Family Medicine

## 2020-02-18 VITALS — BP 158/78 | HR 81 | Resp 16 | Ht 61.0 in | Wt 180.1 lb

## 2020-02-18 DIAGNOSIS — E1149 Type 2 diabetes mellitus with other diabetic neurological complication: Secondary | ICD-10-CM

## 2020-02-18 DIAGNOSIS — M7752 Other enthesopathy of left foot: Secondary | ICD-10-CM | POA: Diagnosis not present

## 2020-02-18 DIAGNOSIS — M79672 Pain in left foot: Secondary | ICD-10-CM | POA: Diagnosis not present

## 2020-02-18 DIAGNOSIS — I10 Essential (primary) hypertension: Secondary | ICD-10-CM | POA: Diagnosis not present

## 2020-02-18 DIAGNOSIS — E559 Vitamin D deficiency, unspecified: Secondary | ICD-10-CM

## 2020-02-18 DIAGNOSIS — E785 Hyperlipidemia, unspecified: Secondary | ICD-10-CM | POA: Diagnosis not present

## 2020-02-18 DIAGNOSIS — E790 Hyperuricemia without signs of inflammatory arthritis and tophaceous disease: Secondary | ICD-10-CM

## 2020-02-18 DIAGNOSIS — K219 Gastro-esophageal reflux disease without esophagitis: Secondary | ICD-10-CM

## 2020-02-18 MED ORDER — SPIRONOLACTONE 50 MG PO TABS: 50.0000 mg | ORAL_TABLET | Freq: Every day | ORAL | 3 refills | Status: DC

## 2020-02-18 MED ORDER — EMPAGLIFLOZIN 25 MG PO TABS: 25.0000 mg | ORAL_TABLET | Freq: Every day | ORAL | 5 refills | Status: DC

## 2020-02-18 NOTE — Patient Instructions (Addendum)
Follow-up in office with MD reevaluate blood sugar and blood pressure in 6 weeks.  Call if you need me sooner.  Your blood pressure is still high. Dose increase in spironolactone 50 mg 1 tablet daily.  It is okay for you to take 225 mg tablets together every day until they are finished.  Please do call pick up your new higher dose of 50 mg today however.  New additional medication for your blood sugar Jardiance 25 mg 1 tablet daily.  Please test and record your blood sugar every morning before breakfast.  The goal for your blood sugar at this time is between 80-1 20.  Congratulations on improved blood sugar.  Fasting lipid, cmp and EGFr and uric acid week before next visit  It is important that you exercise regularly at least 30 minutes 5 times a week. If you develop chest pain, have severe difficulty breathing, or feel very tired, stop exercising immediately and seek medical attention  Think about what you will eat, plan ahead. Choose " clean, green, fresh or frozen" over canned, processed or packaged foods which are more sugary, salty and fatty. 70 to 75% of food eaten should be vegetables and fruit. Three meals at set times with snacks allowed between meals, but they must be fruit or vegetables. Aim to eat over a 12 hour period , example 7 am to 7 pm, and STOP after  your last meal of the day. Drink water,generally about 64 ounces per day, no other drink is as healthy. Fruit juice is best enjoyed in a healthy way, by EATING the fruit. Thanks for choosing [facility], we consider it a privelige to serve you.

## 2020-02-18 NOTE — Assessment & Plan Note (Signed)
Uncontrolled  Increase spironolactone to 50 mg  DASH diet and commitment to daily physical activity for a minimum of 30 minutes discussed and encouraged, as a part of hypertension management. The importance of attaining a healthy weight is also discussed.  BP/Weight 02/18/2020 01/05/2020 12/25/2019 12/22/2019 12/21/2019 12/14/2019 1/93/7902  Systolic BP 409 735 - - 329 924 268  Diastolic BP 78 85 - - 79 79 69  Wt. (Lbs) 180.12 180 180.6 182 182 182.04 181  BMI 34.03 34.01 34.12 34.39 34.39 34.4 34.2

## 2020-02-19 ENCOUNTER — Ambulatory Visit: Payer: Medicare Other | Admitting: Dietician

## 2020-02-20 ENCOUNTER — Encounter: Payer: Self-pay | Admitting: Family Medicine

## 2020-02-20 NOTE — Assessment & Plan Note (Signed)
Improved but not at goal, add jardiance Michaela Peterson is reminded of the importance of commitment to daily physical activity for 30 minutes or more, as able and the need to limit carbohydrate intake to 30 to 60 grams per meal to help with blood sugar control.   The need to take medication as prescribed, test blood sugar as directed, and to call between visits if there is a concern that blood sugar is uncontrolled is also discussed.   Michaela Peterson is reminded of the importance of daily foot exam, annual eye examination, and good blood sugar, blood pressure and cholesterol control.  Diabetic Labs Latest Ref Rng & Units 02/16/2020 10/29/2019 07/22/2019 04/03/2019 12/10/2018  HbA1c 4.8 - 5.6 % 8.4(H) 8.9(H) 8.5(H) 7.8(H) 8.8(H)  Microalbumin mg/dL - - 16.6 - -  Micro/Creat Ratio 0.0 - 30.0 mg/g creat - - - - -  Chol <200 mg/dL - - 185 - 148  HDL > OR = 50 mg/dL - - 75 - 57  Calc LDL mg/dL (calc) - - 94 - 76  Triglycerides <150 mg/dL - - 71 - 73  Creatinine 0.57 - 1.00 mg/dL 1.13(H) 0.91 0.87 0.88 0.97   BP/Weight 02/18/2020 01/05/2020 12/25/2019 12/22/2019 12/21/2019 12/14/2019 10/04/2033  Systolic BP 597 416 - - 384 536 468  Diastolic BP 78 85 - - 79 79 69  Wt. (Lbs) 180.12 180 180.6 182 182 182.04 181  BMI 34.03 34.01 34.12 34.39 34.39 34.4 34.2   Foot/eye exam completion dates Latest Ref Rng & Units 08/25/2019 02/19/2018  Eye Exam No Retinopathy - -  Foot Form Completion - Done Done

## 2020-02-20 NOTE — Assessment & Plan Note (Signed)
  Patient re-educated about  the importance of commitment to a  minimum of 150 minutes of exercise per week as able.  The importance of healthy food choices with portion control discussed, as well as eating regularly and within a 12 hour window most days. The need to choose "clean , green" food 50 to 75% of the time is discussed, as well as to make water the primary drink and set a goal of 64 ounces water daily.    Weight /BMI 02/18/2020 01/05/2020 12/25/2019  WEIGHT 180 lb 1.9 oz 180 lb 180 lb 9.6 oz  HEIGHT 5\' 1"  5\' 1"  5\' 1"   BMI 34.03 kg/m2 34.01 kg/m2 34.12 kg/m2

## 2020-02-20 NOTE — Assessment & Plan Note (Signed)
Controlled, no change in medication  

## 2020-02-20 NOTE — Assessment & Plan Note (Signed)
Hyperlipidemia:Low fat diet discussed and encouraged.   Lipid Panel  Lab Results  Component Value Date   CHOL 185 07/22/2019   HDL 75 07/22/2019   LDLCALC 94 07/22/2019   TRIG 71 07/22/2019   CHOLHDL 2.5 07/22/2019  Controlled, no change in medication Updated lab needed at/ before next visit.

## 2020-02-20 NOTE — Assessment & Plan Note (Signed)
Continue 3 times weekly supplement

## 2020-02-20 NOTE — Progress Notes (Signed)
Michaela Peterson     MRN: [id number]      DOB: 1955/10/22   HPI Michaela Peterson is here for follow up and re-evaluation of chronic medical conditions, medication management and review of any available recent lab and radiology data.  Preventive health is updated, specifically  Cancer screening and Immunization.   Questions or concerns regarding consultations or procedures which the PT has had in the interim are  addressed. The PT denies any adverse reactions to current medications since the last visit.  There are no new concerns.  Denies polyuria, polydipsia, blurred vision , or hypoglycemic episodes.   ROS Denies recent fever or chills. Denies sinus pressure, nasal congestion, ear pain or sore throat. Denies chest congestion, productive cough or wheezing. Denies chest pains, palpitations and leg swelling Denies abdominal pain, nausea, vomiting,diarrhea or constipation.   Denies dysuria, frequency, hesitancy or incontinence. Denies uncontrolled  joint pain, swelling and limitation in mobility. Denies headaches, seizures, numbness, or tingling. Denies depression, anxiety or insomnia. Denies skin break down or rash.   PE  BP (!) 158/78   Pulse 81   Resp 16   Ht 5\' 1"  (1.549 m)   Wt 180 lb 1.9 oz (81.7 kg)   LMP 08/18/2012   SpO2 98%   BMI 34.03 kg/m   Patient alert and oriented and in no cardiopulmonary distress.  HEENT: No facial asymmetry, EOMI,     Neck supple .  Chest: Clear to auscultation bilaterally.  CVS: S1, S2 no murmurs, no S3.Regular rate.  ABD: Soft non tender.   Ext: No edema  MS: Adequate ROM spine, shoulders, hips and knees.  Skin: Intact, no ulcerations or rash noted.  Psych: Good eye contact, normal affect. Memory intact not anxious or depressed appearing.  CNS: CN 2-12 intact, power,  normal throughout.no focal deficits noted.   Assessment & Plan  Hypertension goal BP (blood pressure) < 140/90 Uncontrolled  Increase spironolactone to 50 mg  DASH  diet and commitment to daily physical activity for a minimum of 30 minutes discussed and encouraged, as a part of hypertension management. The importance of attaining a healthy weight is also discussed.  BP/Weight 02/18/2020 01/05/2020 12/25/2019 12/22/2019 12/21/2019 12/14/2019 7/34/1937  Systolic BP 902 409 - - 735 329 924  Diastolic BP 78 85 - - 79 79 69  Wt. (Lbs) 180.12 180 180.6 182 182 182.04 181  BMI 34.03 34.01 34.12 34.39 34.39 34.4 34.2       Hyperlipidemia LDL goal <100 Hyperlipidemia:Low fat diet discussed and encouraged.   Lipid Panel  Lab Results  Component Value Date   CHOL 185 07/22/2019   HDL 75 07/22/2019   LDLCALC 94 07/22/2019   TRIG 71 07/22/2019   CHOLHDL 2.5 07/22/2019  Controlled, no change in medication Updated lab needed at/ before next visit.      Morbid obesity ([place])  Patient re-educated about  the importance of commitment to a  minimum of 150 minutes of exercise per week as able.  The importance of healthy food choices with portion control discussed, as well as eating regularly and within a 12 hour window most days. The need to choose "clean , green" food 50 to 75% of the time is discussed, as well as to make water the primary drink and set a goal of 64 ounces water daily.    Weight /BMI 02/18/2020 01/05/2020 12/25/2019  WEIGHT 180 lb 1.9 oz 180 lb 180 lb 9.6 oz  HEIGHT 5\' 1"  5\' 1"  5\' 1"   BMI 34.03 kg/m2 34.01 kg/m2 34.12 kg/m2      Type 2 diabetes mellitus with neurological complications ([place]) Improved but not at goal, add jardiance Michaela Peterson is reminded of the importance of commitment to daily physical activity for 30 minutes or more, as able and the need to limit carbohydrate intake to 30 to 60 grams per meal to help with blood sugar control.   The need to take medication as prescribed, test blood sugar as directed, and to call between visits if there is a concern that blood sugar is uncontrolled is also discussed.   Michaela Peterson is  reminded of the importance of daily foot exam, annual eye examination, and good blood sugar, blood pressure and cholesterol control.  Diabetic Labs Latest Ref Rng & Units 02/16/2020 10/29/2019 07/22/2019 04/03/2019 12/10/2018  HbA1c 4.8 - 5.6 % 8.4(H) 8.9(H) 8.5(H) 7.8(H) 8.8(H)  Microalbumin mg/dL - - 16.6 - -  Micro/Creat Ratio 0.0 - 30.0 mg/g creat - - - - -  Chol <200 mg/dL - - 185 - 148  HDL > OR = 50 mg/dL - - 75 - 57  Calc LDL mg/dL (calc) - - 94 - 76  Triglycerides <150 mg/dL - - 71 - 73  Creatinine 0.57 - 1.00 mg/dL 1.13(H) 0.91 0.87 0.88 0.97   BP/Weight 02/18/2020 01/05/2020 12/25/2019 12/22/2019 12/21/2019 12/14/2019 4/78/2956  Systolic BP 213 086 - - 578 469 629  Diastolic BP 78 85 - - 79 79 69  Wt. (Lbs) 180.12 180 180.6 182 182 182.04 181  BMI 34.03 34.01 34.12 34.39 34.39 34.4 34.2   Foot/eye exam completion dates Latest Ref Rng & Units 08/25/2019 02/19/2018  Eye Exam No Retinopathy - -  Foot Form Completion - Done Done        Vitamin D deficiency Continue 3 times weekly supplement  GERD Controlled, no change in medication

## 2020-02-22 LAB — HM DIABETES EYE EXAM

## 2020-02-24 ENCOUNTER — Ambulatory Visit: Payer: Medicare Other | Admitting: Gastroenterology

## 2020-03-14 ENCOUNTER — Encounter: Payer: Self-pay | Admitting: Internal Medicine

## 2020-03-14 ENCOUNTER — Telehealth (INDEPENDENT_AMBULATORY_CARE_PROVIDER_SITE_OTHER): Payer: Medicare Other | Admitting: Internal Medicine

## 2020-03-14 ENCOUNTER — Other Ambulatory Visit: Payer: Self-pay

## 2020-03-14 DIAGNOSIS — N760 Acute vaginitis: Secondary | ICD-10-CM

## 2020-03-14 DIAGNOSIS — E1149 Type 2 diabetes mellitus with other diabetic neurological complication: Secondary | ICD-10-CM | POA: Diagnosis not present

## 2020-03-14 MED ORDER — FLUCONAZOLE 150 MG PO TABS: 150.0000 mg | ORAL_TABLET | ORAL | 0 refills | Status: DC

## 2020-03-14 NOTE — Progress Notes (Signed)
Virtual Visit via Telephone Note   This visit type was conducted due to national recommendations for restrictions regarding the COVID-19 Pandemic (e.g. social distancing) in an effort to limit this patient's exposure and mitigate transmission in our community.  Due to her co-morbid illnesses, this patient is at least at moderate risk for complications without adequate follow up.  This format is felt to be most appropriate for this patient at this time.  The patient did not have access to video technology/had technical difficulties with video requiring transitioning to audio format only (telephone).  All issues noted in this document were discussed and addressed.  No physical exam could be performed with this format.   Evaluation Performed:  Follow-up visit  Date:  03/14/2020   ID:  Michaela Peterson, [name] 05-23-1955, MRN [id number]  Patient Location: Home Provider Location: Office/Clinic  Participants: Patient Location of Patient: Home Location of Provider: Telehealth Consent was obtain for visit to be over via telehealth. I verified that I am speaking with the correct person using two identifiers.  PCP:  [name], MD   Chief Complaint:  Vulvar area itching  History of Present Illness:    Michaela Peterson is a 65 y.o. female with PMH of DM, HTN, HLD and GERD who has a televisit for c/o vulvar area itching. Patient had to take Prednisone short course for her foot pain few weeks ago, and has been having vulvar area itching since then. Patient denies vaginal discharge currently. She mentions that she has had vulvar area itching intermittently for long time. She denies fever, chills, nausea, vomiting, dysuria, hematuria or pelvic pain. Of note, patient also take Jardiance for her DM.  The patient does not have symptoms concerning for COVID-19 infection (fever, chills, cough, or new shortness of breath).   Past Medical, Surgical, Social History, Allergies, and Medications have been  Reviewed.  Past Medical History:  Diagnosis Date  . Adnexal mass    Left; followed by Dr. [name]  . Anemia, iron deficiency   . Arthritis   . At risk for cardiovascular event 06/17/2016  . Chronic low back pain   . Diabetes mellitus, type 2 ([place])   . Dyspnea 09/19/2018  . Essential hypertension   . Gastroparesis   . GERD (gastroesophageal reflux disease)   . MS (multiple sclerosis) ([place])   . Nonalcoholic fatty liver disease   . Obesity   . Obesity, diabetes, and hypertension syndrome ([place]) 03/12/2009   Qualifier: Diagnosis of  By: Moshe Cipro MD, Joycelyn Schmid    . Sleep apnea 2009  . Vaginitis 03/29/2018   Past Surgical History:  Procedure Laterality Date  . AGILE CAPSULE N/A 08/11/2014   Procedure: AGILE CAPSULE;  Surgeon: Daneil Dolin, MD;  Location: [facility];  Service: Endoscopy;  Laterality: N/A;  0700  . BLADDER SUSPENSION    . CESAREAN SECTION     x2   . CHOLECYSTECTOMY    . COLONOSCOPY  2010   Dr. [name]: tubular adenoma, few scattered diverticula  . COLONOSCOPY N/A 10/01/2013   Dr. Pixie Casino preparation. Normal rectum. Normal colonic mucosa  . COLONOSCOPY N/A 11/12/2018   sigmoid and descending colon diverticulosis. Two 4-5 mm polyps in ascending colon. (tubular adenomas). Surveillance in 5 years  . DILATION AND CURETTAGE OF UTERUS     4  . ESOPHAGOGASTRODUODENOSCOPY  2010   Dr. [name]: normal esophagus, small hiatal hernia, questionable pale duodenal mucosa but negative for celiac sprue.   . ESOPHAGOGASTRODUODENOSCOPY N/A 10/01/2013  Dr. Rourk:gastric polyps-status post biopsy. Otherwise, normal EGD. fundic gland polyp, negative H.pylori  . GIVENS CAPSULE STUDY N/A 09/08/2014   Poor prep but overall unremarkable   . KNEE ARTHROSCOPY W/ DEBRIDEMENT    . LAPAROSCOPY ABDOMEN DIAGNOSTIC     lysis of adhesions  . POLYPECTOMY  11/12/2018   Procedure: POLYPECTOMY;  Surgeon: Daneil Dolin, MD;  Location: [facility];  Service: Endoscopy;;  . ROTATOR CUFF REPAIR    .  TENDON LENGTHENING     Left wrist  . UMBILICAL HERNIA REPAIR       Current Meds  Medication Sig  . allopurinol (ZYLOPRIM) 300 MG tablet Take 1 tablet (300 mg total) by mouth daily.  Marland Kitchen aspirin EC 81 MG tablet Take 81 mg by mouth daily.  Marland Kitchen azelastine (ASTELIN) 0.1 % nasal spray Place 2 sprays into both nostrils at bedtime. Use in each nostril as directed  . Blood Glucose Monitoring Suppl (ONE TOUCH ULTRA 2) w/Device KIT USE TO TEST ONCE DAILY  . cetirizine (ZYRTEC) 10 MG tablet Take 10 mg by mouth daily.  . Cholecalciferol (DIALYVITE VITAMIN D 5000) 125 MCG (5000 UT) capsule Take 5,000 Units by mouth 3 (three) times a week.  . diphenhydrAMINE (BENADRYL) 25 MG tablet Take 25 mg by mouth every 6 (six) hours as needed for itching.  . empagliflozin (JARDIANCE) 25 MG TABS tablet Take 1 tablet (25 mg total) by mouth daily before breakfast.  . Ferrous Sulfate (IRON) 325 (65 Fe) MG TABS Take 1 tablet by mouth daily.  . fluconazole (DIFLUCAN) 150 MG tablet Take 1 tablet (150 mg total) by mouth once a week. Please take second dose only if you have persistent symptoms.  . fluticasone (FLONASE) 50 MCG/ACT nasal spray Place 2 sprays into both nostrils daily.   Marland Kitchen glipiZIDE (GLUCOTROL) 10 MG tablet TAKE 1 TABLET BY MOUTH  TWICE DAILY BEFORE MEALS  . metFORMIN (GLUCOPHAGE) 1000 MG tablet Take 1,000 mg by mouth 2 (two) times daily with a meal.  . metFORMIN (GLUCOPHAGE) 1000 MG tablet Take 1 tablet (1,000 mg total) by mouth 2 (two) times daily with a meal.  . montelukast (SINGULAIR) 10 MG tablet TAKE 1 TABLET BY MOUTH AT  BEDTIME  . NIFEdipine (ADALAT CC) 90 MG 24 hr tablet TAKE 1 TABLET BY MOUTH  DAILY  . Olopatadine HCl 0.2 % SOLN Apply 1 drop to eye daily.  Glory Rosebush DELICA LANCETS 29H MISC USE UP TO 4 TIMES DAILY AS  DIRECTED  . ONETOUCH ULTRA test strip USE TO TEST ONCE DAILY  . OVER THE COUNTER MEDICATION Apply 1 application topically daily as needed (pain). horse liniment cream  . pantoprazole  (PROTONIX) 40 MG tablet TAKE 1 TABLET BY MOUTH TWO  TIMES DAILY BEFORE MEALS (Patient taking differently: Take 40 mg by mouth daily.)  . rosuvastatin (CRESTOR) 5 MG tablet Take one tablet by mouth every Monday and Friday  . spironolactone (ALDACTONE) 50 MG tablet Take 1 tablet (50 mg total) by mouth daily.  Marland Kitchen triamcinolone cream (KENALOG) 0.1 % Apply 1 application topically daily as needed (rash).     Allergies:   Gabapentin, Ace inhibitors, Aspirin, Oxycodone, Penicillins, Rebif [interferon beta-1a], and Statins   ROS:   Please see the history of present illness.    Review of Systems  Constitutional: Negative for chills and fever.  HENT: Negative for congestion, ear discharge, ear pain, sinus pain and sore throat.   Eyes: Negative for pain and redness.  Respiratory: Negative for cough and  shortness of breath.   Cardiovascular: Negative for chest pain and palpitations.  Gastrointestinal: Negative for constipation, diarrhea, nausea and vomiting.  Genitourinary: Negative for dysuria and hematuria.  Musculoskeletal: Negative for myalgias.  Skin: Positive for itching.  Neurological: Negative for dizziness, sensory change, focal weakness and seizures.     Labs/Other Tests and Data Reviewed:    Recent Labs: 05/21/2019: TSH 2.15 07/22/2019: ALT 21 10/19/2019: Hemoglobin 12.0; Platelets 428 02/16/2020: BUN 20; Creatinine, Ser 1.13; Potassium 4.3; Sodium 140   Recent Lipid Panel Lab Results  Component Value Date/Time   CHOL 185 07/22/2019 11:05 AM   TRIG 71 07/22/2019 11:05 AM   HDL 75 07/22/2019 11:05 AM   CHOLHDL 2.5 07/22/2019 11:05 AM   LDLCALC 94 07/22/2019 11:05 AM    Wt Readings from Last 3 Encounters:  02/18/20 180 lb 1.9 oz (81.7 kg)  01/05/20 180 lb (81.6 kg)  12/25/19 180 lb 9.6 oz (81.9 kg)      ASSESSMENT & PLAN:    Vulvovaginitis Likely fungal Itching, recent exposure to steroids and is on Jardiance Fluconazole prescribed Okay to continue to use zinc  ointment  DM On Metformin and Jardiance If recurrent vulvovaginitis, may have to discontinue SGLTi and switch to alternate agent  Time:   Today, I have spent 22 minutes reviewing the chart, including problem list, medications, and with the patient with telehealth technology discussing the above problems.   Medication Adjustments/Labs and Tests Ordered: Current medicines are reviewed at length with the patient today.  Concerns regarding medicines are outlined above.   Tests Ordered: No orders of the defined types were placed in this encounter.   Medication Changes: Meds ordered this encounter  Medications  . fluconazole (DIFLUCAN) 150 MG tablet    Sig: Take 1 tablet (150 mg total) by mouth once a week. Please take second dose only if you have persistent symptoms.    Dispense:  2 tablet    Refill:  0     Note: This dictation was prepared with Dragon dictation along with smaller phrase technology. Similar sounding words can be transcribed inadequately or may not be corrected upon review. Any transcriptional errors that result from this process are unintentional.      Disposition:  Follow up  Signed, Lindell Spar, MD  03/14/2020 9:38 AM     [place]

## 2020-03-14 NOTE — Patient Instructions (Addendum)
Please take Fluconazole 150 mg once. If symptoms are persistent, please take second dose after 1 week.   Vaginitis  Vaginitis is irritation and swelling (inflammation) of the vagina. It happens when normal bacteria and yeast in the vagina grow too much. There are many types of this condition. Treatment will depend on the type you have. Follow these instructions at home: Lifestyle  Keep your vagina area clean and dry. ? Avoid using soap. ? Rinse the area with water.  Do not do the following until your doctor says it is okay: ? Wash and clean out the vagina (douche). ? Use tampons. ? Have sex.  Wipe from front to back after going to the bathroom.  Let air reach your vagina. ? Wear cotton underwear. ? Do not wear:  Underwear while you sleep.  Tight pants.  Thong underwear.  Underwear or nylons without a cotton panel. ? Take off any wet clothing, such as bathing suits, as soon as possible.  Use gentle, non-scented products. Do not use things that can irritate the vagina, such as fabric softeners. Avoid the following products if they are scented: ? Feminine sprays. ? Detergents. ? Tampons. ? Feminine hygiene products. ? Soaps or bubble baths.  Practice safe sex and use condoms. General instructions  Take over-the-counter and prescription medicines only as told by your doctor.  If you were prescribed an antibiotic medicine, take or use it as told by your doctor. Do not stop taking or using the antibiotic even if you start to feel better.  Keep all follow-up visits as told by your doctor. This is important. Contact a doctor if:  You have pain in your belly.  You have a fever.  Your symptoms last for more than 2-3 days. Get help right away if:  You have a fever and your symptoms get worse all of a sudden. Summary  Vaginitis is irritation and swelling of the vagina. It can happen when the normal bacteria and yeast in the vagina grow too much. There are many  types.  Treatment will depend on the type you have.  Do not douche, use tampons , or have sex until your health care provider approves. When you can return to sex, practice safe sex and use condoms. This information is not intended to replace advice given to you by your health care provider. Make sure you discuss any questions you have with your health care provider. Document Revised: 02/08/2017 Document Reviewed: 03/20/2016 Elsevier Patient Education  2020 [employer].

## 2020-03-25 DIAGNOSIS — I10 Essential (primary) hypertension: Secondary | ICD-10-CM | POA: Diagnosis not present

## 2020-03-25 DIAGNOSIS — E790 Hyperuricemia without signs of inflammatory arthritis and tophaceous disease: Secondary | ICD-10-CM | POA: Diagnosis not present

## 2020-03-25 DIAGNOSIS — E785 Hyperlipidemia, unspecified: Secondary | ICD-10-CM | POA: Diagnosis not present

## 2020-03-26 LAB — CMP14+EGFR
ALT: 18 IU/L (ref 0–32)
AST: 15 IU/L (ref 0–40)
Albumin/Globulin Ratio: 1.3 (ref 1.2–2.2)
Albumin: 4.3 g/dL (ref 3.8–4.8)
Alkaline Phosphatase: 105 IU/L (ref 44–121)
BUN/Creatinine Ratio: 19 (ref 12–28)
BUN: 20 mg/dL (ref 8–27)
Bilirubin Total: 0.2 mg/dL (ref 0.0–1.2)
CO2: 20 mmol/L (ref 20–29)
Calcium: 10 mg/dL (ref 8.7–10.3)
Chloride: 102 mmol/L (ref 96–106)
Creatinine, Ser: 1.06 mg/dL — ABNORMAL HIGH (ref 0.57–1.00)
GFR calc Af Amer: 64 mL/min/{1.73_m2} (ref >59)
GFR calc non Af Amer: 56 mL/min/{1.73_m2} — ABNORMAL LOW (ref >59)
Globulin, Total: 3.3 g/dL (ref 1.5–4.5)
Glucose: 122 mg/dL — ABNORMAL HIGH (ref 65–99)
Potassium: 4.9 mmol/L (ref 3.5–5.2)
Sodium: 139 mmol/L (ref 134–144)
Total Protein: 7.6 g/dL (ref 6.0–8.5)

## 2020-03-26 LAB — LIPID PANEL
Chol/HDL Ratio: 2.8 ratio (ref 0.0–4.4)
Cholesterol, Total: 157 mg/dL (ref 100–199)
HDL: 57 mg/dL (ref >39)
LDL Chol Calc (NIH): 88 mg/dL (ref 0–99)
Triglycerides: 60 mg/dL (ref 0–149)
VLDL Cholesterol Cal: 12 mg/dL (ref 5–40)

## 2020-03-26 LAB — URIC ACID: Uric Acid: 8.7 mg/dL — ABNORMAL HIGH (ref 3.0–7.2)

## 2020-03-30 ENCOUNTER — Encounter: Payer: Self-pay | Admitting: Family Medicine

## 2020-03-30 ENCOUNTER — Other Ambulatory Visit: Payer: Self-pay

## 2020-03-30 ENCOUNTER — Telehealth (INDEPENDENT_AMBULATORY_CARE_PROVIDER_SITE_OTHER): Payer: Medicare Other | Admitting: Family Medicine

## 2020-03-30 DIAGNOSIS — L089 Local infection of the skin and subcutaneous tissue, unspecified: Secondary | ICD-10-CM | POA: Insufficient documentation

## 2020-03-30 DIAGNOSIS — I10 Essential (primary) hypertension: Secondary | ICD-10-CM | POA: Diagnosis not present

## 2020-03-30 DIAGNOSIS — E1149 Type 2 diabetes mellitus with other diabetic neurological complication: Secondary | ICD-10-CM | POA: Diagnosis not present

## 2020-03-30 DIAGNOSIS — E790 Hyperuricemia without signs of inflammatory arthritis and tophaceous disease: Secondary | ICD-10-CM

## 2020-03-30 DIAGNOSIS — L729 Follicular cyst of the skin and subcutaneous tissue, unspecified: Secondary | ICD-10-CM

## 2020-03-30 DIAGNOSIS — R32 Unspecified urinary incontinence: Secondary | ICD-10-CM | POA: Insufficient documentation

## 2020-03-30 DIAGNOSIS — N39498 Other specified urinary incontinence: Secondary | ICD-10-CM

## 2020-03-30 MED ORDER — DOXYCYCLINE HYCLATE 100 MG PO TABS: 100.0000 mg | ORAL_TABLET | Freq: Two times a day (BID) | ORAL | 0 refills | Status: DC

## 2020-03-30 NOTE — Assessment & Plan Note (Signed)
Uncontrolled, but improving by patient report Updated lab needed at/ before next visit. Michaela Peterson is reminded of the importance of commitment to daily physical activity for 30 minutes or more, as able and the need to limit carbohydrate intake to 30 to 60 grams per meal to help with blood sugar control.   The need to take medication as prescribed, test blood sugar as directed, and to call between visits if there is a concern that blood sugar is uncontrolled is also discussed.   Michaela Peterson is reminded of the importance of daily foot exam, annual eye examination, and good blood sugar, blood pressure and cholesterol control.  Diabetic Labs Latest Ref Rng & Units 03/25/2020 02/16/2020 10/29/2019 07/22/2019 04/03/2019  HbA1c 4.8 - 5.6 % - 8.4(H) 8.9(H) 8.5(H) 7.8(H)  Microalbumin mg/dL - - - 16.6 -  Micro/Creat Ratio 0.0 - 30.0 mg/g creat - - - - -  Chol 100 - 199 mg/dL 157 - - 185 -  HDL >39 mg/dL 57 - - 75 -  Calc LDL 0 - 99 mg/dL 88 - - 94 -  Triglycerides 0 - 149 mg/dL 60 - - 71 -  Creatinine 0.57 - 1.00 mg/dL 1.06(H) 1.13(H) 0.91 0.87 0.88   BP/Weight 02/18/2020 01/05/2020 12/25/2019 12/22/2019 12/21/2019 12/14/2019 9/93/7169  Systolic BP 678 938 - - 101 751 025  Diastolic BP 78 85 - - 79 79 69  Wt. (Lbs) 180.12 180 180.6 182 182 182.04 181  BMI 34.03 34.01 34.12 34.39 34.39 34.4 34.2   Foot/eye exam completion dates Latest Ref Rng & Units 08/25/2019 02/19/2018  Eye Exam No Retinopathy - -  Foot Form Completion - Done Done

## 2020-03-30 NOTE — Assessment & Plan Note (Signed)
Inadequately treated, non compliance with medication th cause , this is discussed with pt, re eval in 5 months

## 2020-03-30 NOTE — Progress Notes (Signed)
Virtual Visit via Telephone Note  I connected with Michaela Peterson on 03/30/20 at  9:20 AM EST by telephone and verified that I am speaking with the correct person using two identifiers.  Location: Patient: home Provider: work   I discussed the limitations, risks, security and privacy concerns of performing an evaluation and management service by telephone and the availability of in person appointments. I also discussed with the patient that there may be a patient responsible charge related to this service. The patient expressed understanding and agreed to proceed.   History of Present Illness: 2 week h/o boil on cheek, has squeezed, and got liquid out of the boil, started uses daily x 1 week, much smaller, down from a dime size, also has similar boils in different parts of her scalp, none currently Skin irritation in groin for over 1 year, uses depends and changes when wet, no purulent drainage, fever or chills Reports FBG between 100 to 140, much improved with me adherence and healthy food choice Denies polyuria, polydipsia, blurred vision , or hypoglycemic episodes.  Denies recent fever or chills. Denies sinus pressure, nasal congestion, ear pain or sore throat. Denies chest congestion, productive cough or wheezing. Denies chest pains, palpitations and leg swelling Denies abdominal pain, nausea, vomiting,diarrhea or constipation.   Denies dysuria, frequency, hesitancy. Denies joint pain, swelling and limitation in mobility. Denies headaches, seizures, numbness, or tingling. Denies depression, anxiety or insomnia.      Observations/Objective: LMP 08/18/2012  Good communication with no confusion and intact memory. Alert and oriented x 3 No signs of respiratory distress during speech    Assessment and Plan:  Type 2 diabetes mellitus with neurological complications ([facility]) Uncontrolled, but improving by patient report Updated lab needed at/ before next visit. Michaela Peterson is  reminded of the importance of commitment to daily physical activity for 30 minutes or more, as able and the need to limit carbohydrate intake to 30 to 60 grams per meal to help with blood sugar control.   The need to take medication as prescribed, test blood sugar as directed, and to call between visits if there is a concern that blood sugar is uncontrolled is also discussed.   Michaela Peterson is reminded of the importance of daily foot exam, annual eye examination, and good blood sugar, blood pressure and cholesterol control.  Diabetic Labs Latest Ref Rng & Units 03/25/2020 02/16/2020 10/29/2019 07/22/2019 04/03/2019  HbA1c 4.8 - 5.6 % - 8.4(H) 8.9(H) 8.5(H) 7.8(H)  Microalbumin mg/dL - - - 16.6 -  Micro/Creat Ratio 0.0 - 30.0 mg/g creat - - - - -  Chol 100 - 199 mg/dL 157 - - 185 -  HDL >39 mg/dL 57 - - 75 -  Calc LDL 0 - 99 mg/dL 88 - - 94 -  Triglycerides 0 - 149 mg/dL 60 - - 71 -  Creatinine 0.57 - 1.00 mg/dL 1.06(H) 1.13(H) 0.91 0.87 0.88   BP/Weight 02/18/2020 01/05/2020 12/25/2019 12/22/2019 12/21/2019 12/14/2019 05/31/252  Systolic BP 270 623 - - 762 831 517  Diastolic BP 78 85 - - 79 79 69  Wt. (Lbs) 180.12 180 180.6 182 182 182.04 181  BMI 34.03 34.01 34.12 34.39 34.39 34.4 34.2   Foot/eye exam completion dates Latest Ref Rng & Units 08/25/2019 02/19/2018  Eye Exam No Retinopathy - -  Foot Form Completion - Done Done        Hypertension goal BP (blood pressure) < 140/90 DASH diet and commitment to daily physical activity for a minimum  of 30 minutes discussed and encouraged, as a part of hypertension management. The importance of attaining a healthy weight is also discussed.  BP/Weight 02/18/2020 01/05/2020 12/25/2019 12/22/2019 12/21/2019 12/14/2019 05/01/2540  Systolic BP 706 237 - - 628 315 176  Diastolic BP 78 85 - - 79 79 69  Wt. (Lbs) 180.12 180 180.6 182 182 182.04 181  BMI 34.03 34.01 34.12 34.39 34.39 34.4 34.2   Re  eval in office     Infected cyst of skin 5 day  antibiotic course prescribed  For cyst on face and on scalp  Morbid obesity ([place])  Patient re-educated about  the importance of commitment to a  minimum of 150 minutes of exercise per week as able.  The importance of healthy food choices with portion control discussed, as well as eating regularly and within a 12 hour window most days. The need to choose "clean , green" food 50 to 75% of the time is discussed, as well as to make water the primary drink and set a goal of 64 ounces water daily.    Weight /BMI 02/18/2020 01/05/2020 12/25/2019  WEIGHT 180 lb 1.9 oz 180 lb 180 lb 9.6 oz  HEIGHT 5\' 1"  5\' 1"  5\' 1"   BMI 34.03 kg/m2 34.01 kg/m2 34.12 kg/m2      Hyperuricemia Inadequately treated, non compliance with medication th cause , this is discussed with pt, re eval in 5 months  Urinary incontinence Uses depends at least 5 days/ week, wearing when wet with diaper rash complaint Advised to change often, use vaseline or cornstarch for skin protection   Follow Up Instructions:    I discussed the assessment and treatment plan with the patient. The patient was provided an opportunity to ask questions and all were answered. The patient agreed with the plan and demonstrated an understanding of the instructions.   The patient was advised to call back or seek an in-person evaluation if the symptoms worsen or if the condition fails to improve as anticipated.  I provided 22  minutes of non-face-to-face time during this encounter.   Tula Nakayama, MD

## 2020-03-30 NOTE — Assessment & Plan Note (Signed)
5 day antibiotic course prescribed  For cyst on face and on scalp

## 2020-03-30 NOTE — Patient Instructions (Addendum)
F/U in 5 months, call if you need me sooner  Need eye exam, please check with pt at checkout and follow through on this  Blood sugar report sounds very good, please continue to work on this  Commit to allopurinol daily  Non fast hBA1C on March 7 or shortly after, we will contact you with result, if not good , you will need a visit.  Five day antibiotic course prescribed for skin infection  Change undergarments often,  USe vaseline and/ or cornstarch for skin protection  It is important that you exercise regularly at least 30 minutes 5 times a week. If you develop chest pain, have severe difficulty breathing, or feel very tired, stop exercising immediately and seek medical attention  Think about what you will eat, plan ahead. Choose " clean, green, fresh or frozen" over canned, processed or packaged foods which are more sugary, salty and fatty. 70 to 75% of food eaten should be vegetables and fruit. Three meals at set times with snacks allowed between meals, but they must be fruit or vegetables. Aim to eat over a 12 hour period , example 7 am to 7 pm, and STOP after  your last meal of the day. Drink water,generally about 64 ounces per day, no other drink is as healthy. Fruit juice is best enjoyed in a healthy way, by EATING the fruit. Thanks for choosing [facility], we consider it a privelige to serve you.

## 2020-03-30 NOTE — Assessment & Plan Note (Signed)
DASH diet and commitment to daily physical activity for a minimum of 30 minutes discussed and encouraged, as a part of hypertension management. The importance of attaining a healthy weight is also discussed.  BP/Weight 02/18/2020 01/05/2020 12/25/2019 12/22/2019 12/21/2019 12/14/2019 1/61/0960  Systolic BP 454 098 - - 119 147 829  Diastolic BP 78 85 - - 79 79 69  Wt. (Lbs) 180.12 180 180.6 182 182 182.04 181  BMI 34.03 34.01 34.12 34.39 34.39 34.4 34.2   Re  eval in office

## 2020-03-30 NOTE — Assessment & Plan Note (Signed)
Uses depends at least 5 days/ week, wearing when wet with diaper rash complaint Advised to change often, use vaseline or cornstarch for skin protection

## 2020-03-30 NOTE — Assessment & Plan Note (Signed)
  Patient re-educated about  the importance of commitment to a  minimum of 150 minutes of exercise per week as able.  The importance of healthy food choices with portion control discussed, as well as eating regularly and within a 12 hour window most days. The need to choose "clean , green" food 50 to 75% of the time is discussed, as well as to make water the primary drink and set a goal of 64 ounces water daily.    Weight /BMI 02/18/2020 01/05/2020 12/25/2019  WEIGHT 180 lb 1.9 oz 180 lb 180 lb 9.6 oz  HEIGHT 5' 1" 5' 1" 5' 1"  BMI 34.03 kg/m2 34.01 kg/m2 34.12 kg/m2     

## 2020-04-04 NOTE — Progress Notes (Signed)
Referring Provider: [name], MD Primary Care Physician:  [name], MD Primary GI Physician: Dr. [name]  Chief Complaint  Patient presents with  . Follow-up    HPI:   Michaela Peterson is a 65 y.o. female  with a history of low normal ferritin, low iron, Hgb 12 range historically. Colonoscopy/EGD in July 2015 normal aside from gastric polyps (fundic gland polyps on pathology. Capsule unremarkable June 2016.History of constipation, GERD, gastroparesis (abnormal GES in 2016). Mildly elevated transaminases in the past with plans to pursue ultrasound if continued to remain elevated; however, LFTs normalized in June 2020 and have remained within normal limits.  Most recent colonoscopy 11/12/2018 with 2 tubular adenomas, surveillance due in 2025.  She is presenting today for follow-up of dyspepsia and constipation.    Last seen in our office 10/21/2019.  She reported postprandial epigastric pain, associated nausea, occasional vomiting.  Pain relieved after vomiting.  Occasional early satiety.  No vomiting of undigested food.  On Protonix daily.  Discussed EGD, but patient preferred to hold off on this until she tried Protonix twice daily.  Constipation also not ideally managed.  Plan to increase Protonix to twice daily and start Benefiber.  Requested progress report in 3-4 weeks and return in 3-4 months regardless.  No progress report received.  Patient has canceled 2 follow-up appointments.  Most recent hemoglobin 12.0, ferritin 33 in August 2021. Creatinine 1.13 in December 2021 and 1.06 in January 2022.   Today: She reports chronic history of constipation.  In general, she has 1 good sized BM every morning which is followed by about 3 small BMs throughout the day.  Occasionally, she will move her bowels every other day.  Some sensation of incomplete emptying.  If she skips days between BMs and becomes constipated, she develops abdominal bloating, dyspepsia, nausea, and occasional  vomiting.  She experienced constipation over this past weekend.  She took [employer] brand fiber supplement on Sunday and Monday and started to have good bowel movements thereafter.  She is feeling improved today.  Still some mild bloated sensation like she hasn't completely emptied but she hasn't taken fiber since Monday.  She never tried taking fiber supplement on a daily basis.  She is concerned about developing diarrhea.  Her last flare of constipation was a few months ago.  Aside from constipation flares, she denies any dyspepsia, nausea, or vomiting.  States she never increase Protonix to twice daily on a regular basis.  Occasionally, she remembers to take the second dose.  GERD well controlled as long as she avoids tomatoes.  No dysphagia.  Denies BRBPR or melena.  She has been working on weight loss.  She has cut back on her meals and trying to do some exercising.  She has lost 5 pounds over the last 5 months.  Past Medical History:  Diagnosis Date  . Adnexal mass    Left; followed by Dr. [name]  . Anemia, iron deficiency   . Arthritis   . At risk for cardiovascular event 06/17/2016  . Chronic low back pain   . Diabetes mellitus, type 2 ([place])   . Dyspnea 09/19/2018  . Essential hypertension   . Gastroparesis   . GERD (gastroesophageal reflux disease)   . MS (multiple sclerosis) ([place])   . Nonalcoholic fatty liver disease   . Obesity   . Obesity, diabetes, and hypertension syndrome ([place]) 03/12/2009   Qualifier: Diagnosis of  By: Moshe Cipro MD, Joycelyn Schmid    . Sleep  apnea 2009  . Vaginitis 03/29/2018    Past Surgical History:  Procedure Laterality Date  . AGILE CAPSULE N/A 08/11/2014   Procedure: AGILE CAPSULE;  Surgeon: Daneil Dolin, MD;  Location: [facility];  Service: Endoscopy;  Laterality: N/A;  0700  . BLADDER SUSPENSION    . CESAREAN SECTION     x2   . CHOLECYSTECTOMY    . COLONOSCOPY  2010   Dr. [name]: tubular adenoma, few scattered diverticula  . COLONOSCOPY N/A 10/01/2013    Dr. Pixie Casino preparation. Normal rectum. Normal colonic mucosa  . COLONOSCOPY N/A 11/12/2018   sigmoid and descending colon diverticulosis. Two 4-5 mm polyps in ascending colon. (tubular adenomas). Surveillance in 5 years  . DILATION AND CURETTAGE OF UTERUS     4  . ESOPHAGOGASTRODUODENOSCOPY  2010   Dr. [name]: normal esophagus, small hiatal hernia, questionable pale duodenal mucosa but negative for celiac sprue.   . ESOPHAGOGASTRODUODENOSCOPY N/A 10/01/2013   Dr. Rourk:gastric polyps-status post biopsy. Otherwise, normal EGD. fundic gland polyp, negative H.pylori  . GIVENS CAPSULE STUDY N/A 09/08/2014   Poor prep but overall unremarkable   . KNEE ARTHROSCOPY W/ DEBRIDEMENT    . LAPAROSCOPY ABDOMEN DIAGNOSTIC     lysis of adhesions  . POLYPECTOMY  11/12/2018   Procedure: POLYPECTOMY;  Surgeon: Daneil Dolin, MD;  Location: [facility];  Service: Endoscopy;;  . ROTATOR CUFF REPAIR    . TENDON LENGTHENING     Left wrist  . UMBILICAL HERNIA REPAIR      Current Outpatient Medications  Medication Sig Dispense Refill  . allopurinol (ZYLOPRIM) 300 MG tablet Take 1 tablet (300 mg total) by mouth daily. 90 tablet 2  . aspirin EC 81 MG tablet Take 81 mg by mouth daily.    Marland Kitchen azelastine (ASTELIN) 0.1 % nasal spray Place 2 sprays into both nostrils at bedtime. Use in each nostril as directed 30 mL 3  . Blood Glucose Monitoring Suppl (ONE TOUCH ULTRA 2) w/Device KIT USE TO TEST ONCE DAILY 1 kit 0  . cetirizine (ZYRTEC) 10 MG tablet Take 10 mg by mouth daily.    . Cholecalciferol (DIALYVITE VITAMIN D 5000) 125 MCG (5000 UT) capsule Take 5,000 Units by mouth 3 (three) times a week.    . diphenhydrAMINE (BENADRYL) 25 MG tablet Take 25 mg by mouth every 6 (six) hours as needed for itching.    Marland Kitchen doxycycline (VIBRA-TABS) 100 MG tablet Take 1 tablet (100 mg total) by mouth 2 (two) times daily. 10 tablet 0  . empagliflozin (JARDIANCE) 25 MG TABS tablet Take 1 tablet (25 mg total) by mouth daily before  breakfast. 30 tablet 5  . Ferrous Sulfate (IRON) 325 (65 Fe) MG TABS Take 1 tablet by mouth daily.    Marland Kitchen FIBER PO Take by mouth as needed.    . fluticasone (FLONASE) 50 MCG/ACT nasal spray Place 2 sprays into both nostrils daily.     Marland Kitchen glipiZIDE (GLUCOTROL) 10 MG tablet TAKE 1 TABLET BY MOUTH  TWICE DAILY BEFORE MEALS 180 tablet 3  . metFORMIN (GLUCOPHAGE) 1000 MG tablet Take 1,000 mg by mouth 2 (two) times daily with a meal.    . metFORMIN (GLUCOPHAGE) 1000 MG tablet Take 1 tablet (1,000 mg total) by mouth 2 (two) times daily with a meal. 180 tablet 3  . montelukast (SINGULAIR) 10 MG tablet TAKE 1 TABLET BY MOUTH AT  BEDTIME 90 tablet 3  . NIFEdipine (ADALAT CC) 90 MG 24 hr tablet TAKE 1 TABLET  BY MOUTH  DAILY 90 tablet 3  . Olopatadine HCl 0.2 % SOLN Apply 1 drop to eye daily.    Glory Rosebush DELICA LANCETS 18A MISC USE UP TO 4 TIMES DAILY AS  DIRECTED 400 each 1  . ONETOUCH ULTRA test strip USE TO TEST ONCE DAILY 100 strip 3  . OVER THE COUNTER MEDICATION Apply 1 application topically daily as needed (pain). horse liniment cream    . pantoprazole (PROTONIX) 40 MG tablet TAKE 1 TABLET BY MOUTH TWO  TIMES DAILY BEFORE MEALS (Patient taking differently: Take 40 mg by mouth daily.) 180 tablet 3  . rosuvastatin (CRESTOR) 5 MG tablet Take one tablet by mouth every Monday and Friday 32 tablet 3  . spironolactone (ALDACTONE) 50 MG tablet Take 1 tablet (50 mg total) by mouth daily. 30 tablet 3  . triamcinolone cream (KENALOG) 0.1 % Apply 1 application topically daily as needed (rash).     No current facility-administered medications for this visit.    Allergies as of 04/06/2020 - Review Complete 04/06/2020  Allergen Reaction Noted  . Gabapentin Shortness Of Breath 02/13/2017  . Ace inhibitors Cough 03/26/2012  . Aspirin    . Oxycodone Hives 04/01/2018  . Penicillins Hives   . Rebif [interferon beta-1a] Other (See Comments) 05/01/2018  . Statins  10/11/2012    Family History  Problem Relation  Age of Onset  . Hypertension Sister   . Diabetes Sister   . Heart disease Sister   . Kidney failure Sister   . Heart failure Mother   . Hypertension Mother   . Arthritis Mother   . Stroke Father   . Hypertension Son   . Hypertension Sister   . Hypertension Brother   . Mental illness Brother   . Colon cancer Maternal Aunt     Social History   Socioeconomic History  . Marital status: Married    Spouse name: [name]   . Number of children: 4  . Years of education: 80  . Highest education level: Not on file  Occupational History  . Occupation: unemployed     Fish farm manager: UNEMPLOYED  Tobacco Use  . Smoking status: Never Smoker  . Smokeless tobacco: Never Used  . Tobacco comment: patient lives with a smoker   Vaping Use  . Vaping Use: Never used  Substance and Sexual Activity  . Alcohol use: No  . Drug use: No  . Sexual activity: Yes    Birth control/protection: Post-menopausal  Other Topics Concern  . Not on file  Social History Narrative   Patient lives at home with husband [name].    Patient has 4 children.    Patient has a some college.    Patient is right handed.    Patient is currently not working.    Social Determinants of Health   Financial Resource Strain: Low Risk   . Difficulty of Paying Living Expenses: Not very hard  Food Insecurity: No Food Insecurity  . Worried About Charity fundraiser in the Last Year: Never true  . Ran Out of Food in the Last Year: Never true  Transportation Needs: No Transportation Needs  . Lack of Transportation (Medical): No  . Lack of Transportation (Non-Medical): No  Physical Activity: Insufficiently Active  . Days of Exercise per Week: 1 day  . Minutes of Exercise per Session: 20 min  Stress: No Stress Concern Present  . Feeling of Stress : Only a little  Social Connections: Moderately Isolated  . Frequency of Communication with  Friends and Family: More than three times a week  . Frequency of Social Gatherings with Friends and  Family: More than three times a week  . Attends Religious Services: Never  . Active Member of Clubs or Organizations: No  . Attends Archivist Meetings: Never  . Marital Status: Married    Review of Systems: Gen: Denies fever, chills, cold or flulike symptoms, presyncope, syncope.  CV: Denies chest pain or palpitations Resp: Denies dyspnea.  Admits to intermittent cough GI: See HPI Heme: See HPI  Physical Exam: BP (!) 142/85   Pulse 83   Temp 97.8 F (36.6 C)   Ht 5' 1"  (1.549 m)   Wt 180 lb 12.8 oz (82 kg)   LMP 08/18/2012   BMI 34.16 kg/m  General:   Alert and oriented. No distress noted. Pleasant and cooperative.  Head:  Normocephalic and atraumatic. Eyes:  Conjuctiva clear without scleral icterus. Heart:  S1, S2 present without murmurs appreciated. Lungs:  Clear to auscultation bilaterally. No wheezes, rales, or rhonchi. No distress.  Abdomen:  +BS, soft, non-tender and non-distended. No rebound or guarding. No HSM or masses noted. Msk:  Symmetrical without gross deformities. Normal posture. Extremities:  Without edema. Boot on left foot secondary to "inflamed tendon".   Neurologic:  Alert and  oriented x4 Psych:  Normal mood and affect.

## 2020-04-06 ENCOUNTER — Other Ambulatory Visit: Payer: Self-pay

## 2020-04-06 ENCOUNTER — Ambulatory Visit: Payer: Medicare Other | Admitting: Gastroenterology

## 2020-04-06 ENCOUNTER — Encounter: Payer: Self-pay | Admitting: Gastroenterology

## 2020-04-06 VITALS — BP 142/85 | HR 83 | Temp 97.8°F | Ht 61.0 in | Wt 180.8 lb

## 2020-04-06 DIAGNOSIS — K5900 Constipation, unspecified: Secondary | ICD-10-CM | POA: Diagnosis not present

## 2020-04-06 DIAGNOSIS — K219 Gastro-esophageal reflux disease without esophagitis: Secondary | ICD-10-CM

## 2020-04-06 LAB — HM DIABETES EYE EXAM

## 2020-04-06 NOTE — Assessment & Plan Note (Addendum)
65 year old female with chronic history of constipation.  Constipation is intermittent and associated with bloating, dyspepsia, nausea, and occasional vomiting.  She recently experienced an episode of constipation over the weekend, but symptoms are improving with addition of fiber.  Reports last episode of constipation prior to this was several months ago.  In general, she feels her bowels move fairly well; however, suspect she may not be emptying completely as she reports a baseline of 1 BM in the morning and passing small BMs about 3 additional times a day.  No BRBPR or melena.  No unintentional weight loss.  Colonoscopy up-to-date in September 2020, due for surveillance in 2025.  Plan: I have recommended she try adding fiber supplement every day.  If she develops diarrhea, she may take a half dose or take fiber every other day. Explained that she can adjust the dose as needed with goal of bowels moving well every day and preventing occasional constipation.  Follow-up in 4 months or sooner if needed.

## 2020-04-06 NOTE — Progress Notes (Signed)
Cc'ed to pcp °

## 2020-04-06 NOTE — Assessment & Plan Note (Signed)
Chronic.  Well-controlled on Protonix 40 mg daily.  Had previously recommended increasing Protonix to twice daily in August 2021 due to dyspepsia, but this seems to be more associated with constipation which is addressed above.  Additionally, she is very rarely taking her second dose of Protonix at this point.  Advise she resume taking Protonix once daily and follow-up in 4 months.

## 2020-04-06 NOTE — Patient Instructions (Signed)
Please start taking your fiber supplement every day. Please try this for at least 1 week. If you develop diarrhea, try taking a half dose or taking fiber every other day.  The goal is to prevent constipation and keep your bowels moving well every day.  You may resume taking Protonix once daily.  We will plan to follow-up with you in 4 months.  Please call with any questions or concerns prior to your next visit.  It was nice meeting you today!  Aliene Altes, PA-C [facility]

## 2020-04-12 DIAGNOSIS — M7752 Other enthesopathy of left foot: Secondary | ICD-10-CM | POA: Diagnosis not present

## 2020-04-12 DIAGNOSIS — M79672 Pain in left foot: Secondary | ICD-10-CM | POA: Diagnosis not present

## 2020-04-25 DIAGNOSIS — M79675 Pain in left toe(s): Secondary | ICD-10-CM | POA: Diagnosis not present

## 2020-04-25 DIAGNOSIS — L110 Acquired keratosis follicularis: Secondary | ICD-10-CM | POA: Diagnosis not present

## 2020-04-25 DIAGNOSIS — M79674 Pain in right toe(s): Secondary | ICD-10-CM | POA: Diagnosis not present

## 2020-04-25 DIAGNOSIS — E1140 Type 2 diabetes mellitus with diabetic neuropathy, unspecified: Secondary | ICD-10-CM | POA: Diagnosis not present

## 2020-04-25 DIAGNOSIS — I739 Peripheral vascular disease, unspecified: Secondary | ICD-10-CM | POA: Diagnosis not present

## 2020-04-25 DIAGNOSIS — M79672 Pain in left foot: Secondary | ICD-10-CM | POA: Diagnosis not present

## 2020-04-25 DIAGNOSIS — M79671 Pain in right foot: Secondary | ICD-10-CM | POA: Diagnosis not present

## 2020-05-03 DIAGNOSIS — M7752 Other enthesopathy of left foot: Secondary | ICD-10-CM | POA: Diagnosis not present

## 2020-05-03 DIAGNOSIS — M25579 Pain in unspecified ankle and joints of unspecified foot: Secondary | ICD-10-CM | POA: Diagnosis not present

## 2020-05-03 DIAGNOSIS — M79672 Pain in left foot: Secondary | ICD-10-CM | POA: Diagnosis not present

## 2020-05-03 DIAGNOSIS — G5750 Tarsal tunnel syndrome, unspecified lower limb: Secondary | ICD-10-CM | POA: Diagnosis not present

## 2020-05-08 ENCOUNTER — Other Ambulatory Visit: Payer: Self-pay | Admitting: Nurse Practitioner

## 2020-05-08 ENCOUNTER — Other Ambulatory Visit: Payer: Self-pay | Admitting: Family Medicine

## 2020-05-16 DIAGNOSIS — E1149 Type 2 diabetes mellitus with other diabetic neurological complication: Secondary | ICD-10-CM | POA: Diagnosis not present

## 2020-05-17 ENCOUNTER — Telehealth: Payer: Self-pay

## 2020-05-17 LAB — HEMOGLOBIN A1C
Est. average glucose Bld gHb Est-mCnc: 180 mg/dL
Hgb A1c MFr Bld: 7.9 % — ABNORMAL HIGH (ref 4.8–5.6)

## 2020-05-17 NOTE — Telephone Encounter (Signed)
Uric acid cannot be added on, pt having soreness ontop of toes, now thinks it is from the boot she is wearing. Will call back if it continues

## 2020-05-17 NOTE — Telephone Encounter (Signed)
Pt thinks that her URIC acid is up , can blood work be done, can you take it from the labs yesterday

## 2020-05-20 ENCOUNTER — Other Ambulatory Visit: Payer: Self-pay

## 2020-05-20 DIAGNOSIS — E1149 Type 2 diabetes mellitus with other diabetic neurological complication: Secondary | ICD-10-CM

## 2020-05-20 DIAGNOSIS — E785 Hyperlipidemia, unspecified: Secondary | ICD-10-CM

## 2020-05-20 DIAGNOSIS — I10 Essential (primary) hypertension: Secondary | ICD-10-CM

## 2020-05-31 DIAGNOSIS — M7662 Achilles tendinitis, left leg: Secondary | ICD-10-CM | POA: Diagnosis not present

## 2020-05-31 DIAGNOSIS — M79672 Pain in left foot: Secondary | ICD-10-CM | POA: Diagnosis not present

## 2020-07-08 ENCOUNTER — Other Ambulatory Visit: Payer: Self-pay | Admitting: Family Medicine

## 2020-07-11 DIAGNOSIS — M79675 Pain in left toe(s): Secondary | ICD-10-CM | POA: Diagnosis not present

## 2020-07-11 DIAGNOSIS — L110 Acquired keratosis follicularis: Secondary | ICD-10-CM | POA: Diagnosis not present

## 2020-07-11 DIAGNOSIS — M79671 Pain in right foot: Secondary | ICD-10-CM | POA: Diagnosis not present

## 2020-07-11 DIAGNOSIS — I739 Peripheral vascular disease, unspecified: Secondary | ICD-10-CM | POA: Diagnosis not present

## 2020-07-11 DIAGNOSIS — M79674 Pain in right toe(s): Secondary | ICD-10-CM | POA: Diagnosis not present

## 2020-07-11 DIAGNOSIS — M79672 Pain in left foot: Secondary | ICD-10-CM | POA: Diagnosis not present

## 2020-07-11 DIAGNOSIS — E1140 Type 2 diabetes mellitus with diabetic neuropathy, unspecified: Secondary | ICD-10-CM | POA: Diagnosis not present

## 2020-07-19 ENCOUNTER — Other Ambulatory Visit: Payer: Self-pay | Admitting: Family Medicine

## 2020-07-22 ENCOUNTER — Other Ambulatory Visit: Payer: Self-pay

## 2020-07-22 MED ORDER — ONETOUCH DELICA LANCETS 33G MISC: 1 refills | Status: DC

## 2020-08-03 NOTE — Progress Notes (Signed)
Referring Provider: [name], MD Primary Care Physician:  [name], MD Primary GI Physician: Dr. [name]  Chief Complaint  Patient presents with  . Nausea    W/ vomiting 1-2 times per week  . cosntipation/diarrhea    alternates    HPI:   Michaela Peterson is a 65 y.o. female with a history of IDA s/p colonoscopy/EGD in July 2015 normal aside from gastric polyps (fundic gland polyps on pathology, capsule unremarkable June 2016. Hemoglobin remaining stable in 12 range.History of constipation, GERD, gastroparesis (abnormal GES in 2016). Mildly elevated transaminases in the past, but normalized in June 2020 and have remained within normal limits.  Most recent colonoscopy 11/12/2018 with 2 tubular adenomas, surveillance due in 2025.  She is presenting today for follow-up.  Last seen in our office 04/06/2020.  GERD was well controlled.  She never increase Protonix to twice daily as previously recommended.  Stated her symptoms remain well controlled as long as she avoids tomatoes.  Denies dysphagia.  Reported chronic history of constipation.  Generally, bowels moving daily, but some sensation of incomplete emptying.  If skipping days between bowel movements, she has abdominal bloating, dyspepsia, nausea, and occasional vomiting.  She reported her last episode of constipation improved with a fiber supplement.  Recommended adding fiber supplement daily, continue Protonix 40 mg once daily, and follow-up in 4 months.  Today:  Over the last month, she has had more trouble with constipation, nausea, and early satiety.  States she slacked up on MiraLAX and fiber for no specific reason.  In general, she has at least a very small bowel movement every day.  Some days, she will go back and forth to the bathroom passing small mushy bowel movements.  Feels bowel movements are incomplete.  Bowels were moving better when taking MiraLAX and fiber.  She also generally notices flare of upper GI symptoms  when she has more trouble with constipation.  Saturday, she felt worse and had an episode of vomiting.  No further vomiting since then, but continues with intermittent nausea, early satiety.  Also with intermittent indigestion about 4 days ago.  Denies dysphagia.  No blood in the stools. Stools are green on iron. No hematemesis. No dysphagia.   Down about 5 lbs since her last visit. Hasn't made any dietary changes. States she is a junk food addict. Eats a few bites all throughout the day due to early satiety.   Had been taking ibuprofen after she fell in February and hurt her right knee. Was talking 800 mg twice daily, then reduced to 400 mg nightly. None in 2 weeks.  Prior to this, she has been taking ibuprofen intermittently since August 2021 for left foot pain.   Past Medical History:  Diagnosis Date  . Adnexal mass    Left; followed by Dr. [name]  . Anemia, iron deficiency   . Arthritis   . At risk for cardiovascular event 06/17/2016  . Chronic low back pain   . Diabetes mellitus, type 2 ([place])   . Dyspnea 09/19/2018  . Essential hypertension   . Gastroparesis   . GERD (gastroesophageal reflux disease)   . MS (multiple sclerosis) ([place])   . Nonalcoholic fatty liver disease   . Obesity   . Obesity, diabetes, and hypertension syndrome ([place]) 03/12/2009   Qualifier: Diagnosis of  By: Moshe Cipro MD, Joycelyn Schmid    . Sleep apnea 2009  . Vaginitis 03/29/2018    Past Surgical History:  Procedure Laterality Date  .  AGILE CAPSULE N/A 08/11/2014   Procedure: AGILE CAPSULE;  Surgeon: Daneil Dolin, MD;  Location: [facility];  Service: Endoscopy;  Laterality: N/A;  0700  . BLADDER SUSPENSION    . CESAREAN SECTION     x2   . CHOLECYSTECTOMY    . COLONOSCOPY  2010   Dr. [name]: tubular adenoma, few scattered diverticula  . COLONOSCOPY N/A 10/01/2013   Dr. Pixie Casino preparation. Normal rectum. Normal colonic mucosa  . COLONOSCOPY N/A 11/12/2018   sigmoid and descending colon diverticulosis. Two  4-5 mm polyps in ascending colon. (tubular adenomas). Surveillance in 5 years  . DILATION AND CURETTAGE OF UTERUS     4  . ESOPHAGOGASTRODUODENOSCOPY  2010   Dr. [name]: normal esophagus, small hiatal hernia, questionable pale duodenal mucosa but negative for celiac sprue.   . ESOPHAGOGASTRODUODENOSCOPY N/A 10/01/2013   Dr. Rourk:gastric polyps-status post biopsy. Otherwise, normal EGD. fundic gland polyp, negative H.pylori  . GIVENS CAPSULE STUDY N/A 09/08/2014   Poor prep but overall unremarkable   . KNEE ARTHROSCOPY W/ DEBRIDEMENT    . LAPAROSCOPY ABDOMEN DIAGNOSTIC     lysis of adhesions  . POLYPECTOMY  11/12/2018   Procedure: POLYPECTOMY;  Surgeon: Daneil Dolin, MD;  Location: [facility];  Service: Endoscopy;;  . ROTATOR CUFF REPAIR    . TENDON LENGTHENING     Left wrist  . UMBILICAL HERNIA REPAIR      Current Outpatient Medications  Medication Sig Dispense Refill  . allopurinol (ZYLOPRIM) 300 MG tablet Take 1 tablet (300 mg total) by mouth daily. 90 tablet 2  . aspirin EC 81 MG tablet Take 81 mg by mouth daily.    Marland Kitchen azelastine (ASTELIN) 0.1 % nasal spray Place 2 sprays into both nostrils at bedtime. Use in each nostril as directed 30 mL 3  . Blood Glucose Monitoring Suppl (ONE TOUCH ULTRA 2) w/Device KIT USE TO TEST ONCE DAILY 1 kit 0  . cetirizine (ZYRTEC) 10 MG tablet Take 10 mg by mouth daily.    . Cholecalciferol (DIALYVITE VITAMIN D 5000) 125 MCG (5000 UT) capsule Take 5,000 Units by mouth 3 (three) times a week.    . diphenhydrAMINE (BENADRYL) 25 MG tablet Take 25 mg by mouth every 6 (six) hours as needed for itching.    . empagliflozin (JARDIANCE) 25 MG TABS tablet Take 1 tablet (25 mg total) by mouth daily before breakfast. 30 tablet 5  . Ferrous Sulfate (IRON) 325 (65 Fe) MG TABS Take 1 tablet by mouth daily.    Marland Kitchen FIBER PO Take by mouth as needed.    . fluticasone (FLONASE) 50 MCG/ACT nasal spray Place 2 sprays into both nostrils daily.     Marland Kitchen glipiZIDE (GLUCOTROL) 10  MG tablet TAKE 1 TABLET BY MOUTH  TWICE DAILY BEFORE MEALS 180 tablet 3  . metFORMIN (GLUCOPHAGE) 1000 MG tablet Take 1 tablet (1,000 mg total) by mouth 2 (two) times daily with a meal. 180 tablet 3  . montelukast (SINGULAIR) 10 MG tablet TAKE 1 TABLET BY MOUTH AT  BEDTIME 90 tablet 3  . NIFEdipine (ADALAT CC) 90 MG 24 hr tablet TAKE 1 TABLET BY MOUTH  DAILY 90 tablet 3  . Olopatadine HCl 0.2 % SOLN Apply 1 drop to eye daily.    Glory Rosebush Delica Lancets 32K MISC USE UP TO 4 TIMES DAILY AS  DIRECTED 400 each 1  . ONETOUCH ULTRA test strip USE TO TEST ONCE DAILY 100 strip 3  . OVER THE COUNTER MEDICATION  Apply 1 application topically daily as needed (pain). horse liniment cream    . pantoprazole (PROTONIX) 40 MG tablet Take 1 tablet (40 mg total) by mouth daily before breakfast. 90 tablet 3  . rosuvastatin (CRESTOR) 5 MG tablet Take one tablet by mouth every Monday and Friday 32 tablet 3  . spironolactone (ALDACTONE) 50 MG tablet TAKE 1 TABLET BY MOUTH EVERY DAY 30 tablet 3  . triamcinolone cream (KENALOG) 0.1 % Apply 1 application topically daily as needed (rash).     No current facility-administered medications for this visit.    Allergies as of 08/04/2020 - Review Complete 08/04/2020  Allergen Reaction Noted  . Gabapentin Shortness Of Breath 02/13/2017  . Ace inhibitors Cough 03/26/2012  . Aspirin    . Oxycodone Hives 04/01/2018  . Penicillins Hives   . Rebif [interferon beta-1a] Other (See Comments) 05/01/2018  . Statins  10/11/2012    Family History  Problem Relation Age of Onset  . Hypertension Sister   . Diabetes Sister   . Heart disease Sister   . Kidney failure Sister   . Heart failure Mother   . Hypertension Mother   . Arthritis Mother   . Stroke Father   . Hypertension Son   . Hypertension Sister   . Hypertension Brother   . Mental illness Brother   . Colon cancer Maternal Aunt     Social History   Socioeconomic History  . Marital status: Married    Spouse  name: [name]   . Number of children: 4  . Years of education: 70  . Highest education level: Not on file  Occupational History  . Occupation: unemployed     Fish farm manager: UNEMPLOYED  Tobacco Use  . Smoking status: Never Smoker  . Smokeless tobacco: Never Used  . Tobacco comment: patient lives with a smoker   Vaping Use  . Vaping Use: Never used  Substance and Sexual Activity  . Alcohol use: No  . Drug use: No  . Sexual activity: Yes    Birth control/protection: Post-menopausal  Other Topics Concern  . Not on file  Social History Narrative   Patient lives at home with husband [name].    Patient has 4 children.    Patient has a some college.    Patient is right handed.    Patient is currently not working.    Social Determinants of Health   Financial Resource Strain: Low Risk   . Difficulty of Paying Living Expenses: Not very hard  Food Insecurity: No Food Insecurity  . Worried About Charity fundraiser in the Last Year: Never true  . Ran Out of Food in the Last Year: Never true  Transportation Needs: No Transportation Needs  . Lack of Transportation (Medical): No  . Lack of Transportation (Non-Medical): No  Physical Activity: Insufficiently Active  . Days of Exercise per Week: 1 day  . Minutes of Exercise per Session: 20 min  Stress: No Stress Concern Present  . Feeling of Stress : Only a little  Social Connections: Moderately Isolated  . Frequency of Communication with Friends and Family: More than three times a week  . Frequency of Social Gatherings with Friends and Family: More than three times a week  . Attends Religious Services: Never  . Active Member of Clubs or Organizations: No  . Attends Archivist Meetings: Never  . Marital Status: Married    Review of Systems: Gen: Denies fever, chills, cold or flulike symptoms, presyncope, syncope. CV: Denies chest  pain or palpitations. Resp: Denies dyspnea or cough. GI: See HPI Heme: See HPI  Physical Exam: BP  (!) 150/77   Pulse 66   Temp (!) 96.9 F (36.1 C)   Ht 5' 1"  (1.549 m)   Wt 175 lb 3.2 oz (79.5 kg)   LMP 08/18/2012   BMI 33.10 kg/m  General:   Alert and oriented. No distress noted. Pleasant and cooperative.  Head:  Normocephalic and atraumatic. Eyes:  Conjuctiva clear without scleral icterus. Heart:  S1, S2 present without murmurs appreciated. Lungs:  Clear to auscultation bilaterally. No wheezes, rales, or rhonchi. No distress.  Abdomen:  +BS, soft, and non-distended. Mild ttp in the suprapubic area at her prior C-section scars which she reports is chronic. No rebound or guarding. No HSM or masses noted. Msk:  Symmetrical without gross deformities. Normal posture. Extremities:  Without edema. Neurologic:  Alert and  oriented x4 Psych:  Normal mood and affect.   Assessment: 65 year old female with history of constipation, GERD, gastroparesis, and IDA extensively evaluated in 2015/2016 with hemoglobin maintaining stable in the 12 range who is presenting today with chief complaint of constipation, nausea, vomiting, early satiety, and indigestion.  Constipation: Previously well managed with MiraLAX and fiber, but patient has not been taking these routinely.  Currently with small incomplete bowel movements daily.  Some days, she goes back and forth passing small mushy stools.  Suspect she may have a component of overflow diarrhea.  No alarm symptoms.  Colonoscopy up-to-date in 2020, due for surveillance in 2025. Advised to resume MiraLAX 17g daily and fiber daily.  Early satiety with nausea/vomiting: Increased early satiety with associated nausea x1 month with single episode of vomiting on Saturday.  Patient has history of gastroparesis with abnormal gastric emptying study in 2016, but has never required any medications to treat this specifically.  Notably, she is also struggling with constipation which has previously worsened her upper GI symptoms.  Additionally, acid reflux is not  adequately controlled on Protonix daily.  She had also been taking ibuprofen daily following a fall in February though she discontinued this 2 weeks ago.  Considering this, cannot rule out esophagitis, gastritis, duodenitis, PUD.  She denies dysphagia, BRBPR, melena, hematemesis.  Discussed pursuing EGD for further evaluation of upper GI symptoms, but patient prefers to focus on medical management for at least 1 month.  We will increase Protonix to 40 mg twice daily and she was advised to resume MiraLAX and fiber daily.  Advised to continue to avoid NSAIDs and counseled on gastroparesis diet. If no improvement in upper GI symptoms, will pursue EGD. If EGD is unrevealing and symptoms persist, will need to consider starting Reglan for gastroparesis.   GERD: Not adequately controlled on Protonix 40 mg daily.  Will increase Protonix to twice daily.   Plan:  1.  Increase Protonix to 40 mg twice daily 30 minutes before breakfast and dinner. 2.  Start MiraLAX 1 capful (17 g) daily in 8 ounces water. 3.  Resume fiber daily. 4.  Counseled on gastroparesis diet.  Written instructions and separate handout provided. 5.  Avoid NSAIDs.  6.  Requested progress report in 4 weeks.  If no improvement in upper GI symptoms, we will proceed with EGD for further evaluation.  If EGD is unrevealing and symptoms persist, consider starting low-dose Reglan for gastroparesis. 7.  Follow-up in 3 months.    Aliene Altes, PA-C [facility] 08/04/2020

## 2020-08-04 ENCOUNTER — Other Ambulatory Visit: Payer: Self-pay

## 2020-08-04 ENCOUNTER — Encounter: Payer: Self-pay | Admitting: Gastroenterology

## 2020-08-04 ENCOUNTER — Ambulatory Visit: Payer: Medicare Other | Admitting: Gastroenterology

## 2020-08-04 VITALS — BP 150/77 | HR 66 | Temp 96.9°F | Ht 61.0 in | Wt 175.2 lb

## 2020-08-04 DIAGNOSIS — K5900 Constipation, unspecified: Secondary | ICD-10-CM | POA: Diagnosis not present

## 2020-08-04 DIAGNOSIS — R6881 Early satiety: Secondary | ICD-10-CM | POA: Diagnosis not present

## 2020-08-04 DIAGNOSIS — R1110 Vomiting, unspecified: Secondary | ICD-10-CM | POA: Insufficient documentation

## 2020-08-04 DIAGNOSIS — K219 Gastro-esophageal reflux disease without esophagitis: Secondary | ICD-10-CM

## 2020-08-04 DIAGNOSIS — R112 Nausea with vomiting, unspecified: Secondary | ICD-10-CM | POA: Diagnosis not present

## 2020-08-04 NOTE — Patient Instructions (Signed)
Increase Protonix to 40 mg twice daily 30 minutes before breakfast and dinner.  Start MiraLAX 1 capful (17 g) daily in 8 ounces of water.  Resume fiber daily.  As we discussed, follow a low-fat and low fiber diet. Eat 4-6 small meals daily. Do not eat within 3 hours of going to bed.  See separate gastroparesis diet handout.   Please call in 4 weeks with a progress report.  If nausea, early satiety have not resolved/improved, will consider an upper endoscopy for further evaluation.   It was good to see you today! I'm sorry you have not been feeling well.   Aliene Altes, PA-C [facility]

## 2020-08-12 ENCOUNTER — Other Ambulatory Visit: Payer: Self-pay | Admitting: Family Medicine

## 2020-08-24 ENCOUNTER — Other Ambulatory Visit (HOSPITAL_COMMUNITY): Payer: Self-pay | Admitting: Family Medicine

## 2020-08-24 DIAGNOSIS — I10 Essential (primary) hypertension: Secondary | ICD-10-CM | POA: Diagnosis not present

## 2020-08-24 DIAGNOSIS — Z1231 Encounter for screening mammogram for malignant neoplasm of breast: Secondary | ICD-10-CM

## 2020-08-24 DIAGNOSIS — E1149 Type 2 diabetes mellitus with other diabetic neurological complication: Secondary | ICD-10-CM | POA: Diagnosis not present

## 2020-08-26 LAB — CBC
Hematocrit: 33.4 % — ABNORMAL LOW (ref 34.0–46.6)
Hemoglobin: 10.7 g/dL — ABNORMAL LOW (ref 11.1–15.9)
MCH: 27.8 pg (ref 26.6–33.0)
MCHC: 32 g/dL (ref 31.5–35.7)
MCV: 87 fL (ref 79–97)
Platelets: 406 10*3/uL (ref 150–450)
RBC: 3.85 x10E6/uL (ref 3.77–5.28)
RDW: 13.7 % (ref 11.7–15.4)
WBC: 8.2 10*3/uL (ref 3.4–10.8)

## 2020-08-26 LAB — MICROALBUMIN / CREATININE URINE RATIO
Creatinine, Urine: 164.7 mg/dL
Microalb/Creat Ratio: 172 mg/g creat — ABNORMAL HIGH (ref 0–29)
Microalbumin, Urine: 283.8 ug/mL

## 2020-08-26 LAB — CMP14+EGFR
ALT: 14 IU/L (ref 0–32)
AST: 14 IU/L (ref 0–40)
Albumin/Globulin Ratio: 1.7 (ref 1.2–2.2)
Albumin: 4.7 g/dL (ref 3.8–4.8)
Alkaline Phosphatase: 111 IU/L (ref 44–121)
BUN/Creatinine Ratio: 14 (ref 12–28)
BUN: 15 mg/dL (ref 8–27)
Bilirubin Total: 0.3 mg/dL (ref 0.0–1.2)
CO2: 21 mmol/L (ref 20–29)
Calcium: 9.8 mg/dL (ref 8.7–10.3)
Chloride: 104 mmol/L (ref 96–106)
Creatinine, Ser: 1.05 mg/dL — ABNORMAL HIGH (ref 0.57–1.00)
Globulin, Total: 2.8 g/dL (ref 1.5–4.5)
Glucose: 126 mg/dL — ABNORMAL HIGH (ref 65–99)
Potassium: 4.8 mmol/L (ref 3.5–5.2)
Sodium: 141 mmol/L (ref 134–144)
Total Protein: 7.5 g/dL (ref 6.0–8.5)
eGFR: 59 mL/min/{1.73_m2} — ABNORMAL LOW (ref >59)

## 2020-08-26 LAB — HEMOGLOBIN A1C
Est. average glucose Bld gHb Est-mCnc: 166 mg/dL
Hgb A1c MFr Bld: 7.4 % — ABNORMAL HIGH (ref 4.8–5.6)

## 2020-08-26 LAB — TSH: TSH: 2.68 u[IU]/mL (ref 0.450–4.500)

## 2020-08-31 ENCOUNTER — Other Ambulatory Visit: Payer: Self-pay

## 2020-08-31 ENCOUNTER — Ambulatory Visit (INDEPENDENT_AMBULATORY_CARE_PROVIDER_SITE_OTHER): Payer: Medicare Other | Admitting: Family Medicine

## 2020-08-31 ENCOUNTER — Encounter: Payer: Self-pay | Admitting: Family Medicine

## 2020-08-31 VITALS — BP 150/80 | HR 80 | Resp 15 | Ht 61.0 in | Wt 178.8 lb

## 2020-08-31 DIAGNOSIS — I272 Pulmonary hypertension, unspecified: Secondary | ICD-10-CM | POA: Diagnosis not present

## 2020-08-31 DIAGNOSIS — M25561 Pain in right knee: Secondary | ICD-10-CM

## 2020-08-31 DIAGNOSIS — E785 Hyperlipidemia, unspecified: Secondary | ICD-10-CM | POA: Diagnosis not present

## 2020-08-31 DIAGNOSIS — I10 Essential (primary) hypertension: Secondary | ICD-10-CM | POA: Diagnosis not present

## 2020-08-31 DIAGNOSIS — G8929 Other chronic pain: Secondary | ICD-10-CM | POA: Diagnosis not present

## 2020-08-31 DIAGNOSIS — E1149 Type 2 diabetes mellitus with other diabetic neurological complication: Secondary | ICD-10-CM | POA: Diagnosis not present

## 2020-08-31 DIAGNOSIS — J309 Allergic rhinitis, unspecified: Secondary | ICD-10-CM

## 2020-08-31 DIAGNOSIS — M25562 Pain in left knee: Secondary | ICD-10-CM

## 2020-08-31 DIAGNOSIS — E669 Obesity, unspecified: Secondary | ICD-10-CM

## 2020-08-31 MED ORDER — AZELASTINE HCL 0.1 % NA SOLN: 2.0000 | Freq: Two times a day (BID) | NASAL | 12 refills | Status: DC

## 2020-08-31 NOTE — Assessment & Plan Note (Signed)
Improved, no med change Michaela Peterson is reminded of the importance of commitment to daily physical activity for 30 minutes or more, as able and the need to limit carbohydrate intake to 30 to 60 grams per meal to help with blood sugar control.   The need to take medication as prescribed, test blood sugar as directed, and to call between visits if there is a concern that blood sugar is uncontrolled is also discussed.   Michaela Peterson is reminded of the importance of daily foot exam, annual eye examination, and good blood sugar, blood pressure and cholesterol control.  Diabetic Labs Latest Ref Rng & Units 08/24/2020 05/16/2020 03/25/2020 02/16/2020 10/29/2019  HbA1c 4.8 - 5.6 % 7.4(H) 7.9(H) - 8.4(H) 8.9(H)  Microalbumin mg/dL - - - - -  Micro/Creat Ratio 0 - 29 mg/g creat 172(H) - - - -  Chol 100 - 199 mg/dL - - 157 - -  HDL >39 mg/dL - - 57 - -  Calc LDL 0 - 99 mg/dL - - 88 - -  Triglycerides 0 - 149 mg/dL - - 60 - -  Creatinine 0.57 - 1.00 mg/dL 1.05(H) - 1.06(H) 1.13(H) 0.91   BP/Weight 08/31/2020 08/04/2020 04/06/2020 02/18/2020 01/05/2020 12/25/2019 29/51/8841  Systolic BP 660 630 160 109 323 - -  Diastolic BP 80 77 85 78 85 - -  Wt. (Lbs) 178.8 175.2 180.8 180.12 180 180.6 182  BMI 33.78 33.1 34.16 34.03 34.01 34.12 34.39   Foot/eye exam completion dates Latest Ref Rng & Units 08/31/2020 04/06/2020  Eye Exam No Retinopathy - No Retinopathy  Foot Form Completion - Done -

## 2020-08-31 NOTE — Patient Instructions (Addendum)
Annual exam in office wih MD , mid to end October, call if you need me sooner  CONGRATS on improved blood sugar, keep it up  Non fasting HBA1C, chem 7 and EGFR and uric acid level 5 days before next appt in October  Please get your covid booster, this is past due  VERY important you take your medications EVERY day as prescribed, your BP is high  Please call for Ortho referral for right knee when you decide where you want to go  Astellin is prescribed for allergies which will help headache  Sorry about family illnesses  It is important that you exercise regularly at least 30 minutes 5 times a week. If you develop chest pain, have severe difficulty breathing, or feel very tired, stop exercising immediately and seek medical attention  Thanks for choosing [facility], we consider it a privelige to serve you.

## 2020-08-31 NOTE — Assessment & Plan Note (Addendum)
4 monht h/o pain s/p fall rated at an 8 at end  Of the day, needs Ortho eval, pt to call with referral info

## 2020-09-01 LAB — SPECIMEN STATUS REPORT

## 2020-09-02 ENCOUNTER — Encounter: Payer: Self-pay | Admitting: Family Medicine

## 2020-09-02 ENCOUNTER — Telehealth: Payer: Self-pay

## 2020-09-02 NOTE — Progress Notes (Signed)
Michaela Peterson     MRN: [id number]      DOB: 01/09/1956   HPI [name] is here for follow up and re-evaluation of chronic medical conditions, medication management and review of any available recent lab and radiology data.  Preventive health is updated, specifically  Cancer screening and Immunization.   Questions or concerns regarding consultations or procedures which the PT has had in the interim are  addressed. The PT denies any adverse reactions to current medications since the last visit.  There are no new concerns.  There are no specific complaints   ROS Denies recent fever or chills. Denies sinus pressure, nasal congestion, ear pain or sore throat. Denies chest congestion, productive cough or wheezing. Denies chest pains, palpitations and leg swelling Denies abdominal pain, nausea, vomiting,diarrhea or constipation.   Denies dysuria, frequency, hesitancy or incontinence. Denies joint pain, swelling and limitation in mobility. Denies headaches, seizures, numbness, or tingling. Denies depression, anxiety or insomnia. Denies skin break down or rash.   PE  BP (!) 150/80   Pulse 80   Resp 15   Ht 5\' 1"  (1.549 m)   Wt 178 lb 12.8 oz (81.1 kg)   LMP 08/18/2012   SpO2 96%   BMI 33.78 kg/m   Patient alert and oriented and in no cardiopulmonary distress.  HEENT: No facial asymmetry, EOMI,     Neck supple .Ethmoid sinus tenderness  Chest: Clear to auscultation bilaterally.  CVS: S1, S2 no murmurs, no S3.Regular rate.  ABD: Soft non tender.   Ext: No edema Right knees.  Skin: Intact, no ulcerations or rash noted.  Psych: Good eye contact, normal affect. Memory intact not anxious or depressed appearing.  CNS: CN 2-12 intact, power,  normal throughout.no focal deficits noted.   Assessment & Plan  Right knee pain 4 monht h/o pain s/p fall rated at an 8 at end  Of the day, needs Ortho eval, pt to call with referral info  Type 2 diabetes mellitus with neurological  complications ([facility]) Improved, no med change Michaela Peterson is reminded of the importance of commitment to daily physical activity for 30 minutes or more, as able and the need to limit carbohydrate intake to 30 to 60 grams per meal to help with blood sugar control.   The need to take medication as prescribed, test blood sugar as directed, and to call between visits if there is a concern that blood sugar is uncontrolled is also discussed.   Michaela Peterson is reminded of the importance of daily foot exam, annual eye examination, and good blood sugar, blood pressure and cholesterol control.  Diabetic Labs Latest Ref Rng & Units 08/24/2020 05/16/2020 03/25/2020 02/16/2020 10/29/2019  HbA1c 4.8 - 5.6 % 7.4(H) 7.9(H) - 8.4(H) 8.9(H)  Microalbumin mg/dL - - - - -  Micro/Creat Ratio 0 - 29 mg/g creat 172(H) - - - -  Chol 100 - 199 mg/dL - - 157 - -  HDL >39 mg/dL - - 57 - -  Calc LDL 0 - 99 mg/dL - - 88 - -  Triglycerides 0 - 149 mg/dL - - 60 - -  Creatinine 0.57 - 1.00 mg/dL 1.05(H) - 1.06(H) 1.13(H) 0.91   BP/Weight 08/31/2020 08/04/2020 04/06/2020 02/18/2020 01/05/2020 12/25/2019 01/00/7121  Systolic BP 975 883 254 982 641 - -  Diastolic BP 80 77 85 78 85 - -  Wt. (Lbs) 178.8 175.2 180.8 180.12 180 180.6 182  BMI 33.78 33.1 34.16 34.03 34.01 34.12 34.39   Foot/eye  exam completion dates Latest Ref Rng & Units 08/31/2020 04/06/2020  Eye Exam No Retinopathy - No Retinopathy  Foot Form Completion - Done -        Allergic rhinitis Uncontrolled with intermitent headaches, encouraged daily use of medication ad Astelin added  Hypertension goal BP (blood pressure) < 140/90 Non compliant reports missing doses,  Needs to change this DASH diet and commitment to daily physical activity for a minimum of 30 minutes discussed and encouraged, as a part of hypertension management. The importance of attaining a healthy weight is also discussed.  BP/Weight 08/31/2020 08/04/2020 04/06/2020 02/18/2020 01/05/2020 12/25/2019  28/00/3491  Systolic BP 791 505 697 948 016 - -  Diastolic BP 80 77 85 78 85 - -  Wt. (Lbs) 178.8 175.2 180.8 180.12 180 180.6 182  BMI 33.78 33.1 34.16 34.03 34.01 34.12 34.39       Hyperlipidemia LDL goal <100 Hyperlipidemia:Low fat diet discussed and encouraged.   Lipid Panel  Lab Results  Component Value Date   CHOL 157 03/25/2020   HDL 57 03/25/2020   LDLCALC 88 03/25/2020   TRIG 60 03/25/2020   CHOLHDL 2.8 03/25/2020   Controlled, no change in medication Updated lab needed at/ before next visit.     Obesity (BMI 30.0-34.9)  Patient re-educated about  the importance of commitment to a  minimum of 150 minutes of exercise per week as able.  The importance of healthy food choices with portion control discussed, as well as eating regularly and within a 12 hour window most days. The need to choose "clean , green" food 50 to 75% of the time is discussed, as well as to make water the primary drink and set a goal of 64 ounces water daily.    Weight /BMI 08/31/2020 08/04/2020 04/06/2020  WEIGHT 178 lb 12.8 oz 175 lb 3.2 oz 180 lb 12.8 oz  HEIGHT 5\' 1"  5\' 1"  5\' 1"   BMI 33.78 kg/m2 33.1 kg/m2 34.16 kg/m2      Knee pain, bilateral Right worse than left and recent trauma, will call for Ortho referral once she decides on this

## 2020-09-02 NOTE — Telephone Encounter (Signed)
Patient called to let Dr. Moshe Cipro know she had her booster shot 05.19.2022.

## 2020-09-02 NOTE — Telephone Encounter (Signed)
Documented

## 2020-09-02 NOTE — Assessment & Plan Note (Signed)
  Patient re-educated about  the importance of commitment to a  minimum of 150 minutes of exercise per week as able.  The importance of healthy food choices with portion control discussed, as well as eating regularly and within a 12 hour window most days. The need to choose "clean , green" food 50 to 75% of the time is discussed, as well as to make water the primary drink and set a goal of 64 ounces water daily.    Weight /BMI 08/31/2020 08/04/2020 04/06/2020  WEIGHT 178 lb 12.8 oz 175 lb 3.2 oz 180 lb 12.8 oz  HEIGHT 5\' 1"  5\' 1"  5\' 1"   BMI 33.78 kg/m2 33.1 kg/m2 34.16 kg/m2

## 2020-09-02 NOTE — Assessment & Plan Note (Addendum)
Uncontrolled with intermitent headaches, encouraged daily use of medication ad Astelin added

## 2020-09-02 NOTE — Assessment & Plan Note (Signed)
Right worse than left and recent trauma, will call for Ortho referral once she decides on this

## 2020-09-02 NOTE — Assessment & Plan Note (Signed)
Non compliant reports missing doses,  Needs to change this DASH diet and commitment to daily physical activity for a minimum of 30 minutes discussed and encouraged, as a part of hypertension management. The importance of attaining a healthy weight is also discussed.  BP/Weight 08/31/2020 08/04/2020 04/06/2020 02/18/2020 01/05/2020 12/25/2019 18/34/3735  Systolic BP 789 784 784 128 208 - -  Diastolic BP 80 77 85 78 85 - -  Wt. (Lbs) 178.8 175.2 180.8 180.12 180 180.6 182  BMI 33.78 33.1 34.16 34.03 34.01 34.12 34.39

## 2020-09-02 NOTE — Assessment & Plan Note (Signed)
Hyperlipidemia:Low fat diet discussed and encouraged.   Lipid Panel  Lab Results  Component Value Date   CHOL 157 03/25/2020   HDL 57 03/25/2020   LDLCALC 88 03/25/2020   TRIG 60 03/25/2020   CHOLHDL 2.8 03/25/2020   Controlled, no change in medication Updated lab needed at/ before next visit.

## 2020-09-08 ENCOUNTER — Telehealth: Payer: Self-pay

## 2020-09-08 NOTE — Telephone Encounter (Signed)
[facility] verified that the most recent specimen was unavailable to add on Iron and Ferritin. Would you like me to re-order and have pt come in for re-draw?

## 2020-09-18 NOTE — Progress Notes (Signed)
HPI female never smoker followed for OSA/ insomnia, complicated by HBP, GERD, DM 2/gastroparesis, Multiple Sclerosis Home Sleep Test- 08/27/16-AHI 25.4/hour, desaturation to 80%, body weight 184.6 pounds ----------------------------------------------------------------------------------------   09/18/19- 65 year old female never smoker(+lives w smoker) followed for OSA/ insomnia, complicated by HBP, GERD, DM 2/gastroparesis, Multiple Sclerosis CPAP auto 5-12/ [employer] Download compliance 37%- recorded use starts 6/24, AHI 7/ hr Body weight today 185 lbs Had 2 Moderna Covax Wasn't using CPAP for a while because too hot with full face mask and she says husband doesn't like air conditioning. Discussed options and medical reasons for CPAP. Denies new health problems.   09/19/20- 65 year old female never smoker(+lives w smoker) followed for OSA/ insomnia, complicated by HBP, GERD, DM 2/gastroparesis, Multiple Sclerosis CPAP auto 5-12/ [employer] Download- Body weight today- 179.2 -----Can't tolerate full face mask has not been using C-Pap Never went for mask fitting as planned. Much family stress- several in nursing homes. Discussed whether to continue trying to treat for OSA. Agrees to update HST. Then consider mask fitting. Has no upper teeth, so no OAP.  ROS-see HPI   + = positive Constitutional:    weight loss, night sweats, fevers, chills, + fatigue, lassitude. HEENT:    headaches, difficulty swallowing, tooth/dental problems, sore throat,       sneezing, itching, ear ache, nasal congestion, post nasal drip, snoring CV:    chest pain, orthopnea, PND, swelling in lower extremities, anasarca,                                   dizziness, palpitations Resp:   +shortness of breath with exertion or at rest.                productive cough,   non-productive cough, coughing up of blood.              change in color of mucus.  wheezing.   Skin:    rash or lesions. GI:  No-   heartburn, indigestion,  abdominal pain, nausea, vomiting, diarrhea,                 change in bowel habits, loss of appetite GU: dysuria, change in color of urine, no urgency or frequency.   flank pain. MS:   joint pain, stiffness, decreased range of motion, back pain. Neuro-     nothing unusual Psych:  change in mood or affect.  depression or anxiety.   memory loss.  OBJ- Physical Exam General- Alert, Oriented, Affect-appropriate, Distress- none acute, + obese Skin- rash-none, lesions- none, excoriation- none Lymphadenopathy- none Head- atraumatic            Eyes- Gross vision intact, PERRLA, conjunctivae and secretions clear            Ears- Hearing, canals-normal            Nose- Clear, no-Septal dev, mucus, polyps, erosion, perforation             Throat- Mallampati IV , mucosa clear , drainage- none, tonsils- atrophic,  +no upper teeth Neck- flexible , trachea midline, no stridor , thyroid nl, carotid no bruit Chest - symmetrical excursion , unlabored           Heart/CV- RRR , no murmur , no gallop  , no rub, nl s1 s2                           -  JVD- none , edema- none, stasis changes- none, varices- none           Lung- clear to P&A, wheeze- none, cough- none , dullness-none, rub- none           Chest wall-  Abd-  Br/ Gen/ Rectal- Not done, not indicated Extrem- cyanosis- none, clubbing, none, atrophy- none, strength- nl Neuro- grossly intact to observation

## 2020-09-19 ENCOUNTER — Ambulatory Visit: Payer: Medicare Other | Admitting: Internal Medicine

## 2020-09-19 ENCOUNTER — Encounter: Payer: Self-pay | Admitting: Internal Medicine

## 2020-09-19 ENCOUNTER — Other Ambulatory Visit: Payer: Self-pay

## 2020-09-19 VITALS — BP 130/76 | HR 84 | Temp 98.2°F | Ht 61.0 in | Wt 179.2 lb

## 2020-09-19 DIAGNOSIS — G4733 Obstructive sleep apnea (adult) (pediatric): Secondary | ICD-10-CM

## 2020-09-19 NOTE — Patient Instructions (Signed)
Order- schedule home sleep test    dx OSA  Please call us for results of your sleep test about 2 weeks after it is done. We can decide then whether to get you a different type of CPAP mask or consider some other kind of treatment

## 2020-09-26 DIAGNOSIS — M79675 Pain in left toe(s): Secondary | ICD-10-CM | POA: Diagnosis not present

## 2020-09-26 DIAGNOSIS — L110 Acquired keratosis follicularis: Secondary | ICD-10-CM | POA: Diagnosis not present

## 2020-09-26 DIAGNOSIS — E1140 Type 2 diabetes mellitus with diabetic neuropathy, unspecified: Secondary | ICD-10-CM | POA: Diagnosis not present

## 2020-09-26 DIAGNOSIS — M79674 Pain in right toe(s): Secondary | ICD-10-CM | POA: Diagnosis not present

## 2020-09-26 DIAGNOSIS — I739 Peripheral vascular disease, unspecified: Secondary | ICD-10-CM | POA: Diagnosis not present

## 2020-09-26 DIAGNOSIS — M79672 Pain in left foot: Secondary | ICD-10-CM | POA: Diagnosis not present

## 2020-09-26 DIAGNOSIS — M79671 Pain in right foot: Secondary | ICD-10-CM | POA: Diagnosis not present

## 2020-10-05 ENCOUNTER — Ambulatory Visit (HOSPITAL_COMMUNITY)
Admission: RE | Admit: 2020-10-05 | Discharge: 2020-10-05 | Disposition: A | Discharge status: Home / Self Care | Payer: Medicare Other | Source: Ambulatory Visit | Attending: Family Medicine | Admitting: Family Medicine

## 2020-10-05 ENCOUNTER — Other Ambulatory Visit: Payer: Self-pay

## 2020-10-05 DIAGNOSIS — Z1231 Encounter for screening mammogram for malignant neoplasm of breast: Secondary | ICD-10-CM | POA: Diagnosis not present

## 2020-10-06 ENCOUNTER — Other Ambulatory Visit (HOSPITAL_COMMUNITY): Payer: Self-pay | Admitting: Family Medicine

## 2020-10-06 DIAGNOSIS — R928 Other abnormal and inconclusive findings on diagnostic imaging of breast: Secondary | ICD-10-CM

## 2020-10-10 ENCOUNTER — Encounter: Payer: Self-pay | Admitting: Internal Medicine

## 2020-10-11 ENCOUNTER — Ambulatory Visit (HOSPITAL_COMMUNITY)
Admission: RE | Admit: 2020-10-11 | Discharge: 2020-10-11 | Disposition: A | Discharge status: Home / Self Care | Payer: Medicare Other | Source: Ambulatory Visit | Attending: Family Medicine | Admitting: Family Medicine

## 2020-10-11 ENCOUNTER — Other Ambulatory Visit: Payer: Self-pay

## 2020-10-11 DIAGNOSIS — R928 Other abnormal and inconclusive findings on diagnostic imaging of breast: Secondary | ICD-10-CM | POA: Insufficient documentation

## 2020-10-11 DIAGNOSIS — R922 Inconclusive mammogram: Secondary | ICD-10-CM | POA: Diagnosis not present

## 2020-10-21 ENCOUNTER — Telehealth: Payer: Self-pay

## 2020-10-21 NOTE — Telephone Encounter (Signed)
Patient called shes states that [facility] now covers flonase and is requesting a rx be sent to them for this nasal spray ph# [phone]

## 2020-10-25 ENCOUNTER — Other Ambulatory Visit: Payer: Self-pay

## 2020-10-25 DIAGNOSIS — J309 Allergic rhinitis, unspecified: Secondary | ICD-10-CM

## 2020-10-25 MED ORDER — FLUTICASONE PROPIONATE 50 MCG/ACT NA SUSP: 2.0000 | Freq: Every day | NASAL | 1 refills | Status: DC

## 2020-10-25 NOTE — Telephone Encounter (Signed)
Rx sent to [facility]

## 2020-11-01 ENCOUNTER — Encounter: Payer: Self-pay | Admitting: Internal Medicine

## 2020-11-08 ENCOUNTER — Encounter: Payer: Self-pay | Admitting: Family Medicine

## 2020-11-08 ENCOUNTER — Ambulatory Visit (INDEPENDENT_AMBULATORY_CARE_PROVIDER_SITE_OTHER): Payer: Medicare Other | Admitting: Family Medicine

## 2020-11-08 ENCOUNTER — Other Ambulatory Visit: Payer: Self-pay

## 2020-11-08 VITALS — BP 145/77 | HR 78 | Temp 97.6°F | Resp 18 | Ht 61.0 in | Wt 179.1 lb

## 2020-11-08 DIAGNOSIS — E785 Hyperlipidemia, unspecified: Secondary | ICD-10-CM | POA: Diagnosis not present

## 2020-11-08 DIAGNOSIS — R109 Unspecified abdominal pain: Secondary | ICD-10-CM | POA: Insufficient documentation

## 2020-11-08 DIAGNOSIS — R1012 Left upper quadrant pain: Secondary | ICD-10-CM | POA: Diagnosis not present

## 2020-11-08 DIAGNOSIS — Z23 Encounter for immunization: Secondary | ICD-10-CM

## 2020-11-08 DIAGNOSIS — E1149 Type 2 diabetes mellitus with other diabetic neurological complication: Secondary | ICD-10-CM | POA: Diagnosis not present

## 2020-11-08 DIAGNOSIS — E669 Obesity, unspecified: Secondary | ICD-10-CM

## 2020-11-08 DIAGNOSIS — R1032 Left lower quadrant pain: Secondary | ICD-10-CM

## 2020-11-08 DIAGNOSIS — I10 Essential (primary) hypertension: Secondary | ICD-10-CM | POA: Diagnosis not present

## 2020-11-08 DIAGNOSIS — R1030 Lower abdominal pain, unspecified: Secondary | ICD-10-CM

## 2020-11-08 NOTE — Progress Notes (Signed)
Michaela Peterson     MRN: [id number]      DOB: April 05, 1955   HPI Michaela Peterson is here for  Left abdominal pain x 4 weeks , has had sharp intermittent pains that awaken, most recent was 3 to 4 days ago, no fever, no chills, no visible blood in stool, nausea which is not new, but no  vomiting Denies polyuria, polydipsia, blurred vision , or hypoglycemic episodes.   ROS Denies recent fever or chills. Denies sinus pressure, nasal congestion, ear pain or sore throat. Denies chest congestion, productive cough or wheezing. Denies chest pains, palpitations and leg swelling    Denies dysuria, frequency, hesitancy or incontinence. Denies joint pain, swelling and limitation in mobility. Denies headaches, seizures, numbness, or tingling. Denies depression, anxiety or insomnia. Denies skin break down or rash.   PE  BP (!) 145/77   Pulse 78   Temp 97.6 F (36.4 C)   Resp 18   Ht '5\' 1"'$  (1.549 m)   Wt 179 lb 1.9 oz (81.2 kg)   LMP 08/18/2012   SpO2 95%   BMI 33.84 kg/m   Patient alert and oriented and in no cardiopulmonary distress.  HEENT: No facial asymmetry, EOMI,     Neck supple .  Chest: Clear to auscultation bilaterally.  CVS: S1, S2 no murmurs, no S3.Regular rate.  ABD: Soft tender in RUQ and RLQ and right renal angle   Ext: No edema  MS: Adequate though reduced ROM spine, shoulders, hips and knees.  Skin: Intact, no ulcerations or rash noted.  Psych: Good eye contact, normal affect. Memory intact not anxious or depressed appearing.  CNS: CN 2-12 intact, power,  normal throughout.no focal deficits noted.   Assessment & Plan  Abdominal pain RUQ and RLQ pain x 4 weeks, needs scan , gI eval and labs  Hypertension goal BP (blood pressure) < 140/90 Uncontrolled, no med change, will monitor and follow at next visit DASH diet and commitment to daily physical activity for a minimum of 30 minutes discussed and encouraged, as a part of hypertension management. The importance  of attaining a healthy weight is also discussed.  BP/Weight 11/08/2020 09/19/2020 08/31/2020 08/04/2020 04/06/2020 02/18/2020 [id number]  Systolic BP Q000111Q [id number] Q000111Q Q000111Q [id number]  Diastolic BP 77 76 80 77 85 78 85  Wt. (Lbs) 179.12 179.2 178.8 175.2 180.8 180.12 180  BMI 33.84 33.86 33.78 33.1 34.16 34.03 34.01       Hyperlipidemia LDL goal <100 Hyperlipidemia:Low fat diet discussed and encouraged.   Lipid Panel  Lab Results  Component Value Date   CHOL 157 03/25/2020   HDL 57 03/25/2020   LDLCALC 88 03/25/2020   TRIG 60 03/25/2020   CHOLHDL 2.8 03/25/2020  Controlled, no change in medication Updated lab needed at/ before next visit.      Obesity (BMI 30.0-34.9)  Patient re-educated about  the importance of commitment to a  minimum of 150 minutes of exercise per week as able.  The importance of healthy food choices with portion control discussed, as well as eating regularly and within a 12 hour window most days. The need to choose "clean , green" food 50 to 75% of the time is discussed, as well as to make water the primary drink and set a goal of 64 ounces water daily.    Weight /BMI 11/08/2020 09/19/2020 08/31/2020  WEIGHT 179 lb 1.9 oz 179 lb 3.2 oz 178 lb 12.8 oz  HEIGHT '5\' 1"'$  '5\' 1"'$  '5\' 1"'$   BMI 33.84 kg/m2 33.86 kg/m2 33.78 kg/m2

## 2020-11-08 NOTE — Assessment & Plan Note (Signed)
  Patient re-educated about  the importance of commitment to a  minimum of 150 minutes of exercise per week as able.  The importance of healthy food choices with portion control discussed, as well as eating regularly and within a 12 hour window most days. The need to choose "clean , green" food 50 to 75% of the time is discussed, as well as to make water the primary drink and set a goal of 64 ounces water daily.    Weight /BMI 11/08/2020 09/19/2020 08/31/2020  WEIGHT 179 lb 1.9 oz 179 lb 3.2 oz 178 lb 12.8 oz  HEIGHT '5\' 1"'$  '5\' 1"'$  '5\' 1"'$   BMI 33.84 kg/m2 33.86 kg/m2 33.78 kg/m2

## 2020-11-08 NOTE — Assessment & Plan Note (Signed)
Uncontrolled, no med change, will monitor and follow at next visit DASH diet and commitment to daily physical activity for a minimum of 30 minutes discussed and encouraged, as a part of hypertension management. The importance of attaining a healthy weight is also discussed.  BP/Weight 11/08/2020 09/19/2020 08/31/2020 08/04/2020 04/06/2020 02/18/2020 [id number]  Systolic BP Q000111Q [id number] Q000111Q Q000111Q [id number] 0000000 [id number]  Diastolic BP 77 76 80 77 85 78 85  Wt. (Lbs) 179.12 179.2 178.8 175.2 180.8 180.12 180  BMI 33.84 33.86 33.78 33.1 34.16 34.03 34.01

## 2020-11-08 NOTE — Assessment & Plan Note (Signed)
RUQ and RLQ pain x 4 weeks, needs scan , gI eval and labs

## 2020-11-08 NOTE — Assessment & Plan Note (Signed)
Hyperlipidemia:Low fat diet discussed and encouraged.   Lipid Panel  Lab Results  Component Value Date   CHOL 157 03/25/2020   HDL 57 03/25/2020   LDLCALC 88 03/25/2020   TRIG 60 03/25/2020   CHOLHDL 2.8 03/25/2020  Controlled, no change in medication Updated lab needed at/ before next visit.

## 2020-11-08 NOTE — Patient Instructions (Addendum)
F/U as before, cal if you need me sooner  You are referred for scan of your abdomen and to GI re pain   Labs today, cmp and eGFR, lipase  Please get hBA1C sept 20 or after  Flu vaccine today  Thanks for choosing [facility], we consider it a privelige to serve you.

## 2020-11-09 LAB — CMP14+EGFR
ALT: 13 IU/L (ref 0–32)
AST: 14 IU/L (ref 0–40)
Albumin/Globulin Ratio: 1.5 (ref 1.2–2.2)
Albumin: 4.8 g/dL (ref 3.8–4.8)
Alkaline Phosphatase: 116 IU/L (ref 44–121)
BUN/Creatinine Ratio: 22 (ref 12–28)
BUN: 22 mg/dL (ref 8–27)
Bilirubin Total: 0.4 mg/dL (ref 0.0–1.2)
CO2: 22 mmol/L (ref 20–29)
Calcium: 10.7 mg/dL — ABNORMAL HIGH (ref 8.7–10.3)
Chloride: 103 mmol/L (ref 96–106)
Creatinine, Ser: 1.01 mg/dL — ABNORMAL HIGH (ref 0.57–1.00)
Globulin, Total: 3.1 g/dL (ref 1.5–4.5)
Glucose: 133 mg/dL — ABNORMAL HIGH (ref 65–99)
Potassium: 4.8 mmol/L (ref 3.5–5.2)
Sodium: 140 mmol/L (ref 134–144)
Total Protein: 7.9 g/dL (ref 6.0–8.5)
eGFR: 62 mL/min/{1.73_m2} (ref >59)

## 2020-11-09 LAB — LIPASE: Lipase: 56 U/L (ref 14–72)

## 2020-11-18 ENCOUNTER — Other Ambulatory Visit: Payer: Self-pay | Admitting: Family Medicine

## 2020-11-22 ENCOUNTER — Ambulatory Visit (HOSPITAL_COMMUNITY): Payer: Medicare Other

## 2020-11-27 ENCOUNTER — Other Ambulatory Visit: Payer: Self-pay | Admitting: Family Medicine

## 2020-12-12 ENCOUNTER — Other Ambulatory Visit: Payer: Self-pay

## 2020-12-12 ENCOUNTER — Ambulatory Visit: Payer: Medicare Other

## 2020-12-12 DIAGNOSIS — M79671 Pain in right foot: Secondary | ICD-10-CM | POA: Diagnosis not present

## 2020-12-12 DIAGNOSIS — M79672 Pain in left foot: Secondary | ICD-10-CM | POA: Diagnosis not present

## 2020-12-12 DIAGNOSIS — L110 Acquired keratosis follicularis: Secondary | ICD-10-CM | POA: Diagnosis not present

## 2020-12-12 DIAGNOSIS — M79674 Pain in right toe(s): Secondary | ICD-10-CM | POA: Diagnosis not present

## 2020-12-12 DIAGNOSIS — E1140 Type 2 diabetes mellitus with diabetic neuropathy, unspecified: Secondary | ICD-10-CM | POA: Diagnosis not present

## 2020-12-12 DIAGNOSIS — G4733 Obstructive sleep apnea (adult) (pediatric): Secondary | ICD-10-CM | POA: Diagnosis not present

## 2020-12-12 DIAGNOSIS — I739 Peripheral vascular disease, unspecified: Secondary | ICD-10-CM | POA: Diagnosis not present

## 2020-12-12 DIAGNOSIS — M79675 Pain in left toe(s): Secondary | ICD-10-CM | POA: Diagnosis not present

## 2020-12-16 ENCOUNTER — Other Ambulatory Visit: Payer: Self-pay

## 2020-12-16 ENCOUNTER — Ambulatory Visit (HOSPITAL_COMMUNITY)
Admission: RE | Admit: 2020-12-16 | Discharge: 2020-12-16 | Disposition: A | Discharge status: Home / Self Care | Payer: Medicare Other | Source: Ambulatory Visit | Attending: Family Medicine | Admitting: Family Medicine

## 2020-12-16 DIAGNOSIS — R1012 Left upper quadrant pain: Secondary | ICD-10-CM | POA: Insufficient documentation

## 2020-12-16 DIAGNOSIS — R1032 Left lower quadrant pain: Secondary | ICD-10-CM | POA: Diagnosis not present

## 2020-12-16 DIAGNOSIS — R109 Unspecified abdominal pain: Secondary | ICD-10-CM | POA: Diagnosis not present

## 2020-12-16 LAB — POCT I-STAT CREATININE: Creatinine, Ser: 1 mg/dL (ref 0.44–1.00)

## 2020-12-16 MED ORDER — IOHEXOL 300 MG/ML  SOLN
100.0000 mL | Freq: Once | INTRAMUSCULAR | Status: AC | PRN
  Administered 2020-12-16: 100 mL via INTRAVENOUS

## 2020-12-21 ENCOUNTER — Ambulatory Visit (INDEPENDENT_AMBULATORY_CARE_PROVIDER_SITE_OTHER): Payer: Medicare Other

## 2020-12-21 ENCOUNTER — Other Ambulatory Visit: Payer: Self-pay

## 2020-12-21 VITALS — Ht 61.0 in | Wt 179.0 lb

## 2020-12-21 DIAGNOSIS — Z0000 Encounter for general adult medical examination without abnormal findings: Secondary | ICD-10-CM

## 2020-12-21 NOTE — Patient Instructions (Signed)
Michaela Peterson , Thank you for taking time to come for your Medicare Wellness Visit. I appreciate your ongoing commitment to your health goals. Please review the following plan we discussed and let me know if I can assist you in the future.   Screening recommendations/referrals: Colonoscopy: Done 11/12/2018 Repeat in 10 years  Mammogram: Done 10/11/2020 Repeat annually  Bone Density: Due, call office to schedule at your convience. Recommended yearly ophthalmology/optometry visit for glaucoma screening and checkup Recommended yearly dental visit for hygiene and checkup  Vaccinations: Influenza vaccine: Done 11/08/2020 Pneumococcal vaccine: Done 08/30/2010 and 10/19/2013 Tdap vaccine: Done 12/23/2013 Shingles vaccine: Shingrix discussed. Please contact your pharmacy for coverage information.     Covid-19:[facility] 05/20/2019, 06/17/2019, 01/26/2020, 07/28/2020  Advanced directives: Advance directive discussed with you today. I have provided a copy for you to complete at home and have notarized. Once this is complete please bring a copy in to our office so we can scan it into your chart.   Conditions/risks identified: Aim for 30 minutes of exercise or walking each day, drink 6-8 glasses of water and eat lots of fruits and vegetables.   Next appointment: Follow up in one year for your annual wellness visit 2023   Preventive Care 65 Years and Older, Female Preventive care refers to lifestyle choices and visits with your health care provider that can promote health and wellness. What does preventive care include? A yearly physical exam. This is also called an annual well check. Dental exams once or twice a year. Routine eye exams. Ask your health care provider how often you should have your eyes checked. Personal lifestyle choices, including: Daily care of your teeth and gums. Regular physical activity. Eating a healthy diet. Avoiding tobacco and drug use. Limiting alcohol use. Practicing safe  sex. Taking low-dose aspirin every day. Taking vitamin and mineral supplements as recommended by your health care provider. What happens during an annual well check? The services and screenings done by your health care provider during your annual well check will depend on your age, overall health, lifestyle risk factors, and family history of disease. Counseling  Your health care provider may ask you questions about your: Alcohol use. Tobacco use. Drug use. Emotional well-being. Home and relationship well-being. Sexual activity. Eating habits. History of falls. Memory and ability to understand (cognition). Work and work Statistician. Reproductive health. Screening  You may have the following tests or measurements: Height, weight, and BMI. Blood pressure. Lipid and cholesterol levels. These may be checked every 5 years, or more frequently if you are over 55 years old. Skin check. Lung cancer screening. You may have this screening every year starting at age 40 if you have a 30-pack-year history of smoking and currently smoke or have quit within the past 15 years. Fecal occult blood test (FOBT) of the stool. You may have this test every year starting at age 58. Flexible sigmoidoscopy or colonoscopy. You may have a sigmoidoscopy every 5 years or a colonoscopy every 10 years starting at age 68. Hepatitis C blood test. Hepatitis B blood test. Sexually transmitted disease (STD) testing. Diabetes screening. This is done by checking your blood sugar (glucose) after you have not eaten for a while (fasting). You may have this done every 1-3 years. Bone density scan. This is done to screen for osteoporosis. You may have this done starting at age 47. Mammogram. This may be done every 1-2 years. Talk to your health care provider about how often you should have regular mammograms. Talk with  your health care provider about your test results, treatment options, and if necessary, the need for more  tests. Vaccines  Your health care provider may recommend certain vaccines, such as: Influenza vaccine. This is recommended every year. Tetanus, diphtheria, and acellular pertussis (Tdap, Td) vaccine. You may need a Td booster every 10 years. Zoster vaccine. You may need this after age 92. Pneumococcal 13-valent conjugate (PCV13) vaccine. One dose is recommended after age 89. Pneumococcal polysaccharide (PPSV23) vaccine. One dose is recommended after age 8. Talk to your health care provider about which screenings and vaccines you need and how often you need them. This information is not intended to replace advice given to you by your health care provider. Make sure you discuss any questions you have with your health care provider. Document Released: 03/25/2015 Document Revised: 11/16/2015 Document Reviewed: 12/28/2014 Elsevier Interactive Patient Education  2017 [place] Prevention in the Home Falls can cause injuries. They can happen to people of all ages. There are many things you can do to make your home safe and to help prevent falls. What can I do on the outside of my home? Regularly fix the edges of walkways and driveways and fix any cracks. Remove anything that might make you trip as you walk through a door, such as a raised step or threshold. Trim any bushes or trees on the path to your home. Use bright outdoor lighting. Clear any walking paths of anything that might make someone trip, such as rocks or tools. Regularly check to see if handrails are loose or broken. Make sure that both sides of any steps have handrails. Any raised decks and porches should have guardrails on the edges. Have any leaves, snow, or ice cleared regularly. Use sand or salt on walking paths during winter. Clean up any spills in your garage right away. This includes oil or grease spills. What can I do in the bathroom? Use night lights. Install grab bars by the toilet and in the tub and shower.  Do not use towel bars as grab bars. Use non-skid mats or decals in the tub or shower. If you need to sit down in the shower, use a plastic, non-slip stool. Keep the floor dry. Clean up any water that spills on the floor as soon as it happens. Remove soap buildup in the tub or shower regularly. Attach bath mats securely with double-sided non-slip rug tape. Do not have throw rugs and other things on the floor that can make you trip. What can I do in the bedroom? Use night lights. Make sure that you have a light by your bed that is easy to reach. Do not use any sheets or blankets that are too big for your bed. They should not hang down onto the floor. Have a firm chair that has side arms. You can use this for support while you get dressed. Do not have throw rugs and other things on the floor that can make you trip. What can I do in the kitchen? Clean up any spills right away. Avoid walking on wet floors. Keep items that you use a lot in easy-to-reach places. If you need to reach something above you, use a strong step stool that has a grab bar. Keep electrical cords out of the way. Do not use floor polish or wax that makes floors slippery. If you must use wax, use non-skid floor wax. Do not have throw rugs and other things on the floor that can make you trip.  What can I do with my stairs? Do not leave any items on the stairs. Make sure that there are handrails on both sides of the stairs and use them. Fix handrails that are broken or loose. Make sure that handrails are as long as the stairways. Check any carpeting to make sure that it is firmly attached to the stairs. Fix any carpet that is loose or worn. Avoid having throw rugs at the top or bottom of the stairs. If you do have throw rugs, attach them to the floor with carpet tape. Make sure that you have a light switch at the top of the stairs and the bottom of the stairs. If you do not have them, ask someone to add them for you. What else  can I do to help prevent falls? Wear shoes that: Do not have high heels. Have rubber bottoms. Are comfortable and fit you well. Are closed at the toe. Do not wear sandals. If you use a stepladder: Make sure that it is fully opened. Do not climb a closed stepladder. Make sure that both sides of the stepladder are locked into place. Ask someone to hold it for you, if possible. Clearly mark and make sure that you can see: Any grab bars or handrails. First and last steps. Where the edge of each step is. Use tools that help you move around (mobility aids) if they are needed. These include: Canes. Walkers. Scooters. Crutches. Turn on the lights when you go into a dark area. Replace any light bulbs as soon as they burn out. Set up your furniture so you have a clear path. Avoid moving your furniture around. If any of your floors are uneven, fix them. If there are any pets around you, be aware of where they are. Review your medicines with your doctor. Some medicines can make you feel dizzy. This can increase your chance of falling. Ask your doctor what other things that you can do to help prevent falls. This information is not intended to replace advice given to you by your health care provider. Make sure you discuss any questions you have with your health care provider. Document Released: 12/23/2008 Document Revised: 08/04/2015 Document Reviewed: 04/02/2014 Elsevier Interactive Patient Education  2017 [employer].

## 2020-12-21 NOTE — Progress Notes (Signed)
Subjective:   Michaela Peterson is a 65 y.o. female who presents for Medicare Annual (Subsequent) preventive examination. Virtual Visit via Telephone Note  I connected with  Michaela Peterson on 12/21/20 at  9:00 AM EDT by telephone and verified that I am speaking with the correct person using two identifiers.  Location: Patient: Home Provider: [facility] Persons participating in the virtual visit: patient/Nurse Health Advisor   I discussed the limitations, risks, security and privacy concerns of performing an evaluation and management service by telephone and the availability of in person appointments. The patient expressed understanding and agreed to proceed.  Interactive audio and video telecommunications were attempted between this nurse and patient, however failed, due to patient having technical difficulties OR patient did not have access to video capability.  We continued and completed visit with audio only.  Some vital signs may be absent or patient reported.   Michaela Driver, LPN  Review of Systems     Cardiac Risk Factors include: advanced age (>24mn, >>3women);hypertension;diabetes mellitus;obesity (BMI >30kg/m2);sedentary lifestyle     Objective:    Today's Vitals   12/21/20 0905  Weight: 179 lb (81.2 kg)  Height: 5' 1"  (1.549 m)  PainSc: 0-No pain   Body mass index is 33.82 kg/m.  Advanced Directives 12/21/2020 12/25/2019 12/21/2019 05/06/2019 11/12/2018 11/01/2017 07/30/2017  Does Patient Have a Medical Advance Directive? No No No No No No No  Would patient like information on creating a medical advance directive? No - Patient declined No - Patient declined No - Patient declined Yes (MAU/Ambulatory/Procedural Areas - Information given) No - Patient declined Yes (ED - Information included in AVS) -  Pre-existing out of facility DNR order (yellow form or pink MOST form) - - - - - - -    Current Medications (verified) Outpatient Encounter Medications as of 12/21/2020   Medication Sig   allopurinol (ZYLOPRIM) 300 MG tablet TAKE 1 TABLET BY MOUTH  DAILY . START MEDICATION ON 12/02/2019   aspirin EC 81 MG tablet Take 81 mg by mouth daily.   azelastine (ASTELIN) 0.1 % nasal spray Place 2 sprays into both nostrils 2 (two) times daily. Use in each nostril as directed   Blood Glucose Monitoring Suppl (ONE TOUCH ULTRA 2) w/Device KIT USE TO TEST ONCE DAILY   cetirizine (ZYRTEC) 10 MG tablet Take 10 mg by mouth daily.   Cholecalciferol (DIALYVITE VITAMIN D 5000) 125 MCG (5000 UT) capsule Take 5,000 Units by mouth 3 (three) times a week.   diphenhydrAMINE (BENADRYL) 25 MG tablet Take 25 mg by mouth every 6 (six) hours as needed for itching.   Ferrous Sulfate (IRON) 325 (65 Fe) MG TABS Take 1 tablet by mouth daily.   FIBER PO Take by mouth as needed.   fluticasone (FLONASE) 50 MCG/ACT nasal spray Place 2 sprays into both nostrils daily.   glipiZIDE (GLUCOTROL) 10 MG tablet TAKE 1 TABLET BY MOUTH  TWICE DAILY BEFORE MEALS   metFORMIN (GLUCOPHAGE) 1000 MG tablet Take 1 tablet (1,000 mg total) by mouth 2 (two) times daily with a meal.   MODERNA COVID-19 VACCINE 100 MCG/0.5ML injection    montelukast (SINGULAIR) 10 MG tablet TAKE 1 TABLET BY MOUTH AT  BEDTIME   NIFEdipine (ADALAT CC) 90 MG 24 hr tablet TAKE 1 TABLET BY MOUTH  DAILY   Olopatadine HCl 0.2 % SOLN Apply 1 drop to eye daily.   OneTouch Delica Lancets 340JMISC USE UP TO 4 TIMES DAILY AS  DIRECTED  ONETOUCH ULTRA test strip USE TO TEST ONCE DAILY   OVER THE COUNTER MEDICATION Apply 1 application topically daily as needed (pain). horse liniment cream   pantoprazole (PROTONIX) 40 MG tablet Take 1 tablet (40 mg total) by mouth daily before breakfast.   rosuvastatin (CRESTOR) 5 MG tablet TAKE ONE TABLET BY MOUTH EVERY MONDAY AND FRIDAY   spironolactone (ALDACTONE) 50 MG tablet TAKE 1 TABLET BY MOUTH EVERY DAY   triamcinolone cream (KENALOG) 0.1 % Apply 1 application topically daily as needed (rash).   No  facility-administered encounter medications on file as of 12/21/2020.    Allergies (verified) Gabapentin, Ace inhibitors, Aspirin, Oxycodone, Penicillins, Rebif [interferon beta-1a], and Statins   History: Past Medical History:  Diagnosis Date   Adnexal mass    Left; followed by Dr. [name]   Anemia, iron deficiency    Arthritis    At risk for cardiovascular event 06/17/2016   Chronic low back pain    Diabetes mellitus, type 2 ([place])    Dyspnea 09/19/2018   Essential hypertension    Gastroparesis    GERD (gastroesophageal reflux disease)    MS (multiple sclerosis) ([place])    Nonalcoholic fatty liver disease    Obesity    Obesity, diabetes, and hypertension syndrome ([place]) 03/12/2009   Qualifier: Diagnosis of  By: Moshe Cipro MD, Margaret     Sleep apnea 2009   Vaginitis 03/29/2018   Past Surgical History:  Procedure Laterality Date   AGILE CAPSULE N/A 08/11/2014   Procedure: AGILE CAPSULE;  Surgeon: Daneil Dolin, MD;  [facility];  Service: Endoscopy;  Laterality: N/A;  0700   BLADDER SUSPENSION     CESAREAN SECTION     x2    CHOLECYSTECTOMY     COLONOSCOPY  2010   Dr. [name]: tubular adenoma, few scattered diverticula   COLONOSCOPY N/A 10/01/2013   Dr. Pixie Casino preparation. Normal rectum. Normal colonic mucosa   COLONOSCOPY N/A 11/12/2018   sigmoid and descending colon diverticulosis. Two 4-5 mm polyps in ascending colon. (tubular adenomas). Surveillance in 5 years   DILATION AND CURETTAGE OF UTERUS     4   ESOPHAGOGASTRODUODENOSCOPY  2010   Dr. [name]: normal esophagus, small hiatal hernia, questionable pale duodenal mucosa but negative for celiac sprue.    ESOPHAGOGASTRODUODENOSCOPY N/A 10/01/2013   Dr. Rourk:gastric polyps-status post biopsy. Otherwise, normal EGD. fundic gland polyp, negative H.pylori   GIVENS CAPSULE STUDY N/A 09/08/2014   Poor prep but overall unremarkable    KNEE ARTHROSCOPY W/ DEBRIDEMENT     LAPAROSCOPY ABDOMEN DIAGNOSTIC     lysis of  adhesions   POLYPECTOMY  11/12/2018   Procedure: POLYPECTOMY;  Surgeon: Daneil Dolin, MD;  [facility]: [facility];  Service: Endoscopy;;   ROTATOR CUFF REPAIR     TENDON LENGTHENING     Left wrist   UMBILICAL HERNIA REPAIR     Family History  Problem Relation Age of Onset   Hypertension Sister    Diabetes Sister    Heart disease Sister    Kidney failure Sister    Heart failure Mother    Hypertension Mother    Arthritis Mother    Stroke Father    Hypertension Son    Hypertension Sister    Hypertension Brother    Mental illness Brother    Colon cancer Maternal Aunt    Social History   Socioeconomic History   Marital status: Married    Spouse name: [name]    Number of children: 4  Years of education: 47   Highest education level: Not on file  Occupational History   Occupation: unemployed     Employer: UNEMPLOYED  Tobacco Use   Smoking status: Never   Smokeless tobacco: Never   Tobacco comments:    patient lives with a smoker   Vaping Use   Vaping Use: Never used  Substance and Sexual Activity   Alcohol use: No   Drug use: No   Sexual activity: Yes    Birth control/protection: Post-menopausal  Other Topics Concern   Not on file  Social History Narrative   Patient lives at home with husband [name].    Patient has 4 children.    Patient has a some college.    Patient is right handed.    Patient is currently not working.    Social Determinants of Health   Financial Resource Strain: Low Risk    Difficulty of Paying Living Expenses: Not hard at all  Food Insecurity: No Food Insecurity   Worried About Charity fundraiser in the Last Year: Never true   [employer] in the Last Year: Never true  Transportation Needs: No Transportation Needs   Lack of Transportation (Medical): No   Lack of Transportation (Non-Medical): No  Physical Activity: Insufficiently Active   Days of Exercise per Week: 2 days   Minutes of Exercise per Session: 20 min  Stress: No Stress  Concern Present   Feeling of Stress : Only a little  Social Connections: Engineer, building services of Communication with Friends and Family: More than three times a week   Frequency of Social Gatherings with Friends and Family: More than three times a week   Attends Religious Services: More than 4 times per year   Active Member of [employer] or Organizations: Yes   Attends Music therapist: More than 4 times per year   Marital Status: Married    Tobacco Counseling Counseling given: Not Answered Tobacco comments: patient lives with a smoker    Clinical Intake:  Pre-visit preparation completed: Yes  Pain : No/denies pain Pain Score: 0-No pain           Diabetic?Nutrition Risk Assessment:  Has the patient had any N/V/D within the last 2 months?  No  Does the patient have any non-healing wounds?  No  Has the patient had any unintentional weight loss or weight gain?  No   Diabetes:  Is the patient diabetic?  Yes  If diabetic, was a CBG obtained today?  No  Did the patient bring in their glucometer from home?  No  How often do you monitor your CBG's? Daily.   Financial Strains and Diabetes Management:  Are you having any financial strains with the device, your supplies or your medication? Yes .  Does the patient want to be seen by Chronic Care Management for management of their diabetes?  No  Would the patient like to be referred to a Nutritionist or for Diabetic Management?  No   Diabetic Exams:  Diabetic Eye Exam: Completed 04/06/20. Overdue for diabetic eye exam. Pt has been advised about the importance in completing this exam. A referral has been placed today. Message sent to referral coordinator for scheduling purposes. Advised pt to expect a call from office referred to regarding appt.  Diabetic Foot Exam: Completed 08/31/2020. Pt has been advised about the importance in completing this exam.          Activities of Daily Living In your  present  state of health, do you have any difficulty performing the following activities: 12/21/2020  Hearing? N  Vision? N  Difficulty concentrating or making decisions? Y  Walking or climbing stairs? Y  Comment Due to knees.  Dressing or bathing? N  Doing errands, shopping? N  Preparing Food and eating ? N  Using the Toilet? N  In the past six months, have you accidently leaked urine? Y  Comment Occasional.  Do you have problems with loss of bowel control? N  Managing your Medications? N  Managing your Finances? N  Housekeeping or managing your Housekeeping? N  Some recent data might be hidden    Patient Care Team: [name], MD as PCP - General Rourk, Cristopher Estimable, MD as Consulting Physician (Gastroenterology) Dennie Bible, NP as Nurse Practitioner (Family Medicine) Elsie Saas, MD as Consulting Physician (Orthopedic Surgery) Satira Sark, MD as Consulting Physician (Cardiology)  Indicate any recent Medical Services you may have received from other than [facility] providers in the past year (date may be approximate).     Assessment:   This is a routine wellness examination for Delaynie.  Hearing/Vision screen Hearing Screening - Comments:: No hearing issues.  Vision Screening - Comments:: Glasses, up to date.   Dietary issues and exercise activities discussed: Current Exercise Habits: Home exercise routine, Type of exercise: walking, Time (Minutes): 20, Frequency (Times/Week): 2, Weekly Exercise (Minutes/Week): 40, Intensity: Mild, Exercise limited by: orthopedic condition(s);cardiac condition(s)   Goals Addressed             This Visit's Progress    Exercise 3x per week (30 min per time)         Depression Screen PHQ 2/9 Scores 12/21/2020 11/08/2020 08/31/2020 03/14/2020 02/18/2020 12/25/2019 12/21/2019  PHQ - 2 Score 1 0 0 0 0 0 1  PHQ- 9 Score - - 2 - - - 3    Fall Risk Fall Risk  12/21/2020 11/08/2020 08/31/2020 03/30/2020 03/14/2020  Falls in the past  year? 0 0 1 0 0  Number falls in past yr: 0 0 0 0 0  Injury with Fall? 0 0 1 0 0  Risk for fall due to : No Fall Risks - - - No Fall Risks  Follow up Falls prevention discussed - - - Falls evaluation completed    FALL RISK PREVENTION PERTAINING TO THE HOME:  Any stairs in or around the home? Yes  If so, are there any without handrails? No  Home free of loose throw rugs in walkways, pet beds, electrical cords, etc? Yes  Adequate lighting in your home to reduce risk of falls? Yes   ASSISTIVE DEVICES UTILIZED TO PREVENT FALLS:  Life alert? No  Use of a cane, walker or w/c? Yes  Grab bars in the bathroom? Yes  Shower chair or bench in shower? Yes  Elevated toilet seat or a handicapped toilet? Yes   TIMED UP AND GO:  Was the test performed? No . Phone visit.      Cognitive Function:     6CIT Screen 12/21/2020 12/21/2019 11/05/2018 11/01/2017  What Year? 0 points 0 points 0 points 0 points  What month? 0 points 0 points 0 points 0 points  What time? 0 points 0 points 0 points 0 points  Count back from 20 0 points 0 points 0 points 0 points  Months in reverse 0 points 0 points 0 points 0 points  Repeat phrase 0 points 0 points 0 points 0 points  Total  Score 0 0 0 0    Immunizations Immunization History  Administered Date(s) Administered   Influenza Split 01/17/2011, 01/09/2012   Influenza Whole 12/11/2006, 12/29/2008, 11/28/2009   Influenza,inj,Quad PF,6+ Mos 01/08/2013, 12/23/2013, 01/18/2015, 12/14/2015, 12/19/2016, 12/20/2017, 12/17/2018, 11/03/2019, 11/08/2020   Moderna Sars-Covid-2 Vaccination 05/20/2019, 06/17/2019, 01/26/2020, 07/28/2020, 07/28/2020   Pneumococcal Conjugate-13 10/19/2013   Pneumococcal Polysaccharide-23 11/30/2003, 08/30/2010   Td 11/30/2003   Tdap 12/23/2013   Zoster, Live 02/29/2016    TDAP status: Up to date  Flu Vaccine status: Up to date  Pneumococcal vaccine status: Up to date  Covid-19 vaccine status: Completed vaccines  Qualifies  for Shingles Vaccine? Yes   Zostavax completed Yes   Shingrix Completed?: No.    Education has been provided regarding the importance of this vaccine. Patient has been advised to call insurance company to determine out of pocket expense if they have not yet received this vaccine. Advised may also receive vaccine at local pharmacy or Health Dept. Verbalized acceptance and understanding.  Screening Tests Health Maintenance  Topic Date Due   Zoster Vaccines- Shingrix (1 of 2) Never done   DEXA SCAN  11/23/2020   PAP SMEAR-Modifier  02/19/2021   HEMOGLOBIN A1C  02/23/2021   OPHTHALMOLOGY EXAM  04/06/2021   URINE MICROALBUMIN  08/24/2021   FOOT EXAM  08/31/2021   MAMMOGRAM  10/06/2022   TETANUS/TDAP  12/24/2023   COLONOSCOPY (Pts 45-3yr Insurance coverage will need to be confirmed)  11/11/2028   INFLUENZA VACCINE  Completed   COVID-19 Vaccine  Completed   Hepatitis C Screening  Completed   HIV Screening  Completed   HPV VACCINES  Aged Out    Health Maintenance  Health Maintenance Due  Topic Date Due   Zoster Vaccines- Shingrix (1 of 2) Never done   DEXA SCAN  11/23/2020   PAP SMEAR-Modifier  02/19/2021    Colorectal cancer screening: Type of screening: Colonoscopy. Completed 11/12/2018. Repeat every 10 years  Mammogram status: Completed 10/11/2020. Repeat every year  Bone Density status: Ordered Pt states she will call back to schedule. Pt provided with contact info and advised to call to schedule appt.  Lung Cancer Screening: (Low Dose CT Chest recommended if Age 65-80years, 30 pack-year currently smoking OR have quit w/in 15years.) does not qualify.     Additional Screening:  Hepatitis C Screening: does qualify; Completed 10/06/2012  Vision Screening: Recommended annual ophthalmology exams for early detection of glaucoma and other disorders of the eye. Is the patient up to date with their annual eye exam?  Yes  Who is the provider or what is the name of the office in which  the patient attends annual eye exams?  If pt is not established with a provider, would they like to be referred to a provider to establish care? No .   Dental Screening: Recommended annual dental exams for proper oral hygiene  Community Resource Referral / Chronic Care Management: CRR required this visit?  No   CCM required this visit?  No      Plan:     I have personally reviewed and noted the following in the patient's chart:   Medical and social history Use of alcohol, tobacco or illicit drugs  Current medications and supplements including opioid prescriptions.  Functional ability and status Nutritional status Physical activity Advanced directives List of other physicians Hospitalizations, surgeries, and ER visits in previous 12 months Vitals Screenings to include cognitive, depression, and falls Referrals and appointments  In addition, I have reviewed and  discussed with patient certain preventive protocols, quality metrics, and best practice recommendations. A written personalized care plan for preventive services as well as general preventive health recommendations were provided to patient.     Michaela Driver, LPN   [id number]   Nurse Notes: Pt states she is doing okay, recently lost sister on 12/19/2020. Up to date on all Health Maintenance. Declines bone density at this time but states she will call back to schedule.

## 2020-12-22 ENCOUNTER — Other Ambulatory Visit: Payer: Self-pay | Admitting: Family Medicine

## 2020-12-23 DIAGNOSIS — G4733 Obstructive sleep apnea (adult) (pediatric): Secondary | ICD-10-CM | POA: Diagnosis not present

## 2020-12-26 ENCOUNTER — Ambulatory Visit: Payer: Medicare Other | Admitting: Internal Medicine

## 2020-12-29 DIAGNOSIS — E1149 Type 2 diabetes mellitus with other diabetic neurological complication: Secondary | ICD-10-CM | POA: Diagnosis not present

## 2020-12-30 LAB — HEMOGLOBIN A1C
Est. average glucose Bld gHb Est-mCnc: 169 mg/dL
Hgb A1c MFr Bld: 7.5 % — ABNORMAL HIGH (ref 4.8–5.6)

## 2021-01-03 ENCOUNTER — Ambulatory Visit (INDEPENDENT_AMBULATORY_CARE_PROVIDER_SITE_OTHER): Payer: Medicare Other | Admitting: Family Medicine

## 2021-01-03 ENCOUNTER — Other Ambulatory Visit: Payer: Self-pay

## 2021-01-03 ENCOUNTER — Other Ambulatory Visit (HOSPITAL_COMMUNITY)
Admission: RE | Admit: 2021-01-03 | Discharge: 2021-01-03 | Disposition: A | Discharge status: Home / Self Care | Payer: Medicare Other | Source: Ambulatory Visit | Attending: Family Medicine | Admitting: Family Medicine

## 2021-01-03 ENCOUNTER — Encounter: Payer: Self-pay | Admitting: Family Medicine

## 2021-01-03 VITALS — BP 135/78 | HR 90 | Resp 16 | Ht 61.0 in | Wt 181.1 lb

## 2021-01-03 DIAGNOSIS — Z0000 Encounter for general adult medical examination without abnormal findings: Secondary | ICD-10-CM

## 2021-01-03 DIAGNOSIS — Z01419 Encounter for gynecological examination (general) (routine) without abnormal findings: Secondary | ICD-10-CM | POA: Insufficient documentation

## 2021-01-03 DIAGNOSIS — Z1151 Encounter for screening for human papillomavirus (HPV): Secondary | ICD-10-CM | POA: Diagnosis not present

## 2021-01-03 DIAGNOSIS — Z23 Encounter for immunization: Secondary | ICD-10-CM

## 2021-01-03 DIAGNOSIS — Z124 Encounter for screening for malignant neoplasm of cervix: Secondary | ICD-10-CM

## 2021-01-03 DIAGNOSIS — Z0001 Encounter for general adult medical examination with abnormal findings: Secondary | ICD-10-CM

## 2021-01-03 DIAGNOSIS — Z780 Asymptomatic menopausal state: Secondary | ICD-10-CM

## 2021-01-03 DIAGNOSIS — I10 Essential (primary) hypertension: Secondary | ICD-10-CM

## 2021-01-03 DIAGNOSIS — E785 Hyperlipidemia, unspecified: Secondary | ICD-10-CM | POA: Diagnosis not present

## 2021-01-03 MED ORDER — OLMESARTAN MEDOXOMIL 20 MG PO TABS: 20.0000 mg | ORAL_TABLET | Freq: Every day | ORAL | 3 refills | Status: DC

## 2021-01-03 NOTE — Patient Instructions (Addendum)
Annual exam in 366 days  F/u with MD in late dec or early Jan, re eval blood pressure and blood sugar, call if you need me before  Please schedule dexa at checkout  Pneumonia 20 today IN THE OFFICE  Pap sent  New ADDITOIONAL MEDICATION FOR KIDNEY  PROTECTION AS YOU ARE DIABETIC AND SPILLING PROTEIN IN YOUR  URINE, IS OLMESARTAN 20 MG DAILY, THIS WILL ALSO LOWER YOUR BLOOD PRESSURE  Blood sugar needs improved control.  Please test fasting sugar daily the goal ranges 80-1 20.  If it remains higher than this an additional medication will be added.  I  It is important to control your blood pressure and blood sugar to protect your kidneys.  COVID and Shingrix vaccines are due please get these at the pharmacy.  FASTING LIPID, CMP AND Egfr 5 DAYS BEFORE NEXT VISIT It is important that you exercise regularly at least 30 minutes 5 times a week. If you develop chest pain, have severe difficulty breathing, or feel very tired, stop exercising immediately and seek medical attention   Think about what you will eat, plan ahead. Choose " clean, green, fresh or frozen" over canned, processed or packaged foods which are more sugary, salty and fatty. 70 to 75% of food eaten should be vegetables and fruit. Three meals at set times with snacks allowed between meals, but they must be fruit or vegetables. Aim to eat over a 12 hour period , example 7 am to 7 pm, and STOP after  your last meal of the day. Drink water,generally about 64 ounces per day, no other drink is as healthy. Fruit juice is best enjoyed in a healthy way, by EATING the fruit. Thanks for choosing [facility], we consider it a privelige to serve you.   Please schedule your shingrix vaccines and get them at the pharmacy, also the current covid vaccine

## 2021-01-03 NOTE — Progress Notes (Signed)
    Michaela Peterson     MRN: [id number]      DOB: 02/06/1956  HPI: Patient is in for annual physical exam. Diabetes and hypertension are addressed Recent labs,  are reviewed. Immunization is reviewed , and  updated   PE: BP 135/78   Pulse 90   Resp 16   Ht 5\' 1"  (1.549 m)   Wt 181 lb 1.9 oz (82.2 kg)   LMP 08/18/2012   SpO2 97%   BMI 34.22 kg/m   Pleasant  female, alert and oriented x 3, in no cardio-pulmonary distress. Afebrile. HEENT No facial trauma or asymetry. Sinuses non tender.  Extra occullar muscles intact.. External ears normal, . Neck: supple, no adenopathy,JVD or thyromegaly.No bruits.  Chest: Clear to ascultation bilaterally.No crackles or wheezes. Non tender to palpation    Cardiovascular system; Heart sounds normal,  S1 and  S2 ,no S3.  No murmur, or thrill. Apical beat not displaced Peripheral pulses normal.  Abdomen: Soft, non tender, no organomegaly or masses. No bruits. Bowel sounds normal. No guarding, tenderness or rebound.   GU: External genitalia normal female genitalia , normal female distribution of hair. No lesions. Urethral meatus normal in size, no  Prolapse, no lesions visibly  Present. Bladder non tender. Vagina pink and moist , with no visible lesions , discharge present . Adequate pelvic support no  cystocele or rectocele noted Cervix pink and appears healthy, no lesions or ulcerations noted, no discharge noted from os Uterus normal size, no adnexal masses, no cervical motion or adnexal tenderness.   Musculoskeletal exam: Full ROM of spine, hips , shoulders and knees. No deformity ,swelling or crepitus noted. No muscle wasting or atrophy.   Neurologic: Cranial nerves 2 to 12 intact. Power, tone ,sensation and reflexes normal throughout. No disturbance in gait. No tremor.  Skin: Intact, no ulceration, erythema , scaling or rash noted. Pigmentation normal throughout  Psych; Normal mood and affect. Judgement and  concentration normal   Assessment & Plan:  Annual physical exam Annual exam as documented. Counseling done  re healthy lifestyle involving commitment to 150 minutes exercise per week, heart healthy diet, and attaining healthy weight.The importance of adequate sleep also discussed. Regular seat belt use and home safety, is also discussed. Changes in health habits are decided on by the patient with goals and time frames  set for achieving them. Immunization and cancer screening needs are specifically addressed at this visit.   Hypertension goal BP (blood pressure) < 140/90 Sub optimal DASH diet and commitment to daily physical activity for a minimum of 30 minutes discussed and encouraged, as a part of hypertension management. The importance of attaining a healthy weight is also discussed.  BP/Weight 01/03/2021 12/21/2020 11/08/2020 09/19/2020 08/31/2020 08/04/2020 1/57/2620  Systolic BP 355 - 974 163 845 364 680  Diastolic BP 78 - 77 76 80 77 85  Wt. (Lbs) 181.12 179 179.12 179.2 178.8 175.2 180.8  BMI 34.22 33.82 33.84 33.86 33.78 33.1 34.16     Add olmesartan

## 2021-01-03 NOTE — Assessment & Plan Note (Signed)
Sub optimal DASH diet and commitment to daily physical activity for a minimum of 30 minutes discussed and encouraged, as a part of hypertension management. The importance of attaining a healthy weight is also discussed.  BP/Weight 01/03/2021 12/21/2020 11/08/2020 09/19/2020 08/31/2020 08/04/2020 7/35/6701  Systolic BP 410 - 301 314 388 875 797  Diastolic BP 78 - 77 76 80 77 85  Wt. (Lbs) 181.12 179 179.12 179.2 178.8 175.2 180.8  BMI 34.22 33.82 33.84 33.86 33.78 33.1 34.16     Add olmesartan

## 2021-01-03 NOTE — Assessment & Plan Note (Signed)

## 2021-01-04 ENCOUNTER — Telehealth: Payer: Self-pay

## 2021-01-04 ENCOUNTER — Other Ambulatory Visit: Payer: Self-pay

## 2021-01-04 MED ORDER — TRIAMCINOLONE ACETONIDE 0.1 % EX CREA: 1.0000 "application " | TOPICAL_CREAM | Freq: Every day | CUTANEOUS | 0 refills | Status: DC | PRN

## 2021-01-04 NOTE — Telephone Encounter (Signed)
Patient called needs triamcinolone cream  called into her pharmacy, she discuss it with Dr Moshe Cipro yesterday at her appiontment.    Pharmacy:  [facility] [place]

## 2021-01-04 NOTE — Telephone Encounter (Signed)
Med sent.

## 2021-01-06 ENCOUNTER — Other Ambulatory Visit: Payer: Self-pay

## 2021-01-06 ENCOUNTER — Ambulatory Visit (HOSPITAL_COMMUNITY)
Admission: RE | Admit: 2021-01-06 | Discharge: 2021-01-06 | Disposition: A | Discharge status: Home / Self Care | Payer: Medicare Other | Source: Ambulatory Visit | Attending: Family Medicine | Admitting: Family Medicine

## 2021-01-06 DIAGNOSIS — M85851 Other specified disorders of bone density and structure, right thigh: Secondary | ICD-10-CM | POA: Diagnosis not present

## 2021-01-06 DIAGNOSIS — Z780 Asymptomatic menopausal state: Secondary | ICD-10-CM | POA: Diagnosis not present

## 2021-01-10 LAB — CYTOLOGY - PAP
Comment: NEGATIVE
Diagnosis: NEGATIVE
Diagnosis: REACTIVE
High risk HPV: NEGATIVE

## 2021-01-27 ENCOUNTER — Other Ambulatory Visit: Payer: Self-pay

## 2021-01-27 MED ORDER — SPIRONOLACTONE 50 MG PO TABS: 50.0000 mg | ORAL_TABLET | Freq: Every day | ORAL | 3 refills | Status: DC

## 2021-01-27 MED ORDER — ONETOUCH DELICA PLUS LANCET33G MISC: 3 refills | Status: DC

## 2021-01-30 ENCOUNTER — Telehealth: Payer: Self-pay | Admitting: Family Medicine

## 2021-01-30 ENCOUNTER — Other Ambulatory Visit: Payer: Self-pay

## 2021-01-30 MED ORDER — ONETOUCH DELICA PLUS LANCET33G MISC: 3 refills | Status: DC

## 2021-01-30 NOTE — Telephone Encounter (Signed)
Sen w/ [employer]  Called in on pt  behalf  in regards to lancets   [employer] needs clarification as to if its needed 4 x a day or just once a day due to test strips being once a day    Lowella Petties    Ref # [id number]  Call back # [phone]

## 2021-01-30 NOTE — Telephone Encounter (Signed)
Resent script to test once daily and note that she was only to be testing once per day

## 2021-02-09 NOTE — Progress Notes (Signed)
Primary Care Physician: [name], MD  Primary Gastroenterologist:  Garfield Cornea, MD   Chief Complaint  Patient presents with   Abdominal Pain    Mid upper abd   Diarrhea   Constipation   Emesis    HPI: Michaela Peterson is a 65 y.o. female here for follow-up of constipation, nausea, early satiety.  Patient last seen in May 2022.  She has a history of IDA status postcolonoscopy/EGD in July 2015 normal aside from gastric polyps (fundic gland polyps on pathology), capsule unremarkable June 2016.  History of gastroparesis with abnormal GES in 2016.  Most recent colonoscopy in September 2020 with 2 tubular adenomas removed, surveillance due in 2025.  Labs from June 2022 showed hemoglobin of 10.7, MCV 87.  Hemoglobin had been 12 one year ago.  CT abdomen and pelvis with contrast December 16, 2020 for left-sided abdominal pain and nausea showed distal colonic diverticulosis but no diverticulitis.  Today: complains of solid food dysphagia. she complains of early satiety, intermittent vomiting. Gets so full and miserable. Last vomiting about 1-2 weeks ago. Symptoms seem to be worse since started olmesartan. Historically has had constipation issues but now she complains of loose stool, some days only once and others 5-6 times. In the past it was felt that some of her loose stools could be due to overflow diarrhea and she was started on miralax. She said it did not help and now she is only having loose stools except for occasional solid stool. No hard stool. No melena, brbpr. Has held fiber and miralax. Still taking OTC iron only once daily, did not increase to two at PCP request. Denies NSAIDs. She takes ASA 37m some days. She complains of abdominal pain, often mid abdomen but does migrate. Not sure of anything that makes better or worse. Heartburn doing ok, never went up to BID on pantoprazole.     Current Outpatient Medications  Medication Sig Dispense Refill   allopurinol  (ZYLOPRIM) 300 MG tablet TAKE 1 TABLET BY MOUTH  DAILY . START MEDICATION ON 12/02/2019 90 tablet 3   aspirin EC 81 MG tablet Take 81 mg by mouth daily.     azelastine (ASTELIN) 0.1 % nasal spray Place 2 sprays into both nostrils 2 (two) times daily. Use in each nostril as directed 30 mL 12   Blood Glucose Monitoring Suppl (ONE TOUCH ULTRA 2) w/Device KIT USE TO TEST ONCE DAILY 1 kit 0   cetirizine (ZYRTEC) 10 MG tablet Take 10 mg by mouth daily.     Cholecalciferol (DIALYVITE VITAMIN D 5000) 125 MCG (5000 UT) capsule Take 5,000 Units by mouth 3 (three) times a week.     diphenhydrAMINE (BENADRYL) 25 MG tablet Take 25 mg by mouth every 6 (six) hours as needed for itching.     Ferrous Sulfate (IRON) 325 (65 Fe) MG TABS Take 1 tablet by mouth daily.     FIBER PO Take by mouth as needed.     fluticasone (FLONASE) 50 MCG/ACT nasal spray Place 2 sprays into both nostrils daily. 9.9 mL 1   glipiZIDE (GLUCOTROL) 10 MG tablet TAKE 1 TABLET BY MOUTH  TWICE DAILY BEFORE MEALS 180 tablet 3   Lancets (ONETOUCH DELICA PLUS LVVZSMO70B MISC Test once daily DX e11.9 100 each 3   metFORMIN (GLUCOPHAGE) 1000 MG tablet TAKE 1 TABLET BY MOUTH  TWICE DAILY WITH MEALS 180 tablet 3   montelukast (SINGULAIR) 10 MG tablet TAKE 1 TABLET BY MOUTH AT  BEDTIME 90 tablet 3   NIFEdipine (ADALAT CC) 90 MG 24 hr tablet TAKE 1 TABLET BY MOUTH  DAILY 90 tablet 3   olmesartan (BENICAR) 20 MG tablet Take 1 tablet (20 mg total) by mouth daily. 30 tablet 3   Olopatadine HCl 0.2 % SOLN Apply 1 drop to eye daily.     ONETOUCH ULTRA test strip USE TO TEST ONCE DAILY 100 strip 3   OVER THE COUNTER MEDICATION Apply 1 application topically daily as needed (pain). horse liniment cream     pantoprazole (PROTONIX) 40 MG tablet Take 1 tablet (40 mg total) by mouth daily before breakfast. 90 tablet 3   rosuvastatin (CRESTOR) 5 MG tablet TAKE ONE TABLET BY MOUTH EVERY MONDAY AND FRIDAY 26 tablet 4   spironolactone (ALDACTONE) 50 MG tablet Take 1  tablet (50 mg total) by mouth daily. 30 tablet 3   triamcinolone cream (KENALOG) 0.1 % Apply 1 application topically daily as needed (rash). 30 g 0   No current facility-administered medications for this visit.    Allergies as of 02/10/2021 - Review Complete 02/10/2021  Allergen Reaction Noted   Gabapentin Shortness Of Breath 02/13/2017   Ace inhibitors Cough 03/26/2012   Aspirin     Oxycodone Hives 04/01/2018   Penicillins Hives    Rebif [interferon beta-1a] Other (See Comments) 05/01/2018   Statins  10/11/2012   Past Medical History:  Diagnosis Date   Adnexal mass    Left; followed by Dr. Ferguson   Anemia, iron deficiency    Arthritis    At risk for cardiovascular event 06/17/2016   Chronic low back pain    Diabetes mellitus, type 2 (HCC)    Dyspnea 09/19/2018   Essential hypertension    Gastroparesis    GERD (gastroesophageal reflux disease)    MS (multiple sclerosis) (HCC)    Nonalcoholic fatty liver disease    Obesity    Obesity, diabetes, and hypertension syndrome (HCC) 03/12/2009   Qualifier: Diagnosis of  By: Simpson MD, Margaret     Sleep apnea 2009   Vaginitis 03/29/2018   Past Surgical History:  Procedure Laterality Date   AGILE CAPSULE N/A 08/11/2014   Procedure: AGILE CAPSULE;  Surgeon: Robert M Rourk, MD;  Location: [facility];  Service: Endoscopy;  Laterality: N/A;  0700   BLADDER SUSPENSION     CESAREAN SECTION     x2    CHOLECYSTECTOMY     COLONOSCOPY  2010   Dr. Rourk: tubular adenoma, few scattered diverticula   COLONOSCOPY N/A 10/01/2013   Dr. Rourk:Adequate preparation. Normal rectum. Normal colonic mucosa   COLONOSCOPY N/A 11/12/2018   sigmoid and descending colon diverticulosis. Two 4-5 mm polyps in ascending colon. (tubular adenomas). Surveillance in 5 years   DILATION AND CURETTAGE OF UTERUS     4   ESOPHAGOGASTRODUODENOSCOPY  2010   Dr. Rourk: normal esophagus, small hiatal hernia, questionable pale duodenal mucosa but negative for celiac  sprue.    ESOPHAGOGASTRODUODENOSCOPY N/A 10/01/2013   Dr. Rourk:gastric polyps-status post biopsy. Otherwise, normal EGD. fundic gland polyp, negative H.pylori   GIVENS CAPSULE STUDY N/A 09/08/2014   Poor prep but overall unremarkable    KNEE ARTHROSCOPY W/ DEBRIDEMENT     LAPAROSCOPY ABDOMEN DIAGNOSTIC     lysis of adhesions   POLYPECTOMY  11/12/2018   Procedure: POLYPECTOMY;  Surgeon: Rourk, Robert M, MD;  Location: [facility];  Service: Endoscopy;;   ROTATOR CUFF REPAIR     TENDON LENGTHENING       Left wrist   UMBILICAL HERNIA REPAIR     Family History  Problem Relation Age of Onset   Hypertension Sister    Diabetes Sister    Heart disease Sister    Kidney failure Sister    Heart failure Mother    Hypertension Mother    Arthritis Mother    Stroke Father    Hypertension Son    Hypertension Sister    Hypertension Brother    Mental illness Brother    Colon cancer Maternal Aunt    Social History   Tobacco Use   Smoking status: Never   Smokeless tobacco: Never   Tobacco comments:    patient lives with a smoker   Vaping Use   Vaping Use: Never used  Substance Use Topics   Alcohol use: No   Drug use: No    ROS:  General: Negative for anorexia, weight loss, fever, chills, fatigue, weakness. ENT: Negative for hoarseness,  nasal congestion. See hpi CV: Negative for chest pain, angina, palpitations, dyspnea on exertion, peripheral edema.  Respiratory: Negative for dyspnea at rest, dyspnea on exertion, cough, sputum, wheezing.  GI: See history of present illness. GU:  Negative for dysuria, hematuria, urinary incontinence, urinary frequency, nocturnal urination.  Endo: Negative for unusual weight change.    Physical Examination:   BP (!) 145/76   Pulse 80   Temp (!) 97.1 F (36.2 C) (Temporal)   Ht 5' 1" (1.549 m)   Wt 180 lb 12.8 oz (82 kg)   LMP 08/18/2012   BMI 34.16 kg/m   General: Well-nourished, well-developed in no acute distress.  Eyes: No  icterus. Mouth: masked Lungs: Clear to auscultation bilaterally.  Heart: Regular rate and rhythm, no rubs or gallops. 2/6 SEM.  Abdomen: Bowel sounds are normal, nontender, nondistended, no hepatosplenomegaly or masses, no abdominal bruits or hernia , no rebound or guarding.   Extremities: No lower extremity edema. No clubbing or deformities. Neuro: Alert and oriented x 4   Skin: Warm and dry, no jaundice.   Psych: Alert and cooperative, normal mood and affect.  Labs:  Lab Results  Component Value Date   CREATININE 1.00 12/16/2020   BUN 22 11/08/2020   NA 140 11/08/2020   K 4.8 11/08/2020   CL 103 11/08/2020   CO2 22 11/08/2020   Lab Results  Component Value Date   ALT 13 11/08/2020   AST 14 11/08/2020   ALKPHOS 116 11/08/2020   BILITOT 0.4 11/08/2020   Lab Results  Component Value Date   WBC 8.2 08/24/2020   HGB 10.7 (L) 08/24/2020   HCT 33.4 (L) 08/24/2020   MCV 87 08/24/2020   PLT 406 08/24/2020   Lab Results  Component Value Date   LIPASE 56 11/08/2020   Lab Results  Component Value Date   IRON 34 (L) 10/16/2019   TIBC 351 10/16/2019   FERRITIN 33 10/16/2019   Lab Results  Component Value Date   HGBA1C 7.5 (H) 12/29/2020   Lab Results  Component Value Date   ALT 13 11/08/2020   AST 14 11/08/2020   ALKPHOS 116 11/08/2020   BILITOT 0.4 11/08/2020   Lab Results  Component Value Date   TSH 2.680 08/24/2020   Lab Results  Component Value Date   IRON 34 (L) 10/16/2019   TIBC 351 10/16/2019   FERRITIN 33 10/16/2019       Imaging Studies: No results found.   Assessment:  Early satiety with N/V/abdominal pain: h/o chronic GERD, gastroparesis. No  longer on NSAIDs. Continues daily pantoprazole. No improvement in her symptoms. Remote EGD in 2015. Recommend update EGD to evaluate for gastritis/PUD/complicated GERD. Typical heartburn adequately managed at this time.   Diarrhea: previous constipation with concerns for overflow diarrhea. Patient began  fiber and regular miralax but now having diarrhea daily, sometimes up to 6 times. She stopped fiber and miralax and BMs have not improved. Rule out infectious etiology.   IDA: Hgb declined as outlined. No overt GI bleeding. She is no longer on NSAIDs. Colonoscopy up to date. Plans for EGD. Update labs and iron panel.   Esophageal dysphagia: to solids. Plans for EGD/ED in near future.   Plan: Egd/esophageal dilation with Dr. [name]. ASA II. If schedule not available, she is agreeable to have Dr. Abbey Chatters perform procedure.  I have discussed the risks, alternatives, benefits with regards to but not limited to the risk of reaction to medication, bleeding, infection, perforation and the patient is agreeable to proceed. Written consent to be obtained. Check CBC, iron/tibc/ferritin. Gi profile. Continue pantoprazole 27m daily. Start Align one daily. Samples provided. Continue probiotic for four weeks.  Try lose dose imodium on days she has more than 2 loose stools. Take 1/2 to 1 tablet per day as needed.

## 2021-02-09 NOTE — H&P (View-Only) (Signed)
    Primary Care Physician: Simpson, Margaret E, MD  Primary Gastroenterologist:  Michael Rourk, MD   Chief Complaint  Patient presents with   Abdominal Pain    Mid upper abd   Diarrhea   Constipation   Emesis    HPI: Michaela Peterson is a 65 y.o. female here for follow-up of constipation, nausea, early satiety.  Patient last seen in May 2022.  She has a history of IDA status postcolonoscopy/EGD in July 2015 normal aside from gastric polyps (fundic gland polyps on pathology), capsule unremarkable June 2016.  History of gastroparesis with abnormal GES in 2016.  Most recent colonoscopy in September 2020 with 2 tubular adenomas removed, surveillance due in 2025.  Labs from June 2022 showed hemoglobin of 10.7, MCV 87.  Hemoglobin had been 12 one year ago.  CT abdomen and pelvis with contrast December 16, 2020 for left-sided abdominal pain and nausea showed distal colonic diverticulosis but no diverticulitis.  Today: complains of solid food dysphagia. she complains of early satiety, intermittent vomiting. Gets so full and miserable. Last vomiting about 1-2 weeks ago. Symptoms seem to be worse since started olmesartan. Historically has had constipation issues but now she complains of loose stool, some days only once and others 5-6 times. In the past it was felt that some of her loose stools could be due to overflow diarrhea and she was started on miralax. She said it did not help and now she is only having loose stools except for occasional solid stool. No hard stool. No melena, brbpr. Has held fiber and miralax. Still taking OTC iron only once daily, did not increase to two at PCP request. Denies NSAIDs. She takes ASA 81mg some days. She complains of abdominal pain, often mid abdomen but does migrate. Not sure of anything that makes better or worse. Heartburn doing ok, never went up to BID on pantoprazole.     Current Outpatient Medications  Medication Sig Dispense Refill   allopurinol  (ZYLOPRIM) 300 MG tablet TAKE 1 TABLET BY MOUTH  DAILY . START MEDICATION ON 12/02/2019 90 tablet 3   aspirin EC 81 MG tablet Take 81 mg by mouth daily.     azelastine (ASTELIN) 0.1 % nasal spray Place 2 sprays into both nostrils 2 (two) times daily. Use in each nostril as directed 30 mL 12   Blood Glucose Monitoring Suppl (ONE TOUCH ULTRA 2) w/Device KIT USE TO TEST ONCE DAILY 1 kit 0   cetirizine (ZYRTEC) 10 MG tablet Take 10 mg by mouth daily.     Cholecalciferol (DIALYVITE VITAMIN D 5000) 125 MCG (5000 UT) capsule Take 5,000 Units by mouth 3 (three) times a week.     diphenhydrAMINE (BENADRYL) 25 MG tablet Take 25 mg by mouth every 6 (six) hours as needed for itching.     Ferrous Sulfate (IRON) 325 (65 Fe) MG TABS Take 1 tablet by mouth daily.     FIBER PO Take by mouth as needed.     fluticasone (FLONASE) 50 MCG/ACT nasal spray Place 2 sprays into both nostrils daily. 9.9 mL 1   glipiZIDE (GLUCOTROL) 10 MG tablet TAKE 1 TABLET BY MOUTH  TWICE DAILY BEFORE MEALS 180 tablet 3   Lancets (ONETOUCH DELICA PLUS LANCET33G) MISC Test once daily DX e11.9 100 each 3   metFORMIN (GLUCOPHAGE) 1000 MG tablet TAKE 1 TABLET BY MOUTH  TWICE DAILY WITH MEALS 180 tablet 3   montelukast (SINGULAIR) 10 MG tablet TAKE 1 TABLET BY MOUTH AT    BEDTIME 90 tablet 3   NIFEdipine (ADALAT CC) 90 MG 24 hr tablet TAKE 1 TABLET BY MOUTH  DAILY 90 tablet 3   olmesartan (BENICAR) 20 MG tablet Take 1 tablet (20 mg total) by mouth daily. 30 tablet 3   Olopatadine HCl 0.2 % SOLN Apply 1 drop to eye daily.     ONETOUCH ULTRA test strip USE TO TEST ONCE DAILY 100 strip 3   OVER THE COUNTER MEDICATION Apply 1 application topically daily as needed (pain). horse liniment cream     pantoprazole (PROTONIX) 40 MG tablet Take 1 tablet (40 mg total) by mouth daily before breakfast. 90 tablet 3   rosuvastatin (CRESTOR) 5 MG tablet TAKE ONE TABLET BY MOUTH EVERY MONDAY AND FRIDAY 26 tablet 4   spironolactone (ALDACTONE) 50 MG tablet Take 1  tablet (50 mg total) by mouth daily. 30 tablet 3   triamcinolone cream (KENALOG) 0.1 % Apply 1 application topically daily as needed (rash). 30 g 0   No current facility-administered medications for this visit.    Allergies as of 02/10/2021 - Review Complete 02/10/2021  Allergen Reaction Noted   Gabapentin Shortness Of Breath 02/13/2017   Ace inhibitors Cough 03/26/2012   Aspirin     Oxycodone Hives 04/01/2018   Penicillins Hives    Rebif [interferon beta-1a] Other (See Comments) 05/01/2018   Statins  10/11/2012   Past Medical History:  Diagnosis Date   Adnexal mass    Left; followed by Dr. Ferguson   Anemia, iron deficiency    Arthritis    At risk for cardiovascular event 06/17/2016   Chronic low back pain    Diabetes mellitus, type 2 (HCC)    Dyspnea 09/19/2018   Essential hypertension    Gastroparesis    GERD (gastroesophageal reflux disease)    MS (multiple sclerosis) (HCC)    Nonalcoholic fatty liver disease    Obesity    Obesity, diabetes, and hypertension syndrome (HCC) 03/12/2009   Qualifier: Diagnosis of  By: Simpson MD, Margaret     Sleep apnea 2009   Vaginitis 03/29/2018   Past Surgical History:  Procedure Laterality Date   AGILE CAPSULE N/A 08/11/2014   Procedure: AGILE CAPSULE;  Surgeon: Robert M Rourk, MD;  Location: [facility];  Service: Endoscopy;  Laterality: N/A;  0700   BLADDER SUSPENSION     CESAREAN SECTION     x2    CHOLECYSTECTOMY     COLONOSCOPY  2010   Dr. Rourk: tubular adenoma, few scattered diverticula   COLONOSCOPY N/A 10/01/2013   Dr. Rourk:Adequate preparation. Normal rectum. Normal colonic mucosa   COLONOSCOPY N/A 11/12/2018   sigmoid and descending colon diverticulosis. Two 4-5 mm polyps in ascending colon. (tubular adenomas). Surveillance in 5 years   DILATION AND CURETTAGE OF UTERUS     4   ESOPHAGOGASTRODUODENOSCOPY  2010   Dr. Rourk: normal esophagus, small hiatal hernia, questionable pale duodenal mucosa but negative for celiac  sprue.    ESOPHAGOGASTRODUODENOSCOPY N/A 10/01/2013   Dr. Rourk:gastric polyps-status post biopsy. Otherwise, normal EGD. fundic gland polyp, negative H.pylori   GIVENS CAPSULE STUDY N/A 09/08/2014   Poor prep but overall unremarkable    KNEE ARTHROSCOPY W/ DEBRIDEMENT     LAPAROSCOPY ABDOMEN DIAGNOSTIC     lysis of adhesions   POLYPECTOMY  11/12/2018   Procedure: POLYPECTOMY;  Surgeon: Rourk, Robert M, MD;  Location: [facility];  Service: Endoscopy;;   ROTATOR CUFF REPAIR     TENDON LENGTHENING       Left wrist   UMBILICAL HERNIA REPAIR     Family History  Problem Relation Age of Onset   Hypertension Sister    Diabetes Sister    Heart disease Sister    Kidney failure Sister    Heart failure Mother    Hypertension Mother    Arthritis Mother    Stroke Father    Hypertension Son    Hypertension Sister    Hypertension Brother    Mental illness Brother    Colon cancer Maternal Aunt    Social History   Tobacco Use   Smoking status: Never   Smokeless tobacco: Never   Tobacco comments:    patient lives with a smoker   Vaping Use   Vaping Use: Never used  Substance Use Topics   Alcohol use: No   Drug use: No    ROS:  General: Negative for anorexia, weight loss, fever, chills, fatigue, weakness. ENT: Negative for hoarseness,  nasal congestion. See hpi CV: Negative for chest pain, angina, palpitations, dyspnea on exertion, peripheral edema.  Respiratory: Negative for dyspnea at rest, dyspnea on exertion, cough, sputum, wheezing.  GI: See history of present illness. GU:  Negative for dysuria, hematuria, urinary incontinence, urinary frequency, nocturnal urination.  Endo: Negative for unusual weight change.    Physical Examination:   BP (!) 145/76   Pulse 80   Temp (!) 97.1 F (36.2 C) (Temporal)   Ht 5' 1" (1.549 m)   Wt 180 lb 12.8 oz (82 kg)   LMP 08/18/2012   BMI 34.16 kg/m   General: Well-nourished, well-developed in no acute distress.  Eyes: No  icterus. Mouth: masked Lungs: Clear to auscultation bilaterally.  Heart: Regular rate and rhythm, no rubs or gallops. 2/6 SEM.  Abdomen: Bowel sounds are normal, nontender, nondistended, no hepatosplenomegaly or masses, no abdominal bruits or hernia , no rebound or guarding.   Extremities: No lower extremity edema. No clubbing or deformities. Neuro: Alert and oriented x 4   Skin: Warm and dry, no jaundice.   Psych: Alert and cooperative, normal mood and affect.  Labs:  Lab Results  Component Value Date   CREATININE 1.00 12/16/2020   BUN 22 11/08/2020   NA 140 11/08/2020   K 4.8 11/08/2020   CL 103 11/08/2020   CO2 22 11/08/2020   Lab Results  Component Value Date   ALT 13 11/08/2020   AST 14 11/08/2020   ALKPHOS 116 11/08/2020   BILITOT 0.4 11/08/2020   Lab Results  Component Value Date   WBC 8.2 08/24/2020   HGB 10.7 (L) 08/24/2020   HCT 33.4 (L) 08/24/2020   MCV 87 08/24/2020   PLT 406 08/24/2020   Lab Results  Component Value Date   LIPASE 56 11/08/2020   Lab Results  Component Value Date   IRON 34 (L) 10/16/2019   TIBC 351 10/16/2019   FERRITIN 33 10/16/2019   Lab Results  Component Value Date   HGBA1C 7.5 (H) 12/29/2020   Lab Results  Component Value Date   ALT 13 11/08/2020   AST 14 11/08/2020   ALKPHOS 116 11/08/2020   BILITOT 0.4 11/08/2020   Lab Results  Component Value Date   TSH 2.680 08/24/2020   Lab Results  Component Value Date   IRON 34 (L) 10/16/2019   TIBC 351 10/16/2019   FERRITIN 33 10/16/2019       Imaging Studies: No results found.   Assessment:  Early satiety with N/V/abdominal pain: h/o chronic GERD, gastroparesis. No   longer on NSAIDs. Continues daily pantoprazole. No improvement in her symptoms. Remote EGD in 2015. Recommend update EGD to evaluate for gastritis/PUD/complicated GERD. Typical heartburn adequately managed at this time.   Diarrhea: previous constipation with concerns for overflow diarrhea. Patient began  fiber and regular miralax but now having diarrhea daily, sometimes up to 6 times. She stopped fiber and miralax and BMs have not improved. Rule out infectious etiology.   IDA: Hgb declined as outlined. No overt GI bleeding. She is no longer on NSAIDs. Colonoscopy up to date. Plans for EGD. Update labs and iron panel.   Esophageal dysphagia: to solids. Plans for EGD/ED in near future.   Plan: Egd/esophageal dilation with Dr. Rourk. ASA II. If schedule not available, she is agreeable to have Dr. Carver perform procedure.  I have discussed the risks, alternatives, benefits with regards to but not limited to the risk of reaction to medication, bleeding, infection, perforation and the patient is agreeable to proceed. Written consent to be obtained. Check CBC, iron/tibc/ferritin. Gi profile. Continue pantoprazole 40mg daily. Start Align one daily. Samples provided. Continue probiotic for four weeks.  Try lose dose imodium on days she has more than 2 loose stools. Take 1/2 to 1 tablet per day as needed.    

## 2021-02-10 ENCOUNTER — Telehealth: Payer: Self-pay | Admitting: *Deleted

## 2021-02-10 ENCOUNTER — Other Ambulatory Visit: Payer: Self-pay

## 2021-02-10 ENCOUNTER — Ambulatory Visit: Payer: Medicare Other | Admitting: Gastroenterology

## 2021-02-10 ENCOUNTER — Encounter: Payer: Self-pay | Admitting: Gastroenterology

## 2021-02-10 VITALS — BP 145/76 | HR 80 | Temp 97.1°F | Ht 61.0 in | Wt 180.8 lb

## 2021-02-10 DIAGNOSIS — K219 Gastro-esophageal reflux disease without esophagitis: Secondary | ICD-10-CM

## 2021-02-10 DIAGNOSIS — R1110 Vomiting, unspecified: Secondary | ICD-10-CM

## 2021-02-10 DIAGNOSIS — R109 Unspecified abdominal pain: Secondary | ICD-10-CM | POA: Insufficient documentation

## 2021-02-10 DIAGNOSIS — K3184 Gastroparesis: Secondary | ICD-10-CM

## 2021-02-10 DIAGNOSIS — R197 Diarrhea, unspecified: Secondary | ICD-10-CM | POA: Diagnosis not present

## 2021-02-10 DIAGNOSIS — R6881 Early satiety: Secondary | ICD-10-CM

## 2021-02-10 DIAGNOSIS — R1319 Other dysphagia: Secondary | ICD-10-CM | POA: Insufficient documentation

## 2021-02-10 DIAGNOSIS — R1010 Upper abdominal pain, unspecified: Secondary | ICD-10-CM | POA: Diagnosis not present

## 2021-02-10 DIAGNOSIS — D649 Anemia, unspecified: Secondary | ICD-10-CM | POA: Diagnosis not present

## 2021-02-10 NOTE — Patient Instructions (Addendum)
Please have labs and stool test done. Upper endoscopy as scheduled. See separate instructions.  Start Align (probiotic) one daily. Samples provided. Would try to stay on a probiotic for four weeks. You can select any other the counter once your samples are out.  You can try low dose imodium on days you have more than 2 loose stools. Take 1/2 tablet to 1 tablet at most.  We will be in touch with results as available.

## 2021-02-10 NOTE — Telephone Encounter (Signed)
Pt left prior to scheduling. Called pt, LMOVM

## 2021-02-11 LAB — CBC WITH DIFFERENTIAL/PLATELET
Basophils Absolute: 0.1 10*3/uL (ref 0.0–0.2)
Basos: 1 %
EOS (ABSOLUTE): 0.1 10*3/uL (ref 0.0–0.4)
Eos: 1 %
Hematocrit: 37.1 % (ref 34.0–46.6)
Hemoglobin: 12.4 g/dL (ref 11.1–15.9)
Immature Grans (Abs): 0 10*3/uL (ref 0.0–0.1)
Immature Granulocytes: 0 %
Lymphocytes Absolute: 3.8 10*3/uL — ABNORMAL HIGH (ref 0.7–3.1)
Lymphs: 42 %
MCH: 28.3 pg (ref 26.6–33.0)
MCHC: 33.4 g/dL (ref 31.5–35.7)
MCV: 85 fL (ref 79–97)
Monocytes Absolute: 0.3 10*3/uL (ref 0.1–0.9)
Monocytes: 4 %
Neutrophils Absolute: 4.9 10*3/uL (ref 1.4–7.0)
Neutrophils: 52 %
Platelets: 406 10*3/uL (ref 150–450)
RBC: 4.38 x10E6/uL (ref 3.77–5.28)
RDW: 14.5 % (ref 11.7–15.4)
WBC: 9.2 10*3/uL (ref 3.4–10.8)

## 2021-02-11 LAB — IRON,TIBC AND FERRITIN PANEL
Ferritin: 60 ng/mL (ref 15–150)
Iron Saturation: 10 % — ABNORMAL LOW (ref 15–55)
Iron: 30 ug/dL (ref 27–139)
Total Iron Binding Capacity: 309 ug/dL (ref 250–450)
UIBC: 279 ug/dL (ref 118–369)

## 2021-02-13 ENCOUNTER — Telehealth: Payer: Self-pay | Admitting: Internal Medicine

## 2021-02-13 NOTE — Telephone Encounter (Signed)
SEE PRIOR NOTE 

## 2021-02-13 NOTE — Telephone Encounter (Signed)
PATIENT RETURNED CALL  

## 2021-02-13 NOTE — Telephone Encounter (Signed)
Spoke with pt. She is scheduled with Dr. Abbey Chatters for 12/27. Aware will mail prep instructions and also discussed instructions. Pt aware needs labs done prior. Will have done.   PA approved via [facility]. Auth# [id number], DOS: Mar 07, 2021 - Mar 11, 2021

## 2021-02-16 NOTE — Progress Notes (Signed)
HPI female never smoker followed for OSA/ insomnia, complicated by HBP, GERD, DM 2/gastroparesis, Multiple Sclerosis Home Sleep Test- 08/27/16-AHI 25.4/hour, desaturation to 80%, body weight 184.6 pounds ----------------------------------------------------------------------------------------   09/19/20- 65 year old female never smoker(+lives w smoker) followed for OSA/ insomnia, complicated by HBP, GERD, DM 2/gastroparesis, Multiple Sclerosis CPAP auto 5-12/ [employer] Download- Body weight today- 179.2 -----Can't tolerate full face mask has not been using C-Pap Never went for mask fitting as planned. Much family stress- several in nursing homes. Discussed whether to continue trying to treat for OSA. Agrees to update HST. Then consider mask fitting. Has no upper teeth, so no OAP.  02/17/21- 65 year old female never smoker(+lives w smoker) followed for OSA/ insomnia, complicated by HBP, GERD, DM 2/gastroparesis, Multiple Sclerosis HST 12/12/20- AHI 46.9/ hr, desaturation to 76%, body weight 184 lbs Download- Body weight today- 183 lbs Covid vax- Flu vax- She has a CPAP machine but has not used it in years.  We discussed her updated sleep study and I explained that CPAP would be the best treatment for scores in this range.  She is willing to restart but asks that we use [place] which is closer to her home. Being evaluated for persistent nausea.  ROS-see HPI   + = positive Constitutional:    weight loss, night sweats, fevers, chills, + fatigue, lassitude. HEENT:    headaches, difficulty swallowing, tooth/dental problems, sore throat,       sneezing, itching, ear ache, nasal congestion, post nasal drip, snoring CV:    chest pain, orthopnea, PND, swelling in lower extremities, anasarca,                                   dizziness, palpitations Resp:   +shortness of breath with exertion or at rest.                productive cough,   non-productive cough, coughing up of blood.               change in color of mucus.  wheezing.   Skin:    rash or lesions. GI:  No-   heartburn, indigestion, abdominal pain, +nausea, vomiting, diarrhea,                 change in bowel habits, loss of appetite GU: dysuria, change in color of urine, no urgency or frequency.   flank pain. MS:   joint pain, stiffness, decreased range of motion, back pain. Neuro-     nothing unusual Psych:  change in mood or affect.  depression or anxiety.   memory loss.  OBJ- Physical Exam General- Alert, Oriented, Affect-appropriate, Distress- none acute, + obese Skin- rash-none, lesions- none, excoriation- none Lymphadenopathy- none Head- atraumatic            Eyes- Gross vision intact, PERRLA, conjunctivae and secretions clear            Ears- Hearing, canals-normal            Nose- Clear, no-Septal dev, mucus, polyps, erosion, perforation             Throat- Mallampati IV , mucosa clear , drainage- none, tonsils- atrophic,  +no upper teeth Neck- flexible , trachea midline, no stridor , thyroid nl, carotid no bruit Chest - symmetrical excursion , unlabored           Heart/CV- RRR , +murmur+2 S LUSB , no gallop  , no rub, nl  s1 s2                           - JVD- none , edema- none, stasis changes- none, varices- none           Lung- clear to P&A, wheeze- none, cough- none , dullness-none, rub- none           Chest wall-  Abd-  Br/ Gen/ Rectal- Not done, not indicated Extrem- cyanosis- none, clubbing, none, atrophy- none, strength- nl Neuro- grossly intact to observation

## 2021-02-17 ENCOUNTER — Encounter: Payer: Self-pay | Admitting: Internal Medicine

## 2021-02-17 ENCOUNTER — Ambulatory Visit: Payer: Medicare Other | Admitting: Internal Medicine

## 2021-02-17 ENCOUNTER — Other Ambulatory Visit: Payer: Self-pay

## 2021-02-17 VITALS — BP 142/76 | HR 84 | Ht 61.0 in | Wt 183.0 lb

## 2021-02-17 DIAGNOSIS — G4733 Obstructive sleep apnea (adult) (pediatric): Secondary | ICD-10-CM | POA: Diagnosis not present

## 2021-02-17 DIAGNOSIS — K219 Gastro-esophageal reflux disease without esophagitis: Secondary | ICD-10-CM | POA: Diagnosis not present

## 2021-02-17 NOTE — Assessment & Plan Note (Signed)
She is pending upper endoscopy later this month.  Notes ongoing nausea.

## 2021-02-17 NOTE — Assessment & Plan Note (Signed)
Severe OSA.  CPAP is the appropriate treatment.  Weight loss will help. Plan-she requests [employer].  Replace old CPAP machine auto 5-20

## 2021-02-17 NOTE — Patient Instructions (Signed)
Order- new DME ( she asks for [employer])  new CPAP auto 5-20, mask of choice, humidifier, supplies, AirView/ card  Please call if we can help  You can ask Dr Moshe Cipro about your heart murmur. She might want you to get an Echocardiogram.

## 2021-02-20 DIAGNOSIS — M79672 Pain in left foot: Secondary | ICD-10-CM | POA: Diagnosis not present

## 2021-02-20 DIAGNOSIS — M79674 Pain in right toe(s): Secondary | ICD-10-CM | POA: Diagnosis not present

## 2021-02-20 DIAGNOSIS — L110 Acquired keratosis follicularis: Secondary | ICD-10-CM | POA: Diagnosis not present

## 2021-02-20 DIAGNOSIS — M79671 Pain in right foot: Secondary | ICD-10-CM | POA: Diagnosis not present

## 2021-02-20 DIAGNOSIS — M79675 Pain in left toe(s): Secondary | ICD-10-CM | POA: Diagnosis not present

## 2021-02-20 DIAGNOSIS — E1140 Type 2 diabetes mellitus with diabetic neuropathy, unspecified: Secondary | ICD-10-CM | POA: Diagnosis not present

## 2021-02-20 DIAGNOSIS — I739 Peripheral vascular disease, unspecified: Secondary | ICD-10-CM | POA: Diagnosis not present

## 2021-02-21 ENCOUNTER — Other Ambulatory Visit: Payer: Self-pay | Admitting: Gastroenterology

## 2021-02-21 DIAGNOSIS — R197 Diarrhea, unspecified: Secondary | ICD-10-CM | POA: Diagnosis not present

## 2021-02-21 DIAGNOSIS — R1010 Upper abdominal pain, unspecified: Secondary | ICD-10-CM | POA: Diagnosis not present

## 2021-02-21 DIAGNOSIS — R1319 Other dysphagia: Secondary | ICD-10-CM | POA: Diagnosis not present

## 2021-02-21 DIAGNOSIS — D649 Anemia, unspecified: Secondary | ICD-10-CM | POA: Diagnosis not present

## 2021-02-21 DIAGNOSIS — K219 Gastro-esophageal reflux disease without esophagitis: Secondary | ICD-10-CM | POA: Diagnosis not present

## 2021-02-22 DIAGNOSIS — E119 Type 2 diabetes mellitus without complications: Secondary | ICD-10-CM | POA: Diagnosis not present

## 2021-02-22 LAB — HM DIABETES EYE EXAM

## 2021-02-25 LAB — GI PROFILE, STOOL, PCR

## 2021-02-27 ENCOUNTER — Telehealth: Payer: Self-pay | Admitting: Internal Medicine

## 2021-02-27 NOTE — Telephone Encounter (Signed)
Michaela Peterson has taken care of this.

## 2021-02-27 NOTE — Telephone Encounter (Signed)
PATIENT SAID SHE WAS RETURNING A CALL FROM THIS OFFICE, PLEASE CALL HER

## 2021-02-28 ENCOUNTER — Encounter: Payer: Self-pay | Admitting: *Deleted

## 2021-03-06 ENCOUNTER — Other Ambulatory Visit: Payer: Self-pay

## 2021-03-06 ENCOUNTER — Other Ambulatory Visit (HOSPITAL_COMMUNITY)
Admission: RE | Admit: 2021-03-06 | Discharge: 2021-03-06 | Disposition: A | Discharge status: Home / Self Care | Payer: Medicare Other | Source: Ambulatory Visit | Attending: Internal Medicine | Admitting: Internal Medicine

## 2021-03-06 DIAGNOSIS — K3184 Gastroparesis: Secondary | ICD-10-CM

## 2021-03-06 DIAGNOSIS — D649 Anemia, unspecified: Secondary | ICD-10-CM

## 2021-03-06 DIAGNOSIS — R6881 Early satiety: Secondary | ICD-10-CM | POA: Diagnosis not present

## 2021-03-06 DIAGNOSIS — R1319 Other dysphagia: Secondary | ICD-10-CM

## 2021-03-06 DIAGNOSIS — R1110 Vomiting, unspecified: Secondary | ICD-10-CM

## 2021-03-06 DIAGNOSIS — K219 Gastro-esophageal reflux disease without esophagitis: Secondary | ICD-10-CM

## 2021-03-06 LAB — BASIC METABOLIC PANEL
Anion gap: 7 (ref 5–15)
BUN: 18 mg/dL (ref 8–23)
CO2: 23 mmol/L (ref 22–32)
Calcium: 9.4 mg/dL (ref 8.9–10.3)
Chloride: 109 mmol/L (ref 98–111)
Creatinine, Ser: 1.1 mg/dL — ABNORMAL HIGH (ref 0.44–1.00)
GFR, Estimated: 56 mL/min — ABNORMAL LOW (ref >60)
Glucose, Bld: 155 mg/dL — ABNORMAL HIGH (ref 70–99)
Potassium: 3.7 mmol/L (ref 3.5–5.1)
Sodium: 139 mmol/L (ref 135–145)

## 2021-03-07 ENCOUNTER — Other Ambulatory Visit: Payer: Self-pay

## 2021-03-07 ENCOUNTER — Ambulatory Visit (HOSPITAL_COMMUNITY): Payer: Medicare Other | Admitting: Certified Registered"

## 2021-03-07 ENCOUNTER — Encounter (HOSPITAL_COMMUNITY)
Admission: RE | Disposition: A | Discharge status: Home / Self Care | Payer: Self-pay | Source: Home / Self Care | Attending: Internal Medicine

## 2021-03-07 ENCOUNTER — Encounter (HOSPITAL_COMMUNITY): Payer: Self-pay

## 2021-03-07 ENCOUNTER — Ambulatory Visit (HOSPITAL_COMMUNITY)
Admission: RE | Admit: 2021-03-07 | Discharge: 2021-03-07 | Disposition: A | Discharge status: Home / Self Care | Payer: Medicare Other | Attending: Internal Medicine | Admitting: Internal Medicine

## 2021-03-07 DIAGNOSIS — D649 Anemia, unspecified: Secondary | ICD-10-CM | POA: Diagnosis not present

## 2021-03-07 DIAGNOSIS — K3184 Gastroparesis: Secondary | ICD-10-CM | POA: Diagnosis not present

## 2021-03-07 DIAGNOSIS — R1310 Dysphagia, unspecified: Secondary | ICD-10-CM

## 2021-03-07 DIAGNOSIS — R12 Heartburn: Secondary | ICD-10-CM | POA: Insufficient documentation

## 2021-03-07 DIAGNOSIS — I10 Essential (primary) hypertension: Secondary | ICD-10-CM | POA: Diagnosis not present

## 2021-03-07 DIAGNOSIS — R6881 Early satiety: Secondary | ICD-10-CM | POA: Diagnosis not present

## 2021-03-07 DIAGNOSIS — Z6834 Body mass index (BMI) 34.0-34.9, adult: Secondary | ICD-10-CM | POA: Insufficient documentation

## 2021-03-07 DIAGNOSIS — R112 Nausea with vomiting, unspecified: Secondary | ICD-10-CM | POA: Diagnosis not present

## 2021-03-07 DIAGNOSIS — E119 Type 2 diabetes mellitus without complications: Secondary | ICD-10-CM | POA: Diagnosis not present

## 2021-03-07 DIAGNOSIS — G4730 Sleep apnea, unspecified: Secondary | ICD-10-CM | POA: Insufficient documentation

## 2021-03-07 DIAGNOSIS — K219 Gastro-esophageal reflux disease without esophagitis: Secondary | ICD-10-CM | POA: Diagnosis not present

## 2021-03-07 DIAGNOSIS — K2970 Gastritis, unspecified, without bleeding: Secondary | ICD-10-CM

## 2021-03-07 HISTORY — PX: BALLOON DILATION: SHX5330

## 2021-03-07 HISTORY — PX: ESOPHAGOGASTRODUODENOSCOPY (EGD) WITH PROPOFOL: SHX5813

## 2021-03-07 HISTORY — PX: BIOPSY: SHX5522

## 2021-03-07 LAB — GLUCOSE, CAPILLARY: Glucose-Capillary: 165 mg/dL — ABNORMAL HIGH (ref 70–99)

## 2021-03-07 SURGERY — ESOPHAGOGASTRODUODENOSCOPY (EGD) WITH PROPOFOL
Anesthesia: General

## 2021-03-07 MED ORDER — LIDOCAINE HCL (CARDIAC) PF 100 MG/5ML IV SOSY
PREFILLED_SYRINGE | INTRAVENOUS | Status: DC | PRN
  Administered 2021-03-07: 50 mg via INTRAVENOUS

## 2021-03-07 MED ORDER — LACTATED RINGERS IV SOLN
INTRAVENOUS | Status: DC | PRN
Start: 2021-03-07 — End: 2021-03-07

## 2021-03-07 MED ORDER — PROPOFOL 10 MG/ML IV BOLUS
INTRAVENOUS | Status: DC | PRN
  Administered 2021-03-07 (×2): 40 mg via INTRAVENOUS
  Administered 2021-03-07: 120 mg via INTRAVENOUS

## 2021-03-07 MED ORDER — LACTATED RINGERS IV SOLN: INTRAVENOUS | Status: DC

## 2021-03-07 NOTE — Anesthesia Postprocedure Evaluation (Signed)
Anesthesia Post Note  Patient: ROSABEL SERMENO  Procedure(s) Performed: ESOPHAGOGASTRODUODENOSCOPY (EGD) WITH PROPOFOL BALLOON DILATION BIOPSY  Patient location during evaluation: Phase II Anesthesia Type: General Level of consciousness: awake Pain management: pain level controlled Vital Signs Assessment: post-procedure vital signs reviewed and stable Respiratory status: spontaneous breathing and respiratory function stable Cardiovascular status: blood pressure returned to baseline and stable Postop Assessment: no headache and no apparent nausea or vomiting Anesthetic complications: no Comments: Late entry   No notable events documented.   Last Vitals:  Vitals:   03/07/21 1125 03/07/21 1130  BP: (!) 122/49 (!) 130/52  Pulse: 70 68  Resp: (!) 26 18  Temp: 36.8 C   SpO2: 93% 94%    Last Pain:  Vitals:   03/07/21 1130  TempSrc:   PainSc: 0-No pain                 Louann Sjogren

## 2021-03-07 NOTE — Anesthesia Procedure Notes (Signed)
Date/Time: 03/07/2021 11:17 AM Performed by: Orlie Dakin, CRNA Pre-anesthesia Checklist: Patient identified, Emergency Drugs available, Suction available and Patient being monitored Patient Re-evaluated:Patient Re-evaluated prior to induction Oxygen Delivery Method: Nasal cannula Induction Type: IV induction Placement Confirmation: positive ETCO2

## 2021-03-07 NOTE — Discharge Instructions (Addendum)
EGD Discharge instructions Please read the instructions outlined below and refer to this sheet in the next few weeks. These discharge instructions provide you with general information on caring for yourself after you leave the hospital. Your doctor may also give you specific instructions. While your treatment has been planned according to the most current medical practices available, unavoidable complications occasionally occur. If you have any problems or questions after discharge, please call your doctor. ACTIVITY You may resume your regular activity but move at a slower pace for the next 24 hours.  Take frequent rest periods for the next 24 hours.  Walking will help expel (get rid of) the air and reduce the bloated feeling in your abdomen.  No driving for 24 hours (because of the anesthesia (medicine) used during the test).  You may shower.  Do not sign any important legal documents or operate any machinery for 24 hours (because of the anesthesia used during the test).  NUTRITION Drink plenty of fluids.  You may resume your normal diet.  Begin with a light meal and progress to your normal diet.  Avoid alcoholic beverages for 24 hours or as instructed by your caregiver.  MEDICATIONS You may resume your normal medications unless your caregiver tells you otherwise.  WHAT YOU CAN EXPECT TODAY You may experience abdominal discomfort such as a feeling of fullness or gas pains.  FOLLOW-UP Your doctor will discuss the results of your test with you.  SEEK IMMEDIATE MEDICAL ATTENTION IF ANY OF THE FOLLOWING OCCUR: Excessive nausea (feeling sick to your stomach) and/or vomiting.  Severe abdominal pain and distention (swelling).  Trouble swallowing.  Temperature over 101 F (37.8 C).  Rectal bleeding or vomiting of blood.    Your EGD revealed mild amount inflammation in your stomach.  I took biopsies of this to rule out infection with a bacteria called H. pylori.  Await pathology results, my  office will contact you. You also had a slight narrowing of your esophagus which I stretched today. Hopefully this helps with your swallowing. Continue on Pantoprazole. Follow up with GI in 3-4 months.   I hope you have a great rest of your week!  Elon Alas. Abbey Chatters, D.O. Gastroenterology and Hepatology [facility]

## 2021-03-07 NOTE — Anesthesia Preprocedure Evaluation (Signed)
Anesthesia Evaluation  Patient identified by MRN, date of birth, ID band Patient awake    Reviewed: Allergy & Precautions, H&P , NPO status , Patient's Chart, lab work & pertinent test results, reviewed documented beta blocker date and time   Airway Mallampati: II  TM Distance: >3 FB Neck ROM: full    Dental no notable dental hx.    Pulmonary shortness of breath, sleep apnea ,    Pulmonary exam normal breath sounds clear to auscultation       Cardiovascular Exercise Tolerance: Good hypertension, negative cardio ROS   Rhythm:regular Rate:Normal     Neuro/Psych  Headaches,  Neuromuscular disease negative psych ROS   GI/Hepatic Neg liver ROS, GERD  Medicated,  Endo/Other  diabetes, Type 2Morbid obesity  Renal/GU negative Renal ROS  negative genitourinary   Musculoskeletal   Abdominal   Peds  Hematology  (+) Blood dyscrasia, anemia ,   Anesthesia Other Findings   Reproductive/Obstetrics negative OB ROS                             Anesthesia Physical Anesthesia Plan  ASA: 3  Anesthesia Plan: General   Post-op Pain Management:    Induction:   PONV Risk Score and Plan: Propofol infusion  Airway Management Planned:   Additional Equipment:   Intra-op Plan:   Post-operative Plan:   Informed Consent: I have reviewed the patients History and Physical, chart, labs and discussed the procedure including the risks, benefits and alternatives for the proposed anesthesia with the patient or authorized representative who has indicated his/her understanding and acceptance.     Dental Advisory Given  Plan Discussed with: CRNA  Anesthesia Plan Comments:         Anesthesia Quick Evaluation

## 2021-03-07 NOTE — Transfer of Care (Signed)
Immediate Anesthesia Transfer of Care Note  Patient: Michaela Peterson  Procedure(s) Performed: ESOPHAGOGASTRODUODENOSCOPY (EGD) WITH PROPOFOL BALLOON DILATION BIOPSY  Patient Location: Endoscopy Unit  Anesthesia Type:General  Level of Consciousness: drowsy  Airway & Oxygen Therapy: Patient Spontanous Breathing  Post-op Assessment: Report given to RN and Post -op Vital signs reviewed and stable  Post vital signs: Reviewed and stable  Last Vitals:  Vitals Value Taken Time  BP    Temp    Pulse    Resp    SpO2      Last Pain:  Vitals:   03/07/21 1049  TempSrc: Oral  PainSc: 0-No pain      Patients Stated Pain Goal: 9 (45/14/60 4799)  Complications: No notable events documented.

## 2021-03-07 NOTE — Op Note (Signed)
[facility] Patient Name: Michaela Peterson Procedure Date: 03/07/2021 11:01 AM MRN: [id number] Date of Birth: November 22, 1955 Attending MD: Elon Alas. Abbey Chatters DO CSN: [id number] Age: 65 Admit Type: Outpatient Procedure:                Upper GI endoscopy Indications:              Dysphagia, Heartburn, Nausea with vomiting Providers:                Elon Alas. Abbey Chatters, DO, Lambert Mody, Aram Candela Referring MD:              Medicines:                See the Anesthesia note for documentation of the                            administered medications Complications:            No immediate complications. Estimated Blood Loss:     Estimated blood loss was minimal. Procedure:                Pre-Anesthesia Assessment:                           - The anesthesia plan was to use monitored                            anesthesia care (MAC).                           After obtaining informed consent, the endoscope was                            passed under direct vision. Throughout the                            procedure, the patient's blood pressure, pulse, and                            oxygen saturations were monitored continuously. The                            GIF-H190 ([id number]) scope was introduced through the                            mouth, and advanced to the second part of duodenum.                            The upper GI endoscopy was accomplished without                            difficulty. The patient tolerated the procedure                            well. Scope In: 11:16:10 AM  Scope Out: 11:20:43 AM Total Procedure Duration: 0 hours 4 minutes 33 seconds  Findings:      There is no endoscopic evidence of Barrett's esophagus, bleeding,       esophagitis, inflammation, ulcerations or varices in the entire       esophagus.      No endoscopic abnormality was evident in the esophagus to explain the       patient's complaint of dysphagia. Preparations  were made for empiric       dilation. A TTS dilator was passed through the scope. Dilation with an       18-19-20 mm balloon dilator was performed to 20 mm. Dilation was       performed with a mild resistance at 20 mm. Estimated blood loss was none.      Patchy mild inflammation characterized by erythema was found in the       gastric body. Biopsies were taken with a cold forceps for Helicobacter       pylori testing.      The duodenal bulb, first portion of the duodenum and second portion of       the duodenum were normal. Impression:               - Gastritis. Biopsied.                           - Normal duodenal bulb, first portion of the                            duodenum and second portion of the duodenum. Moderate Sedation:      Per Anesthesia Care Recommendation:           - Patient has a contact number available for                            emergencies. The signs and symptoms of potential                            delayed complications were discussed with the                            patient. Return to normal activities tomorrow.                            Written discharge instructions were provided to the                            patient.                           - Resume previous diet.                           - Continue present medications.                           - Await pathology results.                           - Repeat upper endoscopy  PRN for retreatment.                           - Return to GI clinic in 4 months.                           - Use Protonix (pantoprazole) 40 mg PO daily. Procedure Code(s):        --- Professional ---                           [phone], Esophagogastroduodenoscopy, flexible,                            transoral; with biopsy, single or multiple Diagnosis Code(s):        --- Professional ---                           K29.70, Gastritis, unspecified, without bleeding                           R13.10, Dysphagia, unspecified                            R12, Heartburn                           R11.2, Nausea with vomiting, unspecified CPT copyright 2019 American Medical Association. All rights reserved. The codes documented in this report are preliminary and upon coder review may  be revised to meet current compliance requirements. Elon Alas. Abbey Chatters, DO [place] Abbey Chatters, DO 03/07/2021 11:25:02 AM This report has been signed electronically. Number of Addenda: 0

## 2021-03-07 NOTE — Interval H&P Note (Signed)
History and Physical Interval Note:  03/07/2021 10:47 AM  Michaela Peterson  has presented today for surgery, with the diagnosis of dysphagia, nausea, vomiting, early satiety, anemia, gastroparesis, gerd.  The various methods of treatment have been discussed with the patient and family. After consideration of risks, benefits and other options for treatment, the patient has consented to  Procedure(s) with comments: ESOPHAGOGASTRODUODENOSCOPY (EGD) WITH PROPOFOL (N/A) - 12:00pm BALLOON DILATION (N/A) as a surgical intervention.  The patient's history has been reviewed, patient examined, no change in status, stable for surgery.  I have reviewed the patient's chart and labs.  Questions were answered to the patient's satisfaction.     [name]

## 2021-03-09 LAB — SURGICAL PATHOLOGY

## 2021-03-10 ENCOUNTER — Encounter (HOSPITAL_COMMUNITY): Payer: Self-pay | Admitting: Internal Medicine

## 2021-03-15 ENCOUNTER — Encounter: Payer: Self-pay | Admitting: Family Medicine

## 2021-03-15 ENCOUNTER — Ambulatory Visit (INDEPENDENT_AMBULATORY_CARE_PROVIDER_SITE_OTHER): Payer: Medicare Other | Admitting: Family Medicine

## 2021-03-15 ENCOUNTER — Other Ambulatory Visit: Payer: Self-pay

## 2021-03-15 DIAGNOSIS — J209 Acute bronchitis, unspecified: Secondary | ICD-10-CM

## 2021-03-15 DIAGNOSIS — J329 Chronic sinusitis, unspecified: Secondary | ICD-10-CM | POA: Insufficient documentation

## 2021-03-15 DIAGNOSIS — J440 Chronic obstructive pulmonary disease with acute lower respiratory infection: Secondary | ICD-10-CM | POA: Diagnosis not present

## 2021-03-15 DIAGNOSIS — J989 Respiratory disorder, unspecified: Secondary | ICD-10-CM | POA: Diagnosis not present

## 2021-03-15 DIAGNOSIS — J019 Acute sinusitis, unspecified: Secondary | ICD-10-CM

## 2021-03-15 MED ORDER — SULFAMETHOXAZOLE-TRIMETHOPRIM 800-160 MG PO TABS: 1.0000 | ORAL_TABLET | Freq: Two times a day (BID) | ORAL | 0 refills | Status: DC

## 2021-03-15 MED ORDER — BENZONATATE 100 MG PO CAPS: 100.0000 mg | ORAL_CAPSULE | Freq: Two times a day (BID) | ORAL | 0 refills | Status: DC | PRN

## 2021-03-15 NOTE — Assessment & Plan Note (Addendum)
covid swab today and flu swab

## 2021-03-15 NOTE — Progress Notes (Signed)
Virtual Visit via Telephone Note  I connected with Michaela Peterson on 03/15/21 at  9:40 AM EST by telephone and verified that I am speaking with the correct person using two identifiers.  Location: Patient: home Provider: office   I discussed the limitations, risks, security and privacy concerns of performing an evaluation and management service by telephone and the availability of in person appointments. I also discussed with the patient that there may be a patient responsible charge related to this service. The patient expressed understanding and agreed to proceed.   History of Present Illness: Chills, green nasal drainage and coughing up green sputum, , left shoulder pain since EgD  last Tuesday, husband also sick he is covid negative   Observations/Objective: LMP 08/18/2012  Good communication with no confusion and intact memory. Alert and oriented x 3 Nasal congestion and cough present   Assessment and Plan: Respiratory illness covid swab today and flu swab  Sinusitis Septra prescribed  Acute bronchitis with COPD ([place]) Antibiotic prescribed   Follow Up Instructions:    I discussed the assessment and treatment plan with the patient. The patient was provided an opportunity to ask questions and all were answered. The patient agreed with the plan and demonstrated an understanding of the instructions.   The patient was advised to call back or seek an in-person evaluation if the symptoms worsen or if the condition fails to improve as anticipated.  I provided 13 minutes of non-face-to-face time during this encounter.   Tula Nakayama, MD

## 2021-03-15 NOTE — Patient Instructions (Signed)
F/U as before, call if you need me sooner  You are treated for acute sinusitis and bronchitis, septra and tessalon perles are prescribed  Come to office this morning for testing for covid and influenza , please wait for influenza result,   Fluids , rest and take medication AS PESCRIBED

## 2021-03-16 ENCOUNTER — Other Ambulatory Visit: Payer: Self-pay

## 2021-03-16 ENCOUNTER — Telehealth: Payer: Self-pay

## 2021-03-16 ENCOUNTER — Ambulatory Visit: Payer: Medicare Other

## 2021-03-16 DIAGNOSIS — J019 Acute sinusitis, unspecified: Secondary | ICD-10-CM | POA: Diagnosis not present

## 2021-03-16 LAB — POCT INFLUENZA A/B
Influenza A, POC: NEGATIVE
Influenza B, POC: NEGATIVE

## 2021-03-16 NOTE — Telephone Encounter (Signed)
Pt called for results of flu test.  I gave her the negative results and she asked if she should change treatment? I advised her to continue current plan unless we call her to inform her otherwise.

## 2021-03-16 NOTE — Assessment & Plan Note (Signed)
Septra prescribed 

## 2021-03-16 NOTE — Telephone Encounter (Signed)
Patient called for lab results. Call back # [phone]

## 2021-03-16 NOTE — Assessment & Plan Note (Signed)
Antibiotic prescribed 

## 2021-03-17 NOTE — Telephone Encounter (Signed)
Pt advised with verbal understanding  °

## 2021-03-18 LAB — NOVEL CORONAVIRUS, NAA

## 2021-03-24 ENCOUNTER — Other Ambulatory Visit: Payer: Self-pay

## 2021-03-24 ENCOUNTER — Encounter: Payer: Self-pay | Admitting: Family Medicine

## 2021-03-24 ENCOUNTER — Ambulatory Visit (INDEPENDENT_AMBULATORY_CARE_PROVIDER_SITE_OTHER): Payer: Medicare Other | Admitting: Family Medicine

## 2021-03-24 VITALS — BP 150/70 | HR 90 | Ht 60.0 in | Wt 180.1 lb

## 2021-03-24 DIAGNOSIS — E1149 Type 2 diabetes mellitus with other diabetic neurological complication: Secondary | ICD-10-CM | POA: Diagnosis not present

## 2021-03-24 DIAGNOSIS — I10 Essential (primary) hypertension: Secondary | ICD-10-CM

## 2021-03-24 DIAGNOSIS — J309 Allergic rhinitis, unspecified: Secondary | ICD-10-CM | POA: Diagnosis not present

## 2021-03-24 DIAGNOSIS — E669 Obesity, unspecified: Secondary | ICD-10-CM

## 2021-03-24 DIAGNOSIS — K219 Gastro-esophageal reflux disease without esophagitis: Secondary | ICD-10-CM | POA: Diagnosis not present

## 2021-03-24 DIAGNOSIS — E785 Hyperlipidemia, unspecified: Secondary | ICD-10-CM

## 2021-03-24 NOTE — Patient Instructions (Addendum)
F/U in 6 weeks, call if you need me before  Fasting lipid, cmp and EGFR, HBA!C  04/03/2021 or shortly after  You do NEED to take olmesartan as prescribed , if you cannot tolerate please call and let me know, your bP is too high without it  Thanks for choosing [facility], we consider it a privelige to serve you.   Need shingrix vaccines, please get these

## 2021-03-30 ENCOUNTER — Other Ambulatory Visit: Payer: Self-pay | Admitting: Family Medicine

## 2021-04-01 ENCOUNTER — Encounter: Payer: Self-pay | Admitting: Family Medicine

## 2021-04-01 NOTE — Assessment & Plan Note (Signed)
Controlled, no change in medication  

## 2021-04-01 NOTE — Assessment & Plan Note (Signed)
Hyperlipidemia:Low fat diet discussed and encouraged.   Lipid Panel  Lab Results  Component Value Date   CHOL 157 03/25/2020   HDL 57 03/25/2020   LDLCALC 88 03/25/2020   TRIG 60 03/25/2020   CHOLHDL 2.8 03/25/2020   Controlled, no change in medication

## 2021-04-01 NOTE — Assessment & Plan Note (Signed)
°  Patient re-educated about  the importance of commitment to a  minimum of 150 minutes of exercise per week as able.  The importance of healthy food choices with portion control discussed, as well as eating regularly and within a 12 hour window most days. The need to choose "clean , green" food 50 to 75% of the time is discussed, as well as to make water the primary drink and set a goal of 64 ounces water daily.    Weight /BMI 03/24/2021 03/07/2021 02/17/2021  WEIGHT 180 lb 1.3 oz 183 lb 183 lb  HEIGHT 5\' 0"  5\' 1"  5\' 1"   BMI 35.17 kg/m2 34.58 kg/m2 34.58 kg/m2

## 2021-04-01 NOTE — Progress Notes (Signed)
JABREE PERNICE     MRN: [id number]      DOB: 1956-01-30   HPI Ms. Shisler is here for follow up and re-evaluation of  acute respiratory illness and chronic medical conditions, medication management and review of any available recent lab and radiology data.  Preventive health is updated, specifically  Cancer screening and Immunization.   Currently stressed as spouse has been in the hospital up to a few days ago, reports forgetting y to take some of her meds as a result Denies polyuria, polydipsia, blurred vision , or hypoglycemic episodes. Reports resolution of sinus pressure, drainage and productive cough    ROS Denies recent fever or chills. Denies sinus pressure, nasal congestion, ear pain or sore throat. Denies chest congestion, productive cough or wheezing. Denies chest pains, palpitations and leg swelling Denies abdominal pain, nausea, vomiting,diarrhea or constipation.   Denies dysuria, frequency, hesitancy c/o stress incontinence. Denies uncontrolled joint pain, swelling and limitation in mobility. Denies headaches, seizures, numbness, or tingling. nia. Denies skin break down or rash.   PE  BP (!) 150/70    Pulse 90    Ht 5' (1.524 m)    Wt 180 lb 1.3 oz (81.7 kg)    LMP 08/18/2012    SpO2 96%    BMI 35.17 kg/m   Patient alert and oriented and in no cardiopulmonary distress.  HEENT: No facial asymmetry, EOMI,     Neck supple .  Chest: Clear to auscultation bilaterally.  CVS: S1, S2 no murmurs, no S3.Regular rate.  ABD: Soft non tender.   Ext: No edema  MS: Adequate ROM spine, shoulders, hips and knees.  Skin: Intact, no ulcerations or rash noted.  Psych: Good eye contact, normal affect. Memory intact not anxious or depressed appearing.  CNS: CN 2-12 intact, power,  normal throughout.no focal deficits noted.   Assessment & Plan  Hypertension goal BP (blood pressure) < 140/90 DASH diet and commitment to daily physical activity for a minimum of 30 minutes  discussed and encouraged, as a part of hypertension management. The importance of attaining a healthy weight is also discussed.  BP/Weight 03/24/2021 03/07/2021 02/17/2021 02/10/2021 01/03/2021 12/21/2020 3/71/0626  Systolic BP 948 546 270 350 093 - 818  Diastolic BP 70 52 76 76 78 - 77  Wt. (Lbs) 180.08 183 183 180.8 181.12 179 179.12  BMI 35.17 34.58 34.58 34.16 34.22 33.82 33.84  uncontrolled needs additional medication     Allergic rhinitis Controlled, no change in medication   Gastroesophageal reflux disease Controlled, no change in medication   Obesity (BMI 30.0-34.9)  Patient re-educated about  the importance of commitment to a  minimum of 150 minutes of exercise per week as able.  The importance of healthy food choices with portion control discussed, as well as eating regularly and within a 12 hour window most days. The need to choose "clean , green" food 50 to 75% of the time is discussed, as well as to make water the primary drink and set a goal of 64 ounces water daily.    Weight /BMI 03/24/2021 03/07/2021 02/17/2021  WEIGHT 180 lb 1.3 oz 183 lb 183 lb  HEIGHT 5\' 0"  5\' 1"  5\' 1"   BMI 35.17 kg/m2 34.58 kg/m2 34.58 kg/m2      Type 2 diabetes mellitus with neurological complications [facility]) Ms. Hoque is reminded of the importance of commitment to daily physical activity for 30 minutes or more, as able and the need to limit carbohydrate intake to 30 to 60  grams per meal to help with blood sugar control.   The need to take medication as prescribed, test blood sugar as directed, and to call between visits if there is a concern that blood sugar is uncontrolled is also discussed.   Ms. Begin is reminded of the importance of daily foot exam, annual eye examination, and good blood sugar, blood pressure and cholesterol control. Not at goal, needs to improve Updated lab needed at/ before next visit.   Diabetic Labs Latest Ref Rng & Units 03/06/2021 12/29/2020 12/16/2020 11/08/2020  08/24/2020  HbA1c 4.8 - 5.6 % - 7.5(H) - - 7.4(H)  Microalbumin mg/dL - - - - -  Micro/Creat Ratio 0 - 29 mg/g creat - - - - 172(H)  Chol 100 - 199 mg/dL - - - - -  HDL >39 mg/dL - - - - -  Calc LDL 0 - 99 mg/dL - - - - -  Triglycerides 0 - 149 mg/dL - - - - -  Creatinine 0.44 - 1.00 mg/dL 1.10(H) - 1.00 1.01(H) 1.05(H)   BP/Weight 03/24/2021 03/07/2021 02/17/2021 02/10/2021 01/03/2021 12/21/2020 3/32/9518  Systolic BP 841 660 630 160 109 - 323  Diastolic BP 70 52 76 76 78 - 77  Wt. (Lbs) 180.08 183 183 180.8 181.12 179 179.12  BMI 35.17 34.58 34.58 34.16 34.22 33.82 33.84   Foot/eye exam completion dates Latest Ref Rng & Units 02/22/2021 08/31/2020  Eye Exam No Retinopathy No Retinopathy -  Foot Form Completion - - Done        Hyperlipidemia LDL goal <100 Hyperlipidemia:Low fat diet discussed and encouraged.   Lipid Panel  Lab Results  Component Value Date   CHOL 157 03/25/2020   HDL 57 03/25/2020   LDLCALC 88 03/25/2020   TRIG 60 03/25/2020   CHOLHDL 2.8 03/25/2020   Controlled, no change in medication

## 2021-04-01 NOTE — Assessment & Plan Note (Signed)
Michaela Peterson is reminded of the importance of commitment to daily physical activity for 30 minutes or more, as able and the need to limit carbohydrate intake to 30 to 60 grams per meal to help with blood sugar control.   The need to take medication as prescribed, test blood sugar as directed, and to call between visits if there is a concern that blood sugar is uncontrolled is also discussed.   Michaela Peterson is reminded of the importance of daily foot exam, annual eye examination, and good blood sugar, blood pressure and cholesterol control. Not at goal, needs to improve Updated lab needed at/ before next visit.   Diabetic Labs Latest Ref Rng & Units 03/06/2021 12/29/2020 12/16/2020 11/08/2020 08/24/2020  HbA1c 4.8 - 5.6 % - 7.5(H) - - 7.4(H)  Microalbumin mg/dL - - - - -  Micro/Creat Ratio 0 - 29 mg/g creat - - - - 172(H)  Chol 100 - 199 mg/dL - - - - -  HDL >39 mg/dL - - - - -  Calc LDL 0 - 99 mg/dL - - - - -  Triglycerides 0 - 149 mg/dL - - - - -  Creatinine 0.44 - 1.00 mg/dL 1.10(H) - 1.00 1.01(H) 1.05(H)   BP/Weight 03/24/2021 03/07/2021 02/17/2021 02/10/2021 01/03/2021 12/21/2020 5/37/4827  Systolic BP 078 675 449 201 007 - 121  Diastolic BP 70 52 76 76 78 - 77  Wt. (Lbs) 180.08 183 183 180.8 181.12 179 179.12  BMI 35.17 34.58 34.58 34.16 34.22 33.82 33.84   Foot/eye exam completion dates Latest Ref Rng & Units 02/22/2021 08/31/2020  Eye Exam No Retinopathy No Retinopathy -  Foot Form Completion - - Done

## 2021-04-01 NOTE — Assessment & Plan Note (Signed)
DASH diet and commitment to daily physical activity for a minimum of 30 minutes discussed and encouraged, as a part of hypertension management. The importance of attaining a healthy weight is also discussed.  BP/Weight 03/24/2021 03/07/2021 02/17/2021 02/10/2021 01/03/2021 12/21/2020 4/64/3142  Systolic BP 767 011 003 496 116 - 435  Diastolic BP 70 52 76 76 78 - 77  Wt. (Lbs) 180.08 183 183 180.8 181.12 179 179.12  BMI 35.17 34.58 34.58 34.16 34.22 33.82 33.84  uncontrolled needs additional medication

## 2021-04-07 DIAGNOSIS — I10 Essential (primary) hypertension: Secondary | ICD-10-CM | POA: Diagnosis not present

## 2021-04-07 DIAGNOSIS — E1149 Type 2 diabetes mellitus with other diabetic neurological complication: Secondary | ICD-10-CM | POA: Diagnosis not present

## 2021-04-07 DIAGNOSIS — E785 Hyperlipidemia, unspecified: Secondary | ICD-10-CM | POA: Diagnosis not present

## 2021-04-08 LAB — CMP14+EGFR
ALT: 20 IU/L (ref 0–32)
AST: 15 IU/L (ref 0–40)
Albumin/Globulin Ratio: 1.6 (ref 1.2–2.2)
Albumin: 4.7 g/dL (ref 3.8–4.8)
Alkaline Phosphatase: 110 IU/L (ref 44–121)
BUN/Creatinine Ratio: 21 (ref 12–28)
BUN: 24 mg/dL (ref 8–27)
Bilirubin Total: 0.2 mg/dL (ref 0.0–1.2)
CO2: 20 mmol/L (ref 20–29)
Calcium: 10 mg/dL (ref 8.7–10.3)
Chloride: 108 mmol/L — ABNORMAL HIGH (ref 96–106)
Creatinine, Ser: 1.13 mg/dL — ABNORMAL HIGH (ref 0.57–1.00)
Globulin, Total: 3 g/dL (ref 1.5–4.5)
Glucose: 128 mg/dL — ABNORMAL HIGH (ref 70–99)
Potassium: 4.8 mmol/L (ref 3.5–5.2)
Sodium: 142 mmol/L (ref 134–144)
Total Protein: 7.7 g/dL (ref 6.0–8.5)
eGFR: 54 mL/min/{1.73_m2} — ABNORMAL LOW (ref >59)

## 2021-04-08 LAB — LIPID PANEL
Chol/HDL Ratio: 2.6 ratio (ref 0.0–4.4)
Cholesterol, Total: 166 mg/dL (ref 100–199)
HDL: 63 mg/dL (ref >39)
LDL Chol Calc (NIH): 91 mg/dL (ref 0–99)
Triglycerides: 63 mg/dL (ref 0–149)
VLDL Cholesterol Cal: 12 mg/dL (ref 5–40)

## 2021-04-08 LAB — HEMOGLOBIN A1C
Est. average glucose Bld gHb Est-mCnc: 197 mg/dL
Hgb A1c MFr Bld: 8.5 % — ABNORMAL HIGH (ref 4.8–5.6)

## 2021-05-01 DIAGNOSIS — L110 Acquired keratosis follicularis: Secondary | ICD-10-CM | POA: Diagnosis not present

## 2021-05-01 DIAGNOSIS — I739 Peripheral vascular disease, unspecified: Secondary | ICD-10-CM | POA: Diagnosis not present

## 2021-05-01 DIAGNOSIS — M79671 Pain in right foot: Secondary | ICD-10-CM | POA: Diagnosis not present

## 2021-05-01 DIAGNOSIS — M79674 Pain in right toe(s): Secondary | ICD-10-CM | POA: Diagnosis not present

## 2021-05-01 DIAGNOSIS — M79675 Pain in left toe(s): Secondary | ICD-10-CM | POA: Diagnosis not present

## 2021-05-01 DIAGNOSIS — E1140 Type 2 diabetes mellitus with diabetic neuropathy, unspecified: Secondary | ICD-10-CM | POA: Diagnosis not present

## 2021-05-01 DIAGNOSIS — M79672 Pain in left foot: Secondary | ICD-10-CM | POA: Diagnosis not present

## 2021-05-04 ENCOUNTER — Other Ambulatory Visit: Payer: Self-pay

## 2021-05-04 ENCOUNTER — Ambulatory Visit (INDEPENDENT_AMBULATORY_CARE_PROVIDER_SITE_OTHER): Payer: Medicare Other | Admitting: Family Medicine

## 2021-05-04 ENCOUNTER — Encounter: Payer: Self-pay | Admitting: Family Medicine

## 2021-05-04 VITALS — BP 143/82 | HR 86 | Ht 60.0 in | Wt 180.0 lb

## 2021-05-04 DIAGNOSIS — Z79899 Other long term (current) drug therapy: Secondary | ICD-10-CM

## 2021-05-04 DIAGNOSIS — I10 Essential (primary) hypertension: Secondary | ICD-10-CM

## 2021-05-04 DIAGNOSIS — R112 Nausea with vomiting, unspecified: Secondary | ICD-10-CM | POA: Diagnosis not present

## 2021-05-04 DIAGNOSIS — E1149 Type 2 diabetes mellitus with other diabetic neurological complication: Secondary | ICD-10-CM | POA: Diagnosis not present

## 2021-05-04 DIAGNOSIS — E785 Hyperlipidemia, unspecified: Secondary | ICD-10-CM | POA: Diagnosis not present

## 2021-05-04 MED ORDER — ONDANSETRON HCL 4 MG/2ML IJ SOLN
4.0000 mg | Freq: Once | INTRAMUSCULAR | Status: AC
  Administered 2021-05-04: 4 mg via INTRAMUSCULAR

## 2021-05-04 MED ORDER — ONDANSETRON HCL 4 MG PO TABS: 4.0000 mg | ORAL_TABLET | Freq: Three times a day (TID) | ORAL | 0 refills | Status: DC | PRN

## 2021-05-04 NOTE — Patient Instructions (Addendum)
F/U in April 30 or after   Call if you need me sooner  Please give pt book to record blood sugar  Test and record everyday fasting sugar, goal range is 80 to 130  Zofran 4 mg IM given for nausea and limited number of tablets sent in to [place]  Non fasting cmp and eGFr and hBA1C to be done 4/27 or shortly after at least 3 days before next appointment  Modify meds as discussed , just take 1 multivitamin with iron and vit d  Thanks for choosing [facility], we consider it a privelige to serve you.

## 2021-05-08 ENCOUNTER — Encounter: Payer: Self-pay | Admitting: Family Medicine

## 2021-05-08 DIAGNOSIS — Z79899 Other long term (current) drug therapy: Secondary | ICD-10-CM | POA: Insufficient documentation

## 2021-05-08 NOTE — Assessment & Plan Note (Addendum)
Unchanged Patient re-educated about  the importance of commitment to a  minimum of 150 minutes of exercise per week as able.  The importance of healthy food choices with portion control discussed, as well as eating regularly and within a 12 hour window most days. The need to choose "clean , green" food 50 to 75% of the time is discussed, as well as to make water the primary drink and set a goal of 64 ounces water daily.    Weight /BMI 05/04/2021 03/24/2021 03/07/2021  WEIGHT 180 lb 180 lb 1.3 oz 183 lb  HEIGHT 5\' 0"  5\' 0"  5\' 1"   BMI 35.15 kg/m2 35.17 kg/m2 34.58 kg/m2

## 2021-05-08 NOTE — Assessment & Plan Note (Signed)
DASH diet and commitment to daily physical activity for a minimum of 30 minutes discussed and encouraged, as a part of hypertension management. The importance of attaining a healthy weight is also discussed. Elevated at visit , no med change  BP/Weight 05/04/2021 03/24/2021 03/07/2021 02/17/2021 02/10/2021 01/03/2021 90/22/8406  Systolic BP 986 148 307 354 301 484 -  Diastolic BP 82 70 52 76 76 78 -  Wt. (Lbs) 180 180.08 183 183 180.8 181.12 179  BMI 35.15 35.17 34.58 34.58 34.16 34.22 33.82

## 2021-05-08 NOTE — Assessment & Plan Note (Signed)
Entire med cabinet brought to office , many meds removed and those she will commit to are as listed

## 2021-05-08 NOTE — Progress Notes (Signed)
Michaela Peterson     MRN: [id number]      DOB: Oct 07, 1955   HPI Michaela Peterson is here for follow up and re-evaluation of chronic medical conditions, medication management and review of any available recent lab and radiology data.  Preventive health is updated, specifically  Cancer screening and Immunization.   Blood sugar remains uncontrolled with elevated blood sugars. The PT denies any adverse reactions to current medications since the last visit.  Nausea in the past 30 minutes severe to vomiting in the office No fever, chills or loose stool  ROS Denies recent fever or chills. Denies sinus pressure, nasal congestion, ear pain or sore throat. Denies chest congestion, productive cough or wheezing. Denies chest pains, palpitations and leg swelling .   Denies dysuria, frequency, hesitancy or incontinence. Denies joint pain, swelling and limitation in mobility. Denies headaches, seizures, numbness, or tingling. Denies depression, anxiety or insomnia. Denies skin break down or rash.   PE  BP (!) 143/82 (BP Location: Right Arm, Patient Position: Sitting, Cuff Size: Normal)    Pulse 86    Ht 5' (1.524 m)    Wt 180 lb (81.6 kg)    LMP 08/18/2012    SpO2 99%    BMI 35.15 kg/m   Patient alert and oriented and in no cardiopulmonary distress.  HEENT: No facial asymmetry, EOMI,     Neck supple .  Chest: Clear to auscultation bilaterally.  CVS: S1, S2 no murmurs, no S3.Regular rate.  ABD: Soft non tender.   Ext: No edema  MS: Adequate ROM spine, shoulders, hips and knees.  Skin: Intact, no ulcerations or rash noted.  Psych: Good eye contact, normal affect. Memory intact not anxious or depressed appearing.  CNS: CN 2-12 intact, power,  normal throughout.no focal deficits noted.   Assessment & Plan  Nausea and vomiting Acute episode during visit, states she took all of her meds on empty stomach, zofran 4 mg Im in office  Hypertension goal BP (blood pressure) < 140/90 DASH diet and  commitment to daily physical activity for a minimum of 30 minutes discussed and encouraged, as a part of hypertension management. The importance of attaining a healthy weight is also discussed. Elevated at visit , no med change  BP/Weight 05/04/2021 03/24/2021 03/07/2021 02/17/2021 02/10/2021 01/03/2021 40/98/1191  Systolic BP 478 295 621 308 657 846 -  Diastolic BP 82 70 52 76 76 78 -  Wt. (Lbs) 180 180.08 183 183 180.8 181.12 179  BMI 35.15 35.17 34.58 34.58 34.16 34.22 33.82       Hyperlipidemia LDL goal <100 Hyperlipidemia:Low fat diet discussed and encouraged.   Lipid Panel  Lab Results  Component Value Date   CHOL 166 04/07/2021   HDL 63 04/07/2021   LDLCALC 91 04/07/2021   TRIG 63 04/07/2021   CHOLHDL 2.6 04/07/2021     Controlled, no change in medication   Morbid obesity ([place]) Unchanged Patient re-educated about  the importance of commitment to a  minimum of 150 minutes of exercise per week as able.  The importance of healthy food choices with portion control discussed, as well as eating regularly and within a 12 hour window most days. The need to choose "clean , green" food 50 to 75% of the time is discussed, as well as to make water the primary drink and set a goal of 64 ounces water daily.    Weight /BMI 05/04/2021 03/24/2021 03/07/2021  WEIGHT 180 lb 180 lb 1.3 oz 183 lb  HEIGHT  5\' 0"  5\' 0"  5\' 1"   BMI 35.15 kg/m2 35.17 kg/m2 34.58 kg/m2      Medication management Entire med cabinet brought to office , many meds removed and those she will commit to are as listed

## 2021-05-08 NOTE — Assessment & Plan Note (Signed)
Hyperlipidemia:Low fat diet discussed and encouraged.   Lipid Panel  Lab Results  Component Value Date   CHOL 166 04/07/2021   HDL 63 04/07/2021   LDLCALC 91 04/07/2021   TRIG 63 04/07/2021   CHOLHDL 2.6 04/07/2021     Controlled, no change in medication

## 2021-05-08 NOTE — Assessment & Plan Note (Signed)
Acute episode during visit, states she took all of her meds on empty stomach, zofran 4 mg Im in office

## 2021-05-16 DIAGNOSIS — G4733 Obstructive sleep apnea (adult) (pediatric): Secondary | ICD-10-CM | POA: Diagnosis not present

## 2021-05-18 ENCOUNTER — Telehealth: Payer: Self-pay

## 2021-05-18 ENCOUNTER — Telehealth: Payer: Self-pay | Admitting: Internal Medicine

## 2021-05-18 NOTE — Telephone Encounter (Signed)
Patient called and wants to switch from [place] to [facility] office for convenience. She states it is getting harder for to make it to appts in [place]. made patient a new patient appt with Dr. Elsworth Soho next available. Made pt appt for 08/02/21 in [place]. Patient would like to know if Dr. Annamaria Boots would be okay with her cancelling f/u in April with him and addressing f/u concerns with Dr. Elsworth Soho at new patient appt.  ? ?Dr. Annamaria Boots please advise. Thanks!  ?

## 2021-05-18 NOTE — Telephone Encounter (Signed)
Certainly fine to transfer. Dr Elsworth Soho will take good care of her.  ?

## 2021-05-18 NOTE — Telephone Encounter (Signed)
Called and notified patient of Dr. Bertrum Sol response. Appt cancelled for [facility] nothing further needed ?

## 2021-05-25 ENCOUNTER — Other Ambulatory Visit: Payer: Self-pay

## 2021-05-25 MED ORDER — ONETOUCH ULTRA VI STRP: ORAL_STRIP | 3 refills | Status: DC

## 2021-06-20 ENCOUNTER — Ambulatory Visit: Payer: Medicare Other | Admitting: Internal Medicine

## 2021-06-20 NOTE — Telephone Encounter (Signed)
Noted     Closing encounter

## 2021-06-26 DIAGNOSIS — I10 Essential (primary) hypertension: Secondary | ICD-10-CM | POA: Diagnosis not present

## 2021-06-26 DIAGNOSIS — E1149 Type 2 diabetes mellitus with other diabetic neurological complication: Secondary | ICD-10-CM | POA: Diagnosis not present

## 2021-06-27 LAB — CMP14+EGFR
ALT: 16 IU/L (ref 0–32)
AST: 13 IU/L (ref 0–40)
Albumin/Globulin Ratio: 1.6 (ref 1.2–2.2)
Albumin: 4.6 g/dL (ref 3.8–4.8)
Alkaline Phosphatase: 97 IU/L (ref 44–121)
BUN/Creatinine Ratio: 24 (ref 12–28)
BUN: 23 mg/dL (ref 8–27)
Bilirubin Total: 0.3 mg/dL (ref 0.0–1.2)
CO2: 20 mmol/L (ref 20–29)
Calcium: 10 mg/dL (ref 8.7–10.3)
Chloride: 109 mmol/L — ABNORMAL HIGH (ref 96–106)
Creatinine, Ser: 0.96 mg/dL (ref 0.57–1.00)
Globulin, Total: 2.9 g/dL (ref 1.5–4.5)
Glucose: 101 mg/dL — ABNORMAL HIGH (ref 70–99)
Potassium: 4.7 mmol/L (ref 3.5–5.2)
Sodium: 144 mmol/L (ref 134–144)
Total Protein: 7.5 g/dL (ref 6.0–8.5)
eGFR: 66 mL/min/{1.73_m2} (ref >59)

## 2021-06-27 LAB — HEMOGLOBIN A1C
Est. average glucose Bld gHb Est-mCnc: 160 mg/dL
Hgb A1c MFr Bld: 7.2 % — ABNORMAL HIGH (ref 4.8–5.6)

## 2021-06-28 ENCOUNTER — Ambulatory Visit (INDEPENDENT_AMBULATORY_CARE_PROVIDER_SITE_OTHER): Payer: Medicare Other | Admitting: Family Medicine

## 2021-06-28 ENCOUNTER — Encounter: Payer: Self-pay | Admitting: Family Medicine

## 2021-06-28 VITALS — BP 159/84 | HR 75 | Ht 60.0 in | Wt 178.1 lb

## 2021-06-28 DIAGNOSIS — R079 Chest pain, unspecified: Secondary | ICD-10-CM

## 2021-06-28 DIAGNOSIS — I10 Essential (primary) hypertension: Secondary | ICD-10-CM

## 2021-06-28 DIAGNOSIS — E785 Hyperlipidemia, unspecified: Secondary | ICD-10-CM

## 2021-06-28 DIAGNOSIS — R9431 Abnormal electrocardiogram [ECG] [EKG]: Secondary | ICD-10-CM

## 2021-06-28 DIAGNOSIS — Z1231 Encounter for screening mammogram for malignant neoplasm of breast: Secondary | ICD-10-CM | POA: Diagnosis not present

## 2021-06-28 MED ORDER — OLMESARTAN MEDOXOMIL 40 MG PO TABS: 40.0000 mg | ORAL_TABLET | Freq: Every day | ORAL | 2 refills | Status: DC

## 2021-06-28 NOTE — Assessment & Plan Note (Signed)
Uncontrolled, inc benicar to 40 mg daily ?DASH diet and commitment to daily physical activity for a minimum of 30 minutes discussed and encouraged, as a part of hypertension management. ?The importance of attaining a healthy weight is also discussed. ? ? ?  06/28/2021  ? 10:41 AM 05/04/2021  ? 10:01 AM 03/24/2021  ?  2:08 PM 03/24/2021  ?  1:27 PM 03/07/2021  ? 11:30 AM 03/07/2021  ? 11:25 AM 03/07/2021  ? 10:49 AM  ?BP/Weight  ?Systolic BP 735 670 141 030 130 122 168  ?Diastolic BP 84 82 70 81 52 49 69  ?Wt. (Lbs) 178.08 180  180.08   183  ?BMI 34.78 kg/m2 35.15 kg/m2  35.17 kg/m2   34.58 kg/m2  ? ? ? ? ?

## 2021-06-28 NOTE — Patient Instructions (Addendum)
F/U in 8 weeks, call if you need me sooner ? ? ?Congrats on marked improvement in blood sugar, keep it up!! ? ? ?BP is still too high, increase olmesartan to 40 mkg daily ?CARDIOLOGY DUE TO PERSONAL RISK AND POSITIVE F/H cad IN [place] IN HER 50'S ? ? ?pLEASE SCHEDULE SCREENING MAMMOGRAM DUE IN 09/2021 AT CHECKOUT ? ?Thanks for choosing [facility], we consider it a privelige to serve you. ? ? ? ?

## 2021-06-28 NOTE — Assessment & Plan Note (Addendum)
Hyperlipidemia:Low fat diet discussed and encouraged. ? ? ?Lipid Panel  ?Lab Results  ?Component Value Date  ? CHOL 166 04/07/2021  ? HDL 63 04/07/2021  ? [place] 91 04/07/2021  ? TRIG 63 04/07/2021  ? CHOLHDL 2.6 04/07/2021  ? ? ? ?Controlled, no change in medication ? ?

## 2021-06-28 NOTE — Assessment & Plan Note (Signed)
Single episode with no aggravating or relieving factors, inc cV risk and abn EKG refer cardiology ?

## 2021-06-28 NOTE — Assessment & Plan Note (Signed)
Improved ? ?Patient re-educated about  the importance of commitment to a  minimum of 150 minutes of exercise per week as able. ? ?The importance of healthy food choices with portion control discussed, as well as eating regularly and within a 12 hour window most days. ?The need to choose "clean , green" food 50 to 75% of the time is discussed, as well as to make water the primary drink and set a goal of 64 ounces water daily. ? ?  ? ?  06/28/2021  ? 10:41 AM 05/04/2021  ? 10:01 AM 03/24/2021  ?  1:27 PM  ?Weight /BMI  ?Weight 178 lb 1.3 oz 180 lb 180 lb 1.3 oz  ?Height 5' (1.524 m) 5' (1.524 m) 5' (1.524 m)  ?BMI 34.78 kg/m2 35.15 kg/m2 35.17 kg/m2  ? ? ? ?

## 2021-06-28 NOTE — Progress Notes (Signed)
? ?Michaela Peterson     MRN: [id number]      DOB: 07/21/1955 ? ? ?HPI ?Michaela Peterson is here for follow up and re-evaluation of chronic medical conditions, medication management and review of any available recent lab and radiology data.  ?Preventive health is updated, specifically  Cancer screening and Immunization.   ?Questions or concerns regarding consultations or procedures which the PT has had in the interim are  addressed. ?Single episode of severe chest pain which left her weak rthe entire day several weeks ago, non radiaiting, no inciting or relieving factor ?Has increasd CAD risk based on personal and family history ?Has ahd low sugar episodes feels light headed / weak when in the 90's , still not weating on a regular schedule ?ROS ?Denies recent fever or chills. ?Denies sinus pressure, nasal congestion, ear pain or sore throat. ?Denies chest congestion, productive cough or wheezing. ?Denies abdominal pain, nausea, vomiting,diarrhea or constipation.   ?Denies dysuria, frequency, hesitancy or incontinence. ?Denies joint pain, swelling and limitation in mobility. ?Denies headaches, seizures, numbness, or tingling. ?Denies depression, anxiety or insomnia. ?Denies skin break down or rash. ? ? ?PE ? ?BP (!) 159/84   Pulse 75   Ht 5' (1.524 m)   Wt 178 lb 1.3 oz (80.8 kg)   LMP 08/18/2012   SpO2 97%   BMI 34.78 kg/m?  ? ?Patient alert and oriented and in no cardiopulmonary distress. ? ?HEENT: No facial asymmetry, EOMI,     Neck supple . ? ?Chest: Clear to auscultation bilaterally. ? ?CVS: S1, S2 no murmurs, no S3.Regular rate. ?EKG: LVH and possible old infarct, sinus rythm ?ABD: Soft non tender.  ? ?Ext: No edema ? ?MS: Adequate ROM spine, shoulders, hips and knees. ? ?Skin: Intact, no ulcerations or rash noted. ? ?Psych: Good eye contact, normal affect. Memory intact not anxious or depressed appearing. ? ?CNS: CN 2-12 intact, power,  normal throughout.no focal deficits noted. ? ? ?Assessment & Plan ? ?Chest  pain ?Single episode with no aggravating or relieving factors, inc cV risk and abn EKG refer cardiology ? ?Hyperlipidemia LDL goal <100 ?Hyperlipidemia:Low fat diet discussed and encouraged. ? ? ?Lipid Panel  ?Lab Results  ?Component Value Date  ? CHOL 166 04/07/2021  ? HDL 63 04/07/2021  ? [place] 91 04/07/2021  ? TRIG 63 04/07/2021  ? CHOLHDL 2.6 04/07/2021  ? ? ? ?Controlled, no change in medication ? ? ?Hypertension goal BP (blood pressure) < 140/90 ?Uncontrolled, inc benicar to 40 mg daily ?DASH diet and commitment to daily physical activity for a minimum of 30 minutes discussed and encouraged, as a part of hypertension management. ?The importance of attaining a healthy weight is also discussed. ? ? ?  06/28/2021  ? 10:41 AM 05/04/2021  ? 10:01 AM 03/24/2021  ?  2:08 PM 03/24/2021  ?  1:27 PM 03/07/2021  ? 11:30 AM 03/07/2021  ? 11:25 AM 03/07/2021  ? 10:49 AM  ?BP/Weight  ?Systolic BP 177 939 030 092 130 122 168  ?Diastolic BP 84 82 70 81 52 49 69  ?Wt. (Lbs) 178.08 180  180.08   183  ?BMI 34.78 kg/m2 35.15 kg/m2  35.17 kg/m2   34.58 kg/m2  ? ? ? ? ? ?Morbid obesity ([place]) ?Improved ? ?Patient re-educated about  the importance of commitment to a  minimum of 150 minutes of exercise per week as able. ? ?The importance of healthy food choices with portion control discussed, as well as eating regularly and within a 12  hour window most days. ?The need to choose "clean , green" food 50 to 75% of the time is discussed, as well as to make water the primary drink and set a goal of 64 ounces water daily. ? ?  ? ?  06/28/2021  ? 10:41 AM 05/04/2021  ? 10:01 AM 03/24/2021  ?  1:27 PM  ?Weight /BMI  ?Weight 178 lb 1.3 oz 180 lb 180 lb 1.3 oz  ?Height 5' (1.524 m) 5' (1.524 m) 5' (1.524 m)  ?BMI 34.78 kg/m2 35.15 kg/m2 35.17 kg/m2  ? ? ? ? ?

## 2021-07-05 ENCOUNTER — Ambulatory Visit: Payer: Medicare Other | Admitting: Gastroenterology

## 2021-07-05 ENCOUNTER — Encounter: Payer: Self-pay | Admitting: Gastroenterology

## 2021-07-05 VITALS — BP 150/70 | HR 71 | Temp 97.1°F | Ht 60.0 in | Wt 178.8 lb

## 2021-07-05 DIAGNOSIS — K3184 Gastroparesis: Secondary | ICD-10-CM

## 2021-07-05 DIAGNOSIS — K219 Gastro-esophageal reflux disease without esophagitis: Secondary | ICD-10-CM

## 2021-07-05 NOTE — Progress Notes (Signed)
? ? ? ?GI Office Note   ? ?Referring Provider: [name], MD ?Primary Care Physician:  [name], MD  ?Primary Gastroenterologist: Garfield Cornea, MD ? ? ?Chief Complaint  ? ?Chief Complaint  ?Patient presents with  ? Follow-up  ?  States that she was told by Dr. Moshe Cipro that she has a hernia and would like to know where it is located. No other issues to discuss.   ? ? ?History of Present Illness  ? ?Michaela Peterson is a 66 y.o. female presenting today for follow-up.  Seen in the office back in December 2022.  She has a history of constipation, gastroparesis, dysphagia.  History of IDA in 2015 status post colonoscopy/EGD normal aside from gastric polyps which were fundic gland, small bowel capsule unremarkable in 2016.  Most recent colonoscopy September 2020 with 2 tubular adenomas removed, surveillance due in 2025.  Completed EGD after last visit as outlined below. Small supraumbilical fat containing ventral hernia noted on 12/2020 CT.   ? ?Dysphagia is better. Feels pressure in the epigastric region especially after meals, feels like needs to burp for relief but hard to burp. Can get relief with carbonation to help burp. Drinks 3-4 glasses of diet pepsi every day. Does not drink a lot of water. Vomiting still about once per month. Has happened after medications or after meals. No heartburn. BM doing ok. Does not have to take anything right now for constipation. BM at least few times per week. No melena, brbpr. TUMS helps at time.  ? ?EGD 02/2021: ?- Gastritis. Biopsied. Negative for H.pylori.  ?- Normal duodenal bulb, first portion of the duodenum and second portion of the ?duodenum. ? ? ?Medications  ? ?Current Outpatient Medications  ?Medication Sig Dispense Refill  ? aspirin EC 81 MG tablet Take 81 mg by mouth daily.    ? azelastine (ASTELIN) 0.1 % nasal spray Place 2 sprays into both nostrils 2 (two) times daily. Use in each nostril as directed 30 mL 12  ? Blood Glucose Monitoring Suppl (ONE  TOUCH ULTRA 2) w/Device KIT USE TO TEST ONCE DAILY 1 kit 0  ? Cholecalciferol (VITAMIN D3) 50 MCG (2000 UT) TABS Take 2,000 Units by mouth daily.    ? fluorometholone (FML) 0.1 % ophthalmic suspension Place 1 drop into both eyes 3 (three) times daily.    ? fluticasone (FLONASE) 50 MCG/ACT nasal spray Place 2 sprays into both nostrils daily. 9.9 mL 1  ? glipiZIDE (GLUCOTROL) 10 MG tablet TAKE 1 TABLET BY MOUTH  TWICE DAILY BEFORE MEALS 180 tablet 3  ? glucose blood (ONETOUCH ULTRA) test strip USE TO TEST ONCE DAILY 100 strip 3  ? ibuprofen (ADVIL) 200 MG tablet Take 800 mg by mouth every 6 (six) hours as needed (Knee and shoulder pain).    ? Lancets (ONETOUCH DELICA PLUS DHWYSH68H) MISC Test once daily DX e11.9 100 each 3  ? metFORMIN (GLUCOPHAGE) 1000 MG tablet TAKE 1 TABLET BY MOUTH  TWICE DAILY WITH MEALS 180 tablet 3  ? montelukast (SINGULAIR) 10 MG tablet TAKE 1 TABLET BY MOUTH AT  BEDTIME 90 tablet 3  ? NIFEdipine (ADALAT CC) 90 MG 24 hr tablet TAKE 1 TABLET BY MOUTH  DAILY 90 tablet 3  ? olmesartan (BENICAR) 40 MG tablet Take 1 tablet (40 mg total) by mouth daily. 30 tablet 2  ? ondansetron (ZOFRAN) 4 MG tablet Take 1 tablet (4 mg total) by mouth every 8 (eight) hours as needed for nausea or vomiting. 12  tablet 0  ? pantoprazole (PROTONIX) 40 MG tablet Take 1 tablet (40 mg total) by mouth daily before breakfast. 90 tablet 3  ? Polyethyl Glycol-Propyl Glycol (SYSTANE) 0.4-0.3 % SOLN Place 1 drop into both eyes 2 (two) times daily as needed (Dry eye).    ? rosuvastatin (CRESTOR) 5 MG tablet TAKE ONE TABLET BY MOUTH EVERY MONDAY AND FRIDAY 26 tablet 4  ? spironolactone (ALDACTONE) 50 MG tablet TAKE 1 TABLET BY MOUTH DAILY 90 tablet 3  ? triamcinolone cream (KENALOG) 0.1 % Apply 1 application topically daily as needed (rash). 30 g 0  ? ?No current facility-administered medications for this visit.  ? ? ?Allergies  ? ?Allergies as of 07/05/2021 - Review Complete 07/05/2021  ?Allergen Reaction Noted  ? Gabapentin  Shortness Of Breath 02/13/2017  ? Ace inhibitors Cough 03/26/2012  ? Aspirin    ? Oxycodone Hives 04/01/2018  ? Penicillins Hives   ? Percocet [oxycodone-acetaminophen] Hives 03/03/2021  ? Rebif [interferon beta-1a] Other (See Comments) 05/01/2018  ? Statins Other (See Comments) 10/11/2012  ? ?   ? ?Review of Systems  ? ?General: Negative for anorexia, weight loss, fever, chills, fatigue, weakness. ?ENT: Negative for hoarseness, difficulty swallowing , nasal congestion. ?CV: Negative for chest pain, angina, palpitations, dyspnea on exertion, peripheral edema.  ?Respiratory: Negative for dyspnea at rest, dyspnea on exertion, cough, sputum, wheezing.  ?GI: See history of present illness. ?GU:  Negative for dysuria, hematuria, urinary incontinence, urinary frequency, nocturnal urination.  ?Endo: Negative for unusual weight change.  ?   ?Physical Exam  ? ?BP (!) 150/70 (BP Location: Right Arm, Patient Position: Sitting, Cuff Size: Normal)   Pulse 71   Temp (!) 97.1 ?F (36.2 ?C) (Temporal)   Ht 5' (1.524 m)   Wt 178 lb 12.8 oz (81.1 kg)   LMP 08/18/2012   SpO2 97%   BMI 34.92 kg/m?  ?  ?General: Well-nourished, well-developed in no acute distress.  ?Eyes: No icterus. ?Mouth: Oropharyngeal mucosa moist and pink , no lesions erythema or exudate. ?Lungs: Clear to auscultation bilaterally.  ?Heart: Regular rate and rhythm, no murmurs rubs or gallops.  ?Abdomen: Bowel sounds are normal, nontender, nondistended, no hepatosplenomegaly or masses,  ?no abdominal bruits or hernia , no rebound or guarding.  ?Rectal: not performed ?Extremities: No lower extremity edema. No clubbing or deformities. ?Neuro: Alert and oriented x 4   ?Skin: Warm and dry, no jaundice.   ?Psych: Alert and cooperative, normal mood and affect. ? ?Labs  ? ?Lab Results  ?Component Value Date  ? HGBA1C 7.2 (H) 06/26/2021  ? ?Lab Results  ?Component Value Date  ? CREATININE 0.96 06/26/2021  ? BUN 23 06/26/2021  ? NA 144 06/26/2021  ? K 4.7 06/26/2021  ?  CL 109 (H) 06/26/2021  ? CO2 20 06/26/2021  ? ?Lab Results  ?Component Value Date  ? WBC 9.2 02/10/2021  ? HGB 12.4 02/10/2021  ? HCT 37.1 02/10/2021  ? MCV 85 02/10/2021  ? PLT 406 02/10/2021  ? ?Lab Results  ?Component Value Date  ? ALT 16 06/26/2021  ? AST 13 06/26/2021  ? ALKPHOS 97 06/26/2021  ? BILITOT 0.3 06/26/2021  ? ? ?Imaging Studies  ? ?No results found. ? ?Assessment  ? ?Gastroparesis: overall doing ok. Occasional vomiting. Reinforced need to consume 5-6 small meals daily, avoid fatty/greasy foods.  ? ?GERD/dysphagia: doing better. Having some indigestion. Drinking considerable amount of carbonated beverages daily. Encouraged her to cut back. Continue pantoprazole once daily.  ? ?IDA: Labs  were better back in 02/2021. Consider updating CBC/iron/tibc/ferritin in 3 months. Not done with recent labs. ? ? ? ? ?PLAN  ? ?CBC, iron/tibc/ferritin in 10/2021.  ?Continue pantoprazole 1m daily. ?Reinforced antireflux and gastroparesis instructions. ?Return to the office in one year.  ? ? ?LLaureen Ochs [place]on Mergenthaler, MHS, PA-C ?[facility] ? ?

## 2021-07-05 NOTE — Patient Instructions (Addendum)
Continue pantoprazole '40mg'$  once daily before a meal.  ?Please refer to information provided hear about gastroparesis (slow emptying stomach). Make appropriate modifications in your diet to see if this helps your bloating/pressure in the upper abdomen as well as intermittent vomiting.  ?It is very important to eat low fat/low greasy foods. These are easier to digest. If you eat vegetables, eat them cooked as raw vegetables are harder to digest.  ? ?Gastroparesis ? ?Gastroparesis is a condition in which food takes longer than normal to empty from the stomach. This condition is also known as delayed gastric emptying. It is usually a long-term (chronic) condition. ?There is no cure, but there are treatments and things that you can do at home to help relieve symptoms. Treating the underlying condition that causes gastroparesis can also help relieve symptoms. ?What are the causes? ?In many cases, the cause of this condition is not known. Possible causes include: ?A hormone (endocrine) disorder, such as hypothyroidism or diabetes. ?A nervous system disease, such as Parkinson's disease or multiple sclerosis. ?Cancer, infection, or surgery that affects the stomach or vagus nerve. The vagus nerve runs from your chest, through your neck, and to the lower part of your brain. ?A connective tissue disorder, such as scleroderma. ?Certain medicines. ?What increases the risk? ?You are more likely to develop this condition if: ?You have certain disorders or diseases. These may include: ?An endocrine disorder. ?An eating disorder. ?Amyloidosis. ?Scleroderma. ?Parkinson's disease. ?Multiple sclerosis. ?Cancer or infection of the stomach or the vagus nerve. ?You have had surgery on your stomach or vagus nerve. ?You take certain medicines. ?You are female. ?What are the signs or symptoms? ?Symptoms of this condition include: ?Feeling full after eating very little or a loss of appetite. ?Nausea, vomiting, or heartburn. ?Bloating of your  abdomen. ?Inconsistent blood sugar (glucose) levels on blood tests. ?Unexplained weight loss. ?Acid from the stomach coming up into the esophagus (gastroesophageal reflux). ?Sudden tightening (spasm) of the stomach, which can be painful. ?Symptoms may come and go. Some people may not notice any symptoms. ?How is this diagnosed? ?This condition is diagnosed with tests, such as: ?Tests that check how long it takes food to move through the stomach and intestines. These tests include: ?Upper gastrointestinal (GI) series. For this test, you drink a liquid that shows up well on X-rays, and then X-rays are taken of your intestines. ?Gastric emptying scintigraphy. For this test, you eat food that contains a small amount of radioactive material, and then scans are taken. ?Wireless capsule GI monitoring system. For this test, you swallow a pill (capsule) that records information about how foods and fluid move through your stomach. ?Gastric manometry. For this test, a tube is passed down your throat and into your stomach to measure electrical and muscular activity. ?Endoscopy. For this test, a long, thin tube with a camera and light on the end is passed down your throat and into your stomach to check for problems in your stomach lining. ?Ultrasound. This test uses sound waves to create images of the inside of your body. This can help rule out gallbladder disease or pancreatitis as a cause of your symptoms. ?How is this treated? ?There is no cure for this condition, but treatment and home care may relieve symptoms. Treatment may include: ?Treating the underlying cause. ?Managing your symptoms by making changes to your diet and exercise habits. ?Taking medicines to control nausea and vomiting and to stimulate stomach muscles. ?Getting food through a feeding tube in the hospital. This may  be done in severe cases. ?Having surgery to insert a device called a gastric electrical stimulator into your body. This device helps improve  stomach emptying and control nausea and vomiting. ?Follow these instructions at home: ?Take over-the-counter and prescription medicines only as told by your health care provider. ?Follow instructions from your health care provider about eating or drinking restrictions. Your health care provider may recommend that you: ?Eat smaller meals more often. ?Eat low-fat foods. ?Eat low-fiber forms of high-fiber foods. For example, eat cooked vegetables instead of raw vegetables. ?Have only liquid foods instead of solid foods. Liquid foods are easier to digest. ?Drink enough fluid to keep your urine pale yellow. ?Exercise as often as told by your health care provider. ?Keep all follow-up visits. This is important. ?Contact a health care provider if you: ?Notice that your symptoms do not improve with treatment. ?Have new symptoms. ?Get help right away if you: ?Have severe pain in your abdomen that does not improve with treatment. ?Have nausea that is severe or does not go away. ?Vomit every time you drink fluids. ?Summary ?Gastroparesis is a long-term (chronic) condition in which food takes longer than normal to empty from the stomach. ?Symptoms include nausea, vomiting, heartburn, bloating of your abdomen, and loss of appetite. ?Eating smaller portions, low-fat foods, and low-fiber forms of high-fiber foods may help you manage your symptoms. ?Get help right away if you have severe pain in your abdomen. ?This information is not intended to replace advice given to you by your health care provider. Make sure you discuss any questions you have with your health care provider. ?Document Revised: 07/06/2019 Document Reviewed: 07/06/2019 ?Elsevier Patient Education ? [place]. ? ?

## 2021-07-10 ENCOUNTER — Other Ambulatory Visit: Payer: Self-pay | Admitting: Family Medicine

## 2021-07-10 ENCOUNTER — Other Ambulatory Visit: Payer: Self-pay | Admitting: Gastroenterology

## 2021-07-10 NOTE — Telephone Encounter (Signed)
Last ov 07/05/21 ?

## 2021-07-13 ENCOUNTER — Ambulatory Visit (INDEPENDENT_AMBULATORY_CARE_PROVIDER_SITE_OTHER): Payer: Medicare Other | Admitting: Family Medicine

## 2021-07-13 ENCOUNTER — Encounter: Payer: Self-pay | Admitting: Family Medicine

## 2021-07-13 VITALS — BP 137/61 | HR 67 | Ht 60.0 in | Wt 179.1 lb

## 2021-07-13 DIAGNOSIS — I10 Essential (primary) hypertension: Secondary | ICD-10-CM

## 2021-07-13 DIAGNOSIS — M542 Cervicalgia: Secondary | ICD-10-CM | POA: Insufficient documentation

## 2021-07-13 DIAGNOSIS — G35 Multiple sclerosis: Secondary | ICD-10-CM

## 2021-07-13 LAB — HM HEPATITIS C SCREENING LAB: HM Hepatitis Screen: NEGATIVE

## 2021-07-13 MED ORDER — PREDNISONE 20 MG PO TABS: 20.0000 mg | ORAL_TABLET | Freq: Two times a day (BID) | ORAL | 0 refills | Status: AC

## 2021-07-13 NOTE — Assessment & Plan Note (Signed)
Controlled, no change in medication ?DASH diet and commitment to daily physical activity for a minimum of 30 minutes discussed and encouraged, as a part of hypertension management. ?The importance of attaining a healthy weight is also discussed. ? ? ?  07/13/2021  ? 10:16 AM 07/05/2021  ? 11:01 AM 06/28/2021  ? 10:41 AM 05/04/2021  ? 10:01 AM 03/24/2021  ?  2:08 PM 03/24/2021  ?  1:27 PM 03/07/2021  ? 11:30 AM  ?BP/Weight  ?Systolic BP 505 697 948 016 150 137 130  ?Diastolic BP 61 70 84 82 70 81 52  ?Wt. (Lbs) 179.12 178.8 178.08 180  180.08   ?BMI 34.98 kg/m2 34.92 kg/m2 34.78 kg/m2 35.15 kg/m2  35.17 kg/m2   ? ? ? ? ?

## 2021-07-13 NOTE — Assessment & Plan Note (Addendum)
2 week history, right worse than left, shorrt course of prednisone., has known arthritis and disc disease, will need updated MRI and painmanagement if not improved ?

## 2021-07-13 NOTE — Patient Instructions (Addendum)
F/U as before. Call if you need me sooner ? ?5 day course of prednisone is prescribed for pain, in arm which I believe is due to arthritis and disc disease in your neck ? ?You need to return to Neurology about your MS, I have referred  you ? ?For itch and rash , please try to keep area dry, pure vaseline is a good protectant, and limit how long or how often you wear a pad, as much as possible ? ?It is important that you exercise regularly at least 30 minutes 5 times a week. If you develop chest pain, have severe difficulty breathing, or feel very tired, stop exercising immediately and seek medical attention  ? ?Thanks for choosing [facility], we consider it a privelige to serve you. ? ?

## 2021-07-13 NOTE — Assessment & Plan Note (Signed)
New neurologic complaints of numbness in right lower leg, also reports 2 week h/o R uE pain and numbness, needs neurology f/u for MS , referral entered ?

## 2021-07-13 NOTE — Progress Notes (Signed)
? ?Michaela Peterson     MRN: [id number]      DOB: Jun 10, 1955 ? ? ?HPI ?Michaela Peterson is here for follow up and re-evaluation of chronic medical conditions, medication management and review of any available recent lab and radiology data.  ?Preventive health is updated, specifically  Cancer screening and Immunization.   ?Questions or concerns regarding consultations or procedures which the PT has had in the interim are  addressed. ?2 week h/o right neck and arm pain, also reports abnormal sensation in right leg  ?Itchy rash in inguinal area, wears pad for incontinence, wants to know what to do  to reduce rash/ itch ?Denies polyuria, polydipsia, blurred vision , or hypoglycemic episodes. ? ? ?ROS ?Denies recent fever or chills. ?Denies sinus pressure, nasal congestion, ear pain or sore throat. ?Denies chest congestion, productive cough or wheezing. ?Denies chest pains, palpitations and leg swelling ?Denies abdominal pain, nausea, vomiting,diarrhea or constipation.   ?Denies dysuria, frequency, hesitancy or incontinence. ?Denies headaches, seizures, numbness, or tingling. ?Denies depression, anxiety or insomnia. ? ? ?PE ? ?BP 137/61   Pulse 67   Ht 5' (1.524 m)   Wt 179 lb 1.9 oz (81.2 kg)   LMP 08/18/2012   SpO2 96%   BMI 34.98 kg/m?  ? ?Patient alert and oriented and in no cardiopulmonary distress. ? ?HEENT: No facial asymmetry, EOMI,     Neck supple . ? ?Chest: Clear to auscultation bilaterally. ? ?CVS: S1, S2 no murmurs, no S3.Regular rate. ? ?ABD: Soft non tender.  ? ?Ext: No edema ? ?MS: Adequate ROM spine, shoulders, hips and knees. ? ?Skin: Intact, no ulcerations or rash noted. ? ?Psych: Good eye contact, normal affect. Memory intact not anxious or depressed appearing. ? ?CNS: CN 2-12 intact, power,  normal throughout.no focal deficits noted. ? ? ?Assessment & Plan ? ?MULTIPLE SCLEROSIS ?New neurologic complaints of numbness in right lower leg, also reports 2 week h/o R uE pain and numbness, needs neurology f/u  for MS , referral entered ? ?Neck pain ?2 week history, right worse than left, shorrt course of prednisone., has known arthritis and disc disease, will need updated MRI and painmanagement if not improved ? ?Hypertension goal BP (blood pressure) < 140/90 ?Controlled, no change in medication ?DASH diet and commitment to daily physical activity for a minimum of 30 minutes discussed and encouraged, as a part of hypertension management. ?The importance of attaining a healthy weight is also discussed. ? ? ?  07/13/2021  ? 10:16 AM 07/05/2021  ? 11:01 AM 06/28/2021  ? 10:41 AM 05/04/2021  ? 10:01 AM 03/24/2021  ?  2:08 PM 03/24/2021  ?  1:27 PM 03/07/2021  ? 11:30 AM  ?BP/Weight  ?Systolic BP 254 270 623 762 150 137 130  ?Diastolic BP 61 70 84 82 70 81 52  ?Wt. (Lbs) 179.12 178.8 178.08 180  180.08   ?BMI 34.98 kg/m2 34.92 kg/m2 34.78 kg/m2 35.15 kg/m2  35.17 kg/m2   ? ? ? ? ? ?Morbid obesity ([place]) ? ?Patient re-educated about  the importance of commitment to a  minimum of 150 minutes of exercise per week as able. ? ?The importance of healthy food choices with portion control discussed, as well as eating regularly and within a 12 hour window most days. ?The need to choose "clean , green" food 50 to 75% of the time is discussed, as well as to make water the primary drink and set a goal of 64 ounces water daily. ? ?  ? ?  07/13/2021  ? 10:16 AM 07/05/2021  ? 11:01 AM 06/28/2021  ? 10:41 AM  ?Weight /BMI  ?Weight 179 lb 1.9 oz 178 lb 12.8 oz 178 lb 1.3 oz  ?Height 5' (1.524 m) 5' (1.524 m) 5' (1.524 m)  ?BMI 34.98 kg/m2 34.92 kg/m2 34.78 kg/m2  ? ? ? ? ?

## 2021-07-13 NOTE — Assessment & Plan Note (Signed)
?  Patient re-educated about  the importance of commitment to a  minimum of 150 minutes of exercise per week as able. ? ?The importance of healthy food choices with portion control discussed, as well as eating regularly and within a 12 hour window most days. ?The need to choose "clean , green" food 50 to 75% of the time is discussed, as well as to make water the primary drink and set a goal of 64 ounces water daily. ? ?  ? ?  07/13/2021  ? 10:16 AM 07/05/2021  ? 11:01 AM 06/28/2021  ? 10:41 AM  ?Weight /BMI  ?Weight 179 lb 1.9 oz 178 lb 12.8 oz 178 lb 1.3 oz  ?Height 5' (1.524 m) 5' (1.524 m) 5' (1.524 m)  ?BMI 34.98 kg/m2 34.92 kg/m2 34.78 kg/m2  ? ? ? ?

## 2021-07-15 ENCOUNTER — Telehealth: Payer: Self-pay | Admitting: Gastroenterology

## 2021-07-15 NOTE — Telephone Encounter (Signed)
Please nic for ov in one year. ?

## 2021-07-15 NOTE — Telephone Encounter (Signed)
Please arrange for CBC, iron/tibc/ferritin in 10/2021 for dx: IDA. ?

## 2021-07-17 ENCOUNTER — Other Ambulatory Visit: Payer: Self-pay

## 2021-07-17 ENCOUNTER — Other Ambulatory Visit: Payer: Self-pay | Admitting: Family Medicine

## 2021-07-17 DIAGNOSIS — D509 Iron deficiency anemia, unspecified: Secondary | ICD-10-CM

## 2021-07-17 NOTE — Telephone Encounter (Signed)
Labs have been ordered and will be mailed to the pt closer to time to have drawn. Routing to the front to nic f/u ov.  ?

## 2021-07-20 DIAGNOSIS — M79671 Pain in right foot: Secondary | ICD-10-CM | POA: Diagnosis not present

## 2021-07-20 DIAGNOSIS — M79675 Pain in left toe(s): Secondary | ICD-10-CM | POA: Diagnosis not present

## 2021-07-20 DIAGNOSIS — M79674 Pain in right toe(s): Secondary | ICD-10-CM | POA: Diagnosis not present

## 2021-07-20 DIAGNOSIS — I739 Peripheral vascular disease, unspecified: Secondary | ICD-10-CM | POA: Diagnosis not present

## 2021-07-20 DIAGNOSIS — E1140 Type 2 diabetes mellitus with diabetic neuropathy, unspecified: Secondary | ICD-10-CM | POA: Diagnosis not present

## 2021-07-20 DIAGNOSIS — M79672 Pain in left foot: Secondary | ICD-10-CM | POA: Diagnosis not present

## 2021-07-20 DIAGNOSIS — L110 Acquired keratosis follicularis: Secondary | ICD-10-CM | POA: Diagnosis not present

## 2021-07-31 ENCOUNTER — Other Ambulatory Visit: Payer: Self-pay | Admitting: Family Medicine

## 2021-07-31 DIAGNOSIS — J309 Allergic rhinitis, unspecified: Secondary | ICD-10-CM

## 2021-08-02 ENCOUNTER — Encounter: Payer: Self-pay | Admitting: Pulmonary Disease

## 2021-08-02 ENCOUNTER — Ambulatory Visit: Payer: Medicare Other | Admitting: Pulmonary Disease

## 2021-08-02 VITALS — BP 132/84 | HR 60 | Temp 98.4°F | Ht 60.0 in | Wt 180.4 lb

## 2021-08-02 DIAGNOSIS — G4733 Obstructive sleep apnea (adult) (pediatric): Secondary | ICD-10-CM | POA: Diagnosis not present

## 2021-08-02 NOTE — Patient Instructions (Signed)
  Try to get back on your CPAP with your mask  X Call [phone] for mask desensitization   We can schedule CPAP titration study  Think about Inspire implant

## 2021-08-02 NOTE — Assessment & Plan Note (Signed)
She has severe OSA and we discussed cardiovascular implications of this.  Unfortunately she has had difficulty tolerating CPAP mask interface.  We discussed options including -Retrial with a different mask, AirFit F30 has been provided -Mass desensitization visit -Repeat CPAP titration study to see if we can get her comfortable and find a better fitting mask for her.  -If above does not work, I discussed inspire therapy in detail.  She is concerned about having a foreign body but asked her to consider this if she is unable to tolerate CPAP  Weight loss encouraged, compliance with goal of at least 4-6 hrs every night is the expectation. Advised against medications with sedative side effects Cautioned against driving when sleepy - understanding that sleepiness will vary on a day to day basis

## 2021-08-02 NOTE — Progress Notes (Signed)
   Subjective:    Patient ID: Michaela Peterson, female    DOB: 03-09-1956, 66 y.o.   MRN: [id number]  HPI 66 year old presents for follow-up of OSA. She used to be followed by Dr. Annamaria Boots in the [facility] office  PMH - GERD, DM 2/gastroparesis, Multiple Sclerosis  OSA was diagnosed in 2018 when her weight was around 184 pounds.  She needed long courses of prednisone and gained significant weight up to 210 pounds. She has now lost all the weight back down to 180 pounds.  She could not tolerate nasal mask in spite of trying a chinstrap she was provided with a fullface mask. I reviewed sleep studies. Reviewed last office visit 02/2021, she obtained her CPAP initially from [facility] and then transferred DME to [facility] for supplies   Significant tests/ events reviewed  09/2016 CPAP titration >> 8 cm , med NPs HST 12/12/20- AHI 46.9/ hr, desaturation to 76%, body weight 184 lbs  Home Sleep Test- 08/27/16-AHI 25.4/hour, desaturation to 80%, body weight 184.6 pounds   Review of Systems neg for any significant sore throat, dysphagia, itching, sneezing, nasal congestion or excess/ purulent secretions, fever, chills, sweats, unintended wt loss, pleuritic or exertional cp, hempoptysis, orthopnea pnd or change in chronic leg swelling. Also denies presyncope, palpitations, heartburn, abdominal pain, nausea, vomiting, diarrhea or change in bowel or urinary habits, dysuria,hematuria, rash, arthralgias, visual complaints, headache, numbness weakness or ataxia.     Objective:   Physical Exam  Gen. Pleasant, obese, in no distress ENT - no lesions, no post nasal drip, dentures upper jaw Neck: No JVD, no thyromegaly, no carotid bruits Lungs: no use of accessory muscles, no dullness to percussion, decreased without rales or rhonchi  Cardiovascular: Rhythm regular, heart sounds  normal, no murmurs or gallops, no peripheral edema Musculoskeletal: No deformities, no cyanosis or clubbing , no  tremors       Assessment & Plan:

## 2021-08-07 ENCOUNTER — Encounter: Payer: Self-pay | Admitting: Cardiology

## 2021-08-07 NOTE — Progress Notes (Unsigned)
  Cardiology Office Note  Date: 08/08/2021   ID: Michaela Peterson, DOB 03/09/1956, MRN [id number]  PCP:  Simpson, Margaret E, MD  Cardiologist:  Samuel McDowell, MD Electrophysiologist:  None   Chief Complaint  Patient presents with   Chest discomfort    History of Present Illness: Michaela Peterson is a 65 y.o. female referred for cardiology consultation by Dr. Simpson for evaluation of chest pain.  She states that a few months ago she felt a left sided chest tightness that occurred when her blood sugar was low 1 evening.  This lasted for several minutes, she went to lay down and symptoms ultimately resolved.  She does have a feeling of exertional fatigue at baseline, also recurrent reflux and sensation of bloating and is nonexertional.  She has known gastroparesis and GERD in the setting of type 2 diabetes mellitus.  I reviewed her medications which are noted below.  She does have a history of statin myalgias, but is currently tolerating low-dose Crestor.  Remainder of her medications also reviewed.  She had lab work done in April.  She has not undergone any cardiac ischemic testing within the last 10 years at least.  She follows with Dr. Alva for treatment of severe OSA on CPAP, I reviewed the recent office note.   Past Medical History:  Diagnosis Date   Adnexal mass    Left; followed by Dr. Ferguson   Anemia, iron deficiency    Arthritis    Chronic low back pain    Diabetes mellitus, type 2 (HCC)    Essential hypertension    Gastroparesis    GERD (gastroesophageal reflux disease)    MS (multiple sclerosis) (HCC)    Nonalcoholic fatty liver disease    Obesity    Sleep apnea     Past Surgical History:  Procedure Laterality Date   AGILE CAPSULE N/A 08/11/2014   Procedure: AGILE CAPSULE;  Surgeon: Robert M Rourk, MD;  Location: [facility];  Service: Endoscopy;  Laterality: N/A;  0700   BALLOON DILATION N/A 03/07/2021   Procedure: BALLOON DILATION;  Surgeon: Carver, Charles K,  DO;  Location: [facility];  Service: Endoscopy;  Laterality: N/A;   BIOPSY  03/07/2021   Procedure: BIOPSY;  Surgeon: Carver, Charles K, DO;  Location: [facility];  Service: Endoscopy;;   BLADDER SUSPENSION     CESAREAN SECTION     x2    CHOLECYSTECTOMY     COLONOSCOPY  2010   Dr. Rourk: tubular adenoma, few scattered diverticula   COLONOSCOPY N/A 10/01/2013   Dr. Rourk:Adequate preparation. Normal rectum. Normal colonic mucosa   COLONOSCOPY N/A 11/12/2018   sigmoid and descending colon diverticulosis. Two 4-5 mm polyps in ascending colon. (tubular adenomas). Surveillance in 5 years   DILATION AND CURETTAGE OF UTERUS     4   ESOPHAGOGASTRODUODENOSCOPY  2010   Dr. Rourk: normal esophagus, small hiatal hernia, questionable pale duodenal mucosa but negative for celiac sprue.    ESOPHAGOGASTRODUODENOSCOPY N/A 10/01/2013   Dr. Rourk:gastric polyps-status post biopsy. Otherwise, normal EGD. fundic gland polyp, negative H.pylori   ESOPHAGOGASTRODUODENOSCOPY (EGD) WITH PROPOFOL N/A 03/07/2021   Procedure: ESOPHAGOGASTRODUODENOSCOPY (EGD) WITH PROPOFOL;  Surgeon: Carver, Charles K, DO;  Location: [facility];  Service: Endoscopy;  Laterality: N/A;  12:00pm   GIVENS CAPSULE STUDY N/A 09/08/2014   Poor prep but overall unremarkable    KNEE ARTHROSCOPY W/ DEBRIDEMENT     LAPAROSCOPY ABDOMEN DIAGNOSTIC     lysis of adhesions       Cardiology Office Note  Date: 08/08/2021   ID: Michaela Peterson, DOB 03/09/1956, MRN [id number]  PCP:  Simpson, Margaret E, MD  Cardiologist:  Tamarick Kovalcik, MD Electrophysiologist:  None   Chief Complaint  Patient presents with   Chest discomfort    History of Present Illness: Michaela Peterson is a 65 y.o. female referred for cardiology consultation by Dr. Simpson for evaluation of chest pain.  She states that a few months ago she felt a left sided chest tightness that occurred when her blood sugar was low 1 evening.  This lasted for several minutes, she went to lay down and symptoms ultimately resolved.  She does have a feeling of exertional fatigue at baseline, also recurrent reflux and sensation of bloating and is nonexertional.  She has known gastroparesis and GERD in the setting of type 2 diabetes mellitus.  I reviewed her medications which are noted below.  She does have a history of statin myalgias, but is currently tolerating low-dose Crestor.  Remainder of her medications also reviewed.  She had lab work done in April.  She has not undergone any cardiac ischemic testing within the last 10 years at least.  She follows with Dr. Alva for treatment of severe OSA on CPAP, I reviewed the recent office note.   Past Medical History:  Diagnosis Date   Adnexal mass    Left; followed by Dr. Ferguson   Anemia, iron deficiency    Arthritis    Chronic low back pain    Diabetes mellitus, type 2 (HCC)    Essential hypertension    Gastroparesis    GERD (gastroesophageal reflux disease)    MS (multiple sclerosis) (HCC)    Nonalcoholic fatty liver disease    Obesity    Sleep apnea     Past Surgical History:  Procedure Laterality Date   AGILE CAPSULE N/A 08/11/2014   Procedure: AGILE CAPSULE;  Surgeon: Robert M Rourk, MD;  [facility]: [facility];  Service: Endoscopy;  Laterality: N/A;  0700   BALLOON DILATION N/A 03/07/2021   Procedure: BALLOON DILATION;  Surgeon: Carver, Charles K,  DO;  [facility];  Service: Endoscopy;  Laterality: N/A;   BIOPSY  03/07/2021   Procedure: BIOPSY;  Surgeon: Carver, Charles K, DO;  [facility];  Service: Endoscopy;;   BLADDER SUSPENSION     CESAREAN SECTION     x2    CHOLECYSTECTOMY     COLONOSCOPY  2010   Dr. Rourk: tubular adenoma, few scattered diverticula   COLONOSCOPY N/A 10/01/2013   Dr. Rourk:Adequate preparation. Normal rectum. Normal colonic mucosa   COLONOSCOPY N/A 11/12/2018   sigmoid and descending colon diverticulosis. Two 4-5 mm polyps in ascending colon. (tubular adenomas). Surveillance in 5 years   DILATION AND CURETTAGE OF UTERUS     4   ESOPHAGOGASTRODUODENOSCOPY  2010   Dr. Rourk: normal esophagus, small hiatal hernia, questionable pale duodenal mucosa but negative for celiac sprue.    ESOPHAGOGASTRODUODENOSCOPY N/A 10/01/2013   Dr. Rourk:gastric polyps-status post biopsy. Otherwise, normal EGD. fundic gland polyp, negative H.pylori   ESOPHAGOGASTRODUODENOSCOPY (EGD) WITH PROPOFOL N/A 03/07/2021   Procedure: ESOPHAGOGASTRODUODENOSCOPY (EGD) WITH PROPOFOL;  Surgeon: Carver, Charles K, DO;  Location: [facility];  Service: Endoscopy;  Laterality: N/A;  12:00pm   GIVENS CAPSULE STUDY N/A 09/08/2014   Poor prep but overall unremarkable    KNEE ARTHROSCOPY W/ DEBRIDEMENT     LAPAROSCOPY ABDOMEN DIAGNOSTIC     lysis of adhesions     Cardiology Office Note  Date: 08/08/2021   ID: Michaela Peterson, Michaela Peterson June 27, 1955, MRN [id number]  PCP:  [name], MD  Cardiologist:  Rozann Lesches, MD Electrophysiologist:  None   Chief Complaint  Patient presents with   Chest discomfort    History of Present Illness: Michaela Peterson is a 66 y.o. female referred for cardiology consultation by Dr. Moshe Cipro for evaluation of chest pain.  She states that a few months ago she felt a left sided chest tightness that occurred when her blood sugar was low 1 evening.  This lasted for several minutes, she went to lay down and symptoms ultimately resolved.  She does have a feeling of exertional fatigue at baseline, also recurrent reflux and sensation of bloating and is nonexertional.  She has known gastroparesis and GERD in the setting of type 2 diabetes mellitus.  I reviewed her medications which are noted below.  She does have a history of statin myalgias, but is currently tolerating low-dose Crestor.  Remainder of her medications also reviewed.  She had lab work done in April.  She has not undergone any cardiac ischemic testing within the last 10 years at least.  She follows with Dr. Elsworth Soho for treatment of severe OSA on CPAP, I reviewed the recent office note.   Past Medical History:  Diagnosis Date   Adnexal mass    Left; followed by Dr. [name]   Anemia, iron deficiency    Arthritis    Chronic low back pain    Diabetes mellitus, type 2 (HCC)    Essential hypertension    Gastroparesis    GERD (gastroesophageal reflux disease)    MS (multiple sclerosis) ([place])    Nonalcoholic fatty liver disease    Obesity    Sleep apnea     Past Surgical History:  Procedure Laterality Date   AGILE CAPSULE N/A 08/11/2014   Procedure: AGILE CAPSULE;  Surgeon: Daneil Dolin, MD;  Location: [facility];  Service: Endoscopy;  Laterality: N/A;  0700   BALLOON DILATION N/A 03/07/2021   Procedure: BALLOON DILATION;  Surgeon: [name],  DO;  [facility];  Service: Endoscopy;  Laterality: N/A;   BIOPSY  03/07/2021   Procedure: BIOPSY;  Surgeon: [name], DO;  [facility];  Service: Endoscopy;;   BLADDER SUSPENSION     CESAREAN SECTION     x2    CHOLECYSTECTOMY     COLONOSCOPY  2010   Dr. [name]: tubular adenoma, few scattered diverticula   COLONOSCOPY N/A 10/01/2013   Dr. Pixie Casino preparation. Normal rectum. Normal colonic mucosa   COLONOSCOPY N/A 11/12/2018   sigmoid and descending colon diverticulosis. Two 4-5 mm polyps in ascending colon. (tubular adenomas). Surveillance in 5 years   DILATION AND CURETTAGE OF UTERUS     4   ESOPHAGOGASTRODUODENOSCOPY  2010   Dr. [name]: normal esophagus, small hiatal hernia, questionable pale duodenal mucosa but negative for celiac sprue.    ESOPHAGOGASTRODUODENOSCOPY N/A 10/01/2013   Dr. Rourk:gastric polyps-status post biopsy. Otherwise, normal EGD. fundic gland polyp, negative H.pylori   ESOPHAGOGASTRODUODENOSCOPY (EGD) WITH PROPOFOL N/A 03/07/2021   Procedure: ESOPHAGOGASTRODUODENOSCOPY (EGD) WITH PROPOFOL;  Surgeon: [name], DO;  [facility];  Service: Endoscopy;  Laterality: N/A;  12:00pm   GIVENS CAPSULE STUDY N/A 09/08/2014   Poor prep but overall unremarkable    KNEE ARTHROSCOPY W/ DEBRIDEMENT     LAPAROSCOPY ABDOMEN DIAGNOSTIC     lysis of adhesions

## 2021-08-08 ENCOUNTER — Ambulatory Visit: Payer: Medicare Other | Admitting: Cardiology

## 2021-08-08 ENCOUNTER — Encounter: Payer: Self-pay | Admitting: Cardiology

## 2021-08-08 VITALS — BP 128/68 | HR 78 | Ht 60.0 in | Wt 181.8 lb

## 2021-08-08 DIAGNOSIS — G4733 Obstructive sleep apnea (adult) (pediatric): Secondary | ICD-10-CM

## 2021-08-08 DIAGNOSIS — I10 Essential (primary) hypertension: Secondary | ICD-10-CM

## 2021-08-08 DIAGNOSIS — R079 Chest pain, unspecified: Secondary | ICD-10-CM

## 2021-08-08 DIAGNOSIS — R011 Cardiac murmur, unspecified: Secondary | ICD-10-CM | POA: Diagnosis not present

## 2021-08-08 NOTE — Patient Instructions (Signed)
Medication Instructions:  Your physician recommends that you continue on your current medications as directed. Please refer to the Current Medication list given to you today.   Labwork: none  Testing/Procedures: Your physician has requested that you have en exercise stress myoview. For further information please visit [email or web address]. Please follow instruction sheet, as given.  Your physician has requested that you have an echocardiogram. Echocardiography is a painless test that uses sound waves to create images of your heart. It provides your doctor with information about the size and shape of your heart and how well your heart's chambers and valves are working. This procedure takes approximately one hour. There are no restrictions for this procedure.   Follow-Up:  Your physician recommends that you schedule a follow-up appointment in: Follow Up Pending  Any Other Special Instructions Will Be Listed Below (If Applicable).  If you need a refill on your cardiac medications before your next appointment, please call your pharmacy.  

## 2021-08-16 ENCOUNTER — Encounter: Payer: Self-pay | Admitting: Diagnostic Neuroimaging

## 2021-08-16 ENCOUNTER — Ambulatory Visit: Payer: Medicare Other | Admitting: Diagnostic Neuroimaging

## 2021-08-16 VITALS — BP 142/73 | HR 89 | Ht 60.0 in | Wt 180.0 lb

## 2021-08-16 DIAGNOSIS — M79601 Pain in right arm: Secondary | ICD-10-CM | POA: Diagnosis not present

## 2021-08-16 NOTE — Progress Notes (Signed)
[facility]  PATIENT: Michaela Peterson DOB: 1955-04-07  REFERRING CLINICIAN: [name], MD HISTORY FROM: patient  REASON FOR VISIT: new consult   HISTORICAL  CHIEF COMPLAINT:  Chief Complaint  Patient presents with   Multiple Sclerosis    Rm 7 Est pt, MS FU  "having a little numbness down from shoulder to right hand, was told I have bulging disks in my neck; xray showed a cyst in left back- Dr Christella Noa; sometimes I have tingling in knees - told I need knee replacements"    HISTORY OF PRESENT ILLNESS:   UPDATE (08/16/21, VRP): Since last visit, HERE FOR NEW ISSUE. 1 month of right arm numbness, pain (shoulder to hand) Tried prednisone without benefit. No weakness.  UPDATE (05/21/18, VRP): Since last visit, here for evaluation of headaches.  Patient reports some sinus pressure, tension and pressure headache in November 2019.  She was diagnosed with sinus infection treated with antibiotics.  Symptoms improved.  Now patient having milder headaches intermittently.  Sometimes sensitive to light.  No nausea, sensitive to sound.  She feels that cleaning products seem to aggravate her headaches.  Patient has had similar type but less severe headaches as far back as 1990.    No progression of MS symptoms, numbness, weakness, balance problems.  PRIOR HPI (08/13/17, CM):  65 year old female returns for yearly followup.  She has a history of multiple sclerosis without significant clinical progression. Last MRI of the brain in  2011 did not show progression from previous, she is not interested in resuming any MS treatment even after discussing the importance of maintaining her current functional status.  She was diagnosed in 2004 on the basis of demyelinating plaques in the cervical spine and typical CSF findings. She was initially treated with Rebif but discontinued the drug due to her injection site reactions in April of 2005. She has not been on disease modifying treatment  since that time, she reports some numbness and tingling in the feet which she feels has been stable for many years. She has bilateral knee pain and recently had injections. She has been told she needs total knee replacements. She is also a diabetic which is not in good control with most recent hemoglobin A1c over 7 .She also  has hypertension. She denies any double vision, dysarthria, dysphagia, bowel or bladder difficulties. She has not had any falls. She denies any balance issues.  She is not walking for exercise. She has an intolerance to heat. She returns for reevaluation    REVIEW OF SYSTEMS: Full 14 system review of systems performed and negative with exception of: Snoring restless legs feeling hot aching muscles pain.   ALLERGIES: Allergies  Allergen Reactions   Gabapentin Shortness Of Breath   Ace Inhibitors Cough   Aspirin     stomach upset with high doses   Oxycodone Hives   Penicillins Hives    Did it involve swelling of the face/tongue/throat, SOB, or low BP? No Did it involve sudden or severe rash/hives, skin peeling, or any reaction on the inside of your mouth or nose? No Did you need to seek medical attention at a hospital or doctor's office? Unknown When did it last happen?      Childhood allergy If all above answers are "NO", may proceed with cephalosporin use.     Percocet [Oxycodone-Acetaminophen] Hives   Rebif [Interferon Beta-1a] Other (See Comments)    Bloated and sick on the stomach   Statins Other (See Comments)  NAFLD Muscle pain    HOME MEDICATIONS: Outpatient Medications Prior to Visit  Medication Sig Dispense Refill   aspirin EC 81 MG tablet Take 81 mg by mouth daily.     azelastine (ASTELIN) 0.1 % nasal spray Place 2 sprays into both nostrils 2 (two) times daily. Use in each nostril as directed 30 mL 12   Blood Glucose Monitoring Suppl (ONE TOUCH ULTRA 2) w/Device KIT USE TO TEST ONCE DAILY 1 kit 0   Cholecalciferol (VITAMIN D3) 50 MCG (2000 UT)  TABS Take 2,000 Units by mouth daily.     fluorometholone (FML) 0.1 % ophthalmic suspension Place 1 drop into both eyes 3 (three) times daily.     fluticasone (FLONASE) 50 MCG/ACT nasal spray USE 2 SPRAYS IN BOTH  NOSTRILS DAILY 32 g 2   glipiZIDE (GLUCOTROL) 10 MG tablet TAKE 1 TABLET BY MOUTH  TWICE DAILY BEFORE MEALS 180 tablet 3   glucose blood (ONETOUCH ULTRA) test strip USE TO TEST ONCE DAILY 100 strip 3   ibuprofen (ADVIL) 200 MG tablet Take 800 mg by mouth every 6 (six) hours as needed (Knee and shoulder pain).     Lancets (ONETOUCH DELICA PLUS YQIHKV42V) MISC Test once daily DX e11.9 100 each 3   metFORMIN (GLUCOPHAGE) 1000 MG tablet TAKE 1 TABLET BY MOUTH  TWICE DAILY WITH MEALS 180 tablet 3   montelukast (SINGULAIR) 10 MG tablet TAKE 1 TABLET BY MOUTH AT  BEDTIME 100 tablet 2   NIFEdipine (ADALAT CC) 90 MG 24 hr tablet TAKE 1 TABLET BY MOUTH  DAILY 90 tablet 3   olmesartan (BENICAR) 40 MG tablet Take 1 tablet (40 mg total) by mouth daily. 30 tablet 2   ondansetron (ZOFRAN) 4 MG tablet Take 1 tablet (4 mg total) by mouth every 8 (eight) hours as needed for nausea or vomiting. 12 tablet 0   pantoprazole (PROTONIX) 40 MG tablet TAKE 1 TABLET BY MOUTH  DAILY BEFORE BREAKFAST 90 tablet 3   Polyethyl Glycol-Propyl Glycol (SYSTANE) 0.4-0.3 % SOLN Place 1 drop into both eyes 2 (two) times daily as needed (Dry eye).     rosuvastatin (CRESTOR) 5 MG tablet TAKE ONE TABLET BY MOUTH EVERY MONDAY AND FRIDAY 26 tablet 4   spironolactone (ALDACTONE) 50 MG tablet TAKE 1 TABLET BY MOUTH DAILY 90 tablet 3   triamcinolone cream (KENALOG) 0.1 % Apply 1 application topically daily as needed (rash). 30 g 0   No facility-administered medications prior to visit.    PAST MEDICAL HISTORY: Past Medical History:  Diagnosis Date   Adnexal mass    Left; followed by Dr. [name]   Anemia, iron deficiency    Arthritis    Chronic low back pain    Diabetes mellitus, type 2 (HCC)    Essential hypertension     Gastroparesis    GERD (gastroesophageal reflux disease)    MS (multiple sclerosis) ([place])    Nonalcoholic fatty liver disease    Obesity    Sleep apnea     PAST SURGICAL HISTORY: Past Surgical History:  Procedure Laterality Date   AGILE CAPSULE N/A 08/11/2014   Procedure: AGILE CAPSULE;  Surgeon: Daneil Dolin, MD;  [facility];  Service: Endoscopy;  Laterality: N/A;  0700   BALLOON DILATION N/A 03/07/2021   Procedure: BALLOON DILATION;  Surgeon: [name], DO;  [facility];  Service: Endoscopy;  Laterality: N/A;   BIOPSY  03/07/2021   Procedure: BIOPSY;  Surgeon: [name], DO;  [facility];  Service: Endoscopy;;   BLADDER SUSPENSION     CESAREAN SECTION     x2    CHOLECYSTECTOMY     COLONOSCOPY  2010   Dr. [name]: tubular adenoma, few scattered diverticula   COLONOSCOPY N/A 10/01/2013   Dr. Pixie Casino preparation. Normal rectum. Normal colonic mucosa   COLONOSCOPY N/A 11/12/2018   sigmoid and descending colon diverticulosis. Two 4-5 mm polyps in ascending colon. (tubular adenomas). Surveillance in 5 years   DILATION AND CURETTAGE OF UTERUS     4   ESOPHAGOGASTRODUODENOSCOPY  2010   Dr. [name]: normal esophagus, small hiatal hernia, questionable pale duodenal mucosa but negative for celiac sprue.    ESOPHAGOGASTRODUODENOSCOPY N/A 10/01/2013   Dr. Rourk:gastric polyps-status post biopsy. Otherwise, normal EGD. fundic gland polyp, negative H.pylori   ESOPHAGOGASTRODUODENOSCOPY (EGD) WITH PROPOFOL N/A 03/07/2021   Procedure: ESOPHAGOGASTRODUODENOSCOPY (EGD) WITH PROPOFOL;  Surgeon: [name], DO;  [facility];  Service: Endoscopy;  Laterality: N/A;  12:00pm   GIVENS CAPSULE STUDY N/A 09/08/2014   Poor prep but overall unremarkable    KNEE ARTHROSCOPY W/ DEBRIDEMENT     LAPAROSCOPY ABDOMEN DIAGNOSTIC     lysis of adhesions   POLYPECTOMY  11/12/2018   Procedure: POLYPECTOMY;  Surgeon: Daneil Dolin, MD;  [facility]  [facility];  Service: Endoscopy;;   ROTATOR CUFF REPAIR     TENDON LENGTHENING     Left wrist   UMBILICAL HERNIA REPAIR      FAMILY HISTORY: Family History  Problem Relation Age of Onset   Heart failure Mother    Hypertension Mother    Arthritis Mother    Stroke Father    Hypertension Sister    Diabetes Sister    Heart disease Sister    Kidney failure Sister    Hypertension Sister    Hypertension Brother    Mental illness Brother    Hypertension Son    Colon cancer Maternal Aunt     SOCIAL HISTORY: Social History   Socioeconomic History   Marital status: Married    Spouse name: [name]    Number of children: 4   Years of education: 13   Highest education level: Not on file  Occupational History   Occupation: unemployed     Fish farm manager: UNEMPLOYED  Tobacco Use   Smoking status: Never   Smokeless tobacco: Never   Tobacco comments:    patient lives with a smoker   Vaping Use   Vaping Use: Never used  Substance and Sexual Activity   Alcohol use: No   Drug use: No   Sexual activity: Yes    Birth control/protection: Post-menopausal  Other Topics Concern   Not on file  Social History Narrative   Patient lives at home with husband [name].    Patient has 4 children.    Patient has a some college.    Patient is right handed.    Patient is currently not working.    Social Determinants of Health   Financial Resource Strain: Low Risk    Difficulty of Paying Living Expenses: Not hard at all  Food Insecurity: No Food Insecurity   Worried About Charity fundraiser in the Last Year: Never true   [place] in the Last Year: Never true  Transportation Needs: No Transportation Needs   Lack of Transportation (Medical): No   Lack of Transportation (Non-Medical): No  Physical Activity: Insufficiently Active   Days of Exercise per Week: 2 days  Minutes of Exercise per Session: 20 min  Stress: No Stress Concern Present   Feeling of Stress : Only a little  Social  Connections: Engineer, building services of Communication with Friends and Family: More than three times a week   Frequency of Social Gatherings with Friends and Family: More than three times a week   Attends Religious Services: More than 4 times per year   Active Member of [employer] or Organizations: Yes   Attends Music therapist: More than 4 times per year   Marital Status: Married  Human resources officer Violence: Not At Risk   Fear of Current or Ex-Partner: No   Emotionally Abused: No   Physically Abused: No   Sexually Abused: No     PHYSICAL EXAM  GENERAL EXAM/CONSTITUTIONAL: Vitals:  Vitals:   08/16/21 1024  BP: (!) 142/73  Pulse: 89  Weight: 180 lb (81.6 kg)  Height: 5' (1.524 m)   Body mass index is 35.15 kg/m. Wt Readings from Last 3 Encounters:  08/16/21 180 lb (81.6 kg)  08/08/21 181 lb 12.8 oz (82.5 kg)  08/02/21 180 lb 6.4 oz (81.8 kg)   Patient is in no distress; well developed, nourished and groomed; neck is supple  CARDIOVASCULAR: Examination of carotid arteries is normal; no carotid bruits Regular rate and rhythm, no murmurs Examination of peripheral vascular system by observation and palpation is normal  EYES: Ophthalmoscopic exam of optic discs and posterior segments is normal; no papilledema or hemorrhages No results found.   MUSCULOSKELETAL: Gait, strength, tone, movements noted in Neurologic exam below  NEUROLOGIC: MENTAL STATUS:      View : No data to display.         awake, alert, oriented to person, place and time recent and remote memory intact normal attention and concentration language fluent, comprehension intact, naming intact fund of knowledge appropriate  CRANIAL NERVE:  2nd - no papilledema on fundoscopic exam 2nd, 3rd, 4th, 6th - pupils equal and reactive to light, visual fields full to confrontation, extraocular muscles intact, no nystagmus 5th - facial sensation symmetric 7th - facial strength symmetric 8th  - hearing intact 9th - palate elevates symmetrically, uvula midline 11th - shoulder shrug symmetric 12th - tongue protrusion midline  MOTOR:  normal bulk and tone, full strength in the BUE, BLE  SENSORY:  normal and symmetric to light touch  COORDINATION:  finger-nose-finger, fine finger movements normal  REFLEXES:  deep tendon reflexes TRACE and symmetric  GAIT/STATION:  narrow based gait     DIAGNOSTIC DATA (LABS, IMAGING, TESTING) - I reviewed patient records, labs, notes, testing and imaging myself where available.  Lab Results  Component Value Date   WBC 9.2 02/10/2021   HGB 12.4 02/10/2021   HCT 37.1 02/10/2021   MCV 85 02/10/2021   PLT 406 02/10/2021      Component Value Date/Time   NA 144 06/26/2021 0956   K 4.7 06/26/2021 0956   CL 109 (H) 06/26/2021 0956   CO2 20 06/26/2021 0956   GLUCOSE 101 (H) 06/26/2021 0956   GLUCOSE 155 (H) 03/06/2021 1541   BUN 23 06/26/2021 0956   CREATININE 0.96 06/26/2021 0956   CREATININE 0.91 10/29/2019 1013   CALCIUM 10.0 06/26/2021 0956   PROT 7.5 06/26/2021 0956   ALBUMIN 4.6 06/26/2021 0956   AST 13 06/26/2021 0956   ALT 16 06/26/2021 0956   ALKPHOS 97 06/26/2021 0956   BILITOT 0.3 06/26/2021 0956   GFRNONAA 56 (L) 03/06/2021 1541  GFRNONAA 67 10/29/2019 1013   GFRAA 64 03/25/2020 0839   GFRAA 78 10/29/2019 1013   Lab Results  Component Value Date   CHOL 166 04/07/2021   HDL 63 04/07/2021   LDLCALC 91 04/07/2021   TRIG 63 04/07/2021   CHOLHDL 2.6 04/07/2021   Lab Results  Component Value Date   HGBA1C 7.2 (H) 06/26/2021   Lab Results  Component Value Date   VITAMINB12 325 02/10/2018   Lab Results  Component Value Date   TSH 2.680 08/24/2020    04/13/09 MRI brain  - Examination precisely stable when compared to 07/02/2005. Scattered foci of abnormal white matter signal on both cerebral hemispheres consistent with the clinical diagnosis of multiple sclerosis.  No new or progressive disease.   -  Mild Chiari malformation, unchanged.  10/25/16 MRI cervical spine 1. Moderate left C5-6 neural foraminal stenosis and mild left C3-4 and C4-5 foraminal stenosis. 2. No central spinal canal stenosis.    ASSESSMENT AND PLAN  66 y.o. year old female here with:  Dx:  1. Right arm pain      PLAN:  RIGHT SHOULDER PAIN (radiating to right arm / hand) - likely musculoskeletal issue - continue OTC meds and creams - consider OT evaluation - if not improving after 4-6 weeks, then consider MRI cervical spine and EMG/NCS  TENSION HEADACHES (? Some migraine features) - stable, mild  MULTIPLE SCLEROSIS - stable; patient declines any DMT or MRI monitoring  No follow-ups on file.    Penni Bombard, MD 06/14/8097, 83:38 AM Certified in Neurology, Neurophysiology and Neuroimaging  [facility] [address] [place], [place] [address] [phone]

## 2021-08-16 NOTE — Patient Instructions (Signed)
RIGHT SHOULDER PAIN (radiating to right arm / hand) - likely musculoskeletal issue - continue OTC meds and creams - consider OT evaluation - if not improving after 4-6 weeks, then consider MRI cervical spine and EMG/NCS

## 2021-08-21 ENCOUNTER — Encounter (HOSPITAL_BASED_OUTPATIENT_CLINIC_OR_DEPARTMENT_OTHER)
Admission: RE | Admit: 2021-08-21 | Discharge: 2021-08-21 | Disposition: A | Discharge status: Home / Self Care | Payer: Medicare Other | Source: Ambulatory Visit | Attending: Cardiology | Admitting: Cardiology

## 2021-08-21 ENCOUNTER — Ambulatory Visit (HOSPITAL_COMMUNITY)
Admission: RE | Admit: 2021-08-21 | Discharge: 2021-08-21 | Disposition: A | Discharge status: Home / Self Care | Payer: Medicare Other | Source: Ambulatory Visit | Attending: Cardiology | Admitting: Cardiology

## 2021-08-21 DIAGNOSIS — R079 Chest pain, unspecified: Secondary | ICD-10-CM | POA: Insufficient documentation

## 2021-08-21 DIAGNOSIS — E785 Hyperlipidemia, unspecified: Secondary | ICD-10-CM | POA: Diagnosis not present

## 2021-08-21 DIAGNOSIS — E119 Type 2 diabetes mellitus without complications: Secondary | ICD-10-CM | POA: Insufficient documentation

## 2021-08-21 DIAGNOSIS — I10 Essential (primary) hypertension: Secondary | ICD-10-CM | POA: Diagnosis not present

## 2021-08-21 DIAGNOSIS — R011 Cardiac murmur, unspecified: Secondary | ICD-10-CM | POA: Insufficient documentation

## 2021-08-21 DIAGNOSIS — G35 Multiple sclerosis: Secondary | ICD-10-CM | POA: Insufficient documentation

## 2021-08-21 LAB — NM MYOCAR MULTI W/SPECT W/WALL MOTION / EF
LV dias vol: 50 mL (ref 46–106)
LV sys vol: 15 mL
Nuc Stress EF: 70 %
Peak HR: 83 {beats}/min
RATE: 0.7
Rest HR: 64 {beats}/min
Rest Nuclear Isotope Dose: 8.4 mCi
SDS: 4
SRS: 3
SSS: 7
ST Depression (mm): 0 mm
Stress Nuclear Isotope Dose: 27 mCi
TID: 1.02

## 2021-08-21 LAB — ECHOCARDIOGRAM COMPLETE
AR max vel: 2.26 cm2
AV Area VTI: 2.66 cm2
AV Area mean vel: 2.17 cm2
AV Mean grad: 6 mmHg
AV Peak grad: 12.7 mmHg
Ao pk vel: 1.78 m/s
Area-P 1/2: 2.02 cm2
Calc EF: 53.6 %
MV VTI: 2.26 cm2
S' Lateral: 1.8 cm
Single Plane A2C EF: 53 %
Single Plane A4C EF: 59.2 %

## 2021-08-21 MED ORDER — REGADENOSON 0.4 MG/5ML IV SOLN
INTRAVENOUS | Status: AC
  Administered 2021-08-21: 0.4 mg
  Filled 2021-08-21: qty 5

## 2021-08-21 MED ORDER — TECHNETIUM TC 99M TETROFOSMIN IV KIT
10.0000 | PACK | Freq: Once | INTRAVENOUS | Status: AC | PRN
  Administered 2021-08-21: 8.43 via INTRAVENOUS

## 2021-08-21 MED ORDER — SODIUM CHLORIDE FLUSH 0.9 % IV SOLN
INTRAVENOUS | Status: AC
  Administered 2021-08-21: 10 mL
  Filled 2021-08-21: qty 10

## 2021-08-21 MED ORDER — TECHNETIUM TC 99M TETROFOSMIN IV KIT
30.0000 | PACK | Freq: Once | INTRAVENOUS | Status: AC | PRN
  Administered 2021-08-21: 27 via INTRAVENOUS

## 2021-08-23 ENCOUNTER — Ambulatory Visit (INDEPENDENT_AMBULATORY_CARE_PROVIDER_SITE_OTHER): Payer: Medicare Other | Admitting: Family Medicine

## 2021-08-23 ENCOUNTER — Encounter: Payer: Self-pay | Admitting: Family Medicine

## 2021-08-23 VITALS — BP 144/76 | HR 75 | Ht 60.0 in | Wt 179.1 lb

## 2021-08-23 DIAGNOSIS — E785 Hyperlipidemia, unspecified: Secondary | ICD-10-CM

## 2021-08-23 DIAGNOSIS — I10 Essential (primary) hypertension: Secondary | ICD-10-CM | POA: Diagnosis not present

## 2021-08-23 DIAGNOSIS — E1149 Type 2 diabetes mellitus with other diabetic neurological complication: Secondary | ICD-10-CM

## 2021-08-23 MED ORDER — SPIRONOLACTONE 50 MG PO TABS: ORAL_TABLET | ORAL | 1 refills | Status: DC

## 2021-08-23 NOTE — Patient Instructions (Addendum)
F/u early August, re eval bP , call if you need me sooner  New higher dose of spironolactone 50 mg is ONE AND A hALF tablets once daily   Please get fasting lipid, cmp and EGFr, HBA1C, TSH , Vit D and CBC  and micoalb 3 to 5  days before next appt  It is important that you exercise regularly at least 30 minutes 5 times a week. If you develop chest pain, have severe difficulty breathing, or feel very tired, stop exercising immediately and seek medical attention   Think about what you will eat, plan ahead. Choose " clean, green, fresh or frozen" over canned, processed or packaged foods which are more sugary, salty and fatty. 70 to 75% of food eaten should be vegetables and fruit. Three meals at set times with snacks allowed between meals, but they must be fruit or vegetables. Aim to eat over a 12 hour period , example 7 am to 7 pm, and STOP after  your last meal of the day. Drink water,generally about 64 ounces per day, no other drink is as healthy. Fruit juice is best enjoyed in a healthy way, by EATING the fruit. Thanks for choosing [facility], we consider it a privelige to serve you.

## 2021-08-30 ENCOUNTER — Ambulatory Visit (HOSPITAL_BASED_OUTPATIENT_CLINIC_OR_DEPARTMENT_OTHER): Payer: Medicare Other | Attending: Pulmonary Disease | Admitting: Pulmonary Disease

## 2021-08-30 DIAGNOSIS — G4733 Obstructive sleep apnea (adult) (pediatric): Secondary | ICD-10-CM

## 2021-09-04 ENCOUNTER — Encounter: Payer: Self-pay | Admitting: Family Medicine

## 2021-09-04 NOTE — Progress Notes (Signed)
Michaela Peterson     MRN: [id number]      DOB: 13-Jan-1956   HPI Michaela Peterson is here for follow up and re-evaluation of chronic medical conditions, medication management and review of any available recent lab and radiology data.  Preventive health is updated, specifically  Cancer screening and Immunization.   Questions or concerns regarding consultations or procedures which the PT has had in the interim are  addressed. The PT denies any adverse reactions to current medications since the last visit.  There are no new concerns.  There are no specific complaints  Denies polyuria, polydipsia, blurred vision , or hypoglycemic episodes.   ROS Denies recent fever or chills. Denies sinus pressure, nasal congestion, ear pain or sore throat. Denies chest congestion, productive cough or wheezing. Denies chest pains, palpitations and leg swelling Denies abdominal pain, nausea, vomiting,diarrhea or constipation.   Denies dysuria, frequency, hesitancy or incontinence. Denies  uncontrolled joint pain, swelling and limitation in mobility. Denies headaches, seizures, numbness, or tingling. Denies depression, anxiety or insomnia. Denies skin break down or rash.   PE  BP (!) 144/76   Pulse 75   Ht 5' (1.524 m)   Wt 179 lb 1.9 oz (81.2 kg)   LMP 08/18/2012   SpO2 96%   BMI 34.98 kg/m   Patient alert and oriented and in no cardiopulmonary distress.  HEENT: No facial asymmetry, EOMI,     Neck supple .  Chest: Clear to auscultation bilaterally.  CVS: S1, S2 no murmurs, no S3.Regular rate.  ABD: Soft non tender.   Ext: No edema  MS: Adequate ROM spine, shoulders, hips and knees.  Skin: Intact, no ulcerations or rash noted.  Psych: Good eye contact, normal affect. Memory intact not anxious or depressed appearing.  CNS: CN 2-12 intact, power,  normal throughout.no focal deficits noted.   Assessment & Plan  Hypertension goal BP (blood pressure) < 140/90 Uncontrolled , higher dose of  spironolactone needed DASH diet and commitment to daily physical activity for a minimum of 30 minutes discussed and encouraged, as a part of hypertension management. The importance of attaining a healthy weight is also discussed.     08/30/2021    2:30 PM 08/23/2021   10:14 AM 08/23/2021   10:08 AM 08/16/2021   10:24 AM 08/08/2021   10:36 AM 08/02/2021   10:33 AM 07/13/2021   10:16 AM  BP/Weight  Systolic BP  144 409 142 128 132 137  Diastolic BP  76 84 73 68 84 61  Wt. (Lbs) 179  179.12 180 181.8 180.4 179.12  BMI 34.96 kg/m2  34.98 kg/m2 35.15 kg/m2 35.51 kg/m2 35.23 kg/m2 34.98 kg/m2       Type 2 diabetes mellitus with neurological complications [facility]) Michaela Peterson is reminded of the importance of commitment to daily physical activity for 30 minutes or more, as able and the need to limit carbohydrate intake to 30 to 60 grams per meal to help with blood sugar control.   The need to take medication as prescribed, test blood sugar as directed, and to call between visits if there is a concern that blood sugar is uncontrolled is also discussed.   Michaela Peterson is reminded of the importance of daily foot exam, annual eye examination, and good blood sugar, blood pressure and cholesterol control. Updated lab needed at/ before next visit.      Latest Ref Rng & Units 06/26/2021    9:56 AM 04/07/2021   11:18 AM 03/06/2021  3:41 PM 12/29/2020   10:55 AM 12/16/2020    9:06 AM  Diabetic Labs  HbA1c 4.8 - 5.6 % 7.2  8.5   7.5    Chol 100 - 199 mg/dL  762      HDL >83 mg/dL  63      Calc LDL 0 - 99 mg/dL  91      Triglycerides 0 - 149 mg/dL  63      Creatinine 1.51 - 1.00 mg/dL 7.61  6.07  3.71   0.62       08/30/2021    2:30 PM 08/23/2021   10:14 AM 08/23/2021   10:08 AM 08/16/2021   10:24 AM 08/08/2021   10:36 AM 08/02/2021   10:33 AM 07/13/2021   10:16 AM  BP/Weight  Systolic BP  144 694 142 128 132 137  Diastolic BP  76 84 73 68 84 61  Wt. (Lbs) 179  179.12 180 181.8 180.4 179.12  BMI 34.96  kg/m2  34.98 kg/m2 35.15 kg/m2 35.51 kg/m2 35.23 kg/m2 34.98 kg/m2      Latest Ref Rng & Units 08/23/2021   10:00 AM 02/22/2021   12:00 AM  Foot/eye exam completion dates  Eye Exam No Retinopathy  No Retinopathy      Foot Form Completion  Done      This result is from an external source.        Morbid obesity (HCC)  Patient re-educated about  the importance of commitment to a  minimum of 150 minutes of exercise per week as able.  The importance of healthy food choices with portion control discussed, as well as eating regularly and within a 12 hour window most days. The need to choose "clean , green" food 50 to 75% of the time is discussed, as well as to make water the primary drink and set a goal of 64 ounces water daily.       08/30/2021    2:30 PM 08/23/2021   10:08 AM 08/16/2021   10:24 AM  Weight /BMI  Weight 179 lb 179 lb 1.9 oz 180 lb  Height 5' (1.524 m) 5' (1.524 m) 5' (1.524 m)  BMI 34.96 kg/m2 34.98 kg/m2 35.15 kg/m2      Hyperlipidemia LDL goal <100 Hyperlipidemia:Low fat diet discussed and encouraged.   Lipid Panel  Lab Results  Component Value Date   CHOL 166 04/07/2021   HDL 63 04/07/2021   LDLCALC 91 04/07/2021   TRIG 63 04/07/2021   CHOLHDL 2.6 04/07/2021   Updated lab needed at/ before next visit. Controlled when last checked

## 2021-09-19 ENCOUNTER — Telehealth: Payer: Self-pay | Admitting: Family Medicine

## 2021-09-19 NOTE — Telephone Encounter (Signed)
Pt called stating she has been coughing for 3 wks. States she is coughing up mucus & has taken Mucinex with no results. Wants to know if she can please get something for this?

## 2021-09-20 ENCOUNTER — Encounter: Payer: Self-pay | Admitting: Family Medicine

## 2021-09-20 ENCOUNTER — Ambulatory Visit (INDEPENDENT_AMBULATORY_CARE_PROVIDER_SITE_OTHER): Payer: Medicare Other | Admitting: Family Medicine

## 2021-09-20 VITALS — BP 153/77

## 2021-09-20 DIAGNOSIS — J209 Acute bronchitis, unspecified: Secondary | ICD-10-CM

## 2021-09-20 DIAGNOSIS — I10 Essential (primary) hypertension: Secondary | ICD-10-CM

## 2021-09-20 MED ORDER — AZITHROMYCIN 250 MG PO TABS: ORAL_TABLET | ORAL | 0 refills | Status: AC

## 2021-09-20 MED ORDER — BENZONATATE 100 MG PO CAPS: 100.0000 mg | ORAL_CAPSULE | Freq: Four times a day (QID) | ORAL | 0 refills | Status: DC | PRN

## 2021-09-20 MED ORDER — PROMETHAZINE-DM 6.25-15 MG/5ML PO SYRP: ORAL_SOLUTION | ORAL | 0 refills | Status: DC

## 2021-09-20 NOTE — Telephone Encounter (Signed)
Scheduled for phone visit today

## 2021-09-20 NOTE — Patient Instructions (Addendum)
Please reschedule follow up to next week Thurday or Friday, prefers morning appt if available, bring m,edications and bP cuff to visit  You are treated for  bronchits and 3 medications are prescribed, azithromycin, tessalon perles and phenergan dm    Please get fasting lipid, cmp and eGFr, hBA1C, TSH, vit D and urine microalb next week Monday or Tuesday  Thanks for choosing [facility], we consider it a privelige to serve you.

## 2021-09-20 NOTE — Progress Notes (Signed)
Virtual Visit via Video Note  I connected with [name] on 09/20/21 at 10:40 AM EDT by a video enabled telemedicine application and verified that I am speaking with the correct person using two identifiers.  Location: Patient: home Provider: office   I discussed the limitations of evaluation and management by telemedicine and the availability of in person appointments. The patient expressed understanding and agreed to proceed.  History of Present Illness: 3 week h/o cough worse at night, had runny nose and sinus pressure from June 20, used mucinex that has helped the sore throat slight    Observations/Objective:  BP (!) 153/77   LMP 08/18/2012  Good communication with no confusion and intact memory. Alert and oriented x 3 No signs of respiratory distress during speech  Assessment and Plan:  Acute bronchitis Z pack, tessalon perles and phenergan DM prescribed  Hypertension goal BP (blood pressure) < 140/90 Reported as elevated  DASH diet and commitment to daily physical activity for a minimum of 30 minutes discussed and encouraged, as a part of hypertension management. The importance of attaining a healthy weight is also discussed.     09/20/2021   11:37 AM 08/30/2021    2:30 PM 08/23/2021   10:14 AM 08/23/2021   10:08 AM 08/16/2021   10:24 AM 08/08/2021   10:36 AM 08/02/2021   10:33 AM  BP/Weight  Systolic BP 193  790 240 973 532 992  Diastolic BP 77  76 84 73 68 84  Wt. (Lbs)  179  179.12 180 181.8 180.4  BMI  34.96 kg/m2  34.98 kg/m2 35.15 kg/m2 35.51 kg/m2 35.23 kg/m2   Office eval in next 2 weeks with meds and BP cuff    Follow Up Instructions:    I discussed the assessment and treatment plan with the patient. The patient was provided an opportunity to ask questions and all were answered. The patient agreed with the plan and demonstrated an understanding of the instructions.   The patient was advised to call back or seek an in-person evaluation if the symptoms  worsen or if the condition fails to improve as anticipated.  I provided 12  minutes of non-face-to-face time during this encounter.   Tula Nakayama, MD

## 2021-09-25 ENCOUNTER — Encounter: Payer: Self-pay | Admitting: Family Medicine

## 2021-09-25 DIAGNOSIS — E785 Hyperlipidemia, unspecified: Secondary | ICD-10-CM | POA: Diagnosis not present

## 2021-09-25 DIAGNOSIS — I10 Essential (primary) hypertension: Secondary | ICD-10-CM | POA: Diagnosis not present

## 2021-09-25 DIAGNOSIS — J209 Acute bronchitis, unspecified: Secondary | ICD-10-CM | POA: Insufficient documentation

## 2021-09-25 NOTE — Assessment & Plan Note (Signed)
Reported as elevated  DASH diet and commitment to daily physical activity for a minimum of 30 minutes discussed and encouraged, as a part of hypertension management. The importance of attaining a healthy weight is also discussed.     09/20/2021   11:37 AM 08/30/2021    2:30 PM 08/23/2021   10:14 AM 08/23/2021   10:08 AM 08/16/2021   10:24 AM 08/08/2021   10:36 AM 08/02/2021   10:33 AM  BP/Weight  Systolic BP 298  473 085 694 370 052  Diastolic BP 77  76 84 73 68 84  Wt. (Lbs)  179  179.12 180 181.8 180.4  BMI  34.96 kg/m2  34.98 kg/m2 35.15 kg/m2 35.51 kg/m2 35.23 kg/m2   Office eval in next 2 weeks with meds and BP cuff

## 2021-09-25 NOTE — Assessment & Plan Note (Signed)
Z pack, tessalon perles and phenergan DM prescribed

## 2021-09-26 LAB — CMP14+EGFR
ALT: 13 IU/L (ref 0–32)
AST: 15 IU/L (ref 0–40)
Albumin/Globulin Ratio: 1.6 (ref 1.2–2.2)
Albumin: 4.5 g/dL (ref 3.9–4.9)
Alkaline Phosphatase: 95 IU/L (ref 44–121)
BUN/Creatinine Ratio: 20 (ref 12–28)
BUN: 22 mg/dL (ref 8–27)
Bilirubin Total: 0.2 mg/dL (ref 0.0–1.2)
CO2: 16 mmol/L — ABNORMAL LOW (ref 20–29)
Calcium: 9.8 mg/dL (ref 8.7–10.3)
Chloride: 108 mmol/L — ABNORMAL HIGH (ref 96–106)
Creatinine, Ser: 1.08 mg/dL — ABNORMAL HIGH (ref 0.57–1.00)
Globulin, Total: 2.8 g/dL (ref 1.5–4.5)
Glucose: 68 mg/dL — ABNORMAL LOW (ref 70–99)
Potassium: 4.5 mmol/L (ref 3.5–5.2)
Sodium: 141 mmol/L (ref 134–144)
Total Protein: 7.3 g/dL (ref 6.0–8.5)
eGFR: 57 mL/min/{1.73_m2} — ABNORMAL LOW (ref >59)

## 2021-09-26 LAB — LIPID PANEL
Chol/HDL Ratio: 2.1 ratio (ref 0.0–4.4)
Cholesterol, Total: 135 mg/dL (ref 100–199)
HDL: 63 mg/dL (ref >39)
LDL Chol Calc (NIH): 60 mg/dL (ref 0–99)
Triglycerides: 56 mg/dL (ref 0–149)
VLDL Cholesterol Cal: 12 mg/dL (ref 5–40)

## 2021-09-28 ENCOUNTER — Ambulatory Visit (INDEPENDENT_AMBULATORY_CARE_PROVIDER_SITE_OTHER): Payer: Medicare Other | Admitting: Family Medicine

## 2021-09-28 ENCOUNTER — Other Ambulatory Visit: Payer: Self-pay

## 2021-09-28 ENCOUNTER — Encounter: Payer: Self-pay | Admitting: Family Medicine

## 2021-09-28 VITALS — BP 138/78 | HR 87 | Ht 60.0 in | Wt 181.1 lb

## 2021-09-28 DIAGNOSIS — E785 Hyperlipidemia, unspecified: Secondary | ICD-10-CM | POA: Diagnosis not present

## 2021-09-28 DIAGNOSIS — H608X3 Other otitis externa, bilateral: Secondary | ICD-10-CM | POA: Diagnosis not present

## 2021-09-28 DIAGNOSIS — E1149 Type 2 diabetes mellitus with other diabetic neurological complication: Secondary | ICD-10-CM

## 2021-09-28 DIAGNOSIS — D509 Iron deficiency anemia, unspecified: Secondary | ICD-10-CM

## 2021-09-28 DIAGNOSIS — J029 Acute pharyngitis, unspecified: Secondary | ICD-10-CM

## 2021-09-28 DIAGNOSIS — I10 Essential (primary) hypertension: Secondary | ICD-10-CM

## 2021-09-28 LAB — POCT GLYCOSYLATED HEMOGLOBIN (HGB A1C): HbA1c, POC (controlled diabetic range): 7.4 % — AB (ref 0.0–7.0)

## 2021-09-28 MED ORDER — CIPRO HC 0.2-1 % OT SUSP: OTIC | 0 refills | Status: DC

## 2021-09-28 MED ORDER — OLMESARTAN MEDOXOMIL 40 MG PO TABS
40.0000 mg | ORAL_TABLET | Freq: Every day | ORAL | 3 refills | Status: DC
Start: 2021-09-28 — End: 2021-10-16

## 2021-09-28 NOTE — Patient Instructions (Addendum)
Annual exam 10/26 or after, call if you need me before  F/U early September, diabetic log and to re evaluate blood pressure  Higher dose  spironolacone 50 mg one twice daily  Blood sugar NEEDS to improve, also Blood pressure  Salt water gargles for throat, and ear drop at [facility] for itchy ears  Please put pills in pill box so that you take them faithfully as prescribed    Microalb today

## 2021-10-02 ENCOUNTER — Encounter: Payer: Self-pay | Admitting: Family Medicine

## 2021-10-02 DIAGNOSIS — J029 Acute pharyngitis, unspecified: Secondary | ICD-10-CM | POA: Insufficient documentation

## 2021-10-02 DIAGNOSIS — H6090 Unspecified otitis externa, unspecified ear: Secondary | ICD-10-CM | POA: Insufficient documentation

## 2021-10-02 NOTE — Assessment & Plan Note (Signed)
Uncontrolled, increase dose spironolactone DASH diet and commitment to daily physical activity for a minimum of 30 minutes discussed and encouraged, as a part of hypertension management. The importance of attaining a healthy weight is also discussed.     09/28/2021    9:50 AM 09/20/2021   11:37 AM 08/30/2021    2:30 PM 08/23/2021   10:14 AM 08/23/2021   10:08 AM 08/16/2021   10:24 AM 08/08/2021   10:36 AM  BP/Weight  Systolic BP 300 762  263 335 456 256  Diastolic BP 78 77  76 84 73 68  Wt. (Lbs) 181.12  179  179.12 180 181.8  BMI 35.37 kg/m2  34.96 kg/m2  34.98 kg/m2 35.15 kg/m2 35.51 kg/m2

## 2021-10-02 NOTE — Assessment & Plan Note (Signed)
Normal exam, no cervical adenopathy, advised salt water gargles, chronic complaint

## 2021-10-02 NOTE — Assessment & Plan Note (Signed)
Uncontrolled, needs additional medication but resisting, if diligent with diet and med management may be able to control with medication she currnetly ahs Close f/u with diabetic log [name] is reminded of the importance of commitment to daily physical activity for 30 minutes or more, as able and the need to limit carbohydrate intake to 30 to 60 grams per meal to help with blood sugar control.   The need to take medication as prescribed, test blood sugar as directed, and to call between visits if there is a concern that blood sugar is uncontrolled is also discussed.   [name] is reminded of the importance of daily foot exam, annual eye examination, and good blood sugar, blood pressure and cholesterol control.     Latest Ref Rng & Units 09/28/2021   10:48 AM 09/25/2021   10:29 AM 06/26/2021    9:56 AM 04/07/2021   11:18 AM 03/06/2021    3:41 PM  Diabetic Labs  HbA1c 0.0 - 7.0 % 7.4   7.2  8.5    Chol 100 - 199 mg/dL  135   166    HDL >39 mg/dL  63   63    Calc LDL 0 - 99 mg/dL  60   91    Triglycerides 0 - 149 mg/dL  56   63    Creatinine 0.57 - 1.00 mg/dL  1.08  0.96  1.13  1.10       09/28/2021    9:50 AM 09/20/2021   11:37 AM 08/30/2021    2:30 PM 08/23/2021   10:14 AM 08/23/2021   10:08 AM 08/16/2021   10:24 AM 08/08/2021   10:36 AM  BP/Weight  Systolic BP 485 462  703 500 938 182  Diastolic BP 78 77  76 84 73 68  Wt. (Lbs) 181.12  179  179.12 180 181.8  BMI 35.37 kg/m2  34.96 kg/m2  34.98 kg/m2 35.15 kg/m2 35.51 kg/m2      Latest Ref Rng & Units 08/23/2021   10:00 AM 02/22/2021   12:00 AM  Foot/eye exam completion dates  Eye Exam No Retinopathy  No Retinopathy      Foot Form Completion  Done      This result is from an external source.

## 2021-10-02 NOTE — Assessment & Plan Note (Signed)
Hyperlipidemia:Low fat diet discussed and encouraged.   Lipid Panel  Lab Results  Component Value Date   CHOL 135 09/25/2021   HDL 63 09/25/2021   LDLCALC 60 09/25/2021   TRIG 56 09/25/2021   CHOLHDL 2.1 09/25/2021   Controlled, no change in medication

## 2021-10-02 NOTE — Assessment & Plan Note (Signed)
  Patient re-educated about  the importance of commitment to a  minimum of 150 minutes of exercise per week as able.  The importance of healthy food choices with portion control discussed, as well as eating regularly and within a 12 hour window most days. The need to choose "clean , green" food 50 to 75% of the time is discussed, as well as to make water the primary drink and set a goal of 64 ounces water daily.       09/28/2021    9:50 AM 08/30/2021    2:30 PM 08/23/2021   10:08 AM  Weight /BMI  Weight 181 lb 1.9 oz 179 lb 179 lb 1.9 oz  Height 5' (1.524 m) 5' (1.524 m) 5' (1.524 m)  BMI 35.37 kg/m2 34.96 kg/m2 34.98 kg/m2

## 2021-10-02 NOTE — Progress Notes (Signed)
Michaela Peterson     MRN: [id number]      DOB: 11-09-1955   HPI Michaela Peterson is here for follow up and re-evaluation of chronic medical conditions, medication management and review of any available recent lab and radiology data.  Preventive health is updated, specifically  Cancer screening and Immunization.   Questions or concerns regarding consultations or procedures which the PT has had in the interim are  addressed. The PT denies any adverse reactions to current medications since the last visit.  C/o sore throat, persisting, no fever or chills C/o itchy ears, no hearingloss or drainage Fluctuating blood sugar ROS Denies recent fever or chills. Denies sinus pressure, nasal congestion, . Denies chest congestion, productive cough or wheezing. Denies chest pains, palpitations and leg swelling Denies abdominal pain, nausea, vomiting,diarrhea or constipation.   Denies dysuria, frequency, hesitancy or incontinence. Denies joint pain, swelling and limitation in mobility. Denies headaches, seizures, numbness, or tingling. Denies depression, anxiety or insomnia. Denies skin break down or rash.   PE  BP 138/78 (BP Location: Right Arm, Patient Position: Sitting, Cuff Size: Large)   Pulse 87   Ht 5' (1.524 m)   Wt 181 lb 1.9 oz (82.2 kg)   LMP 08/18/2012   SpO2 98%   BMI 35.37 kg/m   Patient alert and oriented and in no cardiopulmonary distress.  HEENT: No facial asymmetry, EOMI,     Neck supple .TM clear bilaterally, scaling and erythema of external ear canal, oropharynx no erythema or exudate  Chest: Clear to auscultation bilaterally.  CVS: S1, S2 no murmurs, no S3.Regular rate.  ABD: Soft non tender.   Ext: No edema  MS: Adequate ROM spine, shoulders, hips and knees.  Skin: Intact, no ulcerations or rash noted.  Psych: Good eye contact, normal affect. Memory intact not anxious or depressed appearing.  CNS: CN 2-12 intact, power,  normal throughout.no focal deficits  noted.   Assessment & Plan  Hypertension goal BP (blood pressure) < 140/90 Uncontrolled, increase dose spironolactone DASH diet and commitment to daily physical activity for a minimum of 30 minutes discussed and encouraged, as a part of hypertension management. The importance of attaining a healthy weight is also discussed.     09/28/2021    9:50 AM 09/20/2021   11:37 AM 08/30/2021    2:30 PM 08/23/2021   10:14 AM 08/23/2021   10:08 AM 08/16/2021   10:24 AM 08/08/2021   10:36 AM  BP/Weight  Systolic BP 010 932  355 732 202 542  Diastolic BP 78 77  76 84 73 68  Wt. (Lbs) 181.12  179  179.12 180 181.8  BMI 35.37 kg/m2  34.96 kg/m2  34.98 kg/m2 35.15 kg/m2 35.51 kg/m2       Morbid obesity (HCC)  Patient re-educated about  the importance of commitment to a  minimum of 150 minutes of exercise per week as able.  The importance of healthy food choices with portion control discussed, as well as eating regularly and within a 12 hour window most days. The need to choose "clean , green" food 50 to 75% of the time is discussed, as well as to make water the primary drink and set a goal of 64 ounces water daily.       09/28/2021    9:50 AM 08/30/2021    2:30 PM 08/23/2021   10:08 AM  Weight /BMI  Weight 181 lb 1.9 oz 179 lb 179 lb 1.9 oz  Height 5' (1.524 m) 5' (1.524  m) 5' (1.524 m)  BMI 35.37 kg/m2 34.96 kg/m2 34.98 kg/m2      Type 2 diabetes mellitus with neurological complications (HCC) Uncontrolled, needs additional medication but resisting, if diligent with diet and med management may be able to control with medication she currnetly ahs Close f/u with diabetic log Michaela Peterson is reminded of the importance of commitment to daily physical activity for 30 minutes or more, as able and the need to limit carbohydrate intake to 30 to 60 grams per meal to help with blood sugar control.   The need to take medication as prescribed, test blood sugar as directed, and to call between visits if  there is a concern that blood sugar is uncontrolled is also discussed.   Michaela Peterson is reminded of the importance of daily foot exam, annual eye examination, and good blood sugar, blood pressure and cholesterol control.     Latest Ref Rng & Units 09/28/2021   10:48 AM 09/25/2021   10:29 AM 06/26/2021    9:56 AM 04/07/2021   11:18 AM 03/06/2021    3:41 PM  Diabetic Labs  HbA1c 0.0 - 7.0 % 7.4   7.2  8.5    Chol 100 - 199 mg/dL  135   166    HDL >39 mg/dL  63   63    Calc LDL 0 - 99 mg/dL  60   91    Triglycerides 0 - 149 mg/dL  56   63    Creatinine 0.57 - 1.00 mg/dL  1.08  0.96  1.13  1.10       09/28/2021    9:50 AM 09/20/2021   11:37 AM 08/30/2021    2:30 PM 08/23/2021   10:14 AM 08/23/2021   10:08 AM 08/16/2021   10:24 AM 08/08/2021   10:36 AM  BP/Weight  Systolic BP 789 381  017 510 258 527  Diastolic BP 78 77  76 84 73 68  Wt. (Lbs) 181.12  179  179.12 180 181.8  BMI 35.37 kg/m2  34.96 kg/m2  34.98 kg/m2 35.15 kg/m2 35.51 kg/m2      Latest Ref Rng & Units 08/23/2021   10:00 AM 02/22/2021   12:00 AM  Foot/eye exam completion dates  Eye Exam No Retinopathy  No Retinopathy      Foot Form Completion  Done      This result is from an external source.        Hyperlipidemia LDL goal <100 Hyperlipidemia:Low fat diet discussed and encouraged.   Lipid Panel  Lab Results  Component Value Date   CHOL 135 09/25/2021   HDL 63 09/25/2021   LDLCALC 60 09/25/2021   TRIG 56 09/25/2021   CHOLHDL 2.1 09/25/2021   Controlled, no change in medication     Otitis externa Topical medication prescribed  Sore throat Normal exam, no cervical adenopathy, advised salt water gargles, chronic complaint

## 2021-10-02 NOTE — Assessment & Plan Note (Signed)
Topical medication prescribed

## 2021-10-06 ENCOUNTER — Ambulatory Visit (HOSPITAL_COMMUNITY): Payer: Medicare Other

## 2021-10-08 ENCOUNTER — Other Ambulatory Visit: Payer: Self-pay | Admitting: Family Medicine

## 2021-10-13 ENCOUNTER — Ambulatory Visit (HOSPITAL_COMMUNITY)
Admission: RE | Admit: 2021-10-13 | Discharge: 2021-10-13 | Disposition: A | Discharge status: Home / Self Care | Payer: Medicare Other | Source: Ambulatory Visit | Attending: Family Medicine | Admitting: Family Medicine

## 2021-10-13 ENCOUNTER — Ambulatory Visit (HOSPITAL_COMMUNITY): Payer: Medicare Other

## 2021-10-13 DIAGNOSIS — Z1231 Encounter for screening mammogram for malignant neoplasm of breast: Secondary | ICD-10-CM | POA: Insufficient documentation

## 2021-10-13 DIAGNOSIS — D509 Iron deficiency anemia, unspecified: Secondary | ICD-10-CM | POA: Diagnosis not present

## 2021-10-14 LAB — IRON,TIBC AND FERRITIN PANEL
Ferritin: 97 ng/mL (ref 15–150)
Iron Saturation: 13 % — ABNORMAL LOW (ref 15–55)
Iron: 43 ug/dL (ref 27–139)
Total Iron Binding Capacity: 326 ug/dL (ref 250–450)
UIBC: 283 ug/dL (ref 118–369)

## 2021-10-14 LAB — CBC WITH DIFFERENTIAL/PLATELET
Basophils Absolute: 0.1 10*3/uL (ref 0.0–0.2)
Basos: 1 %
EOS (ABSOLUTE): 0.1 10*3/uL (ref 0.0–0.4)
Eos: 1 %
Hematocrit: 34.4 % (ref 34.0–46.6)
Hemoglobin: 11.7 g/dL (ref 11.1–15.9)
Immature Grans (Abs): 0 10*3/uL (ref 0.0–0.1)
Immature Granulocytes: 0 %
Lymphocytes Absolute: 2.6 10*3/uL (ref 0.7–3.1)
Lymphs: 35 %
MCH: 28.3 pg (ref 26.6–33.0)
MCHC: 34 g/dL (ref 31.5–35.7)
MCV: 83 fL (ref 79–97)
Monocytes Absolute: 0.3 10*3/uL (ref 0.1–0.9)
Monocytes: 5 %
Neutrophils Absolute: 4.4 10*3/uL (ref 1.4–7.0)
Neutrophils: 58 %
Platelets: 413 10*3/uL (ref 150–450)
RBC: 4.14 x10E6/uL (ref 3.77–5.28)
RDW: 13.7 % (ref 11.7–15.4)
WBC: 7.5 10*3/uL (ref 3.4–10.8)

## 2021-10-16 ENCOUNTER — Other Ambulatory Visit: Payer: Self-pay | Admitting: Family Medicine

## 2021-10-19 DIAGNOSIS — M79672 Pain in left foot: Secondary | ICD-10-CM | POA: Diagnosis not present

## 2021-10-19 DIAGNOSIS — M79674 Pain in right toe(s): Secondary | ICD-10-CM | POA: Diagnosis not present

## 2021-10-19 DIAGNOSIS — M79675 Pain in left toe(s): Secondary | ICD-10-CM | POA: Diagnosis not present

## 2021-10-19 DIAGNOSIS — I739 Peripheral vascular disease, unspecified: Secondary | ICD-10-CM | POA: Diagnosis not present

## 2021-10-19 DIAGNOSIS — M79671 Pain in right foot: Secondary | ICD-10-CM | POA: Diagnosis not present

## 2021-10-19 DIAGNOSIS — E1140 Type 2 diabetes mellitus with diabetic neuropathy, unspecified: Secondary | ICD-10-CM | POA: Diagnosis not present

## 2021-10-19 DIAGNOSIS — L110 Acquired keratosis follicularis: Secondary | ICD-10-CM | POA: Diagnosis not present

## 2021-10-24 ENCOUNTER — Ambulatory Visit: Payer: Medicare Other | Admitting: Family Medicine

## 2021-10-25 ENCOUNTER — Other Ambulatory Visit: Payer: Self-pay

## 2021-10-25 DIAGNOSIS — D509 Iron deficiency anemia, unspecified: Secondary | ICD-10-CM

## 2021-11-02 ENCOUNTER — Ambulatory Visit: Payer: Medicare Other | Admitting: Family Medicine

## 2021-11-15 ENCOUNTER — Ambulatory Visit: Payer: Medicare Other | Admitting: Family Medicine

## 2021-11-15 ENCOUNTER — Ambulatory Visit: Payer: Medicare Other

## 2021-11-17 ENCOUNTER — Ambulatory Visit (INDEPENDENT_AMBULATORY_CARE_PROVIDER_SITE_OTHER): Payer: Medicare Other | Admitting: Family Medicine

## 2021-11-17 ENCOUNTER — Encounter: Payer: Self-pay | Admitting: Family Medicine

## 2021-11-17 VITALS — BP 132/76 | HR 74 | Resp 16 | Ht 60.0 in | Wt 181.1 lb

## 2021-11-17 DIAGNOSIS — Z23 Encounter for immunization: Secondary | ICD-10-CM | POA: Diagnosis not present

## 2021-11-17 DIAGNOSIS — E1149 Type 2 diabetes mellitus with other diabetic neurological complication: Secondary | ICD-10-CM | POA: Diagnosis not present

## 2021-11-17 DIAGNOSIS — I10 Essential (primary) hypertension: Secondary | ICD-10-CM

## 2021-11-17 DIAGNOSIS — E559 Vitamin D deficiency, unspecified: Secondary | ICD-10-CM

## 2021-11-17 DIAGNOSIS — E785 Hyperlipidemia, unspecified: Secondary | ICD-10-CM

## 2021-11-17 NOTE — Patient Instructions (Addendum)
Please cancel 10/18/visit  Keep annual exam end October   Non fasting chem 7 and EGFR, HBA1C, CBC, TSH and vit D 5 days before end October visit, call if you need me sooner  Microalb today  Flu vaccine today  MUCH improved blood sugar keep checking range is 80 to 130  It is important that you exercise regularly at least 30 minutes 5 times a week. If you develop chest pain, have severe difficulty breathing, or feel very tired, stop exercising immediately and seek medical attention   Thanks for choosing [facility], we consider it a privelige to serve you.

## 2021-11-18 ENCOUNTER — Encounter: Payer: Self-pay | Admitting: Family Medicine

## 2021-11-18 NOTE — Assessment & Plan Note (Signed)
Hyperlipidemia:Low fat diet discussed and encouraged.   Lipid Panel  Lab Results  Component Value Date   CHOL 135 09/25/2021   HDL 63 09/25/2021   LDLCALC 60 09/25/2021   TRIG 56 09/25/2021   CHOLHDL 2.1 09/25/2021     Controlled, no change in medication

## 2021-11-18 NOTE — Assessment & Plan Note (Addendum)
[name] is reminded of the importance of commitment to daily physical activity for 30 minutes or more, as able and the need to limit carbohydrate intake to 30 to 60 grams per meal to help with blood sugar control.   The need to take medication as prescribed, test blood sugar as directed, and to call between visits if there is a concern that blood sugar is uncontrolled is also discussed.  Updated lab needed at/ before next visit. Seems to be improving based on [patient's log [name] is reminded of the importance of daily foot exam, annual eye examination, and good blood sugar, blood pressure and cholesterol control.     Latest Ref Rng & Units 09/28/2021   10:48 AM 09/25/2021   10:29 AM 06/26/2021    9:56 AM 04/07/2021   11:18 AM 03/06/2021    3:41 PM  Diabetic Labs  HbA1c 0.0 - 7.0 % 7.4   7.2  8.5    Chol 100 - 199 mg/dL  135   166    HDL >39 mg/dL  63   63    Calc LDL 0 - 99 mg/dL  60   91    Triglycerides 0 - 149 mg/dL  56   63    Creatinine 0.57 - 1.00 mg/dL  1.08  0.96  1.13  1.10       11/17/2021    9:22 AM 11/17/2021    9:15 AM 09/28/2021    9:50 AM 09/20/2021   11:37 AM 08/30/2021    2:30 PM 08/23/2021   10:14 AM 08/23/2021   10:08 AM  BP/Weight  Systolic BP 893 734 287 681  157 262  Diastolic BP 76 77 78 77  76 84  Wt. (Lbs)  181.12 181.12  179  179.12  BMI  35.37 kg/m2 35.37 kg/m2  34.96 kg/m2  34.98 kg/m2      Latest Ref Rng & Units 08/23/2021   10:00 AM 02/22/2021   12:00 AM  Foot/eye exam completion dates  Eye Exam No Retinopathy  No Retinopathy      Foot Form Completion  Done      This result is from an external source.

## 2021-11-18 NOTE — Assessment & Plan Note (Signed)
Controlled, no change in medication DASH diet and commitment to daily physical activity for a minimum of 30 minutes discussed and encouraged, as a part of hypertension management. The importance of attaining a healthy weight is also discussed.     11/17/2021    9:22 AM 11/17/2021    9:15 AM 09/28/2021    9:50 AM 09/20/2021   11:37 AM 08/30/2021    2:30 PM 08/23/2021   10:14 AM 08/23/2021   10:08 AM  BP/Weight  Systolic BP 060 045 997 741  423 953  Diastolic BP 76 77 78 77  76 84  Wt. (Lbs)  181.12 181.12  179  179.12  BMI  35.37 kg/m2 35.37 kg/m2  34.96 kg/m2  34.98 kg/m2

## 2021-11-18 NOTE — Progress Notes (Signed)
BREONIA KIRSTEIN     MRN: [id number]      DOB: 1955/10/13   HPI Ms. Elderkin is here for follow up and re-evaluation of chronic medical conditions, medication management and review of any available recent lab and radiology data.  Preventive health is updated, specifically  Cancer screening and Immunization.   Blood sugar metr reveals regular testing and much improved blood sugar readings averagingunder 130 Denies polyuria, polydipsia, blurred vision , or hypoglycemic episodes. ROS Denies recent fever or chills. Denies sinus pressure, nasal congestion, ear pain or sore throat. Denies chest congestion, productive cough or wheezing. Denies chest pains, palpitations and leg swelling Denies abdominal pain, nausea, vomiting,diarrhea or constipation.   Denies dysuria, frequency, hesitancy or incontinence. Chronic  joint pain, swelling and limitation in mobility. Denies headaches, seizures, numbness, or tingling. Denies depression, anxiety or insomnia. Denies skin break down or rash.   PE  BP 132/76 (BP Location: Left Arm, Cuff Size: Normal)   Pulse 74   Resp 16   Ht 5' (1.524 m)   Wt 181 lb 1.9 oz (82.2 kg)   LMP 08/18/2012   SpO2 96%   BMI 35.37 kg/m   Patient alert and oriented and in no cardiopulmonary distress.  HEENT: No facial asymmetry, EOMI,     Neck supple .  Chest: Clear to auscultation bilaterally.  CVS: S1, S2 no murmurs, no S3.Regular rate.  ABD: Soft non tender.   Ext: No edema  MS: Adequate ROM spine, shoulders, hips and reduced in knees.  Skin: Intact, no ulcerations or rash noted.  Psych: Good eye contact, normal affect. Memory intact not anxious or depressed appearing.  CNS: CN 2-12 intact, power,  normal throughout.no focal deficits noted.   Assessment & Plan  Hypertension goal BP (blood pressure) < 140/90 Controlled, no change in medication DASH diet and commitment to daily physical activity for a minimum of 30 minutes discussed and encouraged, as a  part of hypertension management. The importance of attaining a healthy weight is also discussed.     11/17/2021    9:22 AM 11/17/2021    9:15 AM 09/28/2021    9:50 AM 09/20/2021   11:37 AM 08/30/2021    2:30 PM 08/23/2021   10:14 AM 08/23/2021   10:08 AM  BP/Weight  Systolic BP 962 952 841 324  401 027  Diastolic BP 76 77 78 77  76 84  Wt. (Lbs)  181.12 181.12  179  179.12  BMI  35.37 kg/m2 35.37 kg/m2  34.96 kg/m2  34.98 kg/m2       Morbid obesity (HCC)  Patient re-educated about  the importance of commitment to a  minimum of 150 minutes of exercise per week as able.  The importance of healthy food choices with portion control discussed, as well as eating regularly and within a 12 hour window most days. The need to choose "clean , green" food 50 to 75% of the time is discussed, as well as to make water the primary drink and set a goal of 64 ounces water daily.       11/17/2021    9:15 AM 09/28/2021    9:50 AM 08/30/2021    2:30 PM  Weight /BMI  Weight 181 lb 1.9 oz 181 lb 1.9 oz 179 lb  Height 5' (1.524 m) 5' (1.524 m) 5' (1.524 m)  BMI 35.37 kg/m2 35.37 kg/m2 34.96 kg/m2      Type 2 diabetes mellitus with neurological complications [facility]) Ms. Berni is reminded of  the importance of commitment to daily physical activity for 30 minutes or more, as able and the need to limit carbohydrate intake to 30 to 60 grams per meal to help with blood sugar control.   The need to take medication as prescribed, test blood sugar as directed, and to call between visits if there is a concern that blood sugar is uncontrolled is also discussed.  Updated lab needed at/ before next visit. Seems to be improving based on [patient's log Ms. Sabree is reminded of the importance of daily foot exam, annual eye examination, and good blood sugar, blood pressure and cholesterol control.     Latest Ref Rng & Units 09/28/2021   10:48 AM 09/25/2021   10:29 AM 06/26/2021    9:56 AM 04/07/2021   11:18 AM 03/06/2021     3:41 PM  Diabetic Labs  HbA1c 0.0 - 7.0 % 7.4   7.2  8.5    Chol 100 - 199 mg/dL  135   166    HDL >39 mg/dL  63   63    Calc LDL 0 - 99 mg/dL  60   91    Triglycerides 0 - 149 mg/dL  56   63    Creatinine 0.57 - 1.00 mg/dL  1.08  0.96  1.13  1.10       11/17/2021    9:22 AM 11/17/2021    9:15 AM 09/28/2021    9:50 AM 09/20/2021   11:37 AM 08/30/2021    2:30 PM 08/23/2021   10:14 AM 08/23/2021   10:08 AM  BP/Weight  Systolic BP 614 431 540 086  761 950  Diastolic BP 76 77 78 77  76 84  Wt. (Lbs)  181.12 181.12  179  179.12  BMI  35.37 kg/m2 35.37 kg/m2  34.96 kg/m2  34.98 kg/m2      Latest Ref Rng & Units 08/23/2021   10:00 AM 02/22/2021   12:00 AM  Foot/eye exam completion dates  Eye Exam No Retinopathy  No Retinopathy      Foot Form Completion  Done      This result is from an external source.        Hyperlipidemia LDL goal <100 Hyperlipidemia:Low fat diet discussed and encouraged.   Lipid Panel  Lab Results  Component Value Date   CHOL 135 09/25/2021   HDL 63 09/25/2021   LDLCALC 60 09/25/2021   TRIG 56 09/25/2021   CHOLHDL 2.1 09/25/2021     Controlled, no change in medication

## 2021-11-18 NOTE — Assessment & Plan Note (Signed)
  Patient re-educated about  the importance of commitment to a  minimum of 150 minutes of exercise per week as able.  The importance of healthy food choices with portion control discussed, as well as eating regularly and within a 12 hour window most days. The need to choose "clean , green" food 50 to 75% of the time is discussed, as well as to make water the primary drink and set a goal of 64 ounces water daily.       11/17/2021    9:15 AM 09/28/2021    9:50 AM 08/30/2021    2:30 PM  Weight /BMI  Weight 181 lb 1.9 oz 181 lb 1.9 oz 179 lb  Height 5' (1.524 m) 5' (1.524 m) 5' (1.524 m)  BMI 35.37 kg/m2 35.37 kg/m2 34.96 kg/m2

## 2021-11-19 LAB — MICROALBUMIN / CREATININE URINE RATIO
Creatinine, Urine: 82.5 mg/dL
Microalb/Creat Ratio: 40 mg/g creat — ABNORMAL HIGH (ref 0–29)
Microalbumin, Urine: 32.7 ug/mL

## 2021-11-24 ENCOUNTER — Other Ambulatory Visit: Payer: Self-pay | Admitting: Family Medicine

## 2021-11-27 ENCOUNTER — Encounter: Payer: Self-pay | Admitting: *Deleted

## 2021-12-21 DIAGNOSIS — L110 Acquired keratosis follicularis: Secondary | ICD-10-CM | POA: Diagnosis not present

## 2021-12-21 DIAGNOSIS — I739 Peripheral vascular disease, unspecified: Secondary | ICD-10-CM | POA: Diagnosis not present

## 2021-12-21 DIAGNOSIS — M79672 Pain in left foot: Secondary | ICD-10-CM | POA: Diagnosis not present

## 2021-12-21 DIAGNOSIS — E1140 Type 2 diabetes mellitus with diabetic neuropathy, unspecified: Secondary | ICD-10-CM | POA: Diagnosis not present

## 2021-12-21 DIAGNOSIS — M79671 Pain in right foot: Secondary | ICD-10-CM | POA: Diagnosis not present

## 2021-12-21 DIAGNOSIS — M79675 Pain in left toe(s): Secondary | ICD-10-CM | POA: Diagnosis not present

## 2021-12-21 DIAGNOSIS — M79674 Pain in right toe(s): Secondary | ICD-10-CM | POA: Diagnosis not present

## 2021-12-25 ENCOUNTER — Ambulatory Visit (INDEPENDENT_AMBULATORY_CARE_PROVIDER_SITE_OTHER): Payer: Medicare Other

## 2021-12-25 DIAGNOSIS — Z0000 Encounter for general adult medical examination without abnormal findings: Secondary | ICD-10-CM | POA: Diagnosis not present

## 2021-12-25 NOTE — Patient Instructions (Signed)
  Michaela Peterson , Thank you for taking time to come for your Medicare Wellness Visit. I appreciate your ongoing commitment to your health goals. Please review the following plan we discussed and let me know if I can assist you in the future.   These are the goals we discussed:  Goals      Exercise 3x per week (30 min per time)     Patient Stated     Have knee surgery     Weight (lb) < 200 lb (90.7 kg)     Wants to lose 30 lbs.         This is a list of the screening recommended for you and due dates:  Health Maintenance  Topic Date Due   COVID-19 Vaccine (6 - Moderna risk series) 04/19/2021   Zoster (Shingles) Vaccine (2 of 2) 12/14/2021   Eye exam for diabetics  02/22/2022   Hemoglobin A1C  03/31/2022   Complete foot exam   08/24/2022   Yearly kidney function blood test for diabetes  09/26/2022   Yearly kidney health urinalysis for diabetes  11/18/2022   Mammogram  10/14/2023   Tetanus Vaccine  12/24/2023   Colon Cancer Screening  11/11/2028   Pneumonia Vaccine  Completed   Flu Shot  Completed   DEXA scan (bone density measurement)  Completed   Hepatitis C Screening: USPSTF Recommendation to screen - Ages 15-79 yo.  Completed   HPV Vaccine  Aged Out

## 2021-12-25 NOTE — Progress Notes (Signed)
Subjective:   Michaela Peterson is a 66 y.o. female who presents for Medicare Annual (Subsequent) preventive examination.  Review of Systems    I connected with  Michaela Peterson on 12/25/21 by a audio enabled telemedicine application and verified that I am speaking with the correct person using two identifiers.  Patient Location: Home  Provider Location: Office/Clinic  I discussed the limitations of evaluation and management by telemedicine. The patient expressed understanding and agreed to proceed.        Objective:    There were no vitals filed for this visit. There is no height or weight on file to calculate BMI.     03/07/2021   10:41 AM 12/21/2020    9:11 AM 12/25/2019    9:30 AM 12/21/2019    8:39 AM 05/06/2019   11:31 AM 11/12/2018    9:26 AM 11/01/2017   10:29 AM  Advanced Directives  Does Patient Have a Medical Advance Directive? _0  No No  Would patient like information on creating a medical advance directive? No - Patient declined No - Patient declined No - Patient declined No - Patient declined Yes (MAU/Ambulatory/Procedural Areas - Information given) No - Patient declined Yes (ED - Information included in AVS)    Current Medications (verified) Outpatient Encounter Medications as of 12/25/2021  Medication Sig   aspirin EC 81 MG tablet Take 81 mg by mouth daily.   azelastine (ASTELIN) 0.1 % nasal spray Place 2 sprays into both nostrils 2 (two) times daily. Use in each nostril as directed   Blood Glucose Monitoring Suppl (ONE TOUCH ULTRA 2) w/Device KIT USE TO TEST ONCE DAILY   Cholecalciferol (VITAMIN D3) 50 MCG (2000 UT) TABS Take 2,000 Units by mouth daily.   fluorometholone (FML) 0.1 % ophthalmic suspension Place 1 drop into both eyes 3 (three) times daily.   fluticasone (FLONASE) 50 MCG/ACT nasal spray USE 2 SPRAYS IN BOTH  NOSTRILS DAILY   glipiZIDE (GLUCOTROL) 10 MG tablet TAKE 1 TABLET BY MOUTH  TWICE DAILY BEFORE MEALS   glucose blood (ONETOUCH  ULTRA) test strip USE TO TEST ONCE DAILY   Lancets (ONETOUCH DELICA PLUS OLIDCV01T) MISC Test once daily DX e11.9   metFORMIN (GLUCOPHAGE) 1000 MG tablet TAKE 1 TABLET BY MOUTH  TWICE DAILY WITH MEALS   montelukast (SINGULAIR) 10 MG tablet TAKE 1 TABLET BY MOUTH AT  BEDTIME   NIFEdipine (ADALAT CC) 90 MG 24 hr tablet TAKE 1 TABLET BY MOUTH  DAILY   olmesartan (BENICAR) 40 MG tablet TAKE 1 TABLET BY MOUTH EVERY DAY   pantoprazole (PROTONIX) 40 MG tablet TAKE 1 TABLET BY MOUTH  DAILY BEFORE BREAKFAST   rosuvastatin (CRESTOR) 5 MG tablet TAKE ONE TABLET BY MOUTH EVERY MONDAY AND FRIDAY   spironolactone (ALDACTONE) 50 MG tablet TAKE 1 AND 1/2 TABLETS BY MOUTH  ONCE DAILY   triamcinolone cream (KENALOG) 0.1 % Apply 1 application topically daily as needed (rash).   No facility-administered encounter medications on file as of 12/25/2021.    Allergies (verified) Gabapentin, Ace inhibitors, Aspirin, Oxycodone, Penicillins, Percocet [oxycodone-acetaminophen], Rebif [interferon beta-1a], and Statins   History: Past Medical History:  Diagnosis Date   Adnexal mass    Left; followed by Dr. [name]   Anemia, iron deficiency    Arthritis    Chronic low back pain    Diabetes mellitus, type 2 ([place])    Essential hypertension    Gastroparesis    GERD (gastroesophageal reflux disease)  MS (multiple sclerosis) ([place])    Nonalcoholic fatty liver disease    Obesity    Sleep apnea    Past Surgical History:  Procedure Laterality Date   AGILE CAPSULE N/A 08/11/2014   Procedure: AGILE CAPSULE;  Surgeon: Daneil Dolin, MD;  [facility];  Service: Endoscopy;  Laterality: N/A;  0700   BALLOON DILATION N/A 03/07/2021   Procedure: BALLOON DILATION;  Surgeon: [name], DO;  [facility]: [facility];  Service: Endoscopy;  Laterality: N/A;   BIOPSY  03/07/2021   Procedure: BIOPSY;  Surgeon: [name], DO;  [facility];  Service: Endoscopy;;   BLADDER SUSPENSION      CESAREAN SECTION     x2    CHOLECYSTECTOMY     COLONOSCOPY  2010   Dr. [name]: tubular adenoma, few scattered diverticula   COLONOSCOPY N/A 10/01/2013   Dr. Pixie Casino preparation. Normal rectum. Normal colonic mucosa   COLONOSCOPY N/A 11/12/2018   sigmoid and descending colon diverticulosis. Two 4-5 mm polyps in ascending colon. (tubular adenomas). Surveillance in 5 years   DILATION AND CURETTAGE OF UTERUS     4   ESOPHAGOGASTRODUODENOSCOPY  2010   Dr. [name]: normal esophagus, small hiatal hernia, questionable pale duodenal mucosa but negative for celiac sprue.    ESOPHAGOGASTRODUODENOSCOPY N/A 10/01/2013   Dr. Rourk:gastric polyps-status post biopsy. Otherwise, normal EGD. fundic gland polyp, negative H.pylori   ESOPHAGOGASTRODUODENOSCOPY (EGD) WITH PROPOFOL N/A 03/07/2021   Procedure: ESOPHAGOGASTRODUODENOSCOPY (EGD) WITH PROPOFOL;  Surgeon: [name], DO;  [facility];  Service: Endoscopy;  Laterality: N/A;  12:00pm   GIVENS CAPSULE STUDY N/A 09/08/2014   Poor prep but overall unremarkable    KNEE ARTHROSCOPY W/ DEBRIDEMENT     LAPAROSCOPY ABDOMEN DIAGNOSTIC     lysis of adhesions   POLYPECTOMY  11/12/2018   Procedure: POLYPECTOMY;  Surgeon: Daneil Dolin, MD;  [facility];  Service: Endoscopy;;   ROTATOR CUFF REPAIR     TENDON LENGTHENING     Left wrist   UMBILICAL HERNIA REPAIR     Family History  Problem Relation Age of Onset   Heart failure Mother    Hypertension Mother    Arthritis Mother    Stroke Father    Hypertension Sister    Diabetes Sister    Heart disease Sister    Kidney failure Sister    Hypertension Sister    Hypertension Brother    Mental illness Brother    Hypertension Son    Colon cancer Maternal Aunt    Social History   Socioeconomic History   Marital status: Married    Spouse name: [name]    Number of children: 4   Years of education: 13   Highest education level: Not on file  Occupational History   Occupation:  unemployed     Fish farm manager: UNEMPLOYED  Tobacco Use   Smoking status: Never   Smokeless tobacco: Never   Tobacco comments:    patient lives with a smoker   Vaping Use   Vaping Use: Never used  Substance and Sexual Activity   Alcohol use: No   Drug use: No   Sexual activity: Yes    Birth control/protection: Post-menopausal  Other Topics Concern   Not on file  Social History Narrative   Patient lives at home with husband [name].    Patient has 4 children.    Patient has a some college.    Patient is right handed.  Patient is currently not working.    Social Determinants of Health   Financial Resource Strain: Low Risk  (12/21/2020)   Overall Financial Resource Strain (CARDIA)    Difficulty of Paying Living Expenses: Not hard at all  Food Insecurity: No Food Insecurity (12/21/2020)   Hunger Vital Sign    Worried About Running Out of Food in the Last Year: Never true    Ran Out of Food in the Last Year: Never true  Transportation Needs: No Transportation Needs (12/21/2020)   PRAPARE - Hydrologist (Medical): No    Lack of Transportation (Non-Medical): No  Physical Activity: Insufficiently Active (12/21/2020)   Exercise Vital Sign    Days of Exercise per Week: 2 days    Minutes of Exercise per Session: 20 min  Stress: No Stress Concern Present (12/21/2020)   [place]    Feeling of Stress : Only a little  Social Connections: Socially Integrated (12/21/2020)   Social Connection and Isolation Panel [NHANES]    Frequency of Communication with Friends and Family: More than three times a week    Frequency of Social Gatherings with Friends and Family: More than three times a week    Attends Religious Services: More than 4 times per year    Active Member of [employer] or Organizations: Yes    Attends Music therapist: More than 4 times per year    Marital Status: Married     Tobacco Counseling Counseling given: Not Answered Tobacco comments: patient lives with a smoker    Clinical Intake:                 Diabetic?yes Nutrition Risk Assessment:  Has the patient had any N/V/D within the last 2 months?  No  Does the patient have any non-healing wounds?  No  Has the patient had any unintentional weight loss or weight gain?  No   Diabetes:  Is the patient diabetic?  Yes  If diabetic, was a CBG obtained today?  No  Did the patient bring in their glucometer from home?  No  How often do you monitor your CBG's? 5 times a week.   Financial Strains and Diabetes Management:  Are you having any financial strains with the device, your supplies or your medication? No .  Does the patient want to be seen by Chronic Care Management for management of their diabetes?  No  Would the patient like to be referred to a Nutritionist or for Diabetic Management?  No   Diabetic Exams:  Diabetic Eye Exam: Completed 02/22/21 Diabetic Foot Exam: Completed 08/23/21           Activities of Daily Living     No data to display           Patient Care Team: [name], MD as PCP - General Domenic Polite Aloha Gell, MD as PCP - Cardiology (Cardiology) [name] Cristopher Estimable, MD as Consulting Physician (Gastroenterology) Dennie Bible, NP as Nurse Practitioner (Family Medicine) Elsie Saas, MD as Consulting Physician (Orthopedic Surgery)  Indicate any recent Medical Services you may have received from other than [facility] providers in the past year (date may be approximate).     Assessment:   This is a routine wellness examination for Michaela Peterson.  Hearing/Vision screen No results found.  Dietary issues and exercise activities discussed:     Goals Addressed   None   Depression Screen    11/17/2021  9:21 AM 09/28/2021    9:51 AM 08/23/2021   10:09 AM 07/13/2021   10:17 AM 06/28/2021   10:42 AM 05/04/2021   10:07 AM 03/24/2021    1:28 PM  PHQ 2/9  Scores  PHQ - 2 Score 0 0 0 0 0 1 0    Fall Risk    11/17/2021    9:21 AM 09/28/2021    9:51 AM 08/23/2021   10:09 AM 07/13/2021   10:17 AM 06/28/2021   10:42 AM  Fall Risk   Falls in the past year? 0 0 0 0 0  Number falls in past yr: 0 0 0 0 0  Injury with Fall? 0 0 0 0 0  Risk for fall due to : _0   Follow up _1     FALL RISK PREVENTION PERTAINING TO THE HOME:  Any stairs in or around the home? Yes  If so, are there any without handrails? No  Home free of loose throw rugs in walkways, pet beds, electrical cords, etc? Yes  Adequate lighting in your home to reduce risk of falls? Yes   ASSISTIVE DEVICES UTILIZED TO PREVENT FALLS:  Life alert? No  Use of a cane, walker or w/c? Yes  Grab bars in the bathroom? Yes  Shower chair or bench in shower? Yes  Elevated toilet seat or a handicapped toilet? Yes     Cognitive Function:        12/21/2020    9:14 AM 12/21/2019    8:43 AM 11/05/2018    9:56 AM 11/01/2017   10:38 AM  6CIT Screen  What Year? 0 points 0 points 0 points 0 points  What month? 0 points 0 points 0 points 0 points  What time? 0 points 0 points 0 points 0 points  Count back from 20 0 points 0 points 0 points 0 points  Months in reverse 0 points 0 points 0 points 0 points  Repeat phrase 0 points 0 points 0 points 0 points  Total Score 0 points 0 points 0 points 0 points    Immunizations Immunization History  Administered Date(s) Administered   Fluad Quad(high Dose 65+) 11/17/2021   Influenza Split 01/17/2011, 01/09/2012   Influenza Whole 12/11/2006, 12/29/2008, 11/28/2009   Influenza,inj,Quad PF,6+ Mos 01/08/2013, 12/23/2013, 01/18/2015, 12/14/2015, 12/19/2016, 12/20/2017, 12/17/2018, 11/03/2019, 11/08/2020   Moderna SARS-COV2 Booster Vaccination 02/22/2021    Moderna Sars-Covid-2 Vaccination 05/20/2019, 06/17/2019, 01/26/2020, 07/28/2020, 07/28/2020   PNEUMOCOCCAL CONJUGATE-20 01/03/2021   Pneumococcal Conjugate-13 10/19/2013   Pneumococcal Polysaccharide-23 11/30/2003, 08/30/2010   Td 11/30/2003   Tdap 12/23/2013   Zoster Recombinat (Shingrix) 10/19/2021   Zoster, Live 02/29/2016    TDAP status: Up to date  Flu Vaccine status: Up to date  Pneumococcal vaccine status: Up to date  Covid-19 vaccine status: Completed vaccines  Qualifies for Shingles Vaccine? Yes   Zostavax completed Yes   Shingrix Completed?: Yes  Screening Tests Health Maintenance  Topic Date Due   COVID-19 Vaccine (6 - Moderna risk series) 04/19/2021   Zoster Vaccines- Shingrix (2 of 2) 12/14/2021   OPHTHALMOLOGY EXAM  02/22/2022   HEMOGLOBIN A1C  03/31/2022   FOOT EXAM  08/24/2022   Diabetic kidney evaluation - GFR measurement  09/26/2022   Diabetic kidney evaluation - Urine ACR  11/18/2022   MAMMOGRAM  10/14/2023   TETANUS/TDAP  12/24/2023   COLONOSCOPY (  Pts 45-64yr Insurance coverage will need to be confirmed)  11/11/2028   Pneumonia Vaccine 66 Years old  Completed   INFLUENZA VACCINE  Completed   DEXA SCAN  Completed   Hepatitis C Screening  Completed   HPV VACCINES  Aged Out    Health Maintenance  Health Maintenance Due  Topic Date Due   COVID-19 Vaccine (6 - Moderna risk series) 04/19/2021   Zoster Vaccines- Shingrix (2 of 2) 12/14/2021    Colorectal cancer screening: Type of screening: Colonoscopy. Completed 11/12/2018. Repeat every 10 years  Mammogram status: Completed 10/13/21. Repeat every year  Bone Density status: Completed 01/06/21. Results reflect: Bone density results: OSTEOPOROSIS. Repeat every 2 years.  Lung Cancer Screening: (Low Dose CT Chest recommended if Age 66-80years, 30 pack-year currently smoking OR have quit w/in 15years.) does not qualify.   Lung Cancer Screening Referral: no  Additional Screening:  Hepatitis C  Screening: does qualify; Completed 07/13/21  Vision Screening: Recommended annual ophthalmology exams for early detection of glaucoma and other disorders of the eye. Is the patient up to date with their annual eye exam?  Yes  Who is the provider or what is the name of the office in which the patient attends annual eye exams? N/a If pt is not established with a provider, would they like to be referred to a provider to establish care? No .   Dental Screening: Recommended annual dental exams for proper oral hygiene  Community Resource Referral / Chronic Care Management: CRR required this visit?  No   CCM required this visit?  No      Plan:     I have personally reviewed and noted the following in the patient's chart:   Medical and social history Use of alcohol, tobacco or illicit drugs  Current medications and supplements including opioid prescriptions. Patient is not currently taking opioid prescriptions. Functional ability and status Nutritional status Physical activity Advanced directives List of other physicians Hospitalizations, surgeries, and ER visits in previous 12 months Vitals Screenings to include cognitive, depression, and falls Referrals and appointments  In addition, I have reviewed and discussed with patient certain preventive protocols, quality metrics, and best practice recommendations. A written personalized care plan for preventive services as well as general preventive health recommendations were provided to patient.     [place]  12/25/2021

## 2021-12-27 ENCOUNTER — Ambulatory Visit: Payer: Medicare Other | Admitting: Family Medicine

## 2022-01-02 DIAGNOSIS — E559 Vitamin D deficiency, unspecified: Secondary | ICD-10-CM | POA: Diagnosis not present

## 2022-01-02 DIAGNOSIS — I10 Essential (primary) hypertension: Secondary | ICD-10-CM | POA: Diagnosis not present

## 2022-01-02 DIAGNOSIS — E1149 Type 2 diabetes mellitus with other diabetic neurological complication: Secondary | ICD-10-CM | POA: Diagnosis not present

## 2022-01-03 LAB — CBC
Hematocrit: 32.3 % — ABNORMAL LOW (ref 34.0–46.6)
Hemoglobin: 11.1 g/dL (ref 11.1–15.9)
MCH: 29 pg (ref 26.6–33.0)
MCHC: 34.4 g/dL (ref 31.5–35.7)
MCV: 84 fL (ref 79–97)
Platelets: 411 10*3/uL (ref 150–450)
RBC: 3.83 x10E6/uL (ref 3.77–5.28)
RDW: 13.8 % (ref 11.7–15.4)
WBC: 9.3 10*3/uL (ref 3.4–10.8)

## 2022-01-03 LAB — BASIC METABOLIC PANEL
BUN/Creatinine Ratio: 17 (ref 12–28)
BUN: 16 mg/dL (ref 8–27)
CO2: 19 mmol/L — ABNORMAL LOW (ref 20–29)
Calcium: 9.7 mg/dL (ref 8.7–10.3)
Chloride: 108 mmol/L — ABNORMAL HIGH (ref 96–106)
Creatinine, Ser: 0.96 mg/dL (ref 0.57–1.00)
Glucose: 78 mg/dL (ref 70–99)
Potassium: 4.6 mmol/L (ref 3.5–5.2)
Sodium: 143 mmol/L (ref 134–144)
eGFR: 65 mL/min/{1.73_m2} (ref >59)

## 2022-01-03 LAB — HEMOGLOBIN A1C
Est. average glucose Bld gHb Est-mCnc: 160 mg/dL
Hgb A1c MFr Bld: 7.2 % — ABNORMAL HIGH (ref 4.8–5.6)

## 2022-01-03 LAB — VITAMIN D 25 HYDROXY (VIT D DEFICIENCY, FRACTURES): Vit D, 25-Hydroxy: 34.2 ng/mL (ref 30.0–100.0)

## 2022-01-03 LAB — TSH: TSH: 2.79 u[IU]/mL (ref 0.450–4.500)

## 2022-01-04 ENCOUNTER — Ambulatory Visit (INDEPENDENT_AMBULATORY_CARE_PROVIDER_SITE_OTHER): Payer: Medicare Other | Admitting: Family Medicine

## 2022-01-04 ENCOUNTER — Encounter: Payer: Self-pay | Admitting: Family Medicine

## 2022-01-04 VITALS — BP 136/74 | HR 79 | Ht 60.0 in | Wt 184.1 lb

## 2022-01-04 DIAGNOSIS — I10 Essential (primary) hypertension: Secondary | ICD-10-CM | POA: Diagnosis not present

## 2022-01-04 DIAGNOSIS — Z0001 Encounter for general adult medical examination with abnormal findings: Secondary | ICD-10-CM

## 2022-01-04 DIAGNOSIS — E1149 Type 2 diabetes mellitus with other diabetic neurological complication: Secondary | ICD-10-CM

## 2022-01-04 DIAGNOSIS — E785 Hyperlipidemia, unspecified: Secondary | ICD-10-CM

## 2022-01-04 NOTE — Patient Instructions (Signed)
Follow-up second week in February call if you need me sooner.  No changes in medication at this time.  Work on reducing sweets and white starchy foods so that your blood sugar  average gets to goal of less than 7.0.  Fasting lipid CMP and EGFR HbA1c 3 days before next visit.  Nurse please add magnesium level to recent labs drawn.  Please do get your COVID vaccines as soon as possible.  Please do get your second shingles vaccine before the end of the year.  Please call once you make a decision on orthopedic referral regarding right knee which is unstable and needs surgery.   Thanks for choosing [facility], we consider it a privelige to serve you. BEST for Season and 2024! 

## 2022-01-06 LAB — SPECIMEN STATUS REPORT

## 2022-01-06 LAB — MAGNESIUM: Magnesium: 1.5 mg/dL — ABNORMAL LOW (ref 1.6–2.3)

## 2022-01-07 ENCOUNTER — Encounter: Payer: Self-pay | Admitting: Family Medicine

## 2022-01-07 DIAGNOSIS — Z0001 Encounter for general adult medical examination with abnormal findings: Secondary | ICD-10-CM | POA: Insufficient documentation

## 2022-01-07 NOTE — Progress Notes (Signed)
    Michaela Peterson     MRN: [id number]      DOB: 1955-12-31  HPI: Patient is in for annual physical exam.  Recent labs,  are reviewed. Immunization is reviewed , and  needs to be updated.   PE: BP 136/74   Pulse 79   Ht 5' (1.524 m)   Wt 184 lb 1.3 oz (83.5 kg)   LMP 08/18/2012   SpO2 95%   BMI 35.95 kg/m   Pleasant  female, alert and oriented x 3, in no cardio-pulmonary distress. Afebrile. HEENT No facial trauma or asymetry. Sinuses non tender.  Extra occullar muscles intact.. External ears normal, . Neck: supple, no adenopathy,JVD or thyromegaly.No bruits.  Chest: Clear to ascultation bilaterally.No crackles or wheezes. Non tender to palpation    Cardiovascular system; Heart sounds normal,  S1 and  S2 ,no S3.  No murmur, or thrill. Apical beat not displaced Peripheral pulses normal.  Abdomen: Soft, non tender,  Musculoskeletal exam: Full ROM of spine, hips , shoulders and markedly reduced in right knee. deformity ,swelling and  crepitus noted.in right knee No muscle wasting or atrophy.   Neurologic: Cranial nerves 2 to 12 intact. Power, tone ,sensation and reflexes normal throughout. disturbance in gait. No tremor.  Skin: Intact, no ulceration, erythema , scaling or rash noted. Pigmentation normal throughout  Psych; Normal mood and affect. Judgement and concentration normal   Assessment & Plan:  Annual visit for general adult medical examination with abnormal findings Annual exam as documented. Counseling done  re healthy lifestyle involving commitment to 150 minutes exercise per week, heart healthy diet, and attaining healthy weight.The importance of adequate sleep also discussed. Regular seat belt use and home safety, is also discussed. Changes in health habits are decided on by the patient with goals and time frames  set for achieving them. Immunization and cancer screening needs are specifically addressed at this visit.

## 2022-01-07 NOTE — Assessment & Plan Note (Signed)

## 2022-02-06 ENCOUNTER — Ambulatory Visit: Payer: Medicare Other | Admitting: Internal Medicine

## 2022-02-06 ENCOUNTER — Encounter: Payer: Self-pay | Admitting: Internal Medicine

## 2022-02-06 VITALS — BP 183/80 | HR 41 | Temp 97.6°F | Ht 60.0 in | Wt 185.8 lb

## 2022-02-06 DIAGNOSIS — K3184 Gastroparesis: Secondary | ICD-10-CM

## 2022-02-06 DIAGNOSIS — R194 Change in bowel habit: Secondary | ICD-10-CM | POA: Diagnosis not present

## 2022-02-06 MED ORDER — CHOLESTYRAMINE 4 G PO PACK: 4.0000 g | PACK | Freq: Every day | ORAL | 1 refills | Status: DC

## 2022-02-06 NOTE — Addendum Note (Signed)
Addended by: Orland Jarred on: 02/06/2022 10:34 AM   Modules accepted: Orders

## 2022-02-06 NOTE — Patient Instructions (Signed)
It was good to see you again today!  As discussed, continue a gastroparesis diet-5-6 small meals daily  Continue Protonix 40 mg daily before meal  For your loose stools, lets try Questran or cholestyramine powder 1 packet (4 g) and water daily.  Important not to take it within 2 hours before after taking any of your other medications.  This medication is primarily used for high triglycerides but works very well for the type of bowel symptoms you are experiencing.  The major side effect would be constipation.  The cause of your loose stools could be the fact that your gallbladder is gone.  Potential medication side effects remain a possibility.  High blood sugars with diabetes can affect bowel function as well.  Hopefully, we will get your bowel symptoms under control without having to eliminate medications and move up timing of colonoscopy which is currently scheduled for 2025  We will see you back in the office in 3 months  If Questran or cholestyramine is not working for you (you will know in the next 2 to 3 weeks), please call me.  Doses medication can be adjusted.  Get back on your blood pressure medication as recommended.  Your blood pressure will be rechecked.  Merry Christmas!

## 2022-02-06 NOTE — Progress Notes (Signed)
Primary Care Physician:  [name], MD Primary Gastroenterologist:  Dr. [name]-----------------------------------------------------------------------------------------------  Pre-Procedure History & Physical: HPI:  Michaela Peterson is a 66 y.o. female here for further evaluation of loose bowels.  Patient reports that she has noted tendency towards loose postprandial bowel function going back to 2018 has gotten much worse this year.  Try to go out and eat in a restaurant afraid to travel.  Occasional accidents, not being able to make it to the bathroom in time.  There are days where she has no bowel movements.  She is not passing any blood.  Extensive evaluation of iron deficiency anemia negative.  Reflux is well-controlled on Protonix 40 mg daily likewise, gastroparesis is well-controlled on a gastroparesis type diet.  She is not on Reglan.  Of note, she has been on Glucophage for good 20 years.  Also, Benicar added to her regimen earlier this year.    Atrial colonic adenomas; due for surveillance colonoscopy 2025.  Incidentally, blood pressure today markedly elevated.  Patient states she put her meds up high in a cabinet because her grandchildren were visiting for Thanksgiving and she was forgetting to take it over the holiday.  Past Medical History:  Diagnosis Date   Adnexal mass    Left; followed by Dr. [name]   Anemia, iron deficiency    Arthritis    Chronic low back pain    Diabetes mellitus, type 2 (HCC)    Essential hypertension    Gastroparesis    GERD (gastroesophageal reflux disease)    MS (multiple sclerosis) ([place])    Nonalcoholic fatty liver disease    Obesity    Sleep apnea     Past Surgical History:  Procedure Laterality Date   AGILE CAPSULE N/A 08/11/2014   Procedure: AGILE CAPSULE;  Surgeon: Daneil Dolin, MD;  [facility];  Service: Endoscopy;  Laterality: N/A;  0700   BALLOON DILATION N/A 03/07/2021   Procedure: BALLOON DILATION;  Surgeon:  [name], DO;  [facility];  Service: Endoscopy;  Laterality: N/A;   BIOPSY  03/07/2021   Procedure: BIOPSY;  Surgeon: [name], DO;  [facility];  Service: Endoscopy;;   BLADDER SUSPENSION     CESAREAN SECTION     x2    CHOLECYSTECTOMY     COLONOSCOPY  2010   Dr. [name]: tubular adenoma, few scattered diverticula   COLONOSCOPY N/A 10/01/2013   Dr. Pixie Casino preparation. Normal rectum. Normal colonic mucosa   COLONOSCOPY N/A 11/12/2018   sigmoid and descending colon diverticulosis. Two 4-5 mm polyps in ascending colon. (tubular adenomas). Surveillance in 5 years   DILATION AND CURETTAGE OF UTERUS     4   ESOPHAGOGASTRODUODENOSCOPY  2010   Dr. [name]: normal esophagus, small hiatal hernia, questionable pale duodenal mucosa but negative for celiac sprue.    ESOPHAGOGASTRODUODENOSCOPY N/A 10/01/2013   Dr. Amyrie Illingworth:gastric polyps-status post biopsy. Otherwise, normal EGD. fundic gland polyp, negative H.pylori   ESOPHAGOGASTRODUODENOSCOPY (EGD) WITH PROPOFOL N/A 03/07/2021   Procedure: ESOPHAGOGASTRODUODENOSCOPY (EGD) WITH PROPOFOL;  Surgeon: [name], DO;  [facility];  Service: Endoscopy;  Laterality: N/A;  12:00pm   GIVENS CAPSULE STUDY N/A 09/08/2014   Poor prep but overall unremarkable    KNEE ARTHROSCOPY W/ DEBRIDEMENT     LAPAROSCOPY ABDOMEN DIAGNOSTIC     lysis of adhesions   POLYPECTOMY  11/12/2018   Procedure: POLYPECTOMY;  Surgeon: Daneil Dolin, MD;  [facility];  Service: Endoscopy;;   ROTATOR CUFF REPAIR     TENDON LENGTHENING     Left wrist   UMBILICAL HERNIA REPAIR      Prior to Admission medications   Medication Sig Start Date End Date Taking? Authorizing Provider  aspirin EC 81 MG tablet Take 81 mg by mouth daily.   Yes [provider]  azelastine (ASTELIN) 0.1 % nasal spray Place 2 sprays into both nostrils 2 (two) times daily. Use in each nostril as directed 08/31/20  Yes [name], MD  Blood Glucose Monitoring Suppl (ONE TOUCH ULTRA 2) w/Device KIT USE TO TEST ONCE DAILY 07/19/20  Yes [name], MD  cetirizine (ZYRTEC) 10 MG tablet Take 1 tablet (10 mg total) by mouth daily. 01/04/22  Yes [name], MD  Cholecalciferol (VITAMIN D3) 50 MCG (2000 UT) TABS Take 2,000 Units by mouth daily.   Yes [provider]  fluorometholone (FML) 0.1 % ophthalmic suspension Place 1 drop into both eyes 3 (three) times daily. 02/24/21  Yes [provider]  fluticasone (FLONASE) 50 MCG/ACT nasal spray USE 2 SPRAYS IN BOTH  NOSTRILS DAILY 07/31/21  Yes [name], MD  glipiZIDE (GLUCOTROL) 10 MG tablet TAKE 1 TABLET BY MOUTH  TWICE DAILY BEFORE MEALS 07/10/21  Yes [name], MD  glucose blood (ONETOUCH ULTRA) test strip USE TO TEST ONCE DAILY 05/25/21  Yes [name], MD  ibuprofen (ADVIL) 800 MG tablet Take 800 mg by mouth every 8 (eight) hours as needed.   Yes [provider]  Lancets [facility] DELICA PLUS [id number]) MISC Test once daily DX e11.9 01/30/21  Yes [name], MD  metFORMIN (GLUCOPHAGE) 1000 MG tablet TAKE 1 TABLET BY MOUTH  TWICE DAILY WITH MEALS 10/09/21  Yes [name], MD  montelukast (SINGULAIR) 10 MG tablet TAKE 1 TABLET BY MOUTH AT  BEDTIME 07/17/21  Yes [name], MD  NIFEdipine (ADALAT CC) 90 MG 24 hr tablet TAKE 1 TABLET BY MOUTH  DAILY 07/10/21  Yes [name], MD  olmesartan (BENICAR) 40 MG tablet TAKE 1 TABLET BY MOUTH EVERY DAY 10/16/21  Yes [name], MD  pantoprazole (PROTONIX) 40 MG tablet TAKE 1 TABLET BY MOUTH  DAILY BEFORE BREAKFAST 07/14/21  Yes Mahala Menghini, PA-C  rosuvastatin (CRESTOR) 5 MG tablet TAKE ONE TABLET BY MOUTH EVERY MONDAY AND FRIDAY 11/18/20  Yes [name], MD  spironolactone (ALDACTONE) 50 MG tablet TAKE 1 AND 1/2 TABLETS BY MOUTH  ONCE DAILY 11/24/21  Yes [name], MD  triamcinolone cream (KENALOG) 0.1 %  Apply 1 application topically daily as needed (rash). 01/04/21  Yes [name], MD    Allergies as of 02/06/2022 - Review Complete 02/06/2022  Allergen Reaction Noted   Gabapentin Shortness Of Breath 02/13/2017   Ace inhibitors Cough 03/26/2012   Aspirin     Oxycodone Hives 04/01/2018   Penicillins Hives    Percocet [oxycodone-acetaminophen] Hives 03/03/2021   Rebif [interferon beta-1a] Other (See Comments) 05/01/2018   Statins Other (See Comments) 10/11/2012    Family History  Problem Relation Age of Onset   Heart failure Mother    Hypertension Mother    Arthritis Mother    Stroke Father    Hypertension Sister    Diabetes Sister    Heart disease Sister    Kidney failure Sister    Hypertension Sister    Hypertension Brother    Mental illness Brother  Hypertension Son    Colon cancer Maternal Aunt     Social History   Socioeconomic History   Marital status: Married    Spouse name: [name]    Number of children: 4   Years of education: 13   Highest education level: Not on file  Occupational History   Occupation: unemployed     Fish farm manager: UNEMPLOYED  Tobacco Use   Smoking status: Never   Smokeless tobacco: Never   Tobacco comments:    patient lives with a smoker   Vaping Use   Vaping Use: Never used  Substance and Sexual Activity   Alcohol use: No   Drug use: No   Sexual activity: Yes    Birth control/protection: Post-menopausal  Other Topics Concern   Not on file  Social History Narrative   Patient lives at home with husband [name].    Patient has 4 children.    Patient has a some college.    Patient is right handed.    Patient is currently not working.    Social Determinants of Health   Financial Resource Strain: Low Risk  (12/25/2021)   Overall Financial Resource Strain (CARDIA)    Difficulty of Paying Living Expenses: Not hard at all  Food Insecurity: No Food Insecurity (12/25/2021)   Hunger Vital Sign    Worried About Running Out of Food  in the Last Year: Never true    Ran Out of Food in the Last Year: Never true  Transportation Needs: No Transportation Needs (12/25/2021)   PRAPARE - Hydrologist (Medical): No    Lack of Transportation (Non-Medical): No  Physical Activity: Insufficiently Active (12/25/2021)   Exercise Vital Sign    Days of Exercise per Week: 5 days    Minutes of Exercise per Session: 20 min  Stress: No Stress Concern Present (12/25/2021)   [place]    Feeling of Stress : Not at all  Social Connections: Moderately Integrated (12/25/2021)   Social Connection and Isolation Panel [NHANES]    Frequency of Communication with Friends and Family: More than three times a week    Frequency of Social Gatherings with Friends and Family: Three times a week    Attends Religious Services: More than 4 times per year    Active Member of Clubs or Organizations: No    Attends Archivist Meetings: Never    Marital Status: Married  Human resources officer Violence: Not At Risk (12/25/2021)   Humiliation, Afraid, Rape, and Kick questionnaire    Fear of Current or Ex-Partner: No    Emotionally Abused: No    Physically Abused: No    Sexually Abused: No    Review of Systems: See HPI, otherwise negative ROS  Physical Exam: BP (!) 194/62 (BP Location: Right Arm, Patient Position: Sitting, Cuff Size: Large)   Pulse (!) 50   Temp 97.6 F (36.4 C) (Oral)   Ht 5' (1.524 m)   Wt 185 lb 12.8 oz (84.3 kg)   LMP 08/18/2012   SpO2 96%   BMI 36.29 kg/m  General:   Alert,   pleasant and cooperative in NAD Skal adenopathy. Lungs:  Clear throughout to auscultation.   No wheezes, crackles, or rhonchi. No acute distress. Heart:  Regular rate and rhythm; no murmurs, clicks, rubs,  or gallops. Abdomen: Non-distended, normal bowel sounds.  Soft and nontender without appreciable mass or hepatosplenomegaly.  Pulses:  Normal pulses  noted. Extremities:  Without  clubbing or edema.  Impression/Plan: Pleasant 66 year old lady with longstanding diabetes concerned about insidiously progressive loose stools intermittent episodes of fecal incontinence.  Symptoms present for years and have gotten much worse this year.  Change in bowel habits could be due to any combination of factors including bile salt redistribution from distant cholecystectomy, medication effect (i.e. Glucophage-even though she has been on it for years, the addition of olmesartan to her regimen earlier this year is well-known to be associated with a celiac like enteritis).  She is not having copious watery diarrhea,  so I suspect microscopic colitis or pancreatic exocrine insufficiency would be less likely possibilities. Element of irritable bowel syndrome could also be lurking in the background.  History of colonic adenoma; due for surveillance in 2025  GERD well-controlled on Protonix 40 mg daily  Gastroparesis symptoms fairly well-controlled on a gastroparesis type diet.  Recommendations:  As discussed, continue a gastroparesis diet-5-6 small meals daily  Continue Protonix 40 mg daily before meal  For your loose stools, lets try Questran or cholestyramine powder 1 packet (4 g) and water daily.  Important not to take it within 2 hours before after taking any of your other medications.  This medication is primarily used for high triglycerides but works very well for the type of bowel symptoms you are experiencing.  The major side effect would be constipation.  We will see you back in the office in 3 months  If Questran or cholestyramine is not working for you (you will know in the next 2 to 3 weeks), please call me.  Doses medication can be adjusted.  At this time, no plans to change surveillance colonoscopy scheduled for 2025.  Get back on your blood pressure medication as recommended.  Your blood pressure will be rechecked.    Notice: This  dictation was prepared with Dragon dictation along with smaller phrase technology. Any transcriptional errors that result from this process are unintentional and may not be corrected upon review.

## 2022-02-13 ENCOUNTER — Other Ambulatory Visit: Payer: Self-pay | Admitting: Family Medicine

## 2022-02-23 ENCOUNTER — Ambulatory Visit: Payer: Medicare Other | Admitting: Pulmonary Disease

## 2022-02-26 DIAGNOSIS — E119 Type 2 diabetes mellitus without complications: Secondary | ICD-10-CM | POA: Diagnosis not present

## 2022-02-26 LAB — HM DIABETES EYE EXAM

## 2022-03-29 DIAGNOSIS — M79672 Pain in left foot: Secondary | ICD-10-CM | POA: Diagnosis not present

## 2022-03-29 DIAGNOSIS — E1140 Type 2 diabetes mellitus with diabetic neuropathy, unspecified: Secondary | ICD-10-CM | POA: Diagnosis not present

## 2022-03-29 DIAGNOSIS — M79675 Pain in left toe(s): Secondary | ICD-10-CM | POA: Diagnosis not present

## 2022-03-29 DIAGNOSIS — M79674 Pain in right toe(s): Secondary | ICD-10-CM | POA: Diagnosis not present

## 2022-03-29 DIAGNOSIS — L110 Acquired keratosis follicularis: Secondary | ICD-10-CM | POA: Diagnosis not present

## 2022-03-29 DIAGNOSIS — M79671 Pain in right foot: Secondary | ICD-10-CM | POA: Diagnosis not present

## 2022-03-29 DIAGNOSIS — I739 Peripheral vascular disease, unspecified: Secondary | ICD-10-CM | POA: Diagnosis not present

## 2022-03-30 ENCOUNTER — Telehealth: Payer: Self-pay | Admitting: Family Medicine

## 2022-03-30 NOTE — Telephone Encounter (Signed)
Patient called to let Dr Moshe Cipro know she has a stuffy nose / nasal spray is not working. No fever yet. Patient asked if Dr Moshe Cipro can send something in, she does not feel like setting up mychart and not come into the office. If anything can be called in patient uses [facility]: [facility]  If not feeling well first of week will call back to come into the office.

## 2022-04-01 DIAGNOSIS — B372 Candidiasis of skin and nail: Secondary | ICD-10-CM | POA: Diagnosis not present

## 2022-04-01 DIAGNOSIS — U071 COVID-19: Secondary | ICD-10-CM | POA: Diagnosis not present

## 2022-04-01 DIAGNOSIS — R030 Elevated blood-pressure reading, without diagnosis of hypertension: Secondary | ICD-10-CM | POA: Diagnosis not present

## 2022-04-03 ENCOUNTER — Other Ambulatory Visit: Payer: Self-pay | Admitting: Family Medicine

## 2022-04-04 NOTE — Telephone Encounter (Signed)
Patient aware

## 2022-04-06 ENCOUNTER — Ambulatory Visit (INDEPENDENT_AMBULATORY_CARE_PROVIDER_SITE_OTHER): Payer: Medicare Other | Admitting: Family Medicine

## 2022-04-06 ENCOUNTER — Encounter: Payer: Self-pay | Admitting: Family Medicine

## 2022-04-06 VITALS — BP 140/70 | HR 96 | Ht 60.0 in | Wt 182.0 lb

## 2022-04-06 DIAGNOSIS — I10 Essential (primary) hypertension: Secondary | ICD-10-CM | POA: Diagnosis not present

## 2022-04-06 DIAGNOSIS — E1149 Type 2 diabetes mellitus with other diabetic neurological complication: Secondary | ICD-10-CM | POA: Diagnosis not present

## 2022-04-06 DIAGNOSIS — B369 Superficial mycosis, unspecified: Secondary | ICD-10-CM | POA: Diagnosis not present

## 2022-04-06 MED ORDER — CLOTRIMAZOLE-BETAMETHASONE 1-0.05 % EX CREA: 1.0000 | TOPICAL_CREAM | Freq: Two times a day (BID) | CUTANEOUS | 1 refills | Status: DC

## 2022-04-06 MED ORDER — TERBINAFINE HCL 250 MG PO TABS: 250.0000 mg | ORAL_TABLET | Freq: Every day | ORAL | 0 refills | Status: DC

## 2022-04-06 MED ORDER — CLONIDINE HCL 0.1 MG PO TABS: ORAL_TABLET | ORAL | 3 refills | Status: DC

## 2022-04-06 MED ORDER — FLUCONAZOLE 100 MG PO TABS: 100.0000 mg | ORAL_TABLET | Freq: Every day | ORAL | 0 refills | Status: DC

## 2022-04-06 NOTE — Patient Instructions (Signed)
Follow-up as before, call if you need me sooner  Cream is also prescribed for the rash clotrimazole/betamethasone cream apply twice daily.  You may start this cream  in the next 1 week..  2 tablets sent in to treat the rash terbinafine and fluconazole, please take as prescribed  To reduce recurrence of this rash, try to keep skin folds dry, vaseline or cornstarch are good drying agents  Blood pressure is still high new additional tablet clonidine 0.1 mg 1 at bedtime

## 2022-04-08 DIAGNOSIS — B369 Superficial mycosis, unspecified: Secondary | ICD-10-CM | POA: Insufficient documentation

## 2022-04-08 NOTE — Assessment & Plan Note (Signed)
Terbinafine, fluconazole and clotrimazole/ betameth prescribed, advised to use drying agent like cornstarh or vaseline once resolved

## 2022-04-08 NOTE — Assessment & Plan Note (Signed)
Uncontrlled , bedtime clonidine added DASH diet and commitment to daily physical activity for a minimum of 30 minutes discussed and encouraged, as a part of hypertension management. The importance of attaining a healthy weight is also discussed.     04/06/2022    1:36 PM 04/06/2022    1:19 PM 04/06/2022    1:15 PM 02/06/2022   10:29 AM 02/06/2022    9:36 AM 01/04/2022   10:41 AM 01/04/2022   10:02 AM  BP/Weight  Systolic BP 373 428 768 115 726 203 559  Diastolic BP 70 82 79 80 62 74 70  Wt. (Lbs)   182.04  185.8    BMI   35.55 kg/m2  36.29 kg/m2

## 2022-04-08 NOTE — Progress Notes (Signed)
KYANI SIMKIN     MRN: [id number]      DOB: 07-02-1955   HPI [name] is here for follow up and re-evaluation of chronic medical conditions, medication management and review of any available recent lab and radiology data.  Preventive health is updated, specifically  Cancer screening and Immunization.   Questions or concerns regarding consultations or procedures which the PT has had in the interim are  addressed. The PT denies any adverse reactions to current medications since the last visit.  C/o itching of skin over pubic area, rash present , no drainage noted ROS Denies recent fever or chills. Denies sinus pressure, nasal congestion, ear pain or sore throat. Denies chest congestion, productive cough or wheezing. Denies chest pains, palpitations and leg swelling Denies abdominal pain, nausea, vomiting,diarrhea or constipation.   Denies dysuria, frequency, hesitancy or incontinence. C/o right leg pain Denies headaches, seizures, numbness, or tingling. Denies depression, anxiety or insomnia.   PE  BP (!) 140/70   Pulse 96   Ht 5' (1.524 m)   Wt 182 lb 0.6 oz (82.6 kg)   LMP 08/18/2012   SpO2 95%   BMI 35.55 kg/m   Patient alert and oriented and in no cardiopulmonary distress.  HEENT: No facial asymmetry, EOMI,     Neck supple .  Chest: Clear to auscultation bilaterally.  CVS: S1, S2 no murmurs, no S3.Regular rate.  ABD: Soft non tender.   Ext: No edema  MS: Adequate ROM spine, shoulders, hips and knees.  Skin: fungal and yeast infection present in pubic area.  Psych: Good eye contact, normal affect. Memory intact not anxious or depressed appearing.  CNS: CN 2-12 intact, power,  normal throughout.no focal deficits noted.   Assessment & Plan  Dermatomycosis Terbinafine, fluconazole and clotrimazole/ betameth prescribed, advised to use drying agent like cornstarh or vaseline once resolved  Type 2 diabetes mellitus with neurological complications [facility]) Ms.  Michaela Peterson is reminded of the importance of commitment to daily physical activity for 30 minutes or more, as able and the need to limit carbohydrate intake to 30 to 60 grams per meal to help with blood sugar control.   The need to take medication as prescribed, test blood sugar as directed, and to call between visits if there is a concern that blood sugar is uncontrolled is also discussed.   [name] is reminded of the importance of daily foot exam, annual eye examination, and good blood sugar, blood pressure and cholesterol control.     Latest Ref Rng & Units 01/02/2022    1:02 PM 11/17/2021   10:09 AM 09/28/2021   10:48 AM 09/25/2021   10:29 AM 06/26/2021    9:56 AM  Diabetic Labs  HbA1c 4.8 - 5.6 % 7.2   7.4   7.2   Micro/Creat Ratio 0 - 29 mg/g creat  40      Chol 100 - 199 mg/dL    135    HDL >39 mg/dL    63    Calc LDL 0 - 99 mg/dL    60    Triglycerides 0 - 149 mg/dL    56    Creatinine 0.57 - 1.00 mg/dL 0.96    1.08  0.96       04/06/2022    1:36 PM 04/06/2022    1:19 PM 04/06/2022    1:15 PM 02/06/2022   10:29 AM 02/06/2022    9:36 AM 01/04/2022   10:41 AM 01/04/2022   10:02 AM  BP/Weight  Systolic BP 277 412 878 676 720 947 096  Diastolic BP 70 82 79 80 62 74 70  Wt. (Lbs)   182.04  185.8    BMI   35.55 kg/m2  36.29 kg/m2        Latest Ref Rng & Units 02/26/2022   12:00 AM 08/23/2021   10:00 AM  Foot/eye exam completion dates  Eye Exam No Retinopathy No Retinopathy       Foot Form Completion   Done     This result is from an external source.     Updated lab needed at/ before next visit.    Hypertension goal BP (blood pressure) < 140/90 Uncontrlled , bedtime clonidine added DASH diet and commitment to daily physical activity for a minimum of 30 minutes discussed and encouraged, as a part of hypertension management. The importance of attaining a healthy weight is also discussed.     04/06/2022    1:36 PM 04/06/2022    1:19 PM 04/06/2022    1:15 PM 02/06/2022    10:29 AM 02/06/2022    9:36 AM 01/04/2022   10:41 AM 01/04/2022   10:02 AM  BP/Weight  Systolic BP 283 662 947 654 650 354 656  Diastolic BP 70 82 79 80 62 74 70  Wt. (Lbs)   182.04  185.8    BMI   35.55 kg/m2  36.29 kg/m2         Morbid obesity (HCC)  Patient re-educated about  the importance of commitment to a  minimum of 150 minutes of exercise per week as able.  The importance of healthy food choices with portion control discussed, as well as eating regularly and within a 12 hour window most days. The need to choose "clean , green" food 50 to 75% of the time is discussed, as well as to make water the primary drink and set a goal of 64 ounces water daily.       04/06/2022    1:15 PM 02/06/2022    9:36 AM 01/04/2022    9:59 AM  Weight /BMI  Weight 182 lb 0.6 oz 185 lb 12.8 oz 184 lb 1.3 oz  Height 5' (1.524 m) 5' (1.524 m) 5' (1.524 m)  BMI 35.55 kg/m2 36.29 kg/m2 35.95 kg/m2

## 2022-04-08 NOTE — Assessment & Plan Note (Signed)
Ms. Gammon is reminded of the importance of commitment to daily physical activity for 30 minutes or more, as able and the need to limit carbohydrate intake to 30 to 60 grams per meal to help with blood sugar control.   The need to take medication as prescribed, test blood sugar as directed, and to call between visits if there is a concern that blood sugar is uncontrolled is also discussed.   Ms. Cookston is reminded of the importance of daily foot exam, annual eye examination, and good blood sugar, blood pressure and cholesterol control.     Latest Ref Rng & Units 01/02/2022    1:02 PM 11/17/2021   10:09 AM 09/28/2021   10:48 AM 09/25/2021   10:29 AM 06/26/2021    9:56 AM  Diabetic Labs  HbA1c 4.8 - 5.6 % 7.2   7.4   7.2   Micro/Creat Ratio 0 - 29 mg/g creat  40      Chol 100 - 199 mg/dL    135    HDL >39 mg/dL    63    Calc LDL 0 - 99 mg/dL    60    Triglycerides 0 - 149 mg/dL    56    Creatinine 0.57 - 1.00 mg/dL 0.96    1.08  0.96       04/06/2022    1:36 PM 04/06/2022    1:19 PM 04/06/2022    1:15 PM 02/06/2022   10:29 AM 02/06/2022    9:36 AM 01/04/2022   10:41 AM 01/04/2022   10:02 AM  BP/Weight  Systolic BP 517 616 073 710 626 948 546  Diastolic BP 70 82 79 80 62 74 70  Wt. (Lbs)   182.04  185.8    BMI   35.55 kg/m2  36.29 kg/m2        Latest Ref Rng & Units 02/26/2022   12:00 AM 08/23/2021   10:00 AM  Foot/eye exam completion dates  Eye Exam No Retinopathy No Retinopathy       Foot Form Completion   Done     This result is from an external source.     Updated lab needed at/ before next visit.

## 2022-04-08 NOTE — Assessment & Plan Note (Signed)
  Patient re-educated about  the importance of commitment to a  minimum of 150 minutes of exercise per week as able.  The importance of healthy food choices with portion control discussed, as well as eating regularly and within a 12 hour window most days. The need to choose "clean , green" food 50 to 75% of the time is discussed, as well as to make water the primary drink and set a goal of 64 ounces water daily.       04/06/2022    1:15 PM 02/06/2022    9:36 AM 01/04/2022    9:59 AM  Weight /BMI  Weight 182 lb 0.6 oz 185 lb 12.8 oz 184 lb 1.3 oz  Height 5' (1.524 m) 5' (1.524 m) 5' (1.524 m)  BMI 35.55 kg/m2 36.29 kg/m2 35.95 kg/m2

## 2022-04-16 ENCOUNTER — Other Ambulatory Visit: Payer: Self-pay

## 2022-04-16 DIAGNOSIS — D509 Iron deficiency anemia, unspecified: Secondary | ICD-10-CM

## 2022-04-20 DIAGNOSIS — E1149 Type 2 diabetes mellitus with other diabetic neurological complication: Secondary | ICD-10-CM | POA: Diagnosis not present

## 2022-04-20 DIAGNOSIS — E785 Hyperlipidemia, unspecified: Secondary | ICD-10-CM | POA: Diagnosis not present

## 2022-04-20 DIAGNOSIS — I10 Essential (primary) hypertension: Secondary | ICD-10-CM | POA: Diagnosis not present

## 2022-04-21 LAB — CMP14+EGFR
ALT: 13 IU/L (ref 0–32)
AST: 14 IU/L (ref 0–40)
Albumin/Globulin Ratio: 1.7 (ref 1.2–2.2)
Albumin: 4.5 g/dL (ref 3.9–4.9)
Alkaline Phosphatase: 92 IU/L (ref 44–121)
BUN/Creatinine Ratio: 15 (ref 12–28)
BUN: 18 mg/dL (ref 8–27)
Bilirubin Total: 0.2 mg/dL (ref 0.0–1.2)
CO2: 15 mmol/L — ABNORMAL LOW (ref 20–29)
Calcium: 9.5 mg/dL (ref 8.7–10.3)
Chloride: 109 mmol/L — ABNORMAL HIGH (ref 96–106)
Creatinine, Ser: 1.2 mg/dL — ABNORMAL HIGH (ref 0.57–1.00)
Globulin, Total: 2.7 g/dL (ref 1.5–4.5)
Glucose: 106 mg/dL — ABNORMAL HIGH (ref 70–99)
Potassium: 4.1 mmol/L (ref 3.5–5.2)
Sodium: 143 mmol/L (ref 134–144)
Total Protein: 7.2 g/dL (ref 6.0–8.5)
eGFR: 50 mL/min/{1.73_m2} — ABNORMAL LOW (ref >59)

## 2022-04-21 LAB — LIPID PANEL
Chol/HDL Ratio: 2.6 ratio (ref 0.0–4.4)
Cholesterol, Total: 147 mg/dL (ref 100–199)
HDL: 56 mg/dL (ref >39)
LDL Chol Calc (NIH): 75 mg/dL (ref 0–99)
Triglycerides: 85 mg/dL (ref 0–149)
VLDL Cholesterol Cal: 16 mg/dL (ref 5–40)

## 2022-04-21 LAB — MAGNESIUM: Magnesium: 1.5 mg/dL — ABNORMAL LOW (ref 1.6–2.3)

## 2022-04-21 LAB — HEMOGLOBIN A1C
Est. average glucose Bld gHb Est-mCnc: 174 mg/dL
Hgb A1c MFr Bld: 7.7 % — ABNORMAL HIGH (ref 4.8–5.6)

## 2022-04-26 ENCOUNTER — Ambulatory Visit (INDEPENDENT_AMBULATORY_CARE_PROVIDER_SITE_OTHER): Payer: Medicare Other | Admitting: Family Medicine

## 2022-04-26 ENCOUNTER — Encounter: Payer: Self-pay | Admitting: Family Medicine

## 2022-04-26 VITALS — BP 124/72 | HR 72 | Ht 60.0 in | Wt 182.1 lb

## 2022-04-26 DIAGNOSIS — N1831 Chronic kidney disease, stage 3a: Secondary | ICD-10-CM

## 2022-04-26 DIAGNOSIS — I10 Essential (primary) hypertension: Secondary | ICD-10-CM | POA: Diagnosis not present

## 2022-04-26 DIAGNOSIS — Z1231 Encounter for screening mammogram for malignant neoplasm of breast: Secondary | ICD-10-CM

## 2022-04-26 DIAGNOSIS — E1149 Type 2 diabetes mellitus with other diabetic neurological complication: Secondary | ICD-10-CM | POA: Diagnosis not present

## 2022-04-26 DIAGNOSIS — J309 Allergic rhinitis, unspecified: Secondary | ICD-10-CM | POA: Diagnosis not present

## 2022-04-26 DIAGNOSIS — E785 Hyperlipidemia, unspecified: Secondary | ICD-10-CM

## 2022-04-26 MED ORDER — AZELASTINE HCL 0.1 % NA SOLN: 2.0000 | Freq: Two times a day (BID) | NASAL | 12 refills | Status: DC

## 2022-04-26 NOTE — Patient Instructions (Addendum)
F/u in 13 weeks,with foot exam,  call if you need me sooner  No medication changes  Please schedule mammogram at checkout  Astelin sent to [place] will be referred to Nephrologist  It is important that you exercise regularly at least 30 minutes 5 times a week. If you develop chest pain, have severe difficulty breathing, or feel very tired, stop exercising immediately and seek medical attention    Thanks for choosing [facility], we consider it a privelige to serve you.

## 2022-04-29 ENCOUNTER — Encounter: Payer: Self-pay | Admitting: Family Medicine

## 2022-04-29 DIAGNOSIS — N183 Chronic kidney disease, stage 3 unspecified: Secondary | ICD-10-CM | POA: Insufficient documentation

## 2022-04-29 NOTE — Assessment & Plan Note (Signed)
Uncontrolled diabetes and long standing HTN, refer Nephrology

## 2022-04-29 NOTE — Assessment & Plan Note (Signed)
Hyperlipidemia:Low fat diet discussed and encouraged.   Lipid Panel  Lab Results  Component Value Date   CHOL 147 04/20/2022   HDL 56 04/20/2022   LDLCALC 75 04/20/2022   TRIG 85 04/20/2022   CHOLHDL 2.6 04/20/2022     Controlled, no change in medication

## 2022-04-29 NOTE — Assessment & Plan Note (Signed)
DASH diet and commitment to daily physical activity for a minimum of 30 minutes discussed and encouraged, as a part of hypertension management. The importance of attaining a healthy weight is also discussed.     04/26/2022   11:05 AM 04/26/2022   10:27 AM 04/06/2022    1:36 PM 04/06/2022    1:19 PM 04/06/2022    1:15 PM 02/06/2022   10:29 AM 02/06/2022    9:36 AM  BP/Weight  Systolic BP [id number] 123456 0000000 [id number]  Diastolic BP 72 68 70 82 79 80 62  Wt. (Lbs)  182.12   182.04  185.8  BMI  35.57 kg/m2   35.55 kg/m2  36.29 kg/m2     Controlled, no change in medication

## 2022-04-29 NOTE — Assessment & Plan Note (Signed)
Michaela Peterson is reminded of the importance of commitment to daily physical activity for 30 minutes or more, as able and the need to limit carbohydrate intake to 30 to 60 grams per meal to help with blood sugar control.   The need to take medication as prescribed, test blood sugar as directed, and to call between visits if there is a concern that blood sugar is uncontrolled is also discussed.   Michaela Peterson is reminded of the importance of daily foot exam, annual eye examination, and good blood sugar, blood pressure and cholesterol control.     Latest Ref Rng & Units 04/20/2022   11:07 AM 01/02/2022    1:02 PM 11/17/2021   10:09 AM 09/28/2021   10:48 AM 09/25/2021   10:29 AM  Diabetic Labs  HbA1c 4.8 - 5.6 % 7.7  7.2   7.4    Micro/Creat Ratio 0 - 29 mg/g creat   40     Chol 100 - 199 mg/dL 147     135   HDL >39 mg/dL 56     63   Calc LDL 0 - 99 mg/dL 75     60   Triglycerides 0 - 149 mg/dL 85     56   Creatinine 0.57 - 1.00 mg/dL 1.20  0.96    1.08       04/26/2022   11:05 AM 04/26/2022   10:27 AM 04/06/2022    1:36 PM 04/06/2022    1:19 PM 04/06/2022    1:15 PM 02/06/2022   10:29 AM 02/06/2022    9:36 AM  BP/Weight  Systolic BP [id number] 0000000 [id number] 123456 0000000 [id number]  Diastolic BP 72 68 70 82 79 80 62  Wt. (Lbs)  182.12   182.04  185.8  BMI  35.57 kg/m2   35.55 kg/m2  36.29 kg/m2      Latest Ref Rng & Units 02/26/2022   12:00 AM 08/23/2021   10:00 AM  Foot/eye exam completion dates  Eye Exam No Retinopathy No Retinopathy       Foot Form Completion   Done     This result is from an external source.      Not at goal refuses additonal medication states she will get It controled

## 2022-04-29 NOTE — Progress Notes (Signed)
Michaela Peterson     MRN: [id number]      DOB: [birth year]   HPI [name] is here for follow up and re-evaluation of chronic medical conditions, medication management and review of any available recent lab and radiology data.  Preventive health is updated, specifically  Cancer screening and Immunization.   Questions or concerns regarding consultations or procedures which the PT has had in the interim are  addressed. The PT denies any adverse reactions to current medications since the last visit.  Reports marked improvement , and resolution of itchy rash Denies polyuria, polydipsia, blurred vision , or hypoglycemic episodes. Knows blood sugar recently elevated , vows to decrease this with no med change which she is refusing Increased and uncontrolled allergy symptoms   ROS Denies recent fever or chills. Denies sinus pressure, nasal congestion, ear pain or sore throat. Denies chest congestion, productive cough or wheezing. Denies chest pains, palpitations and leg swelling Denies abdominal pain, nausea, vomiting,diarrhea or constipation.   Denies dysuria, frequency, hesitancy or incontinence. Denies joint pain, swelling and limitation in mobility. Denies headaches, seizures, numbness, or tingling. Denies depression, anxiety or insomnia. Denies skin break down or rash.   PE  BP 124/72   Pulse 72   Ht 5' (1.524 m)   Wt 182 lb 1.9 oz (82.6 kg)   LMP 08/18/2012   SpO2 94%   BMI 35.57 kg/m   Patient alert and oriented and in no cardiopulmonary distress.  HEENT: No facial asymmetry, EOMI,     Neck supple .No sinus tenderness, positive nasal congestion  Chest: Clear to auscultation bilaterally.  CVS: S1, S2 no murmurs, no S3.Regular rate.  ABD: Soft non tender.   Ext: No edema  MS: Adequate ROM spine, shoulders, hips and knees.  Skin: Intact, no ulcerations or rash noted.  Psych: Good eye contact, normal affect. Memory intact not anxious or depressed appearing.  CNS: CN  2-12 intact, power,  normal throughout.no focal deficits noted.   Assessment & Plan  CKD (chronic kidney disease) stage 3, GFR 30-59 ml/min (HCC) Uncontrolled diabetes and long standing HTN, refer Nephrology  Type 2 diabetes mellitus with neurological complications [facility]) Michaela Peterson is reminded of the importance of commitment to daily physical activity for 30 minutes or more, as able and the need to limit carbohydrate intake to 30 to 60 grams per meal to help with blood sugar control.   The need to take medication as prescribed, test blood sugar as directed, and to call between visits if there is a concern that blood sugar is uncontrolled is also discussed.   Michaela Peterson is reminded of the importance of daily foot exam, annual eye examination, and good blood sugar, blood pressure and cholesterol control.     Latest Ref Rng & Units 04/20/2022   11:07 AM 01/02/2022    1:02 PM 11/17/2021   10:09 AM 09/28/2021   10:48 AM 09/25/2021   10:29 AM  Diabetic Labs  HbA1c 4.8 - 5.6 % 7.7  7.2   7.4    Micro/Creat Ratio 0 - 29 mg/g creat   40     Chol 100 - 199 mg/dL 147     135   HDL >39 mg/dL 56     63   Calc LDL 0 - 99 mg/dL 75     60   Triglycerides 0 - 149 mg/dL 85     56   Creatinine 0.57 - 1.00 mg/dL 1.20  0.96    1.08  04/26/2022   11:05 AM 04/26/2022   10:27 AM 04/06/2022    1:36 PM 04/06/2022    1:19 PM 04/06/2022    1:15 PM 02/06/2022   10:29 AM 02/06/2022    9:36 AM  BP/Weight  Systolic BP A999333 0000000 [id number] 123456 0000000 [id number]  Diastolic BP 72 68 70 82 79 80 62  Wt. (Lbs)  182.12   182.04  185.8  BMI  35.57 kg/m2   35.55 kg/m2  36.29 kg/m2      Latest Ref Rng & Units 02/26/2022   12:00 AM 08/23/2021   10:00 AM  Foot/eye exam completion dates  Eye Exam No Retinopathy No Retinopathy       Foot Form Completion   Done     This result is from an external source.      Not at goal refuses additonal medication states she will get It controled  Hypertension goal BP (blood pressure) <  140/90 DASH diet and commitment to daily physical activity for a minimum of 30 minutes discussed and encouraged, as a part of hypertension management. The importance of attaining a healthy weight is also discussed.     04/26/2022   11:05 AM 04/26/2022   10:27 AM 04/06/2022    1:36 PM 04/06/2022    1:19 PM 04/06/2022    1:15 PM 02/06/2022   10:29 AM 02/06/2022    9:36 AM  BP/Weight  Systolic BP A999333 0000000 [id number] 123456 0000000 [id number] [id number]  Diastolic BP 72 68 70 82 79 80 62  Wt. (Lbs)  182.12   182.04  185.8  BMI  35.57 kg/m2   35.55 kg/m2  36.29 kg/m2     Controlled, no change in medication   Hyperlipidemia LDL goal <100 Hyperlipidemia:Low fat diet discussed and encouraged.   Lipid Panel  Lab Results  Component Value Date   CHOL 147 04/20/2022   HDL 56 04/20/2022   LDLCALC 75 04/20/2022   TRIG 85 04/20/2022   CHOLHDL 2.6 04/20/2022     Controlled, no change in medication   Allergic rhinitis Unconrolled add Astelin continue Flonase and double zyrtec short term

## 2022-04-29 NOTE — Assessment & Plan Note (Signed)
Unconrolled add Astelin continue Flonase and double zyrtec short term

## 2022-05-01 ENCOUNTER — Telehealth: Payer: Self-pay | Admitting: Family Medicine

## 2022-05-01 NOTE — Telephone Encounter (Signed)
Pt called stating that she is needing a new DM meter. Wants to know if you can please send an order for this to [place]?    DM Meter to [facility]

## 2022-05-02 ENCOUNTER — Other Ambulatory Visit: Payer: Self-pay

## 2022-05-02 DIAGNOSIS — E1149 Type 2 diabetes mellitus with other diabetic neurological complication: Secondary | ICD-10-CM

## 2022-05-02 MED ORDER — ONETOUCH ULTRASOFT LANCETS MISC: 12 refills | Status: DC

## 2022-05-02 MED ORDER — ONETOUCH ULTRA 2 W/DEVICE KIT: 1.0000 | PACK | Freq: Once | 0 refills | Status: AC

## 2022-05-07 ENCOUNTER — Other Ambulatory Visit: Payer: Self-pay | Admitting: Gastroenterology

## 2022-05-07 ENCOUNTER — Other Ambulatory Visit: Payer: Self-pay | Admitting: Family Medicine

## 2022-05-08 ENCOUNTER — Ambulatory Visit: Payer: Medicare Other | Admitting: Internal Medicine

## 2022-05-08 ENCOUNTER — Encounter: Payer: Self-pay | Admitting: Internal Medicine

## 2022-05-08 VITALS — BP 181/91 | HR 56 | Temp 98.2°F | Ht 60.0 in | Wt 180.0 lb

## 2022-05-08 DIAGNOSIS — R197 Diarrhea, unspecified: Secondary | ICD-10-CM

## 2022-05-08 DIAGNOSIS — K219 Gastro-esophageal reflux disease without esophagitis: Secondary | ICD-10-CM | POA: Diagnosis not present

## 2022-05-08 NOTE — Progress Notes (Signed)
Primary Care Physician:  [name], MD Primary Gastroenterologist:  Dr. [name]  Pre-Procedure History & Physical: HPI:  Michaela Peterson is a 67 y.o. female here for follow-up of GERD/gastroparesis, diarrhea.  She does take Questran intermittently for diarrhea.  It constipates her at times.  Not having any rectal bleeding.  History of colonic adenoma/due for surveillance colonoscopy 2024. GERD well-controlled on Protonix.  No dysphagia.  Only 1 episode of vomiting in since she was seen here previously 4 months ago;  otherwise tolerating her diet. Blood pressure up.  Feels bad every day has a headache frequently.  Has OSA.  Does not wear her CPAP  Past Medical History:  Diagnosis Date   Adnexal mass    Left; followed by Dr. [name]   Anemia, iron deficiency    Arthritis    Chronic low back pain    Diabetes mellitus, type 2 (HCC)    Essential hypertension    Gastroparesis    GERD (gastroesophageal reflux disease)    MS (multiple sclerosis) (HCC)    Nonalcoholic fatty liver disease    Obesity    Sleep apnea     Past Surgical History:  Procedure Laterality Date   AGILE CAPSULE N/A 08/11/2014   Procedure: AGILE CAPSULE;  Surgeon: Daneil Dolin, MD;  [facility];  Service: Endoscopy;  Laterality: N/A;  0700   BALLOON DILATION N/A 03/07/2021   Procedure: BALLOON DILATION;  Surgeon: [name], DO;  [facility];  Service: Endoscopy;  Laterality: N/A;   BIOPSY  03/07/2021   Procedure: BIOPSY;  Surgeon: [name], DO;  [facility];  Service: Endoscopy;;   BLADDER SUSPENSION     CESAREAN SECTION     x2    CHOLECYSTECTOMY     COLONOSCOPY  2010   Dr. [name]: tubular adenoma, few scattered diverticula   COLONOSCOPY N/A 10/01/2013   Dr. Pixie Casino preparation. Normal rectum. Normal colonic mucosa   COLONOSCOPY N/A 11/12/2018   sigmoid and descending colon diverticulosis. Two 4-5 mm polyps in ascending colon. (tubular adenomas).  Surveillance in 5 years   DILATION AND CURETTAGE OF UTERUS     4   ESOPHAGOGASTRODUODENOSCOPY  2010   Dr. [name]: normal esophagus, small hiatal hernia, questionable pale duodenal mucosa but negative for celiac sprue.    ESOPHAGOGASTRODUODENOSCOPY N/A 10/01/2013   Dr. Masen Salvas:gastric polyps-status post biopsy. Otherwise, normal EGD. fundic gland polyp, negative H.pylori   ESOPHAGOGASTRODUODENOSCOPY (EGD) WITH PROPOFOL N/A 03/07/2021   Procedure: ESOPHAGOGASTRODUODENOSCOPY (EGD) WITH PROPOFOL;  Surgeon: [name], DO;  [facility];  Service: Endoscopy;  Laterality: N/A;  12:00pm   GIVENS CAPSULE STUDY N/A 09/08/2014   Poor prep but overall unremarkable    KNEE ARTHROSCOPY W/ DEBRIDEMENT     LAPAROSCOPY ABDOMEN DIAGNOSTIC     lysis of adhesions   POLYPECTOMY  11/12/2018   Procedure: POLYPECTOMY;  Surgeon: Daneil Dolin, MD;  [facility];  Service: Endoscopy;;   ROTATOR CUFF REPAIR     TENDON LENGTHENING     Left wrist   UMBILICAL HERNIA REPAIR      Prior to Admission medications   Medication Sig Start Date End Date Taking? Authorizing Provider  aspirin EC 81 MG tablet Take 81 mg by mouth daily.   Yes [provider]  azelastine (ASTELIN) 0.1 % nasal spray Place 2 sprays into both nostrils 2 (two) times daily. Use in each nostril as directed 04/26/22  Yes Tula Nakayama  E, MD  Blood Glucose Monitoring Suppl (ONE TOUCH ULTRA 2) w/Device KIT USE TO TEST ONCE DAILY 07/19/20  Yes [name], MD  cetirizine (ZYRTEC) 10 MG tablet Take 1 tablet (10 mg total) by mouth daily. 01/04/22  Yes [name], MD  Cholecalciferol (VITAMIN D3) 50 MCG (2000 UT) TABS Take 2,000 Units by mouth daily.   Yes [provider]  cholestyramine (QUESTRAN) 4 g packet Take 1 packet (4 g total) by mouth daily. 02/06/22  Yes Azalyn Sliwa, Cristopher Estimable, MD  clotrimazole-betamethasone (LOTRISONE) cream Apply 1 Application topically 2 (two) times daily. 04/06/22  Yes  [name], MD  diclofenac Sodium (VOLTAREN) 1 % GEL Apply topically 4 (four) times daily.   Yes [provider]  fluorometholone (FML) 0.1 % ophthalmic suspension Place 1 drop into both eyes 3 (three) times daily. 02/24/21  Yes [provider]  fluticasone (FLONASE) 50 MCG/ACT nasal spray USE 2 SPRAYS IN BOTH  NOSTRILS DAILY 07/31/21  Yes [name], MD  glipiZIDE (GLUCOTROL) 10 MG tablet TAKE 1 TABLET BY MOUTH TWICE  DAILY BEFORE MEALS 05/07/22  Yes [name], MD  ibuprofen (ADVIL) 800 MG tablet Take 800 mg by mouth every 8 (eight) hours as needed.   Yes [provider]  Lancets [facility] DELICA PLUS [id number]) MISC Test once daily DX e11.9 01/30/21  Yes [name], MD  Lancets [facility] ULTRASOFT) lancets Use as instructed 05/02/22  Yes [name], MD  metFORMIN (GLUCOPHAGE) 1000 MG tablet TAKE 1 TABLET BY MOUTH  TWICE DAILY WITH MEALS 10/09/21  Yes [name], MD  montelukast (SINGULAIR) 10 MG tablet TAKE 1 TABLET BY MOUTH AT  BEDTIME 07/17/21  Yes [name], MD  NIFEdipine (ADALAT CC) 90 MG 24 hr tablet TAKE 1 TABLET BY MOUTH  DAILY 07/10/21  Yes [name], MD  olmesartan (BENICAR) 40 MG tablet TAKE 1 TABLET BY MOUTH EVERY DAY 10/16/21  Yes [name], MD  [facility] ULTRA test strip USE TO TEST ONCE DAILY 04/03/22  Yes [name], MD  pantoprazole (PROTONIX) 40 MG tablet TAKE 1 TABLET BY MOUTH  DAILY BEFORE BREAKFAST 07/14/21  Yes Mahala Menghini, PA-C  RESTASIS 0.05 % ophthalmic emulsion 1 drop 2 (two) times daily. 02/28/22  Yes [provider]  rosuvastatin (CRESTOR) 5 MG tablet TAKE ONE TABLET BY MOUTH EVERY MONDAY AND FRIDAY 02/13/22  Yes [name], MD  spironolactone (ALDACTONE) 50 MG tablet TAKE 1 AND 1/2 TABLETS BY MOUTH  ONCE DAILY 11/24/21  Yes [name], MD  triamcinolone cream (KENALOG) 0.1 % Apply 1 application topically daily as needed (rash).  01/04/21  Yes [name], MD    Allergies as of 05/08/2022 - Review Complete 05/08/2022  Allergen Reaction Noted   Gabapentin Shortness Of Breath 02/13/2017   Ace inhibitors Cough 03/26/2012   Aspirin     Oxycodone Hives 04/01/2018   Penicillins Hives    Percocet [oxycodone-acetaminophen] Hives 03/03/2021   Rebif [interferon beta-1a] Other (See Comments) 05/01/2018   Statins Other (See Comments) 10/11/2012    Family History  Problem Relation Age of Onset   Heart failure Mother    Hypertension Mother    Arthritis Mother    Stroke Father    Hypertension Sister    Diabetes Sister    Heart disease Sister    Kidney failure Sister    Hypertension Sister    Hypertension Brother    Mental illness Brother  Hypertension Son    Colon cancer Maternal Aunt     Social History   Socioeconomic History   Marital status: Married    Spouse name: [name]    Number of children: 4   Years of education: 13   Highest education level: Not on file  Occupational History   Occupation: unemployed     Fish farm manager: UNEMPLOYED  Tobacco Use   Smoking status: Never   Smokeless tobacco: Never   Tobacco comments:    patient lives with a smoker   Vaping Use   Vaping Use: Never used  Substance and Sexual Activity   Alcohol use: No   Drug use: No   Sexual activity: Yes    Birth control/protection: Post-menopausal  Other Topics Concern   Not on file  Social History Narrative   Patient lives at home with husband [name].    Patient has 4 children.    Patient has a some college.    Patient is right handed.    Patient is currently not working.    Social Determinants of Health   Financial Resource Strain: Low Risk  (12/25/2021)   Overall Financial Resource Strain (CARDIA)    Difficulty of Paying Living Expenses: Not hard at all  Food Insecurity: No Food Insecurity (12/25/2021)   Hunger Vital Sign    Worried About Running Out of Food in the Last Year: Never true    Ran Out of Food in the  Last Year: Never true  Transportation Needs: No Transportation Needs (12/25/2021)   PRAPARE - Hydrologist (Medical): No    Lack of Transportation (Non-Medical): No  Physical Activity: Insufficiently Active (12/25/2021)   Exercise Vital Sign    Days of Exercise per Week: 5 days    Minutes of Exercise per Session: 20 min  Stress: No Stress Concern Present (12/25/2021)   [place]    Feeling of Stress : Not at all  Social Connections: Moderately Integrated (12/25/2021)   Social Connection and Isolation Panel [NHANES]    Frequency of Communication with Friends and Family: More than three times a week    Frequency of Social Gatherings with Friends and Family: Three times a week    Attends Religious Services: More than 4 times per year    Active Member of Clubs or Organizations: No    Attends Archivist Meetings: Never    Marital Status: Married  Human resources officer Violence: Not At Risk (12/25/2021)   Humiliation, Afraid, Rape, and Kick questionnaire    Fear of Current or Ex-Partner: No    Emotionally Abused: No    Physically Abused: No    Sexually Abused: No    Review of Systems: See HPI, otherwise negative ROS  Physical Exam: BP (!) 168/82 (BP Location: Right Arm, Patient Position: Sitting, Cuff Size: Large)   Pulse 60   Temp 98.2 F (36.8 C) (Oral)   Ht 5' (1.524 m)   Wt 180 lb (81.6 kg)   LMP 08/18/2012   SpO2 97%   BMI 35.15 kg/m  General:   Alert,  Well-developed, well-nourished, pleasant and cooperative in NAD Abdomen: Non-distended, normal bowel sounds.  Soft and nontender without appreciable mass or hepatosplenomegaly.   Impression/Plan: 66 year old lady with GERD/gastroparesis doing well the vast majority of the time.  No alarm symptoms.  Diarrhea multifactorial in origin responded to Questran.  Will need to titrate the dose. Constitutional symptoms may well be due  to OSA.  Colonic adenoma; due for surveillance colonoscopy 2025  Recommendations:  Continue Protonix 40 mg daily 30 minutes before breakfast  Continue Questran or cholestyramine powder as directed.  May half the dose or take a third of the regular dose and water to avoid constipation..  Please make sure you do not take it within 2 hours of your other medications.  Please wear your CPAP  Ask Dr. Moshe Cipro about out adnexal mass followed by Dr. [name] previously  Continue on a gastroparesis diet.  Blood pressure up today on recheck.  Follow-up with Dr. Moshe Cipro.  Office visit in 1 year for follow-up and to set up a surveillance colonoscopy.    Notice: This dictation was prepared with Dragon dictation along with smaller phrase technology. Any transcriptional errors that result from this process are unintentional and may not be corrected upon review.

## 2022-05-08 NOTE — Patient Instructions (Addendum)
It was good to see you again today!  Continue Protonix 40 mg daily 30 minutes before breakfast  Continue Questran or cholestyramine powder as directed.  May half the dose or take a third of the regular dose and water to avoid constipation..  Please make sure you do not take it within 2 hours of your other medications.  Please wear your CPAP  Ask Dr. Moshe Cipro about out adnexal mass followed by Dr. [name] previously  Continue on a gastroparesis diet.  Blood pressure up today on recheck.  Follow-up with Dr. Moshe Cipro.  Office visit in 1 year for follow-up and to set up a surveillance colonoscopy.

## 2022-05-10 ENCOUNTER — Encounter: Payer: Self-pay | Admitting: Radiology

## 2022-06-14 ENCOUNTER — Other Ambulatory Visit (HOSPITAL_COMMUNITY): Payer: Self-pay | Admitting: Nephrology

## 2022-06-14 DIAGNOSIS — D638 Anemia in other chronic diseases classified elsewhere: Secondary | ICD-10-CM

## 2022-06-14 DIAGNOSIS — E1122 Type 2 diabetes mellitus with diabetic chronic kidney disease: Secondary | ICD-10-CM | POA: Diagnosis not present

## 2022-06-14 DIAGNOSIS — R809 Proteinuria, unspecified: Secondary | ICD-10-CM | POA: Diagnosis not present

## 2022-06-14 DIAGNOSIS — I5032 Chronic diastolic (congestive) heart failure: Secondary | ICD-10-CM | POA: Diagnosis not present

## 2022-06-14 DIAGNOSIS — D508 Other iron deficiency anemias: Secondary | ICD-10-CM | POA: Diagnosis not present

## 2022-06-14 DIAGNOSIS — E1129 Type 2 diabetes mellitus with other diabetic kidney complication: Secondary | ICD-10-CM | POA: Diagnosis not present

## 2022-06-14 DIAGNOSIS — N189 Chronic kidney disease, unspecified: Secondary | ICD-10-CM | POA: Diagnosis not present

## 2022-06-14 DIAGNOSIS — E559 Vitamin D deficiency, unspecified: Secondary | ICD-10-CM | POA: Diagnosis not present

## 2022-06-20 DIAGNOSIS — E1122 Type 2 diabetes mellitus with diabetic chronic kidney disease: Secondary | ICD-10-CM | POA: Diagnosis not present

## 2022-06-20 DIAGNOSIS — E1129 Type 2 diabetes mellitus with other diabetic kidney complication: Secondary | ICD-10-CM | POA: Diagnosis not present

## 2022-06-20 DIAGNOSIS — R809 Proteinuria, unspecified: Secondary | ICD-10-CM | POA: Diagnosis not present

## 2022-06-20 DIAGNOSIS — N189 Chronic kidney disease, unspecified: Secondary | ICD-10-CM | POA: Diagnosis not present

## 2022-06-20 DIAGNOSIS — E559 Vitamin D deficiency, unspecified: Secondary | ICD-10-CM | POA: Diagnosis not present

## 2022-06-20 DIAGNOSIS — D638 Anemia in other chronic diseases classified elsewhere: Secondary | ICD-10-CM | POA: Diagnosis not present

## 2022-06-20 LAB — PROTEIN / CREATININE RATIO, URINE: Creatinine, Urine: 211

## 2022-06-26 ENCOUNTER — Ambulatory Visit (HOSPITAL_COMMUNITY)
Admission: RE | Admit: 2022-06-26 | Discharge: 2022-06-26 | Disposition: A | Discharge status: Home / Self Care | Payer: Medicare Other | Source: Ambulatory Visit | Attending: Nephrology | Admitting: Nephrology

## 2022-06-26 DIAGNOSIS — D638 Anemia in other chronic diseases classified elsewhere: Secondary | ICD-10-CM | POA: Diagnosis not present

## 2022-06-26 DIAGNOSIS — R809 Proteinuria, unspecified: Secondary | ICD-10-CM | POA: Diagnosis not present

## 2022-06-26 DIAGNOSIS — E1122 Type 2 diabetes mellitus with diabetic chronic kidney disease: Secondary | ICD-10-CM

## 2022-06-26 DIAGNOSIS — N189 Chronic kidney disease, unspecified: Secondary | ICD-10-CM | POA: Diagnosis not present

## 2022-06-28 DIAGNOSIS — M79672 Pain in left foot: Secondary | ICD-10-CM | POA: Diagnosis not present

## 2022-06-28 DIAGNOSIS — M79675 Pain in left toe(s): Secondary | ICD-10-CM | POA: Diagnosis not present

## 2022-06-28 DIAGNOSIS — M79671 Pain in right foot: Secondary | ICD-10-CM | POA: Diagnosis not present

## 2022-06-28 DIAGNOSIS — L110 Acquired keratosis follicularis: Secondary | ICD-10-CM | POA: Diagnosis not present

## 2022-06-28 DIAGNOSIS — E1140 Type 2 diabetes mellitus with diabetic neuropathy, unspecified: Secondary | ICD-10-CM | POA: Diagnosis not present

## 2022-06-28 DIAGNOSIS — M79674 Pain in right toe(s): Secondary | ICD-10-CM | POA: Diagnosis not present

## 2022-06-28 DIAGNOSIS — I739 Peripheral vascular disease, unspecified: Secondary | ICD-10-CM | POA: Diagnosis not present

## 2022-07-23 DIAGNOSIS — E1122 Type 2 diabetes mellitus with diabetic chronic kidney disease: Secondary | ICD-10-CM | POA: Diagnosis not present

## 2022-07-23 DIAGNOSIS — E1129 Type 2 diabetes mellitus with other diabetic kidney complication: Secondary | ICD-10-CM | POA: Diagnosis not present

## 2022-07-23 DIAGNOSIS — D638 Anemia in other chronic diseases classified elsewhere: Secondary | ICD-10-CM | POA: Diagnosis not present

## 2022-07-23 DIAGNOSIS — N1832 Chronic kidney disease, stage 3b: Secondary | ICD-10-CM | POA: Diagnosis not present

## 2022-07-25 ENCOUNTER — Encounter: Payer: Self-pay | Admitting: Family Medicine

## 2022-07-25 ENCOUNTER — Ambulatory Visit (INDEPENDENT_AMBULATORY_CARE_PROVIDER_SITE_OTHER): Payer: Medicare Other | Admitting: Family Medicine

## 2022-07-25 VITALS — BP 142/78 | HR 74 | Ht 60.0 in | Wt 185.1 lb

## 2022-07-25 DIAGNOSIS — B369 Superficial mycosis, unspecified: Secondary | ICD-10-CM

## 2022-07-25 DIAGNOSIS — R519 Headache, unspecified: Secondary | ICD-10-CM

## 2022-07-25 DIAGNOSIS — G35 Multiple sclerosis: Secondary | ICD-10-CM

## 2022-07-25 DIAGNOSIS — I10 Essential (primary) hypertension: Secondary | ICD-10-CM

## 2022-07-25 DIAGNOSIS — Z7984 Long term (current) use of oral hypoglycemic drugs: Secondary | ICD-10-CM | POA: Diagnosis not present

## 2022-07-25 DIAGNOSIS — E1149 Type 2 diabetes mellitus with other diabetic neurological complication: Secondary | ICD-10-CM | POA: Diagnosis not present

## 2022-07-25 MED ORDER — ALLOPURINOL 100 MG PO TABS: 100.0000 mg | ORAL_TABLET | Freq: Every day | ORAL | 3 refills | Status: DC

## 2022-07-25 MED ORDER — SPIRONOLACTONE 100 MG PO TABS: 100.0000 mg | ORAL_TABLET | Freq: Every day | ORAL | 3 refills | Status: DC

## 2022-07-25 MED ORDER — CARVEDILOL 3.125 MG PO TABS: 3.1250 mg | ORAL_TABLET | Freq: Two times a day (BID) | ORAL | 3 refills | Status: DC

## 2022-07-25 NOTE — Progress Notes (Signed)
Michaela Peterson     MRN: [id number]      DOB: 08-19-1955  Chief Complaint  Patient presents with   Follow-up    Follow up discuss kidney doctor visit    HPI Michaela Peterson is here for follow up and re-evaluation of chronic medical conditions, medication management and review of any available recent lab and radiology data.  Preventive health is updated, specifically  Cancer screening and Immunization.   Questions or concerns regarding consultations or procedures which the PT has had in the interim are  addressed.Reviewed recent visit with Nephrology, additional blood pressure med and also allopurinol, no other changes, no new dx 6 month h/o intermittent lancinating pain in left supraorbital area and also on back of head has MS and has no follow up States groin rash cleared but has returned despite usaing medication daily, also notes a cyst on " lip" since yesterday , non painful, will call if worsens, if not no change in management and examine at next visit idf persists. Denies pelvioc pain, has fibroid on [place] , no pMB ROS Denies recent fever or chills. Denies sinus pressure, nasal congestion, ear pain or sore throat. Denies chest congestion, productive cough or wheezing. Denies chest pains, palpitations and leg swelling Denies abdominal pain, nausea, vomiting,diarrhea or constipation.   Denies dysuria, frequency, hesitancy or incontinence. Denies joint pain, swelling and limitation in mobility. Denies headaches, seizures, numbness, or tingling. Denies depression, anxiety or insomnia.  PE  BP (!) 142/78   Pulse 74   Ht 5' (1.524 m)   Wt 185 lb 1.3 oz (84 kg)   LMP 08/18/2012   SpO2 97%   BMI 36.15 kg/m   Patient alert and oriented and in no cardiopulmonary distress.  HEENT: No facial asymmetry, EOMI,     Neck supple .  Chest: Clear to auscultation bilaterally.  CVS: S1, S2 no murmurs, no S3.Regular rate.  ABD: Soft non tender.   Ext: No edema  MS: Adequate ROM spine,  shoulders, hips and knees.  Skin: Intact, no ulcerations or rash noted.  Psych: Good eye contact, normal affect. Memory intact not anxious or depressed appearing.  CNS: CN 2-12 intact, power,  normal throughout.no focal deficits noted.   Assessment & Plan  Type 2 diabetes mellitus with neurological complications [facility]) Michaela Peterson is reminded of the importance of commitment to daily physical activity for 30 minutes or more, as able and the need to limit carbohydrate intake to 30 to 60 grams per meal to help with blood sugar control.   The need to take medication as prescribed, test blood sugar as directed, and to call between visits if there is a concern that blood sugar is uncontrolled is also discussed.   Michaela Peterson is reminded of the importance of daily foot exam, annual eye examination, and good blood sugar, blood pressure and cholesterol control.     Latest Ref Rng & Units 04/20/2022   11:07 AM 01/02/2022    1:02 PM 11/17/2021   10:09 AM 09/28/2021   10:48 AM 09/25/2021   10:29 AM  Diabetic Labs  HbA1c 4.8 - 5.6 % 7.7  7.2   7.4    Micro/Creat Ratio 0 - 29 mg/g creat   40     Chol 100 - 199 mg/dL 782     956   HDL >21 mg/dL 56     63   Calc LDL 0 - 99 mg/dL 75     60   Triglycerides 0 -  149 mg/dL 85     56   Creatinine 0.57 - 1.00 mg/dL 1.61  0.96    0.45       07/25/2022    1:16 PM 07/25/2022    9:44 AM 05/08/2022   10:46 AM 05/08/2022   10:14 AM 04/26/2022   11:05 AM 04/26/2022   10:27 AM 04/06/2022    1:36 PM  BP/Weight  Systolic BP 142 131 181 168 124 125 140  Diastolic BP 78 78 91 82 72 68 70  Wt. (Lbs)  185.08  180  182.12   BMI  36.15 kg/m2  35.15 kg/m2  35.57 kg/m2       Latest Ref Rng & Units 02/26/2022   12:00 AM 08/23/2021   10:00 AM  Foot/eye exam completion dates  Eye Exam No Retinopathy No Retinopathy       Foot Form Completion   Done     This result is from an external source.   Consider adding   another agentwill reach out to nephrology     MULTIPLE  SCLEROSIS Approx 6 week h/o new sporadic lancinating pains across left forehead and left posterior head. Needs scan and will update MS activity in brain also  New onset of headaches 6 week h/o intermittent lancinating pain on left head, needs MRI brain  Morbid obesity (HCC)  Patient re-educated about  the importance of commitment to a  minimum of 150 minutes of exercise per week as able.  The importance of healthy food choices with portion control discussed, as well as eating regularly and within a 12 hour window most days. The need to choose "clean , green" food 50 to 75% of the time is discussed, as well as to make water the primary drink and set a goal of 64 ounces water daily.       07/25/2022    9:44 AM 05/08/2022   10:14 AM 04/26/2022   10:27 AM  Weight /BMI  Weight 185 lb 1.3 oz 180 lb 182 lb 1.9 oz  Height 5' (1.524 m) 5' (1.524 m) 5' (1.524 m)  BMI 36.15 kg/m2 35.15 kg/m2 35.57 kg/m2      Hypertension goal BP (blood pressure) < 140/90 DASH diet and commitment to daily physical activity for a minimum of 30 minutes discussed and encouraged, as a part of hypertension management. The importance of attaining a healthy weight is also discussed.     07/25/2022    1:16 PM 07/25/2022    9:44 AM 05/08/2022   10:46 AM 05/08/2022   10:14 AM 04/26/2022   11:05 AM 04/26/2022   10:27 AM 04/06/2022    1:36 PM  BP/Weight  Systolic BP 142 131 181 168 124 125 140  Diastolic BP 78 78 91 82 72 68 70  Wt. (Lbs)  185.08  180  182.12   BMI  36.15 kg/m2  35.15 kg/m2  35.57 kg/m2      Controlled, no change in medication   Dermatomycosis Will re assess next visit as reports recurrence/ persistence

## 2022-07-25 NOTE — Assessment & Plan Note (Signed)
Approx 6 week h/o new sporadic lancinating pains across left forehead and left posterior head. Needs scan and will update MS activity in brain also

## 2022-07-25 NOTE — Patient Instructions (Signed)
F/u in 5 to 6 weeks re evaluate blood pressure  Please continue to bring all medications to every visit  Spironolactone is sent in new to mail order 100 mg once daily, you may take spironolactone 50 mg TWO tablets together every  day, till done  New additional BP med is carvedilol 3.125 mg one twice daily, take 12 hours apart please, example 8 am and 8 pm, thi is at your local pharmacy  I will get back to you re diabetes medication  I will refer you for bran scan, you will be contacted with  appointment info  It is important that you exercise regularly at least 30 minutes 5 times a week. If you develop chest pain, have severe difficulty breathing, or feel very tired, stop exercising immediately and seek medical attention   Thanks for choosing [facility], we consider it a privelige to serve you.

## 2022-07-25 NOTE — Assessment & Plan Note (Signed)
6 week h/o intermittent lancinating pain on left head, needs MRI brain

## 2022-07-25 NOTE — Assessment & Plan Note (Signed)
Will re assess next visit as reports recurrence/ persistence

## 2022-07-25 NOTE — Assessment & Plan Note (Signed)
DASH diet and commitment to daily physical activity for a minimum of 30 minutes discussed and encouraged, as a part of hypertension management. The importance of attaining a healthy weight is also discussed.     07/25/2022    1:16 PM 07/25/2022    9:44 AM 05/08/2022   10:46 AM 05/08/2022   10:14 AM 04/26/2022   11:05 AM 04/26/2022   10:27 AM 04/06/2022    1:36 PM  BP/Weight  Systolic BP 142 131 181 168 124 125 140  Diastolic BP 78 78 91 82 72 68 70  Wt. (Lbs)  185.08  180  182.12   BMI  36.15 kg/m2  35.15 kg/m2  35.57 kg/m2      Controlled, no change in medication

## 2022-07-25 NOTE — Assessment & Plan Note (Signed)
  Patient re-educated about  the importance of commitment to a  minimum of 150 minutes of exercise per week as able.  The importance of healthy food choices with portion control discussed, as well as eating regularly and within a 12 hour window most days. The need to choose "clean , green" food 50 to 75% of the time is discussed, as well as to make water the primary drink and set a goal of 64 ounces water daily.       07/25/2022    9:44 AM 05/08/2022   10:14 AM 04/26/2022   10:27 AM  Weight /BMI  Weight 185 lb 1.3 oz 180 lb 182 lb 1.9 oz  Height 5' (1.524 m) 5' (1.524 m) 5' (1.524 m)  BMI 36.15 kg/m2 35.15 kg/m2 35.57 kg/m2

## 2022-07-25 NOTE — Assessment & Plan Note (Signed)
Michaela Peterson is reminded of the importance of commitment to daily physical activity for 30 minutes or more, as able and the need to limit carbohydrate intake to 30 to 60 grams per meal to help with blood sugar control.   The need to take medication as prescribed, test blood sugar as directed, and to call between visits if there is a concern that blood sugar is uncontrolled is also discussed.   Michaela Peterson is reminded of the importance of daily foot exam, annual eye examination, and good blood sugar, blood pressure and cholesterol control.     Latest Ref Rng & Units 04/20/2022   11:07 AM 01/02/2022    1:02 PM 11/17/2021   10:09 AM 09/28/2021   10:48 AM 09/25/2021   10:29 AM  Diabetic Labs  HbA1c 4.8 - 5.6 % 7.7  7.2   7.4    Micro/Creat Ratio 0 - 29 mg/g creat   40     Chol 100 - 199 mg/dL 782     956   HDL >21 mg/dL 56     63   Calc LDL 0 - 99 mg/dL 75     60   Triglycerides 0 - 149 mg/dL 85     56   Creatinine 0.57 - 1.00 mg/dL 3.08  6.57    8.46       07/25/2022    1:16 PM 07/25/2022    9:44 AM 05/08/2022   10:46 AM 05/08/2022   10:14 AM 04/26/2022   11:05 AM 04/26/2022   10:27 AM 04/06/2022    1:36 PM  BP/Weight  Systolic BP 142 131 181 168 124 125 140  Diastolic BP 78 78 91 82 72 68 70  Wt. (Lbs)  185.08  180  182.12   BMI  36.15 kg/m2  35.15 kg/m2  35.57 kg/m2       Latest Ref Rng & Units 02/26/2022   12:00 AM 08/23/2021   10:00 AM  Foot/eye exam completion dates  Eye Exam No Retinopathy No Retinopathy       Foot Form Completion   Done     This result is from an external source.   Consider adding   another agentwill reach out to nephrology

## 2022-07-30 LAB — HM DIABETES EYE EXAM

## 2022-08-08 ENCOUNTER — Other Ambulatory Visit: Payer: Self-pay | Admitting: Family Medicine

## 2022-08-08 DIAGNOSIS — J309 Allergic rhinitis, unspecified: Secondary | ICD-10-CM

## 2022-08-13 ENCOUNTER — Other Ambulatory Visit: Payer: Self-pay | Admitting: Family Medicine

## 2022-08-24 ENCOUNTER — Ambulatory Visit (HOSPITAL_COMMUNITY)
Admission: RE | Admit: 2022-08-24 | Discharge: 2022-08-24 | Disposition: A | Discharge status: Home / Self Care | Payer: Medicare Other | Source: Ambulatory Visit | Attending: Family Medicine | Admitting: Family Medicine

## 2022-08-24 DIAGNOSIS — G35 Multiple sclerosis: Secondary | ICD-10-CM | POA: Diagnosis not present

## 2022-08-24 DIAGNOSIS — R519 Headache, unspecified: Secondary | ICD-10-CM

## 2022-08-27 ENCOUNTER — Ambulatory Visit (HOSPITAL_COMMUNITY): Admission: RE | Admit: 2022-08-27 | Payer: Medicare Other | Source: Ambulatory Visit

## 2022-09-21 ENCOUNTER — Ambulatory Visit: Payer: Medicare Other | Admitting: Family Medicine

## 2022-09-27 DIAGNOSIS — M79674 Pain in right toe(s): Secondary | ICD-10-CM | POA: Diagnosis not present

## 2022-09-27 DIAGNOSIS — I739 Peripheral vascular disease, unspecified: Secondary | ICD-10-CM | POA: Diagnosis not present

## 2022-09-27 DIAGNOSIS — M79675 Pain in left toe(s): Secondary | ICD-10-CM | POA: Diagnosis not present

## 2022-09-27 DIAGNOSIS — E1140 Type 2 diabetes mellitus with diabetic neuropathy, unspecified: Secondary | ICD-10-CM | POA: Diagnosis not present

## 2022-09-27 DIAGNOSIS — L110 Acquired keratosis follicularis: Secondary | ICD-10-CM | POA: Diagnosis not present

## 2022-09-27 DIAGNOSIS — M79672 Pain in left foot: Secondary | ICD-10-CM | POA: Diagnosis not present

## 2022-09-27 DIAGNOSIS — M79671 Pain in right foot: Secondary | ICD-10-CM | POA: Diagnosis not present

## 2022-09-28 ENCOUNTER — Ambulatory Visit (INDEPENDENT_AMBULATORY_CARE_PROVIDER_SITE_OTHER): Payer: Medicare Other | Admitting: Family Medicine

## 2022-09-28 ENCOUNTER — Encounter: Payer: Self-pay | Admitting: Family Medicine

## 2022-09-28 VITALS — BP 133/74 | HR 67 | Ht 60.0 in | Wt 188.1 lb

## 2022-09-28 DIAGNOSIS — I10 Essential (primary) hypertension: Secondary | ICD-10-CM | POA: Diagnosis not present

## 2022-09-28 DIAGNOSIS — J309 Allergic rhinitis, unspecified: Secondary | ICD-10-CM

## 2022-09-28 DIAGNOSIS — E1149 Type 2 diabetes mellitus with other diabetic neurological complication: Secondary | ICD-10-CM | POA: Diagnosis not present

## 2022-09-28 DIAGNOSIS — E559 Vitamin D deficiency, unspecified: Secondary | ICD-10-CM | POA: Diagnosis not present

## 2022-09-28 DIAGNOSIS — G35 Multiple sclerosis: Secondary | ICD-10-CM

## 2022-09-28 DIAGNOSIS — E785 Hyperlipidemia, unspecified: Secondary | ICD-10-CM | POA: Diagnosis not present

## 2022-09-28 DIAGNOSIS — M109 Gout, unspecified: Secondary | ICD-10-CM

## 2022-09-28 LAB — POCT GLYCOSYLATED HEMOGLOBIN (HGB A1C): HbA1c, POC (controlled diabetic range): 6.8 % (ref 0.0–7.0)

## 2022-09-28 NOTE — Progress Notes (Unsigned)
Michaela Peterson     MRN: [id number]      DOB: September 23, 1955  Chief Complaint  Patient presents with   Follow-up    HPI Michaela Peterson is here for follow up and re-evaluation of chronic medical conditions, medication management and review of any available recent lab and radiology data.  Preventive health is updated, specifically  Cancer screening and Immunization.   Questions or concerns regarding consultations or procedures which the PT has had in the interim are  addressed. The PT denies any adverse reactions to current medications since the last visit.  There are no new concerns.  There are no specific complaints   ROS Denies recent fever or chills. Denies sinus pressure, nasal congestion, ear pain or sore throat. Denies chest congestion, productive cough or wheezing. Denies chest pains, palpitations and leg swelling Denies abdominal pain, nausea, vomiting,diarrhea or constipation.   Denies dysuria, frequency, hesitancy or incontinence. Denies joint pain, swelling and limitation in mobility. Denies headaches, seizures, numbness, or tingling. Denies depression, anxiety or insomnia. Denies skin break down or rash.   PE  BP 133/74 (BP Location: Right Arm, Patient Position: Sitting, Cuff Size: Large)   Pulse 67   Ht 5' (1.524 m)   Wt 188 lb 1.3 oz (85.3 kg)   LMP 08/18/2012   SpO2 94%   BMI 36.73 kg/m   Patient alert and oriented and in no cardiopulmonary distress.  HEENT: No facial asymmetry, EOMI,     Neck supple .  Chest: Clear to auscultation bilaterally.  CVS: S1, S2 no murmurs, no S3.Regular rate.  ABD: Soft non tender.   Ext: No edema  MS: Adequate ROM spine, shoulders, hips and knees.  Skin: Intact, no ulcerations or rash noted.  Psych: Good eye contact, normal affect. Memory intact not anxious or depressed appearing.  CNS: CN 2-12 intact, power,  normal throughout.no focal deficits noted.   Assessment & Plan  Type 2 diabetes mellitus with neurological  complications (HCC) Improved and controlled which is excellent Michaela Peterson is reminded of the importance of commitment to daily physical activity for 30 minutes or more, as able and the need to limit carbohydrate intake to 30 to 60 grams per meal to help with blood sugar control.   The need to take medication as prescribed, test blood sugar as directed, and to call between visits if there is a concern that blood sugar is uncontrolled is also discussed.   Michaela Peterson is reminded of the importance of daily foot exam, annual eye examination, and good blood sugar, blood pressure and cholesterol control.     Latest Ref Rng & Units 09/28/2022   10:53 AM 04/20/2022   11:07 AM 01/02/2022    1:02 PM 11/17/2021   10:09 AM 09/28/2021   10:48 AM  Diabetic Labs  HbA1c 0.0 - 7.0 % 6.8  7.7  7.2   7.4   Micro/Creat Ratio 0 - 29 mg/g creat    40    Chol 100 - 199 mg/dL  010      HDL >27 mg/dL  56      Calc LDL 0 - 99 mg/dL  75      Triglycerides 0 - 149 mg/dL  85      Creatinine 2.53 - 1.00 mg/dL  6.64  4.03         4/74/2595   10:18 AM 07/25/2022    1:16 PM 07/25/2022    9:44 AM 05/08/2022   10:46 AM 05/08/2022   10:14  AM 04/26/2022   11:05 AM 04/26/2022   10:27 AM  BP/Weight  Systolic BP 133 142 131 181 168 124 125  Diastolic BP 74 78 78 91 82 72 68  Wt. (Lbs) 188.08  185.08  180  182.12  BMI 36.73 kg/m2  36.15 kg/m2  35.15 kg/m2  35.57 kg/m2      Latest Ref Rng & Units 07/30/2022   12:00 AM 02/26/2022   12:00 AM  Foot/eye exam completion dates  Eye Exam No Retinopathy No Retinopathy     No Retinopathy         This result is from an external source.        Hypertension goal BP (blood pressure) < 140/90 Controlled, no change in medication DASH diet and commitment to daily physical activity for a minimum of 30 minutes discussed and encouraged, as a part of hypertension management. The importance of attaining a healthy weight is also discussed.     09/28/2022   10:18 AM 07/25/2022    1:16 PM  07/25/2022    9:44 AM 05/08/2022   10:46 AM 05/08/2022   10:14 AM 04/26/2022   11:05 AM 04/26/2022   10:27 AM  BP/Weight  Systolic BP 133 142 131 181 168 124 125  Diastolic BP 74 78 78 91 82 72 68  Wt. (Lbs) 188.08  185.08  180  182.12  BMI 36.73 kg/m2  36.15 kg/m2  35.15 kg/m2  35.57 kg/m2       MULTIPLE SCLEROSIS No changed noted over a 13 year period as far as disease progeression is concerned, recent scan 2024  Allergic rhinitis Controlled, no change in medication   Hyperlipidemia LDL goal <100 Hyperlipidemia:Low fat diet discussed and encouraged.   Lipid Panel  Lab Results  Component Value Date   CHOL 147 04/20/2022   HDL 56 04/20/2022   LDLCALC 75 04/20/2022   TRIG 85 04/20/2022   CHOLHDL 2.6 04/20/2022     Controlled, no change in medication   Morbid obesity (HCC)  Patient re-educated about  the importance of commitment to a  minimum of 150 minutes of exercise per week as able.  The importance of healthy food choices with portion control discussed, as well as eating regularly and within a 12 hour window most days. The need to choose "clean , green" food 50 to 75% of the time is discussed, as well as to make water the primary drink and set a goal of 64 ounces water daily.       09/28/2022   10:18 AM 07/25/2022    9:44 AM 05/08/2022   10:14 AM  Weight /BMI  Weight 188 lb 1.3 oz 185 lb 1.3 oz 180 lb  Height 5' (1.524 m) 5' (1.524 m) 5' (1.524 m)  BMI 36.73 kg/m2 36.15 kg/m2 35.15 kg/m2    unchanged

## 2022-09-28 NOTE — Patient Instructions (Addendum)
Annual exam 10/27 or after , call if you need me sooner  CONGRATS!!, no changes in medication  Keep testing every day and change food choice so you keep getting better! Blood sugar at 6.8, wonderful  Good foot exam  Please get fasting lipid, cmp and EGFr, HBa1C ,uric acid level, TSH and vit D 3 to 5 days before next appt  Nurse please retrieve and enter shingrix and covid vaccines recently obtained (around Aprilk ) [facility]  It is important that you exercise regularly at least 30 minutes 5 times a week. If you develop chest pain, have severe difficulty breathing, or feel very tired, stop exercising immediately and seek medical attention  Thanks for choosing [facility], we consider it a privelige to serve you.

## 2022-10-01 ENCOUNTER — Encounter: Payer: Self-pay | Admitting: Family Medicine

## 2022-10-01 NOTE — Assessment & Plan Note (Signed)
Controlled, no change in medication DASH diet and commitment to daily physical activity for a minimum of 30 minutes discussed and encouraged, as a part of hypertension management. The importance of attaining a healthy weight is also discussed.     09/28/2022   10:18 AM 07/25/2022    1:16 PM 07/25/2022    9:44 AM 05/08/2022   10:46 AM 05/08/2022   10:14 AM 04/26/2022   11:05 AM 04/26/2022   10:27 AM  BP/Weight  Systolic BP 133 142 131 181 168 124 125  Diastolic BP 74 78 78 91 82 72 68  Wt. (Lbs) 188.08  185.08  180  182.12  BMI 36.73 kg/m2  36.15 kg/m2  35.15 kg/m2  35.57 kg/m2

## 2022-10-01 NOTE — Assessment & Plan Note (Signed)
Improved and controlled which is excellent Michaela Peterson is reminded of the importance of commitment to daily physical activity for 30 minutes or more, as able and the need to limit carbohydrate intake to 30 to 60 grams per meal to help with blood sugar control.   The need to take medication as prescribed, test blood sugar as directed, and to call between visits if there is a concern that blood sugar is uncontrolled is also discussed.   Michaela Peterson is reminded of the importance of daily foot exam, annual eye examination, and good blood sugar, blood pressure and cholesterol control.     Latest Ref Rng & Units 09/28/2022   10:53 AM 04/20/2022   11:07 AM 01/02/2022    1:02 PM 11/17/2021   10:09 AM 09/28/2021   10:48 AM  Diabetic Labs  HbA1c 0.0 - 7.0 % 6.8  7.7  7.2   7.4   Micro/Creat Ratio 0 - 29 mg/g creat    40    Chol 100 - 199 mg/dL  427      HDL >06 mg/dL  56      Calc LDL 0 - 99 mg/dL  75      Triglycerides 0 - 149 mg/dL  85      Creatinine 2.37 - 1.00 mg/dL  6.28  3.15         1/76/1607   10:18 AM 07/25/2022    1:16 PM 07/25/2022    9:44 AM 05/08/2022   10:46 AM 05/08/2022   10:14 AM 04/26/2022   11:05 AM 04/26/2022   10:27 AM  BP/Weight  Systolic BP 133 142 131 181 168 124 125  Diastolic BP 74 78 78 91 82 72 68  Wt. (Lbs) 188.08  185.08  180  182.12  BMI 36.73 kg/m2  36.15 kg/m2  35.15 kg/m2  35.57 kg/m2      Latest Ref Rng & Units 07/30/2022   12:00 AM 02/26/2022   12:00 AM  Foot/eye exam completion dates  Eye Exam No Retinopathy No Retinopathy     No Retinopathy         This result is from an external source.

## 2022-10-01 NOTE — Assessment & Plan Note (Signed)
Hyperlipidemia:Low fat diet discussed and encouraged.   Lipid Panel  Lab Results  Component Value Date   CHOL 147 04/20/2022   HDL 56 04/20/2022   LDLCALC 75 04/20/2022   TRIG 85 04/20/2022   CHOLHDL 2.6 04/20/2022     Controlled, no change in medication

## 2022-10-01 NOTE — Assessment & Plan Note (Signed)
No changed noted over a 13 year period as far as disease progeression is concerned, recent scan 2024

## 2022-10-01 NOTE — Assessment & Plan Note (Signed)
Controlled, no change in medication  

## 2022-10-01 NOTE — Assessment & Plan Note (Signed)
  Patient re-educated about  the importance of commitment to a  minimum of 150 minutes of exercise per week as able.  The importance of healthy food choices with portion control discussed, as well as eating regularly and within a 12 hour window most days. The need to choose "clean , green" food 50 to 75% of the time is discussed, as well as to make water the primary drink and set a goal of 64 ounces water daily.       09/28/2022   10:18 AM 07/25/2022    9:44 AM 05/08/2022   10:14 AM  Weight /BMI  Weight 188 lb 1.3 oz 185 lb 1.3 oz 180 lb  Height 5' (1.524 m) 5' (1.524 m) 5' (1.524 m)  BMI 36.73 kg/m2 36.15 kg/m2 35.15 kg/m2    unchanged

## 2022-10-04 ENCOUNTER — Ambulatory Visit (INDEPENDENT_AMBULATORY_CARE_PROVIDER_SITE_OTHER): Payer: Medicare Other | Admitting: Family Medicine

## 2022-10-04 ENCOUNTER — Encounter: Payer: Self-pay | Admitting: Family Medicine

## 2022-10-04 VITALS — BP 148/86 | HR 67 | Ht 60.0 in | Wt 186.0 lb

## 2022-10-04 DIAGNOSIS — J029 Acute pharyngitis, unspecified: Secondary | ICD-10-CM

## 2022-10-04 DIAGNOSIS — J069 Acute upper respiratory infection, unspecified: Secondary | ICD-10-CM | POA: Insufficient documentation

## 2022-10-04 LAB — POCT RAPID STREP A (OFFICE): Rapid Strep A Screen: NEGATIVE

## 2022-10-04 MED ORDER — PREDNISONE 10 MG PO TABS: 10.0000 mg | ORAL_TABLET | Freq: Every day | ORAL | 0 refills | Status: AC

## 2022-10-04 MED ORDER — AZITHROMYCIN 250 MG PO TABS
ORAL_TABLET | ORAL | 0 refills | Status: DC
Start: 2022-10-04 — End: 2023-01-15

## 2022-10-04 MED ORDER — BENZONATATE 100 MG PO CAPS: 100.0000 mg | ORAL_CAPSULE | Freq: Two times a day (BID) | ORAL | 0 refills | Status: DC | PRN

## 2022-10-04 NOTE — Assessment & Plan Note (Addendum)
-   Strep throat test negative  Azithromycin 250 mg x 5 days, Benzonatate 100 mg PRN, Prednisone 20 mg x 5 days. Discuss symptomatic treatment, rest, increase oral fluid intake. Take OTC tylenol for fever or pain Follow-up for worsening or persistent symptoms. Patient verbalizes understanding regarding plan of care and all questions answered

## 2022-10-04 NOTE — Progress Notes (Signed)
Patient Office Visit   Subjective   Patient ID: Michaela Peterson, female    DOB: [birth year]  Age: 67 y.o. MRN: [id number]  CC:  Chief Complaint  Patient presents with   Sore Throat    Pt reports sore throat and cough ongoing  09/19/22 off and on.    HPI Michaela W Brown67 year old female, presents to the clinic for sore throat and cough started 2 weeks ago and gradually worsening.She  has a past medical history of Adnexal mass, Anemia, iron deficiency, Arthritis, Chronic low back pain, Diabetes mellitus, type 2 (HCC), Essential hypertension, Gastroparesis, GERD (gastroesophageal reflux disease), MS (multiple sclerosis) (HCC), Nonalcoholic fatty liver disease, Obesity, and Sleep apnea.  Patient complains of sore throat. Patient describes symptoms of shortness of breath on exertion, cough, fatigue, malaise, nausea, sore throat, and sputum production. Symptoms began a few weeks ago and are gradually worsening since that time. Patient denies chest pain, nausea and vomiting, or history of previous pneumonias. Treatment thus far includes OTC analgesics/antipyretics: not very effective Past pulmonary history is significant for occasional episodes of bronchitis and pneumonia.      Outpatient Encounter Medications as of 10/04/2022  Medication Sig   allopurinol (ZYLOPRIM) 100 MG tablet Take 1 tablet (100 mg total) by mouth daily.   aspirin EC 81 MG tablet Take 81 mg by mouth daily.   azelastine (ASTELIN) 0.1 % nasal spray Place 2 sprays into both nostrils 2 (two) times daily. Use in each nostril as directed   azithromycin (ZITHROMAX) 250 MG tablet Take 2 tablets on day 1, then 1 tablet daily on days 2 through 5   benzonatate (TESSALON) 100 MG capsule Take 1 capsule (100 mg total) by mouth 2 (two) times daily as needed for cough.   Blood Glucose Monitoring Suppl (ONE TOUCH ULTRA 2) w/Device KIT USE TO TEST ONCE DAILY   carvedilol (COREG) 3.125 MG tablet Take 1 tablet (3.125 mg total) by mouth 2 (two)  times daily with a meal.   cetirizine (ZYRTEC) 10 MG tablet Take 1 tablet (10 mg total) by mouth daily.   Cholecalciferol (VITAMIN D3) 50 MCG (2000 UT) TABS Take 2,000 Units by mouth daily.   cholestyramine (QUESTRAN) 4 g packet Take 1 packet (4 g total) by mouth daily.   clotrimazole-betamethasone (LOTRISONE) cream Apply 1 Application topically 2 (two) times daily.   diclofenac Sodium (VOLTAREN) 1 % GEL Apply topically 4 (four) times daily.   fluorometholone (FML) 0.1 % ophthalmic suspension Place 1 drop into both eyes 3 (three) times daily.   fluticasone (FLONASE) 50 MCG/ACT nasal spray USE 2 SPRAYS IN BOTH NOSTRILS  DAILY   glipiZIDE (GLUCOTROL) 10 MG tablet TAKE 1 TABLET BY MOUTH TWICE  DAILY BEFORE MEALS   ibuprofen (ADVIL) 800 MG tablet Take 800 mg by mouth every 8 (eight) hours as needed.   Lancets (ONETOUCH DELICA PLUS LANCET33G) MISC Test once daily DX e11.9   Lancets (ONETOUCH ULTRASOFT) lancets Use as instructed   Magnesium 400 MG CAPS Take by mouth.   metFORMIN (GLUCOPHAGE) 1000 MG tablet TAKE 1 TABLET BY MOUTH TWICE  DAILY WITH MEALS   montelukast (SINGULAIR) 10 MG tablet TAKE 1 TABLET BY MOUTH AT  BEDTIME   NIFEdipine (ADALAT CC) 90 MG 24 hr tablet TAKE 1 TABLET BY MOUTH DAILY   olmesartan (BENICAR) 40 MG tablet TAKE 1 TABLET BY MOUTH DAILY   ONETOUCH ULTRA test strip USE TO TEST ONCE DAILY   pantoprazole (PROTONIX) 40 MG tablet TAKE 1 TABLET  BY MOUTH DAILY  BEFORE BREAKFAST   predniSONE (DELTASONE) 10 MG tablet Take 1 tablet (10 mg total) by mouth daily with breakfast for 5 days.   RESTASIS 0.05 % ophthalmic emulsion 1 drop 2 (two) times daily.   rosuvastatin (CRESTOR) 5 MG tablet TAKE ONE TABLET BY MOUTH EVERY MONDAY AND FRIDAY   spironolactone (ALDACTONE) 100 MG tablet Take 1 tablet (100 mg total) by mouth daily.   triamcinolone cream (KENALOG) 0.1 % Apply 1 application topically daily as needed (rash).   No facility-administered encounter medications on file as of  10/04/2022.    Past Surgical History:  Procedure Laterality Date   AGILE CAPSULE N/A 08/11/2014   Procedure: AGILE CAPSULE;  Surgeon: Corbin Ade, MD;  Location: [facility];  Service: Endoscopy;  Laterality: N/A;  0700   BALLOON DILATION N/A 03/07/2021   Procedure: BALLOON DILATION;  Surgeon: Lanelle Bal, DO;  Location: [facility];  Service: Endoscopy;  Laterality: N/A;   BIOPSY  03/07/2021   Procedure: BIOPSY;  Surgeon: Lanelle Bal, DO;  Location: [facility];  Service: Endoscopy;;   BLADDER SUSPENSION     CESAREAN SECTION     x2    CHOLECYSTECTOMY     COLONOSCOPY  2010   Dr. Jena Gauss: tubular adenoma, few scattered diverticula   COLONOSCOPY N/A 10/01/2013   Dr. Thurston Pounds preparation. Normal rectum. Normal colonic mucosa   COLONOSCOPY N/A 11/12/2018   sigmoid and descending colon diverticulosis. Two 4-5 mm polyps in ascending colon. (tubular adenomas). Surveillance in 5 years   DILATION AND CURETTAGE OF UTERUS     4   ESOPHAGOGASTRODUODENOSCOPY  2010   Dr. Jena Gauss: normal esophagus, small hiatal hernia, questionable pale duodenal mucosa but negative for celiac sprue.    ESOPHAGOGASTRODUODENOSCOPY N/A 10/01/2013   Dr. Rourk:gastric polyps-status post biopsy. Otherwise, normal EGD. fundic gland polyp, negative H.pylori   ESOPHAGOGASTRODUODENOSCOPY (EGD) WITH PROPOFOL N/A 03/07/2021   Procedure: ESOPHAGOGASTRODUODENOSCOPY (EGD) WITH PROPOFOL;  Surgeon: Lanelle Bal, DO;  Location: [facility];  Service: Endoscopy;  Laterality: N/A;  12:00pm   GIVENS CAPSULE STUDY N/A 09/08/2014   Poor prep but overall unremarkable    KNEE ARTHROSCOPY W/ DEBRIDEMENT     LAPAROSCOPY ABDOMEN DIAGNOSTIC     lysis of adhesions   POLYPECTOMY  11/12/2018   Procedure: POLYPECTOMY;  Surgeon: Corbin Ade, MD;  Location: [facility];  Service: Endoscopy;;   ROTATOR CUFF REPAIR     TENDON LENGTHENING     Left wrist   UMBILICAL HERNIA REPAIR      Review of Systems   Constitutional:  Negative for chills and fever.  HENT:  Positive for congestion and sore throat.   Eyes:  Negative for blurred vision.  Respiratory:  Positive for cough, sputum production and shortness of breath. Negative for hemoptysis and wheezing.   Cardiovascular:  Negative for chest pain.  Gastrointestinal:  Negative for abdominal pain.      Objective    BP (!) 148/86 (BP Location: Left Arm)   Pulse 67   Ht 5' (1.524 m)   Wt 186 lb (84.4 kg)   LMP 08/18/2012   SpO2 98%   BMI 36.33 kg/m   Physical Exam Vitals reviewed.  Constitutional:      General: She is not in acute distress.    Appearance: Normal appearance. She is not ill-appearing, toxic-appearing or diaphoretic.  HENT:     Head: Normocephalic.     Right Ear: Tympanic membrane normal.  Left Ear: Tympanic membrane normal.     Nose: Congestion and rhinorrhea present.     Mouth/Throat:     Pharynx: Posterior oropharyngeal erythema present.  Eyes:     General:        Right eye: No discharge.        Left eye: No discharge.     Conjunctiva/sclera: Conjunctivae normal.  Cardiovascular:     Rate and Rhythm: Normal rate.     Pulses: Normal pulses.     Heart sounds: Normal heart sounds.  Pulmonary:     Effort: Pulmonary effort is normal. No respiratory distress.  Musculoskeletal:        General: Normal range of motion.     Cervical back: Normal range of motion.  Skin:    General: Skin is warm and dry.     Capillary Refill: Capillary refill takes less than 2 seconds.  Neurological:     General: No focal deficit present.     Mental Status: She is alert and oriented to person, place, and time.     Coordination: Coordination normal.     Gait: Gait normal.  Psychiatric:        Mood and Affect: Mood normal.        Behavior: Behavior normal.        Thought Content: Thought content normal.       Assessment & Plan:  Upper respiratory tract infection, unspecified type Assessment & Plan: - Strep throat test  negative  Azithromycin 250 mg x 5 days, Benzonatate 100 mg PRN, Prednisone 20 mg x 5 days. Discuss symptomatic treatment, rest, increase oral fluid intake. Take OTC tylenol for fever or pain Follow-up for worsening or persistent symptoms. Patient verbalizes understanding regarding plan of care and all questions answered   Orders: -     Azithromycin; Take 2 tablets on day 1, then 1 tablet daily on days 2 through 5  Dispense: 6 tablet; Refill: 0 -     predniSONE; Take 1 tablet (10 mg total) by mouth daily with breakfast for 5 days.  Dispense: 5 tablet; Refill: 0  Sore throat -     POCT rapid strep A  Other orders -     Benzonatate; Take 1 capsule (100 mg total) by mouth 2 (two) times daily as needed for cough.  Dispense: 20 capsule; Refill: 0    Return if symptoms worsen or fail to improve.   Cruzita Lederer Newman Nip, FNP

## 2022-10-04 NOTE — Patient Instructions (Signed)
        Great to see you today.   - Please take medications as prescribed. - Follow up with your primary health provider if any health concerns arises. - If symptoms worsen please contact your primary care provider and/or visit the emergency department.  

## 2022-10-12 DIAGNOSIS — D638 Anemia in other chronic diseases classified elsewhere: Secondary | ICD-10-CM | POA: Diagnosis not present

## 2022-10-12 DIAGNOSIS — D509 Iron deficiency anemia, unspecified: Secondary | ICD-10-CM | POA: Diagnosis not present

## 2022-10-12 DIAGNOSIS — N1832 Chronic kidney disease, stage 3b: Secondary | ICD-10-CM | POA: Diagnosis not present

## 2022-10-12 DIAGNOSIS — E1122 Type 2 diabetes mellitus with diabetic chronic kidney disease: Secondary | ICD-10-CM | POA: Diagnosis not present

## 2022-10-12 DIAGNOSIS — N189 Chronic kidney disease, unspecified: Secondary | ICD-10-CM | POA: Diagnosis not present

## 2022-10-17 ENCOUNTER — Ambulatory Visit (HOSPITAL_COMMUNITY)
Admission: RE | Admit: 2022-10-17 | Discharge: 2022-10-17 | Disposition: A | Discharge status: Home / Self Care | Payer: Medicare Other | Source: Ambulatory Visit | Attending: Family Medicine | Admitting: Family Medicine

## 2022-10-17 DIAGNOSIS — Z1231 Encounter for screening mammogram for malignant neoplasm of breast: Secondary | ICD-10-CM | POA: Diagnosis not present

## 2022-11-16 DIAGNOSIS — N1832 Chronic kidney disease, stage 3b: Secondary | ICD-10-CM | POA: Diagnosis not present

## 2022-11-16 DIAGNOSIS — E1122 Type 2 diabetes mellitus with diabetic chronic kidney disease: Secondary | ICD-10-CM | POA: Diagnosis not present

## 2022-11-16 DIAGNOSIS — D638 Anemia in other chronic diseases classified elsewhere: Secondary | ICD-10-CM | POA: Diagnosis not present

## 2022-11-16 DIAGNOSIS — E1129 Type 2 diabetes mellitus with other diabetic kidney complication: Secondary | ICD-10-CM | POA: Diagnosis not present

## 2022-12-13 ENCOUNTER — Other Ambulatory Visit: Payer: Self-pay | Admitting: Family Medicine

## 2022-12-18 ENCOUNTER — Telehealth: Payer: Self-pay | Admitting: Family Medicine

## 2022-12-18 NOTE — Telephone Encounter (Signed)
Patient called in requesting a call back.  States that she had discussed with provider bout leg/ back pain. Now pain is worsening and waking patient up out of sleep. Patient wants a call back to see what she can do next

## 2022-12-26 NOTE — Telephone Encounter (Signed)
Number [id number]

## 2022-12-27 DIAGNOSIS — L110 Acquired keratosis follicularis: Secondary | ICD-10-CM | POA: Diagnosis not present

## 2022-12-27 DIAGNOSIS — I739 Peripheral vascular disease, unspecified: Secondary | ICD-10-CM | POA: Diagnosis not present

## 2022-12-27 DIAGNOSIS — E1140 Type 2 diabetes mellitus with diabetic neuropathy, unspecified: Secondary | ICD-10-CM | POA: Diagnosis not present

## 2022-12-27 DIAGNOSIS — M79675 Pain in left toe(s): Secondary | ICD-10-CM | POA: Diagnosis not present

## 2022-12-27 DIAGNOSIS — M79672 Pain in left foot: Secondary | ICD-10-CM | POA: Diagnosis not present

## 2022-12-27 DIAGNOSIS — M79671 Pain in right foot: Secondary | ICD-10-CM | POA: Diagnosis not present

## 2022-12-27 DIAGNOSIS — M79674 Pain in right toe(s): Secondary | ICD-10-CM | POA: Diagnosis not present

## 2023-01-02 ENCOUNTER — Ambulatory Visit (INDEPENDENT_AMBULATORY_CARE_PROVIDER_SITE_OTHER): Payer: Medicare Other

## 2023-01-02 DIAGNOSIS — E1149 Type 2 diabetes mellitus with other diabetic neurological complication: Secondary | ICD-10-CM | POA: Diagnosis not present

## 2023-01-02 DIAGNOSIS — I10 Essential (primary) hypertension: Secondary | ICD-10-CM | POA: Diagnosis not present

## 2023-01-02 DIAGNOSIS — Z23 Encounter for immunization: Secondary | ICD-10-CM | POA: Diagnosis not present

## 2023-01-02 DIAGNOSIS — E559 Vitamin D deficiency, unspecified: Secondary | ICD-10-CM | POA: Diagnosis not present

## 2023-01-02 DIAGNOSIS — E785 Hyperlipidemia, unspecified: Secondary | ICD-10-CM | POA: Diagnosis not present

## 2023-01-02 DIAGNOSIS — M109 Gout, unspecified: Secondary | ICD-10-CM | POA: Diagnosis not present

## 2023-01-03 LAB — CMP14+EGFR
ALT: 21 [IU]/L (ref 0–32)
AST: 20 [IU]/L (ref 0–40)
Albumin: 4.5 g/dL (ref 3.9–4.9)
Alkaline Phosphatase: 106 [IU]/L (ref 44–121)
BUN/Creatinine Ratio: 16 (ref 12–28)
BUN: 18 mg/dL (ref 8–27)
Bilirubin Total: 0.3 mg/dL (ref 0.0–1.2)
CO2: 18 mmol/L — ABNORMAL LOW (ref 20–29)
Calcium: 9.7 mg/dL (ref 8.7–10.3)
Chloride: 106 mmol/L (ref 96–106)
Creatinine, Ser: 1.12 mg/dL — ABNORMAL HIGH (ref 0.57–1.00)
Globulin, Total: 2.7 g/dL (ref 1.5–4.5)
Glucose: 114 mg/dL — ABNORMAL HIGH (ref 70–99)
Potassium: 4.5 mmol/L (ref 3.5–5.2)
Sodium: 140 mmol/L (ref 134–144)
Total Protein: 7.2 g/dL (ref 6.0–8.5)
eGFR: 54 mL/min/{1.73_m2} — ABNORMAL LOW (ref >59)

## 2023-01-03 LAB — TSH: TSH: 3.67 u[IU]/mL (ref 0.450–4.500)

## 2023-01-03 LAB — LIPID PANEL
Chol/HDL Ratio: 2.2 {ratio} (ref 0.0–4.4)
Cholesterol, Total: 130 mg/dL (ref 100–199)
HDL: 58 mg/dL (ref >39)
LDL Chol Calc (NIH): 57 mg/dL (ref 0–99)
Triglycerides: 73 mg/dL (ref 0–149)
VLDL Cholesterol Cal: 15 mg/dL (ref 5–40)

## 2023-01-03 LAB — HEMOGLOBIN A1C
Est. average glucose Bld gHb Est-mCnc: 180 mg/dL
Hgb A1c MFr Bld: 7.9 % — ABNORMAL HIGH (ref 4.8–5.6)

## 2023-01-03 LAB — VITAMIN D 25 HYDROXY (VIT D DEFICIENCY, FRACTURES): Vit D, 25-Hydroxy: 43.4 ng/mL (ref 30.0–100.0)

## 2023-01-03 LAB — URIC ACID: Uric Acid: 7.3 mg/dL — ABNORMAL HIGH (ref 3.0–7.2)

## 2023-01-09 ENCOUNTER — Ambulatory Visit: Payer: Medicare Other | Admitting: Family Medicine

## 2023-01-15 ENCOUNTER — Ambulatory Visit: Payer: Medicare Other | Admitting: Family Medicine

## 2023-01-15 ENCOUNTER — Encounter: Payer: Self-pay | Admitting: Family Medicine

## 2023-01-15 VITALS — BP 122/62 | HR 83 | Resp 16 | Ht 60.0 in | Wt 187.0 lb

## 2023-01-15 DIAGNOSIS — E1149 Type 2 diabetes mellitus with other diabetic neurological complication: Secondary | ICD-10-CM

## 2023-01-15 DIAGNOSIS — M549 Dorsalgia, unspecified: Secondary | ICD-10-CM

## 2023-01-15 DIAGNOSIS — I10 Essential (primary) hypertension: Secondary | ICD-10-CM

## 2023-01-15 DIAGNOSIS — E785 Hyperlipidemia, unspecified: Secondary | ICD-10-CM | POA: Diagnosis not present

## 2023-01-15 MED ORDER — SEMAGLUTIDE(0.25 OR 0.5MG/DOS) 2 MG/3ML ~~LOC~~ SOPN: 0.5000 mg | PEN_INJECTOR | SUBCUTANEOUS | 1 refills | Status: DC

## 2023-01-15 MED ORDER — SEMAGLUTIDE(0.25 OR 0.5MG/DOS) 2 MG/3ML ~~LOC~~ SOPN: 0.2500 mg | PEN_INJECTOR | SUBCUTANEOUS | 0 refills | Status: DC

## 2023-01-15 NOTE — Assessment & Plan Note (Signed)
3 month h/o worsening symptoms, refer Ortho for e/m

## 2023-01-15 NOTE — Assessment & Plan Note (Signed)
Obesity linked  with diabetes, hypertension, back pain  Unchanged Patient re-educated about  the importance of commitment to a  minimum of 150 minutes of exercise per week as able.  The importance of healthy food choices with portion control discussed, as well as eating regularly and within a 12 hour window most days. The need to choose "clean , green" food 50 to 75% of the time is discussed, as well as to make water the primary drink and set a goal of 64 ounces water daily.       01/15/2023    8:23 AM 10/04/2022   10:04 AM 09/28/2022   10:18 AM  Weight /BMI  Weight 187 lb 186 lb 188 lb 1.3 oz  Height 5' (1.524 m) 5' (1.524 m) 5' (1.524 m)  BMI 36.52 kg/m2 36.33 kg/m2 36.73 kg/m2

## 2023-01-15 NOTE — Assessment & Plan Note (Signed)
Hyperlipidemia:Low fat diet discussed and encouraged.   Lipid Panel  Lab Results  Component Value Date   CHOL 130 01/02/2023   HDL 58 01/02/2023   LDLCALC 57 01/02/2023   TRIG 73 01/02/2023   CHOLHDL 2.2 01/02/2023     Controlled, no change in medication

## 2023-01-15 NOTE — Patient Instructions (Signed)
Annual exam , and re eval blood sugar , call if you need me sooner  New for diabetes is once weekly semaglutide , pls let me know if you haver problems and can not tolerate.  Goal for fasting blood sugar ranges from 80 to 120 and 2 hours after any meal or at bedtime should be between 130 to 170. Check and document pre breakfast blood sugar every morning and bring log to visit. Nurse pls provide diabetic log sheet   Continue other diabetes ,meds as before  You are referred to Orthopedics re back and lower extremity pain  Nurse pls document shingrix vaccines, she has had both  Think about what you will eat, plan ahead. Choose " clean, green, fresh or frozen" over canned, processed or packaged foods which are more sugary, salty and fatty. 70 to 75% of food eaten should be vegetables and fruit. Three meals at set times with snacks allowed between meals, but they must be fruit or vegetables. Aim to eat over a 12 hour period , example 7 am to 7 pm, and STOP after  your last meal of the day. Drink water,generally about 64 ounces per day, no other drink is as healthy. Fruit juice is best enjoyed in a healthy way, by EATING the fruit. It is important that you exercise regularly at least 30 minutes 5 times a week. If you develop chest pain, have severe difficulty breathing, or feel very tired, stop exercising immediately and seek medical attention  Thanks for choosing [facility], we consider it a privelige to serve you.

## 2023-01-15 NOTE — Assessment & Plan Note (Signed)
Improving but not at goal, start ozempic , continue othe meds Michaela Peterson is reminded of the importance of commitment to daily physical activity for 30 minutes or more, as able and the need to limit carbohydrate intake to 30 to 60 grams per meal to help with blood sugar control.   The need to take medication as prescribed, test blood sugar as directed, and to call between visits if there is a concern that blood sugar is uncontrolled is also discussed.   Michaela Peterson is reminded of the importance of daily foot exam, annual eye examination, and good blood sugar, blood pressure and cholesterol control.     Latest Ref Rng & Units 01/02/2023   10:51 AM 09/28/2022   10:53 AM 04/20/2022   11:07 AM 01/02/2022    1:02 PM 11/17/2021   10:09 AM  Diabetic Labs  HbA1c 4.8 - 5.6 % 7.9  6.8  7.7  7.2    Micro/Creat Ratio 0 - 29 mg/g creat     40   Chol 100 - 199 mg/dL 188   416     HDL >60 mg/dL 58   56     Calc LDL 0 - 99 mg/dL 57   75     Triglycerides 0 - 149 mg/dL 73   85     Creatinine 0.57 - 1.00 mg/dL 6.30   1.60  1.09        01/15/2023    8:23 AM 10/04/2022   10:10 AM 10/04/2022   10:04 AM 09/28/2022   10:18 AM 07/25/2022    1:16 PM 07/25/2022    9:44 AM 05/08/2022   10:46 AM  BP/Weight  Systolic BP 122 148 168 133 142 131 181  Diastolic BP 62 86 69 74 78 78 91  Wt. (Lbs) 187  186 188.08  185.08   BMI 36.52 kg/m2  36.33 kg/m2 36.73 kg/m2  36.15 kg/m2       Latest Ref Rng & Units 07/30/2022   12:00 AM 02/26/2022   12:00 AM  Foot/eye exam completion dates  Eye Exam No Retinopathy No Retinopathy     No Retinopathy         This result is from an external source.

## 2023-01-15 NOTE — Progress Notes (Signed)
ARAH ARO     MRN: [id number]      DOB: 1955-07-29  Chief Complaint  Patient presents with   Leg Pain    Right worse than left, very painful at times and wakes her up at night     HPI Michaela Peterson is here for follow up and re-evaluation of chronic medical conditions, medication management and review of any available recent lab and radiology data.  Preventive health is updated, specifically  Cancer screening and Immunization.   Uncontrolled severe low back opain since early August radiaites to eright thigh and extends too right foot, at times feels as though something is crawling on her legBlood sugar fluctuating a lot , depends on what she eats ROS Denies recent fever or chills. Denies sinus pressure, nasal congestion, ear pain or sore throat. Denies chest congestion, productive cough or wheezing. Denies chest pains, palpitations and leg swelling Denies abdominal pain, nausea, vomiting,diarrhea or constipation.   Denies dysuria, frequency, hesitancy or incontinence. Denies depression, anxiety or insomnia. Denies skin break down or rash.   PE  BP 122/62   Pulse 83   Resp 16   Ht 5' (1.524 m)   Wt 187 lb (84.8 kg)   LMP 08/18/2012   SpO2 94%   BMI 36.52 kg/m   Patient alert and oriented and in no cardiopulmonary distress.  HEENT: No facial asymmetry, EOMI,     Neck supple .  Chest: Clear to auscultation bilaterally.  CVS: S1, S2 no murmurs, no S3.Regular rate.  ABD: Soft non tender.   Ext: No edema  WU:[id number]  ROM spine,  Skin: Intact, no ulcerations or rash noted.  Psych: Good eye contact, normal affect. Memory intact not anxious or depressed appearing.  CNS: CN 2-12 intact, grade 4 power in rLEd.   Assessment & Plan  Back pain with radiation 3 month h/o worsening symptoms, refer Ortho for e/m  Type 2 diabetes mellitus with neurological complications (HCC) Improving but not at goal, start ozempic , continue othe meds Michaela Peterson is reminded of the  importance of commitment to daily physical activity for 30 minutes or more, as able and the need to limit carbohydrate intake to 30 to 60 grams per meal to help with blood sugar control.   The need to take medication as prescribed, test blood sugar as directed, and to call between visits if there is a concern that blood sugar is uncontrolled is also discussed.   Michaela Peterson is reminded of the importance of daily foot exam, annual eye examination, and good blood sugar, blood pressure and cholesterol control.     Latest Ref Rng & Units 01/02/2023   10:51 AM 09/28/2022   10:53 AM 04/20/2022   11:07 AM 01/02/2022    1:02 PM 11/17/2021   10:09 AM  Diabetic Labs  HbA1c 4.8 - 5.6 % 7.9  6.8  7.7  7.2    Micro/Creat Ratio 0 - 29 mg/g creat     40   Chol 100 - 199 mg/dL 295   621     HDL >30 mg/dL 58   56     Calc LDL 0 - 99 mg/dL 57   75     Triglycerides 0 - 149 mg/dL 73   85     Creatinine 0.57 - 1.00 mg/dL 8.65   7.84  6.96        01/15/2023    8:23 AM 10/04/2022   10:10 AM 10/04/2022   10:04 AM 09/28/2022  10:18 AM 07/25/2022    1:16 PM 07/25/2022    9:44 AM 05/08/2022   10:46 AM  BP/Weight  Systolic BP 122 148 168 133 142 131 181  Diastolic BP 62 86 69 74 78 78 91  Wt. (Lbs) 187  186 188.08  185.08   BMI 36.52 kg/m2  36.33 kg/m2 36.73 kg/m2  36.15 kg/m2       Latest Ref Rng & Units 07/30/2022   12:00 AM 02/26/2022   12:00 AM  Foot/eye exam completion dates  Eye Exam No Retinopathy No Retinopathy     No Retinopathy         This result is from an external source.        Hyperlipidemia LDL goal <100 Hyperlipidemia:Low fat diet discussed and encouraged.   Lipid Panel  Lab Results  Component Value Date   CHOL 130 01/02/2023   HDL 58 01/02/2023   LDLCALC 57 01/02/2023   TRIG 73 01/02/2023   CHOLHDL 2.2 01/02/2023     Controlled, no change in medication   Hypertension goal BP (blood pressure) < 140/90 Controlled, no change in medication DASH diet and commitment to daily  physical activity for a minimum of 30 minutes discussed and encouraged, as a part of hypertension management. The importance of attaining a healthy weight is also discussed.     01/15/2023    8:23 AM 10/04/2022   10:10 AM 10/04/2022   10:04 AM 09/28/2022   10:18 AM 07/25/2022    1:16 PM 07/25/2022    9:44 AM 05/08/2022   10:46 AM  BP/Weight  Systolic BP 122 148 168 133 142 131 181  Diastolic BP 62 86 69 74 78 78 91  Wt. (Lbs) 187  186 188.08  185.08   BMI 36.52 kg/m2  36.33 kg/m2 36.73 kg/m2  36.15 kg/m2        Morbid obesity (HCC) Obesity linked  with diabetes, hypertension, back pain  Unchanged Patient re-educated about  the importance of commitment to a  minimum of 150 minutes of exercise per week as able.  The importance of healthy food choices with portion control discussed, as well as eating regularly and within a 12 hour window most days. The need to choose "clean , green" food 50 to 75% of the time is discussed, as well as to make water the primary drink and set a goal of 64 ounces water daily.       01/15/2023    8:23 AM 10/04/2022   10:04 AM 09/28/2022   10:18 AM  Weight /BMI  Weight 187 lb 186 lb 188 lb 1.3 oz  Height 5' (1.524 m) 5' (1.524 m) 5' (1.524 m)  BMI 36.52 kg/m2 36.33 kg/m2 36.73 kg/m2

## 2023-01-15 NOTE — Assessment & Plan Note (Signed)
Controlled, no change in medication DASH diet and commitment to daily physical activity for a minimum of 30 minutes discussed and encouraged, as a part of hypertension management. The importance of attaining a healthy weight is also discussed.     01/15/2023    8:23 AM 10/04/2022   10:10 AM 10/04/2022   10:04 AM 09/28/2022   10:18 AM 07/25/2022    1:16 PM 07/25/2022    9:44 AM 05/08/2022   10:46 AM  BP/Weight  Systolic BP 122 148 168 133 142 131 181  Diastolic BP 62 86 69 74 78 78 91  Wt. (Lbs) 187  186 188.08  185.08   BMI 36.52 kg/m2  36.33 kg/m2 36.73 kg/m2  36.15 kg/m2

## 2023-01-23 DIAGNOSIS — M25551 Pain in right hip: Secondary | ICD-10-CM | POA: Diagnosis not present

## 2023-01-23 DIAGNOSIS — M48061 Spinal stenosis, lumbar region without neurogenic claudication: Secondary | ICD-10-CM | POA: Diagnosis not present

## 2023-01-24 ENCOUNTER — Other Ambulatory Visit (HOSPITAL_COMMUNITY): Payer: Self-pay | Admitting: Sports Medicine

## 2023-01-24 DIAGNOSIS — M4800 Spinal stenosis, site unspecified: Secondary | ICD-10-CM

## 2023-01-24 DIAGNOSIS — M5416 Radiculopathy, lumbar region: Secondary | ICD-10-CM

## 2023-02-01 ENCOUNTER — Ambulatory Visit (HOSPITAL_COMMUNITY)
Admission: RE | Admit: 2023-02-01 | Discharge: 2023-02-01 | Disposition: A | Discharge status: Home / Self Care | Payer: Medicare Other | Source: Ambulatory Visit | Attending: Sports Medicine | Admitting: Sports Medicine

## 2023-02-01 DIAGNOSIS — M47816 Spondylosis without myelopathy or radiculopathy, lumbar region: Secondary | ICD-10-CM | POA: Diagnosis not present

## 2023-02-01 DIAGNOSIS — M5416 Radiculopathy, lumbar region: Secondary | ICD-10-CM | POA: Diagnosis not present

## 2023-02-01 DIAGNOSIS — M5126 Other intervertebral disc displacement, lumbar region: Secondary | ICD-10-CM | POA: Diagnosis not present

## 2023-02-01 DIAGNOSIS — M4800 Spinal stenosis, site unspecified: Secondary | ICD-10-CM | POA: Insufficient documentation

## 2023-02-01 DIAGNOSIS — M4316 Spondylolisthesis, lumbar region: Secondary | ICD-10-CM | POA: Diagnosis not present

## 2023-02-01 DIAGNOSIS — M48061 Spinal stenosis, lumbar region without neurogenic claudication: Secondary | ICD-10-CM | POA: Diagnosis not present

## 2023-02-06 ENCOUNTER — Telehealth: Payer: Self-pay

## 2023-02-06 ENCOUNTER — Ambulatory Visit (INDEPENDENT_AMBULATORY_CARE_PROVIDER_SITE_OTHER): Payer: Medicare Other

## 2023-02-06 VITALS — Ht 60.0 in | Wt 187.0 lb

## 2023-02-06 DIAGNOSIS — Z780 Asymptomatic menopausal state: Secondary | ICD-10-CM

## 2023-02-06 DIAGNOSIS — Z0000 Encounter for general adult medical examination without abnormal findings: Secondary | ICD-10-CM | POA: Diagnosis not present

## 2023-02-06 NOTE — Patient Instructions (Signed)
Michaela Peterson , Thank you for taking time to come for your Medicare Wellness Visit. I appreciate your ongoing commitment to your health goals. Please review the following plan we discussed and let me know if I can assist you in the future.   Referrals/Orders/Follow-Ups/Clinician Recommendations:  Next Medicare Annual Wellness Visit: February 10, 2024 at 10:40am virtual visit  You have an order for:  []   2D Mammogram  []   3D Mammogram  [x]   Bone Density   []   Lung Cancer Screening  Please call for appointment:   [facility] at [facility] [address] [place], [place] [address] [phone]  Make sure to wear two-piece clothing.  No lotions powders or deodorants the day of the appointment Make sure to bring picture ID and insurance card.  Bring list of medications you are currently taking including any supplements.   Schedule your [place] screening mammogram through MyChart!   Log into your MyChart account.  Go to 'Visit' (or 'Appointments' if on mobile App) --> Schedule an Appointment  Under 'Select a Reason for Visit' choose the Mammogram Screening option.  Complete the pre-visit questions and select the time and place that best fits your schedule.  You are due for the vaccines checked below. You may have these done at your preferred pharmacy. Please have them fax the office proof of the vaccines so that we can update your chart.   []  Flu (due annually)  Recommended this fall either at PCP office or through your local pharmacy. The flu season starts August 1 of each year.   []  Shingrix (Shingles vaccine): CDC recommends 2 doses of Shingrix separated by 2-6 months for aged 5 years and older:  []  Pneumonia Vaccines: Recommended for adults 65 years or older  []  TDAP (Tetanus) Vaccine every 10 years:Recommended every 10 years; Please call your insurance company to determine your out of pocket expense. You also receive this vaccine at your local pharmacy  or Health Dept.  [x]  Covid-19: Available now at any [facility] pharmacy (see info below)  You may also get your vaccines at any [facility] (locations listed below.) Vaccine hours are Monday - Friday 9:00 - 4:00. No appointments are required. Most insurances are accepted including Medicaid. Anyone can use the community pharmacies, and people are not required to have a [facility] provider.  Community Pharmacy Locations offering vaccines:   [facility] and exercise psychologist   [facility] [place] [facility]  10 vaccines are offered at the [employer]: Covid, flu, Tdap, shingles, RSV, pneumonia, meningococcal, hepatitis A, hepatitis B, and HPV.    This is a list of the screening recommended for you and due dates:  Health Maintenance  Topic Date Due   COVID-19 Vaccine (6 - 2023-24 season) 11/11/2022   DEXA scan (bone density measurement)  01/07/2023   Yearly kidney health urinalysis for diabetes  06/20/2023   Hemoglobin A1C  07/03/2023   Eye exam for diabetics  07/30/2023   Complete foot exam   09/28/2023   Mammogram  10/17/2023   Colon Cancer Screening  11/12/2023   DTaP/Tdap/Td vaccine (3 - Td or Tdap) 12/24/2023   Yearly kidney function blood test for diabetes  01/02/2024   Medicare Annual Wellness Visit  02/06/2024   Pneumonia Vaccine  Completed   Flu Shot  Completed   Hepatitis C Screening  Completed   Zoster (Shingles) Vaccine  Completed  HPV Vaccine  Aged Out    Advanced directives: (Provided) Advance directive discussed with you today. I have provided a copy for you to complete at home and have notarized. Once this is complete, please bring a copy in to our office so we can scan it into your chart.   Next Medicare Annual Wellness Visit scheduled for next year: Yes  Bone Density Test A bone density test uses a type of X-ray to measure the amount of calcium and other minerals in a  person's bones. It can measure bone density in the hip and the spine. The test is similar to having a regular X-ray. This test may also be called: Bone densitometry. Bone mineral density test. Dual-energy X-ray absorptiometry (DEXA). You may have this test to: Diagnose a condition that causes weak or thin bones (osteoporosis). Screen you for osteoporosis. Predict your risk for a broken bone (fracture). Determine how well your osteoporosis treatment is working. Tell a health care provider about: Any allergies you have. All medicines you are taking, including vitamins, herbs, eye drops, creams, and over-the-counter medicines. Any problems you or family members have had with anesthetic medicines. Any blood disorders you have. Any surgeries you have had. Any medical conditions you have. Whether you are pregnant or may be pregnant. Any medical tests you have had within the past 14 days that used contrast material. What are the risks? Generally, this is a safe test. However, it does expose you to a small amount of radiation, which can slightly increase your cancer risk. What happens before the test? Do not take any calcium supplements within the 24 hours before your test. You will need to remove all metal jewelry, eyeglasses, removable dental appliances, and any other metal objects on your body. What happens during the test?  You will lie down on an exam table. There will be an X-ray generator below you and an imaging device above you. Other devices, such as boxes or braces, may be used to position your body properly for the scan. The machine will slowly scan your body. You will need to keep very still while the machine does the scan. The images will show up on a screen in the room. Images will be examined by a specialist after your test is finished. The procedure may vary among health care providers and hospitals. What can I expect after the test? It is up to you to get the results of your  test. Ask your health care provider, or the department that is doing the test, when your results will be ready. Summary A bone density test is an imaging test that uses a type of X-ray to measure the amount of calcium and other minerals in your bones. The test may be used to diagnose or screen you for a condition that causes weak or thin bones (osteoporosis), predict your risk for a broken bone (fracture), or determine how well your osteoporosis treatment is working. Do not take any calcium supplements within 24 hours before your test. Ask your health care provider, or the department that is doing the test, when your results will be ready. This information is not intended to replace advice given to you by your health care provider. Make sure you discuss any questions you have with your health care provider. Document Revised: 11/09/2020 Document Reviewed: 08/13/2019 Elsevier Patient Education  2024 [employer]. Understanding Your Risk for Falls Millions of people have serious injuries from falls each year. It is important to understand your risk of falling.  Talk with your health care provider about your risk and what you can do to lower it. If you do have a serious fall, make sure to tell your provider. Falling once raises your risk of falling again. How can falls affect me? Serious injuries from falls are common. These include: Broken bones, such as hip fractures. Head injuries, such as traumatic brain injuries (TBI) or concussions. A fear of falling can cause you to avoid activities and stay at home. This can make your muscles weaker and raise your risk for a fall. What can increase my risk? There are a number of risk factors that increase your risk for falling. The more risk factors you have, the higher your risk of falling. Serious injuries from a fall happen most often to people who are older than 67 years old. Teenagers and young adults ages 1-29 are also at higher risk. Common risk  factors include: Weakness in the lower body. Being generally weak or confused due to long-term (chronic) illness. Dizziness or balance problems. Poor vision. Medicines that cause dizziness or drowsiness. These may include: Medicines for your blood pressure, heart, anxiety, insomnia, or swelling (edema). Pain medicines. Muscle relaxants. Other risk factors include: Drinking alcohol. Having had a fall in the past. Having foot pain or wearing improper footwear. Working at a dangerous job. Having any of the following in your home: Tripping hazards, such as floor clutter or loose rugs. Poor lighting. Pets. Having dementia or memory loss. What actions can I take to lower my risk of falling?     Physical activity Stay physically fit. Do strength and balance exercises. Consider taking a regular class to build strength and balance. Yoga and tai chi are good options. Vision Have your eyes checked every year and your prescription for glasses or contacts updated as needed. Shoes and walking aids Wear non-skid shoes. Wear shoes that have rubber soles and low heels. Do not wear high heels. Do not walk around the house in socks or slippers. Use a cane or walker as told by your provider. Home safety Attach secure railings on both sides of your stairs. Install grab bars for your bathtub, shower, and toilet. Use a non-skid mat in your bathtub or shower. Attach bath mats securely with double-sided, non-slip rug tape. Use good lighting in all rooms. Keep a flashlight near your bed. Make sure there is a clear path from your bed to the bathroom. Use night-lights. Do not use throw rugs. Make sure all carpeting is taped or tacked down securely. Remove all clutter from walkways and stairways, including extension cords. Repair uneven or broken steps and floors. Avoid walking on icy or slippery surfaces. Walk on the grass instead of on icy or slick sidewalks. Use ice melter to get rid of ice on  walkways in the winter. Use a cordless phone. Questions to ask your health care provider Can you help me check my risk for a fall? Do any of my medicines make me more likely to fall? Should I take a vitamin D supplement? What exercises can I do to improve my strength and balance? Should I make an appointment to have my vision checked? Do I need a bone density test to check for weak bones (osteoporosis)? Would it help to use a cane or a walker? Where to find more information Centers for Disease Control and Prevention, [employer]: TonerPromos.no Community-Based Fall Prevention Programs: TonerPromos.no [employer] on Aging: BaseRingTones.pl Contact a health care provider if: You fall at home. You are afraid  of falling at home. You feel weak, drowsy, or dizzy. This information is not intended to replace advice given to you by your health care provider. Make sure you discuss any questions you have with your health care provider. Document Revised: 10/30/2021 Document Reviewed: 10/30/2021 Elsevier Patient Education  2024 [employer]. Preventive Care 65 Years and Older, Female Preventive care refers to lifestyle choices and visits with your health care provider that can promote health and wellness. Preventive care visits are also called wellness exams. What can I expect for my preventive care visit? Counseling Your health care provider may ask you questions about your: Medical history, including: Past medical problems. Family medical history. Pregnancy and menstrual history. History of falls. Current health, including: Memory and ability to understand (cognition). Emotional well-being. Home life and relationship well-being. Sexual activity and sexual health. Lifestyle, including: Alcohol, nicotine or tobacco, and drug use. Access to firearms. Diet, exercise, and sleep habits. Work and work Astronomer. Sunscreen use. Safety issues such as seatbelt and bike helmet use. Physical exam Your health  care provider will check your: Height and weight. These may be used to calculate your BMI (body mass index). BMI is a measurement that tells if you are at a healthy weight. Waist circumference. This measures the distance around your waistline. This measurement also tells if you are at a healthy weight and may help predict your risk of certain diseases, such as type 2 diabetes and high blood pressure. Heart rate and blood pressure. Body temperature. Skin for abnormal spots. What immunizations do I need?  Vaccines are usually given at various ages, according to a schedule. Your health care provider will recommend vaccines for you based on your age, medical history, and lifestyle or other factors, such as travel or where you work. What tests do I need? Screening Your health care provider may recommend screening tests for certain conditions. This may include: Lipid and cholesterol levels. Hepatitis C test. Hepatitis B test. HIV (human immunodeficiency virus) test. STI (sexually transmitted infection) testing, if you are at risk. Lung cancer screening. Colorectal cancer screening. Diabetes screening. This is done by checking your blood sugar (glucose) after you have not eaten for a while (fasting). Mammogram. Talk with your health care provider about how often you should have regular mammograms. BRCA-related cancer screening. This may be done if you have a family history of breast, ovarian, tubal, or peritoneal cancers. Bone density scan. This is done to screen for osteoporosis. Talk with your health care provider about your test results, treatment options, and if necessary, the need for more tests. Follow these instructions at home: Eating and drinking  Eat a diet that includes fresh fruits and vegetables, whole grains, lean protein, and low-fat dairy products. Limit your intake of foods with high amounts of sugar, saturated fats, and salt. Take vitamin and mineral supplements as recommended  by your health care provider. Do not drink alcohol if your health care provider tells you not to drink. If you drink alcohol: Limit how much you have to 0-1 drink a day. Know how much alcohol is in your drink. In the [place] one drink equals one 12 oz bottle of beer (355 mL), one 5 oz glass of wine (148 mL), or one 1 oz glass of hard liquor (44 mL). Lifestyle Brush your teeth every morning and night with fluoride toothpaste. Floss one time each day. Exercise for at least 30 minutes 5 or more days each week. Do not use any products that contain nicotine or tobacco.  These products include cigarettes, chewing tobacco, and vaping devices, such as e-cigarettes. If you need help quitting, ask your health care provider. Do not use drugs. If you are sexually active, practice safe sex. Use a condom or other form of protection in order to prevent STIs. Take aspirin only as told by your health care provider. Make sure that you understand how much to take and what form to take. Work with your health care provider to find out whether it is safe and beneficial for you to take aspirin daily. Ask your health care provider if you need to take a cholesterol-lowering medicine (statin). Find healthy ways to manage stress, such as: Meditation, yoga, or listening to music. Journaling. Talking to a trusted person. Spending time with friends and family. Minimize exposure to UV radiation to reduce your risk of skin cancer. Safety Always wear your seat belt while driving or riding in a vehicle. Do not drive: If you have been drinking alcohol. Do not ride with someone who has been drinking. When you are tired or distracted. While texting. If you have been using any mind-altering substances or drugs. Wear a helmet and other protective equipment during sports activities. If you have firearms in your house, make sure you follow all gun safety procedures. What's next? Visit your health care provider once a year for an  annual wellness visit. Ask your health care provider how often you should have your eyes and teeth checked. Stay up to date on all vaccines. This information is not intended to replace advice given to you by your health care provider. Make sure you discuss any questions you have with your health care provider. Document Revised: 08/24/2020 Document Reviewed: 08/24/2020 Elsevier Patient Education  2024 [employer].

## 2023-02-06 NOTE — Progress Notes (Signed)
Because this visit was a virtual/telehealth visit,  certain criteria was not obtained, such a blood pressure, CBG if applicable, and timed get up and go. Any medications not marked as "taking" were not mentioned during the medication reconciliation part of the visit. Any vitals not documented were not able to be obtained due to this being a telehealth visit or patient was unable to self-report a recent blood pressure reading due to a lack of equipment at home via telehealth. Vitals that have been documented are verbally provided by the patient.   Subjective:   Michaela Peterson is a 67 y.o. female who presents for Medicare Annual (Subsequent) preventive examination.  Visit Complete: Virtual I connected with  Michaela Peterson on 02/06/23 by a audio enabled telemedicine application and verified that I am speaking with the correct person using two identifiers.  Patient Location: Home  Provider Location: Home Office  I discussed the limitations of evaluation and management by telemedicine. The patient expressed understanding and agreed to proceed.  Vital Signs: Because this visit was a virtual/telehealth visit, some criteria may be missing or patient reported. Any vitals not documented were not able to be obtained and vitals that have been documented are patient reported.  Patient Medicare AWV questionnaire was completed by the patient on na; I have confirmed that all information answered by patient is correct and no changes since this date.  Cardiac Risk Factors include: advanced age (>7men, >64 women);diabetes mellitus;dyslipidemia;hypertension;smoking/ tobacco exposure;sedentary lifestyle;obesity (BMI >30kg/m2)     Objective:    Today's Vitals   02/06/23 0800 02/06/23 0804  Weight: 187 lb (84.8 kg)   Height: 5' (1.524 m)   PainSc:  2    Body mass index is 36.52 kg/m.     02/06/2023    7:59 AM 12/25/2021    9:08 AM 03/07/2021   10:41 AM 12/21/2020    9:11 AM 12/25/2019    9:30 AM  12/21/2019    8:39 AM 05/06/2019   11:31 AM  Advanced Directives  Does Patient Have a Medical Advance Directive? No No No No No No No  Would patient like information on creating a medical advance directive? Yes (MAU/Ambulatory/Procedural Areas - Information given) Yes (ED - Information included in AVS) No - Patient declined No - Patient declined No - Patient declined No - Patient declined Yes (MAU/Ambulatory/Procedural Areas - Information given)    Current Medications (verified) Outpatient Encounter Medications as of 02/06/2023  Medication Sig   allopurinol (ZYLOPRIM) 100 MG tablet Take 1 tablet (100 mg total) by mouth daily.   aspirin EC 81 MG tablet Take 81 mg by mouth daily.   carvedilol (COREG) 6.25 MG tablet Take 6.25 mg by mouth 2 (two) times daily.   cetirizine (ZYRTEC) 10 MG tablet Take 1 tablet (10 mg total) by mouth daily.   Cholecalciferol (VITAMIN D3) 50 MCG (2000 UT) TABS Take 2,000 Units by mouth daily.   cholestyramine (QUESTRAN) 4 g packet Take 4 g by mouth 3 (three) times daily with meals.   clotrimazole-betamethasone (LOTRISONE) cream Apply 1 Application topically 2 (two) times daily.   fluticasone (FLONASE) 50 MCG/ACT nasal spray USE 2 SPRAYS IN BOTH NOSTRILS  DAILY   glipiZIDE (GLUCOTROL) 10 MG tablet TAKE 1 TABLET BY MOUTH TWICE  DAILY BEFORE MEALS   glucose blood (ONETOUCH ULTRA) test strip TEST ONCE DAILY   ibuprofen (ADVIL) 800 MG tablet Take 800 mg by mouth every 8 (eight) hours as needed.   Lancets (ONETOUCH DELICA PLUS LANCET33G) MISC Test  once daily DX e11.9   Lancets (ONETOUCH ULTRASOFT) lancets Use as instructed   metFORMIN (GLUCOPHAGE) 1000 MG tablet TAKE 1 TABLET BY MOUTH TWICE  DAILY WITH MEALS   montelukast (SINGULAIR) 10 MG tablet TAKE 1 TABLET BY MOUTH AT  BEDTIME   Multiple Vitamin (MULTIVITAMIN) capsule Take 1 capsule by mouth daily.   NIFEdipine (ADALAT CC) 90 MG 24 hr tablet TAKE 1 TABLET BY MOUTH DAILY   olmesartan (BENICAR) 40 MG tablet TAKE 1  TABLET BY MOUTH DAILY   pantoprazole (PROTONIX) 40 MG tablet TAKE 1 TABLET BY MOUTH DAILY  BEFORE BREAKFAST   RESTASIS 0.05 % ophthalmic emulsion 1 drop 2 (two) times daily.   rosuvastatin (CRESTOR) 5 MG tablet TAKE ONE TABLET BY MOUTH EVERY MONDAY AND FRIDAY   spironolactone (ALDACTONE) 100 MG tablet Take 1 tablet (100 mg total) by mouth daily.   diclofenac Sodium (VOLTAREN) 1 % GEL Apply topically 4 (four) times daily. (Patient not taking: Reported on 01/15/2023)   fluorometholone (FML) 0.1 % ophthalmic suspension Place 1 drop into both eyes 3 (three) times daily. (Patient not taking: Reported on 02/06/2023)   Magnesium 400 MG CAPS Take by mouth. (Patient not taking: Reported on 02/06/2023)   Semaglutide,0.25 or 0.5MG /DOS, 2 MG/3ML SOPN Inject 0.25 mg into the skin once a week. (Patient not taking: Reported on 02/06/2023)   [START ON 02/12/2023] Semaglutide,0.25 or 0.5MG /DOS, 2 MG/3ML SOPN Inject 0.5 mg into the skin once a week. (Patient not taking: Reported on 02/06/2023)   [DISCONTINUED] carvedilol (COREG) 3.125 MG tablet Take 1 tablet (3.125 mg total) by mouth 2 (two) times daily with a meal.   No facility-administered encounter medications on file as of 02/06/2023.    Allergies (verified) Gabapentin, Ace inhibitors, Aspirin, Oxycodone, Penicillins, Percocet [oxycodone-acetaminophen], Rebif [interferon beta-1a], and Statins   History: Past Medical History:  Diagnosis Date   Adnexal mass    Left; followed by Dr. Emelda Fear   Anemia, iron deficiency    Arthritis    Chronic low back pain    Diabetes mellitus, type 2 (HCC)    Essential hypertension    Gastroparesis    GERD (gastroesophageal reflux disease)    MS (multiple sclerosis) (HCC)    Nonalcoholic fatty liver disease    Obesity    Sleep apnea    Past Surgical History:  Procedure Laterality Date   AGILE CAPSULE N/A 08/11/2014   Procedure: AGILE CAPSULE;  Surgeon: Corbin Ade, MD;  [facility];  Service:  Endoscopy;  Laterality: N/A;  0700   BALLOON DILATION N/A 03/07/2021   Procedure: BALLOON DILATION;  Surgeon: Lanelle Bal, DO;  [facility]: [facility];  Service: Endoscopy;  Laterality: N/A;   BIOPSY  03/07/2021   Procedure: BIOPSY;  Surgeon: Lanelle Bal, DO;  [facility];  Service: Endoscopy;;   BLADDER SUSPENSION     CESAREAN SECTION     x2    CHOLECYSTECTOMY     COLONOSCOPY  2010   Dr. Jena Gauss: tubular adenoma, few scattered diverticula   COLONOSCOPY N/A 10/01/2013   Dr. Thurston Pounds preparation. Normal rectum. Normal colonic mucosa   COLONOSCOPY N/A 11/12/2018   sigmoid and descending colon diverticulosis. Two 4-5 mm polyps in ascending colon. (tubular adenomas). Surveillance in 5 years   DILATION AND CURETTAGE OF UTERUS     4   ESOPHAGOGASTRODUODENOSCOPY  2010   Dr. Jena Gauss: normal esophagus, small hiatal hernia, questionable pale duodenal mucosa but negative for celiac sprue.    ESOPHAGOGASTRODUODENOSCOPY N/A 10/01/2013  Dr. Rourk:gastric polyps-status post biopsy. Otherwise, normal EGD. fundic gland polyp, negative H.pylori   ESOPHAGOGASTRODUODENOSCOPY (EGD) WITH PROPOFOL N/A 03/07/2021   Procedure: ESOPHAGOGASTRODUODENOSCOPY (EGD) WITH PROPOFOL;  Surgeon: Lanelle Bal, DO;  [facility];  Service: Endoscopy;  Laterality: N/A;  12:00pm   GIVENS CAPSULE STUDY N/A 09/08/2014   Poor prep but overall unremarkable    KNEE ARTHROSCOPY W/ DEBRIDEMENT     LAPAROSCOPY ABDOMEN DIAGNOSTIC     lysis of adhesions   POLYPECTOMY  11/12/2018   Procedure: POLYPECTOMY;  Surgeon: Corbin Ade, MD;  [facility];  Service: Endoscopy;;   ROTATOR CUFF REPAIR     TENDON LENGTHENING     Left wrist   UMBILICAL HERNIA REPAIR     Family History  Problem Relation Age of Onset   Heart failure Mother    Hypertension Mother    Arthritis Mother    Stroke Father    Hypertension Sister    Diabetes Sister    Heart disease Sister    Kidney failure Sister     Hypertension Sister    Hypertension Brother    Mental illness Brother    Hypertension Son    Colon cancer Maternal Aunt    Social History   Socioeconomic History   Marital status: Married    Spouse name: Michaela Peterson    Number of children: 4   Years of education: 13   Highest education level: Not on file  Occupational History   Occupation: unemployed     Associate Professor: UNEMPLOYED  Tobacco Use   Smoking status: Never   Smokeless tobacco: Never   Tobacco comments:    patient lives with a smoker   Vaping Use   Vaping status: Never Used  Substance and Sexual Activity   Alcohol use: No   Drug use: No   Sexual activity: Yes    Birth control/protection: Post-menopausal  Other Topics Concern   Not on file  Social History Narrative   Patient lives at home with husband Michaela Peterson.    Patient has 4 children.    Patient has a some college.    Patient is right handed.    Patient is currently not working.    Social Determinants of Health   Financial Resource Strain: Low Risk  (02/06/2023)   Overall Financial Resource Strain (CARDIA)    Difficulty of Paying Living Expenses: Not hard at all  Food Insecurity: No Food Insecurity (02/06/2023)   Hunger Vital Sign    Worried About Running Out of Food in the Last Year: Never true    Ran Out of Food in the Last Year: Never true  Transportation Needs: No Transportation Needs (02/06/2023)   PRAPARE - Administrator, Civil Service (Medical): No    Lack of Transportation (Non-Medical): No  Physical Activity: Patient Declined (02/06/2023)   Exercise Vital Sign    Days of Exercise per Week: Patient declined    Minutes of Exercise per Session: Patient declined  Stress: No Stress Concern Present (02/06/2023)   [employer] of Occupational Health - Occupational Stress Questionnaire    Feeling of Stress : Not at all  Social Connections: Moderately Integrated (02/06/2023)   Social Connection and Isolation Panel [NHANES]    Frequency of  Communication with Friends and Family: More than three times a week    Frequency of Social Gatherings with Friends and Family: More than three times a week    Attends Religious Services: More than 4 times per year  Active Member of Clubs or Organizations: No    Attends Banker Meetings: Never    Marital Status: Married    Tobacco Counseling Counseling given: Yes Tobacco comments: patient lives with a smoker    Clinical Intake:  Pre-visit preparation completed: Yes  Pain : 0-10 Pain Score: 2  Pain Type: Chronic pain Pain Location: Back Pain Orientation: Lower Pain Radiating Towards: bilateral thighs and legs Pain Descriptors / Indicators: Aching, Nagging Pain Onset: More than a month ago     BMI - recorded: 36.52 Nutritional Status: BMI > 30  Obese Nutritional Risks: None Diabetes: Yes (telehealth visit) CBG done?: No Did pt. bring in CBG monitor from home?: No  How often do you need to have someone help you when you read instructions, pamphlets, or other written materials from your doctor or pharmacy?: 1 - Never  Interpreter Needed?: No  Information entered by :: Michaela Peterson, Michaela Peterson   Activities of Daily Living    02/06/2023    8:15 AM  In your present state of health, do you have any difficulty performing the following activities:  Hearing? 0  Vision? 0  Difficulty concentrating or making decisions? 0  Walking or climbing stairs? 1  Comment due to chronic pain. Patient uses a cane when she goes out  Dressing or bathing? 0  Doing errands, shopping? 0  Preparing Food and eating ? N  Using the Toilet? N  In the past six months, have you accidently leaked urine? N  Do you have problems with loss of bowel control? N  Managing your Medications? N  Managing your Finances? N  Housekeeping or managing your Housekeeping? N    Patient Care Team: Kerri Perches, MD as PCP - General (Family Medicine) Jonelle Sidle, MD as PCP - Cardiology  (Cardiology) Jena Gauss Gerrit Friends, MD as Consulting Physician (Gastroenterology) Nilda Riggs, NP as Nurse Practitioner (Family Medicine) Salvatore Marvel, MD as Consulting Physician (Orthopedic Surgery) Randa Lynn, MD as Consulting Physician (Nephrology)  Indicate any recent Medical Services you may have received from other than [facility] providers in the past year (date may be approximate).     Assessment:   This is a routine wellness examination for Michaela Peterson.  Hearing/Vision screen Hearing Screening - Comments:: Patient denies any hearing difficulties.   Vision Screening - Comments:: Wears rx glasses - up to date with routine eye exams  Sees Dr. Charise Killian @ [facility]   Goals Addressed             This Visit's Progress    Patient Stated       Stop hurting       Depression Screen    02/06/2023    8:10 AM 01/15/2023    8:24 AM 10/04/2022   10:09 AM 09/28/2022   10:19 AM 07/25/2022    9:48 AM 04/26/2022   10:31 AM 04/06/2022    1:19 PM  PHQ 2/9 Scores  PHQ - 2 Score 0 0 0 0 0 0 0  PHQ- 9 Score 0  0  1 3 2     Fall Risk    02/06/2023    8:14 AM 01/15/2023    8:24 AM 10/04/2022   10:09 AM 09/28/2022   10:19 AM 07/25/2022    9:48 AM  Fall Risk   Falls in the past year? 0 0 0 0 0  Number falls in past yr: 0 0 0 0 0  Injury with Fall? 0 0 0 0 0  Risk for fall due to : No Fall Risks  No Fall Risks No Fall Risks No Fall Risks  Follow up Falls prevention discussed;Education provided  Falls evaluation completed Falls evaluation completed Falls evaluation completed    MEDICARE RISK AT HOME: Medicare Risk at Home Any stairs in or around the home?: Yes If so, are there any without handrails?: No Home free of loose throw rugs in walkways, pet beds, electrical cords, etc?: Yes Adequate lighting in your home to reduce risk of falls?: Yes Life alert?: No Use of a cane, walker or w/c?: Yes Grab bars in the bathroom?: Yes Shower chair or bench in shower?:  Yes Elevated toilet seat or a handicapped toilet?: Yes  TIMED UP AND GO:  Was the test performed?  No    Cognitive Function:    12/25/2021    9:09 AM  MMSE - Mini Mental State Exam  Not completed: Unable to complete        02/06/2023    8:10 AM 12/25/2021    9:10 AM 12/21/2020    9:14 AM 12/21/2019    8:43 AM 11/05/2018    9:56 AM  6CIT Screen  What Year? 0 points 0 points 0 points 0 points 0 points  What month? 0 points 0 points 0 points 0 points 0 points  What time? 0 points 0 points 0 points 0 points 0 points  Count back from 20 0 points 0 points 0 points 0 points 0 points  Months in reverse 0 points 0 points 0 points 0 points 0 points  Repeat phrase 0 points 0 points 0 points 0 points 0 points  Total Score 0 points 0 points 0 points 0 points 0 points    Immunizations Immunization History  Administered Date(s) Administered   Fluad Quad(high Dose 65+) 11/17/2021   Fluad Trivalent(High Dose 65+) 01/02/2023   Influenza Split 01/17/2011, 01/09/2012   Influenza Whole 12/11/2006, 12/29/2008, 11/28/2009   Influenza,inj,Quad PF,6+ Mos 01/08/2013, 12/23/2013, 01/18/2015, 12/14/2015, 12/19/2016, 12/20/2017, 12/17/2018, 11/03/2019, 11/08/2020   Moderna SARS-COV2 Booster Vaccination 02/22/2021   Moderna Sars-Covid-2 Vaccination 05/20/2019, 06/17/2019, 01/26/2020, 07/28/2020, 07/28/2020   PNEUMOCOCCAL CONJUGATE-20 01/03/2021   Pneumococcal Conjugate-13 10/19/2013   Pneumococcal Polysaccharide-23 11/30/2003, 08/30/2010   Td 11/30/2003   Tdap 12/23/2013   Zoster Recombinant(Shingrix) 10/19/2021, 05/24/2022   Zoster, Live 02/29/2016    TDAP status: Up to date  Flu Vaccine status: Up to date  Pneumococcal vaccine status: Up to date  Covid-19 vaccine status: Information provided on how to obtain vaccines.   Qualifies for Shingles Vaccine? Yes   Zostavax completed Yes   Shingrix Completed?: Yes  Screening Tests Health Maintenance  Topic Date Due   COVID-19 Vaccine (6  - 2023-24 season) 11/11/2022   Medicare Annual Wellness (AWV)  12/26/2022   Diabetic kidney evaluation - Urine ACR  06/20/2023   HEMOGLOBIN A1C  07/03/2023   OPHTHALMOLOGY EXAM  07/30/2023   FOOT EXAM  09/28/2023   Colonoscopy  11/12/2023   DTaP/Tdap/Td (3 - Td or Tdap) 12/24/2023   Diabetic kidney evaluation - eGFR measurement  01/02/2024   MAMMOGRAM  10/16/2024   Pneumonia Vaccine 44+ Years old  Completed   INFLUENZA VACCINE  Completed   DEXA SCAN  Completed   Hepatitis C Screening  Completed   Zoster Vaccines- Shingrix  Completed   HPV VACCINES  Aged Out    Health Maintenance  Health Maintenance Due  Topic Date Due   COVID-19 Vaccine (6 - 2023-24 season) 11/11/2022   Medicare Annual Wellness (  AWV)  12/26/2022    Colorectal cancer screening: Type of screening: Colonoscopy. Completed 11/12/2018. Repeat every 5 years  Mammogram status: Completed 10/17/2022. Repeat every year  Bone Density status: Ordered 02/06/2023. Pt provided with contact info and advised to call to schedule appt.  Lung Cancer Screening: (Low Dose CT Chest recommended if Age 79-80 years, 20 pack-year currently smoking OR have quit w/in 15years.) does not qualify.   Lung Cancer Screening Referral: na  Additional Screening:  Hepatitis C Screening: does not qualify; Completed   Vision Screening: Recommended annual ophthalmology exams for early detection of glaucoma and other disorders of the eye. Is the patient up to date with their annual eye exam?  Yes  Who is the provider or what is the name of the office in which the patient attends annual eye exams? Dr. Daisy Lazar If pt is not established with a provider, would they like to be referred to a provider to establish care? No .   Dental Screening: Recommended annual dental exams for proper oral hygiene  Diabetic Foot Exam: Diabetic Foot Exam: Completed 09/28/2022  Community Resource Referral / Chronic Care Management: CRR required this visit?  No    CCM required this visit?  No     Plan:     I have personally reviewed and noted the following in the patient's chart:   Medical and social history Use of alcohol, tobacco or illicit drugs  Current medications and supplements including opioid prescriptions. Patient is not currently taking opioid prescriptions. Functional ability and status Nutritional status Physical activity Advanced directives List of other physicians Hospitalizations, surgeries, and ER visits in previous 12 months Vitals Screenings to include cognitive, depression, and falls Referrals and appointments  In addition, I have reviewed and discussed with patient certain preventive protocols, quality metrics, and best practice recommendations. A written personalized care plan for preventive services as well as general preventive health recommendations were provided to patient.     Michaela Peterson, Michaela Peterson   02/06/2023   After Visit Summary: (Mail) Due to this being a telephonic visit, the after visit summary with patients personalized plan was offered to patient via mail   Nurse Notes: see routing comment

## 2023-02-06 NOTE — Telephone Encounter (Signed)
Copied from CRM [id number]. Topic: General - Other >> Feb 06, 2023  7:58 AM Conni Elliot wrote: Reason for CRM: pt is expecting call from nurse and would like to be contacted at her mobile [phone], home phone currently is not working

## 2023-02-18 DIAGNOSIS — M48061 Spinal stenosis, lumbar region without neurogenic claudication: Secondary | ICD-10-CM | POA: Diagnosis not present

## 2023-02-28 ENCOUNTER — Ambulatory Visit: Payer: Self-pay | Admitting: Family Medicine

## 2023-02-28 NOTE — Telephone Encounter (Signed)
  Chief Complaint: hypotension Symptoms: near syncopal when standing,  Frequency: today  Disposition: [x] ED /[] Urgent Care (no appt availability in office) / [] Appointment(In office/virtual)/ []  [place] [facility]/ [] Home Care/ [] Refused Recommended Disposition /[] [facility]/ []  Follow-up with PCP  Additional Notes: Pt denies SOB despite not speaking in full sentences.  Sounds to triager as though she is SOB.  With husband during the call, husband states she appears SOB. Pt advised to go to ED, denies wanting 911 called. Pt states she feels okay standing and her husband will drive her to the ED.  Pt agreeable to go to the ED. Most recent BP was taken during the call, pt was 89/50, pt was alert and oriented for duration of the call.    Copied from CRM [id number]. Topic: Clinical - Red Word Triage >> Feb 28, 2023  4:53 PM Fuller Mandril wrote: Red Word that prompted transfer to Nurse Triage: low Bp 96/54, continues to drop, staggering like drunk and fluttering in chest Reason for Disposition . [1] Systolic BP < 90 AND [2] dizzy, lightheaded, or weak  Answer Assessment - Initial Assessment Questions 1. BLOOD PRESSURE: "What is the blood pressure?" "Did you take at least two measurements 5 minutes apart?"     99/56 and 96/52.  Currently 89/50 2. ONSET: "When did you take your blood pressure?"     This afternoon 3. HOW: "How did you obtain the blood pressure?" (e.g., visiting nurse, automatic home BP monitor)     automatic 4. HISTORY: "Do you have a history of low blood pressure?" "What is your blood pressure normally?"     130s systolic 5. MEDICINES: "Are you taking any medications for blood pressure?" If Yes, ask: "Have they been changed recently?"     Takes her meds mid day for BP  Protocols used: Blood Pressure - Low-A-AH

## 2023-03-07 ENCOUNTER — Other Ambulatory Visit: Payer: Self-pay | Admitting: Family Medicine

## 2023-03-11 DIAGNOSIS — E1129 Type 2 diabetes mellitus with other diabetic kidney complication: Secondary | ICD-10-CM | POA: Diagnosis not present

## 2023-03-11 DIAGNOSIS — E1122 Type 2 diabetes mellitus with diabetic chronic kidney disease: Secondary | ICD-10-CM | POA: Diagnosis not present

## 2023-03-11 DIAGNOSIS — D649 Anemia, unspecified: Secondary | ICD-10-CM | POA: Diagnosis not present

## 2023-03-11 DIAGNOSIS — D638 Anemia in other chronic diseases classified elsewhere: Secondary | ICD-10-CM | POA: Diagnosis not present

## 2023-03-11 DIAGNOSIS — N1832 Chronic kidney disease, stage 3b: Secondary | ICD-10-CM | POA: Diagnosis not present

## 2023-03-12 LAB — LAB REPORT - SCANNED
Creatinine, POC: 91.3 mg/dL
EGFR: 43

## 2023-03-14 ENCOUNTER — Encounter: Payer: Self-pay | Admitting: Internal Medicine

## 2023-03-18 ENCOUNTER — Other Ambulatory Visit: Payer: Self-pay | Admitting: Family Medicine

## 2023-03-20 DIAGNOSIS — E1122 Type 2 diabetes mellitus with diabetic chronic kidney disease: Secondary | ICD-10-CM | POA: Diagnosis not present

## 2023-03-20 DIAGNOSIS — E1129 Type 2 diabetes mellitus with other diabetic kidney complication: Secondary | ICD-10-CM | POA: Diagnosis not present

## 2023-03-20 DIAGNOSIS — I5032 Chronic diastolic (congestive) heart failure: Secondary | ICD-10-CM | POA: Diagnosis not present

## 2023-03-20 DIAGNOSIS — D509 Iron deficiency anemia, unspecified: Secondary | ICD-10-CM | POA: Diagnosis not present

## 2023-03-22 ENCOUNTER — Ambulatory Visit: Payer: Self-pay | Admitting: Family Medicine

## 2023-03-22 NOTE — Telephone Encounter (Signed)
  Chief Complaint: URI Symptoms: cough, sore throat, bilateral ear pain, runny nose Frequency: 4 days Pertinent Negatives: Patient denies SOB, denies difficulty breathing, denies fever  Disposition: [] ED /[] Urgent Care (no appt availability in office) / [] Appointment(In office/virtual)/ []  [place] [facility]/ [x] Home Care/ [] Refused Recommended Disposition /[] [place] [facility]/ []  Follow-up with PCP  Additional Notes: Pt states that husband was recently sick with same s/s. Pt states that she is using mucinex  and cough syrup, both help with s/s. Pt states she was negative covid at home test. Pt given care advice, understands. Pt advised to call back if fever develops or SOB develops. Pt agreeable.   Copied from CRM [id number]. Topic: Clinical - Pink Word Triage >> Mar 22, 2023 11:25 AM Carmell SAUNDERS wrote: Reason for CRM: Patient caught cold from husband around Tuesday and she's sneezing, upset stomach and having bowel movements, coughing, ear's itching, eyes watering. Wondering if a medication can be sent for some prednisone  and something other than OTC meds. She is not sure if she can make it through the weekend. Office closed and would appreciate some assistance today. [phone] Reason for Disposition  Common cold with no complications  Answer Assessment - Initial Assessment Questions 1. ONSET: When did the nasal discharge start?      4 days ago 3. COUGH: Do you have a cough? If Yes, ask: Describe the color of your sputum (clear, white, yellow, green)     Yes, greenish 4. RESPIRATORY DISTRESS: Describe your breathing.      denies 5. FEVER: Do you have a fever? If Yes, ask: What is your temperature, how was it measured, and when did it start?     denies 6. SEVERITY: Overall, how bad are you feeling right now? (e.g., doesn't interfere with normal activities, staying home from school/work, staying in bed)      Keeping her at home 7. OTHER SYMPTOMS: Do you have any other  symptoms? (e.g., sore throat, earache, wheezing, vomiting)     Nausea, earache, sore throat, cough,  Protocols used: Common Cold-A-AH

## 2023-03-27 ENCOUNTER — Other Ambulatory Visit: Payer: Self-pay

## 2023-03-27 ENCOUNTER — Telehealth: Payer: Self-pay | Admitting: Family Medicine

## 2023-03-27 DIAGNOSIS — I10 Essential (primary) hypertension: Secondary | ICD-10-CM

## 2023-03-27 DIAGNOSIS — E1149 Type 2 diabetes mellitus with other diabetic neurological complication: Secondary | ICD-10-CM

## 2023-03-27 DIAGNOSIS — E785 Hyperlipidemia, unspecified: Secondary | ICD-10-CM

## 2023-03-27 DIAGNOSIS — E790 Hyperuricemia without signs of inflammatory arthritis and tophaceous disease: Secondary | ICD-10-CM

## 2023-03-27 NOTE — Telephone Encounter (Signed)
 Yes,  needs non fasting cmp and eGFR, CBC, uric acid, hBA1C   Also dexa was ordered 01/2023, still not done needs to be scheduled,  overdue

## 2023-03-27 NOTE — Telephone Encounter (Signed)
 Labs ordered.

## 2023-03-27 NOTE — Telephone Encounter (Signed)
 Patient called asking does she need to do blood work before her physical next week. Asking for a call back.

## 2023-04-01 ENCOUNTER — Other Ambulatory Visit: Payer: Self-pay | Admitting: Internal Medicine

## 2023-04-01 ENCOUNTER — Other Ambulatory Visit: Payer: Self-pay | Admitting: Family Medicine

## 2023-04-03 ENCOUNTER — Other Ambulatory Visit (HOSPITAL_COMMUNITY): Payer: Self-pay | Admitting: Family Medicine

## 2023-04-03 ENCOUNTER — Ambulatory Visit (INDEPENDENT_AMBULATORY_CARE_PROVIDER_SITE_OTHER): Payer: Medicare Other | Admitting: Family Medicine

## 2023-04-03 ENCOUNTER — Encounter: Payer: Self-pay | Admitting: Family Medicine

## 2023-04-03 VITALS — BP 120/77 | HR 66 | Temp 98.0°F | Resp 16 | Ht 60.0 in | Wt 185.1 lb

## 2023-04-03 DIAGNOSIS — J40 Bronchitis, not specified as acute or chronic: Secondary | ICD-10-CM | POA: Diagnosis not present

## 2023-04-03 DIAGNOSIS — Z1231 Encounter for screening mammogram for malignant neoplasm of breast: Secondary | ICD-10-CM

## 2023-04-03 DIAGNOSIS — Z0001 Encounter for general adult medical examination with abnormal findings: Secondary | ICD-10-CM | POA: Diagnosis not present

## 2023-04-03 MED ORDER — DOXYCYCLINE HYCLATE 100 MG PO TABS: 100.0000 mg | ORAL_TABLET | Freq: Two times a day (BID) | ORAL | 0 refills | Status: DC

## 2023-04-03 MED ORDER — BENZONATATE 200 MG PO CAPS: 200.0000 mg | ORAL_CAPSULE | Freq: Two times a day (BID) | ORAL | 0 refills | Status: DC | PRN

## 2023-04-03 NOTE — Progress Notes (Signed)
    Michaela Peterson     MRN: [id number]      DOB: 25-Nov-1955  Chief Complaint  Patient presents with   Annual Exam   Cough    X 2 weeks after her brothers funeral a couple weeks ago now producing green mucus and hurts in her chest when she tries to get it up    HPI: Patient is in for annual physical exam. 3 week h/o cough, had been having basal drainage, had chills Immunization is reviewed , and  updated if needed.   PE: BP 120/77   Pulse 66   Temp 98 F (36.7 C) (Oral)   Resp 16   Ht 5' (1.524 m)   Wt 185 lb 1.9 oz (84 kg)   LMP 08/18/2012   SpO2 95%   BMI 36.15 kg/m   Pleasant  female, alert and oriented x 3, in no cardio-pulmonary distress. Afebrile. HEENT No facial trauma or asymetry. Sinuses non tender.  Extra occullar muscles intact.. External ears normal, . Neck: supple, no adenopathy,JVD or thyromegaly.No bruits.  Chest: Decreased  air entry scattered crackles  no  wheezes. Non tender to palpation  Breast: Asymptomatic, not examined  Cardiovascular system; Heart sounds normal,  S1 and  S2 ,no S3.  No murmur, or thrill. Apical beat not displaced Peripheral pulses normal.  Abdomen: Soft, non tender, no organomegaly or masses. No bruits. Bowel sounds normal. No guarding, tenderness or rebound.      Musculoskeletal exam: Full ROM of spine, hips , shoulders and knees. No deformity ,swelling or crepitus noted. No muscle wasting or atrophy.   Neurologic: Cranial nerves 2 to 12 intact. Power, tone ,sensation and reflexes normal throughout. No disturbance in gait. No tremor.  Skin: Intact, no ulceration, erythema , scaling or rash noted. Pigmentation normal throughout  Psych; Normal mood and affect. Judgement and concentration normal   Assessment & Plan:  Encounter for Medicare annual examination with abnormal findings Annual exam as documented. Counseling done  re healthy lifestyle involving commitment to 150 minutes exercise per week,  heart healthy diet, and attaining healthy weight.The importance of adequate sleep also discussed. RChanges in health habits are decided on by the patient with goals and time frames  set for achieving them. Immunization and cancer screening needs are specifically addressed at this visit.   Bronchitis Doxycycline and tessalon perles prescribed shi is to caontact office if symptoms persist or fail to [employer]

## 2023-04-03 NOTE — Addendum Note (Signed)
Addended by: Abner Greenspan on: 04/03/2023 11:40 AM   Modules accepted: Orders

## 2023-04-03 NOTE — Patient Instructions (Signed)
F/U in 13 weeks, call if you need me sooner  Tessalon perles and doxycycline are prescribed for bronchitis  You will be contacted  with lab result tomorrow  Please schedule mammogram and dexa at checkout and contact pt  Thanks for choosing [facility], we consider it a privelige to serve you.

## 2023-04-05 ENCOUNTER — Ambulatory Visit: Payer: Medicare Other | Admitting: Internal Medicine

## 2023-04-05 VITALS — BP 106/54 | HR 41 | Temp 97.2°F | Ht 60.0 in | Wt 187.2 lb

## 2023-04-05 DIAGNOSIS — R159 Full incontinence of feces: Secondary | ICD-10-CM

## 2023-04-05 DIAGNOSIS — R197 Diarrhea, unspecified: Secondary | ICD-10-CM

## 2023-04-05 DIAGNOSIS — K219 Gastro-esophageal reflux disease without esophagitis: Secondary | ICD-10-CM

## 2023-04-05 DIAGNOSIS — Z860101 Personal history of adenomatous and serrated colon polyps: Secondary | ICD-10-CM

## 2023-04-05 DIAGNOSIS — Z860100 Personal history of colon polyps, unspecified: Secondary | ICD-10-CM

## 2023-04-05 NOTE — Progress Notes (Unsigned)
Primary Care Physician:  Kerri Perches, MD Primary Gastroenterologist:  Dr. Jena Gauss  Pre-Procedure History & Physical: HPI:  Michaela Peterson is a 68 y.o. female here for follow-up of intermittent diarrhea and episodes of incontinence Questran did help.  Insurance no longer provides a reasonable benefit.  Treated for bronchitis/sinusitis.  Coughing.  Lately when she coughs she tends to have a small bowel movement.  Unfortunately, had a funeral for her brother last week.  History of colonic adenoma due for surveillance colonoscopy this year.  Reflux symptoms well-controlled when she takes pantoprazole every day.  No dysphagia.  Past Medical History:  Diagnosis Date   Adnexal mass    Left; followed by Dr. Emelda Fear   Anemia, iron deficiency    Arthritis    Chronic low back pain    Diabetes mellitus, type 2 (HCC)    Essential hypertension    Gastroparesis    GERD (gastroesophageal reflux disease)    MS (multiple sclerosis) (HCC)    Nonalcoholic fatty liver disease    Obesity    Sleep apnea     Past Surgical History:  Procedure Laterality Date   AGILE CAPSULE N/A 08/11/2014   Procedure: AGILE CAPSULE;  Surgeon: Corbin Ade, MD;  [facility];  Service: Endoscopy;  Laterality: N/A;  0700   BALLOON DILATION N/A 03/07/2021   Procedure: BALLOON DILATION;  Surgeon: Lanelle Bal, DO;  [facility]: [facility];  Service: Endoscopy;  Laterality: N/A;   BIOPSY  03/07/2021   Procedure: BIOPSY;  Surgeon: Lanelle Bal, DO;  [facility];  Service: Endoscopy;;   BLADDER SUSPENSION     CESAREAN SECTION     x2    CHOLECYSTECTOMY     COLONOSCOPY  2010   Dr. Jena Gauss: tubular adenoma, few scattered diverticula   COLONOSCOPY N/A 10/01/2013   Dr. Thurston Pounds preparation. Normal rectum. Normal colonic mucosa   COLONOSCOPY N/A 11/12/2018   sigmoid and descending colon diverticulosis. Two 4-5 mm polyps in ascending colon. (tubular adenomas). Surveillance in 5 years    DILATION AND CURETTAGE OF UTERUS     4   ESOPHAGOGASTRODUODENOSCOPY  2010   Dr. Jena Gauss: normal esophagus, small hiatal hernia, questionable pale duodenal mucosa but negative for celiac sprue.    ESOPHAGOGASTRODUODENOSCOPY N/A 10/01/2013   Dr. Shell Blanchette:gastric polyps-status post biopsy. Otherwise, normal EGD. fundic gland polyp, negative H.pylori   ESOPHAGOGASTRODUODENOSCOPY (EGD) WITH PROPOFOL N/A 03/07/2021   Procedure: ESOPHAGOGASTRODUODENOSCOPY (EGD) WITH PROPOFOL;  Surgeon: Lanelle Bal, DO;  [facility];  Service: Endoscopy;  Laterality: N/A;  12:00pm   GIVENS CAPSULE STUDY N/A 09/08/2014   Poor prep but overall unremarkable    KNEE ARTHROSCOPY W/ DEBRIDEMENT     LAPAROSCOPY ABDOMEN DIAGNOSTIC     lysis of adhesions   POLYPECTOMY  11/12/2018   Procedure: POLYPECTOMY;  Surgeon: Corbin Ade, MD;  [facility];  Service: Endoscopy;;   ROTATOR CUFF REPAIR     TENDON LENGTHENING     Left wrist   UMBILICAL HERNIA REPAIR      Prior to Admission medications   Medication Sig Start Date End Date Taking? Authorizing Provider  allopurinol (ZYLOPRIM) 100 MG tablet Take 1 tablet (100 mg total) by mouth daily. 07/25/22  Yes Kerri Perches, MD  aspirin EC 81 MG tablet Take 81 mg by mouth daily.   Yes [provider]  benzonatate (TESSALON) 200 MG capsule Take 1 capsule (200 mg total) by mouth 2 (two)  times daily as needed for cough. 04/03/23  Yes Kerri Perches, MD  carvedilol (COREG) 6.25 MG tablet Take 6.25 mg by mouth 2 (two) times daily. 11/25/22  Yes [provider]  cetirizine (ZYRTEC) 10 MG tablet Take 1 tablet (10 mg total) by mouth daily. 01/04/22  Yes Kerri Perches, MD  doxycycline (VIBRA-TABS) 100 MG tablet Take 1 tablet (100 mg total) by mouth 2 (two) times daily. 04/03/23  Yes Kerri Perches, MD  fluticasone [facility]) 50 MCG/ACT nasal spray USE 2 SPRAYS IN BOTH NOSTRILS  DAILY 08/08/22  Yes Kerri Perches, MD  glipiZIDE  (GLUCOTROL) 10 MG tablet TAKE 1 TABLET BY MOUTH TWICE  DAILY BEFORE MEALS 03/07/23  Yes Kerri Perches, MD  glucose blood (ONETOUCH ULTRA) test strip TEST ONCE DAILY 12/14/22  Yes Kerri Perches, MD  Lancets [facility] DELICA PLUS [place]) MISC Test once daily DX e11.9 01/30/21  Yes Kerri Perches, MD  metFORMIN (GLUCOPHAGE) 1000 MG tablet TAKE 1 TABLET BY MOUTH TWICE  DAILY WITH MEALS 08/13/22  Yes Kerri Perches, MD  montelukast (SINGULAIR) 10 MG tablet TAKE 1 TABLET BY MOUTH AT  BEDTIME 08/13/22  Yes Kerri Perches, MD  Multiple Vitamin (MULTIVITAMIN) capsule Take 1 capsule by mouth daily.   Yes [provider]  NIFEdipine (ADALAT CC) 90 MG 24 hr tablet TAKE 1 TABLET BY MOUTH DAILY 08/13/22  Yes Kerri Perches, MD  olmesartan (BENICAR) 40 MG tablet TAKE 1 TABLET BY MOUTH DAILY 08/13/22  Yes Kerri Perches, MD  pantoprazole (PROTONIX) 40 MG tablet TAKE 1 TABLET BY MOUTH DAILY  BEFORE BREAKFAST 04/02/23  Yes Tisheena Maguire, Gerrit Friends, MD  RESTASIS 0.05 % ophthalmic emulsion 1 drop 2 (two) times daily. 02/28/22  Yes [provider]  rosuvastatin (CRESTOR) 5 MG tablet TAKE ONE TABLET BY MOUTH EVERY MONDAY AND FRIDAY 03/18/23  Yes Kerri Perches, MD  spironolactone (ALDACTONE) 100 MG tablet TAKE 1 TABLET BY MOUTH DAILY 04/01/23  Yes Kerri Perches, MD  cholestyramine Lanetta Inch) 4 g packet Take 4 g by mouth 3 (three) times daily with meals. Patient not taking: Reported on 04/05/2023    [provider]  Lancets [facility] ULTRASOFT) lancets Use as instructed 05/02/22   Kerri Perches, MD    Allergies as of 04/05/2023 - Review Complete 04/05/2023  Allergen Reaction Noted   Gabapentin Shortness Of Breath 02/13/2017   Ace inhibitors Cough 03/26/2012   Aspirin     Oxycodone Hives 04/01/2018   Penicillins Hives    Percocet [oxycodone-acetaminophen] Hives 03/03/2021   Rebif [interferon beta-1a] Other (See Comments) 05/01/2018   Statins Other (See  Comments) 10/11/2012    Family History  Problem Relation Age of Onset   Heart failure Mother    Hypertension Mother    Arthritis Mother    Stroke Father    Hypertension Sister    Diabetes Sister    Heart disease Sister    Kidney failure Sister    Hypertension Sister    Hypertension Brother    Mental illness Brother    Hypertension Son    Colon cancer Maternal Aunt     Social History   Socioeconomic History   Marital status: Married    Spouse name: Simona Huh    Number of children: 4   Years of education: 13   Highest education level: Some college, no degree  Occupational History   Occupation: unemployed     Associate Professor: UNEMPLOYED  Tobacco Use   Smoking status:  Never   Smokeless tobacco: Never   Tobacco comments:    patient lives with a smoker   Vaping Use   Vaping status: Never Used  Substance and Sexual Activity   Alcohol use: No   Drug use: No   Sexual activity: Yes    Birth control/protection: Post-menopausal  Other Topics Concern   Not on file  Social History Narrative   Patient lives at home with husband Simona Huh.    Patient has 4 children.    Patient has a some college.    Patient is right handed.    Patient is currently not working.    Social Drivers of Corporate investment banker Strain: Low Risk  (03/28/2023)   Overall Financial Resource Strain (CARDIA)    Difficulty of Paying Living Expenses: Not hard at all  Food Insecurity: No Food Insecurity (03/28/2023)   Hunger Vital Sign    Worried About Running Out of Food in the Last Year: Never true    Ran Out of Food in the Last Year: Never true  Transportation Needs: No Transportation Needs (03/28/2023)   PRAPARE - Administrator, Civil Service (Medical): No    Lack of Transportation (Non-Medical): No  Physical Activity: Unknown (03/28/2023)   Exercise Vital Sign    Days of Exercise per Week: 0 days    Minutes of Exercise per Session: Patient declined  Stress: No Stress Concern Present (03/28/2023)    [employer] of Occupational Health - Occupational Stress Questionnaire    Feeling of Stress : Not at all  Social Connections: Socially Integrated (03/28/2023)   Social Connection and Isolation Panel [NHANES]    Frequency of Communication with Friends and Family: More than three times a week    Frequency of Social Gatherings with Friends and Family: More than three times a week    Attends Religious Services: More than 4 times per year    Active Member of [employer] or Organizations: Yes    Attends Engineer, structural: More than 4 times per year    Marital Status: Married  Catering manager Violence: Not At Risk (02/06/2023)   Humiliation, Afraid, Rape, and Kick questionnaire    Fear of Current or Ex-Partner: No    Emotionally Abused: No    Physically Abused: No    Sexually Abused: No    Review of Systems: See HPI, otherwise negative ROS  Physical Exam: BP (!) 106/54 (BP Location: Right Arm, Patient Position: Sitting)   Pulse (!) 41   Temp (!) 97.2 F (36.2 C) (Temporal)   Ht 5' (1.524 m)   Wt 187 lb 3.2 oz (84.9 kg)   LMP 08/18/2012   BMI 36.56 kg/m  General:   Alert,  Well-developed, well-nourished, pleasant and cooperative in NAD Neck:  Supple; no masses or thyromegaly. No significant cervical adenopathy. Lungs:  Clear throughout to auscultation.   No wheezes, crackles, or rhonchi. No acute distress. Heart:  Regular rate and rhythm; no murmurs, clicks, rubs,  or gallops. Abdomen: Non-distended, normal bowel sounds.  Soft and nontender without appreciable mass or hepatosplenomegaly.   Impression/Plan: Very pleasant 68 year old lady with longstanding GERD well-controlled on pantoprazole when she takes it.  I emphasized taking this medication daily because GERD is a chronic disease.  Intermittent episodes of fecal incontinence.  Better with resin binder therapy but now not easily obtained.  Next best approach would be provide judicious use of  antimotility/antidiarrhea therapy.  History of colonic adenoma; due for surveillance colonoscopy this year.  Recommendations:  For now we will hold off on Questran  New prescription for dicyclomine or Bentyl 20 mg tablet take 1 in the morning and 1 in the evening to decrease episodes of loose stools and incontinence.  Dispense 60 with 11 refills  Best to take pantoprazole even up to an hour before you eat a meal  - this will work better for you as discussed  Plan for a surveillance colonoscopy in the near future.  History of colon polyps ASA 3.  The risks, benefits, limitations, alternatives and imponderables have been reviewed with the patient. Questions have been answered. All parties are agreeable.    3 days prior to colonoscopy stop taking the dicyclomine or Bentyl.  Office visit in 3 months.      Notice: This dictation was prepared with Dragon dictation along with smaller phrase technology. Any transcriptional errors that result from this process are unintentional and may not be corrected upon review.

## 2023-04-05 NOTE — Patient Instructions (Signed)
It was good to see you again today  For now we will hold off on Questran  New prescription for dicyclomine or Bentyl 20 mg tablet take 1 in the morning and 1 in the evening to decrease episodes of loose stools and incontinence.  Dispense 60 with 11 refills  Best to take pantoprazole even up to an hour before you eat a meal  - this will work better for you as discussed  Plan for a surveillance colonoscopy in the near future.  History of colon polyps ASA 3  3 days prior to colonoscopy stop taking the dicyclomine or Bentyl.  Office visit in 3 months.

## 2023-04-08 ENCOUNTER — Other Ambulatory Visit: Payer: Self-pay

## 2023-04-08 MED ORDER — DICYCLOMINE HCL 20 MG PO TABS: 20.0000 mg | ORAL_TABLET | Freq: Two times a day (BID) | ORAL | 11 refills | Status: DC

## 2023-04-09 ENCOUNTER — Encounter: Payer: Self-pay | Admitting: *Deleted

## 2023-04-09 ENCOUNTER — Other Ambulatory Visit: Payer: Self-pay | Admitting: *Deleted

## 2023-04-09 MED ORDER — PEG 3350-KCL-NA BICARB-NACL 420 G PO SOLR: 4000.0000 mL | Freq: Once | ORAL | 0 refills | Status: AC

## 2023-04-16 ENCOUNTER — Encounter: Payer: Self-pay | Admitting: Family Medicine

## 2023-04-16 ENCOUNTER — Ambulatory Visit (HOSPITAL_COMMUNITY)
Admission: RE | Admit: 2023-04-16 | Discharge: 2023-04-16 | Disposition: A | Discharge status: Home / Self Care | Payer: Medicare Other | Source: Ambulatory Visit | Attending: Family Medicine | Admitting: Family Medicine

## 2023-04-16 DIAGNOSIS — M85852 Other specified disorders of bone density and structure, left thigh: Secondary | ICD-10-CM | POA: Diagnosis not present

## 2023-04-16 DIAGNOSIS — Z0000 Encounter for general adult medical examination without abnormal findings: Secondary | ICD-10-CM | POA: Insufficient documentation

## 2023-04-16 DIAGNOSIS — Z780 Asymptomatic menopausal state: Secondary | ICD-10-CM | POA: Insufficient documentation

## 2023-04-18 DIAGNOSIS — M79674 Pain in right toe(s): Secondary | ICD-10-CM | POA: Diagnosis not present

## 2023-04-18 DIAGNOSIS — E1140 Type 2 diabetes mellitus with diabetic neuropathy, unspecified: Secondary | ICD-10-CM | POA: Diagnosis not present

## 2023-04-18 DIAGNOSIS — L110 Acquired keratosis follicularis: Secondary | ICD-10-CM | POA: Diagnosis not present

## 2023-04-18 DIAGNOSIS — M79675 Pain in left toe(s): Secondary | ICD-10-CM | POA: Diagnosis not present

## 2023-04-18 DIAGNOSIS — I739 Peripheral vascular disease, unspecified: Secondary | ICD-10-CM | POA: Diagnosis not present

## 2023-04-18 DIAGNOSIS — M79672 Pain in left foot: Secondary | ICD-10-CM | POA: Diagnosis not present

## 2023-04-18 DIAGNOSIS — M79671 Pain in right foot: Secondary | ICD-10-CM | POA: Diagnosis not present

## 2023-04-24 DIAGNOSIS — M5416 Radiculopathy, lumbar region: Secondary | ICD-10-CM | POA: Diagnosis not present

## 2023-05-02 DIAGNOSIS — J40 Bronchitis, not specified as acute or chronic: Secondary | ICD-10-CM | POA: Insufficient documentation

## 2023-05-02 DIAGNOSIS — Z0001 Encounter for general adult medical examination with abnormal findings: Secondary | ICD-10-CM | POA: Insufficient documentation

## 2023-05-02 NOTE — Assessment & Plan Note (Signed)
Annual exam as documented. Counseling done  re healthy lifestyle involving commitment to 150 minutes exercise per week, heart healthy diet, and attaining healthy weight.The importance of adequate sleep also discussed. RChanges in health habits are decided on by the patient with goals and time frames  set for achieving them. Immunization and cancer screening needs are specifically addressed at this visit.

## 2023-05-02 NOTE — Assessment & Plan Note (Signed)
Doxycycline and tessalon perles prescribed shi is to caontact office if symptoms persist or fail to [employer]

## 2023-05-08 ENCOUNTER — Other Ambulatory Visit: Payer: Self-pay | Admitting: Family Medicine

## 2023-05-08 DIAGNOSIS — I10 Essential (primary) hypertension: Secondary | ICD-10-CM | POA: Diagnosis not present

## 2023-05-08 DIAGNOSIS — E1149 Type 2 diabetes mellitus with other diabetic neurological complication: Secondary | ICD-10-CM | POA: Diagnosis not present

## 2023-05-08 DIAGNOSIS — E790 Hyperuricemia without signs of inflammatory arthritis and tophaceous disease: Secondary | ICD-10-CM | POA: Diagnosis not present

## 2023-05-08 DIAGNOSIS — G4761 Periodic limb movement disorder: Secondary | ICD-10-CM

## 2023-05-09 DIAGNOSIS — M5416 Radiculopathy, lumbar region: Secondary | ICD-10-CM | POA: Diagnosis not present

## 2023-05-09 LAB — CMP14+EGFR
ALT: 17 [IU]/L (ref 0–32)
AST: 17 [IU]/L (ref 0–40)
Albumin: 4.6 g/dL (ref 3.9–4.9)
Alkaline Phosphatase: 111 [IU]/L (ref 44–121)
BUN/Creatinine Ratio: 14 (ref 12–28)
BUN: 20 mg/dL (ref 8–27)
Bilirubin Total: 0.3 mg/dL (ref 0.0–1.2)
CO2: 19 mmol/L — ABNORMAL LOW (ref 20–29)
Calcium: 10.1 mg/dL (ref 8.7–10.3)
Chloride: 109 mmol/L — ABNORMAL HIGH (ref 96–106)
Creatinine, Ser: 1.4 mg/dL — ABNORMAL HIGH (ref 0.57–1.00)
Globulin, Total: 2.9 g/dL (ref 1.5–4.5)
Glucose: 72 mg/dL (ref 70–99)
Potassium: 5.4 mmol/L — ABNORMAL HIGH (ref 3.5–5.2)
Sodium: 145 mmol/L — ABNORMAL HIGH (ref 134–144)
Total Protein: 7.5 g/dL (ref 6.0–8.5)
eGFR: 41 mL/min/{1.73_m2} — ABNORMAL LOW (ref >59)

## 2023-05-09 LAB — CBC WITH DIFFERENTIAL/PLATELET
Basophils Absolute: 0.1 10*3/uL (ref 0.0–0.2)
Basos: 1 %
EOS (ABSOLUTE): 0.1 10*3/uL (ref 0.0–0.4)
Eos: 1 %
Hematocrit: 35.2 % (ref 34.0–46.6)
Hemoglobin: 11.4 g/dL (ref 11.1–15.9)
Immature Grans (Abs): 0 10*3/uL (ref 0.0–0.1)
Immature Granulocytes: 0 %
Lymphocytes Absolute: 3.1 10*3/uL (ref 0.7–3.1)
Lymphs: 34 %
MCH: 29.6 pg (ref 26.6–33.0)
MCHC: 32.4 g/dL (ref 31.5–35.7)
MCV: 91 fL (ref 79–97)
Monocytes Absolute: 0.4 10*3/uL (ref 0.1–0.9)
Monocytes: 4 %
Neutrophils Absolute: 5.5 10*3/uL (ref 1.4–7.0)
Neutrophils: 60 %
Platelets: 394 10*3/uL (ref 150–450)
RBC: 3.85 x10E6/uL (ref 3.77–5.28)
RDW: 14.8 % (ref 11.7–15.4)
WBC: 9.2 10*3/uL (ref 3.4–10.8)

## 2023-05-09 LAB — HEMOGLOBIN A1C
Est. average glucose Bld gHb Est-mCnc: 151 mg/dL
Hgb A1c MFr Bld: 6.9 % — ABNORMAL HIGH (ref 4.8–5.6)

## 2023-05-09 LAB — URIC ACID: Uric Acid: 9.2 mg/dL — ABNORMAL HIGH (ref 3.0–7.2)

## 2023-05-12 MED ORDER — ALLOPURINOL 100 MG PO TABS: 100.0000 mg | ORAL_TABLET | Freq: Every day | ORAL | 3 refills | Status: DC

## 2023-05-15 NOTE — Patient Instructions (Signed)
 Michaela Peterson  05/15/2023     @PREFPERIOPPHARMACY @   Your procedure is scheduled on  05/22/2023.   [facility] to [facility] at  0715 A.M.   Call this number if you have problems the morning of surgery:  [phone]  If you experience any cold or flu symptoms such as cough, fever, chills, shortness of breath, etc. between now and your scheduled surgery, please notify us at the above number.   Remember:        DO NOT take any medications for diabetes the morning of your procedure.   Follow the diet and prep instructions given to you by the office.   You may drink clear liquids until 0515 am on 05/22/2023.    Clear liquids allowed are:                    Water, Juice (No red color; non-citric and without pulp; diabetics please choose diet or no sugar options), Carbonated beverages (diabetics please choose diet or no sugar options), Clear Tea (No creamer, milk, or cream, including half & half and powdered creamer), Black Coffee Only (No creamer, milk or cream, including half & half and powdered creamer), and Clear Sports drink (No red color; diabetics please choose diet or no sugar options)    Take these medicines the morning of surgery with A SIP OF WATER         allopurinol, carvedilol, nifedipine, pantoprazole.     Do not wear jewelry, make-up or nail polish, including gel polish,  artificial nails, or any other type of covering on natural nails (fingers and  toes).  Do not wear lotions, powders, or perfumes, or deodorant.  Do not shave 48 hours prior to surgery.  Men may shave face and neck.  Do not bring valuables to the hospital.  [facility] is not responsible for any belongings or valuables.  Contacts, dentures or bridgework may not be worn into surgery.  Leave your suitcase in the car.  After surgery it may be brought to your room.  For patients admitted to the hospital, discharge time will be determined by your treatment team.  Patients discharged the day of surgery  will not be allowed to drive home and must have someone with them for 24 hours.    Special instructions:   DO NOT smoke tobacco or vape for 24 hours before your procedure.  Please read over the following fact sheets that you were given. Anesthesia Post-op Instructions and Care and Recovery After Surgery      Colonoscopy, Adult, Care After The following information offers guidance on how to care for yourself after your procedure. Your health care provider may also give you more specific instructions. If you have problems or questions, contact your health care provider. What can I expect after the procedure? After the procedure, it is common to have: A small amount of blood in your stool for 24 hours after the procedure. Some gas. Mild cramping or bloating of your abdomen. Follow these instructions at home: Eating and drinking  Drink enough fluid to keep your urine pale yellow. Follow instructions from your health care provider about eating or drinking restrictions. Resume your normal diet as told by your health care provider. Avoid heavy or fried foods that are hard to digest. Activity Rest as told by your health care provider. Avoid sitting for a long time without moving. Get up to take short walks every 1-2 hours. This is important to  improve blood flow and breathing. Ask for help if you feel weak or unsteady. Return to your normal activities as told by your health care provider. Ask your health care provider what activities are safe for you. Managing cramping and bloating  Try walking around when you have cramps or feel bloated. If directed, apply heat to your abdomen as told by your health care provider. Use the heat source that your health care provider recommends, such as a moist heat pack or a heating pad. Place a towel between your skin and the heat source. Leave the heat on for 20-30 minutes. Remove the heat if your skin turns bright red. This is especially important if you  are unable to feel pain, heat, or cold. You have a greater risk of getting burned. General instructions If you were given a sedative during the procedure, it can affect you for several hours. Do not drive or operate machinery until your health care provider says that it is safe. For the first 24 hours after the procedure: Do not sign important documents. Do not drink alcohol. Do your regular daily activities at a slower pace than normal. Eat soft foods that are easy to digest. Take over-the-counter and prescription medicines only as told by your health care provider. Keep all follow-up visits. This is important. Contact a health care provider if: You have blood in your stool 2-3 days after the procedure. Get help right away if: You have more than a small spotting of blood in your stool. You have large blood clots in your stool. You have swelling of your abdomen. You have nausea or vomiting. You have a fever. You have increasing pain in your abdomen that is not relieved with medicine. These symptoms may be an emergency. Get help right away. Call 911. Do not wait to see if the symptoms will go away. Do not drive yourself to the hospital. Summary After the procedure, it is common to have a small amount of blood in your stool. You may also have mild cramping and bloating of your abdomen. If you were given a sedative during the procedure, it can affect you for several hours. Do not drive or operate machinery until your health care provider says that it is safe. Get help right away if you have a lot of blood in your stool, nausea or vomiting, a fever, or increased pain in your abdomen. This information is not intended to replace advice given to you by your health care provider. Make sure you discuss any questions you have with your health care provider. Document Revised: 04/10/2022 Document Reviewed: 10/19/2020 Elsevier Patient Education  2024 Elsevier Inc.General Anesthesia, Adult, Care  After The following information offers guidance on how to care for yourself after your procedure. Your health care provider may also give you more specific instructions. If you have problems or questions, contact your health care provider. What can I expect after the procedure? After the procedure, it is common for people to: Have pain or discomfort at the IV site. Have nausea or vomiting. Have a sore throat or hoarseness. Have trouble concentrating. Feel cold or chills. Feel weak, sleepy, or tired (fatigue). Have soreness and body aches. These can affect parts of the body that were not involved in surgery. Follow these instructions at home: For the time period you were told by your health care provider:  Rest. Do not participate in activities where you could fall or become injured. Do not drive or use machinery. Do not drink alcohol. Do  not take sleeping pills or medicines that cause drowsiness. Do not make important decisions or sign legal documents. Do not take care of children on your own. General instructions Drink enough fluid to keep your urine pale yellow. If you have sleep apnea, surgery and certain medicines can increase your risk for breathing problems. Follow instructions from your health care provider about wearing your sleep device: Anytime you are sleeping, including during daytime naps. While taking prescription pain medicines, sleeping medicines, or medicines that make you drowsy. Return to your normal activities as told by your health care provider. Ask your health care provider what activities are safe for you. Take over-the-counter and prescription medicines only as told by your health care provider. Do not use any products that contain nicotine or tobacco. These products include cigarettes, chewing tobacco, and vaping devices, such as e-cigarettes. These can delay incision healing after surgery. If you need help quitting, ask your health care provider. Contact a  health care provider if: You have nausea or vomiting that does not get better with medicine. You vomit every time you eat or drink. You have pain that does not get better with medicine. You cannot urinate or have bloody urine. You develop a skin rash. You have a fever. Get help right away if: You have trouble breathing. You have chest pain. You vomit blood. These symptoms may be an emergency. Get help right away. Call 911. Do not wait to see if the symptoms will go away. Do not drive yourself to the hospital. Summary After the procedure, it is common to have a sore throat, hoarseness, nausea, vomiting, or to feel weak, sleepy, or fatigue. For the time period you were told by your health care provider, do not drive or use machinery. Get help right away if you have difficulty breathing, have chest pain, or vomit blood. These symptoms may be an emergency. This information is not intended to replace advice given to you by your health care provider. Make sure you discuss any questions you have with your health care provider. Document Revised: 05/26/2021 Document Reviewed: 05/26/2021 Elsevier Patient Education  2024 [employer].

## 2023-05-17 ENCOUNTER — Encounter (HOSPITAL_COMMUNITY)
Admission: RE | Admit: 2023-05-17 | Discharge: 2023-05-17 | Disposition: A | Discharge status: Home / Self Care | Payer: Medicare Other | Source: Ambulatory Visit | Attending: Internal Medicine | Admitting: Internal Medicine

## 2023-05-17 DIAGNOSIS — Z01818 Encounter for other preprocedural examination: Secondary | ICD-10-CM | POA: Insufficient documentation

## 2023-05-17 DIAGNOSIS — E1149 Type 2 diabetes mellitus with other diabetic neurological complication: Secondary | ICD-10-CM | POA: Insufficient documentation

## 2023-05-17 LAB — BASIC METABOLIC PANEL
Anion gap: 11 (ref 5–15)
BUN: 35 mg/dL — ABNORMAL HIGH (ref 8–23)
CO2: 18 mmol/L — ABNORMAL LOW (ref 22–32)
Calcium: 9.8 mg/dL (ref 8.9–10.3)
Chloride: 110 mmol/L (ref 98–111)
Creatinine, Ser: 1.34 mg/dL — ABNORMAL HIGH (ref 0.44–1.00)
GFR, Estimated: 43 mL/min — ABNORMAL LOW (ref >60)
Glucose, Bld: 95 mg/dL (ref 70–99)
Potassium: 4.4 mmol/L (ref 3.5–5.1)
Sodium: 139 mmol/L (ref 135–145)

## 2023-05-21 ENCOUNTER — Encounter: Payer: Self-pay | Admitting: *Deleted

## 2023-05-22 ENCOUNTER — Encounter (HOSPITAL_COMMUNITY): Payer: Self-pay | Admitting: Internal Medicine

## 2023-05-22 ENCOUNTER — Encounter (HOSPITAL_COMMUNITY)
Admission: RE | Disposition: A | Discharge status: Home / Self Care | Payer: Self-pay | Source: Home / Self Care | Attending: Internal Medicine

## 2023-05-22 ENCOUNTER — Ambulatory Visit (HOSPITAL_COMMUNITY): Admitting: Anesthesiology

## 2023-05-22 ENCOUNTER — Ambulatory Visit (HOSPITAL_COMMUNITY)
Admission: RE | Admit: 2023-05-22 | Discharge: 2023-05-22 | Disposition: A | Discharge status: Home / Self Care | Payer: Medicare Other | Attending: Internal Medicine | Admitting: Internal Medicine

## 2023-05-22 DIAGNOSIS — Z1211 Encounter for screening for malignant neoplasm of colon: Secondary | ICD-10-CM | POA: Insufficient documentation

## 2023-05-22 DIAGNOSIS — Z860100 Personal history of colon polyps, unspecified: Secondary | ICD-10-CM

## 2023-05-22 DIAGNOSIS — K3184 Gastroparesis: Secondary | ICD-10-CM | POA: Diagnosis not present

## 2023-05-22 DIAGNOSIS — K219 Gastro-esophageal reflux disease without esophagitis: Secondary | ICD-10-CM | POA: Diagnosis not present

## 2023-05-22 DIAGNOSIS — E1143 Type 2 diabetes mellitus with diabetic autonomic (poly)neuropathy: Secondary | ICD-10-CM | POA: Insufficient documentation

## 2023-05-22 DIAGNOSIS — Z7984 Long term (current) use of oral hypoglycemic drugs: Secondary | ICD-10-CM | POA: Diagnosis not present

## 2023-05-22 DIAGNOSIS — G4730 Sleep apnea, unspecified: Secondary | ICD-10-CM | POA: Insufficient documentation

## 2023-05-22 DIAGNOSIS — E119 Type 2 diabetes mellitus without complications: Secondary | ICD-10-CM

## 2023-05-22 DIAGNOSIS — K6389 Other specified diseases of intestine: Secondary | ICD-10-CM

## 2023-05-22 DIAGNOSIS — R197 Diarrhea, unspecified: Secondary | ICD-10-CM | POA: Diagnosis not present

## 2023-05-22 DIAGNOSIS — I10 Essential (primary) hypertension: Secondary | ICD-10-CM

## 2023-05-22 HISTORY — PX: COLONOSCOPY: SHX5424

## 2023-05-22 LAB — GLUCOSE, CAPILLARY: Glucose-Capillary: 160 mg/dL — ABNORMAL HIGH (ref 70–99)

## 2023-05-22 SURGERY — COLONOSCOPY
Anesthesia: General

## 2023-05-22 MED ORDER — LIDOCAINE HCL (PF) 2 % IJ SOLN
INTRAMUSCULAR | Status: DC | PRN
  Administered 2023-05-22: 100 mg via INTRADERMAL

## 2023-05-22 MED ORDER — LACTATED RINGERS IV SOLN: INTRAVENOUS | Status: DC

## 2023-05-22 MED ORDER — PROPOFOL 10 MG/ML IV BOLUS
INTRAVENOUS | Status: DC | PRN
  Administered 2023-05-22 (×2): 50 mg via INTRAVENOUS
  Administered 2023-05-22: 100 mg via INTRAVENOUS

## 2023-05-22 MED ORDER — GLYCOPYRROLATE PF 0.2 MG/ML IJ SOSY
PREFILLED_SYRINGE | INTRAMUSCULAR | Status: DC | PRN
  Administered 2023-05-22: .2 mg via INTRAVENOUS

## 2023-05-22 NOTE — Discharge Instructions (Addendum)
  Colonoscopy Discharge Instructions  Read the instructions outlined below and refer to this sheet in the next few weeks. These discharge instructions provide you with general information on caring for yourself after you leave the hospital. Your doctor may also give you specific instructions. While your treatment has been planned according to the most current medical practices available, unavoidable complications occasionally occur. If you have any problems or questions after discharge, call Dr. Jena Gauss at [phone]. ACTIVITY You may resume your regular activity, but move at a slower pace for the next 24 hours.  Take frequent rest periods for the next 24 hours.  Walking will help get rid of the air and reduce the bloated feeling in your belly (abdomen).  No driving for 24 hours (because of the medicine (anesthesia) used during the test).   Do not sign any important legal documents or operate any machinery for 24 hours (because of the anesthesia used during the test).  NUTRITION Drink plenty of fluids.  You may resume your normal diet as instructed by your doctor.  Begin with a light meal and progress to your normal diet. Heavy or fried foods are harder to digest and may make you feel sick to your stomach (nauseated).  Avoid alcoholic beverages for 24 hours or as instructed.  MEDICATIONS You may resume your normal medications unless your doctor tells you otherwise.  WHAT YOU CAN EXPECT TODAY Some feelings of bloating in the abdomen.  Passage of more gas than usual.  Spotting of blood in your stool or on the toilet paper.  IF YOU HAD POLYPS REMOVED DURING THE COLONOSCOPY: No aspirin products for 7 days or as instructed.  No alcohol for 7 days or as instructed.  Eat a soft diet for the next 24 hours.  FINDING OUT THE RESULTS OF YOUR TEST Not all test results are available during your visit. If your test results are not back during the visit, make an appointment with your caregiver to find out the  results. Do not assume everything is normal if you have not heard from your caregiver or the medical facility. It is important for you to follow up on all of your test results.  SEEK IMMEDIATE MEDICAL ATTENTION IF: You have more than a spotting of blood in your stool.  Your belly is swollen (abdominal distention).  You are nauseated or vomiting.  You have a temperature over 101.  You have abdominal pain or discomfort that is severe or gets worse throughout the day.     No polyps found today!  Samples of your colon lining were taken to evaluate your loose bowels   further recommendations to follow pending review of pathology report  Office visit with [place] in 3 months   repeat colonoscopy in 7 years.  At patient request, I called Ms. Clinton Sawyer at [phone] findings and recommendations

## 2023-05-22 NOTE — Op Note (Signed)
 [facility] Patient Name: Michaela Peterson Procedure Date: 05/22/2023 8:51 AM MRN: [id number] Date of Birth: 05-22-1955 Attending MD: Gennette Pac , MD, [id number] CSN: [id number] Age: 68 Admit Type: Outpatient Procedure:                Colonoscopy Indications:              High risk colon cancer surveillance: Personal                            history of colonic polyps; loose stools Providers:                Gennette Pac, MD, Nena Polio, RN, Dyann Ruddle Referring MD:              Medicines:                Propofol per Anesthesia Complications:            No immediate complications. Estimated Blood Loss:     Estimated blood loss: Minimal Procedure:                Pre-Anesthesia Assessment:                           - Prior to the procedure, a History and Physical                            was performed, and patient medications and                            allergies were reviewed. The patient's tolerance of                            previous anesthesia was also reviewed. The risks                            and benefits of the procedure and the sedation                            options and risks were discussed with the patient.                            All questions were answered, and informed consent                            was obtained. Prior Anticoagulants: The patient has                            taken no anticoagulant or antiplatelet agents. ASA                            Grade Assessment: III - A patient with severe  systemic disease. After reviewing the risks and                            benefits, the patient was deemed in satisfactory                            condition to undergo the procedure.                           After obtaining informed consent, the colonoscope                            was passed under direct vision. Throughout the                            procedure, the  patient's blood pressure, pulse, and                            oxygen saturations were monitored continuously. The                            [id number]) scope was introduced through the                            anus and advanced to the the cecum, identified by                            appendiceal orifice and ileocecal valve. The                            colonoscopy was performed without difficulty. The                            patient tolerated the procedure well. The quality                            of the bowel preparation was adequate. The                            ileocecal valve, appendiceal orifice, and rectum                            were photographed. Scope In: 9:19:21 AM Scope Out: 9:34:24 AM Scope Withdrawal Time: 0 hours 10 minutes 52 seconds  Total Procedure Duration: 0 hours 15 minutes 3 seconds  Findings:      The perianal and digital rectal examinations were normal.      The colon (entire examined portion) appeared normal. Segmental biopsies       right left colon taken for histologic study      The retroflexed view of the distal rectum and anal verge was normal and       showed no anal or rectal abnormalities. Impression:               - The entire examined colon is normal.                           -  The distal rectum and anal verge are normal on                            retroflexion view.                           -Status post segmental biopsy Moderate Sedation:      Moderate (conscious) sedation was personally administered by an       anesthesia professional. The following parameters were monitored: oxygen       saturation, heart rate, blood pressure, respiratory rate, EKG, adequacy       of pulmonary ventilation, and response to care. Recommendation:           - Patient has a contact number available for                            emergencies. The signs and symptoms of potential                            delayed complications were discussed  with the                            patient. Return to normal activities tomorrow.                            Written discharge instructions were provided to the                            patient.                           - Advance diet as tolerated. Follow-up on                            pathology. Repeat colonoscopy for surveillance                            purposes in 7 years. Office visit with [place] in 3                            months Procedure Code(s):        --- Professional ---                           [phone], Colonoscopy, flexible; diagnostic, including                            collection of specimen(s) by brushing or washing,                            when performed (separate procedure) Diagnosis Code(s):        --- Professional ---                           Z86.010, Personal history of colonic polyps CPT copyright 2022 American Medical Association. All rights reserved. The codes  documented in this report are preliminary and upon coder review may  be revised to meet current compliance requirements. Gerrit Friends. Tumeka Chimenti, MD Gennette Pac, MD 05/22/2023 9:44:52 AM This report has been signed electronically. Number of Addenda: 0

## 2023-05-22 NOTE — Transfer of Care (Signed)
 Immediate Anesthesia Transfer of Care Note  Patient: Michaela Peterson  Procedure(s) Performed: COLONOSCOPY WITH PROPOFOL BRONCHOSCOPY, WITH BIOPSY  Patient Location: Endoscopy Unit  Anesthesia Type:General  Level of Consciousness: awake  Airway & Oxygen Therapy: Patient Spontanous Breathing  Post-op Assessment: Report given to RN and Post -op Vital signs reviewed and stable  Post vital signs: Reviewed and stable  Last Vitals:  Vitals Value Taken Time  BP    Temp    Pulse    Resp    SpO2      Last Pain:  Vitals:   05/22/23 0747  TempSrc: Oral  PainSc: 3       Patients Stated Pain Goal: 6 (05/22/23 0747)  Complications: No notable events documented.

## 2023-05-22 NOTE — H&P (Signed)
 @LOGO @   Primary Care Physician:  Kerri Perches, MD Primary Gastroenterologist:  Dr. Jena Gauss  Pre-Procedure History & Physical: HPI:  Michaela Peterson is a 68 y.o. female here for surveillance colonoscopy.  History colonic adenoma removed 2020.  Also has had chronically loose bowel movements.  Past Medical History:  Diagnosis Date   Adnexal mass    Left; followed by Dr. Emelda Fear   Anemia, iron deficiency    Arthritis    Chronic low back pain    Diabetes mellitus, type 2 (HCC)    Essential hypertension    Gastroparesis    GERD (gastroesophageal reflux disease)    MS (multiple sclerosis) (HCC)    Nonalcoholic fatty liver disease    Obesity    Sleep apnea     Past Surgical History:  Procedure Laterality Date   AGILE CAPSULE N/A 08/11/2014   Procedure: AGILE CAPSULE;  Surgeon: Corbin Ade, MD;  Location: [facility];  Service: Endoscopy;  Laterality: N/A;  0700   BALLOON DILATION N/A 03/07/2021   Procedure: BALLOON DILATION;  Surgeon: Lanelle Bal, DO;  Location: [facility];  Service: Endoscopy;  Laterality: N/A;   BIOPSY  03/07/2021   Procedure: BIOPSY;  Surgeon: Lanelle Bal, DO;  Location: [facility];  Service: Endoscopy;;   BLADDER SUSPENSION     CESAREAN SECTION     x2    CHOLECYSTECTOMY     COLONOSCOPY  2010   Dr. Jena Gauss: tubular adenoma, few scattered diverticula   COLONOSCOPY N/A 10/01/2013   Dr. Thurston Pounds preparation. Normal rectum. Normal colonic mucosa   COLONOSCOPY N/A 11/12/2018   sigmoid and descending colon diverticulosis. Two 4-5 mm polyps in ascending colon. (tubular adenomas). Surveillance in 5 years   DILATION AND CURETTAGE OF UTERUS     4   ESOPHAGOGASTRODUODENOSCOPY  2010   Dr. Jena Gauss: normal esophagus, small hiatal hernia, questionable pale duodenal mucosa but negative for celiac sprue.    ESOPHAGOGASTRODUODENOSCOPY N/A 10/01/2013   Dr. Edwyna Dangerfield:gastric polyps-status post biopsy. Otherwise, normal EGD. fundic gland polyp, negative  H.pylori   ESOPHAGOGASTRODUODENOSCOPY (EGD) WITH PROPOFOL N/A 03/07/2021   Procedure: ESOPHAGOGASTRODUODENOSCOPY (EGD) WITH PROPOFOL;  Surgeon: Lanelle Bal, DO;  Location: [facility];  Service: Endoscopy;  Laterality: N/A;  12:00pm   GIVENS CAPSULE STUDY N/A 09/08/2014   Poor prep but overall unremarkable    KNEE ARTHROSCOPY W/ DEBRIDEMENT     LAPAROSCOPY ABDOMEN DIAGNOSTIC     lysis of adhesions   POLYPECTOMY  11/12/2018   Procedure: POLYPECTOMY;  Surgeon: Corbin Ade, MD;  Location: [facility];  Service: Endoscopy;;   ROTATOR CUFF REPAIR     TENDON LENGTHENING     Left wrist   UMBILICAL HERNIA REPAIR      Prior to Admission medications   Medication Sig Start Date End Date Taking? Authorizing Provider  allopurinol (ZYLOPRIM) 100 MG tablet Take 1 tablet (100 mg total) by mouth daily. 05/12/23  Yes Kerri Perches, MD  aspirin EC 81 MG tablet Take 81 mg by mouth daily.   Yes [provider]  benzonatate (TESSALON) 200 MG capsule Take 1 capsule (200 mg total) by mouth 2 (two) times daily as needed for cough. 04/03/23  Yes Kerri Perches, MD  carvedilol (COREG) 6.25 MG tablet Take 6.25 mg by mouth 2 (two) times daily. 11/25/22  Yes [provider]  cetirizine (ZYRTEC) 10 MG tablet Take 1 tablet (10 mg total) by mouth daily. 01/04/22  Yes Kerri Perches,  MD  dicyclomine (BENTYL) 20 MG tablet Take 1 tablet (20 mg total) by mouth 2 (two) times daily. 04/08/23  Yes August Longest, Gerrit Friends, MD  fluticasone [facility]) 50 MCG/ACT nasal spray USE 2 SPRAYS IN BOTH NOSTRILS  DAILY 08/08/22  Yes Kerri Perches, MD  glipiZIDE (GLUCOTROL) 10 MG tablet TAKE 1 TABLET BY MOUTH TWICE  DAILY BEFORE MEALS 03/07/23  Yes Kerri Perches, MD  metFORMIN (GLUCOPHAGE) 1000 MG tablet TAKE 1 TABLET BY MOUTH TWICE  DAILY WITH MEALS 08/13/22  Yes Kerri Perches, MD  montelukast (SINGULAIR) 10 MG tablet TAKE 1 TABLET BY MOUTH AT  BEDTIME 08/13/22  Yes Kerri Perches, MD   Multiple Vitamin (MULTIVITAMIN) capsule Take 1 capsule by mouth daily.   Yes [provider]  NIFEdipine (ADALAT CC) 90 MG 24 hr tablet TAKE 1 TABLET BY MOUTH DAILY 08/13/22  Yes Kerri Perches, MD  olmesartan (BENICAR) 40 MG tablet TAKE 1 TABLET BY MOUTH DAILY 08/13/22  Yes Kerri Perches, MD  pantoprazole (PROTONIX) 40 MG tablet TAKE 1 TABLET BY MOUTH DAILY  BEFORE BREAKFAST 04/02/23  Yes Ilka Lovick, Gerrit Friends, MD  rosuvastatin (CRESTOR) 5 MG tablet TAKE ONE TABLET BY MOUTH EVERY MONDAY AND FRIDAY 03/18/23  Yes Kerri Perches, MD  spironolactone (ALDACTONE) 100 MG tablet TAKE 1 TABLET BY MOUTH DAILY 04/01/23  Yes Kerri Perches, MD  cholestyramine Lanetta Inch) 4 g packet Take 4 g by mouth 3 (three) times daily with meals. Patient not taking: Reported on 04/05/2023    [provider]  doxycycline (VIBRA-TABS) 100 MG tablet Take 1 tablet (100 mg total) by mouth 2 (two) times daily. 04/03/23   Kerri Perches, MD  glucose blood [facility] ULTRA) test strip TEST ONCE DAILY 12/14/22   Kerri Perches, MD  Lancets [facility] DELICA PLUS [place]) MISC Test once daily DX e11.9 01/30/21   Kerri Perches, MD  Lancets [facility] ULTRASOFT) lancets Use as instructed 05/02/22   Kerri Perches, MD  RESTASIS 0.05 % ophthalmic emulsion 1 drop 2 (two) times daily. 02/28/22   [provider]    Allergies as of 04/09/2023 - Review Complete 04/05/2023  Allergen Reaction Noted   Gabapentin Shortness Of Breath 02/13/2017   Ace inhibitors Cough 03/26/2012   Aspirin     Oxycodone Hives 04/01/2018   Penicillins Hives    Percocet [oxycodone-acetaminophen] Hives 03/03/2021   Rebif [interferon beta-1a] Other (See Comments) 05/01/2018   Statins Other (See Comments) 10/11/2012    Family History  Problem Relation Age of Onset   Heart failure Mother    Hypertension Mother    Arthritis Mother    Stroke Father    Hypertension Sister    Diabetes Sister    Heart disease  Sister    Kidney failure Sister    Hypertension Sister    Hypertension Brother    Mental illness Brother    Hypertension Son    Colon cancer Maternal Aunt     Social History   Socioeconomic History   Marital status: Married    Spouse name: Simona Huh    Number of children: 4   Years of education: 13   Highest education level: Some college, no degree  Occupational History   Occupation: unemployed     Associate Professor: UNEMPLOYED  Tobacco Use   Smoking status: Never   Smokeless tobacco: Never   Tobacco comments:    patient lives with a smoker   Vaping Use   Vaping status: Never Used  Substance  and Sexual Activity   Alcohol use: No   Drug use: No   Sexual activity: Yes    Birth control/protection: Post-menopausal  Other Topics Concern   Not on file  Social History Narrative   Patient lives at home with husband Simona Huh.    Patient has 4 children.    Patient has a some college.    Patient is right handed.    Patient is currently not working.    Social Drivers of Corporate investment banker Strain: Low Risk  (03/28/2023)   Overall Financial Resource Strain (CARDIA)    Difficulty of Paying Living Expenses: Not hard at all  Food Insecurity: No Food Insecurity (03/28/2023)   Hunger Vital Sign    Worried About Running Out of Food in the Last Year: Never true    Ran Out of Food in the Last Year: Never true  Transportation Needs: No Transportation Needs (03/28/2023)   PRAPARE - Administrator, Civil Service (Medical): No    Lack of Transportation (Non-Medical): No  Physical Activity: Unknown (03/28/2023)   Exercise Vital Sign    Days of Exercise per Week: 0 days    Minutes of Exercise per Session: Patient declined  Stress: No Stress Concern Present (03/28/2023)   [employer] of Occupational Health - Occupational Stress Questionnaire    Feeling of Stress : Not at all  Social Connections: Socially Integrated (03/28/2023)   Social Connection and Isolation Panel [NHANES]     Frequency of Communication with Friends and Family: More than three times a week    Frequency of Social Gatherings with Friends and Family: More than three times a week    Attends Religious Services: More than 4 times per year    Active Member of [employer] or Organizations: Yes    Attends Engineer, structural: More than 4 times per year    Marital Status: Married  Catering manager Violence: Not At Risk (02/06/2023)   Humiliation, Afraid, Rape, and Kick questionnaire    Fear of Current or Ex-Partner: No    Emotionally Abused: No    Physically Abused: No    Sexually Abused: No    Review of Systems: See HPI, otherwise negative ROS  Physical Exam: BP (!) 160/66   Pulse 71   Temp 97.9 F (36.6 C) (Oral)   Resp 18   LMP 08/18/2012   SpO2 100%  General:   Alert,  Well-developed, well-nourished, pleasant and cooperative in NAD Neck:  Supple; no masses or thyromegaly. No significant cervical adenopathy. Lungs:  Clear throughout to auscultation.   No wheezes, crackles, or rhonchi. No acute distress. Heart:  Regular rate and rhythm; no murmurs, clicks, rubs,  or gallops. Abdomen: Non-distended, normal bowel sounds.  Soft and nontender without appreciable mass or hepatosplenomegaly.  Impression/Plan: 68 year old lady with history colonic adenoma here for surveillance colonoscopy.  Also with loose stools.  Consider segmental biopsies during the procedure The risks, benefits, limitations, alternatives and imponderables have been reviewed with the patient. Questions have been answered. All parties are agreeable.       Notice: This dictation was prepared with Dragon dictation along with smaller phrase technology. Any transcriptional errors that result from this process are unintentional and may not be corrected upon review.

## 2023-05-22 NOTE — Anesthesia Preprocedure Evaluation (Signed)
 Anesthesia Evaluation  Patient identified by MRN, date of birth, ID band Patient awake    Reviewed: Allergy & Precautions, H&P , NPO status , Patient's Chart, lab work & pertinent test results, reviewed documented beta blocker date and time   Airway Mallampati: II  TM Distance: >3 FB Neck ROM: full    Dental no notable dental hx.    Pulmonary neg pulmonary ROS, sleep apnea    Pulmonary exam normal breath sounds clear to auscultation       Cardiovascular Exercise Tolerance: Good hypertension, negative cardio ROS  Rhythm:regular Rate:Normal     Neuro/Psych  Headaches negative neurological ROS  negative psych ROS   GI/Hepatic negative GI ROS, Neg liver ROS,GERD  ,,  Endo/Other  negative endocrine ROSdiabetes    Renal/GU Renal diseasenegative Renal ROS  negative genitourinary   Musculoskeletal   Abdominal   Peds  Hematology negative hematology ROS (+) Blood dyscrasia, anemia   Anesthesia Other Findings   Reproductive/Obstetrics negative OB ROS                             Anesthesia Physical Anesthesia Plan  ASA: 3  Anesthesia Plan: General   Post-op Pain Management:    Induction:   PONV Risk Score and Plan: Propofol infusion  Airway Management Planned:   Additional Equipment:   Intra-op Plan:   Post-operative Plan:   Informed Consent: I have reviewed the patients History and Physical, chart, labs and discussed the procedure including the risks, benefits and alternatives for the proposed anesthesia with the patient or authorized representative who has indicated his/her understanding and acceptance.     Dental Advisory Given  Plan Discussed with: CRNA  Anesthesia Plan Comments:        Anesthesia Quick Evaluation

## 2023-05-23 ENCOUNTER — Encounter (HOSPITAL_COMMUNITY): Payer: Self-pay | Admitting: Internal Medicine

## 2023-05-24 LAB — SURGICAL PATHOLOGY

## 2023-05-24 NOTE — Anesthesia Postprocedure Evaluation (Signed)
 Anesthesia Post Note  Patient: Michaela Peterson  Procedure(s) Performed: COLONOSCOPY  Patient location during evaluation: Phase II Anesthesia Type: General Level of consciousness: awake Pain management: pain level controlled Vital Signs Assessment: post-procedure vital signs reviewed and stable Respiratory status: spontaneous breathing and respiratory function stable Cardiovascular status: blood pressure returned to baseline and stable Postop Assessment: no headache and no apparent nausea or vomiting Anesthetic complications: no Comments: Late entry   No notable events documented.   Last Vitals:  Vitals:   05/22/23 0747 05/22/23 0940  BP: (!) 160/66 (!) 112/45  Pulse: 71 78  Resp: 18 (!) 22  Temp: 36.6 C 36.6 C  SpO2: 100% 100%    Last Pain:  Vitals:   05/22/23 0940  TempSrc: Oral  PainSc: 0-No pain                 Windell Norfolk

## 2023-05-26 ENCOUNTER — Encounter: Payer: Self-pay | Admitting: Internal Medicine

## 2023-06-14 DIAGNOSIS — M5416 Radiculopathy, lumbar region: Secondary | ICD-10-CM | POA: Diagnosis not present

## 2023-07-03 ENCOUNTER — Ambulatory Visit: Payer: Medicare Other | Admitting: Family Medicine

## 2023-07-13 ENCOUNTER — Other Ambulatory Visit: Payer: Self-pay | Admitting: Family Medicine

## 2023-07-14 ENCOUNTER — Other Ambulatory Visit: Payer: Self-pay | Admitting: Family Medicine

## 2023-07-15 ENCOUNTER — Encounter: Payer: Self-pay | Admitting: Internal Medicine

## 2023-07-17 DIAGNOSIS — N189 Chronic kidney disease, unspecified: Secondary | ICD-10-CM | POA: Diagnosis not present

## 2023-07-17 DIAGNOSIS — R809 Proteinuria, unspecified: Secondary | ICD-10-CM | POA: Diagnosis not present

## 2023-07-17 DIAGNOSIS — E211 Secondary hyperparathyroidism, not elsewhere classified: Secondary | ICD-10-CM | POA: Diagnosis not present

## 2023-07-17 DIAGNOSIS — D631 Anemia in chronic kidney disease: Secondary | ICD-10-CM | POA: Diagnosis not present

## 2023-07-17 LAB — HEMOGLOBIN A1C
Calcium: 9.8
Creatinine, POC: 167.9 mg/dL
EGFR: 48
Hemoglobin A1C: 7.2
PTH: 36

## 2023-07-24 DIAGNOSIS — E1122 Type 2 diabetes mellitus with diabetic chronic kidney disease: Secondary | ICD-10-CM | POA: Diagnosis not present

## 2023-07-24 DIAGNOSIS — R809 Proteinuria, unspecified: Secondary | ICD-10-CM | POA: Diagnosis not present

## 2023-07-24 DIAGNOSIS — E1129 Type 2 diabetes mellitus with other diabetic kidney complication: Secondary | ICD-10-CM | POA: Diagnosis not present

## 2023-07-24 DIAGNOSIS — I5032 Chronic diastolic (congestive) heart failure: Secondary | ICD-10-CM | POA: Diagnosis not present

## 2023-07-25 DIAGNOSIS — I739 Peripheral vascular disease, unspecified: Secondary | ICD-10-CM | POA: Diagnosis not present

## 2023-07-25 DIAGNOSIS — M79672 Pain in left foot: Secondary | ICD-10-CM | POA: Diagnosis not present

## 2023-07-25 DIAGNOSIS — E1140 Type 2 diabetes mellitus with diabetic neuropathy, unspecified: Secondary | ICD-10-CM | POA: Diagnosis not present

## 2023-07-25 DIAGNOSIS — M79671 Pain in right foot: Secondary | ICD-10-CM | POA: Diagnosis not present

## 2023-07-25 DIAGNOSIS — L110 Acquired keratosis follicularis: Secondary | ICD-10-CM | POA: Diagnosis not present

## 2023-08-01 ENCOUNTER — Encounter: Payer: Self-pay | Admitting: Family Medicine

## 2023-08-01 ENCOUNTER — Ambulatory Visit (INDEPENDENT_AMBULATORY_CARE_PROVIDER_SITE_OTHER): Admitting: Family Medicine

## 2023-08-01 VITALS — BP 138/90 | HR 67 | Resp 16 | Ht 62.0 in | Wt 187.1 lb

## 2023-08-01 DIAGNOSIS — E785 Hyperlipidemia, unspecified: Secondary | ICD-10-CM

## 2023-08-01 DIAGNOSIS — N1831 Chronic kidney disease, stage 3a: Secondary | ICD-10-CM

## 2023-08-01 DIAGNOSIS — E790 Hyperuricemia without signs of inflammatory arthritis and tophaceous disease: Secondary | ICD-10-CM | POA: Diagnosis not present

## 2023-08-01 DIAGNOSIS — E1149 Type 2 diabetes mellitus with other diabetic neurological complication: Secondary | ICD-10-CM

## 2023-08-01 DIAGNOSIS — I10 Essential (primary) hypertension: Secondary | ICD-10-CM

## 2023-08-01 MED ORDER — LANCET DEVICE MISC: 1.0000 | Freq: Three times a day (TID) | 0 refills | Status: DC

## 2023-08-01 MED ORDER — DEXCOM G7 RECEIVER DEVI: 1.0000 | Freq: Every day | 0 refills | Status: DC

## 2023-08-01 MED ORDER — BLOOD PRESSURE KIT: 1.0000 | PACK | Freq: Every day | 0 refills | Status: DC | PRN

## 2023-08-01 MED ORDER — BLOOD GLUCOSE TEST VI STRP: 1.0000 | ORAL_STRIP | Freq: Three times a day (TID) | 0 refills | Status: AC

## 2023-08-01 MED ORDER — DEXCOM G7 SENSOR MISC: 1.0000 | Freq: Three times a day (TID) | 3 refills | Status: DC

## 2023-08-01 MED ORDER — LANCETS MISC. MISC: 1.0000 | Freq: Three times a day (TID) | 0 refills | Status: AC

## 2023-08-01 MED ORDER — BLOOD GLUCOSE MONITORING SUPPL DEVI: 1.0000 | Freq: Three times a day (TID) | 0 refills | Status: DC

## 2023-08-01 NOTE — Assessment & Plan Note (Signed)
 Diabetes associated with hypertension, hyperlipidemia, CKD, arthritis, and neurological disease  Ms. Vannatter is reminded of the importance of commitment to daily physical activity for 30 minutes or more, as able and the need to limit carbohydrate intake to 30 to 60 grams per meal to help with blood sugar control.   The need to take medication as prescribed, test blood sugar as directed, and to call between visits if there is a concern that blood sugar is uncontrolled is also discussed.   Ms. Carbonell is reminded of the importance of daily foot exam, annual eye examination, and good blood sugar, blood pressure and cholesterol control.     Latest Ref Rng & Units 07/17/2023   12:00 AM 05/17/2023    9:37 AM 05/08/2023   11:31 AM 01/02/2023   10:51 AM 09/28/2022   10:53 AM  Diabetic Labs  HbA1c  7.2      6.9  7.9  6.8   Chol 100 - 199 mg/dL    098    HDL >11 mg/dL    58    Calc LDL 0 - 99 mg/dL    57    Triglycerides 0 - 149 mg/dL    73    Creatinine 9.14 - 1.00 mg/dL  7.82  9.56  2.13       This result is from an external source.      08/01/2023   11:16 AM 08/01/2023   10:46 AM 05/22/2023    9:40 AM 05/22/2023    7:47 AM 05/15/2023    2:55 PM 04/05/2023    9:13 AM 04/03/2023   10:45 AM  BP/Weight  Systolic BP 138 167 112 160  106 120  Diastolic BP 90 82 45 66  54 77  Wt. (Lbs)  187.12   187.17 187.2 185.12  BMI  34.22 kg/m2   36.55 kg/m2 36.56 kg/m2 36.15 kg/m2      Latest Ref Rng & Units 07/30/2022   12:00 AM 02/26/2022   12:00 AM  Foot/eye exam completion dates  Eye Exam No Retinopathy No Retinopathy     No Retinopathy         This result is from an external source.

## 2023-08-01 NOTE — Assessment & Plan Note (Signed)
 Hyperlipidemia:Low fat diet discussed and encouraged.   Lipid Panel  Lab Results  Component Value Date   CHOL 130 01/02/2023   HDL 58 01/02/2023   LDLCALC 57 01/02/2023   TRIG 73 01/02/2023   CHOLHDL 2.2 01/02/2023     Updated lab needed at/ before next visit.

## 2023-08-01 NOTE — Progress Notes (Signed)
 Michaela Peterson     MRN: [id number]      DOB: 05/14/1955  Chief Complaint  Patient presents with   Medical Management of Chronic Issues    13 weeks follow up. Ongoing bilateral leg pain and swelling that is not improving     HPI Michaela Peterson is here for follow up and re-evaluation of chronic medical conditions, medication management and review of any available recent lab and radiology data.  Preventive health is updated, specifically  Cancer screening and Immunization.   Questions or concerns regarding consultations or procedures which the PT has had in the interim are  addressed. The PT denies any adverse reactions to current medications since the last visit.  C/o ongoing lower ext pain and discomfort ,  being treated with epidural thru Ortho with epidural which increased her blood sugar ROS Denies recent fever or chills. Denies sinus pressure, nasal congestion, ear pain or sore throat. Denies chest congestion, productive cough or wheezing. Denies chest pains, palpitations and leg swelling Denies abdominal pain, nausea, vomiting,diarrhea or constipation.   Denies dysuria, frequency, hesitancy or incontinence. . Denies headaches, seizures, numbness, or tingling. Denies depression, anxiety or insomnia. Denies skin break down or rash.   PE  BP (!) 138/90   Pulse 67   Resp 16   Ht 5\' 2"  (1.575 m)   Wt 187 lb 1.9 oz (84.9 kg)   LMP 08/18/2012   SpO2 96%   BMI 34.22 kg/m   Patient alert and oriented and in no cardiopulmonary distress.  HEENT: No facial asymmetry, EOMI,     Neck decreased ROM  Chest: Clear to auscultation bilaterally.  CVS: S1, S2 no murmurs, no S3.Regular rate.  ABD: Soft non tender.   Ext: No edema  MS: decreased  ROM spine, shoulders, hips and knees.  Skin: Intact, no ulcerations or rash noted.  Psych: Good eye contact, normal affect. Memory intact not anxious or depressed appearing.  CNS: CN 2-12 intact, power,  normal throughout.no focal deficits  noted.   Assessment & Plan  Primary hypertension Elevated diastolic, not at goal DASH diet and commitment to daily physical activity for a minimum of 30 minutes discussed and encouraged, as a part of hypertension management. The importance of attaining a healthy weight is also discussed.     08/01/2023   11:16 AM 08/01/2023   10:46 AM 05/22/2023    9:40 AM 05/22/2023    7:47 AM 05/15/2023    2:55 PM 04/05/2023    9:13 AM 04/03/2023   10:45 AM  BP/Weight  Systolic BP 138 167 112 160  106 120  Diastolic BP 90 82 45 66  54 77  Wt. (Lbs)  187.12   187.17 187.2 185.12  BMI  34.22 kg/m2   36.55 kg/m2 36.56 kg/m2 36.15 kg/m2     Will re address next visit, no med change  Type 2 diabetes mellitus with neurological complications (HCC) Diabetes associated with hypertension, hyperlipidemia, CKD, arthritis, and neurological disease  Michaela Peterson is reminded of the importance of commitment to daily physical activity for 30 minutes or more, as able and the need to limit carbohydrate intake to 30 to 60 grams per meal to help with blood sugar control.   The need to take medication as prescribed, test blood sugar as directed, and to call between visits if there is a concern that blood sugar is uncontrolled is also discussed.   Michaela Peterson is reminded of the importance of daily foot exam, annual eye examination,  and good blood sugar, blood pressure and cholesterol control.     Latest Ref Rng & Units 07/17/2023   12:00 AM 05/17/2023    9:37 AM 05/08/2023   11:31 AM 01/02/2023   10:51 AM 09/28/2022   10:53 AM  Diabetic Labs  HbA1c  7.2      6.9  7.9  6.8   Chol 100 - 199 mg/dL    161    HDL >09 mg/dL    58    Calc LDL 0 - 99 mg/dL    57    Triglycerides 0 - 149 mg/dL    73    Creatinine 6.04 - 1.00 mg/dL  5.40  9.81  1.91       This result is from an external source.      08/01/2023   11:16 AM 08/01/2023   10:46 AM 05/22/2023    9:40 AM 05/22/2023    7:47 AM 05/15/2023    2:55 PM 04/05/2023    9:13  AM 04/03/2023   10:45 AM  BP/Weight  Systolic BP 138 167 112 160  106 120  Diastolic BP 90 82 45 66  54 77  Wt. (Lbs)  187.12   187.17 187.2 185.12  BMI  34.22 kg/m2   36.55 kg/m2 36.56 kg/m2 36.15 kg/m2      Latest Ref Rng & Units 07/30/2022   12:00 AM 02/26/2022   12:00 AM  Foot/eye exam completion dates  Eye Exam No Retinopathy No Retinopathy     No Retinopathy         This result is from an external source.        Morbid obesity (HCC)  Patient re-educated about  the importance of commitment to a  minimum of 150 minutes of exercise per week as able.  The importance of healthy food choices with portion control discussed, as well as eating regularly and within a 12 hour window most days. The need to choose "clean , green" food 50 to 75% of the time is discussed, as well as to make water  the primary drink and set a goal of 64 ounces water  daily.       08/01/2023   10:46 AM 05/15/2023    2:55 PM 04/05/2023    9:13 AM  Weight /BMI  Weight 187 lb 1.9 oz 187 lb 2.7 oz 187 lb 3.2 oz  Height 5\' 2"  (1.575 m) 5' (1.524 m) 5' (1.524 m)  BMI 34.22 kg/m2 36.55 kg/m2 36.56 kg/m2    Unchanged  Hyperlipidemia LDL goal <100 Hyperlipidemia:Low fat diet discussed and encouraged.   Lipid Panel  Lab Results  Component Value Date   CHOL 130 01/02/2023   HDL 58 01/02/2023   LDLCALC 57 01/02/2023   TRIG 73 01/02/2023   CHOLHDL 2.2 01/02/2023     Updated lab needed at/ before next visit.   Gout Updated uric acid level needed  Hyperuricemia Updated lab needed at/ before next visit.

## 2023-08-01 NOTE — Patient Instructions (Addendum)
 F/u 10/25/2023 or after   Fasting lipid, hepatic, HBa1C  8/8 or after  Work on less sweets and sugar , then less nausea and loose stool  Work through leg pain from the spine with  Ortho  Please order BP  cuff   Pls order diabetic testing supply needed asking for dexcomn unsure if will qualify , let  her know  Abstract hba1c in may 2025 pls  It is important that you exercise regularly at least 30 minutes 5 times a week. If you develop chest pain, have severe difficulty breathing, or feel very tired, stop exercising immediately and seek medical attention    Thanks for choosing [facility], we consider it a privelige to serve you.

## 2023-08-01 NOTE — Assessment & Plan Note (Addendum)
 Elevated diastolic, not at goal DASH diet and commitment to daily physical activity for a minimum of 30 minutes discussed and encouraged, as a part of hypertension management. The importance of attaining a healthy weight is also discussed.     08/01/2023   11:16 AM 08/01/2023   10:46 AM 05/22/2023    9:40 AM 05/22/2023    7:47 AM 05/15/2023    2:55 PM 04/05/2023    9:13 AM 04/03/2023   10:45 AM  BP/Weight  Systolic BP 138 167 112 160  106 120  Diastolic BP 90 82 45 66  54 77  Wt. (Lbs)  187.12   187.17 187.2 185.12  BMI  34.22 kg/m2   36.55 kg/m2 36.56 kg/m2 36.15 kg/m2     Will re address next visit, no med change

## 2023-08-01 NOTE — Assessment & Plan Note (Signed)
 Updated lab needed at/ before next visit.

## 2023-08-01 NOTE — Assessment & Plan Note (Signed)
Updated uric acid level needed

## 2023-08-01 NOTE — Assessment & Plan Note (Signed)
  Patient re-educated about  the importance of commitment to a  minimum of 150 minutes of exercise per week as able.  The importance of healthy food choices with portion control discussed, as well as eating regularly and within a 12 hour window most days. The need to choose "clean , green" food 50 to 75% of the time is discussed, as well as to make water  the primary drink and set a goal of 64 ounces water  daily.       08/01/2023   10:46 AM 05/15/2023    2:55 PM 04/05/2023    9:13 AM  Weight /BMI  Weight 187 lb 1.9 oz 187 lb 2.7 oz 187 lb 3.2 oz  Height 5\' 2"  (1.575 m) 5' (1.524 m) 5' (1.524 m)  BMI 34.22 kg/m2 36.55 kg/m2 36.56 kg/m2    Unchanged

## 2023-08-02 ENCOUNTER — Other Ambulatory Visit (HOSPITAL_COMMUNITY): Payer: Self-pay

## 2023-08-02 ENCOUNTER — Telehealth: Payer: Self-pay | Admitting: Pharmacy Technician

## 2023-08-02 LAB — MICROALBUMIN / CREATININE URINE RATIO
Creatinine, Urine: 165.8 mg/dL
Microalb/Creat Ratio: 23 mg/g{creat} (ref 0–29)
Microalbumin, Urine: 37.5 ug/mL

## 2023-08-02 NOTE — Telephone Encounter (Signed)
 Pharmacy Patient Advocate Encounter   Received notification from Onbase that prior authorization for Dexcom G7 Receiver device is required/requested.   Insurance verification completed.   The patient is insured through [place] .   Per test claim: PA required; PA submitted to above mentioned insurance via CoverMyMeds Key/confirmation #/EOC [facility] Status is pending

## 2023-08-02 NOTE — Telephone Encounter (Signed)
 Pharmacy Patient Advocate Encounter   Received notification from Onbase that prior authorization for Dexcom G7 Sensor is required/requested.   Insurance verification completed.   The patient is insured through [place] .   Per test claim: PA required; PA submitted to above mentioned insurance via CoverMyMeds Key/confirmation #/EOC [id number] Status is pending

## 2023-08-06 ENCOUNTER — Other Ambulatory Visit: Payer: Self-pay

## 2023-08-06 DIAGNOSIS — E119 Type 2 diabetes mellitus without complications: Secondary | ICD-10-CM | POA: Diagnosis not present

## 2023-08-06 DIAGNOSIS — E1149 Type 2 diabetes mellitus with other diabetic neurological complication: Secondary | ICD-10-CM

## 2023-08-06 MED ORDER — LANCET DEVICE MISC: 1.0000 | Freq: Three times a day (TID) | 0 refills | Status: DC

## 2023-08-06 NOTE — Telephone Encounter (Signed)
Pt informed

## 2023-08-06 NOTE — Telephone Encounter (Signed)
 Pharmacy Patient Advocate Encounter  Received notification from [facility] that Prior Authorization for Dexcom G7 Receiver device has been DENIED.  Full denial letter will be uploaded to the media tab. See denial reason below.

## 2023-08-06 NOTE — Telephone Encounter (Signed)
 Pharmacy Patient Advocate Encounter  Received notification from [facility] that Prior Authorization for Dexcom G7 Sensor has been DENIED.  Full denial letter will be uploaded to the media tab. See denial reason below.   PA #/Case ID/Reference #: [id number]

## 2023-08-07 DIAGNOSIS — M5416 Radiculopathy, lumbar region: Secondary | ICD-10-CM | POA: Diagnosis not present

## 2023-08-10 ENCOUNTER — Other Ambulatory Visit: Payer: Self-pay | Admitting: Family Medicine

## 2023-08-13 ENCOUNTER — Other Ambulatory Visit: Payer: Self-pay

## 2023-08-13 ENCOUNTER — Telehealth: Payer: Self-pay

## 2023-08-13 DIAGNOSIS — E1149 Type 2 diabetes mellitus with other diabetic neurological complication: Secondary | ICD-10-CM

## 2023-08-13 MED ORDER — LANCET DEVICE MISC: 1.0000 | Freq: Three times a day (TID) | 0 refills | Status: AC

## 2023-08-13 NOTE — Telephone Encounter (Signed)
 Copied from CRM [id number]. Topic: Clinical - Prescription Issue >> Aug 12, 2023  4:24 PM Sasha H wrote: Reason for CRM: Pt is still waiting to hear back about OneTouch device, states she was able to get the lantus but doesn't have the device

## 2023-08-13 NOTE — Telephone Encounter (Signed)
 Rx recent for lancing device to [facility]. Will call pharmacy to confirm

## 2023-09-03 ENCOUNTER — Ambulatory Visit: Admitting: Internal Medicine

## 2023-09-03 ENCOUNTER — Encounter: Payer: Self-pay | Admitting: Internal Medicine

## 2023-09-03 VITALS — BP 125/70 | HR 54 | Temp 98.4°F | Ht 60.0 in | Wt 189.2 lb

## 2023-09-03 DIAGNOSIS — R194 Change in bowel habit: Secondary | ICD-10-CM | POA: Diagnosis not present

## 2023-09-03 DIAGNOSIS — K760 Fatty (change of) liver, not elsewhere classified: Secondary | ICD-10-CM

## 2023-09-03 DIAGNOSIS — K219 Gastro-esophageal reflux disease without esophagitis: Secondary | ICD-10-CM

## 2023-09-03 DIAGNOSIS — Z860100 Personal history of colon polyps, unspecified: Secondary | ICD-10-CM

## 2023-09-03 DIAGNOSIS — Z860101 Personal history of adenomatous and serrated colon polyps: Secondary | ICD-10-CM | POA: Diagnosis not present

## 2023-09-03 DIAGNOSIS — R197 Diarrhea, unspecified: Secondary | ICD-10-CM

## 2023-09-03 NOTE — Patient Instructions (Signed)
 It was good to see you again today!  As discussed, good control of diabetes, eating a balanced diet will serve you well and also help with your digestive system.  Continue pantoprazole  Protonix  40 mg daily for acid reflux.  You will be due for a repeat colonoscopy in 2032  Plan to see you back in the office in 1 year and as needed

## 2023-09-03 NOTE — Progress Notes (Unsigned)
 Primary Care Physician:  Antonetta Rollene BRAVO, MD Primary Gastroenterologist:  Dr. Shaaron  Pre-Procedure History & Physical: HPI:  Michaela Peterson is a 68 y.o. female here for follow-up.  Bowel function is improved on her own with watching her diet.  Specifically if she eats things like lemon pound cake and other carbohydrate fortified food she tends to have  loose bowels.  When she does better with a balanced diet bowel function is good 1 bowel movement daily to every other day.  Recent colonoscopy demonstrated no polyps.  Biopsies negative for microscopic colitis; slated for repeat colonoscopy for surveillance purposes 2032.  She has gained 9 pounds since her last office visit here.  She attributes weight gain to steroid injections.  Reflux symptoms fairly controlled on pantoprazole .  No dysphagia.  History of fatty liver on imaging.  Normal LFTs.  Past Medical History:  Diagnosis Date   Adnexal mass    Left; followed by Dr. Edsel   Anemia, iron  deficiency    Arthritis    Chronic low back pain    Diabetes mellitus, type 2 (HCC)    Essential hypertension    Gastroparesis    GERD (gastroesophageal reflux disease)    MS (multiple sclerosis) (HCC)    Nonalcoholic fatty liver disease    Obesity    Sleep apnea     Past Surgical History:  Procedure Laterality Date   AGILE CAPSULE N/A 08/11/2014   Procedure: AGILE CAPSULE;  Surgeon: Lamar CHRISTELLA Shaaron, MD;  [facility];  Service: Endoscopy;  Laterality: N/A;  0700   BALLOON DILATION N/A 03/07/2021   Procedure: BALLOON DILATION;  Surgeon: Cindie Carlin POUR, DO;  Location: [facility];  Service: Endoscopy;  Laterality: N/A;   BIOPSY  03/07/2021   Procedure: BIOPSY;  Surgeon: Cindie Carlin POUR, DO;  [facility]: [facility];  Service: Endoscopy;;   BLADDER SUSPENSION     CESAREAN SECTION     x2    CHOLECYSTECTOMY     COLONOSCOPY  2010   Dr. Shaaron: tubular adenoma, few scattered diverticula   COLONOSCOPY N/A 10/01/2013    Dr. Carlo preparation. Normal rectum. Normal colonic mucosa   COLONOSCOPY N/A 11/12/2018   sigmoid and descending colon diverticulosis. Two 4-5 mm polyps in ascending colon. (tubular adenomas). Surveillance in 5 years   COLONOSCOPY N/A 05/22/2023   Procedure: COLONOSCOPY;  Surgeon: Shaaron Lamar CHRISTELLA, MD;  [facility];  Service: Endoscopy;  Laterality: N/A;   DILATION AND CURETTAGE OF UTERUS     4   ESOPHAGOGASTRODUODENOSCOPY  2010   Dr. Shaaron: normal esophagus, small hiatal hernia, questionable pale duodenal mucosa but negative for celiac sprue.    ESOPHAGOGASTRODUODENOSCOPY N/A 10/01/2013   Dr. Eldana Isip:gastric polyps-status post biopsy. Otherwise, normal EGD. fundic gland polyp, negative H.pylori   ESOPHAGOGASTRODUODENOSCOPY (EGD) WITH PROPOFOL  N/A 03/07/2021   Procedure: ESOPHAGOGASTRODUODENOSCOPY (EGD) WITH PROPOFOL ;  Surgeon: Cindie Carlin POUR, DO;  [facility];  Service: Endoscopy;  Laterality: N/A;  12:00pm   GIVENS CAPSULE STUDY N/A 09/08/2014   Poor prep but overall unremarkable    KNEE ARTHROSCOPY W/ DEBRIDEMENT     LAPAROSCOPY ABDOMEN DIAGNOSTIC     lysis of adhesions   POLYPECTOMY  11/12/2018   Procedure: POLYPECTOMY;  Surgeon: Shaaron Lamar CHRISTELLA, MD;  [facility]: [facility];  Service: Endoscopy;;   ROTATOR CUFF REPAIR     TENDON LENGTHENING     Left wrist   UMBILICAL HERNIA REPAIR      Prior  to Admission medications   Medication Sig Start Date End Date Taking? Authorizing Provider  allopurinol  (ZYLOPRIM ) 100 MG tablet TAKE 1 TABLET BY MOUTH DAILY 07/15/23  Yes Antonetta Rollene BRAVO, MD  aspirin EC 81 MG tablet Take 81 mg by mouth daily.   Yes [provider]  azelastine  (ASTELIN ) 0.1 % nasal spray Place into both nostrils 2 (two) times daily. Use in each nostril as directed   Yes [provider]  carvedilol  (COREG ) 6.25 MG tablet Take 6.25 mg by mouth 2 (two) times daily. 11/25/22  Yes [provider]  cetirizine  (ZYRTEC ) 10 MG  tablet Take 1 tablet (10 mg total) by mouth daily. 01/04/22  Yes Antonetta Rollene BRAVO, MD  fluticasone  (FLONASE ) 50 MCG/ACT nasal spray USE 2 SPRAYS IN BOTH NOSTRILS  DAILY 08/08/22  Yes Antonetta Rollene BRAVO, MD  glipiZIDE  (GLUCOTROL ) 10 MG tablet TAKE 1 TABLET BY MOUTH TWICE  DAILY BEFORE MEALS 03/07/23  Yes Antonetta Rollene BRAVO, MD  glucose blood [facility] ULTRA) test strip TEST ONCE DAILY 12/14/22  Yes Antonetta Rollene BRAVO, MD  Lancet Device MISC 1 each by Does not apply route in the morning, at noon, and at bedtime. May substitute to any manufacturer covered by patient's insurance. DX: E11.65 08/13/23 09/12/23 Yes Antonetta Rollene BRAVO, MD  Lancets [facility] DELICA PLUS [place]) MISC Test once daily DX e11.9 01/30/21  Yes Antonetta Rollene BRAVO, MD  metFORMIN  (GLUCOPHAGE ) 1000 MG tablet TAKE 1 TABLET BY MOUTH TWICE  DAILY WITH MEALS 07/15/23  Yes Antonetta Rollene BRAVO, MD  montelukast  (SINGULAIR ) 10 MG tablet TAKE 1 TABLET BY MOUTH AT  BEDTIME 07/15/23  Yes Antonetta Rollene BRAVO, MD  NIFEdipine  (ADALAT  CC) 90 MG 24 hr tablet TAKE 1 TABLET BY MOUTH DAILY 07/15/23  Yes Antonetta Rollene BRAVO, MD  olmesartan  (BENICAR ) 40 MG tablet TAKE 1 TABLET BY MOUTH DAILY 07/15/23  Yes Antonetta Rollene BRAVO, MD  pantoprazole  (PROTONIX ) 40 MG tablet TAKE 1 TABLET BY MOUTH DAILY  BEFORE BREAKFAST 04/02/23  Yes Bode Pieper, Lamar HERO, MD  RESTASIS 0.05 % ophthalmic emulsion 1 drop 2 (two) times daily. 02/28/22  Yes [provider]  rosuvastatin  (CRESTOR ) 5 MG tablet TAKE ONE TABLET BY MOUTH EVERY MONDAY AND FRIDAY 03/18/23  Yes Antonetta Rollene BRAVO, MD  spironolactone  (ALDACTONE ) 100 MG tablet TAKE 1 TABLET BY MOUTH DAILY 04/01/23  Yes Antonetta Rollene BRAVO, MD  Blood Glucose Monitoring Suppl DEVI 1 each by Does not apply route in the morning, at noon, and at bedtime. May substitute to any manufacturer covered by patient's insurance. DX e11.65 08/01/23   Antonetta Rollene BRAVO, MD  Blood Pressure KIT 1 each by Does not apply route daily as needed. BP  monitor machine and bp cuff. May substitute for any brand covered by insurance if any. DX: I10 08/01/23   Antonetta Rollene BRAVO, MD    Allergies as of 09/03/2023 - Review Complete 09/03/2023  Allergen Reaction Noted   Gabapentin Shortness Of Breath 02/13/2017   Ace inhibitors Cough 03/26/2012   Acetaminophen   04/24/2023   Albumin human  04/24/2023   Aspirin     Oxycodone Hives 04/01/2018   Penicillins Hives    Percocet [oxycodone-acetaminophen ] Hives 03/03/2021   Rebif [interferon beta-1a] Other (See Comments) 05/01/2018   Statins Other (See Comments) 10/11/2012    Family History  Problem Relation Age of Onset   Heart failure Mother    Hypertension Mother    Arthritis Mother    Stroke Father    Hypertension Sister  Diabetes Sister    Heart disease Sister    Kidney failure Sister    Hypertension Sister    Hypertension Brother    Mental illness Brother    Hypertension Son    Colon cancer Maternal Aunt     Social History   Socioeconomic History   Marital status: Married    Spouse name: Nadara    Number of children: 4   Years of education: 13   Highest education level: Some college, no degree  Occupational History   Occupation: unemployed     Associate Professor: UNEMPLOYED  Tobacco Use   Smoking status: Never   Smokeless tobacco: Never   Tobacco comments:    patient lives with a smoker   Vaping Use   Vaping status: Never Used  Substance and Sexual Activity   Alcohol use: No   Drug use: No   Sexual activity: Yes    Birth control/protection: Post-menopausal  Other Topics Concern   Not on file  Social History Narrative   Patient lives at home with husband Nadara.    Patient has 4 children.    Patient has a some college.    Patient is right handed.    Patient is currently not working.    Social Drivers of Corporate investment banker Strain: Low Risk  (03/28/2023)   Overall Financial Resource Strain (CARDIA)    Difficulty of Paying Living Expenses: Not hard at all  Food  Insecurity: No Food Insecurity (03/28/2023)   Hunger Vital Sign    Worried About Running Out of Food in the Last Year: Never true    Ran Out of Food in the Last Year: Never true  Transportation Needs: No Transportation Needs (03/28/2023)   PRAPARE - Administrator, Civil Service (Medical): No    Lack of Transportation (Non-Medical): No  Physical Activity: Unknown (03/28/2023)   Exercise Vital Sign    Days of Exercise per Week: 0 days    Minutes of Exercise per Session: Patient declined  Stress: No Stress Concern Present (03/28/2023)   [employer] of Occupational Health - Occupational Stress Questionnaire    Feeling of Stress : Not at all  Social Connections: Socially Integrated (03/28/2023)   Social Connection and Isolation Panel    Frequency of Communication with Friends and Family: More than three times a week    Frequency of Social Gatherings with Friends and Family: More than three times a week    Attends Religious Services: More than 4 times per year    Active Member of [employer] or Organizations: Yes    Attends Engineer, structural: More than 4 times per year    Marital Status: Married  Catering manager Violence: Not At Risk (02/06/2023)   Humiliation, Afraid, Rape, and Kick questionnaire    Fear of Current or Ex-Partner: No    Emotionally Abused: No    Physically Abused: No    Sexually Abused: No    Review of Systems: See HPI, otherwise negative ROS  Physical Exam: BP 125/70 (BP Location: Right Arm, Patient Position: Sitting, Cuff Size: Normal)   Pulse (!) 54   Temp 98.4 F (36.9 C) (Oral)   Ht 5' (1.524 m)   Wt 189 lb 3.2 oz (85.8 kg)   LMP 08/18/2012   SpO2 95%   BMI 36.95 kg/m  General:   Alert,  Well-developed, well-nourished, pleasant and cooperative in NAD Heart:  Regular rate and rhythm; no murmurs, clicks, rubs,  or gallops. Abdomen: Obese.  Non-distended, normal bowel sounds.  Soft and nontender without appreciable mass or  hepatosplenomegaly.   Impression/Plan: 68 year old lady with GERD well-controlled on pantoprazole .  Alteration in bowel habits intermittently loose largely dietary related.  Eating a more balanced diet is associated with normalization of bowel function.  No incontinence these days.  Fatty liver with normal LFTs.  Again, gets back to obesity and diabetes.  Had a lengthy discussion regarding good glycemic control and weight loss.  Her ability to exercise is limited.  History of colonic adenoma; due for surveillance 2032  Recommendations:  As discussed, good control of diabetes, eating a balanced diet  Continue pantoprazole  Protonix  40 mg daily for acid reflux.  You will be due for a repeat colonoscopy in 2032  Plan to see you back in the office in 1 year and as needed    Notice: This dictation was prepared with Dragon dictation along with smaller phrase technology. Any transcriptional errors that result from this process are unintentional and may not be corrected upon review.

## 2023-09-08 ENCOUNTER — Other Ambulatory Visit: Payer: Self-pay | Admitting: Family Medicine

## 2023-09-08 DIAGNOSIS — E1149 Type 2 diabetes mellitus with other diabetic neurological complication: Secondary | ICD-10-CM

## 2023-10-03 NOTE — Procedures (Signed)
 The patient arrived to the [facility] with c/o overall mask discomfort. The patient stated that she has not used PAP in over a year. The patient was re-fitted with the [employer] Evora FFM (S/M) at a pressure setting of 12cm H20. The patient tolerated the trial fairly well. The patient does not want to use PAP due to overall set-up discomfort. The patient was encouraged to be consistant with therapy. The trial mask was given to the patient for home use.  -VB

## 2023-10-16 ENCOUNTER — Other Ambulatory Visit: Payer: Self-pay | Admitting: Family Medicine

## 2023-10-17 ENCOUNTER — Other Ambulatory Visit: Payer: Self-pay | Admitting: Family Medicine

## 2023-10-17 ENCOUNTER — Other Ambulatory Visit: Payer: Self-pay

## 2023-10-17 MED ORDER — ROSUVASTATIN CALCIUM 5 MG PO TABS: ORAL_TABLET | ORAL | 4 refills | Status: AC

## 2023-10-18 ENCOUNTER — Encounter (HOSPITAL_COMMUNITY): Payer: Self-pay

## 2023-10-18 ENCOUNTER — Ambulatory Visit (HOSPITAL_COMMUNITY)
Admission: RE | Admit: 2023-10-18 | Discharge: 2023-10-18 | Disposition: A | Discharge status: Home / Self Care | Payer: Medicare Other | Source: Ambulatory Visit | Attending: Family Medicine | Admitting: Family Medicine

## 2023-10-18 DIAGNOSIS — Z1231 Encounter for screening mammogram for malignant neoplasm of breast: Secondary | ICD-10-CM | POA: Insufficient documentation

## 2023-10-25 DIAGNOSIS — E1149 Type 2 diabetes mellitus with other diabetic neurological complication: Secondary | ICD-10-CM | POA: Diagnosis not present

## 2023-10-25 DIAGNOSIS — E785 Hyperlipidemia, unspecified: Secondary | ICD-10-CM | POA: Diagnosis not present

## 2023-10-25 DIAGNOSIS — N1831 Chronic kidney disease, stage 3a: Secondary | ICD-10-CM | POA: Diagnosis not present

## 2023-10-26 LAB — HEMOGLOBIN A1C
Est. average glucose Bld gHb Est-mCnc: 194 mg/dL
Hgb A1c MFr Bld: 8.4 % — ABNORMAL HIGH (ref 4.8–5.6)

## 2023-10-26 LAB — HEPATIC FUNCTION PANEL
ALT: 18 IU/L (ref 0–32)
AST: 18 IU/L (ref 0–40)
Albumin: 4.4 g/dL (ref 3.9–4.9)
Alkaline Phosphatase: 115 IU/L (ref 44–121)
Bilirubin Total: 0.3 mg/dL (ref 0.0–1.2)
Bilirubin, Direct: 0.12 mg/dL (ref 0.00–0.40)
Total Protein: 7.1 g/dL (ref 6.0–8.5)

## 2023-10-26 LAB — LIPID PANEL
Chol/HDL Ratio: 3 ratio (ref 0.0–4.4)
Cholesterol, Total: 167 mg/dL (ref 100–199)
HDL: 55 mg/dL
LDL Chol Calc (NIH): 93 mg/dL (ref 0–99)
Triglycerides: 102 mg/dL (ref 0–149)
VLDL Cholesterol Cal: 19 mg/dL (ref 5–40)

## 2023-10-30 DIAGNOSIS — I739 Peripheral vascular disease, unspecified: Secondary | ICD-10-CM | POA: Diagnosis not present

## 2023-10-30 DIAGNOSIS — M79675 Pain in left toe(s): Secondary | ICD-10-CM | POA: Diagnosis not present

## 2023-10-30 DIAGNOSIS — M79674 Pain in right toe(s): Secondary | ICD-10-CM | POA: Diagnosis not present

## 2023-10-30 DIAGNOSIS — M79671 Pain in right foot: Secondary | ICD-10-CM | POA: Diagnosis not present

## 2023-10-30 DIAGNOSIS — M79672 Pain in left foot: Secondary | ICD-10-CM | POA: Diagnosis not present

## 2023-10-30 DIAGNOSIS — E1140 Type 2 diabetes mellitus with diabetic neuropathy, unspecified: Secondary | ICD-10-CM | POA: Diagnosis not present

## 2023-10-30 DIAGNOSIS — L110 Acquired keratosis follicularis: Secondary | ICD-10-CM | POA: Diagnosis not present

## 2023-11-01 ENCOUNTER — Encounter: Payer: Self-pay | Admitting: Family Medicine

## 2023-11-01 ENCOUNTER — Ambulatory Visit (INDEPENDENT_AMBULATORY_CARE_PROVIDER_SITE_OTHER): Admitting: Family Medicine

## 2023-11-01 VITALS — BP 136/74 | HR 65 | Resp 16 | Ht 60.0 in | Wt 190.0 lb

## 2023-11-01 DIAGNOSIS — E790 Hyperuricemia without signs of inflammatory arthritis and tophaceous disease: Secondary | ICD-10-CM | POA: Diagnosis not present

## 2023-11-01 DIAGNOSIS — J0110 Acute frontal sinusitis, unspecified: Secondary | ICD-10-CM

## 2023-11-01 DIAGNOSIS — E559 Vitamin D deficiency, unspecified: Secondary | ICD-10-CM | POA: Diagnosis not present

## 2023-11-01 DIAGNOSIS — E1149 Type 2 diabetes mellitus with other diabetic neurological complication: Secondary | ICD-10-CM

## 2023-11-01 DIAGNOSIS — I10 Essential (primary) hypertension: Secondary | ICD-10-CM | POA: Diagnosis not present

## 2023-11-01 DIAGNOSIS — E785 Hyperlipidemia, unspecified: Secondary | ICD-10-CM

## 2023-11-01 MED ORDER — AZITHROMYCIN 250 MG PO TABS: ORAL_TABLET | ORAL | 0 refills | Status: AC

## 2023-11-01 NOTE — Patient Instructions (Addendum)
 F/U early January  Please schedule flu vaccine in office at checkout  Nurse please obtain recent eye exam from Dr Darroll  Pls get covid vaccine at your pharmacy  HBA1C, uric acid level, cmp and EGFR, CBC, TSH and vit D 3 to 5 days before next appointment  Foot exam today is good    Z pack prescribed for sinusitis ( frontal)  Thanks for choosing [facility], we consider it a privelige to serve you.  ,

## 2023-11-03 DIAGNOSIS — J0110 Acute frontal sinusitis, unspecified: Secondary | ICD-10-CM | POA: Insufficient documentation

## 2023-11-03 NOTE — Assessment & Plan Note (Signed)
 Z pack prescribed

## 2023-11-03 NOTE — Assessment & Plan Note (Signed)
 Deteriorated Diabetes associated with hypertension and hyperlipidemia  Michaela Peterson is reminded of the importance of commitment to daily physical activity for 30 minutes or more, as able and the need to limit carbohydrate intake to 30 to 60 grams per meal to help with blood sugar control.   The need to take medication as prescribed, test blood sugar as directed, and to call between visits if there is a concern that blood sugar is uncontrolled is also discussed.   Michaela Peterson is reminded of the importance of daily foot exam, annual eye examination, and good blood sugar, blood pressure and cholesterol control.     Latest Ref Rng & Units 10/25/2023   10:42 AM 08/01/2023   11:30 AM 07/17/2023   12:00 AM 05/17/2023    9:37 AM 05/08/2023   11:31 AM  Diabetic Labs  HbA1c 4.8 - 5.6 % 8.4   7.2      6.9   Micro/Creat Ratio 0 - 29 mg/g creat  23      Chol 100 - 199 mg/dL 832       HDL >60 mg/dL 55       Calc LDL 0 - 99 mg/dL 93       Triglycerides 0 - 149 mg/dL 897       Creatinine 9.55 - 1.00 mg/dL    8.65  8.59      This result is from an external source.      11/01/2023   10:06 AM 09/03/2023    9:15 AM 09/03/2023    9:11 AM 08/01/2023   11:16 AM 08/01/2023   10:46 AM 05/22/2023    9:40 AM 05/22/2023    7:47 AM  BP/Weight  Systolic BP 136 125 160 138 167 112 160  Diastolic BP 74 70 71 90 82 45 66  Wt. (Lbs) 190  189.2  187.12    BMI 37.11 kg/m2  36.95 kg/m2  34.22 kg/m2        Latest Ref Rng & Units 11/01/2023   10:00 AM 07/30/2022   12:00 AM  Foot/eye exam completion dates  Eye Exam No Retinopathy  No Retinopathy      Foot Form Completion  Done      This result is from an external source.      Updated lab needed at/ before next visit.

## 2023-11-03 NOTE — Progress Notes (Signed)
 Michaela Peterson     MRN: [id number]      DOB: 09-01-1955  Chief Complaint  Patient presents with   Diabetes    3 month follow up     HPI Michaela Peterson is here for follow up and re-evaluation of chronic medical conditions, medication management and review of any available recent lab and radiology data.  Preventive health is updated, specifically  Cancer screening and Immunization.   Questions or concerns regarding consultations or procedures which the PT has had in the interim are  addressed. The PT denies any adverse reactions to current medications since the last visit.  1 week h/o frontal pressure with cloudy drainage and scant cough, chills intermittently , no fever ROS Denies recent fever or chills. Denies nasal congestion, ear pain or sore throat. Denies chest congestion, productive cough or wheezing. Denies chest pains, palpitations and leg swelling Denies abdominal pain, nausea, vomiting,diarrhea or constipation.   Denies dysuria, frequency, hesitancy or incontinence. Denies uncontrolled  joint pain, swelling and limitation in mobility. Denies headaches, seizures, numbness, or tingling. Denies depression, anxiety or insomnia. Denies skin break down or rash.   PE  BP 136/74   Pulse 65   Resp 16   Ht 5' (1.524 m)   Wt 190 lb (86.2 kg)   LMP 08/18/2012   SpO2 97%   BMI 37.11 kg/m   Patient alert and oriented and in no cardiopulmonary distress.  HEENT: No facial asymmetry, EOMI,     Neck supple .Frontal sinus tenderness, TM clear  Chest: Clear to auscultation bilaterally.  CVS: S1, S2 no murmurs, no S3.Regular rate.  ABD: Soft non tender.   Ext: No edema  MS: Adequate ROM spine, shoulders, hips and knees.  Skin: Intact, no ulcerations or rash noted.  Psych: Good eye contact, normal affect. Memory intact not anxious or depressed appearing.  CNS: CN 2-12 intact, power,  normal throughout.no focal deficits noted.   Assessment & Plan  Acute frontal  sinusitis Z pack prescribed  Type 2 diabetes mellitus with neurological complications (HCC) Deteriorated Diabetes associated with hypertension and hyperlipidemia  Michaela Peterson is reminded of the importance of commitment to daily physical activity for 30 minutes or more, as able and the need to limit carbohydrate intake to 30 to 60 grams per meal to help with blood sugar control.   The need to take medication as prescribed, test blood sugar as directed, and to call between visits if there is a concern that blood sugar is uncontrolled is also discussed.   Michaela Peterson is reminded of the importance of daily foot exam, annual eye examination, and good blood sugar, blood pressure and cholesterol control.     Latest Ref Rng & Units 10/25/2023   10:42 AM 08/01/2023   11:30 AM 07/17/2023   12:00 AM 05/17/2023    9:37 AM 05/08/2023   11:31 AM  Diabetic Labs  HbA1c 4.8 - 5.6 % 8.4   7.2      6.9   Micro/Creat Ratio 0 - 29 mg/g creat  23      Chol 100 - 199 mg/dL 832       HDL >60 mg/dL 55       Calc LDL 0 - 99 mg/dL 93       Triglycerides 0 - 149 mg/dL 897       Creatinine 9.55 - 1.00 mg/dL    8.65  8.59      This result is from an external source.  11/01/2023   10:06 AM 09/03/2023    9:15 AM 09/03/2023    9:11 AM 08/01/2023   11:16 AM 08/01/2023   10:46 AM 05/22/2023    9:40 AM 05/22/2023    7:47 AM  BP/Weight  Systolic BP 136 125 160 138 167 112 160  Diastolic BP 74 70 71 90 82 45 66  Wt. (Lbs) 190  189.2  187.12    BMI 37.11 kg/m2  36.95 kg/m2  34.22 kg/m2        Latest Ref Rng & Units 11/01/2023   10:00 AM 07/30/2022   12:00 AM  Foot/eye exam completion dates  Eye Exam No Retinopathy  No Retinopathy      Foot Form Completion  Done      This result is from an external source.      Updated lab needed at/ before next visit.   Hypertension goal BP (blood pressure) < 140/90 Controlled, no change in medication DASH diet and commitment to daily physical activity for a minimum of 30  minutes discussed and encouraged, as a part of hypertension management. The importance of attaining a healthy weight is also discussed.     11/01/2023   10:06 AM 09/03/2023    9:15 AM 09/03/2023    9:11 AM 08/01/2023   11:16 AM 08/01/2023   10:46 AM 05/22/2023    9:40 AM 05/22/2023    7:47 AM  BP/Weight  Systolic BP 136 125 160 138 167 112 160  Diastolic BP 74 70 71 90 82 45 66  Wt. (Lbs) 190  189.2  187.12    BMI 37.11 kg/m2  36.95 kg/m2  34.22 kg/m2         Hyperlipidemia LDL goal <100 Hyperlipidemia:Low fat diet discussed and encouraged.   Lipid Panel  Lab Results  Component Value Date   CHOL 167 10/25/2023   HDL 55 10/25/2023   LDLCALC 93 10/25/2023   TRIG 102 10/25/2023   CHOLHDL 3.0 10/25/2023     Controlled, no change in medication Updated lab needed at/ before next visit.   Hyperuricemia Updated lab needed at/ before next visit. No recent gout flares  Vitamin D  deficiency Updated lab needed at/ before next visit.   Morbid obesity (HCC)  Patient re-educated about  the importance of commitment to a  minimum of 150 minutes of exercise per week as able.  The importance of healthy food choices with portion control discussed, as well as eating regularly and within a 12 hour window most days. The need to choose clean , green food 50 to 75% of the time is discussed, as well as to make water  the primary drink and set a goal of 64 ounces water  daily.       11/01/2023   10:06 AM 09/03/2023    9:11 AM 08/01/2023   10:46 AM  Weight /BMI  Weight 190 lb 189 lb 3.2 oz 187 lb 1.9 oz  Height 5' (1.524 m) 5' (1.524 m) 5' 2 (1.575 m)  BMI 37.11 kg/m2 36.95 kg/m2 34.22 kg/m2    Unchanged

## 2023-11-03 NOTE — Assessment & Plan Note (Signed)
 Hyperlipidemia:Low fat diet discussed and encouraged.   Lipid Panel  Lab Results  Component Value Date   CHOL 167 10/25/2023   HDL 55 10/25/2023   LDLCALC 93 10/25/2023   TRIG 102 10/25/2023   CHOLHDL 3.0 10/25/2023     Controlled, no change in medication Updated lab needed at/ before next visit.

## 2023-11-03 NOTE — Assessment & Plan Note (Signed)
 Updated lab needed at/ before next visit. No recent gout flares

## 2023-11-03 NOTE — Assessment & Plan Note (Signed)
  Patient re-educated about  the importance of commitment to a  minimum of 150 minutes of exercise per week as able.  The importance of healthy food choices with portion control discussed, as well as eating regularly and within a 12 hour window most days. The need to choose clean , green food 50 to 75% of the time is discussed, as well as to make water  the primary drink and set a goal of 64 ounces water  daily.       11/01/2023   10:06 AM 09/03/2023    9:11 AM 08/01/2023   10:46 AM  Weight /BMI  Weight 190 lb 189 lb 3.2 oz 187 lb 1.9 oz  Height 5' (1.524 m) 5' (1.524 m) 5' 2 (1.575 m)  BMI 37.11 kg/m2 36.95 kg/m2 34.22 kg/m2    Unchanged

## 2023-11-03 NOTE — Assessment & Plan Note (Signed)
 Controlled, no change in medication DASH diet and commitment to daily physical activity for a minimum of 30 minutes discussed and encouraged, as a part of hypertension management. The importance of attaining a healthy weight is also discussed.     11/01/2023   10:06 AM 09/03/2023    9:15 AM 09/03/2023    9:11 AM 08/01/2023   11:16 AM 08/01/2023   10:46 AM 05/22/2023    9:40 AM 05/22/2023    7:47 AM  BP/Weight  Systolic BP 136 125 160 138 167 112 160  Diastolic BP 74 70 71 90 82 45 66  Wt. (Lbs) 190  189.2  187.12    BMI 37.11 kg/m2  36.95 kg/m2  34.22 kg/m2

## 2023-11-03 NOTE — Assessment & Plan Note (Signed)
 Updated lab needed at/ before next visit.

## 2023-11-07 ENCOUNTER — Ambulatory Visit: Payer: Self-pay

## 2023-11-07 DIAGNOSIS — R052 Subacute cough: Secondary | ICD-10-CM

## 2023-11-07 NOTE — Telephone Encounter (Signed)
 FYI Only or Action Required?: Action required by provider: Medication Request.  Patient was last seen in primary care on 11/01/2023 by Antonetta Rollene BRAVO, MD.  Called Nurse Triage reporting Cough.  Symptoms began a week ago.  Interventions attempted: Prescription medications: Z-pak.  Symptoms are: unchanged.  Triage Disposition: See PCP When Office is Open (Within 3 Days)  Patient/caregiver understands and will follow disposition?:      Copied from CRM #[id number]. Topic: Clinical - Red Word Triage >> Nov 07, 2023  1:38 PM Berwyn MATSU wrote: Red Word that prompted transfer to Nurse Triage:  chest discomfort, drainage in throat, and phlegm Reason for Disposition  Cough has been present for > 3 weeks  Answer Assessment - Initial Assessment Questions Seen last week, given a Z-pak for Acute Frontal Sinustitis, states she's not feeling better. Denies SOB, chest pain. Patient denied office visit since she was just there 8/22. Would like Dr. Antonetta to send in something to pharmacy.   1. ONSET: When did the cough begin?      Last week  3. SPUTUM: Describe the color of your sputum (e.g., none, dry cough; clear, white, yellow, green)     Green  4. HEMOPTYSIS: Are you coughing up any blood? If Yes, ask: How much? (e.g., flecks, streaks, tablespoons, etc.)     No  5. DIFFICULTY BREATHING: Are you having difficulty breathing? If Yes, ask: How bad is it? (e.g., mild, moderate, severe)      No   6. FEVER: Do you have a fever? If Yes, ask: What is your temperature, how was it measured, and when did it start?     Unknown  10. OTHER SYMPTOMS: Do you have any other symptoms? (e.g., runny nose, wheezing, chest pain)       Denies any other symptoms.  Protocols used: Cough - Acute Productive-A-AH

## 2023-11-08 ENCOUNTER — Other Ambulatory Visit: Payer: Self-pay | Admitting: Family Medicine

## 2023-11-08 MED ORDER — DOXYCYCLINE HYCLATE 100 MG PO TABS: 100.0000 mg | ORAL_TABLET | Freq: Two times a day (BID) | ORAL | 0 refills | Status: DC

## 2023-11-08 MED ORDER — DOXYCYCLINE HYCLATE 100 MG PO TABS: 100.0000 mg | ORAL_TABLET | Freq: Two times a day (BID) | ORAL | 0 refills | Status: AC

## 2023-11-08 MED ORDER — BENZONATATE 200 MG PO CAPS
200.0000 mg | ORAL_CAPSULE | Freq: Two times a day (BID) | ORAL | 0 refills | Status: AC | PRN
Start: 2023-11-08 — End: ?

## 2023-11-08 NOTE — Telephone Encounter (Signed)
 I SPOKE DIRECTLY WITH PT, REPORTS ONGOING SYMPTOMS, ALWAYS HAS CHILLS NO DOCUMENTED FEVER WILL GWET cxr NEXT WEDNESDAY.  DOXYCYCLINE  AND TESSALON  PERLES ARE PRESCRIBED NEEDS IN OFFICE EVAL IF SYMPTOMS WORSEN OR FAIL TO RESOLVE SHE UNDERSTANDS AND AGREES

## 2023-11-08 NOTE — Addendum Note (Signed)
 Addended by: ANTONETTA ROLLENE BRAVO on: 11/08/2023 09:18 AM   Modules accepted: Orders

## 2023-11-13 ENCOUNTER — Ambulatory Visit (HOSPITAL_COMMUNITY)
Admission: RE | Admit: 2023-11-13 | Discharge: 2023-11-13 | Disposition: A | Discharge status: Home / Self Care | Source: Ambulatory Visit | Attending: Family Medicine | Admitting: Family Medicine

## 2023-11-13 DIAGNOSIS — R059 Cough, unspecified: Secondary | ICD-10-CM | POA: Diagnosis not present

## 2023-11-13 DIAGNOSIS — R052 Subacute cough: Secondary | ICD-10-CM | POA: Insufficient documentation

## 2023-11-13 DIAGNOSIS — R0989 Other specified symptoms and signs involving the circulatory and respiratory systems: Secondary | ICD-10-CM | POA: Diagnosis not present

## 2023-11-14 ENCOUNTER — Ambulatory Visit: Payer: Self-pay | Admitting: Family Medicine

## 2023-11-26 ENCOUNTER — Ambulatory Visit: Payer: Self-pay

## 2023-11-26 NOTE — Telephone Encounter (Signed)
 Called patient she will do mychart video Thursday

## 2023-11-26 NOTE — Telephone Encounter (Signed)
 FYI Only or Action Required?: FYI only for provider.  Patient was last seen in primary care on 11/01/2023 by Antonetta Rollene BRAVO, MD.  Called Nurse Triage reporting Cough.  Symptoms began several weeks ago.  Interventions attempted: Antibiotics.  Symptoms are: gradually worsening  Triage Disposition: See HCP Within 4 Hours (Or PCP Triage)  Patient/caregiver understands and will follow disposition?: Yes, appt availability, pt agreeable to proceed to UC         Copied from CRM #[id number]. Topic: Clinical - Red Word Triage >> Nov 26, 2023  8:28 AM [name] wrote: Red Word that prompted transfer to Nurse Triage: Difficulty breathing, cough is choking her. Has completed 2 rounds of antibiotics, no relief. Seeking appt. Reason for Disposition  Wheezing is present  Answer Assessment - Initial Assessment Questions 1. ONSET: When did the cough begin?      Ongoing, worsening after abx 3. SPUTUM: Describe the color of your sputum (e.g., none, dry cough; clear, white, yellow, green)     White 4. HEMOPTYSIS: Are you coughing up any blood? If Yes, ask: How much? (e.g., flecks, streaks, tablespoons, etc.)     None 5. DIFFICULTY BREATHING: Are you having difficulty breathing? If Yes, ask: How bad is it? (e.g., mild, moderate, severe)      SOB with coughing fits 6. FEVER: Do you have a fever? If Yes, ask: What is your temperature, how was it measured, and when did it start?     None 7. CARDIAC HISTORY: Do you have any history of heart disease? (e.g., heart attack, congestive heart failure)      HTN 8. LUNG HISTORY: Do you have any history of lung disease?  (e.g., pulmonary embolus, asthma, emphysema)     Bronchitis, asthma 10. OTHER SYMPTOMS: Do you have any other symptoms? (e.g., runny nose, wheezing, chest pain)       Nasal congestion  Protocols used: Cough - Acute Productive-A-AH

## 2023-11-28 ENCOUNTER — Telehealth (INDEPENDENT_AMBULATORY_CARE_PROVIDER_SITE_OTHER): Admitting: Family Medicine

## 2023-11-28 DIAGNOSIS — J069 Acute upper respiratory infection, unspecified: Secondary | ICD-10-CM

## 2023-11-28 MED ORDER — TRIAMCINOLONE ACETONIDE 55 MCG/ACT NA AERO: 2.0000 | INHALATION_SPRAY | Freq: Every day | NASAL | 12 refills | Status: AC

## 2023-11-28 MED ORDER — PROMETHAZINE-DM 6.25-15 MG/5ML PO SYRP: 5.0000 mL | ORAL_SOLUTION | Freq: Three times a day (TID) | ORAL | 0 refills | Status: AC | PRN

## 2023-11-28 MED ORDER — LEVOFLOXACIN 500 MG PO TABS: 500.0000 mg | ORAL_TABLET | Freq: Every day | ORAL | 0 refills | Status: AC

## 2023-11-28 NOTE — Assessment & Plan Note (Signed)
 Trial on Levofloxacin  (Levaquin ) - 500 mg once daily for 7 days Nasacort  nasal spray twice daily Advise patient to rest to support your body's recovery. Stay hydrated by drinking water , tea, or broth. Using a humidifier can help soothe throat irritation and ease nasal congestion. For fever or pain, acetaminophen  (Tylenol ) is recommended. To relieve other symptoms, try saline nasal sprays, throat lozenges, or gargling with saltwater. Focus on eating light, healthy meals like fruits and vegetables to keep your strength up. Practice good hygiene by washing your hands frequently and covering your mouth when coughing or sneezing.Follow-up for worsening or persistent symptoms. Patient verbalizes understanding regarding plan of care and all questions answered

## 2023-11-28 NOTE — Progress Notes (Signed)
 Virtual Visit via Video Note  I connected with Jacara W Leyendecker on 11/28/23 at 10:00 AM EDT by a video enabled telemedicine application and verified that I am speaking with the correct person using two identifiers.  Patient Location: Home Provider Location: Office/Clinic  I discussed the limitations, risks, security, and privacy concerns of performing an evaluation and management service by video and the availability of in person appointments. I also discussed with the patient that there may be a patient responsible charge related to this service. The patient expressed understanding and agreed to proceed.  Subjective: PCP: Antonetta Rollene BRAVO, MD  No chief complaint on file.  This is a chronic cough that has been waxing and waning, productive of purulent sputum. Associated symptoms include postnasal drip and shortness of breath, with no fever or wheezing reported. The patient has tried OTC cough suppressants without relief and recently completed courses of azithromycin  and doxycycline  with no improvement. A recent chest X-ray was negative. Past medical history is significant for bronchitis and pneumonia    ROS: Per HPI  Current Outpatient Medications:    ACCU-CHEK GUIDE TEST test strip, 1 EACH BY IN VITRO ROUTE IN THE MORNING, AT NOON, AND AT BEDTIME. MAY SUBSTITUTE TO ANY MANUFACTURER COVERED BY PATIENT'S INSURANCE. DX: E11.65, Disp: 100 strip, Rfl: 2   allopurinol  (ZYLOPRIM ) 100 MG tablet, TAKE 1 TABLET BY MOUTH DAILY, Disp: 100 tablet, Rfl: 2   aspirin EC 81 MG tablet, Take 81 mg by mouth daily., Disp: , Rfl:    azelastine  (ASTELIN ) 0.1 % nasal spray, Place into both nostrils 2 (two) times daily. Use in each nostril as directed, Disp: , Rfl:    benzonatate  (TESSALON ) 200 MG capsule, Take 1 capsule (200 mg total) by mouth 2 (two) times daily as needed for cough., Disp: 20 capsule, Rfl: 0   Blood Glucose Monitoring Suppl DEVI, 1 each by Does not apply route in the morning, at noon, and at  bedtime. May substitute to any manufacturer covered by patient's insurance. DX e11.65, Disp: 1 each, Rfl: 0   Blood Pressure KIT, 1 each by Does not apply route daily as needed. BP monitor machine and bp cuff. May substitute for any brand covered by insurance if any. DX: I10, Disp: 1 kit, Rfl: 0   carvedilol  (COREG ) 6.25 MG tablet, Take 6.25 mg by mouth 2 (two) times daily., Disp: , Rfl:    cetirizine  (ZYRTEC ) 10 MG tablet, Take 1 tablet (10 mg total) by mouth daily., Disp: 90 tablet, Rfl: 3   Cholecalciferol (VITAMIN D3) 50 MCG (2000 UT) capsule, Take 2,000 Units by mouth daily., Disp: , Rfl:    doxycycline  (VIBRA -TABS) 100 MG tablet, Take 1 tablet (100 mg total) by mouth 2 (two) times daily., Disp: 20 tablet, Rfl: 0   fluticasone  (FLONASE ) 50 MCG/ACT nasal spray, USE 2 SPRAYS IN BOTH NOSTRILS  DAILY, Disp: 32 g, Rfl: 2   glipiZIDE  (GLUCOTROL ) 10 MG tablet, TAKE 1 TABLET BY MOUTH TWICE  DAILY BEFORE MEALS, Disp: 200 tablet, Rfl: 2   Lancets (ONETOUCH DELICA PLUS LANCET33G) MISC, USE 1 EACH IN THE MORNING , AT  NOON, AND AT BEDTIME, Disp: 300 each, Rfl: 2   metFORMIN  (GLUCOPHAGE ) 1000 MG tablet, TAKE 1 TABLET BY MOUTH TWICE  DAILY WITH MEALS, Disp: 200 tablet, Rfl: 2   montelukast  (SINGULAIR ) 10 MG tablet, TAKE 1 TABLET BY MOUTH AT  BEDTIME, Disp: 100 tablet, Rfl: 2   NIFEdipine  (ADALAT  CC) 90 MG 24 hr tablet, TAKE 1 TABLET BY  MOUTH DAILY, Disp: 100 tablet, Rfl: 2   olmesartan  (BENICAR ) 40 MG tablet, TAKE 1 TABLET BY MOUTH DAILY, Disp: 100 tablet, Rfl: 2   pantoprazole  (PROTONIX ) 40 MG tablet, TAKE 1 TABLET BY MOUTH DAILY  BEFORE BREAKFAST, Disp: 90 tablet, Rfl: 3   RESTASIS 0.05 % ophthalmic emulsion, 1 drop 2 (two) times daily. (Patient not taking: Reported on 11/01/2023), Disp: , Rfl:    rosuvastatin  (CRESTOR ) 5 MG tablet, Take one tablet by mouth every Monday and Friday, Disp: 26 tablet, Rfl: 4   spironolactone  (ALDACTONE ) 100 MG tablet, TAKE 1 TABLET BY MOUTH DAILY, Disp: 100 tablet, Rfl:  2  Observations/Objective: There were no vitals filed for this visit. Physical Exam Patient is alert and no acute distress noted.   Assessment and Plan: There are no diagnoses linked to this encounter.  Follow Up Instructions: No follow-ups on file.   I discussed the assessment and treatment plan with the patient. The patient was provided an opportunity to ask questions, and all were answered. The patient agreed with the plan and demonstrated an understanding of the instructions.   The patient was advised to call back or seek an in-person evaluation if the symptoms worsen or if the condition fails to improve as anticipated.  The above assessment and management plan was discussed with the patient. The patient verbalized understanding of and has agreed to the management plan.   Hilario Kidd Wilhelmena Falter, FNP

## 2023-11-29 ENCOUNTER — Ambulatory Visit

## 2023-12-18 ENCOUNTER — Encounter: Payer: Self-pay | Admitting: Family Medicine

## 2023-12-21 ENCOUNTER — Other Ambulatory Visit: Payer: Self-pay | Admitting: Internal Medicine

## 2023-12-24 ENCOUNTER — Ambulatory Visit (INDEPENDENT_AMBULATORY_CARE_PROVIDER_SITE_OTHER)

## 2023-12-24 DIAGNOSIS — Z23 Encounter for immunization: Secondary | ICD-10-CM | POA: Diagnosis not present

## 2023-12-24 NOTE — Progress Notes (Signed)
 Patient is in office today for a nurse visit for Immunization. Patient Injection was given in the  Left deltoid. Patient tolerated injection well.

## 2024-02-10 ENCOUNTER — Ambulatory Visit: Payer: Medicare Other

## 2024-02-10 VITALS — Ht 60.0 in | Wt 190.0 lb

## 2024-02-10 DIAGNOSIS — Z0000 Encounter for general adult medical examination without abnormal findings: Secondary | ICD-10-CM

## 2024-02-10 DIAGNOSIS — R234 Changes in skin texture: Secondary | ICD-10-CM

## 2024-02-10 NOTE — Patient Instructions (Addendum)
 Michaela Peterson,  Thank you for taking the time for your Medicare Wellness Visit. I appreciate your continued commitment to your health goals. Please review the care plan we discussed, and feel free to reach out if I can assist you further.  Please note that Annual Wellness Visits do not include a physical exam. Some assessments may be limited, especially if the visit was conducted virtually. If needed, we may recommend an in-person follow-up with your provider.  Ongoing Care Seeing your primary care provider every 3 to 6 months helps us  monitor your health and provide consistent, personalized care.   1 yr follow up with Medicare Wellness Nurse: February 10, 2025 2026 at 10:40am in person  Referrals If a referral was made during today's visit and you haven't received any updates within two weeks, please contact the referred provider directly to check on the status.  [facility] [address]  [address] [place] [address] Phone: [phone]  Recommended Screenings:  Health Maintenance  Topic Date Due   COVID-19 Vaccine (6 - 2025-26 season) 11/11/2023   DTaP/Tdap/Td vaccine (3 - Td or Tdap) 12/24/2023   Medicare Annual Wellness Visit  02/06/2024   Hemoglobin A1C  04/26/2024   Yearly kidney function blood test for diabetes  07/16/2024   Yearly kidney health urinalysis for diabetes  07/31/2024   Eye exam for diabetics  08/05/2024   Breast Cancer Screening  10/17/2024   Complete foot exam   10/31/2024   Osteoporosis screening with Bone Density Scan  04/15/2025   Colon Cancer Screening  05/21/2028   Pneumococcal Vaccine for age over 24  Completed   Flu Shot  Completed   Hepatitis C Screening  Completed   Zoster (Shingles) Vaccine  Completed   Meningitis B Vaccine  Aged Out       05/22/2023    7:45 AM  Advanced Directives  Does Patient Have a Medical Advance Directive? No  Would patient like information on creating a medical advance directive? No - Patient declined     Vision: Annual vision screenings are recommended for early detection of glaucoma, cataracts, and diabetic retinopathy. These exams can also reveal signs of chronic conditions such as diabetes and high blood pressure.  Dental: Annual dental screenings help detect early signs of oral cancer, gum disease, and other conditions linked to overall health, including heart disease and diabetes.  Please see the attached documents for additional preventive care recommendations.

## 2024-02-10 NOTE — Progress Notes (Addendum)
 I connected with  Michaela Peterson on 02/10/24 by a audio enabled telemedicine application and verified that I am speaking with the correct person using two identifiers.  Patient Location: Home  Provider Location: Office/Clinic  I discussed the limitations of evaluation and management by telemedicine. The patient expressed understanding and agreed to proceed.  Chief Complaint  Patient presents with   Medicare Wellness    Subjective:   Michaela Peterson is a 68 y.o. female who presents for a Medicare Annual Wellness Visit.  Allergies (verified) Gabapentin, Ace inhibitors, Acetaminophen , Albumin human, Aspirin, Oxycodone, Penicillins, Percocet [oxycodone-acetaminophen ], Rebif [interferon beta-1a], and Statins   History: Past Medical History:  Diagnosis Date   Adnexal mass    Left; followed by Dr. Edsel   Anemia, iron  deficiency    Arthritis    Chronic low back pain    Diabetes mellitus, type 2 (HCC)    Essential hypertension    Gastroparesis    GERD (gastroesophageal reflux disease)    MS (multiple sclerosis)    Nonalcoholic fatty liver disease    Obesity    Sleep apnea    Past Surgical History:  Procedure Laterality Date   AGILE CAPSULE N/A 08/11/2014   Procedure: AGILE CAPSULE;  Surgeon: Lamar CHRISTELLA Hollingshead, MD;  [facility]: [facility];  Service: Endoscopy;  Laterality: N/A;  0700   BALLOON DILATION N/A 03/07/2021   Procedure: BALLOON DILATION;  Surgeon: Cindie Carlin POUR, DO;  Location: [facility];  Service: Endoscopy;  Laterality: N/A;   BIOPSY  03/07/2021   Procedure: BIOPSY;  Surgeon: Cindie Carlin POUR, DO;  Location: [facility];  Service: Endoscopy;;   BLADDER SUSPENSION     CESAREAN SECTION     x2    CHOLECYSTECTOMY     COLONOSCOPY  2010   Dr. Hollingshead: tubular adenoma, few scattered diverticula   COLONOSCOPY N/A 10/01/2013   Dr. Carlo preparation. Normal rectum. Normal colonic mucosa   COLONOSCOPY N/A 11/12/2018   sigmoid and descending colon  diverticulosis. Two 4-5 mm polyps in ascending colon. (tubular adenomas). Surveillance in 5 years   COLONOSCOPY N/A 05/22/2023   Procedure: COLONOSCOPY;  Surgeon: Hollingshead Lamar CHRISTELLA, MD;  Location: [facility];  Service: Endoscopy;  Laterality: N/A;   DILATION AND CURETTAGE OF UTERUS     4   ESOPHAGOGASTRODUODENOSCOPY  2010   Dr. Hollingshead: normal esophagus, small hiatal hernia, questionable pale duodenal mucosa but negative for celiac sprue.    ESOPHAGOGASTRODUODENOSCOPY N/A 10/01/2013   Dr. Rourk:gastric polyps-status post biopsy. Otherwise, normal EGD. fundic gland polyp, negative H.pylori   ESOPHAGOGASTRODUODENOSCOPY (EGD) WITH PROPOFOL  N/A 03/07/2021   Procedure: ESOPHAGOGASTRODUODENOSCOPY (EGD) WITH PROPOFOL ;  Surgeon: Cindie Carlin POUR, DO;  Location: [facility];  Service: Endoscopy;  Laterality: N/A;  12:00pm   GIVENS CAPSULE STUDY N/A 09/08/2014   Poor prep but overall unremarkable    KNEE ARTHROSCOPY W/ DEBRIDEMENT     LAPAROSCOPY ABDOMEN DIAGNOSTIC     lysis of adhesions   POLYPECTOMY  11/12/2018   Procedure: POLYPECTOMY;  Surgeon: Hollingshead Lamar CHRISTELLA, MD;  Location: [facility];  Service: Endoscopy;;   ROTATOR CUFF REPAIR     TENDON LENGTHENING     Left wrist   UMBILICAL HERNIA REPAIR     Family History  Problem Relation Age of Onset   Heart failure Mother    Hypertension Mother    Arthritis Mother    Stroke Father    Hypertension Sister    Diabetes Sister    Heart disease  Sister    Kidney failure Sister    Hypertension Sister    Hypertension Brother    Mental illness Brother    Hypertension Son    Colon cancer Maternal Aunt    Social History   Occupational History   Occupation: unemployed     Associate Professor: UNEMPLOYED  Tobacco Use   Smoking status: Never   Smokeless tobacco: Never   Tobacco comments:    patient lives with a smoker   Vaping Use   Vaping status: Never Used  Substance and Sexual Activity   Alcohol use: No   Drug use: No   Sexual activity: Yes     Birth control/protection: Post-menopausal   Tobacco Counseling Counseling given: Yes Tobacco comments: patient lives with a smoker   SDOH Screenings   Food Insecurity: No Food Insecurity (02/10/2024)  Housing: Low Risk  (02/10/2024)  Transportation Needs: No Transportation Needs (02/10/2024)  Utilities: Not At Risk (02/10/2024)  Alcohol Screen: Low Risk  (02/06/2023)  Depression (PHQ2-9): Low Risk  (02/10/2024)  Financial Resource Strain: Low Risk  (03/28/2023)  Physical Activity: Patient Declined (02/10/2024)  Social Connections: Socially Integrated (02/10/2024)  Stress: No Stress Concern Present (02/10/2024)  Tobacco Use: Low Risk  (02/10/2024)  Health Literacy: Adequate Health Literacy (02/10/2024)   See flowsheets for full screening details  Depression Screen PHQ 2 & 9 Depression Scale- Over the past 2 weeks, how often have you been bothered by any of the following problems? Little interest or pleasure in doing things: 0 Feeling down, depressed, or hopeless (PHQ Adolescent also includes...irritable): 0 PHQ-2 Total Score: 0     Goals Addressed             This Visit's Progress    Exercise 3x per week (30 min per time)   On track      Visit info / Clinical Intake: Medicare Wellness Visit Type:: Subsequent Annual Wellness Visit Persons participating in visit:: patient Medicare Wellness Visit Mode:: Telephone If telephone:: video declined Because this visit was a virtual/telehealth visit:: pt reported vitals If Telephone or Video please confirm:: The patient expressed understanding and agreed to proceed; I connected with the patient using audio/video enabled telemedicine application and verified that I am speaking with the correct person using two identifiers; I discussed the limitations of evaluation and management by telemedicine Patient Location:: home Provider Location:: office Information given by:: patient Interpreter Needed?: No Pre-visit prep was completed: yes AWV  questionnaire completed by patient prior to visit?: no Living arrangements:: lives with spouse/significant other Patient's Overall Health Status Rating: (!) fair Typical amount of pain: some Does pain affect daily life?: (!) yes (per patient, sometimes) Are you currently prescribed opioids?: no  Dietary Habits and Nutritional Risks How many meals a day?: 2 Eats fruit and vegetables daily?: yes Most meals are obtained by: preparing own meals In the last 2 weeks, have you had any of the following?: none Diabetic:: (!) yes Any non-healing wounds?: no How often do you check your BS?: 1 Would you like to be referred to a Nutritionist or for Diabetic Management? : no  Functional Status Activities of Daily Living (to include ambulation/medication): Independent Ambulation: Independent with device- listed below Home Assistive Devices/Equipment: Walker (specify Type) Medication Administration: Independent Home Management: Independent Manage your own finances?: yes Primary transportation is: driving Concerns about vision?: no *vision screening is required for WTM* Concerns about hearing?: no  Fall Screening Falls in the past year?: 0 Number of falls in past year: 0 Was there an injury  with Fall?: 0 Fall Risk Category Calculator: 0 Patient Fall Risk Level: Low Fall Risk  Fall Risk Patient at Risk for Falls Due to: Impaired mobility Fall risk Follow up: Falls evaluation completed; Education provided; Falls prevention discussed  Home and Transportation Safety: All rugs have non-skid backing?: (!) no All stairs or steps have railings?: yes Grab bars in the bathtub or shower?: yes Have non-skid surface in bathtub or shower?: yes Good home lighting?: yes Regular seat belt use?: (!) no Hospital stays in the last year:: no  Cognitive Assessment Difficulty concentrating, remembering, or making decisions? : no Will 6CIT or Mini Cog be Completed: no 6CIT or Mini Cog Declined: patient  alert, oriented, able to answer questions appropriately and recall recent events  Advance Directives (For Healthcare) Does Patient Have a Medical Advance Directive?: No Would patient like information on creating a medical advance directive?: No - Patient declined  Reviewed/Updated  Reviewed/Updated: Reviewed All (Medical, Surgical, Family, Medications, Allergies, Care Teams, Patient Goals)        Objective:    Today's Vitals   02/10/24 1027  Weight: 190 lb (86.2 kg)  Height: 5' (1.524 m)   Body mass index is 37.11 kg/m.  Current Medications (verified) Outpatient Encounter Medications as of 02/10/2024  Medication Sig   ACCU-CHEK GUIDE TEST test strip 1 EACH BY IN VITRO ROUTE IN THE MORNING, AT NOON, AND AT BEDTIME. MAY SUBSTITUTE TO ANY MANUFACTURER COVERED BY PATIENT'S INSURANCE. DX: E11.65   allopurinol  (ZYLOPRIM ) 100 MG tablet TAKE 1 TABLET BY MOUTH DAILY   aspirin EC 81 MG tablet Take 81 mg by mouth daily.   azelastine  (ASTELIN ) 0.1 % nasal spray Place into both nostrils 2 (two) times daily. Use in each nostril as directed   benzonatate  (TESSALON ) 200 MG capsule Take 1 capsule (200 mg total) by mouth 2 (two) times daily as needed for cough.   carvedilol  (COREG ) 6.25 MG tablet Take 6.25 mg by mouth 2 (two) times daily.   cetirizine  (ZYRTEC ) 10 MG tablet Take 1 tablet (10 mg total) by mouth daily.   Cholecalciferol (VITAMIN D3) 50 MCG (2000 UT) capsule Take 2,000 Units by mouth daily.   doxycycline  (VIBRA -TABS) 100 MG tablet Take 1 tablet (100 mg total) by mouth 2 (two) times daily.   fluticasone  (FLONASE ) 50 MCG/ACT nasal spray USE 2 SPRAYS IN BOTH NOSTRILS  DAILY   glipiZIDE  (GLUCOTROL ) 10 MG tablet TAKE 1 TABLET BY MOUTH TWICE  DAILY BEFORE MEALS   metFORMIN  (GLUCOPHAGE ) 1000 MG tablet TAKE 1 TABLET BY MOUTH TWICE  DAILY WITH MEALS   montelukast  (SINGULAIR ) 10 MG tablet TAKE 1 TABLET BY MOUTH AT  BEDTIME   NIFEdipine  (ADALAT  CC) 90 MG 24 hr tablet TAKE 1 TABLET BY MOUTH DAILY    olmesartan  (BENICAR ) 40 MG tablet TAKE 1 TABLET BY MOUTH DAILY   pantoprazole  (PROTONIX ) 40 MG tablet TAKE 1 TABLET BY MOUTH DAILY  BEFORE BREAKFAST   promethazine -dextromethorphan (PROMETHAZINE -DM) 6.25-15 MG/5ML syrup Take 5 mLs by mouth 3 (three) times daily as needed for cough.   RESTASIS 0.05 % ophthalmic emulsion 1 drop 2 (two) times daily.   rosuvastatin  (CRESTOR ) 5 MG tablet Take one tablet by mouth every Monday and Friday   spironolactone  (ALDACTONE ) 100 MG tablet TAKE 1 TABLET BY MOUTH DAILY   triamcinolone  (NASACORT ) 55 MCG/ACT AERO nasal inhaler Place 2 sprays into the nose daily.   [DISCONTINUED] Blood Glucose Monitoring Suppl DEVI 1 each by Does not apply route in the morning, at noon, and at  bedtime. May substitute to any manufacturer covered by patient's insurance. DX e11.65   [DISCONTINUED] Blood Pressure KIT 1 each by Does not apply route daily as needed. BP monitor machine and bp cuff. May substitute for any brand covered by insurance if any. DX: I10   [DISCONTINUED] Lancets (ONETOUCH DELICA PLUS LANCET33G) MISC USE 1 EACH IN THE MORNING , AT  NOON, AND AT BEDTIME   No facility-administered encounter medications on file as of 02/10/2024.   Hearing/Vision screen Hearing Screening - Comments:: Patient denies any hearing difficulties.   Vision Screening - Comments:: Wears rx glasses - up to date with routine eye exams with  Oneil Kawasaki  Immunizations and Health Maintenance Health Maintenance  Topic Date Due   COVID-19 Vaccine (6 - 2025-26 season) 11/11/2023   DTaP/Tdap/Td (3 - Td or Tdap) 12/24/2023   Medicare Annual Wellness (AWV)  02/06/2024   HEMOGLOBIN A1C  04/26/2024   Diabetic kidney evaluation - eGFR measurement  07/16/2024   Diabetic kidney evaluation - Urine ACR  07/31/2024   OPHTHALMOLOGY EXAM  08/05/2024   Mammogram  10/17/2024   FOOT EXAM  10/31/2024   Bone Density Scan  04/15/2025   Colonoscopy  05/21/2028   Pneumococcal Vaccine: 50+ Years  Completed    Influenza Vaccine  Completed   Hepatitis C Screening  Completed   Zoster Vaccines- Shingrix  Completed   Meningococcal B Vaccine  Aged Out        Assessment/Plan:  This is a routine wellness examination for Michaela Peterson.  Patient Care Team: Antonetta Rollene BRAVO, MD as PCP - General (Family Medicine) Shaaron, Lamar HERO, MD as Consulting Physician (Gastroenterology) Jane Lamar, MD as Consulting Physician (Orthopedic Surgery) Rachele Gaynell RAMAN, MD as Consulting Physician (Nephrology) Debera Jayson MATSU, MD as Consulting Physician (Cardiology)  I have personally reviewed and noted the following in the patient's chart:   Medical and social history Use of alcohol, tobacco or illicit drugs  Current medications and supplements including opioid prescriptions. Functional ability and status Nutritional status Physical activity Advanced directives List of other physicians Hospitalizations, surgeries, and ER visits in previous 12 months Vitals Screenings to include cognitive, depression, and falls Referrals and appointments  No orders of the defined types were placed in this encounter.  In addition, I have reviewed and discussed with patient certain preventive protocols, quality metrics, and best practice recommendations. A written personalized care plan for preventive services as well as general preventive health recommendations were provided to patient.   Dylann Layne, CMA   02/10/2024   No follow-ups on file.  After Visit Summary: (Mail) Due to this being a telephonic visit, the after visit summary with patients personalized plan was offered to patient via mail   Nurse Notes: dermatology referral placed.

## 2024-02-27 ENCOUNTER — Telehealth: Payer: Self-pay

## 2024-02-27 NOTE — Telephone Encounter (Signed)
 Patient was identified as falling into the [place] Measure - Diabetes.   Patient was: Appointment already scheduled for:  03/27/2024.

## 2024-03-12 ENCOUNTER — Encounter: Payer: Self-pay | Admitting: Gastroenterology

## 2024-03-25 ENCOUNTER — Other Ambulatory Visit: Payer: Self-pay | Admitting: Family Medicine

## 2024-03-27 ENCOUNTER — Ambulatory Visit: Payer: Self-pay | Admitting: Family Medicine

## 2024-05-06 ENCOUNTER — Ambulatory Visit: Admitting: Family Medicine

## 2025-02-10 ENCOUNTER — Ambulatory Visit
